# Patient Record
Sex: Male | Born: 1960 | Race: Black or African American | Hispanic: No | Marital: Married | Smoking: Current every day smoker
Health system: Southern US, Community
[De-identification: ages and names within clinical notes are randomized; demographics above are authoritative.]

## PROBLEM LIST (undated history)

## (undated) DIAGNOSIS — C801 Malignant (primary) neoplasm, unspecified: Secondary | ICD-10-CM

## (undated) DIAGNOSIS — I10 Essential (primary) hypertension: Secondary | ICD-10-CM

## (undated) DIAGNOSIS — E785 Hyperlipidemia, unspecified: Secondary | ICD-10-CM

## (undated) DIAGNOSIS — K759 Inflammatory liver disease, unspecified: Secondary | ICD-10-CM

## (undated) DIAGNOSIS — K219 Gastro-esophageal reflux disease without esophagitis: Secondary | ICD-10-CM

## (undated) DIAGNOSIS — F32A Depression, unspecified: Secondary | ICD-10-CM

## (undated) DIAGNOSIS — R0600 Dyspnea, unspecified: Secondary | ICD-10-CM

## (undated) DIAGNOSIS — J449 Chronic obstructive pulmonary disease, unspecified: Secondary | ICD-10-CM

## (undated) HISTORY — PX: CARDIAC CATHETERIZATION: SHX172

## (undated) HISTORY — DX: Inflammatory liver disease, unspecified: K75.9

## (undated) HISTORY — DX: Gastro-esophageal reflux disease without esophagitis: K21.9

## (undated) HISTORY — DX: Chronic obstructive pulmonary disease, unspecified: J44.9

## (undated) MED FILL — Fosaprepitant Dimeglumine For IV Infusion 150 MG (Base Eq): INTRAVENOUS | Qty: 5 | Status: AC

---

## 1983-08-09 HISTORY — PX: WRIST GANGLION EXCISION: SUR520

## 2004-03-12 ENCOUNTER — Ambulatory Visit (HOSPITAL_COMMUNITY): Admission: RE | Admit: 2004-03-12 | Discharge: 2004-03-12 | Payer: Self-pay | Admitting: Pediatrics

## 2004-03-22 ENCOUNTER — Ambulatory Visit (HOSPITAL_COMMUNITY): Admission: RE | Admit: 2004-03-22 | Discharge: 2004-03-22 | Payer: Self-pay | Admitting: *Deleted

## 2004-05-10 ENCOUNTER — Ambulatory Visit (HOSPITAL_COMMUNITY): Admission: RE | Admit: 2004-05-10 | Discharge: 2004-05-10 | Payer: Self-pay | Admitting: Family Medicine

## 2004-05-10 IMAGING — US US ABDOMEN COMPLETE
1 series · 14 of 25 positions shown · non-contrast
Comparison: none

CLINICAL DATA: Elevated liver enzymes.  Alcohol abuse.  
ABDOMINAL ULTRASOUND, [DATE]

[Series 1: unknown · 0.34mm/px · 14 of 67 slices shown]
[im 1/67]
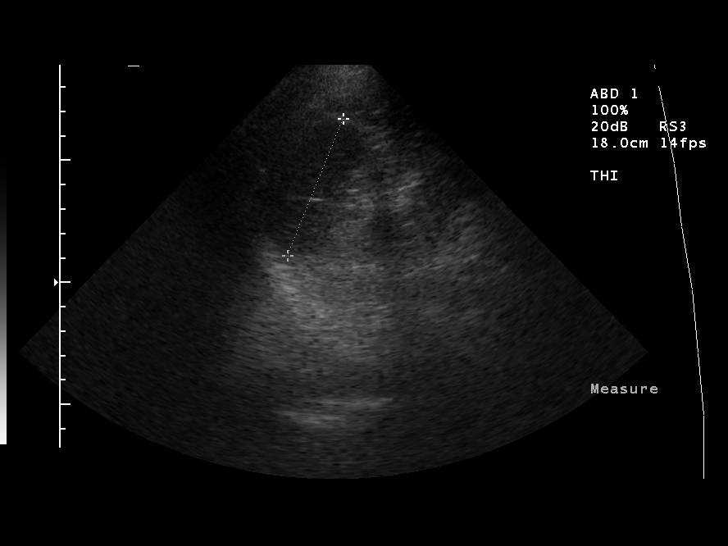
[im 6/67]
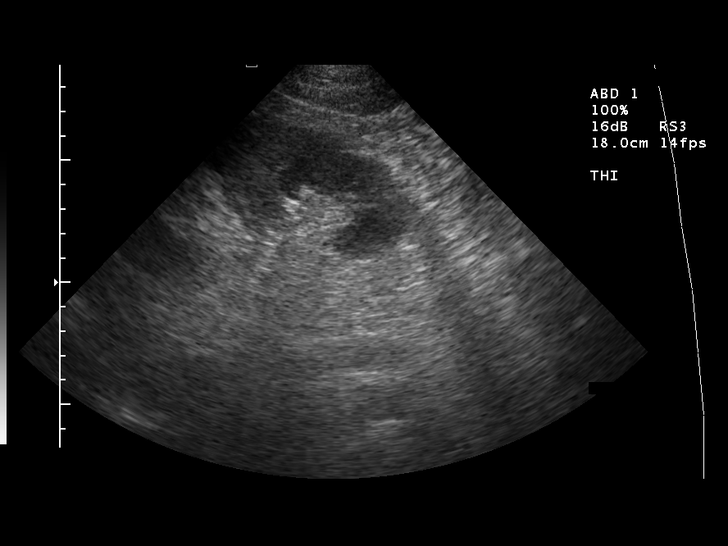
[im 12/67]
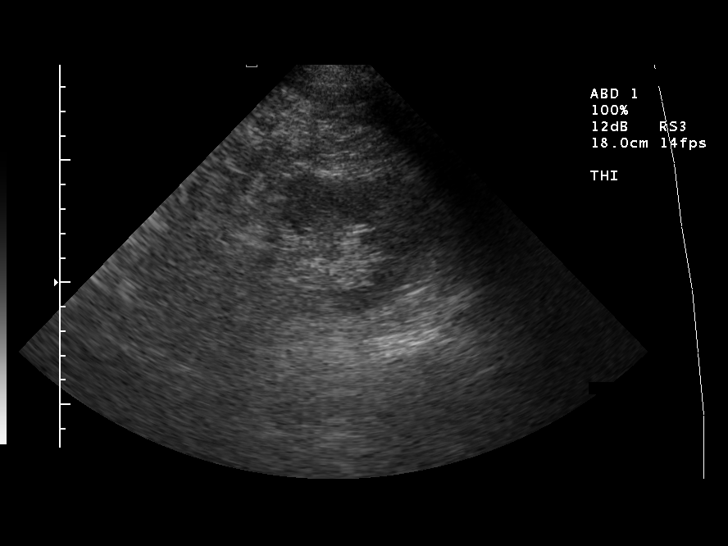
[im 17/67]
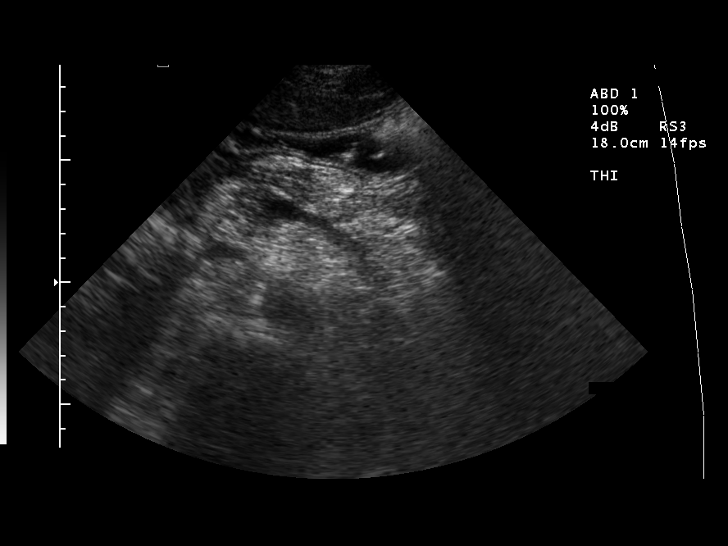
[im 23/67]
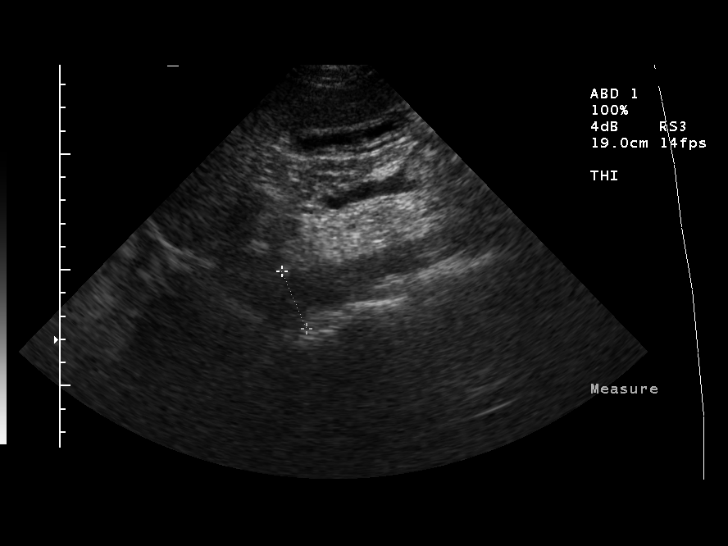
[im 25/67]
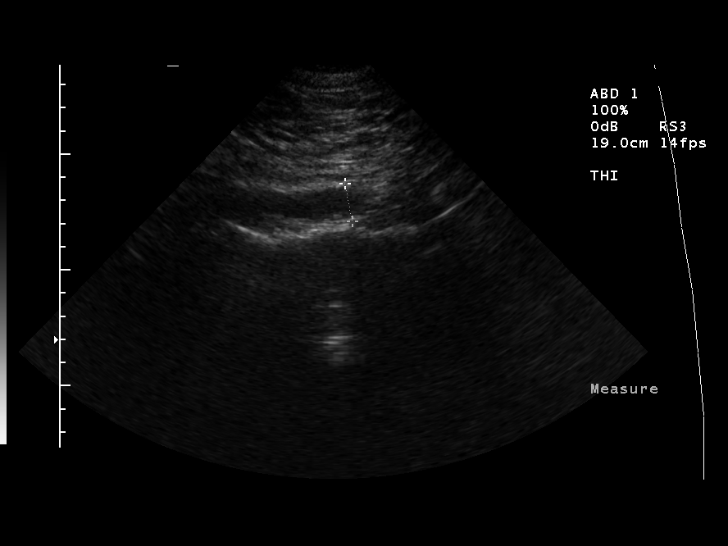
[im 31/67]
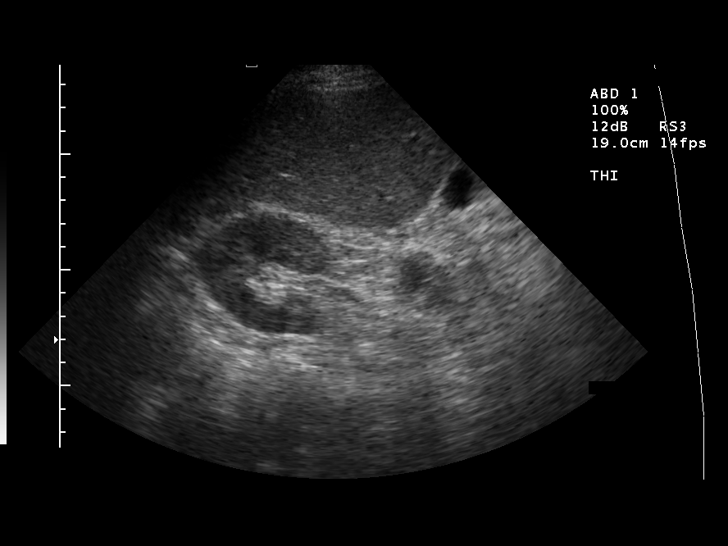
[im 36/67]
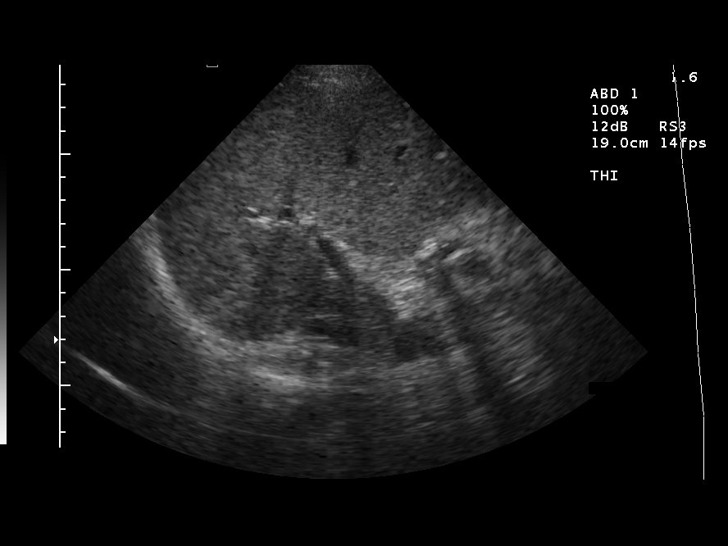
[im 42/67]
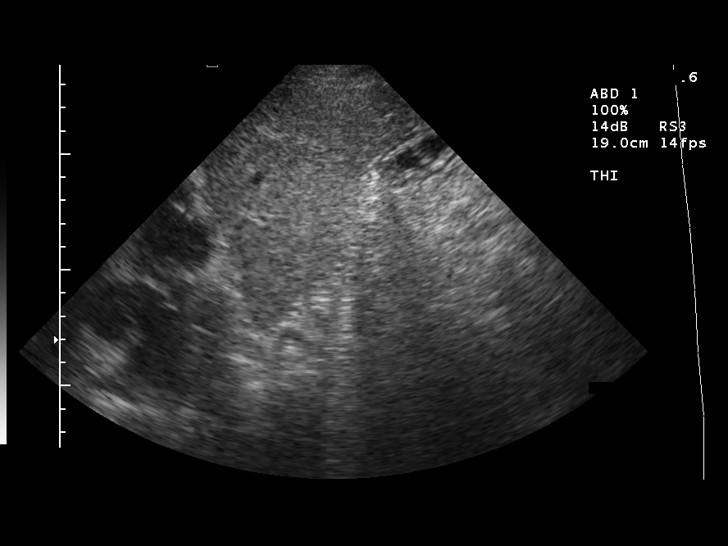
[im 45/67]
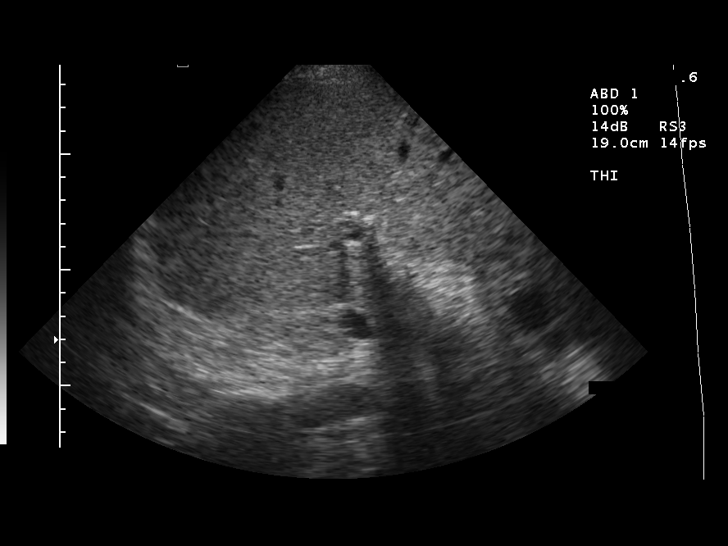
[im 50/67]
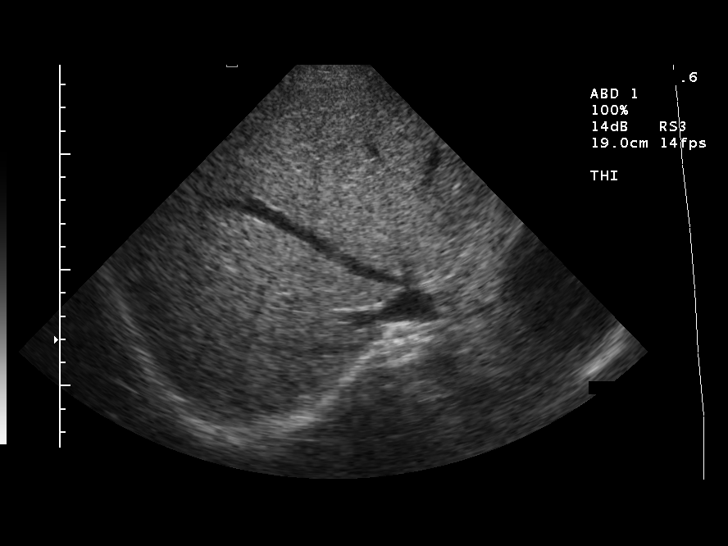
[im 56/67]
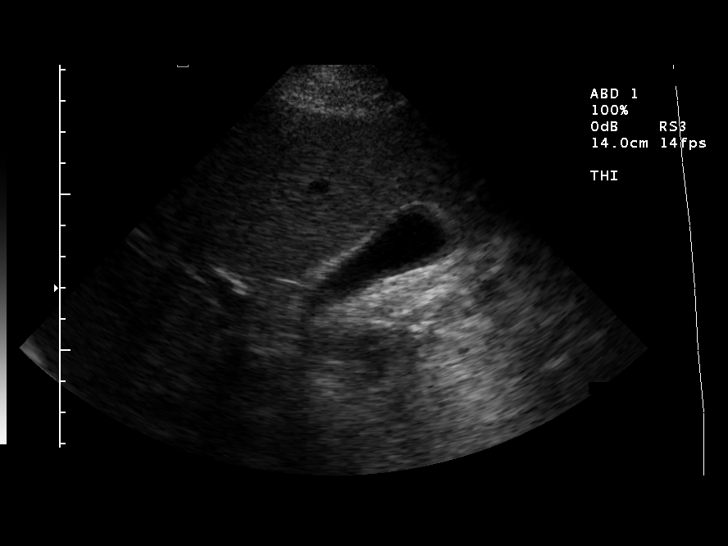
[im 61/67]
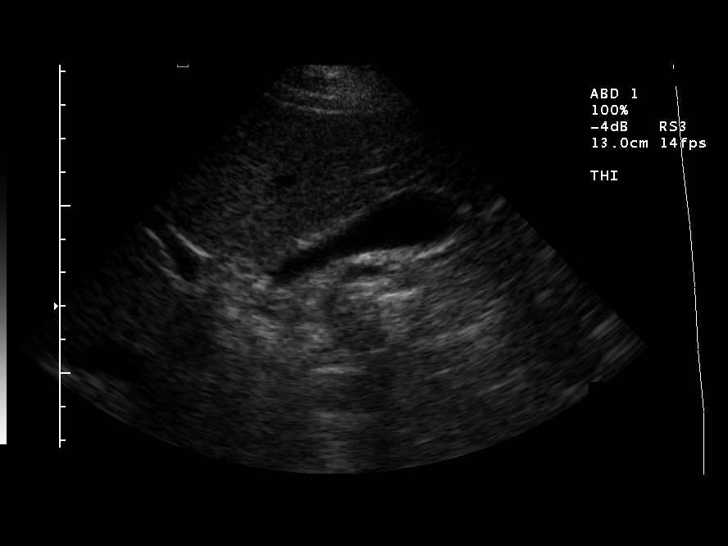
[im 67/67]
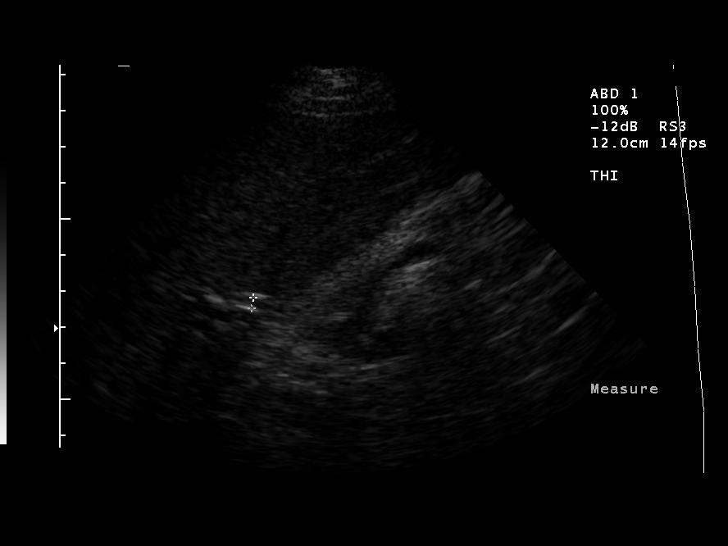

[14 of 25 positions shown; findings below may reference images not displayed]

FINDINGS: The liver is echogenic in appearance consistent with fatty infiltration.  No focal lesions are identified.  No gallstones are identified.  There is no gallbladder wall thickening, pericholecystic fluid or biliary ductal dilatation.  The common bile duct measures 3 mm.  Inferior vena cava and aorta were visualized.  There is no abdominal aortic aneurysm.  Pancreas appears slightly heterogeneous in appearance without definite focal mass.  Pancreatitis could not be excluded.  Further evaluation with CT scan would be recommended.  Spleen appears normal.  Right kidney has a normal appearance measuring 12 cm in length.  Left kidney measured 12.4 cm in length.  Dromedary hump is noted.  
IMPRESSION
Heterogeneous appearance of the pancreas.  Pancreatitis is not excluded.  Further evaluation with CT scan is recommended. 
Fatty infiltration of the liver.

## 2004-05-12 ENCOUNTER — Ambulatory Visit (HOSPITAL_COMMUNITY): Admission: RE | Admit: 2004-05-12 | Discharge: 2004-05-12 | Payer: Self-pay | Admitting: Family Medicine

## 2004-05-12 IMAGING — CT CT ABDOMEN WO/W CM
1 of 5 series · 10 of 32 positions shown, 16 images · IV contrast (CONTRAST)
Comparison: none

HISTORY: Alcohol abuse, elevated LFTs, abdominal pain, diarrhea

[Series 7527: — · axial · 0.61mm/px · z∈[+1399,+1759]mm · 10 of 90 slices shown, 16 images]
[im 9/90  soft-tissue]
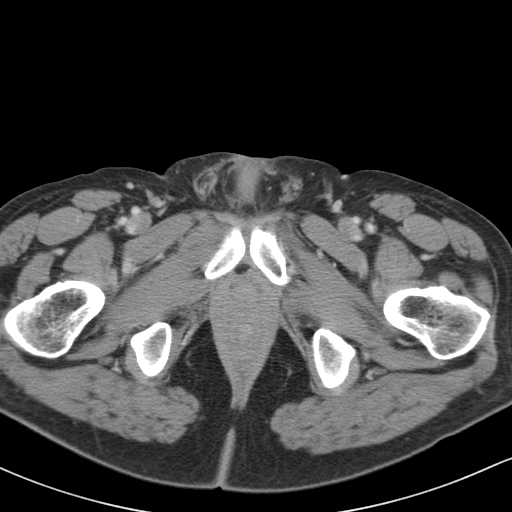
[im 9/90  bone]
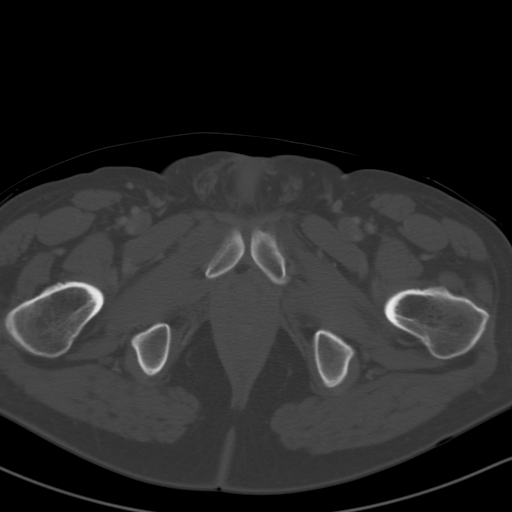
[im 17/90  soft-tissue]
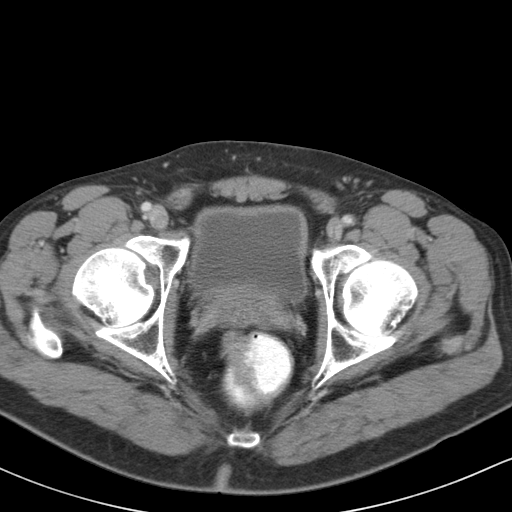
[im 25/90  soft-tissue]
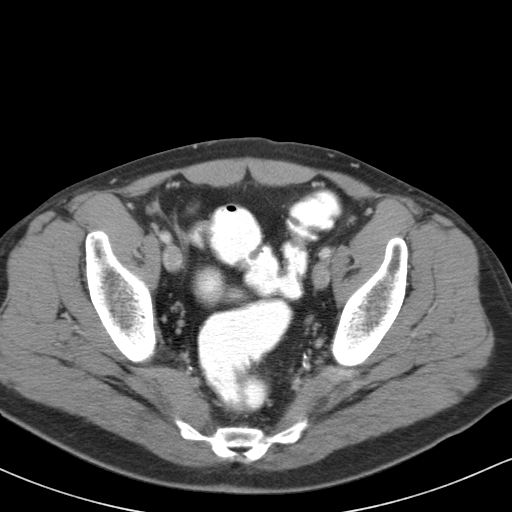
[im 33/90  soft-tissue]
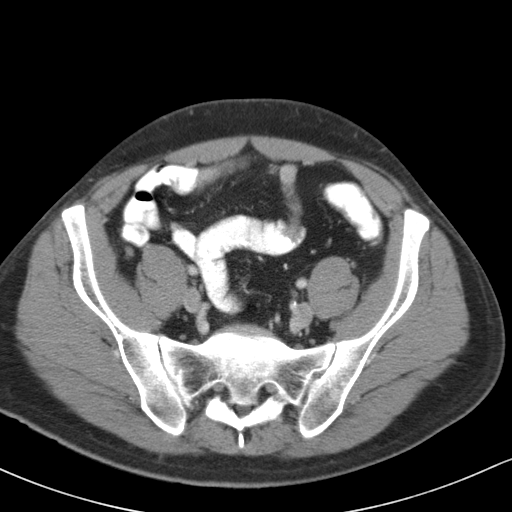
[im 41/90  soft-tissue]
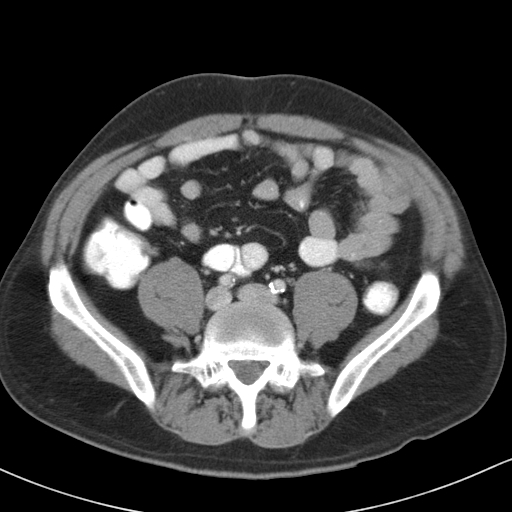
[im 49/90  soft-tissue]
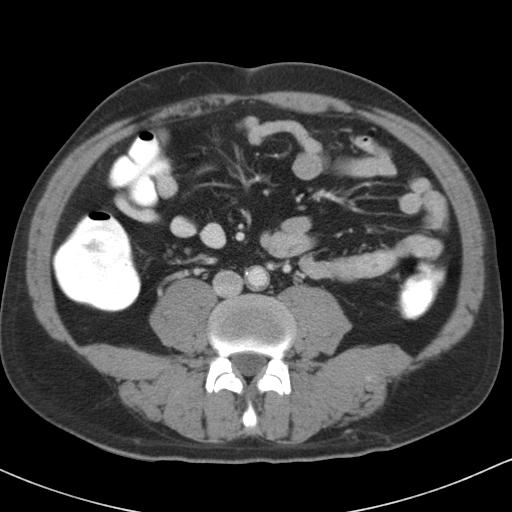
[im 57/90  soft-tissue]
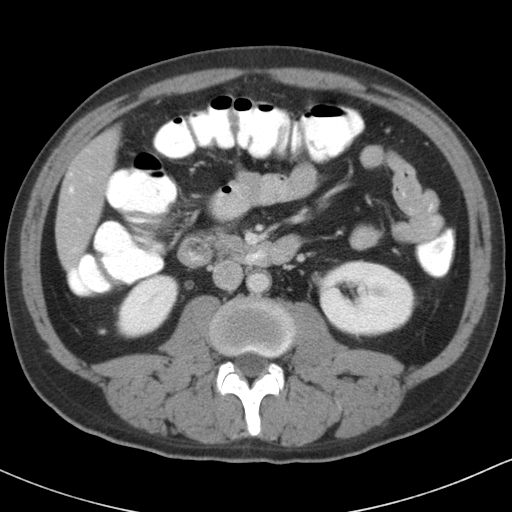
[im 57/90  lung]
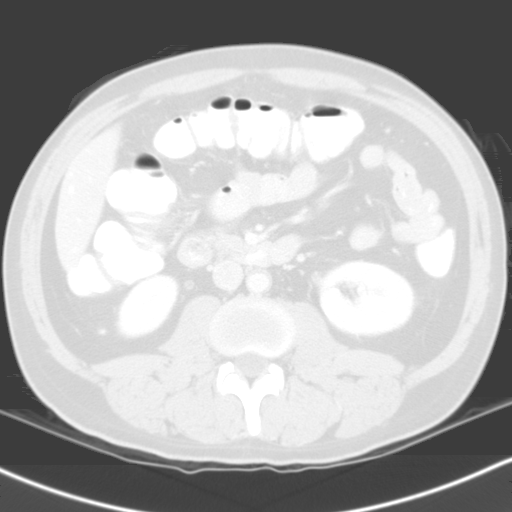
[im 65/90  soft-tissue]
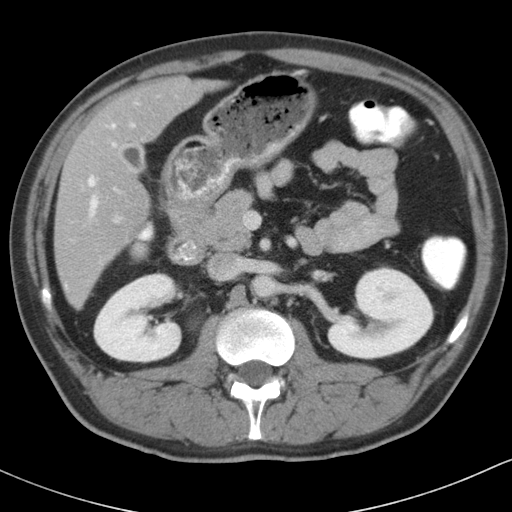
[im 65/90  lung]
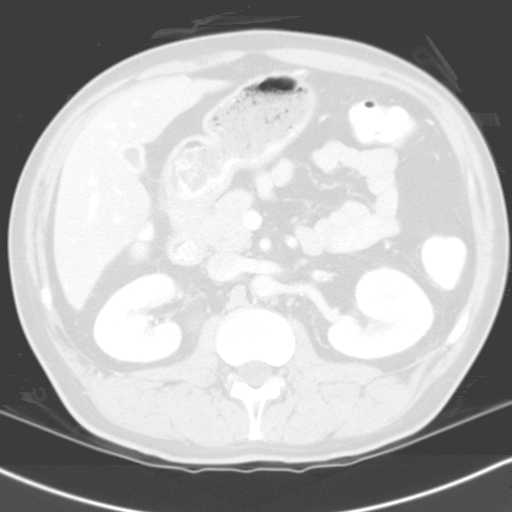
[im 73/90  soft-tissue]
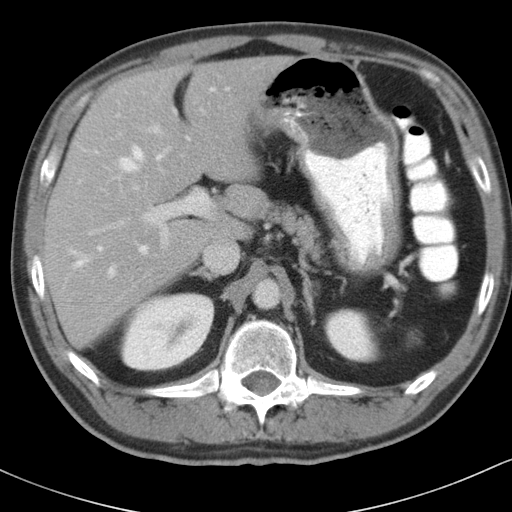
[im 73/90  lung]
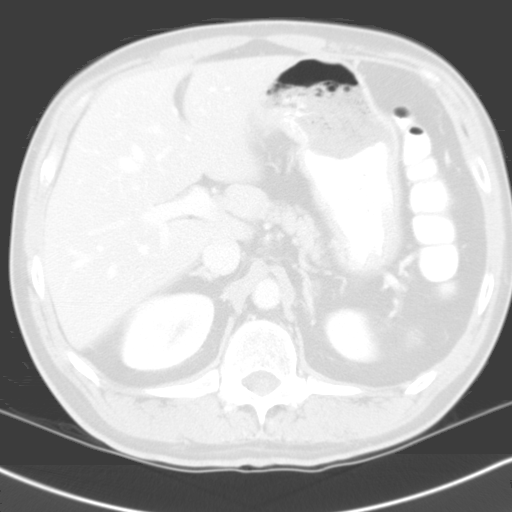
[im 73/90  bone]
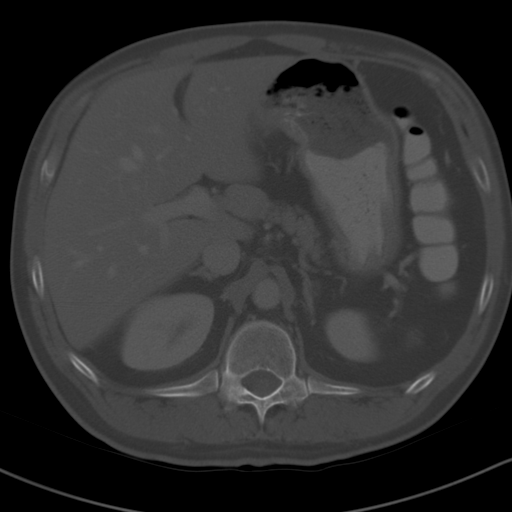
[im 81/90  soft-tissue]
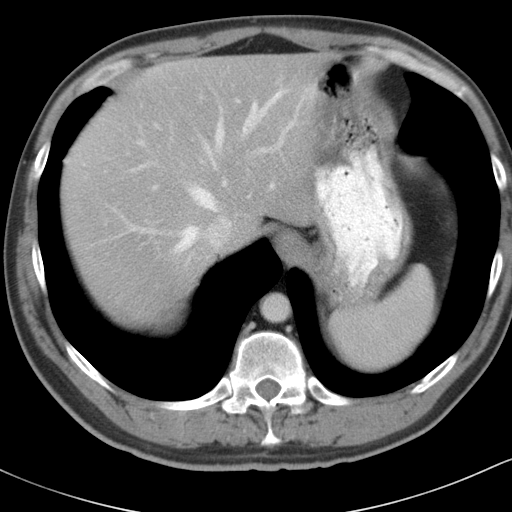
[im 81/90  lung]
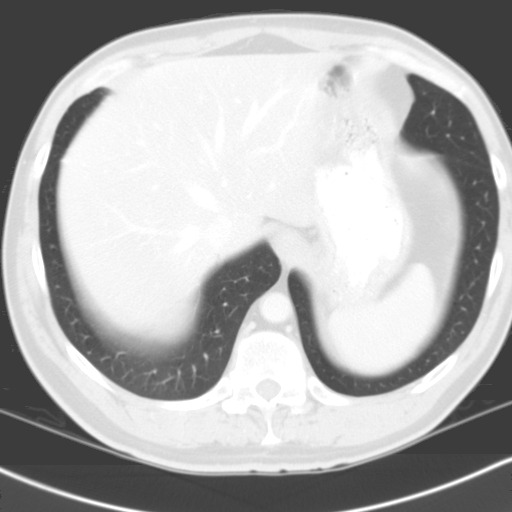

[10 of 32 positions shown; findings below may reference images not displayed]

CT ABDOMEN WITH AND WITHOUT CONTRAST, CT PELVIS WITH CONTRAST

Patient drank dilute oral contrast and preliminary nonenhanced images of the abdomen were
performed. Following 150 cc [IM], multiphasic multidetector helical CT imaging the abdomen
and routine multidetector helical CT imaging of the pelvis performed. No prior studies for
comparison.

CT ABDOMEN:

Lung bases clear.
Mild diffuse fatty infiltration of liver.
Contracted gallbladder.
Liver, spleen, pancreas, kidneys, and adrenal glands otherwise normal appearance.
No upper abdominal mass, adenopathy, free fluid, or inflammatory process.
Bowel loops unremarkable.
IMPRESSION: Mild diffuse fatty infiltration of liver. Otherwise negative exam.

CT PELVIS:

Normal appendix.
No pelvic mass, adenopathy, or free fluid.
Pelvic bowel loops normal.
Mildly enlarged prostate, 3. x 5.2 cm in greatest axial dimensions.
No hernia.
IMPRESSION: Mild prostatic enlargement. No acute abnormalities.

## 2004-05-25 ENCOUNTER — Encounter (INDEPENDENT_AMBULATORY_CARE_PROVIDER_SITE_OTHER): Payer: Self-pay | Admitting: Specialist

## 2004-05-25 ENCOUNTER — Ambulatory Visit (HOSPITAL_COMMUNITY): Admission: RE | Admit: 2004-05-25 | Discharge: 2004-05-25 | Payer: Self-pay | Admitting: Gastroenterology

## 2005-08-08 DIAGNOSIS — J449 Chronic obstructive pulmonary disease, unspecified: Secondary | ICD-10-CM

## 2005-08-08 HISTORY — DX: Chronic obstructive pulmonary disease, unspecified: J44.9

## 2007-03-11 ENCOUNTER — Inpatient Hospital Stay (HOSPITAL_COMMUNITY): Admission: EM | Admit: 2007-03-11 | Discharge: 2007-03-12 | Payer: Self-pay | Admitting: Emergency Medicine

## 2007-03-11 IMAGING — CR DG CHEST 2V
2 series · 2 of 2 positions shown · non-contrast
Comparison: none

CLINICAL DATA: Chest pain

Chest 2 view:
Comparison [DATE]. [DATE] cm nodule in the left midlung on the frontal radiograph,
without definite correlate on the lateral. Lungs otherwise clear, mildly
hyperinflated. Heart size normal. No effusion. Visualized bones unremarkable.

[view not recorded (1 of 2)]
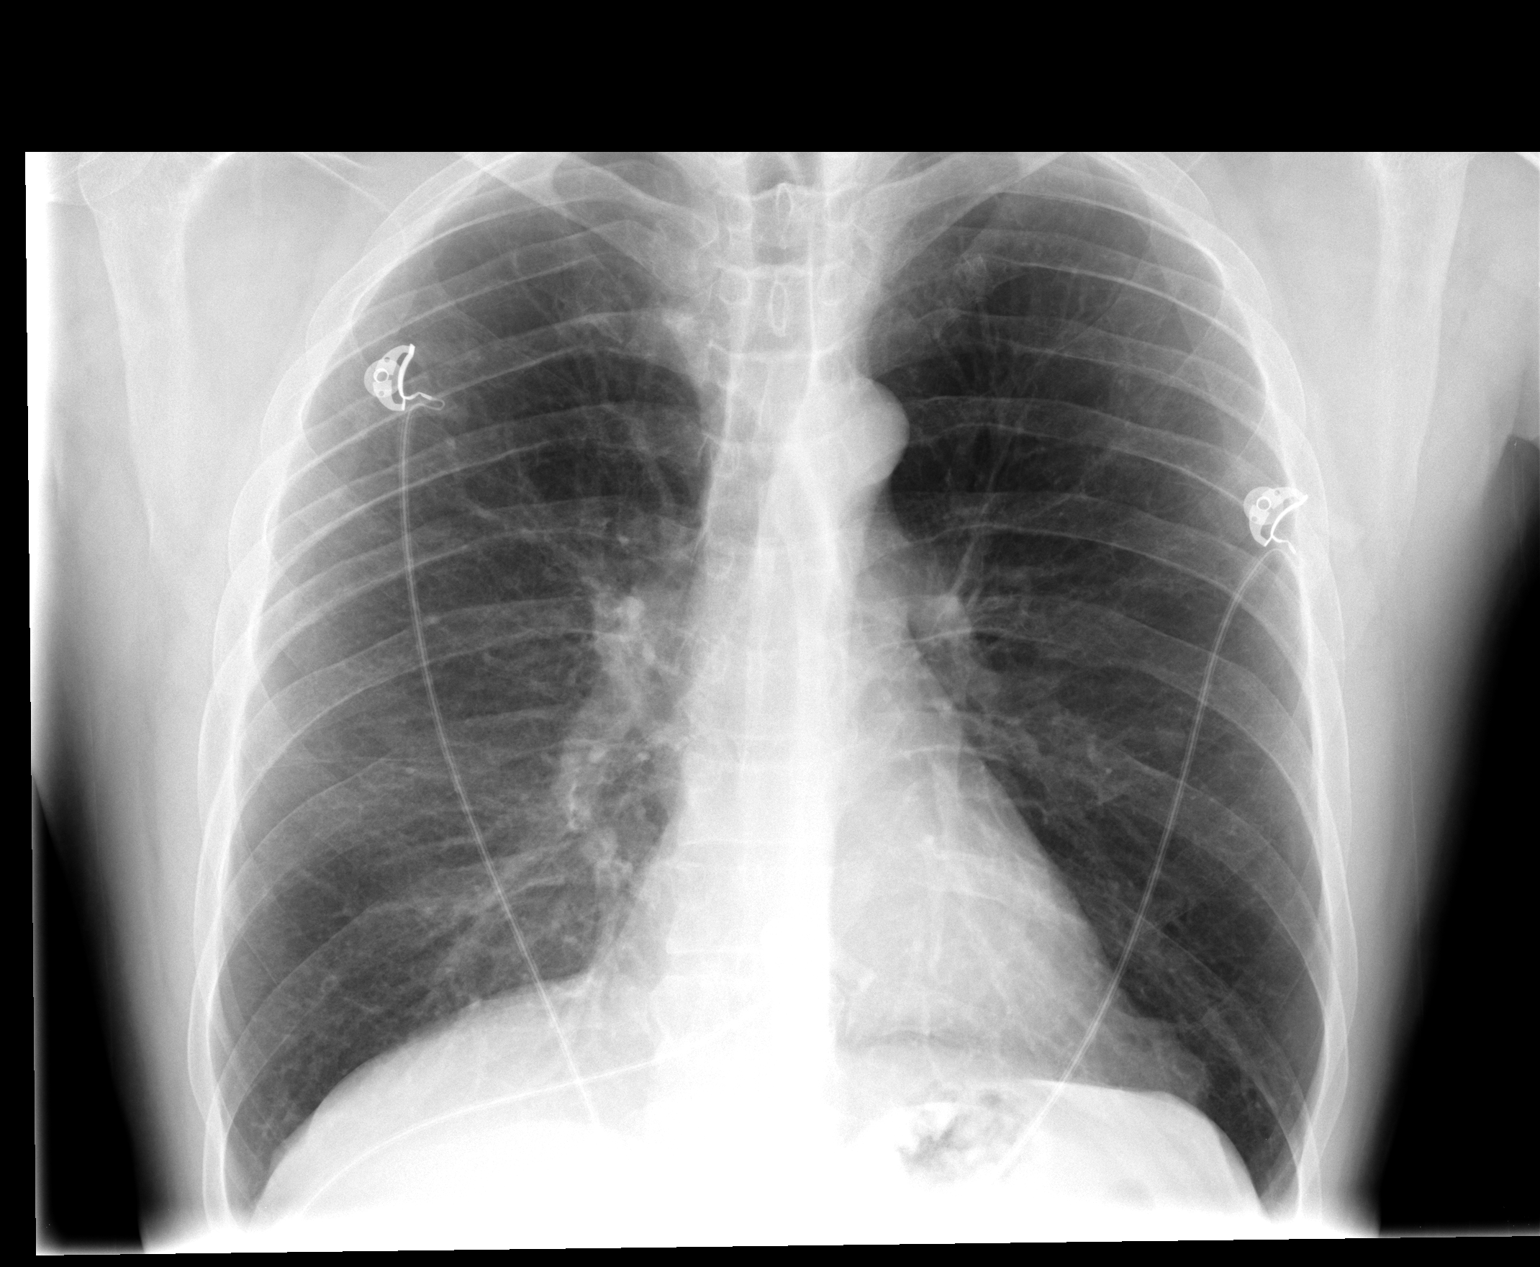

[view not recorded (2 of 2)]
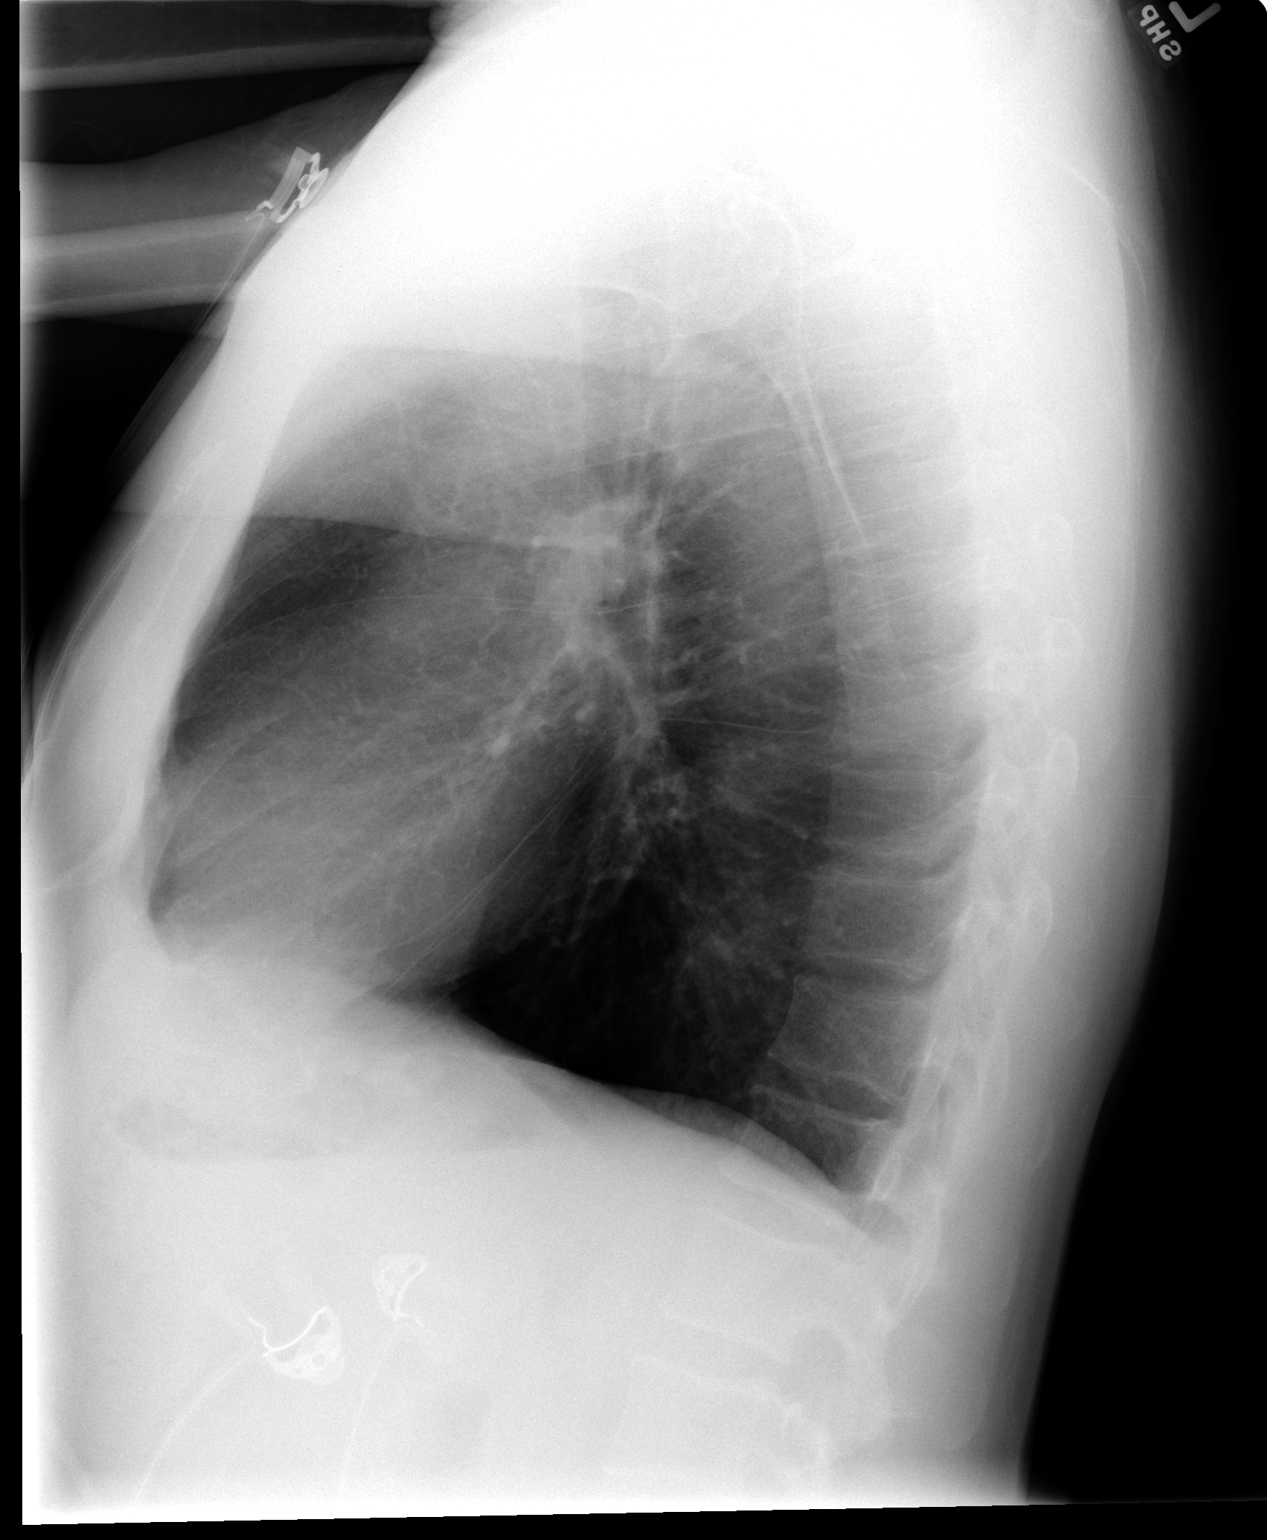

[2 of 2 positions shown; findings below may reference images not displayed]

IMPRESSION: 1. 1 cm left midlung nodule, not evident on previous film. Consider CT to
exclude developing neoplasm.

## 2007-03-11 IMAGING — CT CT CHEST W/ CM
1 series · 15 of 33 positions shown, 19 images · IV contrast (Omnipaque 300)
Comparison: None

CLINICAL DATA: Chest pain and vomiting. Left pulmonary nodule.
TECHNIQUE: Multidetector CT imaging of the chest was performed following the
standard protocol during bolus administration of intravenous contrast.

[Series 2: chestroutine 5.0 b40f · axial · 0.70mm/px · z∈[-294,-4]mm · 15 of 70 slices shown, 19 images]
[im 6/70  mediastinal]
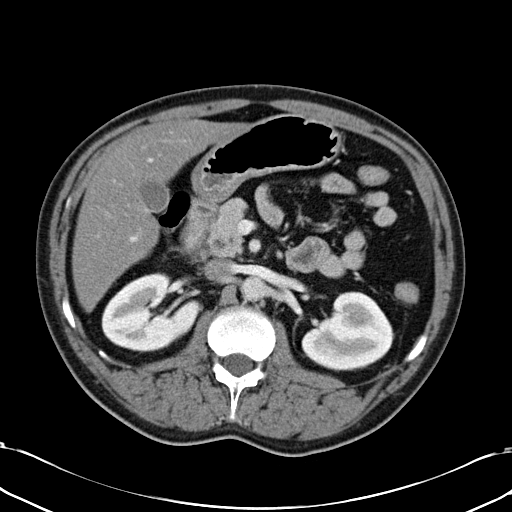
[im 6/70  lung]
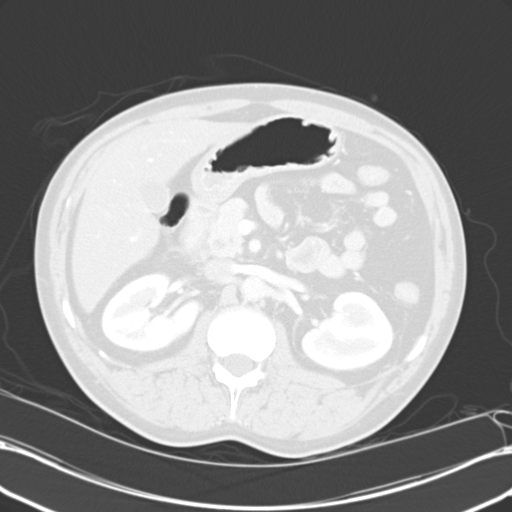
[im 11/70  lung]
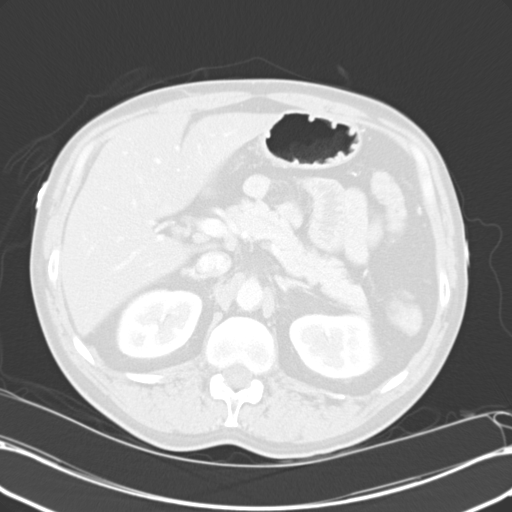
[im 14/70  lung]
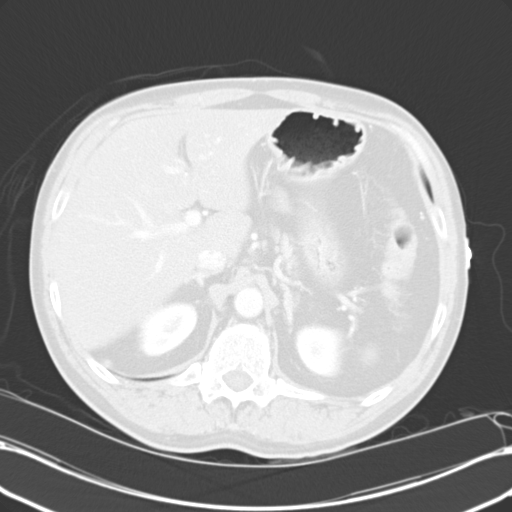
[im 18/70  lung]
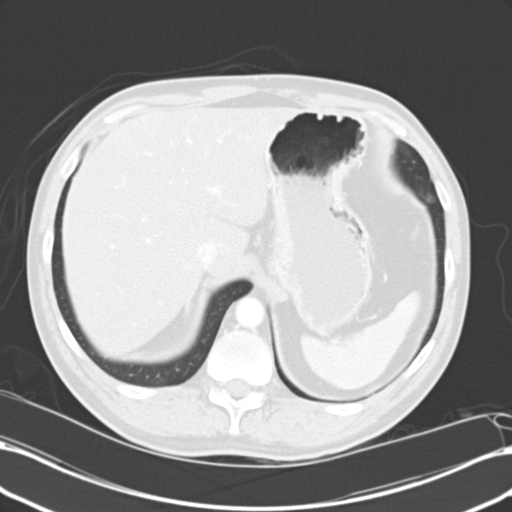
[im 24/70  mediastinal]
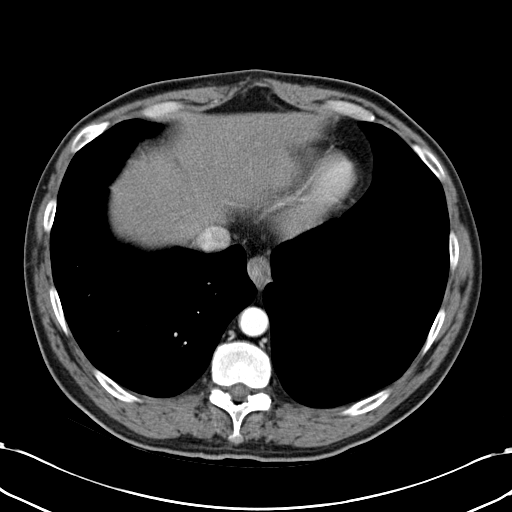
[im 24/70  lung]
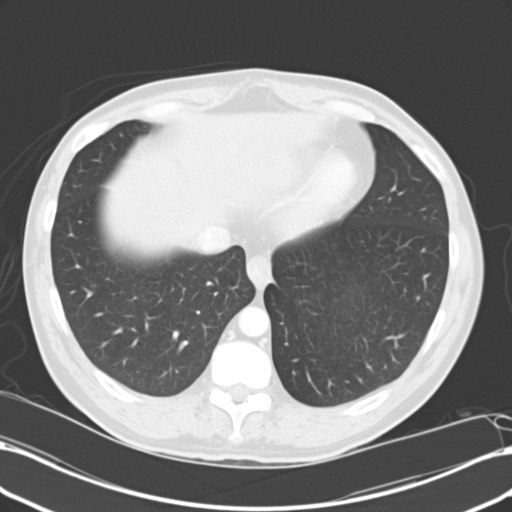
[im 28/70  lung]
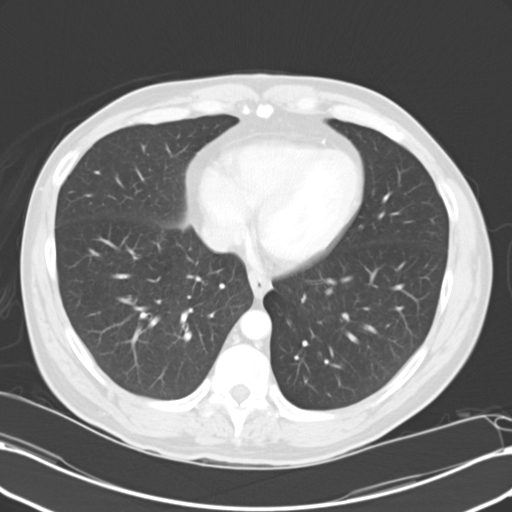
[im 31/70  lung]
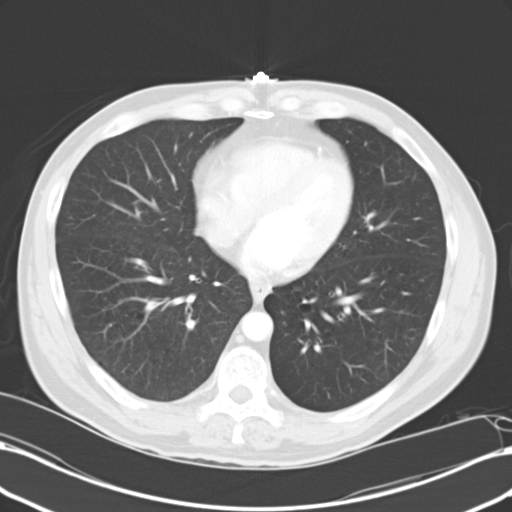
[im 36/70  lung]
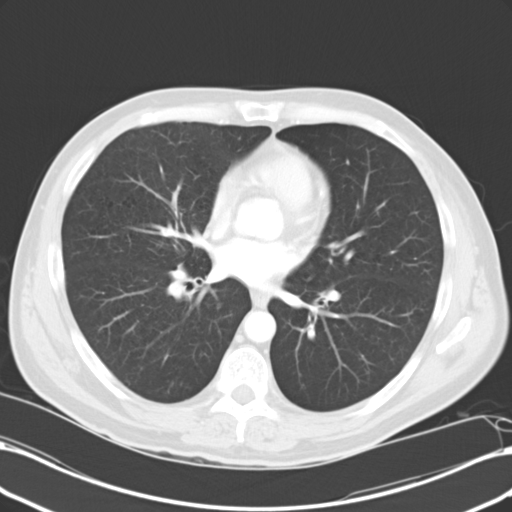
[im 39/70  mediastinal]
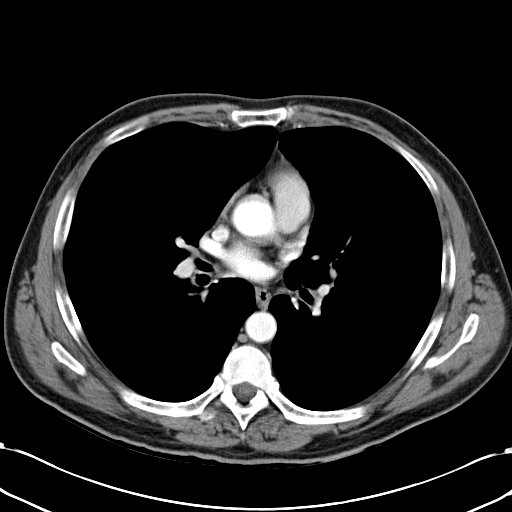
[im 39/70  lung]
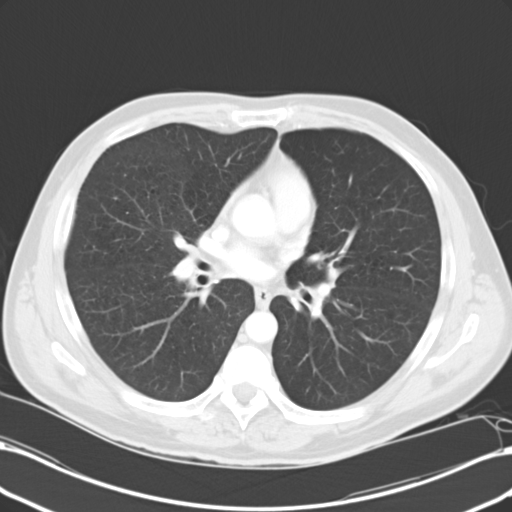
[im 42/70  lung]
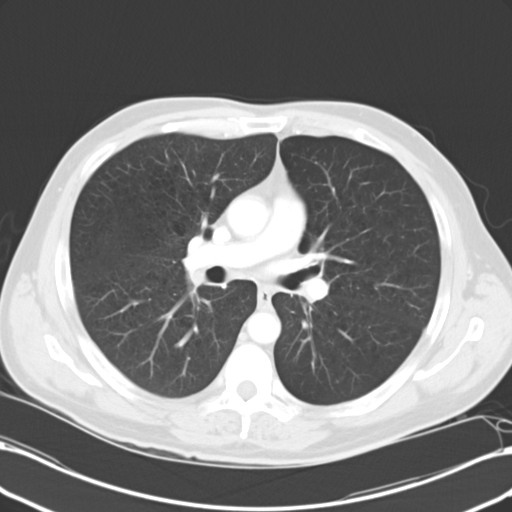
[im 47/70  lung]
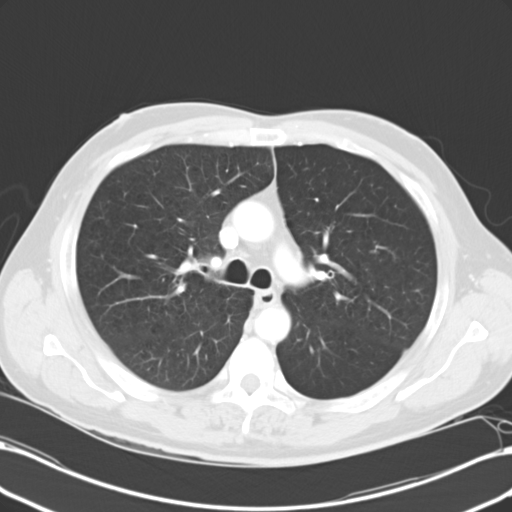
[im 52/70  lung]
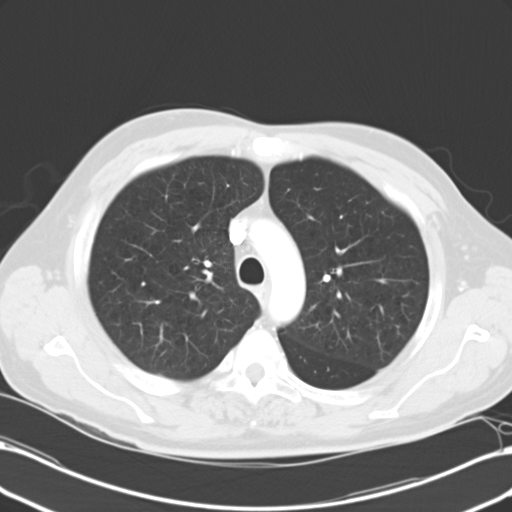
[im 56/70  mediastinal]
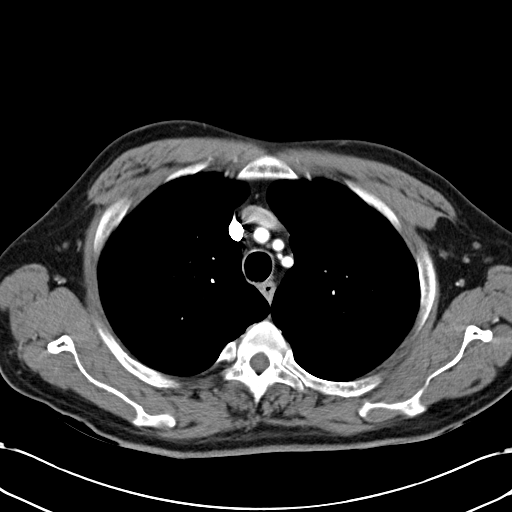
[im 56/70  lung]
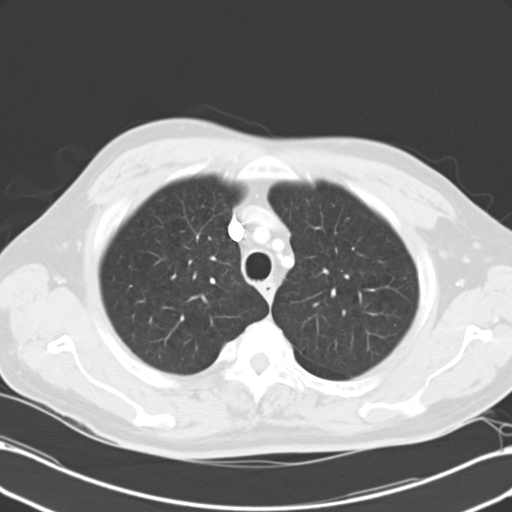
[im 59/70  lung]
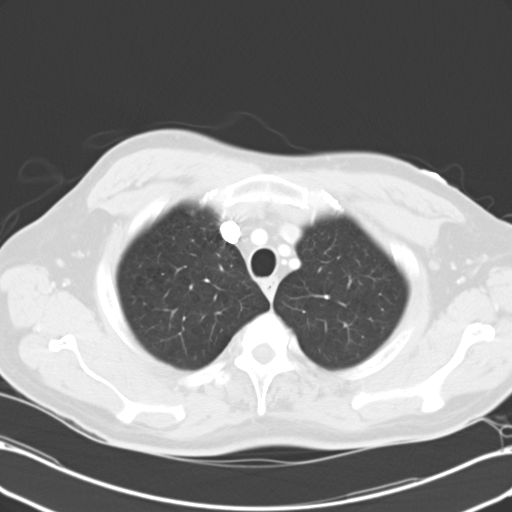
[im 64/70  lung]
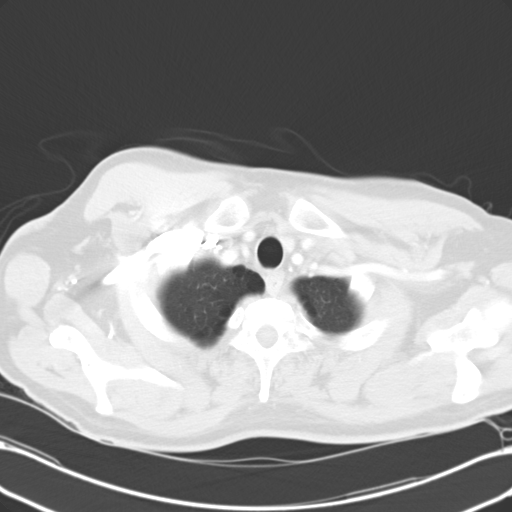

[15 of 33 positions shown; findings below may reference images not displayed]

Contrast:  80 cc Omnipaque 300

CHEST CT WITH CONTRAST:

There is mild right hilar lymphadenopathy. No mediastinal or left hilar
lymphadenopathy. Coronary artery calcification is evident. Heart size is normal.
No pericardial or pleural effusion.

Lung windows demonstrate emphysema. There is no evidence for left lung nodule.
No nodule is seen in the right lung. There is no focal airspace consolidation.

Images including the upper abdomen reveal diffuse fatty infiltration of the
liver. Although incompletely visualized, there is some edema around the proximal
duodenum and pancreatic head, but this area has been incompletely visualized.
IMPRESSION: No evidence for left lung nodule. There is emphysema with mild right hilar
lymphadenopathy. Followup imaging may be indicated to insure that this does not
progress.

Subtle edema/inflammation in the region of the proximal duodenum, although this
area has been incompletely visualized. This may be related to pancreatitis or
duodenitis, but clinical correlation will be required. Consider dedicated
abdominal CT to further evaluate as clinically warranted.

## 2007-07-03 ENCOUNTER — Emergency Department (HOSPITAL_COMMUNITY): Admission: EM | Admit: 2007-07-03 | Discharge: 2007-07-03 | Payer: Self-pay | Admitting: Emergency Medicine

## 2007-07-03 IMAGING — CR DG CHEST 1V PORT
1 series · 1 of 1 positions shown · non-contrast
Comparison: [DATE].

CLINICAL DATA: Chest pain.
 PORTABLE CHEST ? 1 VIEW ? [DATE] ? [B8] HOURS:

[view not recorded]
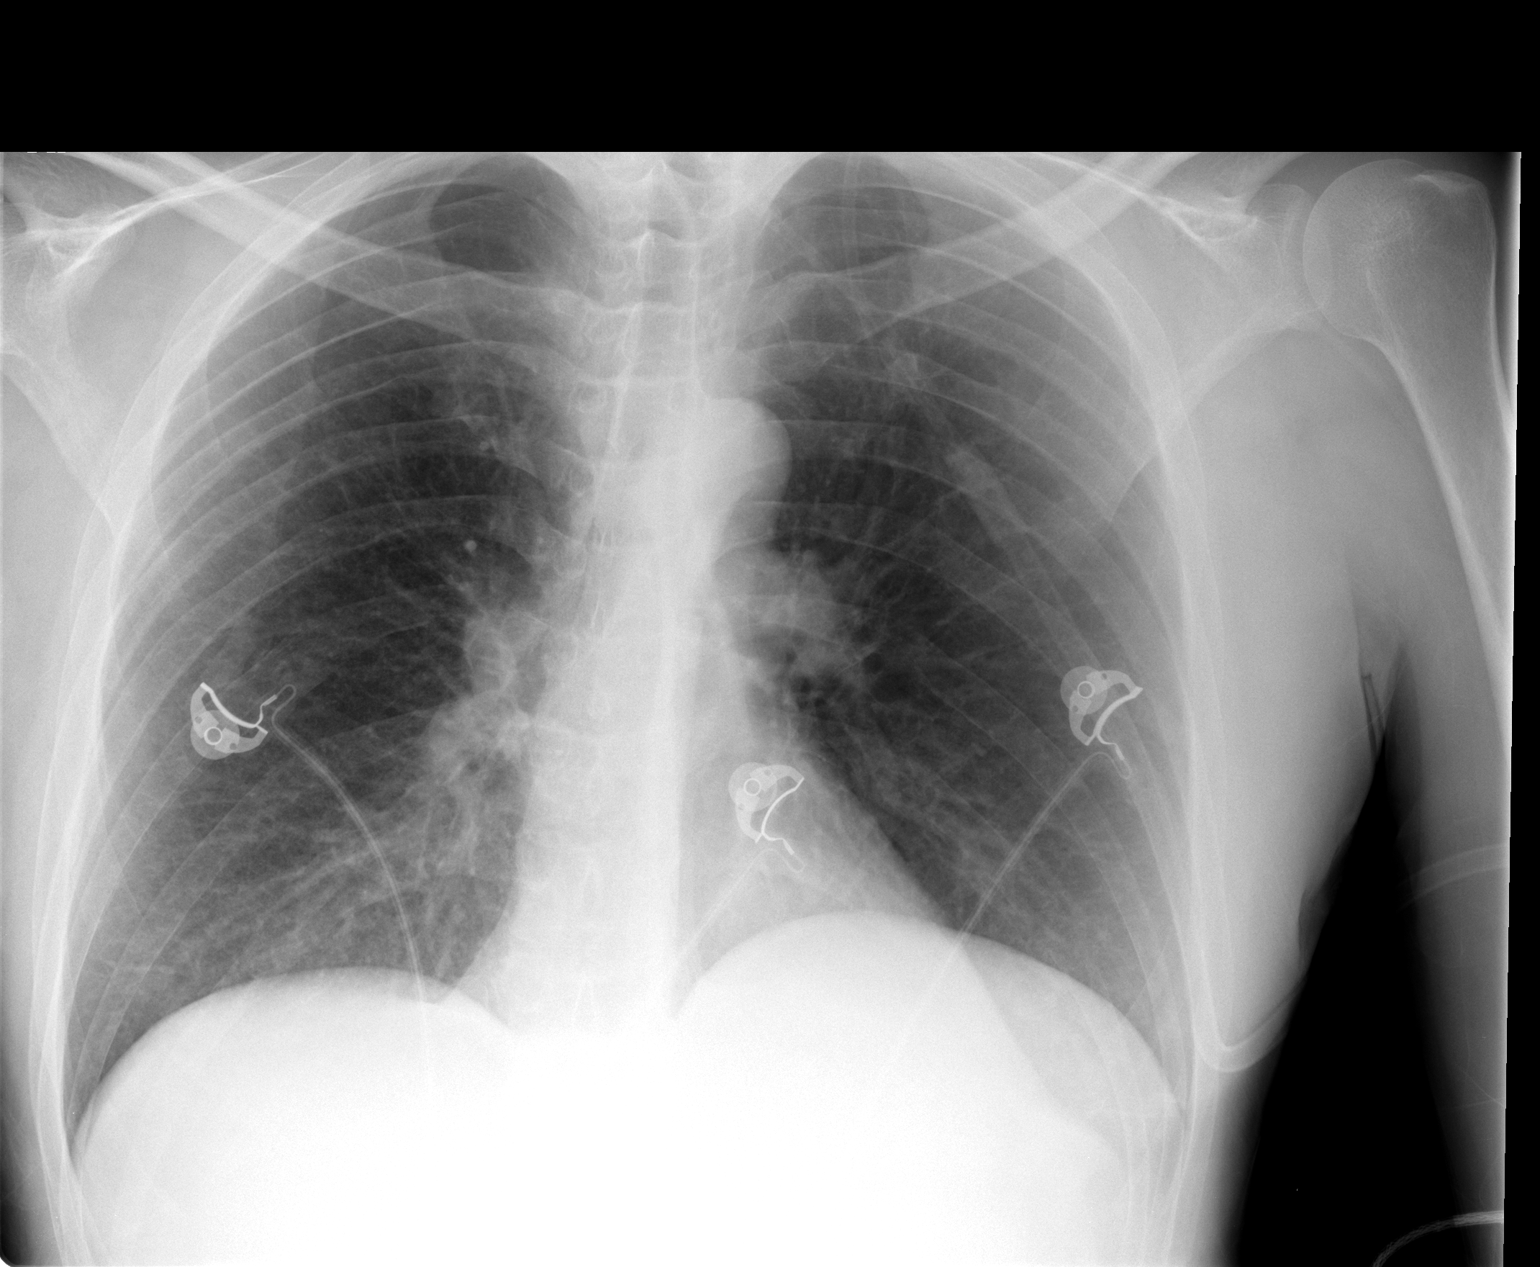

[1 of 1 positions shown; findings below may reference images not displayed]

FINDINGS: The cardiac silhouette, mediastinum and pulmonary vasculature are within normal limits.  Both lungs are clear.
IMPRESSION: Stable chest x-ray.  No evidence of acute cardiac or pulmonary process.

## 2007-07-04 ENCOUNTER — Ambulatory Visit: Payer: Self-pay | Admitting: Gastroenterology

## 2007-08-15 ENCOUNTER — Ambulatory Visit (HOSPITAL_COMMUNITY): Admission: RE | Admit: 2007-08-15 | Discharge: 2007-08-15 | Payer: Self-pay | Admitting: Gastroenterology

## 2007-08-15 IMAGING — CT CT ABDOMEN W/ CM
1 of 3 series · 14 of 32 positions shown, 19 images · IV contrast (Omnipaque 300)
Comparison: none

HISTORY: Nausea, vomiting, epigastric pain

[Series 2: abd_pel 5.0 b40f · axial · 0.63mm/px · z∈[-437,-37]mm · 14 of 92 slices shown, 19 images]
[im 6/92  soft-tissue]
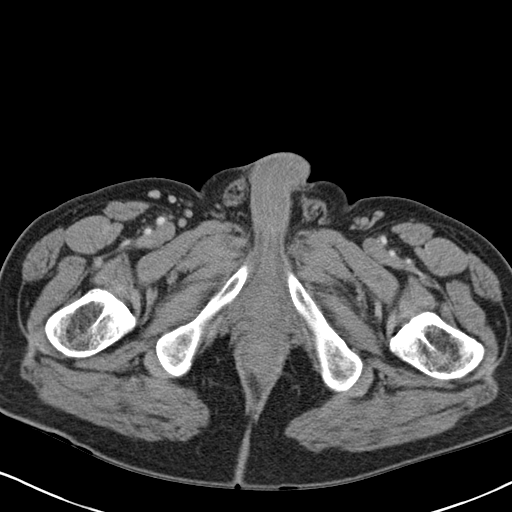
[im 6/92  bone]
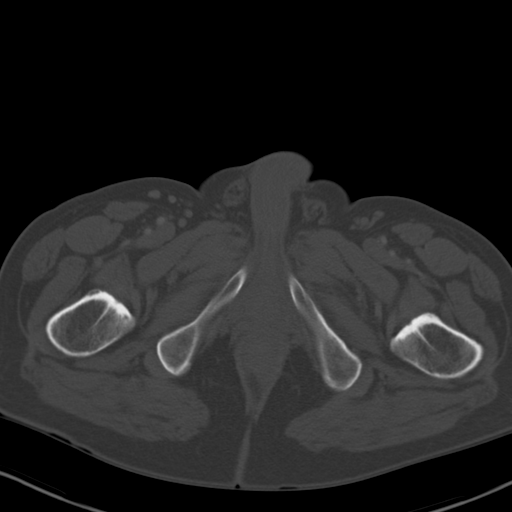
[im 11/92  soft-tissue]
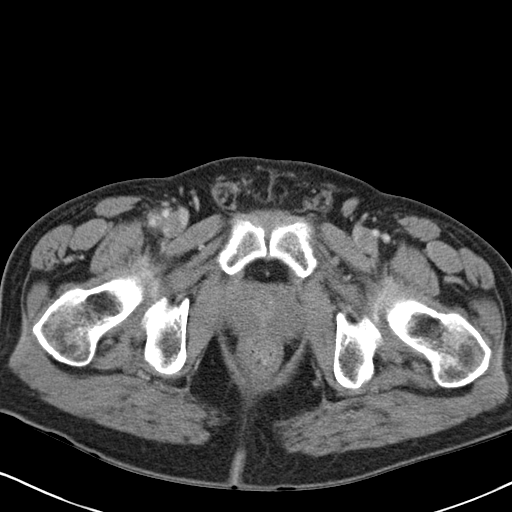
[im 22/92  soft-tissue]
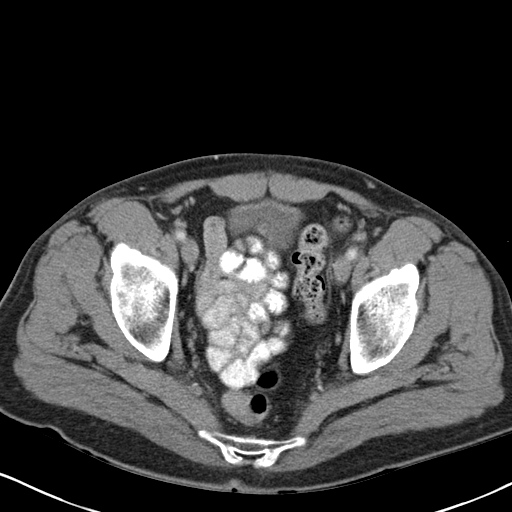
[im 27/92  soft-tissue]
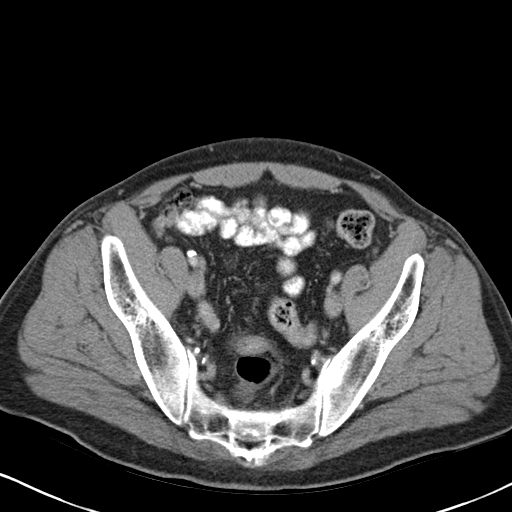
[im 33/92  soft-tissue]
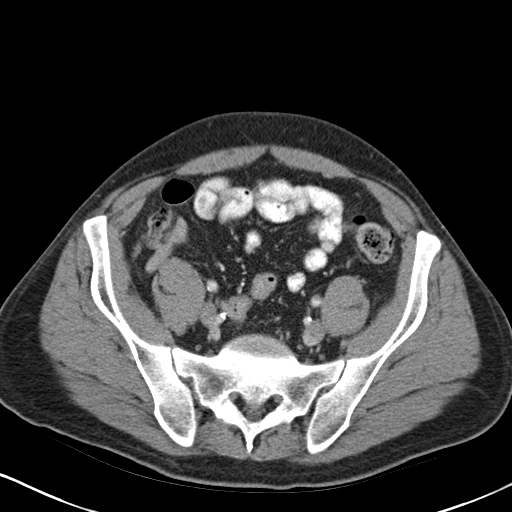
[im 38/92  soft-tissue]
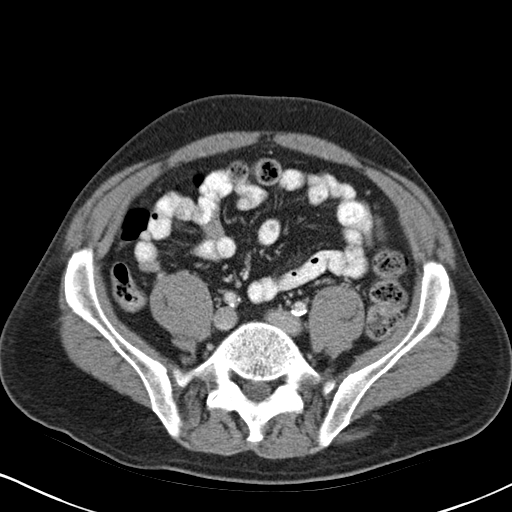
[im 49/92  soft-tissue]
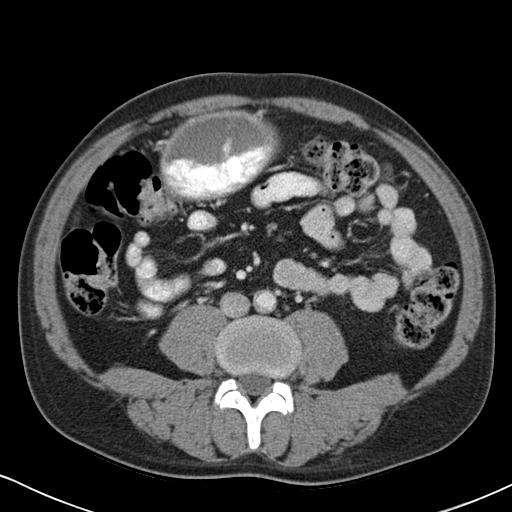
[im 54/92  soft-tissue]
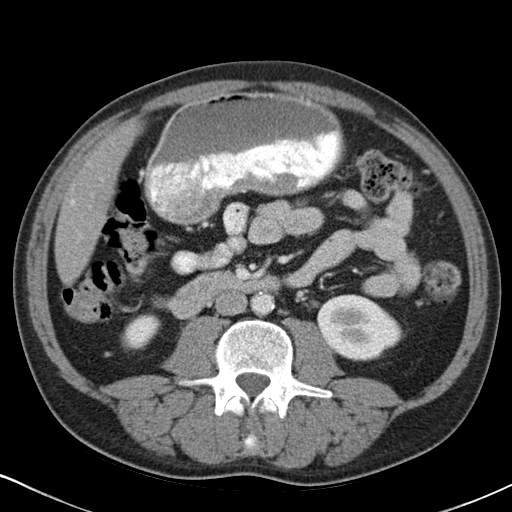
[im 59/92  soft-tissue]
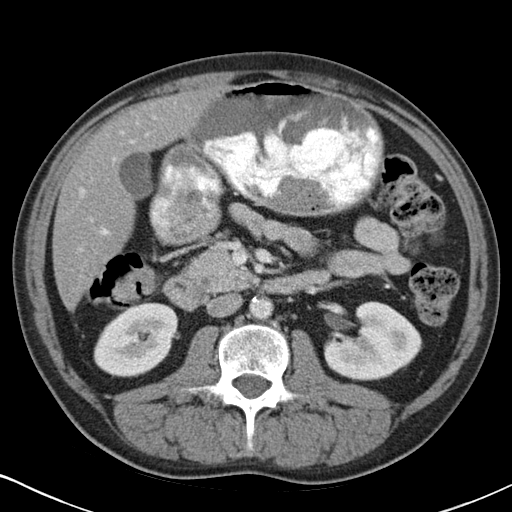
[im 59/92  bone]
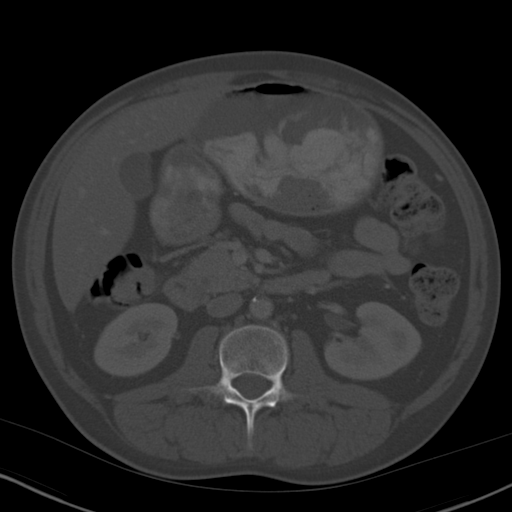
[im 65/92  soft-tissue]
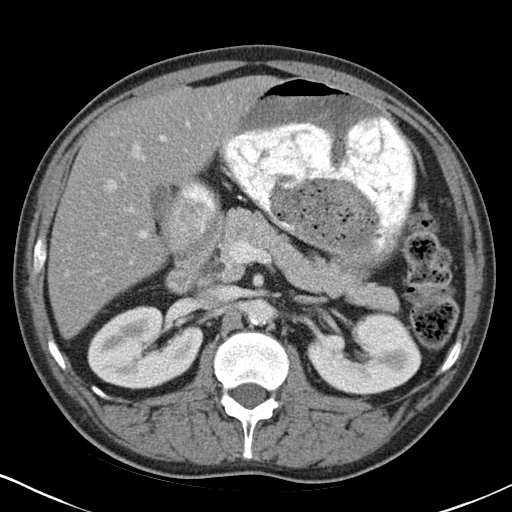
[im 70/92  soft-tissue]
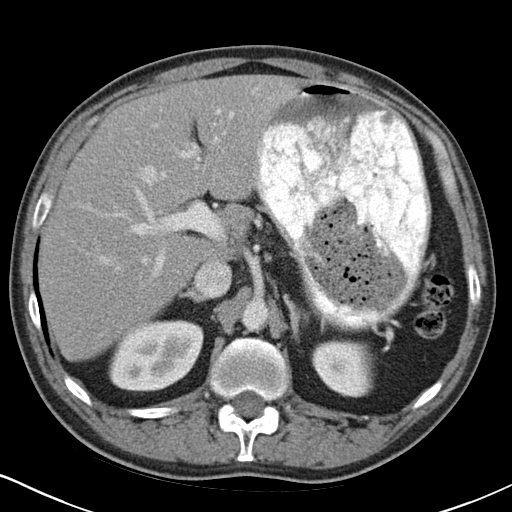
[im 70/92  lung]
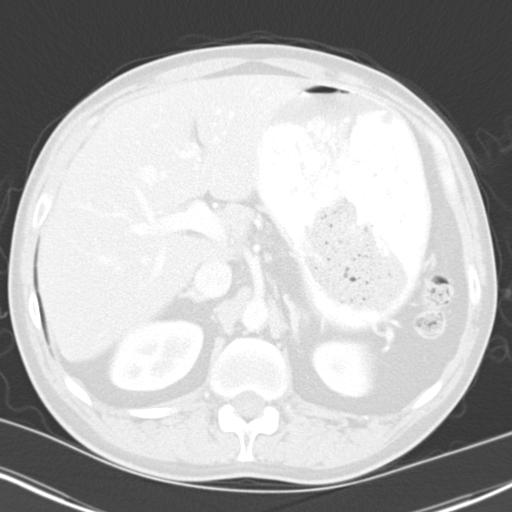
[im 75/92  lung]
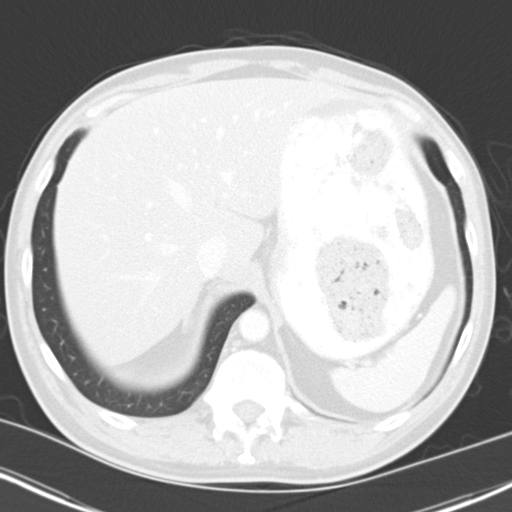
[im 81/92  soft-tissue]
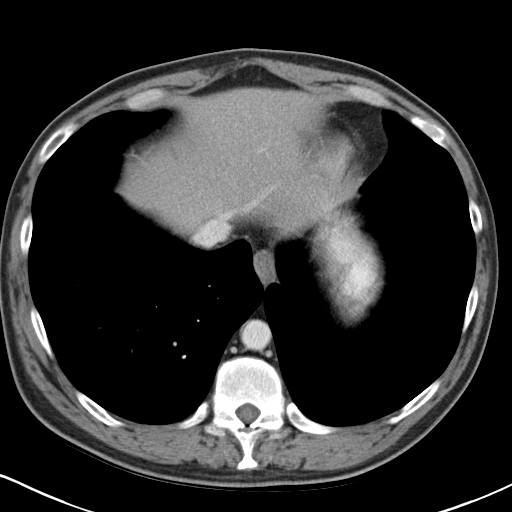
[im 81/92  lung]
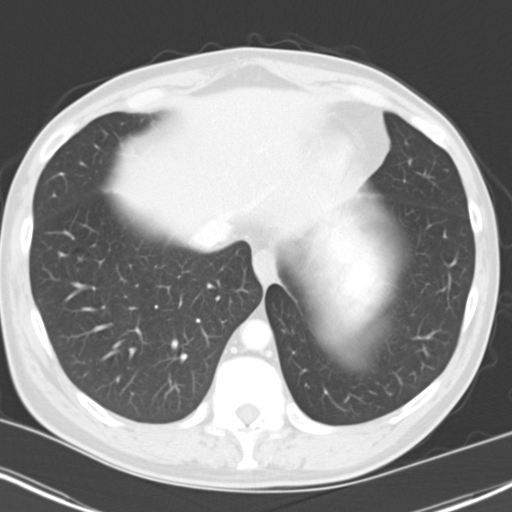
[im 86/92  soft-tissue]
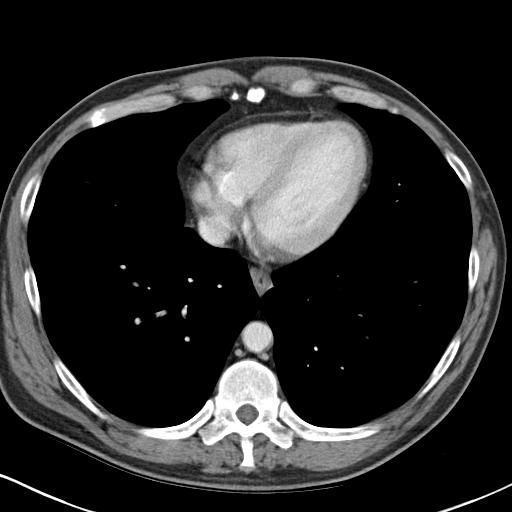
[im 86/92  lung]
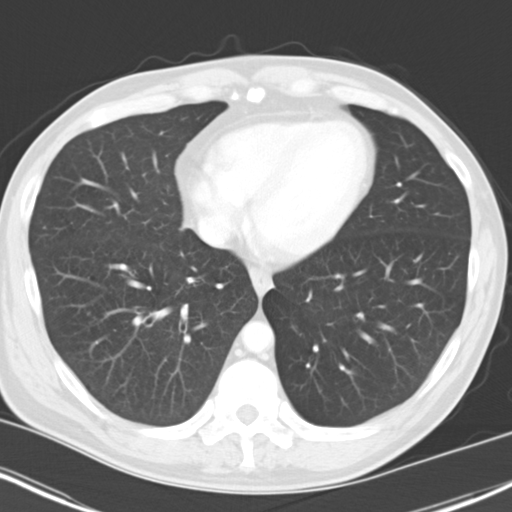

[14 of 32 positions shown; findings below may reference images not displayed]

CT ABDOMEN AND PELVIS WITH CONTRAST:

Multidetector helical CT imaging abdomen and pelvis performed.
Sagittal and coronal images are reconstructed from the axial data set.
Exam utilized dilute oral contrast and 100 cc [RR].
Comparison [DATE]

CT ABDOMEN:

Lung bases clear.
Mild diffuse fatty infiltration of liver.
Liver, spleen, pancreas, kidneys, and adrenal glands otherwise normal
appearance.
Stomach distended by combination of food debris and contrast.
No upper abdominal mass, adenopathy, free fluid, or inflammatory process. 
Stool throughout colon.
Scattered atherosclerotic calcifications without aneurysm.
IMPRESSION: Minimal fatty infiltration of liver.
Mild nonspecific distention of stomach by combination of food debris and
contrast.

CT PELVIS:

Normal appendix.
Normal appearing bladder and distal ureters.
Mild prostatic enlargement, gland measuring 5.3 x 3.8 x 3.0 cm.
Bones unremarkable.
No pelvic mass, adenopathy, or free fluid.
Pelvic bowel loops normal appearance.
IMPRESSION: Mild prostatic enlargement.

## 2007-08-23 ENCOUNTER — Ambulatory Visit: Payer: Self-pay | Admitting: Gastroenterology

## 2007-10-28 ENCOUNTER — Ambulatory Visit: Payer: Self-pay | Admitting: Gastroenterology

## 2007-10-28 ENCOUNTER — Observation Stay (HOSPITAL_COMMUNITY): Admission: EM | Admit: 2007-10-28 | Discharge: 2007-10-29 | Payer: Self-pay | Admitting: Emergency Medicine

## 2007-10-28 IMAGING — CR DG CHEST 1V PORT
1 series · 1 of 1 positions shown · non-contrast
Comparison: [DATE].

CLINICAL DATA: Nausea and vomiting, diarrhea, lower back pain, fever. 
 PORTABLE CHEST - 1 VIEW ? [DATE] ([1O] HOURS):

[view not recorded]
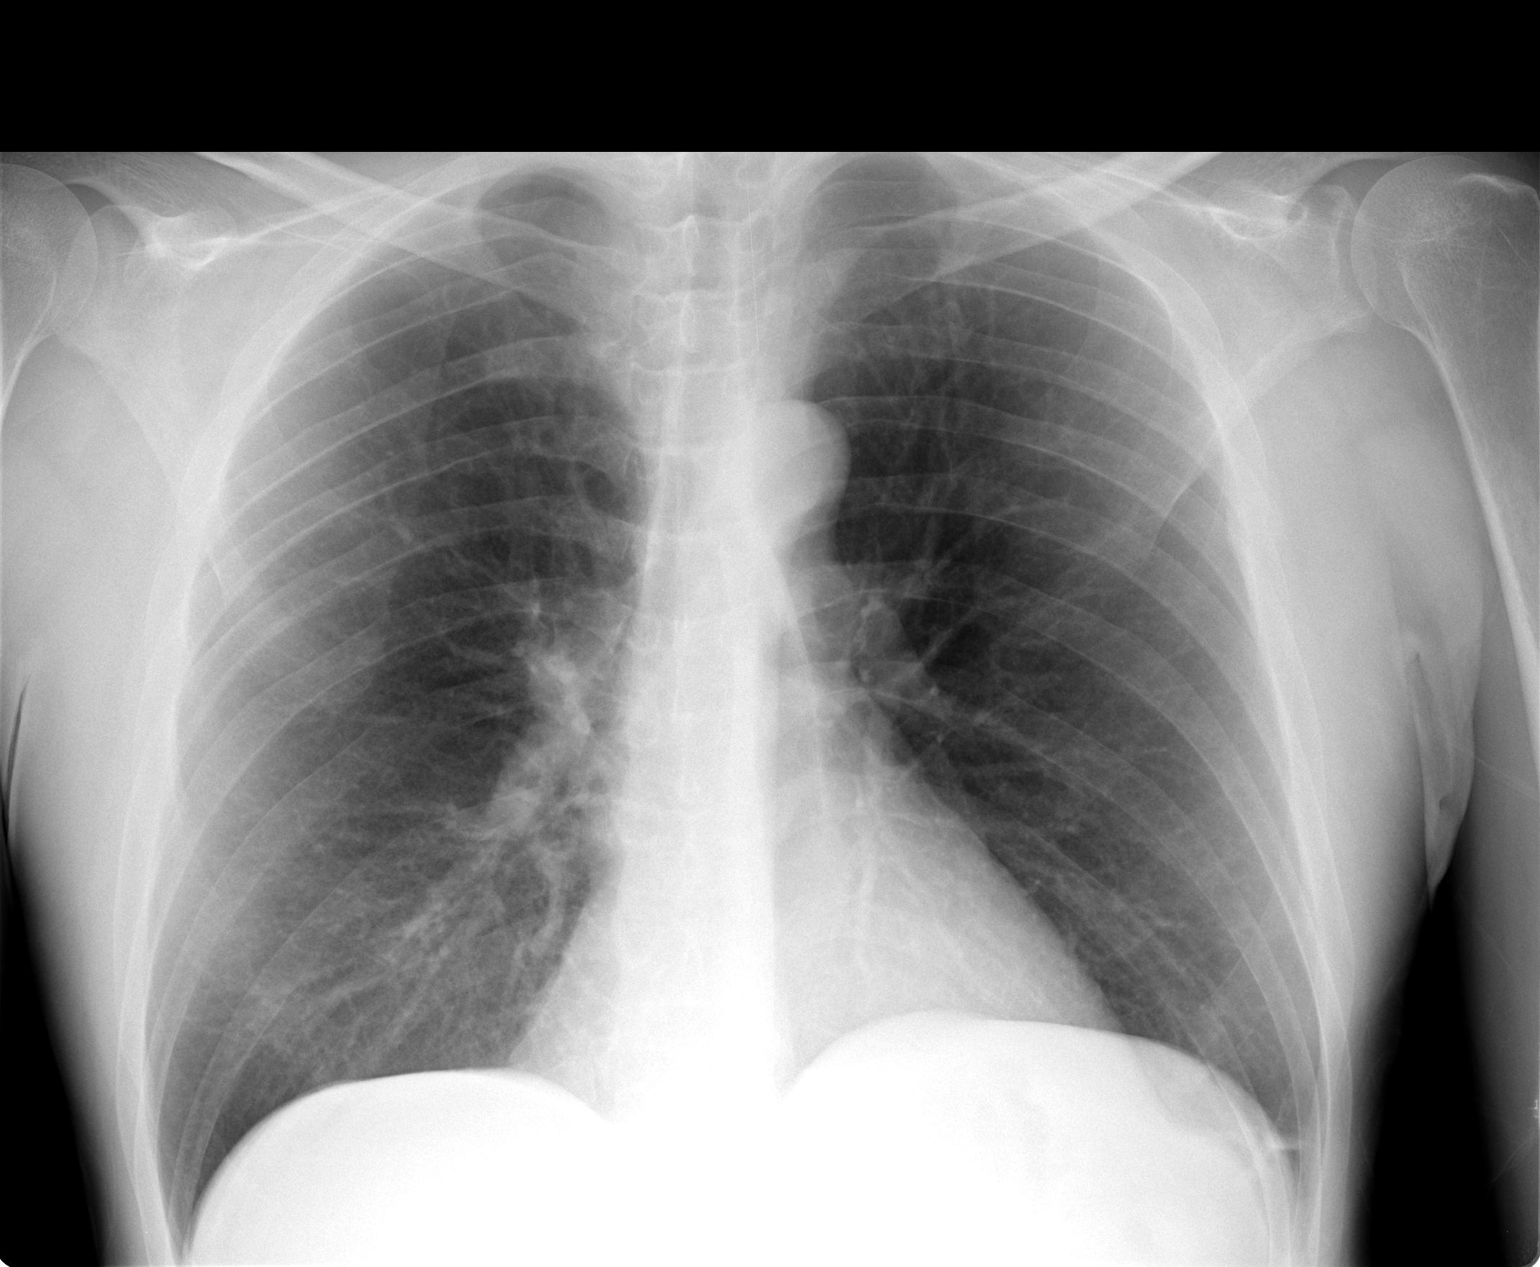

[1 of 1 positions shown; findings below may reference images not displayed]

FINDINGS: The lungs are clear and hyperaerated.  The heart is within upper limits of normal.  No bony abnormality is seen.
IMPRESSION: No active lung disease.

## 2007-10-28 IMAGING — CT CT ABDOMEN W/ CM
1 of 3 series · 14 of 32 positions shown, 19 images · IV contrast (omnipaque)
Comparison: Prior CT of [DATE]

CLINICAL DATA: Upper abdominal pain for several weeks.  
 ABDOMEN CT WITH CONTRAST:
TECHNIQUE: Multidetector CT imaging of the abdomen was performed following the standard protocol during bolus administration of intravenous contrast.
 Contrast:  100 cc Omnipaque 300
TECHNIQUE: Multidetector CT imaging of the pelvis was performed following the standard protocol during bolus administration of intravenous contrast.

[Series 2: abd_pel 5.0 b40f · axial · 0.69mm/px · z∈[+737,+1107]mm · 14 of 84 slices shown, 19 images]
[im 5/84  soft-tissue]
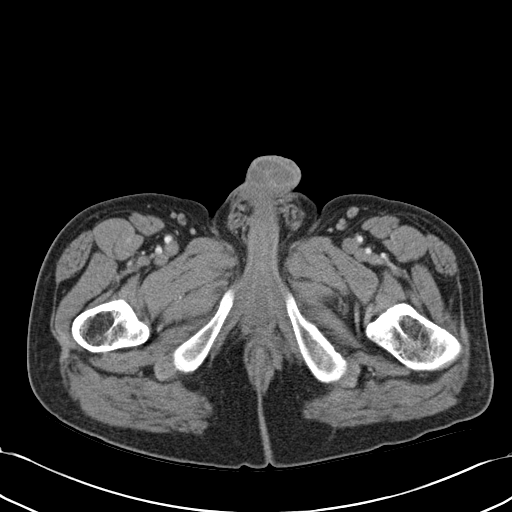
[im 5/84  bone]
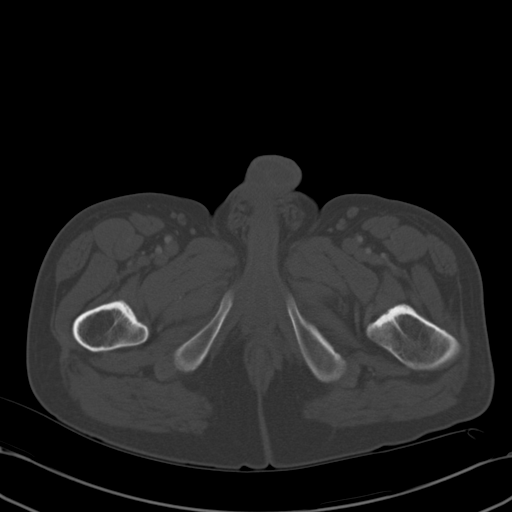
[im 14/84  soft-tissue]
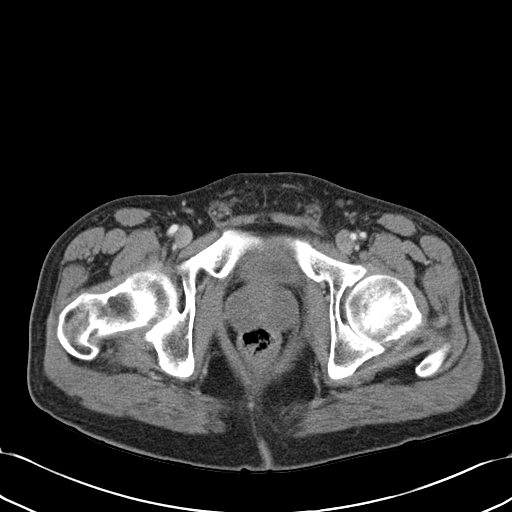
[im 18/84  soft-tissue]
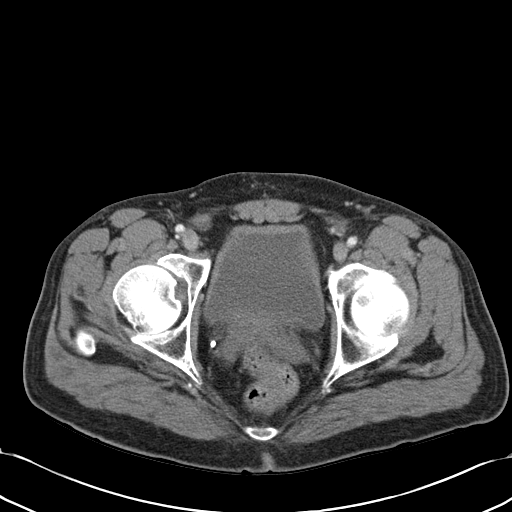
[im 22/84  soft-tissue]
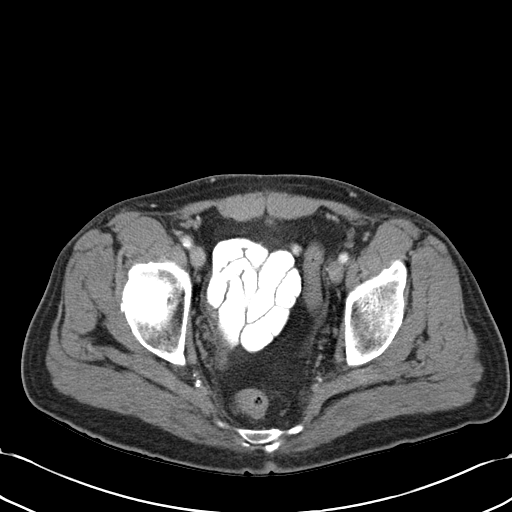
[im 31/84  soft-tissue]
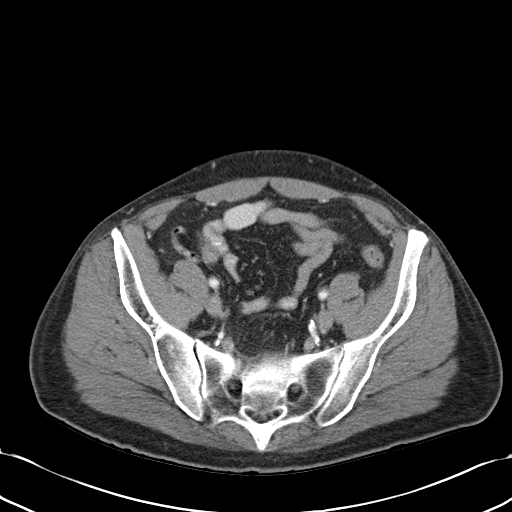
[im 35/84  soft-tissue]
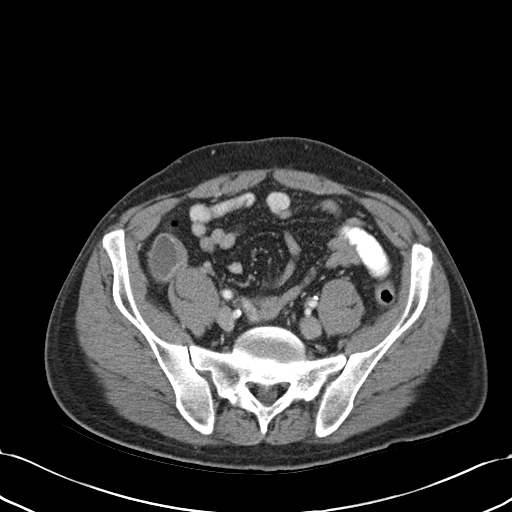
[im 44/84  soft-tissue]
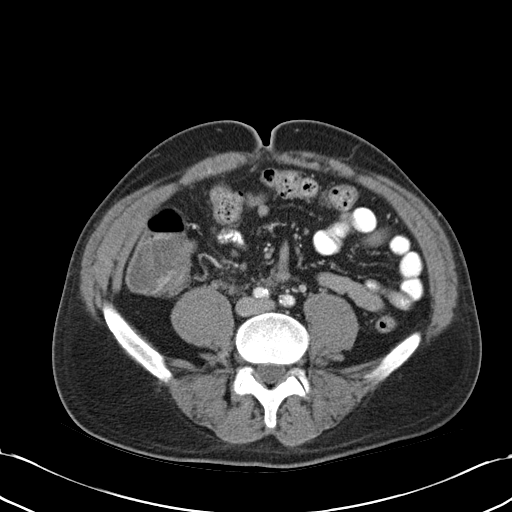
[im 49/84  soft-tissue]
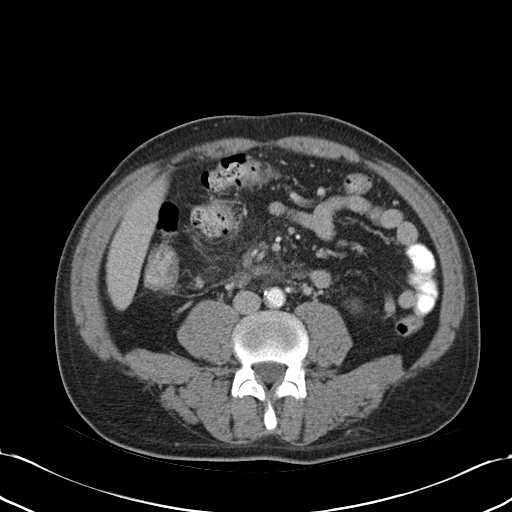
[im 53/84  soft-tissue]
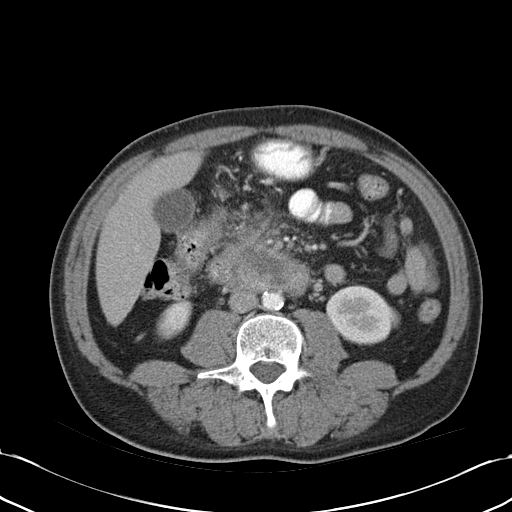
[im 53/84  bone]
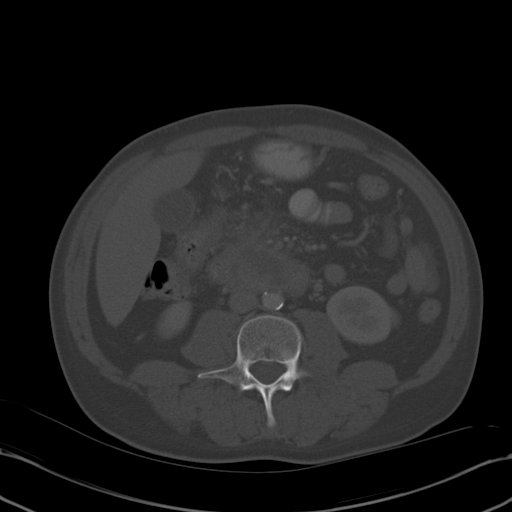
[im 62/84  soft-tissue]
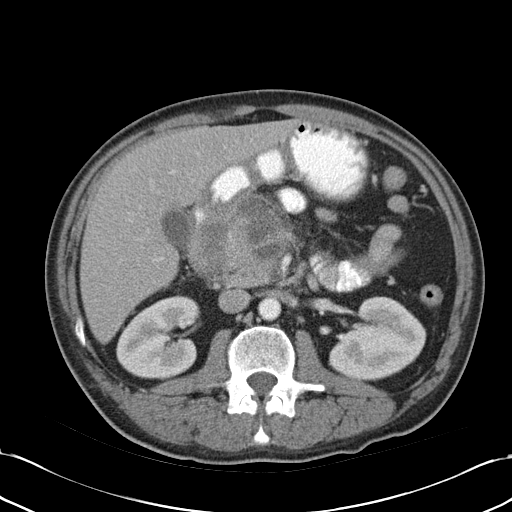
[im 66/84  soft-tissue]
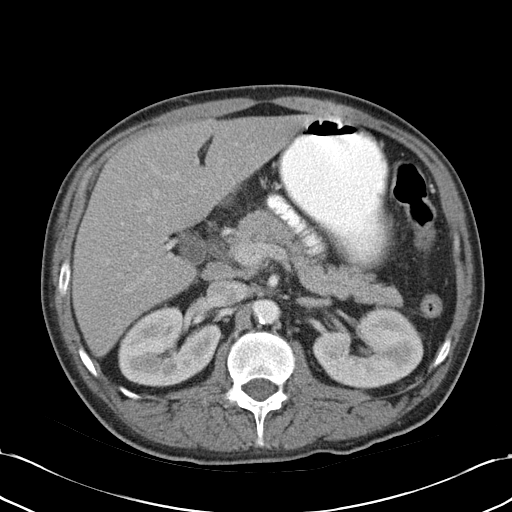
[im 66/84  lung]
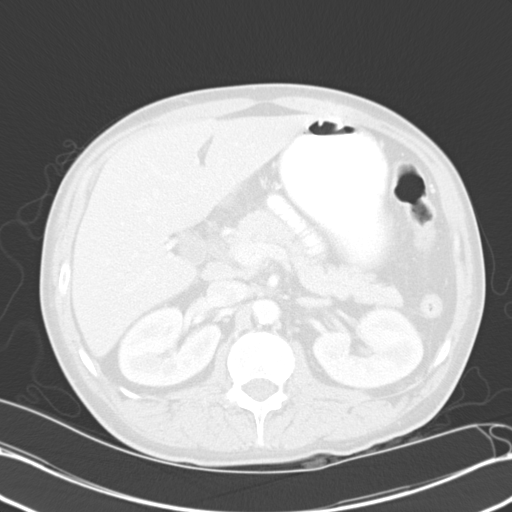
[im 70/84  soft-tissue]
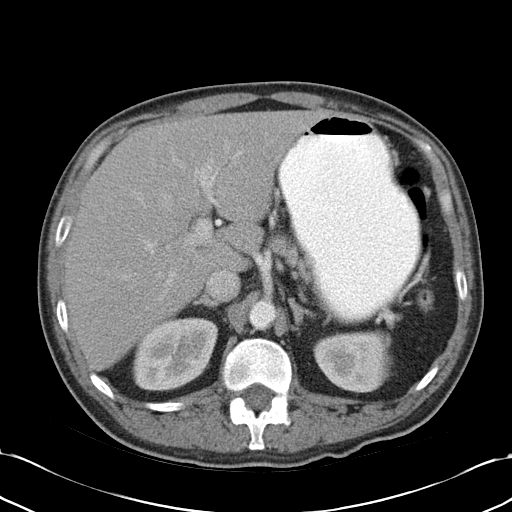
[im 70/84  lung]
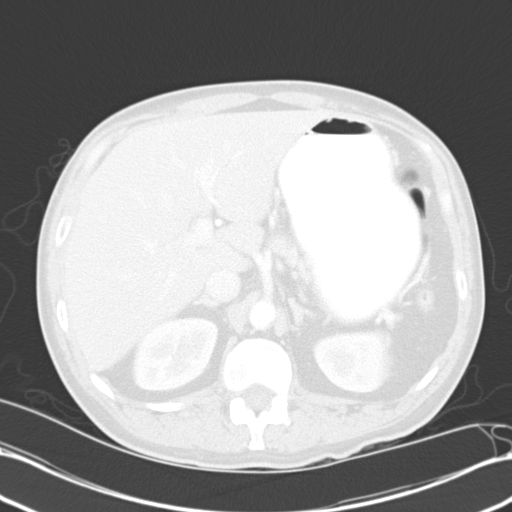
[im 75/84  lung]
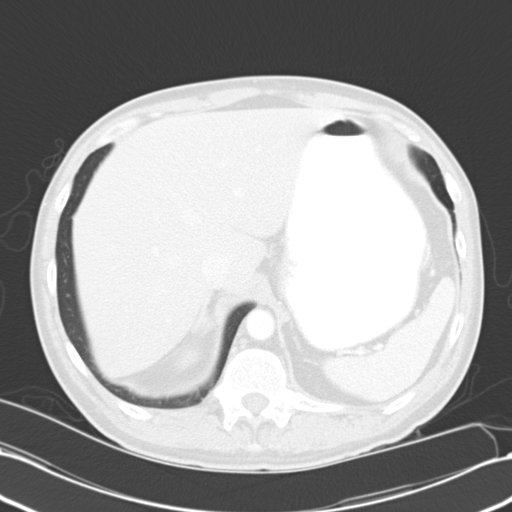
[im 79/84  soft-tissue]
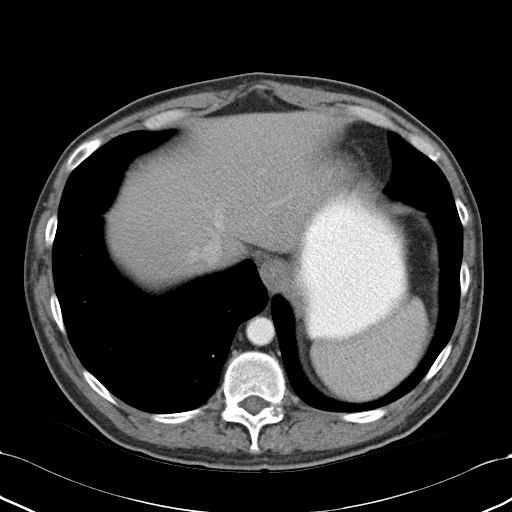
[im 79/84  lung]
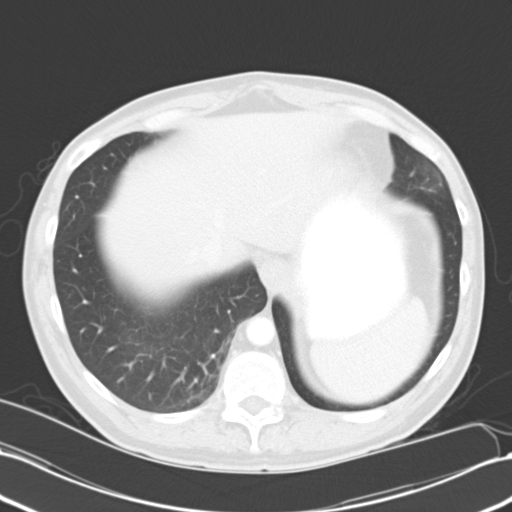

[14 of 32 positions shown; findings below may reference images not displayed]

FINDINGS: The lung bases are clear. The liver enhances with no focal abnormality and no ductal dilatation is seen.  No calcified gallstones are seen. There is a large mass within the head of the pancreas extending into the uncinate process containing several areas of low attenuation.  This mass measures approximately 5.1 x 6.8 cm.  This is most consistent with acute pancreatis and pseudocyst with some strandiness adjacent to the enlarged pancreatic head and proximal body.  For neoplasm to expand so rapidly would be unusual, but close followup is recommended.  The pancreatic duct is slightly more prominent than on the prior study as is the common bile duct but neither are significantly dilated.  The adrenal glands and spleen appear normal.  The kidneys enhance and on delayed images the pelvocaliceal systems appear normal.  The abdominal aorta is normal in caliber.
IMPRESSION: Large mass occupying the head of the pancreas extending into the uncinate process measuring approximately 5.1 x 6.8 cm with areas of low attenuation and some peripancreatic strandiness.  I would favor this representing focal pancreatitis and early pseudocyst formation with neoplasm much less likely.  However, suggest followup to ensure resolution. 
 PELVIS CT WITH CONTRAST:
FINDINGS: The appendix is well seen and appears normal.  The urinary bladder is unremarkable.  Prostate is within the upper limits of normal in size.  No fluid or mass is seen and no adenopathy is noted.
IMPRESSION: Negative CT of the pelvis.

## 2007-10-29 ENCOUNTER — Ambulatory Visit: Payer: Self-pay | Admitting: Gastroenterology

## 2007-10-29 ENCOUNTER — Encounter: Payer: Self-pay | Admitting: Gastroenterology

## 2008-08-08 DIAGNOSIS — K219 Gastro-esophageal reflux disease without esophagitis: Secondary | ICD-10-CM

## 2008-08-08 HISTORY — DX: Gastro-esophageal reflux disease without esophagitis: K21.9

## 2010-02-09 ENCOUNTER — Emergency Department (HOSPITAL_COMMUNITY): Admission: EM | Admit: 2010-02-09 | Discharge: 2010-02-09 | Payer: Self-pay | Admitting: Emergency Medicine

## 2010-02-09 IMAGING — CR DG CHEST 2V
2 series · 2 of 2 positions shown · non-contrast
Comparison: [DATE]

CLINICAL DATA: Cough, wheezing, shortness of breath, history
hypertension, smoking, asthma

CHEST - 2 VIEW

[view not recorded (1 of 2)]
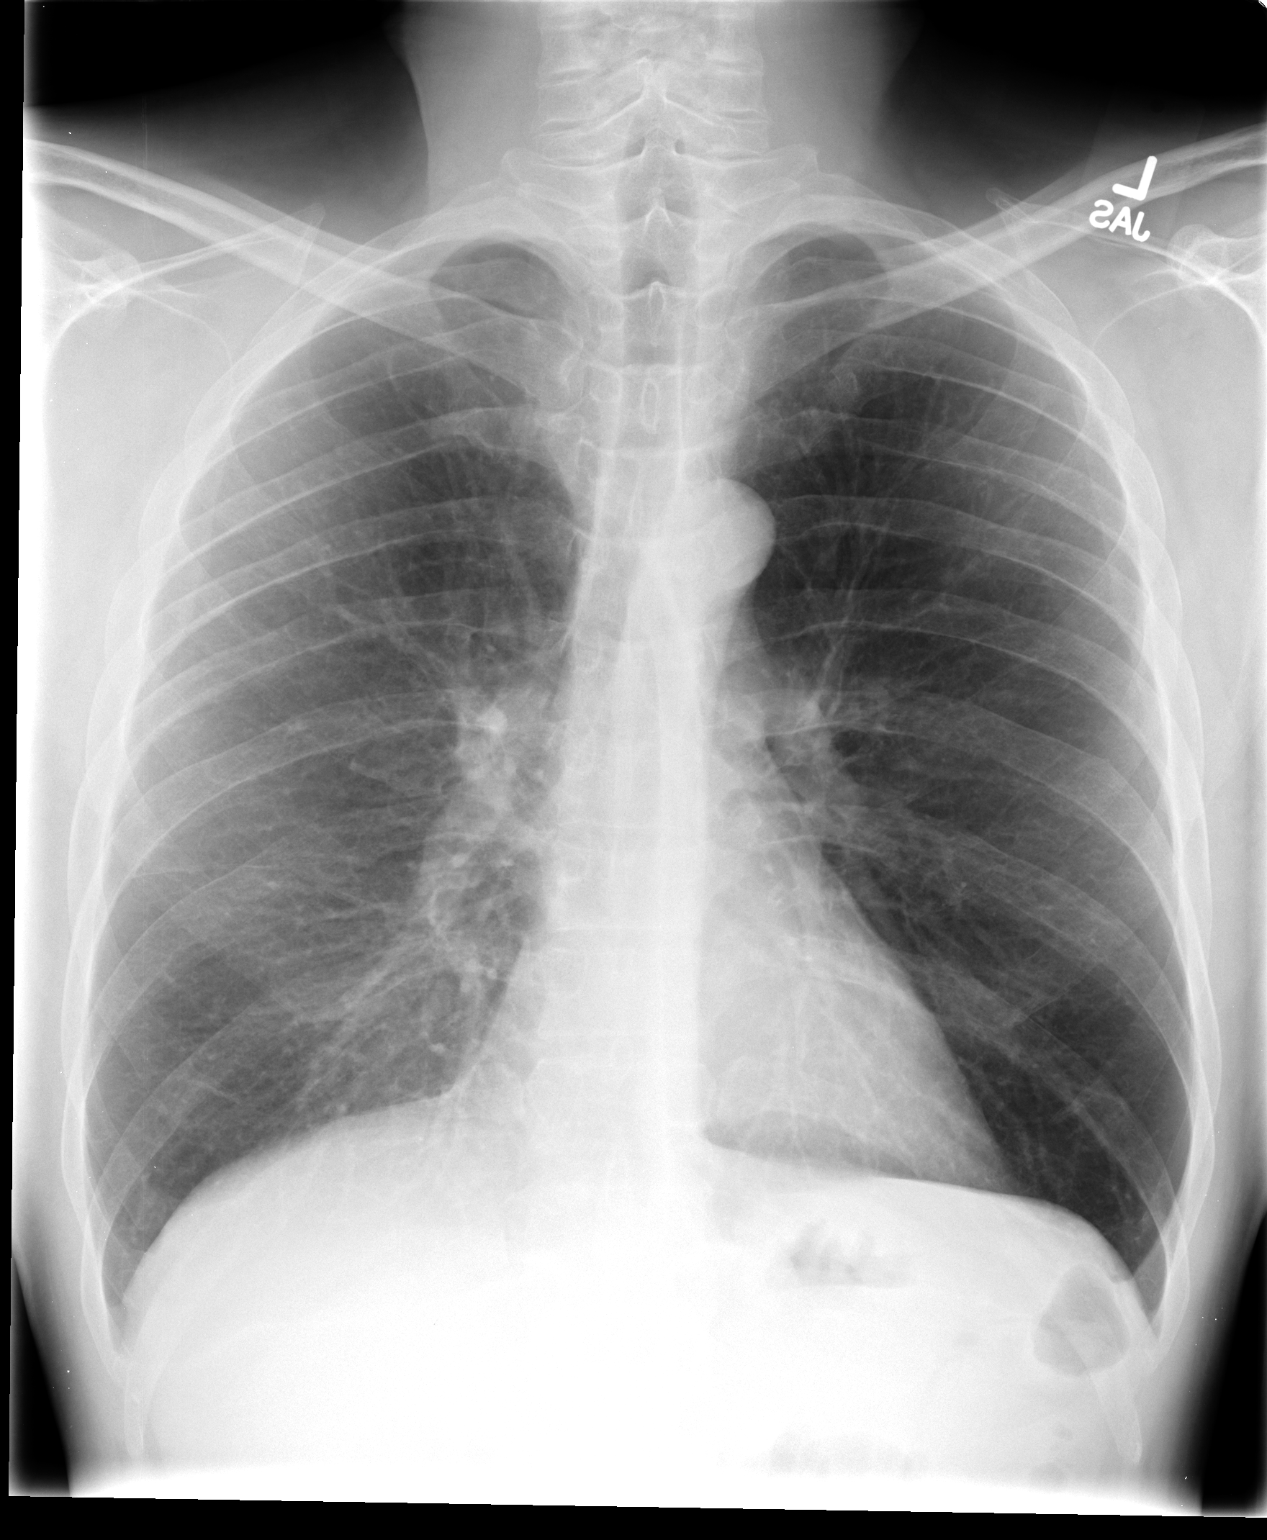

[view not recorded (2 of 2)]
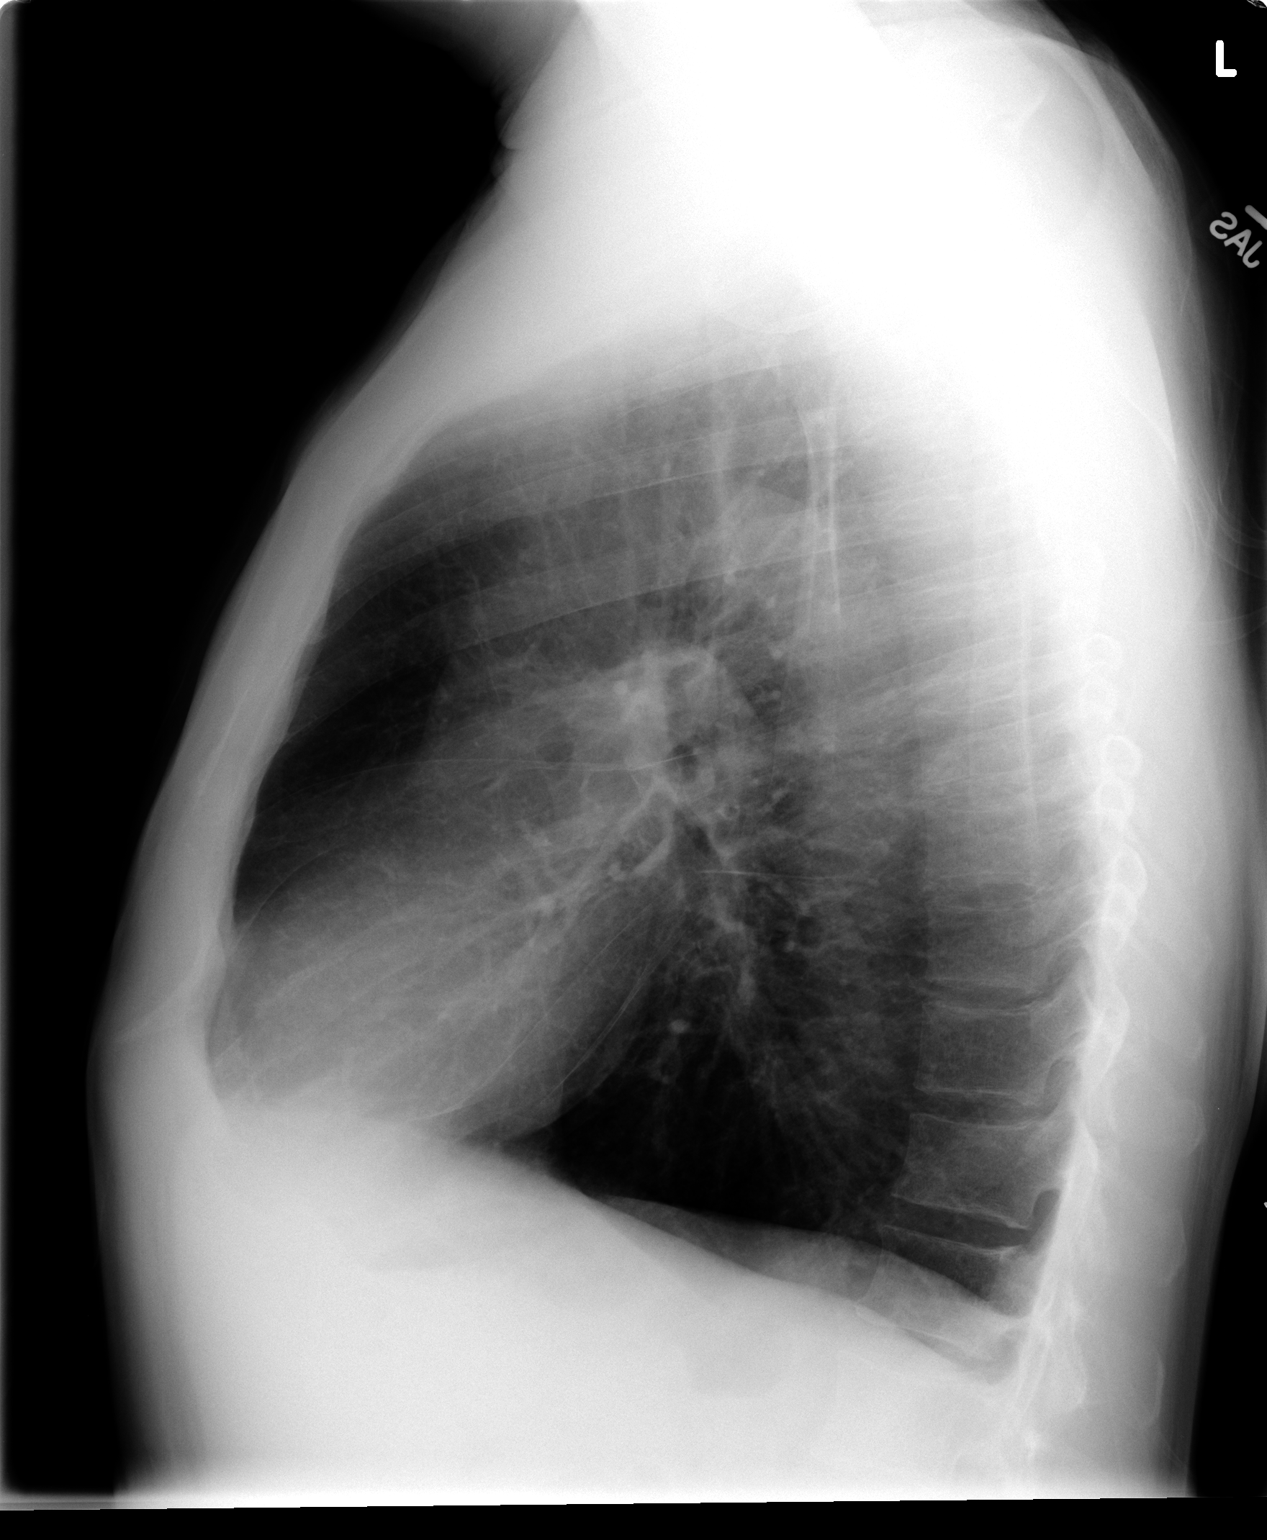

[2 of 2 positions shown; findings below may reference images not displayed]

FINDINGS: Normal heart size, mediastinal contours, and pulmonary vascularity.
Emphysematous changes with minimal asymmetric lucency left
hemithorax versus right.
Mild chronic peribronchial thickening, increased.
No acute infiltrate, pleural effusion or pneumothorax.
No acute bony findings.
IMPRESSION: Changes of COPD and bronchitis.

## 2010-03-24 ENCOUNTER — Emergency Department (HOSPITAL_COMMUNITY): Admission: EM | Admit: 2010-03-24 | Discharge: 2010-03-24 | Payer: Self-pay | Admitting: Emergency Medicine

## 2010-08-28 ENCOUNTER — Encounter: Payer: Self-pay | Admitting: Gastroenterology

## 2010-10-22 LAB — COMPREHENSIVE METABOLIC PANEL
ALT: 46 U/L (ref 0–53)
AST: 104 U/L — ABNORMAL HIGH (ref 0–37)
Albumin: 4.5 g/dL (ref 3.5–5.2)
Alkaline Phosphatase: 95 U/L (ref 39–117)
BUN: 4 mg/dL — ABNORMAL LOW (ref 6–23)
CO2: 27 mEq/L (ref 19–32)
Calcium: 9.8 mg/dL (ref 8.4–10.5)
Chloride: 93 mEq/L — ABNORMAL LOW (ref 96–112)
Creatinine, Ser: 0.79 mg/dL (ref 0.4–1.5)
GFR calc Af Amer: >60 mL/min (ref >60)
GFR calc non Af Amer: >60 mL/min (ref >60)
Glucose, Bld: 119 mg/dL — ABNORMAL HIGH (ref 70–99)
Potassium: 3.5 mEq/L (ref 3.5–5.1)
Sodium: 132 mEq/L — ABNORMAL LOW (ref 135–145)
Total Bilirubin: 1.3 mg/dL — ABNORMAL HIGH (ref 0.3–1.2)
Total Protein: 9 g/dL — ABNORMAL HIGH (ref 6.0–8.3)

## 2010-10-22 LAB — DIFFERENTIAL
Basophils Absolute: 0.1 10*3/uL (ref 0.0–0.1)
Basophils Relative: 1 % (ref 0–1)
Eosinophils Absolute: 0 10*3/uL (ref 0.0–0.7)
Eosinophils Relative: 0 % (ref 0–5)
Lymphocytes Relative: 21 % (ref 12–46)
Lymphs Abs: 1.1 10*3/uL (ref 0.7–4.0)
Monocytes Absolute: 0.6 10*3/uL (ref 0.1–1.0)
Monocytes Relative: 12 % (ref 3–12)
Neutro Abs: 3.5 10*3/uL (ref 1.7–7.7)
Neutrophils Relative %: 65 % (ref 43–77)

## 2010-10-22 LAB — CBC
HCT: 49.3 % (ref 39.0–52.0)
Hemoglobin: 16.7 g/dL (ref 13.0–17.0)
MCH: 31.6 pg (ref 26.0–34.0)
MCHC: 33.8 g/dL (ref 30.0–36.0)
MCV: 93.4 fL (ref 78.0–100.0)
Platelets: 200 10*3/uL (ref 150–400)
RBC: 5.28 MIL/uL (ref 4.22–5.81)
RDW: 14.3 % (ref 11.5–15.5)
WBC: 5.3 10*3/uL (ref 4.0–10.5)

## 2010-10-22 LAB — LIPASE, BLOOD: Lipase: 33 U/L (ref 11–59)

## 2010-10-24 LAB — RAPID STREP SCREEN (MED CTR MEBANE ONLY): Streptococcus, Group A Screen (Direct): NEGATIVE

## 2010-12-21 NOTE — H&P (Signed)
NAMEARLYNN, MCDERMID          ACCOUNT NO.:  [id number]   MEDICAL RECORD NO.:  [id number]          PATIENT TYPE:  OBV   LOCATION:  A211                          FACILITY:  [facility]   PHYSICIAN:  Kassie Mends, M.D.      DATE OF BIRTH:  May 15, 1961   DATE OF ADMISSION:  10/28/2007  DATE OF DISCHARGE:  LH                              HISTORY & PHYSICAL   PRIMARY CARE PHYSICIAN:  Jeoffrey Massed, M.D.   REASON FOR ADMISSION:  Vomiting.   HISTORY OF PRESENT ILLNESS:  Mr. Bielinski is a 50 year old alcoholic  who states his last alcohol consumption was 3-4 days ago.  His entire  family has been ill with abdominal pain and diarrhea.  His wife states  he has been vomiting for the 5-6 days every day.  He has not had any  bowel movements within the last 24 hours.  He has seen no blood in his  stool.  He did have a nose bleed.  He complains of diffuse abdominal  pain which is sharp, off and on.  He also complains of mid to lower back  pain for the last 7 days.  His appetite has been decreased for the last  5-6 days.  He complains of a little indigestion and a little chest pain.  He denies any heartburn.  He has not had any problems swallowing.   PAST MEDICAL HISTORY:  1. Gastroesophageal reflux disease.  2. Hypertension.   PAST SURGICAL HISTORY:  None.   ALLERGIES:  NO KNOWN DRUG ALLERGIES.   MEDICATIONS:  1. Nexium 40 mg daily.  2. Phenergan as needed.  3. Aspirin 81 mg daily.  4. Lisinopril 10 mg daily.  5. HCTZ 25 mg daily.   FAMILY HISTORY:  The patient denies any family history of colon cancer  or colon polyps.   SOCIAL HISTORY:  He continues to drink.  He is married.  He consumes  tobacco products and is unemployed.   PHYSICAL EXAMINATION:  VITAL SIGNS:  Blood pressure 112/74, temperature  97.3, pulse 102, respiratory rate 18, O2 saturation 97% on room air.  GENERAL:  He is in no apparent distress and resting comfortably.HEENT:  Atraumatic, normocephalic.  Pupils are  equal, round, and reactive to  light.  Mouth with no oral lesions.  Posterior pharynx without erythema  or exudate. NECK:  No lymphadenopathy.  Full range of motion.  LUNGS:  Clear to auscultation bilaterally.  CARDIAC:  Regular rhythm.  No murmur.ABDOMEN:  Bowel sounds are present,  soft.  Mild tenderness to palpation of the bilateral upper quadrants  without rebound or guarding.  EXTREMITIES:  No cyanosis or edema.  NEUROLOGIC:  He has no focal neurologic deficits.   LABORATORY DATA:  White count 18.6, hemoglobin 14.1, platelets 557.  Creatinine 0.79, glucose 146, sodium 130.  Total bilirubin 0.6, alkaline  phosphatase 95, AST 14, ALT 12, albumin 3.2, calcium 9.8.  Lipase 21.  UA shows specific gravity of less than 1.005, small bilirubin, ketones  40, trace blood, protein 30, nitrite-negative, leukocyte negative, white  cells 7-10, RBCs 7-10, bacteria few.   RADIOLOGIC STUDIES:  CT scan  of the abdomen and pelvis with contrast  shows a large mass in the head of the pancreas approximately 5 x 6 cm  with several areas of low attenuation favoring focal pancreatitis with  pseudocyst formation.   ASSESSMENT:  Mr. Chapa is a 50 year old male with chronic alcohol  abuse and presents with vomiting, a large mass in the head of his  pancreas.  The differential diagnosis includes resolving acute  pancreatitis or pancreatic head malignancy causing gastric outlet  obstruction, alcoholic gastritis, or Helicobacter pylori gastritis.  He  has a sterile pyuria which suggests that he may have prostatitis as an  etiology for his elevated white count and bacteria and white blood cells  in his urine.   PLAN:  1. He will be n.p.o. except for ice chips and medications.  Will      aggressively hydrate him.  2. Will add an alcohol level and magnesium to the blood in his lab and      also send a urine culture.  3. Will start Cipro at 400 mg IV every 12 hours.  He is requesting a      nicotine patch,  and so he will be started on nicotine at 21 mg      every 24 hours.  4. Will use morphine as needed for pain.  We will continue his      lisinopril and hold his HCTZ.  5. Will put him on around the clock Phenergan and add Protonix IV.  6. Will begin Librium at 50 mg p.o. every 8 hours to prevent alcohol      withdrawal.  7. He will be scheduled for an upper endoscopy on October 29, 2007.      Kassie Mends, M.D.  Electronically Signed     SM/MEDQ  D:  10/28/2007  T:  10/28/2007  Job:  [id number]   cc:   Jeoffrey Massed, MD  Fax: [phone]

## 2010-12-21 NOTE — Procedures (Signed)
NAMECAMRY, Bryan Fleming NO.:  [id number]   MEDICAL RECORD NO.:  [id number]          PATIENT TYPE:  INP   LOCATION:  A202                          FACILITY:  [facility]   PHYSICIAN:  Dani Gobble, MD       DATE OF BIRTH:  1960/11/10   DATE OF PROCEDURE:  03/12/2007  DATE OF DISCHARGE:                                ECHOCARDIOGRAM   REFERRING PHYSICIAN:  Marcello Moores, MD, [facility] and Darlin Priestly, MD   INDICATIONS:  A 50 year old gentleman with known coronary disease and  hypertension who referred for chest discomfort.   The technical quality of the study is adequate.   Aorta measures normally at 3.1 cm.   The left atrium measures normally at 2. Centimeters   The ventricular septum and posterior wall are mildly thickened measured  at 1.3 cm and 1.2 cm, respectively.   The aortic valve leaflets are not well visualized, but this is probably  a trileaflet aortic valve.  There is no limitation to leaflet excursion.  Doppler interrogation of the aortic valve is within normal limits.   The mitral valve also is structurally normal.  No mitral valve prolapse  is noted.  No significant mitral regurgitation is noted.  Doppler  interrogation of mitral valve is within normal limits.   The pulmonic valve appears grossly normal.   Tricuspid valve also is structurally normal with trivial tricuspid  regurgitation noted.   Left ventricle is small in size with LV IDD measured at 3.5 cm.  The LV  ICD measured 2.4 cm.  Overall left ventricular systolic function is  vigorous to hyperdynamic, and no regional wall motion abnormalities were  appreciated.   The right atrium is normal in size.  The right ventricle is moderately  dilated with normal left systolic function.   IMPRESSION:  1. Mild eccentric left ventricular hypertrophy.  2. Small left lower cavity size with vigorous to hyperdynamic left      systolic function.  3. No regional wall motion abnormalities  noted.  4. Moderate right ventricular enlargement with normal left systolic      function.           ______________________________  Dani Gobble, MD     AB/MEDQ  D:  03/12/2007  T:  03/12/2007  Job:  [id number]   cc:   Darlin Priestly, MD  Fax: [phone]   [facility] Team

## 2010-12-21 NOTE — Discharge Summary (Signed)
Bryan Fleming, Bryan Fleming          ACCOUNT NO.:  [id number]   MEDICAL RECORD NO.:  [id number]          PATIENT TYPE:  INP   LOCATION:  A202                          FACILITY:  [facility]   PHYSICIAN:  Thomasenia Bottoms, MDDATE OF BIRTH:  [birth year]   DATE OF ADMISSION:  03/11/2007  DATE OF DISCHARGE:  08/04/2008LH                               DISCHARGE SUMMARY   1. Please see H&P for admission details, but briefly this is a 50-year-      old gentleman who presented with chest pain.  He has several      cardiac risk factors, so he was admitted to the hospital overnight      to rule out MI or acute cardiac syndrome.  The patient did rule out      for MI by cardiac enzymes.  He was pain free in the morning.  His      EKG was a normal EKG, without any ischemic changes.  Because of his      history of cardiac catheterization just 2 years ago and without      knowing results, we did consult his cardiologist.  They recommended      outpatient followup and possible cardiac catheterization as an      outpatient.  The patient did remain pain free here in the hospital.  2. Lung nodule.  He had some history of a lung nodule, so a CT scan of      his chest was done.  There was no evidence of abnormal lung nodule      or malignancy.  3. Positive D-dimer.  The patient was admitted with chest pain.  It      did not seem to be cardiac in nature.  The D-dimer was positive.      He did have a CT scan of his chest, but it was large cuts to      evaluate the nodule, not small cuts to evaluate PE, but his history      was really not consistent with PE.  My clinical suspicion is quite      low, and the CT scan certainly showed no evidence of a large PE, so      we are not pursuing further workup of PE.  4. Mild pancreatitis on CT.  By the following day, the patient's      lipase was normal.  It was not checked on arrival.  The patient is      a heavy drinker, and I suspect that his pain was GI in nature,   either from his pancreas or stomach.  I counseled the patient      extensively about the importance of quitting alcohol.  He      understood and says he will try to quit.  5. Alcohol abuse.  The patient was counseled as mentioned above.  He      was put on p.r.n. Ativan.  He did not require but maybe one dose,      so I do not think he needs to remain in the hospital for inpatient      detox.  I will give him a few tablets of Ativan to use p.r.n.  I      did explain how to use this with his significant other and with the      patient, though he feels that he certainly will not need them, and      he has not in the hospital so far.  6. Hypertension.  This was uncontrolled.  We did restart the patient's      home medications.  I suspect his alcohol use is making his blood      pressure slightly refractory.   DISCHARGE MEDICATIONS:  1. HCTZ 25 mg p.o. daily as before.  2. Lisinopril 10 mg p.o. daily as before.  3. Prilosec OTC 40 mg every day.  4. Ativan 1 mg q.6 h. p.r.n., #10 given.      Thomasenia Bottoms, MD  Electronically Signed     CVC/MEDQ  D:  03/12/2007  T:  03/13/2007  Job:  [id number]   cc:   Jeoffrey Massed, MD  Fax: [phone]

## 2010-12-21 NOTE — H&P (Signed)
NAMESOHAN, POTVIN          ACCOUNT NO.:  [id number]   MEDICAL RECORD NO.:  [id number]          PATIENT TYPE:  INP   LOCATION:  A202                          FACILITY:  [facility]   PHYSICIAN:  Marcello Moores, MD   DATE OF BIRTH:  25-Mar-1961   DATE OF ADMISSION:  03/11/2007  DATE OF DISCHARGE:  LH                              HISTORY & PHYSICAL   PAST MEDICAL DOCTOR:  Unassigned.   CHIEF COMPLAINT:  Chest pain this morning.   HISTORY OF PRESENT ILLNESS:  The patient is a 50 year old male patient  with history of hypertension and questionable coronary artery disease  status post cardiac cath 2-3 years back, and came with chest pain today  to the emergency room.  As per the patient, today at about 7:00 a.m.,  when he woke up he felt pressure type chest pain on the anterior chest  area without radiation to any place, and he vomited once during that  time as well.  He denied any sweating or palpitation at that time and  the pain was intermittent and stayed for one hour until he arrived to  the emergency room, and when he received nitroglycerin in the emergency  room, the pain was relieved.  Otherwise, he denied any fever or cough  and no abdominal complaints.  No urinary complaints.  He did not have  such chest pain before.  He stated that he had cardiac cath around 2  years back in [place], and they told him he has some blockage, but it  was not severe.  He is a chronic smoker, but he denied any alcohol or  drug abuse.   REVIEW OF SYSTEMS:  A 10-point review of systems is as mentioned in the  HPI, otherwise noncontributory.   SOCIAL HISTORY:  He is married and lives in [place], and he is a  chronic smoker and occasional drinker but denied any drug use.   PAST MEDICAL HISTORY:  1. Hypertension.  2. Tobacco abuse.   MEDICATIONS:  Hydrochlorothiazide 25 mg once a day.   ALLERGIES:  She has no any known drug allergies.   PHYSICAL EXAMINATION:  GENERAL:  The patient is  lying in the bed  comfortably, has no pain.  No respiratory distress.  VITAL SIGNS: Blood pressure 138/70, temperature is 97.9, pulse is 105  and respiratory rate is 14 and saturation is 100%.  HEENT:  Pink conjunctivae.  Nonicteric sclera.  NECK:  Supple.  Chest:  Good air entry bilaterally.  No rhonchi or wheezes and no  tenderness on palpitation.  CVS:  S1-S2 well-heard.  No murmur.  ABDOMEN:  Soft, area of tenderness.  EXTREMITIES: No pedal edema.  CNS:  Alert and well oriented.   LABORATORY DATA:  White blood cells 9, hemoglobin is 15, hematocrit 44,  platelet count to 238.  On the chemistry, sodium is 132 and potassium is  3.4, chloride is 95, glucose is 131, and bicarb 26, BUN 3, creatinine  0.7.  Chest x-ray showed 1.1 cm left mid lung nodule and CAT scan of the  chest is recommended.  First cardiac enzymes, CK-MB is 1.2 and troponin  is less than 0.01, and the first EKG is also normal sinus rhythm at 104  per minute.  There is not any ST changes.   ASSESSMENT:  Typical chest pain, rule out acute coronary syndrome.  The  patient is a chronic smoker with history of hypertension, he has past  history cardiac cath, will admit him to the telemetry and will monitor  cardiac enzymes and EKGs.  Will do tomorrow equal and will consult  cardiology as well if they plan to do further investigation.  The  patient will be put on nitroglycerin and aspirin.  1. Lung nodule.  Will do CAT scan with contrast to rule out any      possibility of neoplasm.  He is chronic smoker as well.  2. Hypertension, fairly controlled.  Will continue with his home      medication and will put the patient on DVT and GI prophylaxis as      well.  3. Tobacco abuse.  Will put him on nicotine patch and will educate him      about tobacco cessation as well.      Marcello Moores, MD  Electronically Signed     MT/MEDQ  D:  03/11/2007  T:  03/12/2007  Job:  [id number]

## 2010-12-21 NOTE — Assessment & Plan Note (Signed)
NAMEMarland Kitchen  Fleming, Bryan           CHART#:  [id number]   DATE:                                   DOB:  05-16-1961   DATE OF VISIT:  July 04, 2007.   PRIMARY CARE PHYSICIAN:  Jeoffrey Massed, MD.   PHYSICIAN COSIGNING NOTE:  Kassie Mends, MD.   HISTORY OF PRESENT ILLNESS:  The patient is a 50 year old gentleman, who  presents today for further evaluation of abdominal pain, nausea,  vomiting, and indigestion.  His wife is an [employer] employee,  works in the Radiology Department.  The patient states that he has had  intermittent abdominal pain, nausea, vomiting as well as some diarrhea  off and on for several months.  It has just kind of been progressively  worsening.  Over the last several days, he has had more epigastric pain  and vomiting.  He really denies any typical heartburn but does have some  increased belching and pressure in his chest relieved with belching.  Denies any dysphagia or odynophagia.  He has intermittent diarrhea.  Denies any blood in the stool or melena.  He says he has lost about five  pounds in the last couple of weeks.  He was evaluated in the emergency  department yesterday and had a set of negative cardiac enzymes, his  lipase was elevated at 107, white count elevated at 11,900, hemoglobin  normal, potassium was 2.4, sodium 126, BUN 3, creatinine 0.74, total  bilirubin 0.6, alkaline-phosphatase 82, AST 26, ALT 19, albumin 3.6,  alcohol level was 49.  He had a portable chest x-ray, which revealed no  acute cardiac or pulmonary process.  The patient was hospitalized back  in August for chest pain.  At that time, he underwent a chest CT, which  did show some inflammation in the upper abdomen felt to be due to  pancreatitis.  He has not had a formal abdominopelvic CT.  He ruled out  for MI at that time.  He has never had an EGD.  He had a flexible  sigmoidoscopy in 2005 for diarrhea and was found to have external  hemorrhoids, benign biopsies,  and a small, hyperplastic, rectal polyp.   CURRENT MEDICATIONS:  1. Lisinopril 10 mg daily.  2. Hydrochlorothiazide 25 mg daily.  3. Promethazine 25 mg p.r.n.  4. Tetracycline 500 mg q.i.d.  5. Metronidazole 500 mg q.i.d.  6. Famotidine 20 mg two at bedtime.  7. Bystolic 5 mg daily.   ALLERGIES:  No known drug allergies.   PAST MEDICAL HISTORY:  1. Hypertension.  2. Questionable history of coronary artery disease with a      catheterization in the past, although he is not clear on details.  3. Possible pancreatitis seen on chest CT, August of 2008.  4. Tobacco abuse.  5. Daily alcohol use.   FAMILY HISTORY:  Negative for colorectal cancer, pancreatic disease,  liver disease.   SOCIAL HISTORY:  He is married, he has five children, he is unemployed,  he smokes one-and-a-half pack of cigarettes daily and he has smoked for  over 25 years, consumes two to three 24 ounce beers daily and consumes  brandy two to three times a week.  He denies any illicit drug use.   REVIEW OF SYSTEMS:  See HPI for GI and CONSTITUTIONAL.  CARDIOPULMONARY:  He denies any current chest pain, shortness of breath, cough.   PHYSICAL EXAMINATION:  VITAL SIGNS:  Weight 154, height 5 feet 9, temp  98.7, blood pressure 110/82, pulse 68.  GENERAL:  A very pleasant, well-nourished, well-developed gentleman in  no acute distress.  SKIN:  Warm and dry, no jaundice.  HEENT:  Sclerae are nonicteric, oropharyngeal mucosa moist and pink, no  lesions, erythema, or exudate.  NECK:  No lymphadenopathy or thyromegaly.  CHEST:  Lungs are clear to auscultation.  CARDIAC:  Regular rate and rhythm, normal S1 S2, no murmurs, rubs, or  gallops.  ABDOMEN:  Positive bowel sounds, the abdomen is soft and nondistended,  he has mild to moderate epigastric tenderness to deep palpation, no  rebound tenderness or guarding, no organomegaly or masses, no abdominal  bruits or hernias.  EXTREMITIES:  No edema.   IMPRESSION:  The  patient is a 50 year old gentleman, who presents with a  several month history of intermittent epigastric pain associated with  nausea and vomiting.  He also has some typical gastroesophageal reflux  disease-like symptoms.  He had an elevated lipase yesterday.  Suspect he  has been having some intermittent pancreatitis related to alcohol use.  He really has not had any formal abdominal imaging for these symptoms.  He consumes alcohol on a daily basis.  Differential diagnoses includes  pancreatitis, peptic ulcer disease.   PLAN:  1. We will proceed with CT of the abdomen and pelvis for further      evaluation of abdominal pain.  2. We will add Nexium 40 mg b.i.d.  He will stop the Famotidine.  It      is not clear to me if he has been taking his H.pylori treatment      appropriately as he was discovered to have positive H.pylori back      in August and is taking Tetracycline and Metronidazole at this      time.  He admits to some noncompliance.  3. After CT findings, we will consider an EGD for further evaluation      as needed.  On a chest CT in August of 2008, he had mild right      hilar lymphadenopathy.  We will have him follow up with Dr. Milinda Cave      regarding these findings.   ADDENDUM:  Patient should strictly avoid alcohol. Take his Nexium  regularly. Have a CT scan. Needs H. pylori comliance documented. RPV in  2 months and if his symptoms persists, would consider EGD.       Tana Coast, P.A.  Electronically Signed     Kassie Mends, M.D.  Electronically Signed    LL/MEDQ  D:  07/04/2007  T:  07/04/2007  Job:  [id number]   cc:   Jeoffrey Massed, MD

## 2010-12-21 NOTE — Assessment & Plan Note (Signed)
NAMEMarland Kitchen  JOCK, MAHON           CHART#:  [id number]   DATE:  08/23/2007                       DOB:  [birth year]   REFERRING PHYSICIAN:  Dr. Jeoffrey Massed.   PROBLEM LIST:  1. Alcohol abuse.  2. Hypertension.  3. Tobacco abuse.  4. Mild pancreatitis seen on chest CT in August of 2008 likely      secondary to alcohol.  5. History of coronary artery disease.   SUBJECTIVE:  Mr. Captain is a 50 year old male who presents as a  return patient visit.  He was initially seen by Tana Coast.  He was  complaining of abdominal pain.  At that time he was drinking a  significant amount of alcohol every day.  He is now down to 2 to 3 beers  a day.  He was also changed from Pepcid to Nexium twice a day.  Since  taking Nexium his abdominal pain has resolved.  He is not having any  vomiting, indigestion or nausea.  He does not want to go to alcohol  rehab.  He has no problem swallowing.   MEDICATIONS:  1. Lisinopril.  2. Hydrochlorothiazide.  3. Phenergan p.r.n.  4. Nexium 40 mg daily.  5. Aspirin 325 mg daily.   PHYSICAL EXAMINATION:  VITAL SIGNS:  Weight 163 pounds, height 5 feet, 9  inches.  Temperature 99.4, blood pressure 102/78, pulse 72.  He has  gained 9 pounds since November of 2008.  GENERAL:  He is in no apparent distress, alert and oriented x4.  The  room smells somewhat like alcohol.  LUNGS:  Clear to auscultation bilaterally.  CARDIOVASCULAR EXAM:  Regular rhythm, no murmur.  ABDOMEN:  Bowel sounds present, soft, nontender, nondistended.  No  rebound, no guarding.   ASSESSMENT:  Mr. Wilson is a 50 year old male who has a history of  alcohol abuse and could benefit from outpatient rehabilitation or A. A.  meetings.  In spite of all of his problems he continues to drink 2 or 3  beers a day.  Thank you for allowing me to see Mr. Art in  consultation.  My recommendations follow.   RECOMMENDATIONS:  1. I had a 5-10 minute discussion with Mr. Onstott on  the ills of      continued alcohol consumption.  I explained to him that 2 to 3      beers a day is still too much.  He is asked to reduce that at least      to one beer a day.  2. He is given a prescription for Nexium with a co-pay card and asked      to take one daily.  3. He has a follow up appointment to see me in 3 months.       Kassie Mends, M.D.  Electronically Signed     SM/MEDQ  D:  08/23/2007  T:  08/23/2007  Job:  [id number]   cc:   Jeoffrey Massed, MD

## 2010-12-21 NOTE — Op Note (Signed)
NAMEMIKHI, ATHEY          ACCOUNT NO.:  [id number]   MEDICAL RECORD NO.:  [id number]          PATIENT TYPE:  OBV   LOCATION:  A211                          FACILITY:  [facility]   PHYSICIAN:  Kassie Mends, M.D.      DATE OF BIRTH:  06/02/1961   DATE OF PROCEDURE:  10/29/2007  DATE OF DISCHARGE:                               OPERATIVE REPORT   PROCEDURE:  Esophagogastroduodenoscopy with cold forceps biopsy.   INDICATION FOR EXAM:  Bryan Fleming is a 50 year old male who presented  to the emergency department with back pain and vomiting.  A CT scan  showed an enlarged head of the pancreas.  His lipase was normal.  He  continues to drink alcohol.   FINDINGS:  1. Diffuse erythema of the entire body of the stomach without erosion      or ulceration.  Biopsies to be obtained via cold forceps to      evaluate for H. pylori gastritis.  2. Deformed duodenal bulb.  The posterior wall of the duodenal bulb      had an ecchymotic appearance with ulcerations.  Biopsies obtained      via cold forceps to evaluate for duodenitis or pancreatic      adenocarcinoma.  The second portion of the duodenum was normal.      The scope passed through the duodenal bulb without resistance.  3. White plaque seen in the esophagus that did not resolve.      Occasional linear erosions. Brush biopsies obtained and sent for      KOH prep.  Otherwise no Barrett's, mass, or ulcerations seen in the      esophagus.   DIAGNOSIS:  1. Probable Candida esophagitis.  2. Gastritis, alcoholic versus Helicobacter pylori gastritis.  3. Duodenitis versus local invasion of adenocarcinoma of the pancreas.   RECOMMENDATIONS:  1. He should follow a clear liquid diet for 3 days, and then continue      with a soft low-fat diet.  2. He should use Phenergan around-the-clock for the next 3 days, and      then he may use it as needed.  3. Will await the biopsies.  4. I spoke with the patient and his Fleming.  He should stop  drinking      alcohol.  5. He needs a follow up appointment in 3-4 weeks with Dr. Cira Servant.   MEDICATIONS:  1. Demerol 75 mg IV.  2. Versed 4 mg IV.   PROCEDURE TECHNIQUE:  Physical exam was formed and an informed consent  was obtained from the patient after explaining the benefits, risks, and  alternatives to the procedure.  The patient was placed on the monitor,  and placed in left lateral position.  Continuous oxygen was provided by  nasal cannula.  IV medicine administered with an indwelling cannula.  After administration of sedation, the patient's esophagus was intubated,  and the scope was  advanced under direct visualization to the second portion of the  duodenum.  The scope was removed slowly by carefully examine the color, texture,  anatomy, and integrity of the mucosa on the way out.  The  patient was  recovered in endoscopy, and discharged to the floor in satisfactory  condition.      Kassie Mends, M.D.  Electronically Signed     SM/MEDQ  D:  10/29/2007  T:  10/29/2007  Job:  [id number]   cc:   Jeoffrey Massed, MD  Fax: [phone]

## 2010-12-24 NOTE — Op Note (Signed)
Bryan Fleming, Bryan Fleming          ACCOUNT NO.:  [id number]   MEDICAL RECORD NO.:  [id number]          PATIENT TYPE:  AMB   LOCATION:  ENDO                         FACILITY:  [facility]   PHYSICIAN:  Jordan Hawks. Elnoria Howard, MD    DATE OF BIRTH:  August 18, 1960   DATE OF PROCEDURE:  05/25/2004  DATE OF DISCHARGE:                                 OPERATIVE REPORT   PROCEDURE PERFORMED:  Flexible sigmoidoscopy.   INDICATIONS FOR PROCEDURE:  Diarrhea.   ENDOSCOPIST:  Jordan Hawks. Elnoria Howard, MD   INSTRUMENT USED:  Olympus adult EGD scope.   PHYSICAL EXAMINATION:  CARDIAC:  Regular rate and rhythm.  LUNGS:  Clear to auscultation bilaterally.  ABDOMEN:  Flat, soft, nontender, nondistended.   CONSENT:  Informed consent was obtained from the patient describing the  risks of bleeding, infection, perforation, medication reaction, and the risk  of death, all of which are not exclusive of any other complications than can  occur.   DESCRIPTION OF PROCEDURE:  While the patient was in the left lateral  decubitus position a rectal examination was performed which was negative for  any type of masses of abnormality.  The endoscope was then inserted from the  anus and advanced to approximately 60 cm under direct visualization.  The  patient tolerated the procedure well.  The patient had an unprepped flexible  sigmoidoscopy for the indication of diarrhea.  There was very little stool  and was noted within the lumen of the colon and random biopsies were  obtained in the descending, sigmoid regions.  With retroflexion of the  endoscope, there was evidence of mild external hemorrhoids.  Several  erythematous areas were noted and biopsies were taken of these and it is  unknown of their etiology or significance.  There is no evidence of any  masses, ulcerations, erosions, inflammation.  A small polyp was detected in  the rectum and removed with a cold biopsy.   PLAN:  1.  To return to the clinic as previously scheduled.  2.  Follow-up on biopsy.       PDH/MEDQ  D:  05/25/2004  T:  05/25/2004  Job:  [id number]   cc:   Jeoffrey Massed, M.D.  [address]  [place]  [place] [address]  Fax: [phone]

## 2010-12-24 NOTE — Procedures (Signed)
NAME:  Bryan Fleming, Bryan Fleming                    ACCOUNT NO.:  [id number]   MEDICAL RECORD NO.:  [id number]                   PATIENT TYPE:  OUT   LOCATION:  RAD                                  FACILITY:  [facility]   PHYSICIAN:  Francoise Schaumann. Halm, D.O.                DATE OF BIRTH:  27-Aug-1960   DATE OF PROCEDURE:  03/12/2004  DATE OF DISCHARGE:                                    STRESS TEST   INDICATION:  Screening for chest pain.   The patient is a 50 year old gentleman with hypertension and a smoker.   The patient underwent Bruce protocol exercise treadmill testing.  After  consent was obtained, the patient underwent routine stress testing.  His  resting ECG was notable only for inverted T-waves in V1.  The patient had an  excellent exercise tolerance with no complaints during the study whatsoever.   The patient reached and surpassed his target heart rate reaching a maximum  heart rate of 179.  His resting blood pressure was somewhat elevated and he  had an increase in his blood pressure during the study from 172/108 to a  maximum blood pressure of 240/112.  The study was stopped due to his  reaching his target heart rate as well as his blood pressure response to  exercise.  He exercised a total of 4 minutes and 57 seconds.   ECG tracings revealed progression of horizontal ST depression, predominantly  in lead V5 of just over 2 mm.  He had concurrent ST depression in V4, V6 and  also in inferior leads II, III and aVF.  These changes improved and  normalized at approximately 1 minute during the resting phase of the study.  Again, he had no symptoms whatsoever during the testing.   IMPRESSION:  Evidence of positive stress test testing with concerns of  inferior and lateral ischemia which was quickly reversible.   RECOMMENDATIONS:  We will have to refer him to a cardiologist for eventual  catheterization and evaluation of his silent ischemic heart disease.      ___________________________________________                                            Francoise Schaumann. Leron Croak  D:  03/12/2004  T:  03/12/2004  Job:  [id number]   cc:   Jeoffrey Massed, M.D.  [address]  [place]  [place] [address]  Fax: [phone]

## 2011-01-13 ENCOUNTER — Ambulatory Visit (INDEPENDENT_AMBULATORY_CARE_PROVIDER_SITE_OTHER): Payer: Self-pay | Admitting: Internal Medicine

## 2011-01-13 ENCOUNTER — Other Ambulatory Visit (INDEPENDENT_AMBULATORY_CARE_PROVIDER_SITE_OTHER): Payer: Self-pay | Admitting: Internal Medicine

## 2011-01-13 DIAGNOSIS — R197 Diarrhea, unspecified: Secondary | ICD-10-CM

## 2011-01-13 DIAGNOSIS — K219 Gastro-esophageal reflux disease without esophagitis: Secondary | ICD-10-CM

## 2011-01-14 LAB — HEPATIC FUNCTION PANEL
ALT: 41 U/L — AB (ref 10–40)
AST: 84 U/L — AB (ref 14–40)
Alkaline Phosphatase: 84 U/L (ref 25–125)
Bilirubin, Total: 1 mg/dL

## 2011-01-14 LAB — CBC AND DIFFERENTIAL
HCT: 41 % (ref 41–53)
Hemoglobin: 14.7 g/dL (ref 13.5–17.5)
Platelets: 140 10*3/uL — AB (ref 150–399)
WBC: 6.9 10^3/mL

## 2011-01-17 ENCOUNTER — Ambulatory Visit (HOSPITAL_COMMUNITY)
Admission: RE | Admit: 2011-01-17 | Discharge: 2011-01-17 | Disposition: A | Discharge status: Home / Self Care | Payer: Self-pay | Source: Ambulatory Visit | Attending: Internal Medicine | Admitting: Internal Medicine

## 2011-01-17 DIAGNOSIS — R748 Abnormal levels of other serum enzymes: Secondary | ICD-10-CM | POA: Insufficient documentation

## 2011-01-17 DIAGNOSIS — K869 Disease of pancreas, unspecified: Secondary | ICD-10-CM | POA: Insufficient documentation

## 2011-01-17 DIAGNOSIS — R197 Diarrhea, unspecified: Secondary | ICD-10-CM | POA: Insufficient documentation

## 2011-01-17 DIAGNOSIS — R932 Abnormal findings on diagnostic imaging of liver and biliary tract: Secondary | ICD-10-CM | POA: Insufficient documentation

## 2011-01-17 IMAGING — US US ABDOMEN COMPLETE
1 series · 13 of 25 positions shown · non-contrast
Comparison: [DATE]

CLINICAL DATA: Diarrhea.  History of pancreatitis.  Elevated liver
function tests

COMPLETE ABDOMINAL ULTRASOUND

[Series 1: us abdomen complete · 0.23mm/px · 13 of 111 slices shown]
[im 1/111]
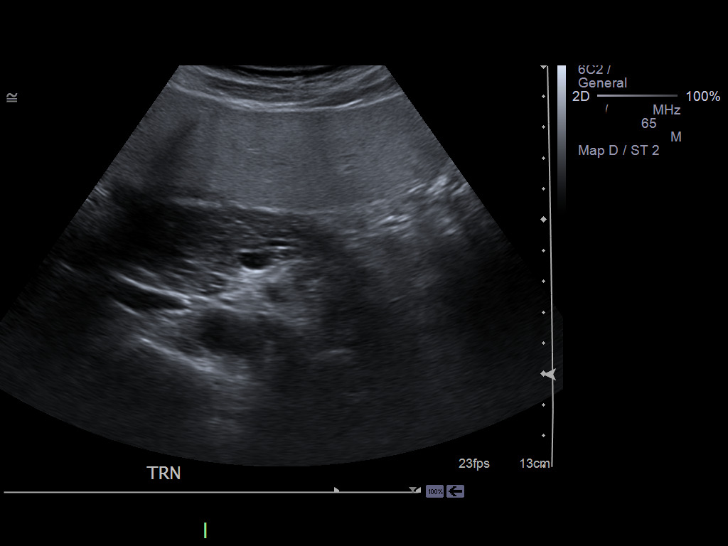
[im 10/111]
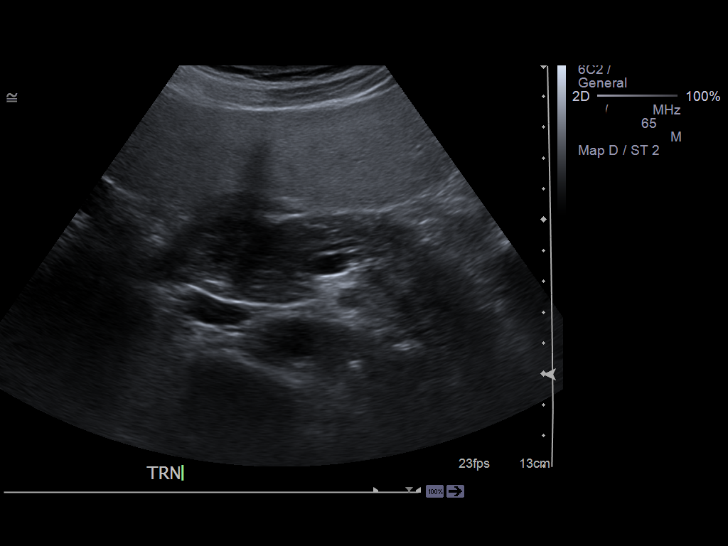
[im 19/111]
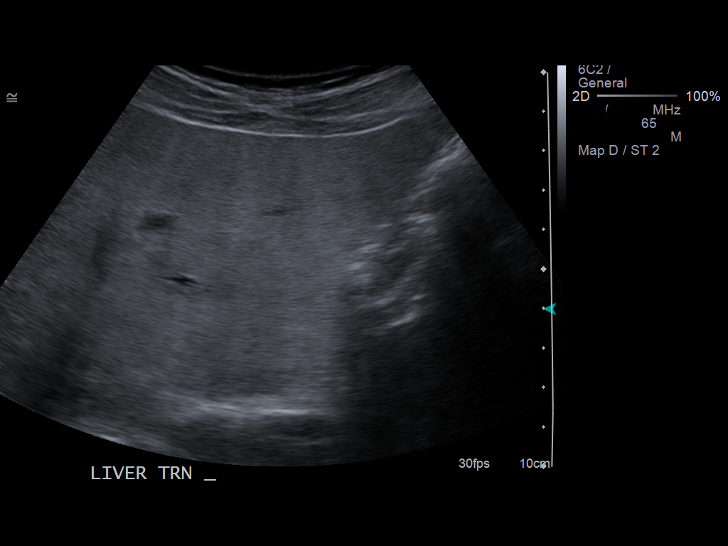
[im 28/111]
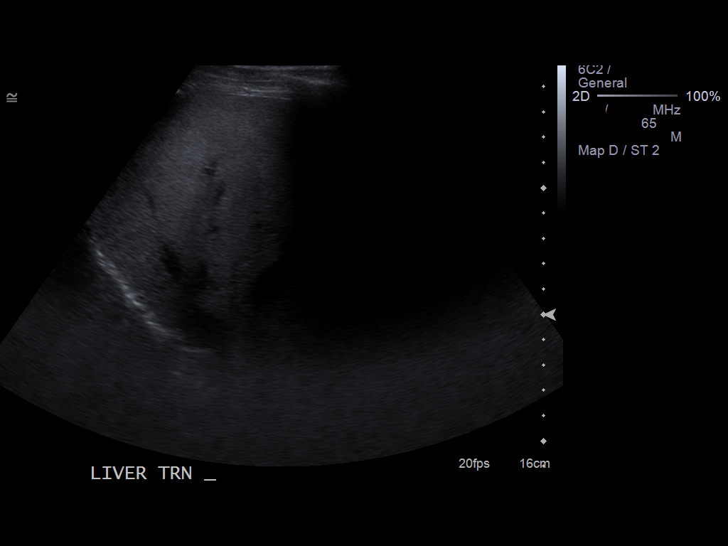
[im 37/111]
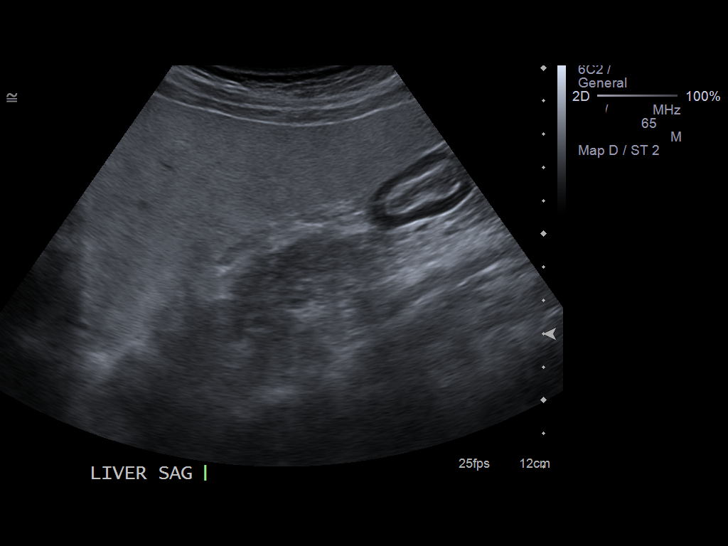
[im 46/111]
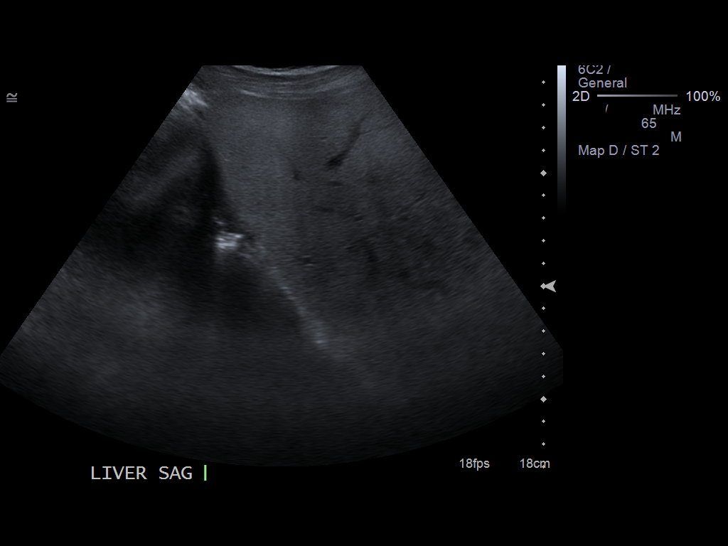
[im 56/111]
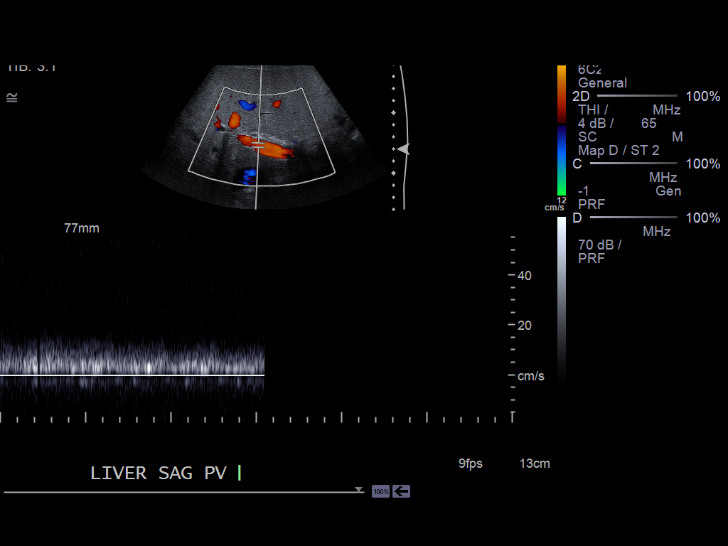
[im 65/111]
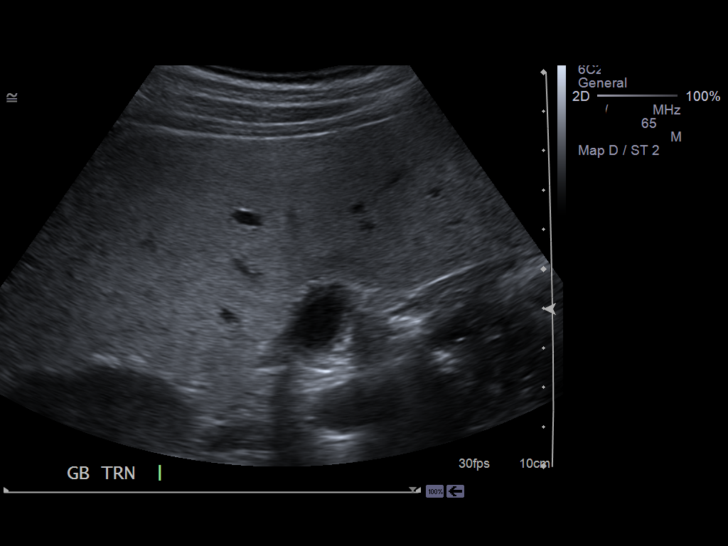
[im 74/111]
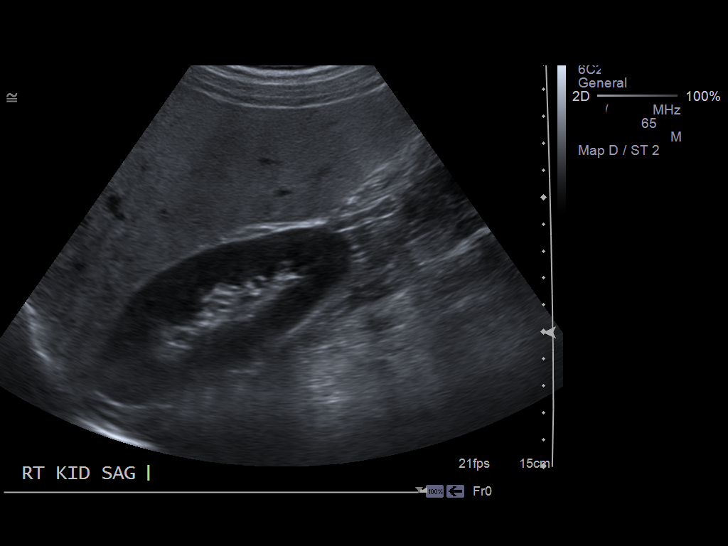
[im 83/111]
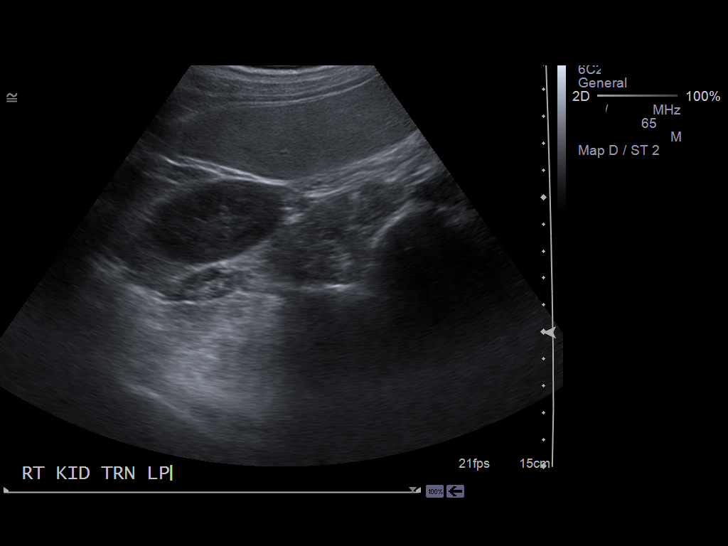
[im 92/111]
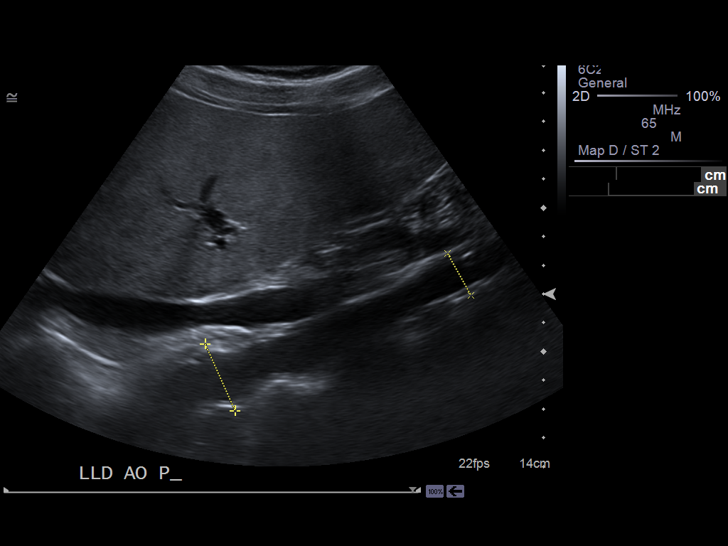
[im 101/111]
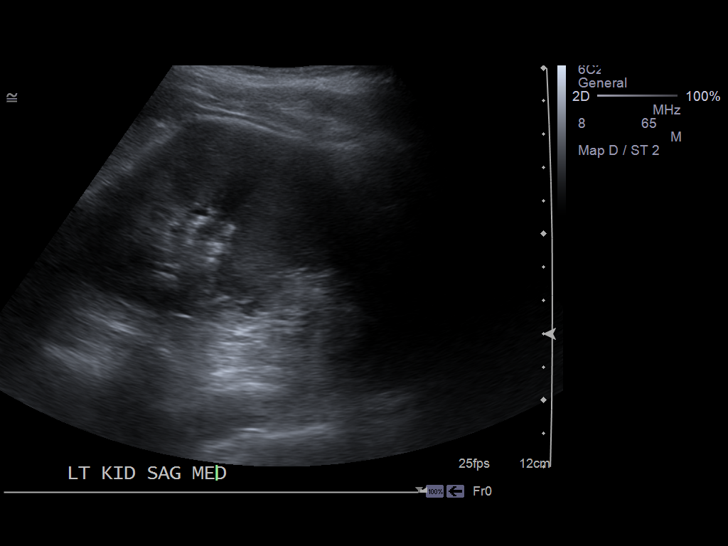
[im 111/111]
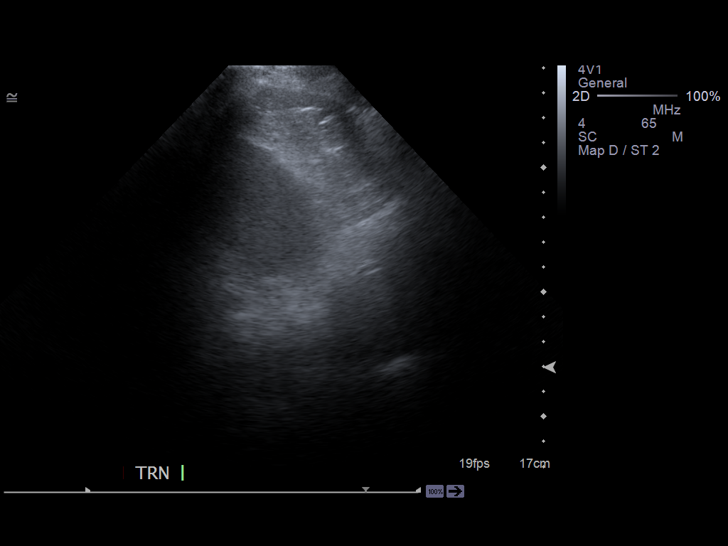

[13 of 25 positions shown; findings below may reference images not displayed]

FINDINGS: Gallbladder:  Gallbladder wall thickening to 4 mm noted.  No
pericholecystic fluid or gallstones.  Sonographic Murphy's sign
absent.

Common bile duct:  Measures 5 mm in diameter, within normal limits.

Liver:  Increased echogenicity in general, which could conceivably
be a manifestation of hepatic steatosis.  No focal lesion
identified.  No definite nodularity of the liver margin to suggest
cirrhosis.

IVC:  Appears normal.

Pancreas:  Abnormal hypodensity in the head of the pancreas is
noted with expansion of the head of the pancreas.  This hypoechoic
region measures approximately 1.6 x 3.5 cm, and does appear to
correspond to the cystic mass shown on the prior CT scan which
previously measured 6.8 x 5.1 cm.

Spleen:  Appears unremarkable.

Right Kidney:  Measures 10.5 cm length in length and appears
normal.

Left Kidney:  Measures 12.1 cm in length and appears normal.

Abdominal aorta:  Intimal thickening noted.  No aneurysm
identified.
IMPRESSION: 1.  Gallbladder wall thickening, without sonographic Murphy's sign
or pericholecystic fluid. Correlate clinically in assessing for
acalculous cholecystitis.
2.  Hypoechoic lesion in the head of the pancreas is smaller than
the previously shown inflammatory mass-like structure in this
region from [DS].  This most likely represents residua from
pancreatitis, including possible chronic pseudocyst.  Malignancy is
not excluded, but given the reduction in size over the last several
years, seems less likely.
3.  There is some hyperechogenicity of the hepatic parenchyma,
which could be a manifestation of hepatic steatosis.

## 2011-01-18 ENCOUNTER — Other Ambulatory Visit (INDEPENDENT_AMBULATORY_CARE_PROVIDER_SITE_OTHER): Payer: Self-pay | Admitting: Internal Medicine

## 2011-01-18 DIAGNOSIS — K859 Acute pancreatitis without necrosis or infection, unspecified: Secondary | ICD-10-CM

## 2011-01-18 DIAGNOSIS — R634 Abnormal weight loss: Secondary | ICD-10-CM

## 2011-01-21 ENCOUNTER — Ambulatory Visit (HOSPITAL_COMMUNITY)
Admission: RE | Admit: 2011-01-21 | Discharge: 2011-01-21 | Disposition: A | Discharge status: Home / Self Care | Payer: Self-pay | Source: Ambulatory Visit | Attending: Internal Medicine | Admitting: Internal Medicine

## 2011-01-21 ENCOUNTER — Encounter (HOSPITAL_COMMUNITY): Payer: Self-pay

## 2011-01-21 DIAGNOSIS — R109 Unspecified abdominal pain: Secondary | ICD-10-CM | POA: Insufficient documentation

## 2011-01-21 DIAGNOSIS — R1031 Right lower quadrant pain: Secondary | ICD-10-CM | POA: Insufficient documentation

## 2011-01-21 DIAGNOSIS — K859 Acute pancreatitis without necrosis or infection, unspecified: Secondary | ICD-10-CM

## 2011-01-21 DIAGNOSIS — K7689 Other specified diseases of liver: Secondary | ICD-10-CM | POA: Insufficient documentation

## 2011-01-21 DIAGNOSIS — Z87898 Personal history of other specified conditions: Secondary | ICD-10-CM | POA: Insufficient documentation

## 2011-01-21 DIAGNOSIS — R634 Abnormal weight loss: Secondary | ICD-10-CM | POA: Insufficient documentation

## 2011-01-21 DIAGNOSIS — N400 Enlarged prostate without lower urinary tract symptoms: Secondary | ICD-10-CM | POA: Insufficient documentation

## 2011-01-21 HISTORY — DX: Essential (primary) hypertension: I10

## 2011-01-21 IMAGING — CT CT ABD-PELV W/ CM
2 of 4 series · 16 of 46 positions shown, 18 images · IV contrast (Omnipaque 300)
Comparison: [DATE]

CLINICAL DATA: Abdominal pain, weight loss, right lower quadrant
pain, abdominal pain, abnormal ultrasound

CT ABDOMEN AND PELVIS WITH CONTRAST
TECHNIQUE: Multidetector CT imaging of the abdomen and pelvis was
performed following the standard protocol during bolus
administration of intravenous contrast. Sagittal and coronal MPR
images reconstructed from axial data set.
Contrast: Dilute oral contrast, 100 ml Omnipaque 300 IV

[Series 2: abd_pel_with 5.0 b40f · axial · 0.68mm/px · z∈[-412,-22]mm · 13 of 88 slices shown, 15 images]
[im 5/88  soft-tissue]
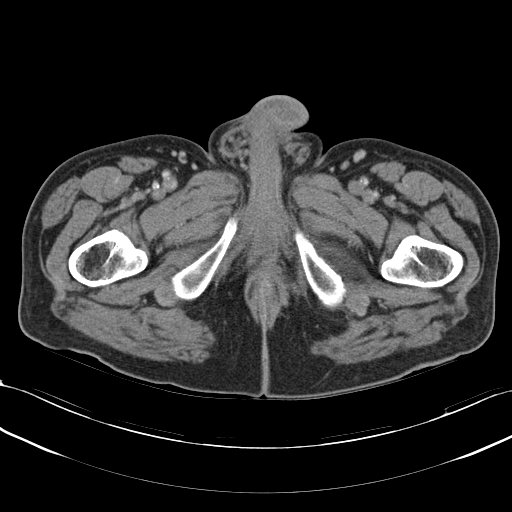
[im 5/88  bone]
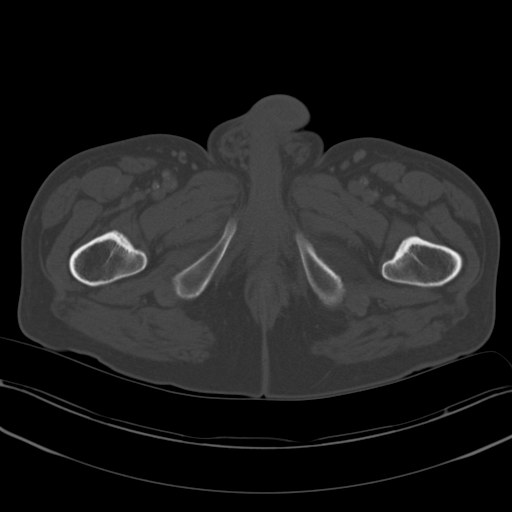
[im 14/88  soft-tissue]
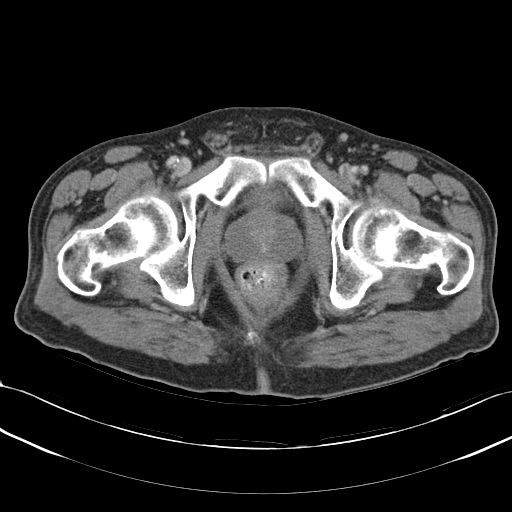
[im 18/88  soft-tissue]
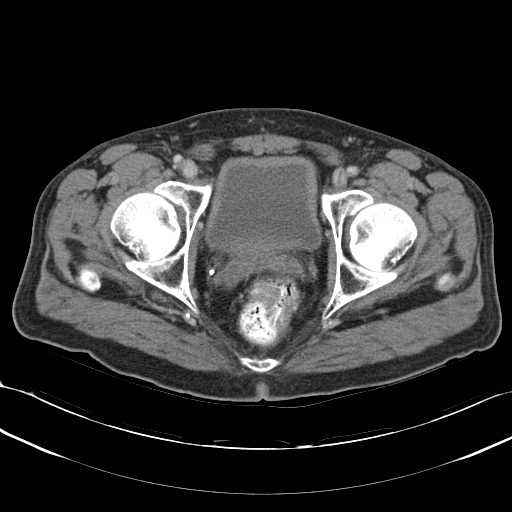
[im 27/88  soft-tissue]
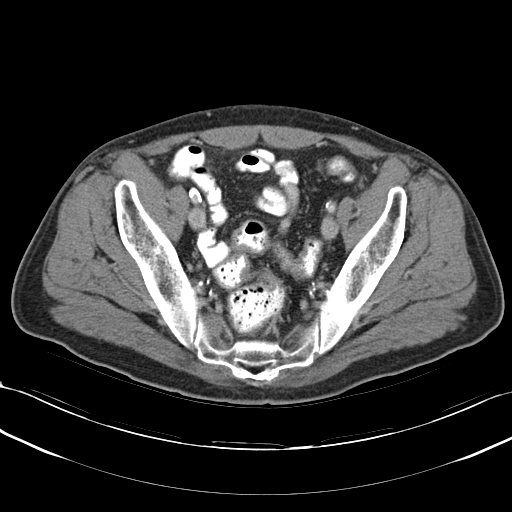
[im 31/88  soft-tissue]
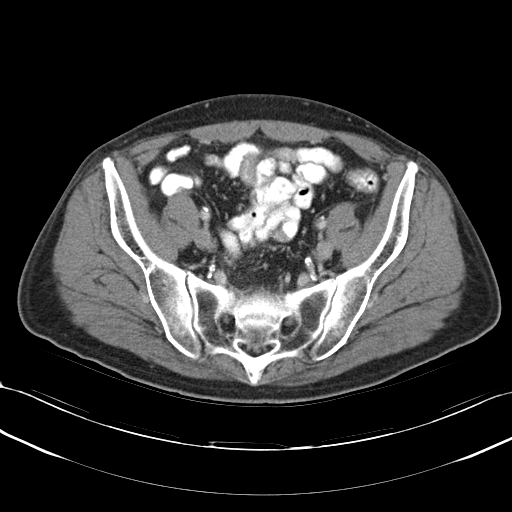
[im 40/88  soft-tissue]
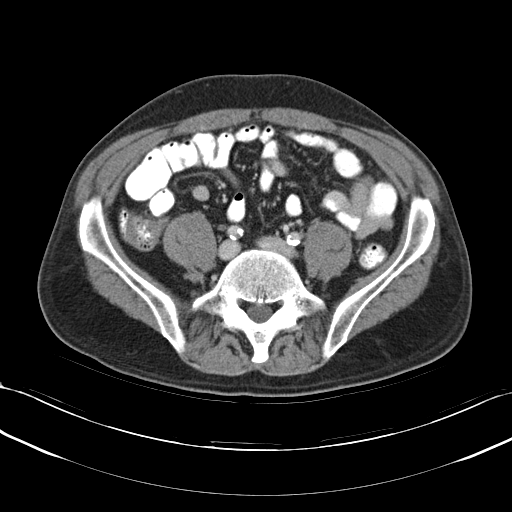
[im 44/88  soft-tissue]
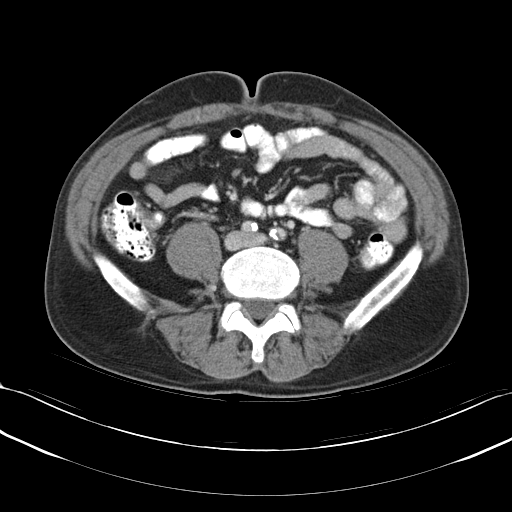
[im 48/88  soft-tissue]
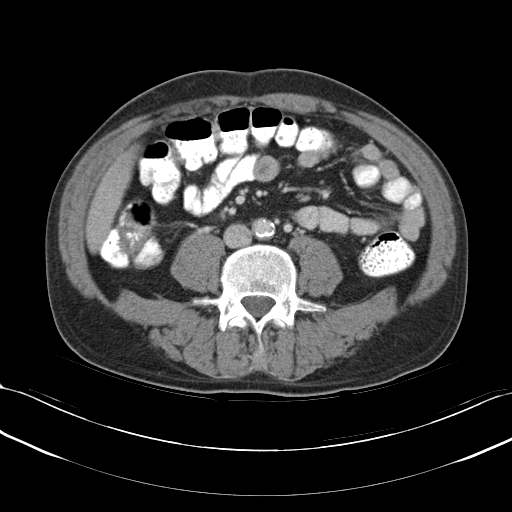
[im 57/88  soft-tissue]
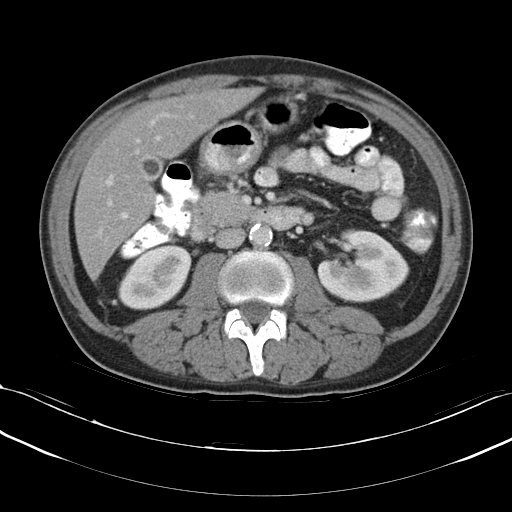
[im 57/88  bone]
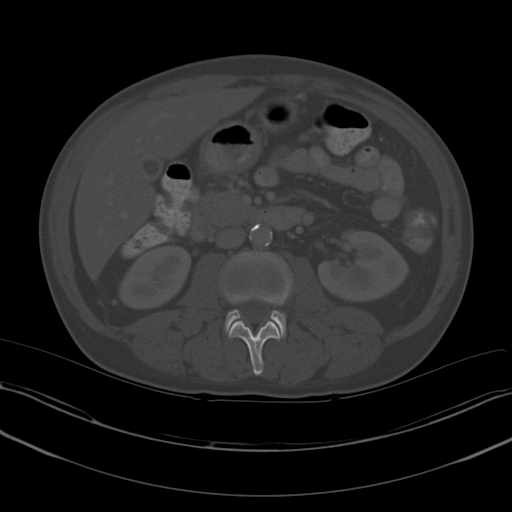
[im 61/88  soft-tissue]
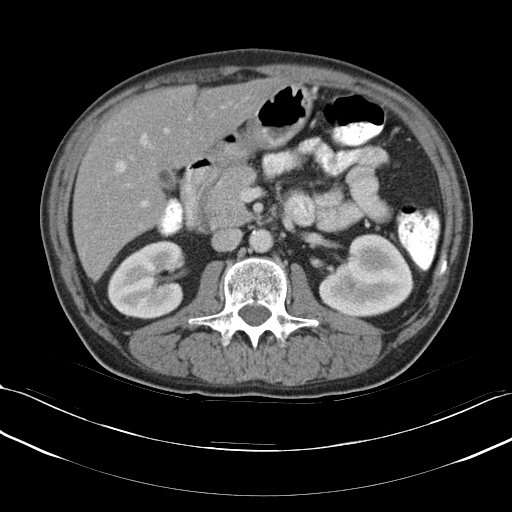
[im 70/88  soft-tissue]
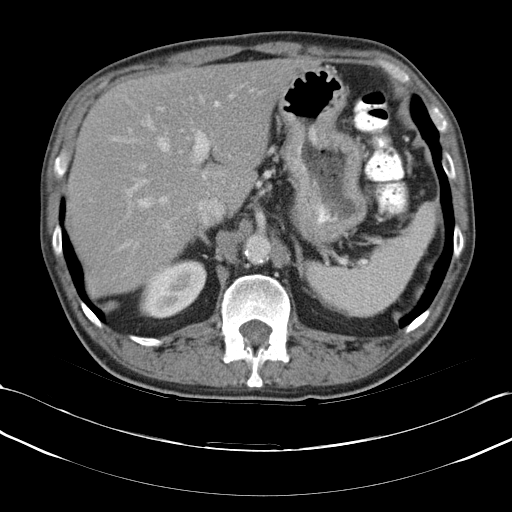
[im 74/88  soft-tissue]
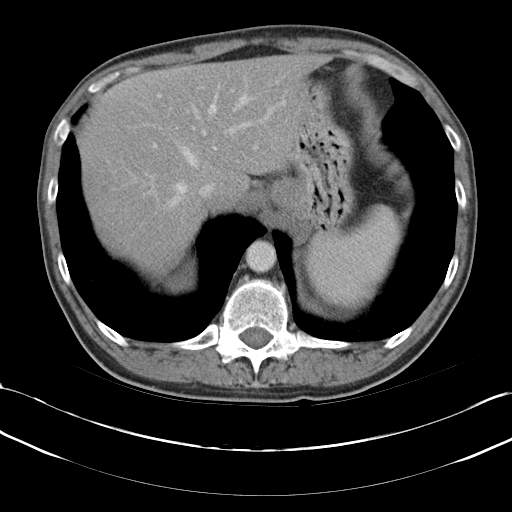
[im 83/88  soft-tissue]
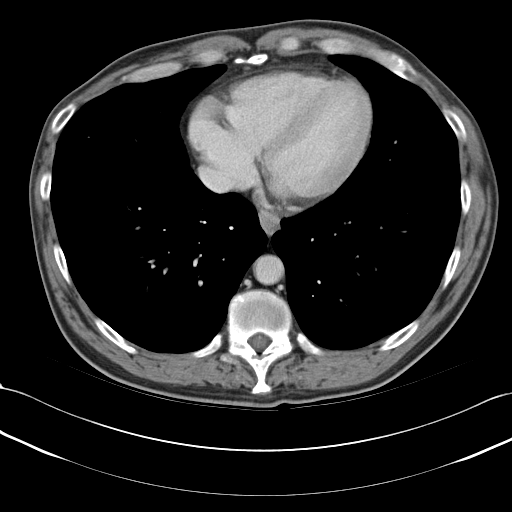

[Series 4: abd_pel_with 3.0 spo cor · coronal · 0.71mm/px · 3 of 66 slices shown]
[im 22/66  soft-tissue]
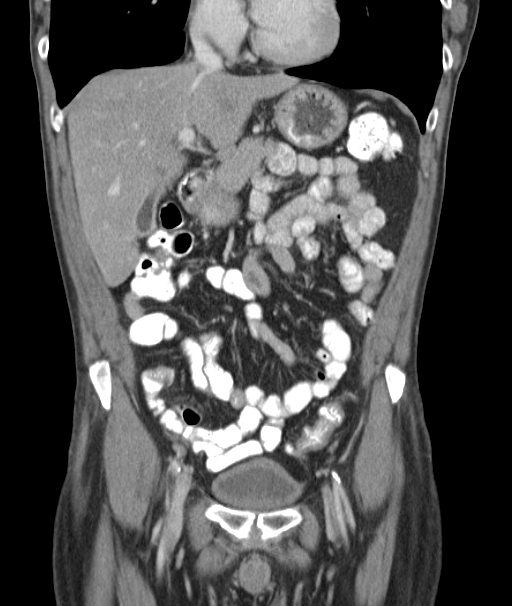
[im 29/66  soft-tissue]
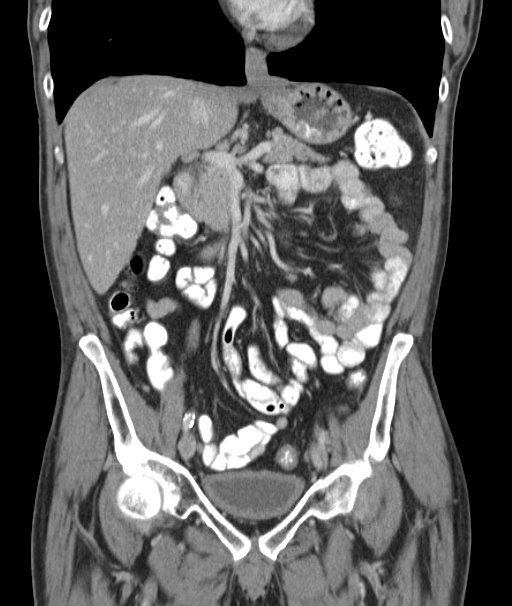
[im 37/66  soft-tissue]
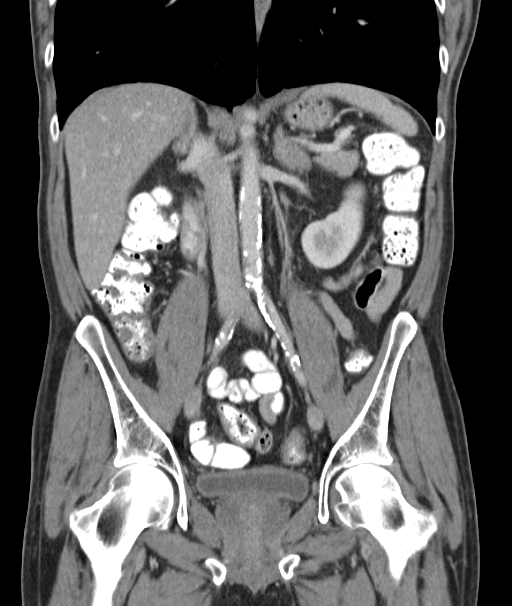

[16 of 46 positions shown; findings below may reference images not displayed]

FINDINGS: Lung bases clear.
Bilateral posterior diaphragmatic defects containing fat.
Minimal fatty infiltration of liver.
No focal abnormalities of the liver, spleen, pancreas, kidneys, or
adrenal glands.
Specifically, the large phlegmon at the pancreatic head/body on the
prior CT associated with pancreatitis is no longer identified.
Scattered atherosclerotic calcifications aorta and iliac arteries.

Stomach incompletely distended, no gross abnormalities seen.
Prostatic enlargement, gland 5.3 x 3.4 cm image 75.
Large and small bowel loops grossly unremarkable for degree of
distention.
Bladder wall minimally prominent in thickness though bladder is
underdistended, question artifact.
No mass, adenopathy, free fluid or inflammatory process.
Normal appendix.
No hernia or acute bony lesion.
IMPRESSION: Minimal fatty infiltration of liver.
Small bilateral posterior diaphragmatic defects containing fat.
Prostatic enlargement.
No definite acute intra abdominal or intrapelvic process.

## 2011-01-21 MED ORDER — IOHEXOL 300 MG/ML  SOLN
100.0000 mL | Freq: Once | INTRAMUSCULAR | Status: AC | PRN
  Administered 2011-01-21: 100 mL via INTRAVENOUS

## 2011-02-16 ENCOUNTER — Encounter (INDEPENDENT_AMBULATORY_CARE_PROVIDER_SITE_OTHER): Payer: Self-pay

## 2011-02-16 DIAGNOSIS — K219 Gastro-esophageal reflux disease without esophagitis: Secondary | ICD-10-CM | POA: Insufficient documentation

## 2011-02-16 DIAGNOSIS — I10 Essential (primary) hypertension: Secondary | ICD-10-CM | POA: Insufficient documentation

## 2011-02-16 DIAGNOSIS — J449 Chronic obstructive pulmonary disease, unspecified: Secondary | ICD-10-CM | POA: Insufficient documentation

## 2011-02-17 ENCOUNTER — Emergency Department (HOSPITAL_COMMUNITY): Payer: Self-pay

## 2011-02-17 ENCOUNTER — Emergency Department (HOSPITAL_COMMUNITY)
Admission: EM | Admit: 2011-02-17 | Discharge: 2011-02-17 | Disposition: A | Discharge status: Home / Self Care | Payer: Self-pay | Attending: Emergency Medicine | Admitting: Emergency Medicine

## 2011-02-17 ENCOUNTER — Other Ambulatory Visit: Payer: Self-pay

## 2011-02-17 ENCOUNTER — Encounter (HOSPITAL_COMMUNITY): Payer: Self-pay | Admitting: Emergency Medicine

## 2011-02-17 DIAGNOSIS — K279 Peptic ulcer, site unspecified, unspecified as acute or chronic, without hemorrhage or perforation: Secondary | ICD-10-CM | POA: Insufficient documentation

## 2011-02-17 DIAGNOSIS — J449 Chronic obstructive pulmonary disease, unspecified: Secondary | ICD-10-CM | POA: Insufficient documentation

## 2011-02-17 DIAGNOSIS — F17200 Nicotine dependence, unspecified, uncomplicated: Secondary | ICD-10-CM | POA: Insufficient documentation

## 2011-02-17 DIAGNOSIS — K859 Acute pancreatitis without necrosis or infection, unspecified: Secondary | ICD-10-CM | POA: Insufficient documentation

## 2011-02-17 DIAGNOSIS — I10 Essential (primary) hypertension: Secondary | ICD-10-CM | POA: Insufficient documentation

## 2011-02-17 DIAGNOSIS — I498 Other specified cardiac arrhythmias: Secondary | ICD-10-CM | POA: Insufficient documentation

## 2011-02-17 DIAGNOSIS — K219 Gastro-esophageal reflux disease without esophagitis: Secondary | ICD-10-CM | POA: Insufficient documentation

## 2011-02-17 DIAGNOSIS — J4489 Other specified chronic obstructive pulmonary disease: Secondary | ICD-10-CM | POA: Insufficient documentation

## 2011-02-17 LAB — CBC
Hemoglobin: 15.7 g/dL (ref 13.0–17.0)
MCHC: 35.2 g/dL (ref 30.0–36.0)
Platelets: 212 10*3/uL (ref 150–400)
WBC: 8.4 10*3/uL (ref 4.0–10.5)

## 2011-02-17 LAB — COMPREHENSIVE METABOLIC PANEL
ALT: 14 U/L (ref 0–53)
AST: 28 U/L (ref 0–37)
Albumin: 4.1 g/dL (ref 3.5–5.2)
CO2: 30 mEq/L (ref 19–32)
Chloride: 89 mEq/L — ABNORMAL LOW (ref 96–112)
Creatinine, Ser: 0.57 mg/dL (ref 0.50–1.35)
GFR calc non Af Amer: >60 mL/min (ref >60)
Sodium: 131 mEq/L — ABNORMAL LOW (ref 135–145)
Total Bilirubin: 1.5 mg/dL — ABNORMAL HIGH (ref 0.3–1.2)

## 2011-02-17 LAB — DIFFERENTIAL
Basophils Absolute: 0 10*3/uL (ref 0.0–0.1)
Basophils Relative: 0 % (ref 0–1)
Monocytes Relative: 10 % (ref 3–12)
Neutro Abs: 6.4 10*3/uL (ref 1.7–7.7)
Neutrophils Relative %: 77 % (ref 43–77)

## 2011-02-17 IMAGING — CT CT ABD-PELV W/ CM
2 of 5 series · 15 of 46 positions shown, 17 images · IV contrast (Omnipaque 300)
Comparison: [DATE].

CLINICAL DATA: Abdominal pain.  Chest pain.

CT ABDOMEN AND PELVIS WITH CONTRAST
TECHNIQUE: Multidetector CT imaging of the abdomen and pelvis was
performed following the standard protocol during bolus
administration of intravenous contrast.
Contrast: 100 ml [CH].

[Series 2: abd_pel_with 5.0 b40f · axial · 0.65mm/px · z∈[-483,-118]mm · 12 of 83 slices shown, 14 images]
[im 5/83  soft-tissue]
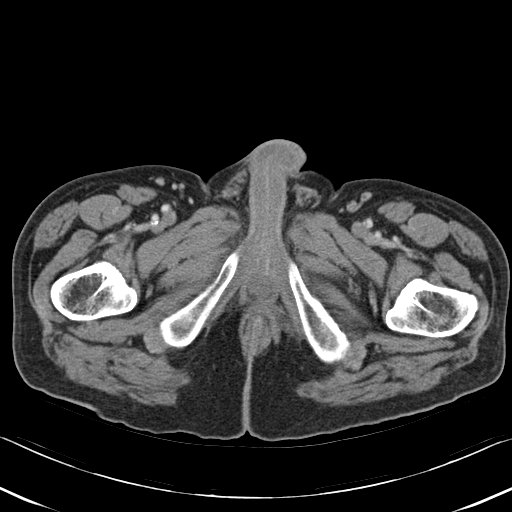
[im 5/83  bone]
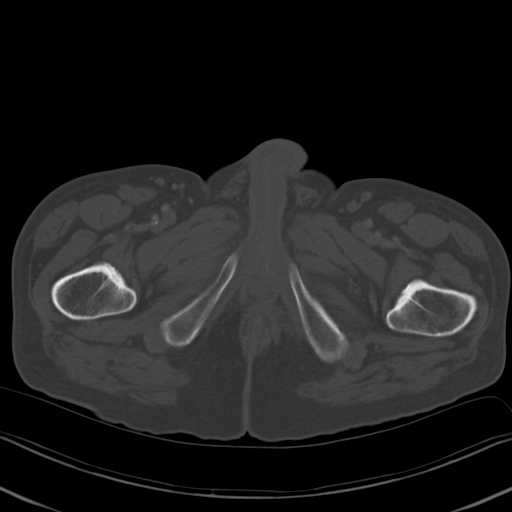
[im 13/83  soft-tissue]
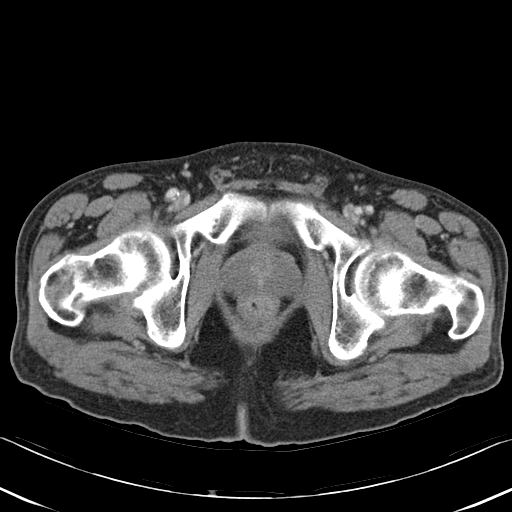
[im 17/83  soft-tissue]
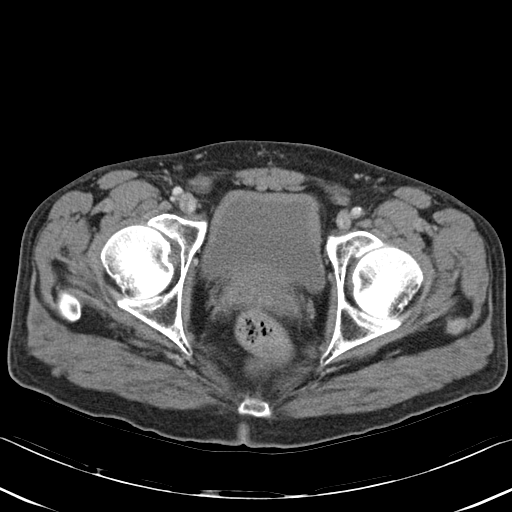
[im 25/83  soft-tissue]
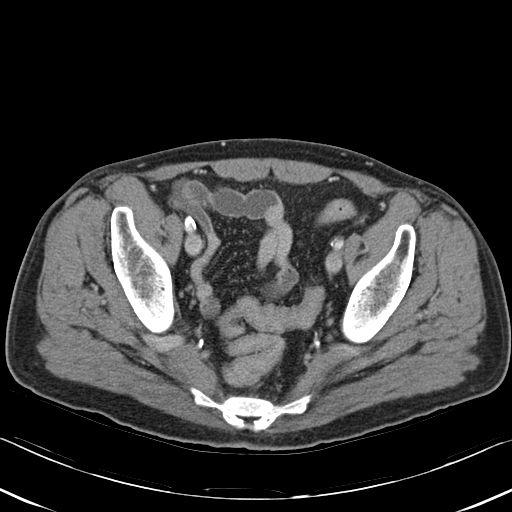
[im 33/83  soft-tissue]
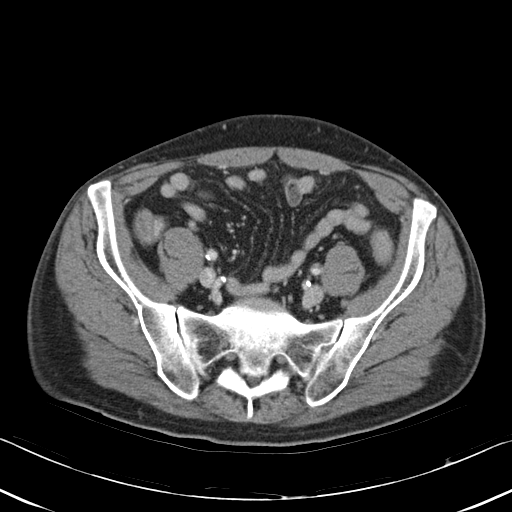
[im 37/83  soft-tissue]
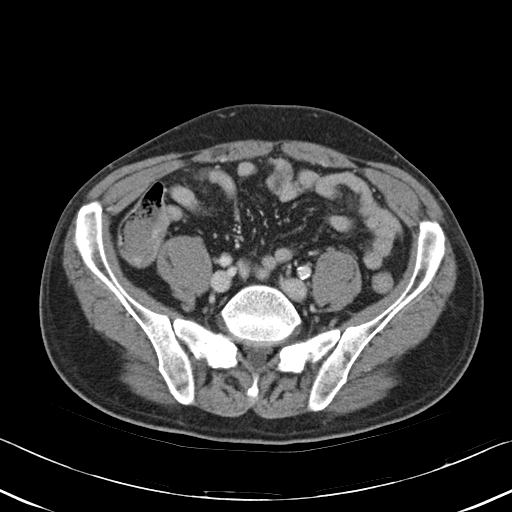
[im 46/83  soft-tissue]
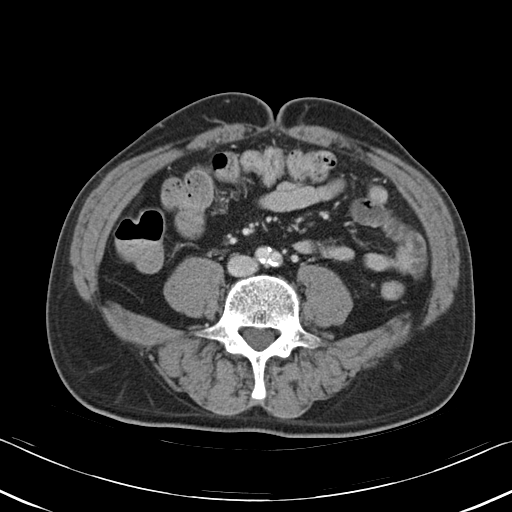
[im 50/83  soft-tissue]
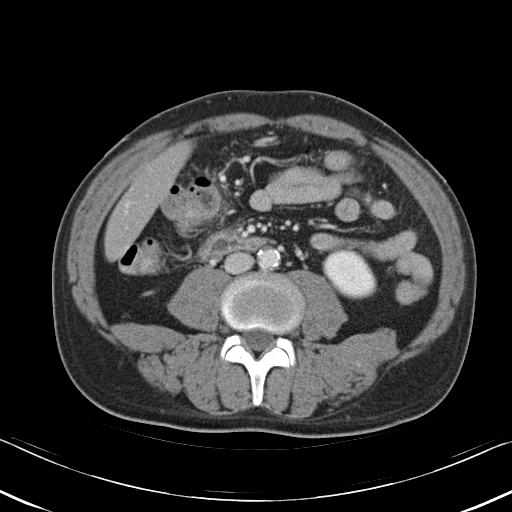
[im 58/83  soft-tissue]
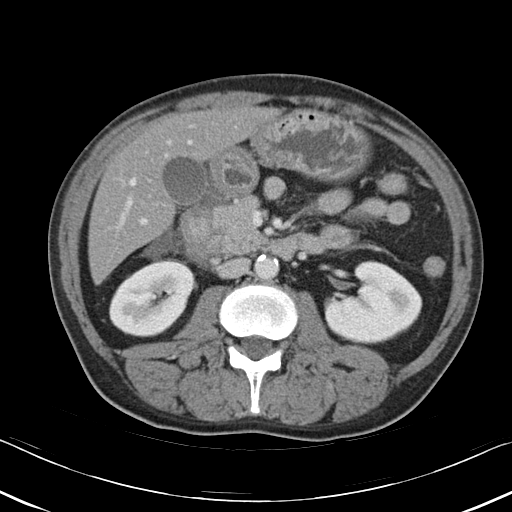
[im 58/83  bone]
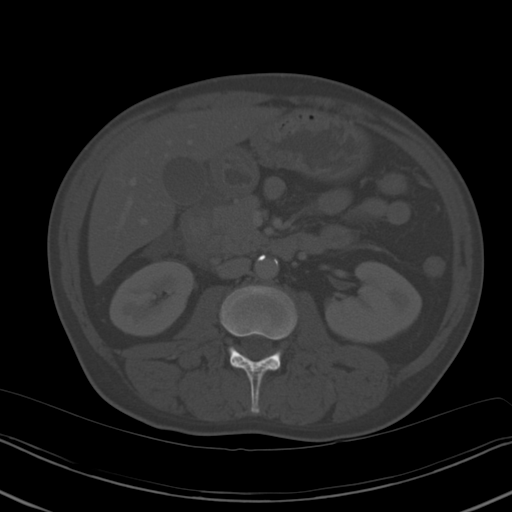
[im 66/83  soft-tissue]
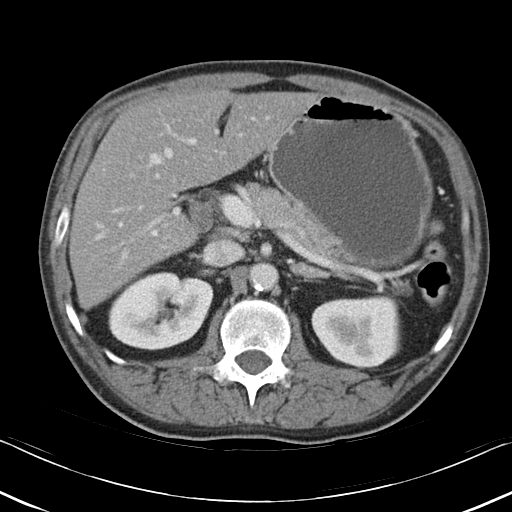
[im 70/83  soft-tissue]
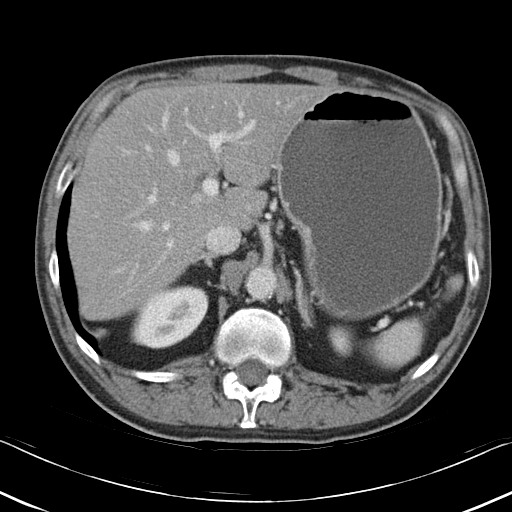
[im 78/83  soft-tissue]
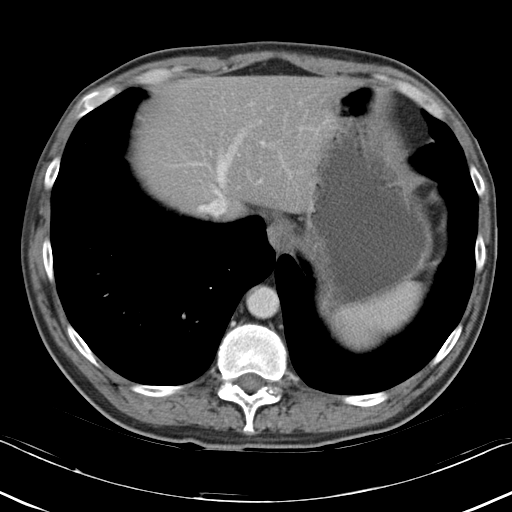

[Series 5: abd_pel_with 3.0 spo · coronal · 0.59mm/px · 3 of 79 slices shown]
[im 27/79  soft-tissue]
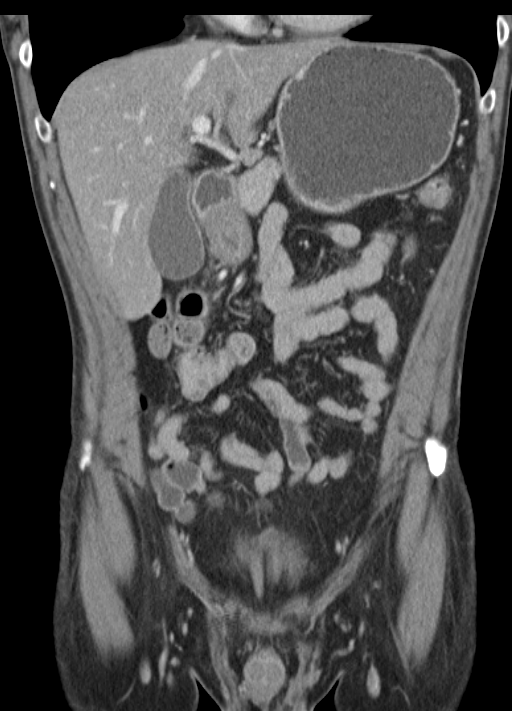
[im 35/79  soft-tissue]
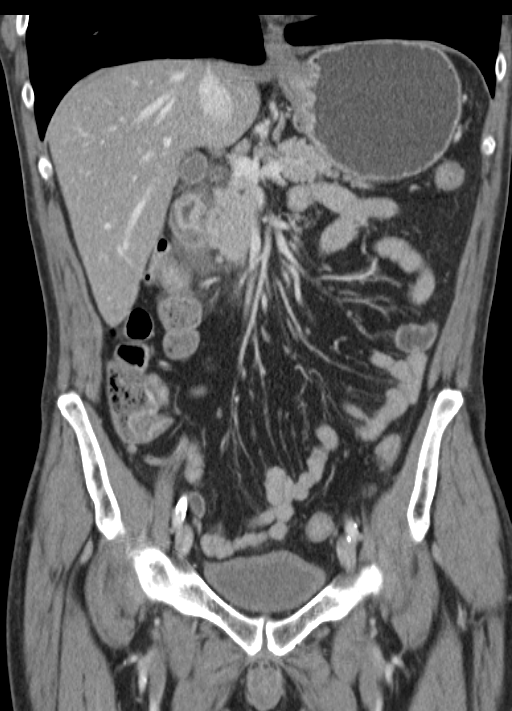
[im 44/79  soft-tissue]
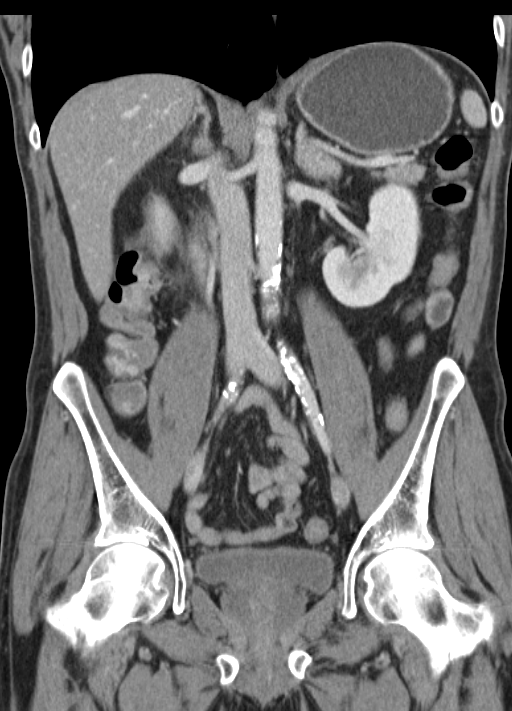

[15 of 46 positions shown; findings below may reference images not displayed]

FINDINGS: There is thickening of the wall of the stomach involving
the antrum and distal fundus. On the recent exam, this appeared
within normal limits.  This extends through the duodenal bulb and
most severely effects the second part of the duodenum.  There is no
perforation.

There is inflammatory fat stranding adjacent to the second part of
the duodenum with heterogeneous enhancement.  Findings are most
compatible with peptic ulcer disease.  The distal esophagus shows
thickening, likely representing recent flex esophagitis.  The
adrenal glands and kidneys are within normal limits.  Vascular
calcifications are present in the right renal hilum.  Delayed renal
excretion of contrast is normal.  Gallbladder distended.  The
pancreas shows normal enhancement.  Abdominal aortic
atherosclerosis without aneurysm.  Prostatomegaly.  Urinary bladder
appears normal.  Pelvic small bowel is unremarkable.  Normal
appendix.  Bystander inflammation is present in the hepatic flexure
of the colon, associated with duodenal inflammation.  The remainder
of the colon is within normal limits.  No aggressive osseous
lesions are identified.  Probable fatty infiltration of the liver.
IMPRESSION: 1.  Inflammatory thickening of the distal stomach and duodenum,
most severely involving the second part of the duodenum is most
compatible with peptic ulcer disease.   Infiltrating neoplasm is
considered unlikely. Follow-up endoscopy should be considered.
2.  Atherosclerosis.
3.  Probable fatty liver.
4.  Thickening of the distal esophagus suggesting gastroesophageal
reflux.

## 2011-02-17 IMAGING — CR DG ABDOMEN ACUTE W/ 1V CHEST
3 series · 3 of 3 positions shown · non-contrast
Comparison: CT [DATE].  Ultrasound [DATE].  Chest
examinations [DATE].

CLINICAL DATA: Abdominal pain.  Nausea.  Vomiting.

ACUTE ABDOMEN SERIES (ABDOMEN 2 VIEW & CHEST 1 VIEW)

[view not recorded (1 of 3)]
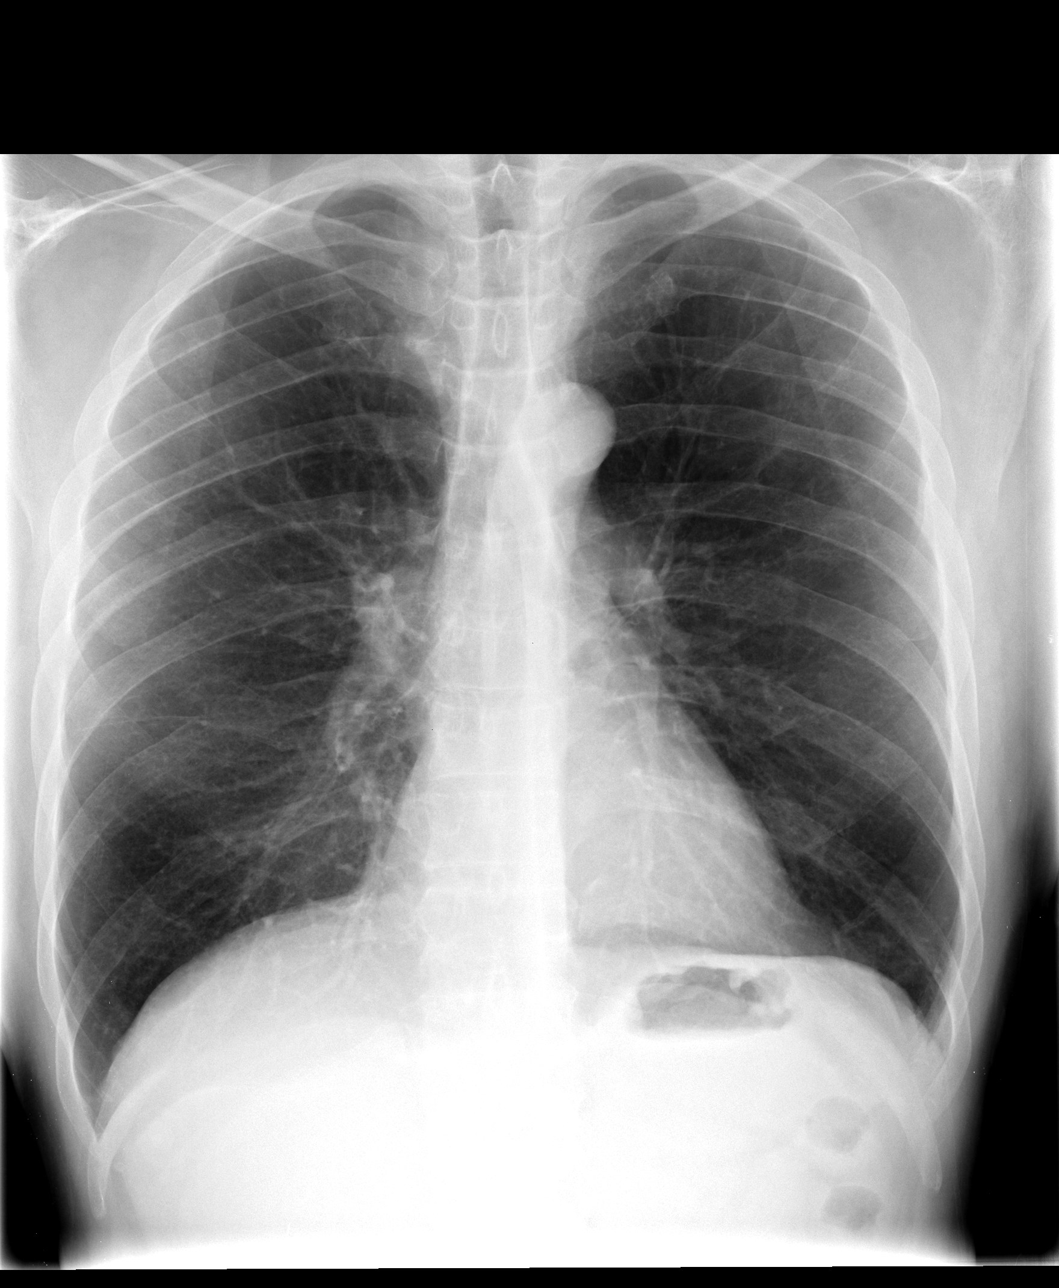

[view not recorded (2 of 3)]
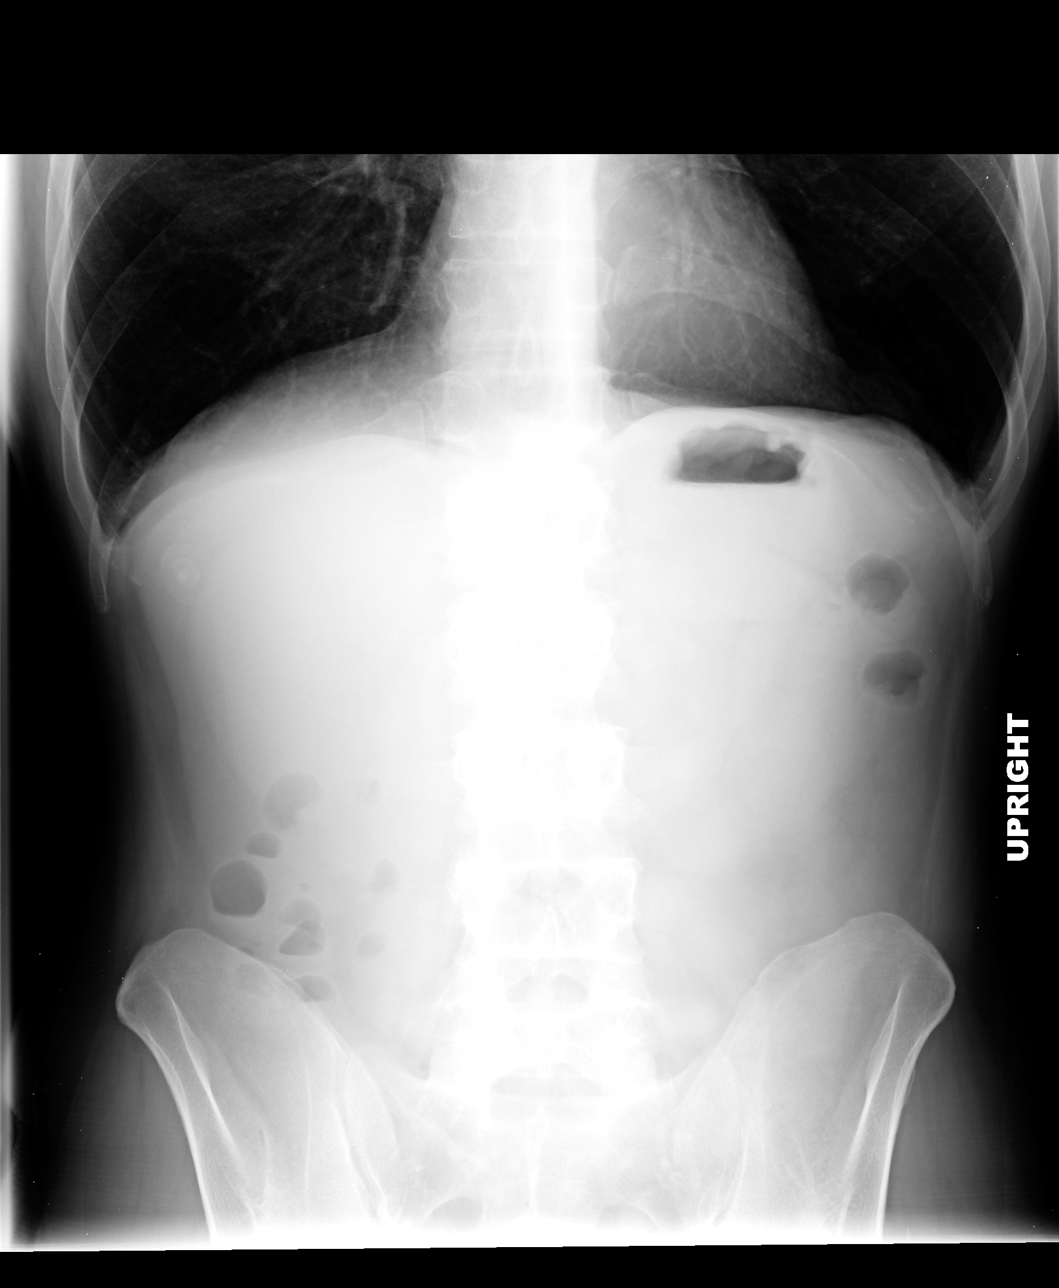

[view not recorded (3 of 3)]
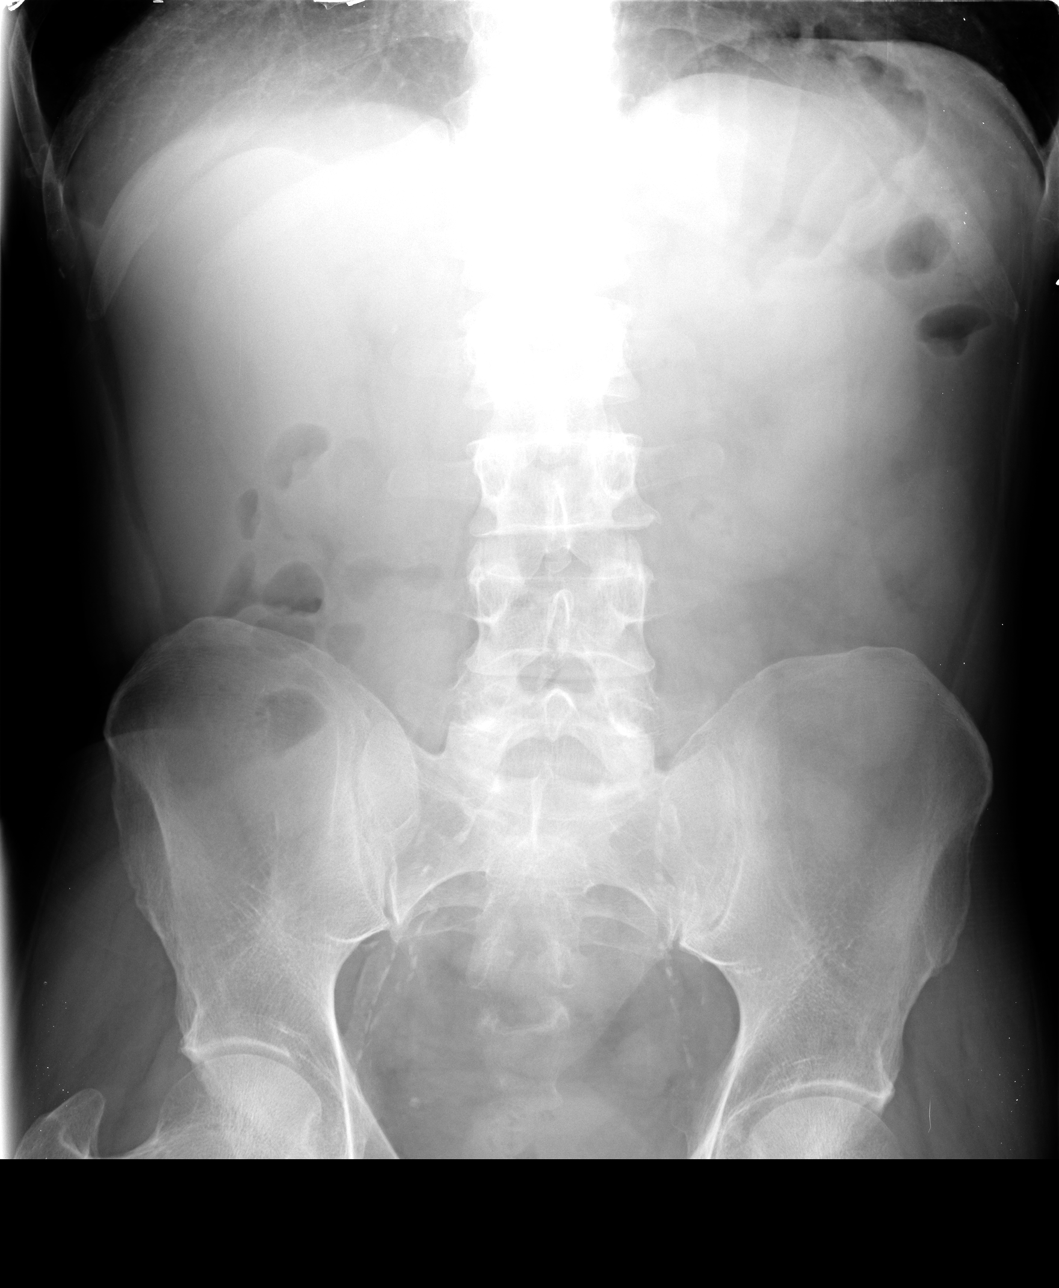

[3 of 3 positions shown; findings below may reference images not displayed]

FINDINGS: The cardiac silhouette is normal size and shape.  There
is generalized hyperinflation configuration.  No pulmonary edema,
pneumonia, pleural effusion, or pneumothorax is seen.

No pneumoperitoneum.  Bowel gas pattern is nonobstructive.
Nonaneurysmal arterial calcifications are seen.  Pelvic phlebolith
is seen.  No opaque calculi. Bones appear average for age.
IMPRESSION: Generalized hyperinflation configuration consistent obstructive
pulmonary disease.  No acute superimposed process.

No pneumoperitoneum.  Bowel gas pattern is nonobstructive.  No
acute abdominal process identified.

## 2011-02-17 MED ORDER — OXYCODONE-ACETAMINOPHEN 5-325 MG PO TABS: 2.0000 | ORAL_TABLET | ORAL | Status: AC | PRN

## 2011-02-17 MED ORDER — HYDROMORPHONE HCL 1 MG/ML IJ SOLN
1.0000 mg | Freq: Once | INTRAMUSCULAR | Status: AC
  Administered 2011-02-17: 1 mg via INTRAVENOUS
  Filled 2011-02-17: qty 1

## 2011-02-17 MED ORDER — ONDANSETRON HCL 4 MG PO TABS: 4.0000 mg | ORAL_TABLET | Freq: Four times a day (QID) | ORAL | Status: AC

## 2011-02-17 MED ORDER — SUCRALFATE 1 G PO TABS: 1.0000 g | ORAL_TABLET | Freq: Four times a day (QID) | ORAL | Status: DC

## 2011-02-17 MED ORDER — ONDANSETRON HCL 4 MG/2ML IJ SOLN
4.0000 mg | Freq: Once | INTRAMUSCULAR | Status: AC
  Administered 2011-02-17: 4 mg via INTRAVENOUS
  Filled 2011-02-17: qty 2

## 2011-02-17 MED ORDER — SODIUM CHLORIDE 0.9 % IV BOLUS (SEPSIS)
1000.0000 mL | Freq: Once | INTRAVENOUS | Status: AC
  Administered 2011-02-17: 1000 mL via INTRAVENOUS

## 2011-02-17 MED ORDER — IOHEXOL 300 MG/ML  SOLN
100.0000 mL | Freq: Once | INTRAMUSCULAR | Status: AC | PRN
  Administered 2011-02-17: 100 mL via INTRAVENOUS

## 2011-02-17 MED ORDER — SODIUM CHLORIDE 0.9 % IV SOLN
Freq: Once | INTRAVENOUS | Status: AC
Start: 2011-02-17 — End: 2011-02-17
  Administered 2011-02-17: 18:00:00 via INTRAVENOUS

## 2011-02-17 NOTE — ED Provider Notes (Addendum)
History     Chief Complaint  Patient presents with  . Chest Pain   HPI Comments: Pt reports intermittent sharp epigastric abd pain with associated heartburn/burning pain radiating to lower chest and n/v worsening x 2 days, almost constant today and more severe. Pt h/o pancreatitis; seen by Dr. Karilyn Cota 3 weeks ago and been cutting back on etoh use since then, used to drink 3 x 40oz daily. Also current smoker.   Patient is a 50 y.o. male presenting with abdominal pain. The history is provided by the patient.  Abdominal Pain The primary symptoms of the illness include abdominal pain, nausea and vomiting. The primary symptoms of the illness do not include fever, shortness of breath, diarrhea or dysuria. The current episode started 2 days ago. The onset of the illness was sudden. The problem has been gradually worsening.  The abdominal pain is located in the epigastric region. Pain radiation: to chest as burning sensation. The abdominal pain is exacerbated by eating (certain foods worse).  Additional symptoms associated with the illness include anorexia and heartburn. Symptoms associated with the illness do not include chills, diaphoresis, constipation, urgency, hematuria, frequency or back pain. Associated medical issues comments: h/o pancreatitis.    Past Medical History  Diagnosis Date  . Hypertension   . COPD (chronic obstructive pulmonary disease) 2007    EMPHYSEMA  . GERD (gastroesophageal reflux disease) 2010    Past Surgical History  Procedure Date  . Cardiac catheterization     4-5 YEARS AGO    Family History  Problem Relation Age of Onset  . Hypertension Mother   . Asthma Father   . Diabetes Sister   . Hypertension Sister   . Hypertension Brother   . Hypertension Sister   . Lupus Daughter     History  Substance Use Topics  . Smoking status: Current Everyday Smoker -- 1.0 packs/day for 20 years    Types: Cigarettes  . Smokeless tobacco: Not on file  . Alcohol Use: 2.3  oz/week    1 Drinks containing 0.5 oz of alcohol, 3 Cans of beer per week      Review of Systems  Constitutional: Negative for fever, chills and diaphoresis.  HENT: Negative for neck pain.   Respiratory: Negative for shortness of breath.   Cardiovascular: Positive for chest pain.  Gastrointestinal: Positive for heartburn, nausea, vomiting, abdominal pain and anorexia. Negative for diarrhea, constipation and blood in stool.  Genitourinary: Negative for dysuria, urgency, frequency and hematuria.  Musculoskeletal: Negative for back pain.  Neurological: Negative for dizziness.  All other systems reviewed and are negative.    Physical Exam  BP 170/110  Temp(Src) 98.2 F (36.8 C) (Oral)  Resp 20  Ht 5\' 9"  (1.753 m)  Wt 160 lb (72.576 kg)  BMI 23.63 kg/m2  SpO2 100%  Physical Exam  Nursing note and vitals reviewed. Constitutional: He is oriented to person, place, and time. He appears well-developed and well-nourished. No distress.  HENT:  Head: Normocephalic.  Mouth/Throat: Mucous membranes are normal.  Eyes:       Sclera injected  Neck: Normal range of motion. Neck supple.  Cardiovascular: Regular rhythm.  Tachycardia present.  Exam reveals no gallop and no friction rub.   No murmur heard. Pulmonary/Chest: Effort normal. He has wheezes. He has no rhonchi. He has no rales.       Minimal wheezing throughout  Abdominal: Soft. Bowel sounds are normal. He exhibits no distension. There is no tenderness.  Musculoskeletal: Normal range of motion.  Normal appearance  Lymphadenopathy:    He has no cervical adenopathy.  Neurological: He is alert and oriented to person, place, and time.       Motor intact in all extremities  Skin: Skin is warm and dry. No rash noted.       Color normal  Psychiatric: He has a normal mood and affect.    ED Course  Procedures Written by Enos Fling acting as scribe for Dr. Freida Busman.  MDM  Pt given pain meds and feels better--no emesis and  repeat abd exam nl--ct results reviewed and pt with PUD, will treat     Date: 02/17/2011  Rate: 110  Rhythm: sinus tachycardia  QRS Axis: normal  Intervals: normal  ST/T Wave abnormalities: normal  Conduction Disutrbances:none  Narrative Interpretation:   Old EKG Reviewed: none available  I personally performed the services described in this documentation, which was scribed in my presence. The recorded information has been reviewed and considered. No att. providers found  Toy Baker, MD 02/17/11 1811  Toy Baker, MD 03/19/11 [phone]

## 2011-02-28 ENCOUNTER — Ambulatory Visit (INDEPENDENT_AMBULATORY_CARE_PROVIDER_SITE_OTHER): Payer: Self-pay | Admitting: Internal Medicine

## 2011-02-28 ENCOUNTER — Encounter (INDEPENDENT_AMBULATORY_CARE_PROVIDER_SITE_OTHER): Payer: Self-pay | Admitting: Internal Medicine

## 2011-02-28 VITALS — BP 104/66 | HR 78 | Temp 98.8°F | Ht 69.0 in | Wt 142.0 lb

## 2011-02-28 DIAGNOSIS — K7010 Alcoholic hepatitis without ascites: Secondary | ICD-10-CM

## 2011-02-28 DIAGNOSIS — K219 Gastro-esophageal reflux disease without esophagitis: Secondary | ICD-10-CM

## 2011-02-28 NOTE — Progress Notes (Deleted)
Subjective: GERD and diarrhea.     Patient ID: Bryan Fleming, male   DOB: 10-Feb-1961, 50 y.o.   MRN: [id number]  Gastrophageal Reflux  Diarrhea      Review of Systems  Gastrointestinal: Positive for diarrhea.       Objective:   Physical Exam     Assessment:     ***    Plan:     ***

## 2011-02-28 NOTE — Patient Instructions (Signed)
1. Continue anti-reflux measures and Zegrid OTC as before. 2. Continue dicyclomine 10 mg up to three times a day as needed.

## 2011-03-01 NOTE — Progress Notes (Signed)
Subjective: for follow-up of GERD and Diarrhea.     Patient ID: Bryan Fleming, male   DOB: Dec 13, 1960, 50 y.o.   MRN: [id number]  HPI Patient is here for scheduled visit accompanied by his wife. He states he feels lot better; he has not had any alcohol in  3 weeks; He states Zegrid worked better [employer]  And he would like to get samples. He ended up in ER on July12,2012 because of N/V  and bad heartburn. He was treated and released. He feels he got sick after eating spicy food. He is having 1 to 2 formed stools per day. He denies abdominal pain. Review of lab from ER visit reveal his SGOT/SGPT were normal.  Review of Systems     Objective: Filed Vitals:   02/28/11 1640  BP: 104/66  Pulse: 78  Temp: 98.8 F (37.1 C)     Physical Exam  Constitutional: He appears well-developed.  HENT:  Mouth/Throat: Oropharynx is clear and moist.  Eyes: Conjunctivae are normal. No scleral icterus.  Neck: No thyromegaly present.  Cardiovascular: Normal rate, regular rhythm and normal heart sounds.   No murmur heard. Pulmonary/Chest: Breath sounds normal.  Abdominal: Soft. There is no tenderness.       No hepatosplenomegaly.  Musculoskeletal: He exhibits no edema.  Lymphadenopathy:    He has no cervical adenopathy.  Neurological: He is alert.  Skin: Skin is warm and dry.        Assessment:     1. Chronic GERD;he is doing well with therapy. 2. Diarrhea; due to IBS and excessive alcohol use; resolved. 3. Alcoholic Hepatitis; His transaminases on February 17, 2011 were normal; recent CT did not show any changes of cirrhosis. Prognosis excellent as long as he does drink alcohol. He is not using librium anymore.    Plan:     Trial with Aciphex 20 mg po daily before breakfast. Samples given. Continue dicyclomine 10 mg three times aday as needed. OV in 3 months.

## 2011-05-02 LAB — ETHANOL: Alcohol, Ethyl (B): <5

## 2011-05-02 LAB — LIPASE, BLOOD: Lipase: 21

## 2011-05-02 LAB — CBC
Hemoglobin: 14.1
MCHC: 34.2
RBC: 4.33
WBC: 18.6 — ABNORMAL HIGH

## 2011-05-02 LAB — DIFFERENTIAL
Basophils Relative: 0
Eosinophils Absolute: 0
Eosinophils Relative: 0
Neutrophils Relative %: 88 — ABNORMAL HIGH

## 2011-05-02 LAB — URINALYSIS, ROUTINE W REFLEX MICROSCOPIC
Glucose, UA: NEGATIVE
Ketones, ur: 40 — AB
Leukocytes, UA: NEGATIVE
Nitrite: NEGATIVE
Specific Gravity, Urine: <1.005 — ABNORMAL LOW
pH: 6

## 2011-05-02 LAB — COMPREHENSIVE METABOLIC PANEL
ALT: 12
AST: 14
Alkaline Phosphatase: 95
CO2: 27
Chloride: 92 — ABNORMAL LOW
GFR calc non Af Amer: >60
Glucose, Bld: 146 — ABNORMAL HIGH
Potassium: 3.9
Sodium: 130 — ABNORMAL LOW

## 2011-05-02 LAB — BASIC METABOLIC PANEL
BUN: 6
Calcium: 8.6
GFR calc non Af Amer: >60
Glucose, Bld: 102 — ABNORMAL HIGH
Potassium: 3.7

## 2011-05-02 LAB — POCT CARDIAC MARKERS: Operator id: 211291

## 2011-05-02 LAB — URINE MICROSCOPIC-ADD ON

## 2011-05-02 LAB — KOH PREP

## 2011-05-02 LAB — MAGNESIUM: Magnesium: 2.3

## 2011-05-02 LAB — URINE CULTURE: Colony Count: 1000

## 2011-05-17 LAB — COMPREHENSIVE METABOLIC PANEL
ALT: 19
AST: 26
Albumin: 3.6
CO2: 24
Calcium: 9
GFR calc Af Amer: >60
GFR calc non Af Amer: >60
Sodium: 126 — ABNORMAL LOW

## 2011-05-17 LAB — CBC
HCT: 42.5
MCHC: 34.3
MCV: 90.8
Platelets: 402 — ABNORMAL HIGH
RDW: 13.6
WBC: 11.9 — ABNORMAL HIGH

## 2011-05-17 LAB — POCT CARDIAC MARKERS
CKMB, poc: <1 — ABNORMAL LOW
Myoglobin, poc: 41
Myoglobin, poc: 43.4
Operator id: 211291
Troponin i, poc: <0.05

## 2011-05-17 LAB — DIFFERENTIAL
Eosinophils Absolute: 0 — ABNORMAL LOW
Eosinophils Relative: 0
Lymphs Abs: 4.1 — ABNORMAL HIGH
Monocytes Absolute: 1.2 — ABNORMAL HIGH
Monocytes Relative: 10

## 2011-05-17 LAB — ETHANOL: Alcohol, Ethyl (B): 49 — ABNORMAL HIGH

## 2011-05-18 ENCOUNTER — Encounter (INDEPENDENT_AMBULATORY_CARE_PROVIDER_SITE_OTHER): Payer: Self-pay | Admitting: *Deleted

## 2011-05-23 LAB — POCT CARDIAC MARKERS
CKMB, poc: 1.2
Troponin i, poc: <0.05

## 2011-05-23 LAB — BASIC METABOLIC PANEL
Calcium: 9
GFR calc Af Amer: >60
GFR calc non Af Amer: >60
Potassium: 3.4 — ABNORMAL LOW
Sodium: 132 — ABNORMAL LOW

## 2011-05-23 LAB — LIPASE, BLOOD: Lipase: 56

## 2011-05-23 LAB — CBC
HCT: 44.7
Hemoglobin: 15.5
RBC: 4.88
WBC: 9.1

## 2011-05-23 LAB — D-DIMER, QUANTITATIVE: D-Dimer, Quant: 2.15 — ABNORMAL HIGH

## 2011-05-23 LAB — CARDIAC PANEL(CRET KIN+CKTOT+MB+TROPI)
CK, MB: 1.2
CK, MB: 1.2
CK, MB: 1.3
Relative Index: 0.8
Relative Index: 0.8
Relative Index: 1
Total CK: 158
Troponin I: 0.03

## 2011-05-23 LAB — DIFFERENTIAL
Basophils Absolute: 0
Eosinophils Relative: 0
Lymphocytes Relative: 13
Lymphs Abs: 1.2
Monocytes Absolute: 0.4
Neutro Abs: 7.4

## 2011-05-23 LAB — HEPATIC FUNCTION PANEL
ALT: 37
AST: 51 — ABNORMAL HIGH
Albumin: 3.5
Alkaline Phosphatase: 63
Bilirubin, Direct: 0.3
Indirect Bilirubin: 1 — ABNORMAL HIGH
Total Bilirubin: 1.3 — ABNORMAL HIGH
Total Protein: 6.6

## 2011-05-23 LAB — TSH: TSH: 1.364

## 2011-05-23 LAB — AMYLASE: Amylase: 95

## 2011-05-23 LAB — LIPID PANEL: Cholesterol: 219 — ABNORMAL HIGH

## 2011-05-23 LAB — H. PYLORI ANTIBODY, IGG: H Pylori IgG: 2.7 — ABNORMAL HIGH

## 2011-05-31 ENCOUNTER — Ambulatory Visit (INDEPENDENT_AMBULATORY_CARE_PROVIDER_SITE_OTHER): Payer: Self-pay | Admitting: Internal Medicine

## 2011-05-31 ENCOUNTER — Encounter (INDEPENDENT_AMBULATORY_CARE_PROVIDER_SITE_OTHER): Payer: Self-pay | Admitting: Internal Medicine

## 2011-05-31 VITALS — BP 100/64 | HR 72 | Temp 98.6°F | Ht 69.0 in | Wt 155.5 lb

## 2011-05-31 DIAGNOSIS — K759 Inflammatory liver disease, unspecified: Secondary | ICD-10-CM

## 2011-05-31 DIAGNOSIS — K219 Gastro-esophageal reflux disease without esophagitis: Secondary | ICD-10-CM

## 2011-05-31 NOTE — Progress Notes (Signed)
Subjective:     Patient ID: Bryan Fleming, male   DOB: Aug 22, 1960, 50 y.o.   MRN: [id number]  HPI Bryan Fleming is a 50 yr old male here today for f/u. He has a hx of chronic GERD and alcoholic hepatitis.He denies rt upper quadrant. Occasionally has epigastric pain if he eats spicy foods.  Liver enzymes on 02/17/2011 were normal. AST 28, ALT 14, and ALP 114. Lipase was elevated 126. Marland Kitchen He is only drink 2 beers twice a week. He says he is almost out of his samples of Aciphex. Hepatitis B Surface Antigen and Hepatitis C antibody was negative.  No melena or bright red rectal bleeding.  Appetite is good. No weight loss. He has 1-2 BMs a day.  No diarrhea.    CT abdomen and pelvis 01/28/2011 :1. Inflammatory thickening of the distal stomach and duodenum,  most severely involving the second part of the duodenum is most  compatible with peptic ulcer disease. Infiltrating neoplasm is  considered unlikely. Follow-up endoscopy should be considered.  2. Atherosclerosis.  3. Probable fatty liver.  4. Thickening of the distal esophagus suggesting gastroesophageal  reflux.  Review of Systems  See hpi Past Medical History  Diagnosis Date  . Hypertension   . COPD (chronic obstructive pulmonary disease) 2007    EMPHYSEMA  . GERD (gastroesophageal reflux disease) 2010  . Hepatitis     Alcoholic    Past Surgical History  Procedure Date  . Cardiac catheterization     4-5 YEARS AGO   History   Social History Narrative  . No narrative on file   Family Status  Relation Status Death Age  . Mother Alive   . Father Deceased   . Sister Alive   . Brother Alive   . Sister Alive   . Sister Alive   . Sister Alive   . Brother Alive   . Brother Alive   . Son Alive   . Daughter Alive       Objective:   Physical Exam.  Ceasar Mons Vitals:   05/31/11 1207  Height: 5\' 9"  (1.753 m)  Weight: 155 lb 8 oz (70.534 kg)    Alert and oriented. Skin warm and dry. Oral mucosa is moist. Natural teeth in good  condition. Sclera anicteric, conjunctivae is pink. Thyroid not enlarged. No cervical lymphadenopathy. Lungs clear. Heart regular rate and rhythm.  Abdomen is soft. Bowel sounds are positive. No hepatomegaly. No abdominal masses felt. No tenderness.  No edema to lower extremities. Patient is alert and oriented.      Assessment:    GERD, Alcohol abuse with hx of pancreatitis  Liver enzymes are normal.  He is presently taking samples of Aciphex.    Plan:    I advised him to stop drinking.  He has reduced etoh considerably.  GERD diet. OV in 3 months. 5 boxes of Aciphex given to patient to try.

## 2011-05-31 NOTE — Patient Instructions (Signed)
Stop etoh. GERD diet given to patient. F/u in 3 months. Aciphex samples given to patient.

## 2011-06-17 ENCOUNTER — Other Ambulatory Visit: Payer: Self-pay | Admitting: Family Medicine

## 2011-07-14 ENCOUNTER — Telehealth (INDEPENDENT_AMBULATORY_CARE_PROVIDER_SITE_OTHER): Payer: Self-pay | Admitting: *Deleted

## 2011-07-14 NOTE — Telephone Encounter (Signed)
Aciphex samples x 10 given to patient. This has been addressed donna '

## 2011-07-14 NOTE — Telephone Encounter (Signed)
Would like to know if he could get some samples of Aciphex. Angelique Blonder can be contacted at [phone]

## 2011-08-04 ENCOUNTER — Other Ambulatory Visit (INDEPENDENT_AMBULATORY_CARE_PROVIDER_SITE_OTHER): Payer: Self-pay | Admitting: Internal Medicine

## 2011-08-10 ENCOUNTER — Encounter (INDEPENDENT_AMBULATORY_CARE_PROVIDER_SITE_OTHER): Payer: Self-pay | Admitting: *Deleted

## 2011-08-24 ENCOUNTER — Ambulatory Visit (INDEPENDENT_AMBULATORY_CARE_PROVIDER_SITE_OTHER): Payer: Self-pay | Admitting: Internal Medicine

## 2011-08-31 ENCOUNTER — Ambulatory Visit (INDEPENDENT_AMBULATORY_CARE_PROVIDER_SITE_OTHER): Payer: Self-pay | Admitting: Internal Medicine

## 2011-08-31 ENCOUNTER — Encounter (INDEPENDENT_AMBULATORY_CARE_PROVIDER_SITE_OTHER): Payer: Self-pay | Admitting: Internal Medicine

## 2011-08-31 VITALS — BP 184/90 | HR 120 | Temp 97.9°F | Ht 68.0 in | Wt 154.4 lb

## 2011-08-31 DIAGNOSIS — K7010 Alcoholic hepatitis without ascites: Secondary | ICD-10-CM

## 2011-08-31 DIAGNOSIS — K219 Gastro-esophageal reflux disease without esophagitis: Secondary | ICD-10-CM

## 2011-08-31 DIAGNOSIS — K759 Inflammatory liver disease, unspecified: Secondary | ICD-10-CM | POA: Insufficient documentation

## 2011-08-31 NOTE — Patient Instructions (Signed)
Advised to stop drinking. Continue present medications. Counseled on smoking cessation.

## 2011-08-31 NOTE — Progress Notes (Signed)
Subjective:     Patient ID: Bryan Fleming, male   DOB: November 11, 1960, 51 y.o.   MRN: [id number]  HPI Bryan Fleming is a 51 yr old male here today for f/u.  He says his acid reflux is controlled with the Aciphex.  He has a hx of alcoholic hepatitis.  Liver enzymes on 02/17/2011 AST 28, ALT 14, ALP 114. Lipase elevated 126.  He has not had any recent hospital admission. His appetite is good.  He has gained 12 pounds since his last visit. He usually about twice a day.  He does tell me he has started drinking again. He is drinking 2  Forty oz beers a day x a couple of weeks. No abdominal pain.  He has a hx of pancreatitis in the past.  Hepatitis B Surface Antigen and Hepatitis C antibody was negative.  He has 1-2 stools a day. No melena or bright red rectal bleeding. His CT of the Abd/pelvis in July revealed probably fatty liver but no cirrhosis.   Abdominal US 01/2011  IMPRESSION:  1. Gallbladder wall thickening, without sonographic Murphy's sign  or pericholecystic fluid. Correlate clinically in assessing for  acalculous cholecystitis.  2. Hypoechoic lesion in the head of the pancreas is smaller than  the previously shown inflammatory mass-like structure in this  region from 2009. This most likely represents residua from  pancreatitis, including possible chronic pseudocyst. Malignancy is  not excluded, but given the reduction in size over the last several  years, seems less likely.  3. There is some hyperechogenicity of the hepatic parenchyma,  which could be a manifestation of hepatic steatosis.    Review of Systems  See hpi    Objective:   Physical Exam  Filed Vitals:   08/31/11 1557  Height: 5\' 8"  (1.727 m)  Weight: 154 lb 6.4 oz (70.035 kg)   Current Outpatient Prescriptions  Medication Sig Dispense Refill  . enalapril-hydrochlorothiazide (VASERETIC) 10-25 MG per tablet Take 1 tablet by mouth daily.        . RABEprazole (ACIPHEX) 20 MG tablet Take 20 mg by mouth daily.        Marland Kitchen  LORazepam (ATIVAN) 0.5 MG tablet TAKE ONE TABLET BY MOUTH THREE TIMES DAILY  90 tablet  0   Past Surgical History  Procedure Date  . Cardiac catheterization     4-5 YEARS AGO   History   Social History  . Marital Status: Married    Spouse Name: N/A    Number of Children: N/A  . Years of Education: N/A   Occupational History  . Not on file.   Social History Main Topics  . Smoking status: Current Everyday Smoker -- 0.1 packs/day for 20 years    Types: Cigarettes  . Smokeless tobacco: Not on file   Comment: 1/2-1 pack a day.   . Alcohol Use: 1.7 oz/week    2 Cans of beer, 1 Drinks containing 0.5 oz of alcohol per week     Couple of beers about twice a week  . Drug Use: No  . Sexually Active: Yes   Other Topics Concern  . Not on file   Social History Narrative  . No narrative on file   Family History  Problem Relation Age of Onset  . Hypertension Mother   . Asthma Father   . Diabetes Sister   . Hypertension Sister   . Hypertension Brother   . Hypertension Sister   . Lupus Daughter    No Known Allergies  Assessment:   GERD controlled at this time.  Etoh abuse with a hx of alcoholic hepatitis.  I have advised him to stop drinking. He was also counseled on his tobacco abouse.    Plan:    f/u 6 months.

## 2012-02-23 ENCOUNTER — Encounter (INDEPENDENT_AMBULATORY_CARE_PROVIDER_SITE_OTHER): Payer: Self-pay | Admitting: *Deleted

## 2012-03-05 ENCOUNTER — Other Ambulatory Visit (INDEPENDENT_AMBULATORY_CARE_PROVIDER_SITE_OTHER): Payer: Self-pay | Admitting: Internal Medicine

## 2012-03-06 ENCOUNTER — Other Ambulatory Visit (INDEPENDENT_AMBULATORY_CARE_PROVIDER_SITE_OTHER): Payer: Self-pay | Admitting: *Deleted

## 2012-03-06 ENCOUNTER — Encounter (HOSPITAL_COMMUNITY): Payer: Self-pay | Admitting: Pharmacy Technician

## 2012-03-06 ENCOUNTER — Encounter (INDEPENDENT_AMBULATORY_CARE_PROVIDER_SITE_OTHER): Payer: Self-pay | Admitting: *Deleted

## 2012-03-06 DIAGNOSIS — K625 Hemorrhage of anus and rectum: Secondary | ICD-10-CM

## 2012-03-07 ENCOUNTER — Ambulatory Visit (INDEPENDENT_AMBULATORY_CARE_PROVIDER_SITE_OTHER): Payer: Self-pay | Admitting: Internal Medicine

## 2012-03-13 MED ORDER — SODIUM CHLORIDE 0.45 % IV SOLN
Freq: Once | INTRAVENOUS | Status: AC
  Administered 2012-03-14: 12:00:00 via INTRAVENOUS

## 2012-03-14 ENCOUNTER — Ambulatory Visit (HOSPITAL_COMMUNITY)
Admission: RE | Admit: 2012-03-14 | Discharge: 2012-03-14 | Disposition: A | Discharge status: Home / Self Care | Payer: Self-pay | Source: Ambulatory Visit | Attending: Internal Medicine | Admitting: Internal Medicine

## 2012-03-14 ENCOUNTER — Encounter (HOSPITAL_COMMUNITY): Payer: Self-pay

## 2012-03-14 ENCOUNTER — Encounter (HOSPITAL_COMMUNITY)
Admission: RE | Disposition: A | Discharge status: Home / Self Care | Payer: Self-pay | Source: Ambulatory Visit | Attending: Internal Medicine

## 2012-03-14 DIAGNOSIS — K5730 Diverticulosis of large intestine without perforation or abscess without bleeding: Secondary | ICD-10-CM

## 2012-03-14 DIAGNOSIS — K921 Melena: Secondary | ICD-10-CM

## 2012-03-14 DIAGNOSIS — K644 Residual hemorrhoidal skin tags: Secondary | ICD-10-CM

## 2012-03-14 DIAGNOSIS — Z79899 Other long term (current) drug therapy: Secondary | ICD-10-CM | POA: Insufficient documentation

## 2012-03-14 DIAGNOSIS — K625 Hemorrhage of anus and rectum: Secondary | ICD-10-CM

## 2012-03-14 DIAGNOSIS — J4489 Other specified chronic obstructive pulmonary disease: Secondary | ICD-10-CM | POA: Insufficient documentation

## 2012-03-14 DIAGNOSIS — J449 Chronic obstructive pulmonary disease, unspecified: Secondary | ICD-10-CM | POA: Insufficient documentation

## 2012-03-14 DIAGNOSIS — I10 Essential (primary) hypertension: Secondary | ICD-10-CM | POA: Insufficient documentation

## 2012-03-14 HISTORY — PX: COLONOSCOPY: SHX5424

## 2012-03-14 SURGERY — COLONOSCOPY
Anesthesia: Moderate Sedation

## 2012-03-14 MED ORDER — STERILE WATER FOR IRRIGATION IR SOLN
Status: DC | PRN
  Administered 2012-03-14: 12:00:00

## 2012-03-14 MED ORDER — MIDAZOLAM HCL 5 MG/5ML IJ SOLN
INTRAMUSCULAR | Status: AC
  Filled 2012-03-14: qty 10

## 2012-03-14 MED ORDER — MEPERIDINE HCL 50 MG/ML IJ SOLN
INTRAMUSCULAR | Status: DC | PRN
  Administered 2012-03-14 (×2): 25 mg via INTRAVENOUS

## 2012-03-14 MED ORDER — HYDROCORTISONE ACETATE 25 MG RE SUPP: 25.0000 mg | Freq: Every day | RECTAL | Status: AC

## 2012-03-14 MED ORDER — MEPERIDINE HCL 50 MG/ML IJ SOLN
INTRAMUSCULAR | Status: AC
  Filled 2012-03-14: qty 1

## 2012-03-14 MED ORDER — MIDAZOLAM HCL 5 MG/5ML IJ SOLN
INTRAMUSCULAR | Status: DC | PRN
  Administered 2012-03-14: 2 mg via INTRAVENOUS
  Administered 2012-03-14: 3 mg via INTRAVENOUS
  Administered 2012-03-14: 2 mg via INTRAVENOUS
  Administered 2012-03-14: 1 mg via INTRAVENOUS
  Administered 2012-03-14: 2 mg via INTRAVENOUS

## 2012-03-14 NOTE — Op Note (Signed)
COLONOSCOPY PROCEDURE REPORT  PATIENT:  Bryan Fleming  MR#:  [id number] Birthdate:  1961-05-10, 51 y.o., male Endoscopist:  Dr. Malissa Hippo, MD Procedure Date: 03/14/2012  Procedure:   Colonoscopy  Indications:  Patient is 51 year old African male was experienced few episodes of hematochezia recently. He denies diarrhea or constipation. He is undergoing diagnostic colonoscopy.  Informed Consent:  The procedure and risks were reviewed with the patient and informed consent was obtained.  Medications:  Demerol 50 mg IV Versed 10 mg IV  Description of procedure:  After a digital rectal exam was performed, that colonoscope was advanced from the anus through the rectum and colon to the area of the cecum, ileocecal valve and appendiceal orifice. The cecum was deeply intubated. These structures were well-seen and photographed for the record. From the level of the cecum and ileocecal valve, the scope was slowly and cautiously withdrawn. The mucosal surfaces were carefully surveyed utilizing scope tip to flexion to facilitate fold flattening as needed. The scope was pulled down into the rectum where a thorough exam including retroflexion was performed.  Findings:   Prep satisfactory. Few small diverticula at sigmoid colon. Normal rectal mucosa. Amaroid below the dentate line and one with erosions.  Therapeutic/Diagnostic Maneuvers Performed:  None  Complications:  None  Cecal Withdrawal Time:  8 minutes  Impression:  Examination performed to cecum. Few small diverticula at sigmoid colon. External hemorrhoids with inflammation/erosions felt to be source of patient's hematochezia.  Recommendations:  Standard instructions given. High fiber diet. Fiber supplement 3-4 g daily. Anusol-HC suppository 1 per rectum daily at bedtime for two weeks  REHMAN,NAJEEB U  03/14/2012 1:02 PM  CC: Dr. Vivia Ewing, MD & Dr. Bonnetta Barry ref. provider found

## 2012-03-14 NOTE — H&P (Signed)
Bryan Fleming is an 51 y.o. male.   Chief Complaint: Patient is here for colonoscopy. HPI: Patient is 51 year old African male presents with few episodes of hematochezia noted with regular bowel movement. He denies abdominal pain diarrhea and/or constipation. He has good appetite and denies recent weight loss. Family history is negative for colorectal carcinoma.  Past Medical History  Diagnosis Date  . Hypertension   . COPD (chronic obstructive pulmonary disease) 2007    EMPHYSEMA  . GERD (gastroesophageal reflux disease) 2010  . Hepatitis     Alcoholic     Past Surgical History  Procedure Date  . Cardiac catheterization     4-5 YEARS AGO  . Wrist ganglion excision 1985    Family History  Problem Relation Age of Onset  . Hypertension Mother   . Asthma Father   . Diabetes Sister   . Hypertension Sister   . Hypertension Brother   . Hypertension Sister   . Lupus Daughter    Social History:  reports that he has been smoking Cigarettes.  He has a 2 pack-year smoking history. He does not have any smokeless tobacco history on file. He reports that he drinks about 1.7 ounces of alcohol per week. He reports that he does not use illicit drugs.  Allergies: No Known Allergies  Medications Prior to Admission  Medication Sig Dispense Refill  . ALPRAZolam (XANAX) 0.25 MG tablet Take by mouth at bedtime as needed.      . enalapril-hydrochlorothiazide (VASERETIC) 10-25 MG per tablet Take 1 tablet by mouth daily.        Marland Kitchen LORazepam (ATIVAN) 0.5 MG tablet Take 1 tablet (0.5 mg total) by mouth every 8 (eight) hours as needed for anxiety.  90 tablet  2    No results found for this or any previous visit (from the past 48 hour(s)). No results found.  ROS  Blood pressure 137/90, pulse 111, temperature 98.3 F (36.8 C), temperature source Oral, resp. rate 23, height 5\' 9"  (1.753 m), weight 160 lb (72.576 kg), SpO2 97.00%. Physical Exam  Constitutional: He is oriented to person,  place, and time. He appears well-developed and well-nourished.  HENT:  Mouth/Throat: Oropharynx is clear and moist.  Eyes: Conjunctivae are normal.  Neck: No thyromegaly present.  Cardiovascular: Normal rate and regular rhythm.   No murmur heard. Respiratory: Effort normal and breath sounds normal.  GI: Soft. He exhibits no distension and no mass. There is no tenderness.  Musculoskeletal: He exhibits no edema.  Lymphadenopathy:    He has no cervical adenopathy.  Neurological: He is alert and oriented to person, place, and time.  Skin: Skin is warm and dry.     Assessment/Plan Hematochezia. Diagnostic colonoscopy.  Oluwatoyin Banales U 03/14/2012, 12:32 PM

## 2012-03-16 ENCOUNTER — Encounter (HOSPITAL_COMMUNITY): Payer: Self-pay | Admitting: Internal Medicine

## 2015-08-13 DIAGNOSIS — I10 Essential (primary) hypertension: Secondary | ICD-10-CM

## 2015-08-13 DIAGNOSIS — J449 Chronic obstructive pulmonary disease, unspecified: Secondary | ICD-10-CM

## 2015-08-20 ENCOUNTER — Encounter: Payer: Self-pay | Admitting: Physician Assistant

## 2015-08-20 ENCOUNTER — Ambulatory Visit: Payer: Self-pay | Admitting: Physician Assistant

## 2015-08-20 VITALS — BP 148/80 | HR 122 | Temp 97.9°F | Ht 68.0 in | Wt 134.9 lb

## 2015-08-20 DIAGNOSIS — Z13220 Encounter for screening for lipoid disorders: Secondary | ICD-10-CM

## 2015-08-20 DIAGNOSIS — Z131 Encounter for screening for diabetes mellitus: Secondary | ICD-10-CM

## 2015-08-20 DIAGNOSIS — F1010 Alcohol abuse, uncomplicated: Secondary | ICD-10-CM

## 2015-08-20 DIAGNOSIS — I10 Essential (primary) hypertension: Secondary | ICD-10-CM

## 2015-08-20 DIAGNOSIS — Z125 Encounter for screening for malignant neoplasm of prostate: Secondary | ICD-10-CM

## 2015-08-20 DIAGNOSIS — Z1329 Encounter for screening for other suspected endocrine disorder: Secondary | ICD-10-CM

## 2015-08-20 DIAGNOSIS — R000 Tachycardia, unspecified: Secondary | ICD-10-CM

## 2015-08-20 DIAGNOSIS — F17218 Nicotine dependence, cigarettes, with other nicotine-induced disorders: Secondary | ICD-10-CM

## 2015-08-20 DIAGNOSIS — Z09 Encounter for follow-up examination after completed treatment for conditions other than malignant neoplasm: Secondary | ICD-10-CM

## 2015-08-20 DIAGNOSIS — F411 Generalized anxiety disorder: Secondary | ICD-10-CM

## 2015-08-20 MED ORDER — ALPRAZOLAM 0.25 MG PO TABS: 0.2500 mg | ORAL_TABLET | Freq: Two times a day (BID) | ORAL | Status: DC | PRN

## 2015-08-20 MED ORDER — METOPROLOL TARTRATE 100 MG PO TABS: 100.0000 mg | ORAL_TABLET | Freq: Two times a day (BID) | ORAL | Status: DC

## 2015-08-20 NOTE — Progress Notes (Signed)
BP 162/88 mmHg  Pulse 138  Temp(Src) 97.9 F (36.6 C)  Ht '5\' 8"'$  (1.727 m)  Wt 134 lb 14.4 oz (61.19 kg)  BMI 20.52 kg/m2  SpO2 96%   Subjective:    Patient ID: Bryan Fleming, male    DOB: 07-Apr-1961, 55 y.o.   MRN: [id number]  HPI: Bryan Fleming is a 55 y.o. male presenting on 08/20/2015 for New Patient (Initial Visit)   HPI Pt used to get medical care from dr across from [facility].  He doesn't remember the provider's name.  Pt has been off his enalapril and lorazepam for about 4 months.  He has been feeling jittery and nervous for several years.  He has been having some SOB.  He used to have an inhaler that he used.  He is a smoker.  He says he has never had chest pains except when he was having reflux.  He says that was years ago.   He has not had that lately.   Pt record shows that he had cath in the past.  He says he doesn't remember who did it or when it was but he thinks it was longer than 5 years ago and it was all fine.  Pt has been drinking 2-3 40 ounce beers every day recently.  States having a lot ov problems with death of wife in 31-Jul-2015.   Relevant past medical, surgical, family and  social history reviewed and updated as indicated. Interim medical history since our last visit reviewed. Allergies and medications reviewed and updated.  No current outpatient prescriptions on file.   Review of Systems  Constitutional: Negative for fever, chills, diaphoresis, appetite change, fatigue and unexpected weight change.  HENT: Positive for dental problem. Negative for congestion, drooling, ear pain, facial swelling, hearing loss, mouth sores, sneezing, sore throat, trouble swallowing and voice change.   Eyes: Negative for pain, discharge, redness, itching and visual disturbance.  Respiratory: Negative for cough, choking, shortness of breath and wheezing.   Cardiovascular: Negative for chest pain, palpitations and leg swelling.  Gastrointestinal: Negative for  vomiting, abdominal pain, diarrhea, constipation and blood in stool.  Endocrine: Negative for cold intolerance, heat intolerance and polydipsia.  Genitourinary: Negative for dysuria, hematuria and decreased urine volume.  Musculoskeletal: Negative for back pain, arthralgias and gait problem.  Skin: Negative for rash.  Allergic/Immunologic: Negative for environmental allergies.  Neurological: Negative for seizures, syncope, light-headedness and headaches.  Hematological: Negative for adenopathy.  Psychiatric/Behavioral: Positive for agitation. Negative for suicidal ideas and dysphoric mood. The patient is nervous/anxious.     Per HPI unless specifically indicated above     Objective:    BP 162/88 mmHg  Pulse 138  Temp(Src) 97.9 F (36.6 C)  Ht '5\' 8"'$  (1.727 m)  Wt 134 lb 14.4 oz (61.19 kg)  BMI 20.52 kg/m2  SpO2 96%  Wt Readings from Last 3 Encounters:  08/20/15 134 lb 14.4 oz (61.19 kg)  03/14/12 160 lb (72.576 kg)  08/31/11 154 lb 6.4 oz (70.035 kg)    Physical Exam  Constitutional: He is oriented to person, place, and time. He appears well-developed and well-nourished.  HENT:  Head: Normocephalic and atraumatic.  Mouth/Throat: Oropharynx is clear and moist. No oropharyngeal exudate.  Eyes: Conjunctivae and EOM are normal. Pupils are equal, round, and reactive to light.  Neck: Neck supple. No thyromegaly present.  Cardiovascular: Regular rhythm, normal heart sounds and normal pulses.   No extrasystoles are present. Tachycardia present.   No murmur  heard. Pulmonary/Chest: Effort normal and breath sounds normal. He has no wheezes. He has no rales.  Abdominal: Soft. Bowel sounds are normal. He exhibits no mass. There is no tenderness.  Musculoskeletal: He exhibits no edema.  Lymphadenopathy:    He has no cervical adenopathy.  Neurological: He is alert and oriented to person, place, and time.  Skin: Skin is warm and dry. No rash noted.  Psychiatric: His speech is normal and  behavior is normal. Thought content normal. His mood appears anxious (mildly).  Vitals reviewed.      EKG- ST at 126. No st-t changes  Assessment & Plan:   Encounter Diagnoses  Name Primary?  . Essential hypertension Yes  . Sinus tachycardia ([place])   . Alcohol abuse   . Anxiety state   . Screening cholesterol level   . Screening for prostate cancer   . Screening for thyroid disorder   . Screening for diabetes mellitus   . Nicotine dependence, cigarettes, with other nicotine-induced disorders    -rx metoprolol and xanax -pt to [facility] TODAY to schedule appointment or go to walk-in. -Get baseline labs drawn -Counseled on drinking and need to stop -f/u 2 wk.  RTO sooner prn.  Counseled pt to go to ER if needed and discussed reasons why he would need to do that.  He states understanding

## 2015-08-20 NOTE — Patient Instructions (Signed)
  rx metoprolol and xanax Call [facility] Get labs Counseled on drinking and need to stop

## 2015-08-21 LAB — CBC WITH DIFFERENTIAL/PLATELET
Basophils Absolute: 0 10*3/uL (ref 0.0–0.1)
Basophils Relative: 0 % (ref 0–1)
Eosinophils Absolute: 0.1 10*3/uL (ref 0.0–0.7)
Eosinophils Relative: 1 % (ref 0–5)
HEMATOCRIT: 36 % — AB (ref 39.0–52.0)
HEMOGLOBIN: 12.3 g/dL — AB (ref 13.0–17.0)
LYMPHS ABS: 2.2 10*3/uL (ref 0.7–4.0)
LYMPHS PCT: 24 % (ref 12–46)
MCH: 33.5 pg (ref 26.0–34.0)
MCHC: 34.2 g/dL (ref 30.0–36.0)
MCV: 98.1 fL (ref 78.0–100.0)
MONO ABS: 0.9 10*3/uL (ref 0.1–1.0)
MONOS PCT: 10 % (ref 3–12)
MPV: 9.4 fL (ref 8.6–12.4)
NEUTROS ABS: 5.9 10*3/uL (ref 1.7–7.7)
NEUTROS PCT: 65 % (ref 43–77)
Platelets: 205 10*3/uL (ref 150–400)
RBC: 3.67 MIL/uL — ABNORMAL LOW (ref 4.22–5.81)
RDW: 14.4 % (ref 11.5–15.5)
WBC: 9 10*3/uL (ref 4.0–10.5)

## 2015-08-21 LAB — LIPID PANEL
CHOL/HDL RATIO: 2.9 ratio (ref <=5.0)
Cholesterol: 174 mg/dL (ref 125–200)
HDL: 59 mg/dL (ref >=40)
LDL CALC: 90 mg/dL (ref <130)
Triglycerides: 126 mg/dL (ref <150)
VLDL: 25 mg/dL (ref <30)

## 2015-08-21 LAB — COMPLETE METABOLIC PANEL WITH GFR
ALT: 24 U/L (ref 9–46)
AST: 76 U/L — ABNORMAL HIGH (ref 10–35)
Albumin: 3.9 g/dL (ref 3.6–5.1)
Alkaline Phosphatase: 74 U/L (ref 40–115)
BILIRUBIN TOTAL: 0.5 mg/dL (ref 0.2–1.2)
BUN: 5 mg/dL — ABNORMAL LOW (ref 7–25)
CALCIUM: 9.2 mg/dL (ref 8.6–10.3)
CO2: 28 mmol/L (ref 20–31)
CREATININE: 0.58 mg/dL — AB (ref 0.70–1.33)
Chloride: 99 mmol/L (ref 98–110)
Glucose, Bld: 99 mg/dL (ref 65–99)
Potassium: 3.8 mmol/L (ref 3.5–5.3)
Sodium: 137 mmol/L (ref 135–146)
TOTAL PROTEIN: 7.1 g/dL (ref 6.1–8.1)

## 2015-08-21 LAB — HEMOGLOBIN A1C
Hgb A1c MFr Bld: 5.2 % (ref <5.7)
MEAN PLASMA GLUCOSE: 103 mg/dL (ref <117)

## 2015-08-21 LAB — TSH: TSH: 1.29 u[IU]/mL (ref 0.350–4.500)

## 2015-08-22 LAB — PSA: PSA: 0.82 ng/mL (ref <=4.00)

## 2015-08-30 DIAGNOSIS — F1010 Alcohol abuse, uncomplicated: Secondary | ICD-10-CM | POA: Insufficient documentation

## 2015-08-30 DIAGNOSIS — F17218 Nicotine dependence, cigarettes, with other nicotine-induced disorders: Secondary | ICD-10-CM | POA: Insufficient documentation

## 2015-08-30 DIAGNOSIS — F418 Other specified anxiety disorders: Secondary | ICD-10-CM | POA: Insufficient documentation

## 2015-08-30 DIAGNOSIS — F411 Generalized anxiety disorder: Secondary | ICD-10-CM | POA: Insufficient documentation

## 2015-09-03 ENCOUNTER — Encounter: Payer: Self-pay | Admitting: Physician Assistant

## 2015-09-03 ENCOUNTER — Ambulatory Visit: Payer: Self-pay | Admitting: Physician Assistant

## 2015-09-03 VITALS — BP 132/76 | HR 91 | Temp 98.1°F | Ht 68.0 in | Wt 134.1 lb

## 2015-09-03 DIAGNOSIS — I10 Essential (primary) hypertension: Secondary | ICD-10-CM

## 2015-09-03 DIAGNOSIS — F17218 Nicotine dependence, cigarettes, with other nicotine-induced disorders: Secondary | ICD-10-CM

## 2015-09-03 DIAGNOSIS — F411 Generalized anxiety disorder: Secondary | ICD-10-CM

## 2015-09-03 DIAGNOSIS — F1010 Alcohol abuse, uncomplicated: Secondary | ICD-10-CM

## 2015-09-03 DIAGNOSIS — K219 Gastro-esophageal reflux disease without esophagitis: Secondary | ICD-10-CM

## 2015-09-03 MED ORDER — RANITIDINE HCL 300 MG PO CAPS: 300.0000 mg | ORAL_CAPSULE | Freq: Every evening | ORAL | Status: DC

## 2015-09-03 MED ORDER — RANITIDINE HCL 300 MG PO TABS: 300.0000 mg | ORAL_TABLET | Freq: Every day | ORAL | Status: DC

## 2015-09-03 NOTE — Progress Notes (Signed)
BP 132/76 mmHg  Pulse 91  Temp(Src) 98.1 F (36.7 C)  Ht '5\' 8"'  (1.727 m)  Wt 134 lb 1.6 oz (60.827 kg)  BMI 20.39 kg/m2  SpO2 94%   Subjective:    Patient ID: Bryan Fleming, male    DOB: 1960-08-18, 55 y.o.   MRN: [id number]  HPI: Bryan Fleming is a 55 y.o. male presenting on 09/03/2015 for Hypertension; Tachycardia; Drug / Alcohol Assessment; Insomnia; and Gastroesophageal Reflux   HPI Pt did not call [facility] as instructed at last OV.  Says he's been busy although he has no job.  Still having nerves but not as bad as at previous OV.  Relevant past medical, surgical, family and social history reviewed and updated as indicated. Interim medical history since our last visit reviewed. Allergies and medications reviewed and updated.   Current outpatient prescriptions:  .  ALPRAZolam (XANAX) 0.25 MG tablet, Take 1 tablet (0.25 mg total) by mouth 2 (two) times daily as needed for anxiety., Disp: 10 tablet, Rfl: 0 .  metoprolol (LOPRESSOR) 100 MG tablet, Take 1 tablet (100 mg total) by mouth 2 (two) times daily. (Patient taking differently: Take 100 mg by mouth daily. ), Disp: 60 tablet, Rfl: 1   Review of Systems  Constitutional: Positive for appetite change and fatigue. Negative for fever, chills, diaphoresis and unexpected weight change.  HENT: Positive for dental problem. Negative for congestion, drooling, ear pain, facial swelling, hearing loss, mouth sores, sneezing, sore throat, trouble swallowing and voice change.   Eyes: Positive for redness and itching. Negative for pain, discharge and visual disturbance.  Respiratory: Positive for cough, shortness of breath and wheezing. Negative for choking.   Cardiovascular: Negative for chest pain, palpitations and leg swelling.  Gastrointestinal: Positive for abdominal pain. Negative for vomiting, diarrhea, constipation and blood in stool.  Endocrine: Negative for cold intolerance, heat intolerance and polydipsia.   Genitourinary: Negative for dysuria, hematuria and decreased urine volume.  Musculoskeletal: Negative for back pain, arthralgias and gait problem.  Skin: Negative for rash.  Allergic/Immunologic: Positive for environmental allergies.  Neurological: Negative for seizures, syncope, light-headedness and headaches.  Hematological: Negative for adenopathy.  Psychiatric/Behavioral: Positive for dysphoric mood. Negative for suicidal ideas and agitation. The patient is nervous/anxious.     Per HPI unless specifically indicated above     Objective:    BP 132/76 mmHg  Pulse 91  Temp(Src) 98.1 F (36.7 C)  Ht '5\' 8"'  (1.727 m)  Wt 134 lb 1.6 oz (60.827 kg)  BMI 20.39 kg/m2  SpO2 94%  Wt Readings from Last 3 Encounters:  09/03/15 134 lb 1.6 oz (60.827 kg)  08/20/15 134 lb 14.4 oz (61.19 kg)  03/14/12 160 lb (72.576 kg)    Physical Exam  Constitutional: He is oriented to person, place, and time. He appears well-developed and well-nourished.  HENT:  Head: Normocephalic and atraumatic.  Neck: Neck supple.  Cardiovascular: Normal rate and regular rhythm.   Pulmonary/Chest: Effort normal and breath sounds normal. He has no wheezes.  Abdominal: Soft. Bowel sounds are normal. There is no hepatosplenomegaly. There is no tenderness.  Musculoskeletal: He exhibits no edema.  Lymphadenopathy:    He has no cervical adenopathy.  Neurological: He is alert and oriented to person, place, and time.  Skin: Skin is warm and dry.  Psychiatric: He has a normal mood and affect. His behavior is normal.  Vitals reviewed.   Results for orders placed or performed in visit on 08/20/15  COMPLETE METABOLIC PANEL WITH  GFR  Result Value Ref Range   Sodium 137 135 - 146 mmol/L   Potassium 3.8 3.5 - 5.3 mmol/L   Chloride 99 98 - 110 mmol/L   CO2 28 20 - 31 mmol/L   Glucose, Bld 99 65 - 99 mg/dL   BUN 5 (L) 7 - 25 mg/dL   Creat 0.58 (L) 0.70 - 1.33 mg/dL   Total Bilirubin 0.5 0.2 - 1.2 mg/dL   Alkaline  Phosphatase 74 40 - 115 U/L   AST 76 (H) 10 - 35 U/L   ALT 24 9 - 46 U/L   Total Protein 7.1 6.1 - 8.1 g/dL   Albumin 3.9 3.6 - 5.1 g/dL   Calcium 9.2 8.6 - 10.3 mg/dL   GFR, Est African American >89 >=60 mL/min   GFR, Est Non African American >89 >=60 mL/min  CBC w/Diff/Platelet  Result Value Ref Range   WBC 9.0 4.0 - 10.5 K/uL   RBC 3.67 (L) 4.22 - 5.81 MIL/uL   Hemoglobin 12.3 (L) 13.0 - 17.0 g/dL   HCT 36.0 (L) 39.0 - 52.0 %   MCV 98.1 78.0 - 100.0 fL   MCH 33.5 26.0 - 34.0 pg   MCHC 34.2 30.0 - 36.0 g/dL   RDW 14.4 11.5 - 15.5 %   Platelets 205 150 - 400 K/uL   MPV 9.4 8.6 - 12.4 fL   Neutrophils Relative % 65 43 - 77 %   Neutro Abs 5.9 1.7 - 7.7 K/uL   Lymphocytes Relative 24 12 - 46 %   Lymphs Abs 2.2 0.7 - 4.0 K/uL   Monocytes Relative 10 3 - 12 %   Monocytes Absolute 0.9 0.1 - 1.0 K/uL   Eosinophils Relative 1 0 - 5 %   Eosinophils Absolute 0.1 0.0 - 0.7 K/uL   Basophils Relative 0 0 - 1 %   Basophils Absolute 0.0 0.0 - 0.1 K/uL   Smear Review Criteria for review not met   Lipid Profile  Result Value Ref Range   Cholesterol 174 125 - 200 mg/dL   Triglycerides 126 <150 mg/dL   HDL 59 >=40 mg/dL   Total CHOL/HDL Ratio 2.9 <=5.0 Ratio   VLDL 25 <30 mg/dL   LDL Cholesterol 90 <130 mg/dL  HgB A1c  Result Value Ref Range   Hgb A1c MFr Bld 5.2 <5.7 %   Mean Plasma Glucose 103 <117 mg/dL  PSA  Result Value Ref Range   PSA 0.82 <=4.00 ng/mL  TSH  Result Value Ref Range   TSH 1.290 0.350 - 4.500 uIU/mL      Assessment & Plan:   Encounter Diagnoses  Name Primary?  . Essential hypertension Yes  . Anxiety state   . Alcohol abuse   . Nicotine dependence, cigarettes, with other nicotine-induced disorders   . Gastroesophageal reflux disease, esophagitis presence not specified     -reviewed labs with pt -counseled pt on proper way to Take metoprolol bid  -urged pt to Stop etoh. Gave AA information and [facility] contact information -reminded him to Call  [facility] -Ranitidine for gerd -f/u 6 wk.  RTO sooner prn

## 2015-09-17 NOTE — Congregational Nurse Program (Signed)
Congregational Nurse Program Note  Date of Encounter: 08/20/2015  Past Medical History: Past Medical History  Diagnosis Date  . Hypertension   . COPD (chronic obstructive pulmonary disease) ([place]) 2007    EMPHYSEMA  . GERD (gastroesophageal reflux disease) 2010  . Hepatitis     Alcoholic     Encounter Details:     CNP Questionnaire - 08/20/15 1038    Patient Demographics   Is this a new or existing patient? Existing   Patient is considered a/an Not Applicable   Race African-American/Black   Patient Assistance   Location of Patient Assistance [facility], [place]   Patient's financial/insurance status Low Income   Uninsured Patient Yes   Interventions Counseled to make appt. with provider;Assisted patient in making appt.;Appt. has been completed   Patient referred to apply for the following financial assistance [place] insecurities addressed Referred to food bank or resource   Transportation assistance No   Assistance securing medications No   Educational health offerings Chronic disease;Hypertension;Navigating the healthcare system;Medications;Nutrition;Safety   Encounter Details   Primary purpose of visit Chronic Illness/Condition Visit;Navigating the Healthcare System;End of Life/Grief Visit;Spiritual Care/Support Visit;Post PCP Visit;Safety   Was an Emergency Department visit averted? Not Applicable   Does patient have a medical provider? Yes   Patient referred to Clinic;Establish PCP;Doctor referral for a non-emergent behavioral health crisis;Follow up with established PCP   Was a mental health screening completed? (GAINS tool) No   Does patient have dental issues? No   Does patient have vision issues? Yes   Was a vision referral made? No   Since previous encounter, have you referred patient for abnormal blood pressure that resulted in a new diagnosis or medication change? Yes   Since previous encounter, have you referred patient for abnormal  blood glucose that resulted in a new diagnosis or medication change? No     followed up with client after his initial PCP appointment . Client states that appointment went well and they sent him for lab work and will get him started on medications and see if he qualifies for any medication assistance. Again , inquired if client would like to make an appointment to meet with [employer] MSW intern , client states that he does not want to do that at this time. Will follow up with client PRN.

## 2015-09-17 NOTE — Congregational Nurse Program (Signed)
Congregational Nurse Program Note  Date of Encounter: 08/13/2015  Past Medical History: Past Medical History  Diagnosis Date  . Hypertension   . COPD (chronic obstructive pulmonary disease) ([place]) 2007    EMPHYSEMA  . GERD (gastroesophageal reflux disease) 2010  . Hepatitis     Alcoholic     Encounter Details:    New client to [facility]. Client is uninsured, no income at present and has not had a medical provider in greater than 1 year. He lives alone, but does have transportation. No Dental in greater than 1 year. Denies dental pain at present. Wears glasses. Currently denies any chest pain, but reports history of COPD , however does not have inhalers presently and he cannot remember which ones he has taken. He also reports history of Hypertension , but again does not remember which medication he took. He reports current use of maalox for "heart burn" and acid reflux. He is a current everyday smoker ,reports 1ppd of cigarettes. He also reports that drinks a 24 ounce beer daily. Client reports that has increased since the unexpected loss of his wife on December 5,2016. Client very tearful and keeps saying "it was so sudden, I haven't had time to deal with that."  Client reports that recently he has a problem with his feet getting "numb" He does report a family history of DM. Denies any other pain at present. Discussed with client the possibility of having some counseling for his recent grief and his alcohol use. Client wants to call in private at home, contact information given for [employer] for mental health assistance. RN also offered to refer client to [place] MSW intern for counseling and case management, however he declined this service at present. Denies suicidal thoughts currently, denies hallucinations or thoughts of harming others. States he is just in "shock and sad about losing his wife." Reinforced with client the benefits of having someone to talk to about his loss. Client  states he will call [employer] when he gets home. Client does not currently receive food stamps, referred client to the [employer] today for food assistance, he did receive food today. Discussed with client the need and benefits of having a medical provider to manage his hypertension and COPD as well as preventative care. Client wishes to go to the [facility] [place]. Called to secure appointment for client's first appointment on January 12th, 2017. Informed client that after that he will be able to schedule his own follow-up appointments, but that we are here to help with any other needs. Contact information for RN given to client. Discussed importance of smoking cessation as well has alcohol use, client states understanding, but not ready at this point. Discussed with hypertension the need to limit his salt intake and tips on doing so , also discussed the need for a healthy diet and support. Client states that he has a supportive family that helps him out and could help with medications if needed. Will follow up with client for further needs after his intial medical appointment.

## 2015-10-15 ENCOUNTER — Ambulatory Visit: Payer: Self-pay | Admitting: Physician Assistant

## 2015-11-02 ENCOUNTER — Encounter: Payer: Self-pay | Admitting: Physician Assistant

## 2015-11-02 ENCOUNTER — Ambulatory Visit: Payer: Self-pay | Admitting: Physician Assistant

## 2015-11-02 VITALS — BP 132/74 | HR 82 | Temp 97.5°F | Ht 68.0 in | Wt 137.1 lb

## 2015-11-02 DIAGNOSIS — F411 Generalized anxiety disorder: Secondary | ICD-10-CM

## 2015-11-02 DIAGNOSIS — F17218 Nicotine dependence, cigarettes, with other nicotine-induced disorders: Secondary | ICD-10-CM

## 2015-11-02 DIAGNOSIS — I10 Essential (primary) hypertension: Secondary | ICD-10-CM

## 2015-11-02 DIAGNOSIS — J449 Chronic obstructive pulmonary disease, unspecified: Secondary | ICD-10-CM

## 2015-11-02 DIAGNOSIS — K219 Gastro-esophageal reflux disease without esophagitis: Secondary | ICD-10-CM

## 2015-11-02 MED ORDER — ALBUTEROL SULFATE HFA 108 (90 BASE) MCG/ACT IN AERS: 2.0000 | INHALATION_SPRAY | Freq: Four times a day (QID) | RESPIRATORY_TRACT | Status: DC | PRN

## 2015-11-02 NOTE — Progress Notes (Signed)
BP 132/74 mmHg  Pulse 82  Temp(Src) 97.5 F (36.4 C)  Ht '5\' 8"'$  (1.727 m)  Wt 137 lb 1.6 oz (62.188 kg)  BMI 20.85 kg/m2  SpO2 99%   Subjective:    Patient ID: Bryan Fleming, male    DOB: Mar 10, 1961, 55 y.o.   MRN: [id number]  HPI: Bryan RACZKOWSKI is a 55 y.o. male presenting on 11/02/2015 for Hypertension and Gastroesophageal Reflux   HPI  .  Pt says he went through detox at [facility] and then thru [facility].  He has an appointment at [facility] on this Friday.   He is also taking a medicine for "the cravings".- he doesn't know name of it and he didn't bring his meds with him today.   Relevant past medical, surgical, family and social history reviewed and updated as indicated. Interim medical history since our last visit reviewed. Allergies and medications reviewed and updated.  Current outpatient prescriptions:  .  ALPRAZolam (XANAX) 0.25 MG tablet, Take 1 tablet (0.25 mg total) by mouth 2 (two) times daily as needed for anxiety., Disp: 10 tablet, Rfl: 0 .  metoprolol (LOPRESSOR) 100 MG tablet, Take 1 tablet (100 mg total) by mouth 2 (two) times daily., Disp: 60 tablet, Rfl: 1 .  ranitidine (ZANTAC) 300 MG tablet, Take 1 tablet (300 mg total) by mouth at bedtime., Disp: 30 tablet, Rfl: 1  Review of Systems  Respiratory: Positive for shortness of breath and wheezing.   Cardiovascular: Negative for chest pain.  Gastrointestinal: Negative for abdominal pain.  Psychiatric/Behavioral: Positive for sleep disturbance and dysphoric mood. Negative for suicidal ideas. The patient is nervous/anxious.       Per HPI unless specifically indicated above     Objective:    BP 132/74 mmHg  Pulse 82  Temp(Src) 97.5 F (36.4 C)  Ht '5\' 8"'$  (1.727 m)  Wt 137 lb 1.6 oz (62.188 kg)  BMI 20.85 kg/m2  SpO2 99%  Wt Readings from Last 3 Encounters:  11/02/15 137 lb 1.6 oz (62.188 kg)  09/03/15 134 lb 1.6 oz (60.827 kg)  08/20/15 134 lb 14.4 oz (61.19 kg)    Physical Exam   Constitutional: He is oriented to person, place, and time. He appears well-developed and well-nourished.  HENT:  Head: Normocephalic and atraumatic.  Neck: Neck supple.  Cardiovascular: Normal rate and regular rhythm.   Pulmonary/Chest: Effort normal. No respiratory distress. He has wheezes. He has no rales.  Abdominal: Soft. Bowel sounds are normal. There is no hepatosplenomegaly. There is no tenderness.  Musculoskeletal: He exhibits no edema.  Lymphadenopathy:    He has no cervical adenopathy.  Neurological: He is alert and oriented to person, place, and time.  Skin: Skin is warm and dry.  Psychiatric: He has a normal mood and affect. His behavior is normal.  Vitals reviewed.       Assessment & Plan:   Encounter Diagnoses  Name Primary?  . Essential hypertension Yes  . Gastroesophageal reflux disease, esophagitis presence not specified   . Chronic obstructive pulmonary disease, unspecified COPD type ([place])   . Nicotine dependence, cigarettes, with other nicotine-induced disorders   . Anxiety state     -Counseled pt on smoking cessation and relationship b/n smoking and sob. -pt to go to [facility] this Friday as scheduled.   -recommend otc vit b-12 and folate -Order inhaler for pt/get pt signed up for [facility] -F/u 3 months. RTO sooner prn. Pt will need labs - including hepatitis- at that time (will  wait to order until he comes in for appt)

## 2015-11-02 NOTE — Patient Instructions (Addendum)
  Take otc Vitamin B-12 and otc Folate.

## 2016-01-24 ENCOUNTER — Other Ambulatory Visit: Payer: Self-pay | Admitting: Physician Assistant

## 2016-01-24 MED ORDER — METOPROLOL TARTRATE 100 MG PO TABS: 100.0000 mg | ORAL_TABLET | Freq: Two times a day (BID) | ORAL | Status: DC

## 2016-01-25 ENCOUNTER — Other Ambulatory Visit: Payer: Self-pay | Admitting: Physician Assistant

## 2016-01-25 DIAGNOSIS — D649 Anemia, unspecified: Secondary | ICD-10-CM

## 2016-01-25 DIAGNOSIS — I10 Essential (primary) hypertension: Secondary | ICD-10-CM

## 2016-01-25 DIAGNOSIS — R7989 Other specified abnormal findings of blood chemistry: Secondary | ICD-10-CM

## 2016-01-25 DIAGNOSIS — R945 Abnormal results of liver function studies: Secondary | ICD-10-CM

## 2016-01-25 MED ORDER — RANITIDINE HCL 300 MG PO TABS: 300.0000 mg | ORAL_TABLET | Freq: Every day | ORAL | Status: DC

## 2016-01-25 MED ORDER — METOPROLOL TARTRATE 100 MG PO TABS
100.0000 mg | ORAL_TABLET | Freq: Two times a day (BID) | ORAL | Status: DC
Start: 2016-01-25 — End: 2016-06-28

## 2016-02-02 ENCOUNTER — Ambulatory Visit: Payer: Self-pay | Admitting: Physician Assistant

## 2016-02-02 ENCOUNTER — Encounter: Payer: Self-pay | Admitting: Physician Assistant

## 2016-02-02 VITALS — BP 120/72 | HR 99 | Temp 98.1°F | Ht 68.0 in | Wt 151.8 lb

## 2016-02-02 DIAGNOSIS — F411 Generalized anxiety disorder: Secondary | ICD-10-CM

## 2016-02-02 DIAGNOSIS — R945 Abnormal results of liver function studies: Secondary | ICD-10-CM

## 2016-02-02 DIAGNOSIS — D649 Anemia, unspecified: Secondary | ICD-10-CM

## 2016-02-02 DIAGNOSIS — I10 Essential (primary) hypertension: Secondary | ICD-10-CM

## 2016-02-02 DIAGNOSIS — J449 Chronic obstructive pulmonary disease, unspecified: Secondary | ICD-10-CM

## 2016-02-02 DIAGNOSIS — F17218 Nicotine dependence, cigarettes, with other nicotine-induced disorders: Secondary | ICD-10-CM

## 2016-02-02 DIAGNOSIS — R7989 Other specified abnormal findings of blood chemistry: Secondary | ICD-10-CM

## 2016-02-02 LAB — HEMATOCRIT: HEMATOCRIT: 43.3 % (ref 38.5–50.0)

## 2016-02-02 LAB — COMPLETE METABOLIC PANEL WITH GFR
ALT: 32 U/L (ref 9–46)
AST: 92 U/L — AB (ref 10–35)
Albumin: 4.4 g/dL (ref 3.6–5.1)
Alkaline Phosphatase: 71 U/L (ref 40–115)
BUN: 12 mg/dL (ref 7–25)
CALCIUM: 10.1 mg/dL (ref 8.6–10.3)
CHLORIDE: 98 mmol/L (ref 98–110)
CO2: 28 mmol/L (ref 20–31)
CREATININE: 0.8 mg/dL (ref 0.70–1.33)
GFR, Est African American: >89 mL/min (ref >=60)
GFR, Est Non African American: >89 mL/min (ref >=60)
GLUCOSE: 114 mg/dL — AB (ref 65–99)
Potassium: 4.4 mmol/L (ref 3.5–5.3)
SODIUM: 137 mmol/L (ref 135–146)
Total Bilirubin: 0.5 mg/dL (ref 0.2–1.2)
Total Protein: 7.6 g/dL (ref 6.1–8.1)

## 2016-02-02 LAB — HEMOGLOBIN: HEMOGLOBIN: 15.2 g/dL (ref 13.2–17.1)

## 2016-02-02 NOTE — Progress Notes (Signed)
BP 120/72 mmHg  Pulse 99  Temp(Src) 98.1 F (36.7 C)  Ht '5\' 8"'$  (1.727 m)  Wt 151 lb 12.8 oz (68.856 kg)  BMI 23.09 kg/m2  SpO2 97%   Subjective:    Patient ID: Bryan Fleming, male    DOB: [birth year], 55 y.o.   MRN: [id number]  HPI: Bryan Fleming is a 55 y.o. male presenting on 02/02/2016 for Hypertension; Gastroesophageal Reflux; and Medication Refill   HPI  Chief Complaint  Patient presents with  . Hypertension  . Gastroesophageal Reflux  . Medication Refill    pt states he would like some more xanax to take as needed    Pt is still going to [facility]  He still gets some DOE but not usually sob.  Still smoking.  He uses his inhaler usu twice/week  Relevant past medical, surgical, family and social history reviewed and updated as indicated. Interim medical history since our last visit reviewed. Allergies and medications reviewed and updated.   Current outpatient prescriptions:  .  albuterol (PROVENTIL HFA;VENTOLIN HFA) 108 (90 Base) MCG/ACT inhaler, Inhale 2 puffs into the lungs every 6 (six) hours as needed for wheezing or shortness of breath., Disp: 3 Inhaler, Rfl: 1 .  aspirin 81 MG tablet, Take 81 mg by mouth daily., Disp: , Rfl:  .  citalopram (CELEXA) 20 MG tablet, Take 20 mg by mouth daily., Disp: , Rfl:  .  Cyanocobalamin 2500 MCG TABS, Take 1 tablet by mouth daily., Disp: , Rfl:  .  metoprolol (LOPRESSOR) 100 MG tablet, Take 1 tablet (100 mg total) by mouth 2 (two) times daily., Disp: 60 tablet, Rfl: 0 .  ranitidine (ZANTAC) 300 MG tablet, Take 1 tablet (300 mg total) by mouth at bedtime., Disp: 30 tablet, Rfl: 0 .  traZODone (DESYREL) 100 MG tablet, Take 100 mg by mouth at bedtime., Disp: , Rfl:    Review of Systems  Constitutional: Positive for appetite change and fatigue. Negative for fever, chills, diaphoresis and unexpected weight change.  HENT: Positive for congestion and dental problem. Negative for drooling, ear pain, facial swelling,  hearing loss, mouth sores, sneezing, sore throat, trouble swallowing and voice change.   Eyes: Positive for discharge, redness and itching. Negative for pain and visual disturbance.  Respiratory: Positive for chest tightness, shortness of breath and wheezing. Negative for cough and choking.   Cardiovascular: Negative for chest pain, palpitations and leg swelling.  Gastrointestinal: Negative for vomiting, abdominal pain, diarrhea, constipation and blood in stool.  Endocrine: Negative for cold intolerance, heat intolerance and polydipsia.  Genitourinary: Negative for dysuria, hematuria and decreased urine volume.  Musculoskeletal: Negative for back pain, arthralgias and gait problem.  Skin: Negative for rash.  Allergic/Immunologic: Positive for environmental allergies.  Neurological: Negative for seizures, syncope, light-headedness and headaches.  Hematological: Negative for adenopathy.  Psychiatric/Behavioral: Negative for suicidal ideas and agitation. The patient is nervous/anxious.     Per HPI unless specifically indicated above     Objective:    BP 120/72 mmHg  Pulse 99  Temp(Src) 98.1 F (36.7 C)  Ht '5\' 8"'$  (1.727 m)  Wt 151 lb 12.8 oz (68.856 kg)  BMI 23.09 kg/m2  SpO2 97%  Wt Readings from Last 3 Encounters:  02/02/16 151 lb 12.8 oz (68.856 kg)  11/02/15 137 lb 1.6 oz (62.188 kg)  09/03/15 134 lb 1.6 oz (60.827 kg)    Physical Exam  Constitutional: He is oriented to person, place, and time. He appears well-developed and well-nourished.  HENT:  Head: Normocephalic and atraumatic.  Neck: Neck supple.  Cardiovascular: Normal rate and regular rhythm.   Pulmonary/Chest: Effort normal and breath sounds normal. He has no wheezes.  Abdominal: Soft. Bowel sounds are normal. There is no hepatosplenomegaly. There is no tenderness.  Musculoskeletal: He exhibits no edema.  Lymphadenopathy:    He has no cervical adenopathy.  Neurological: He is alert and oriented to person, place,  and time.  Skin: Skin is warm and dry.  Psychiatric: He has a normal mood and affect. His behavior is normal.  Vitals reviewed.       Assessment & Plan:   Encounter Diagnoses  Name Primary?  . Essential hypertension, benign   . Elevated liver function tests   . Anemia, unspecified anemia type   . Chronic obstructive pulmonary disease, unspecified COPD type ([place]) Yes  . Nicotine dependence, cigarettes, with other nicotine-induced disorders   . Anxiety state      -pt to Get labs drawn today.  Will call with results -counseled on smoking cessation -discussed with pt that he needs to talk with his [facility] provider about any benzos that he is wanting. He states understanding -F/u 4 months.  RTO sooner prn

## 2016-02-03 ENCOUNTER — Other Ambulatory Visit: Payer: Self-pay | Admitting: Physician Assistant

## 2016-02-03 DIAGNOSIS — R945 Abnormal results of liver function studies: Principal | ICD-10-CM

## 2016-02-03 DIAGNOSIS — R7989 Other specified abnormal findings of blood chemistry: Secondary | ICD-10-CM

## 2016-02-03 LAB — HEPATITIS PANEL, ACUTE
HCV Ab: NEGATIVE
HEP B S AG: NEGATIVE
Hep A IgM: NONREACTIVE
Hep B C IgM: NONREACTIVE

## 2016-02-16 ENCOUNTER — Other Ambulatory Visit: Payer: Self-pay | Admitting: Physician Assistant

## 2016-03-21 ENCOUNTER — Emergency Department (HOSPITAL_COMMUNITY)
Admission: EM | Admit: 2016-03-21 | Discharge: 2016-03-22 | Disposition: A | Discharge status: Home / Self Care | Payer: Self-pay | Attending: Emergency Medicine | Admitting: Emergency Medicine

## 2016-03-21 ENCOUNTER — Encounter (HOSPITAL_COMMUNITY): Payer: Self-pay | Admitting: Emergency Medicine

## 2016-03-21 DIAGNOSIS — Z7982 Long term (current) use of aspirin: Secondary | ICD-10-CM | POA: Insufficient documentation

## 2016-03-21 DIAGNOSIS — R112 Nausea with vomiting, unspecified: Secondary | ICD-10-CM | POA: Insufficient documentation

## 2016-03-21 DIAGNOSIS — Z79899 Other long term (current) drug therapy: Secondary | ICD-10-CM | POA: Insufficient documentation

## 2016-03-21 DIAGNOSIS — J449 Chronic obstructive pulmonary disease, unspecified: Secondary | ICD-10-CM | POA: Insufficient documentation

## 2016-03-21 DIAGNOSIS — F17210 Nicotine dependence, cigarettes, uncomplicated: Secondary | ICD-10-CM | POA: Insufficient documentation

## 2016-03-21 DIAGNOSIS — I10 Essential (primary) hypertension: Secondary | ICD-10-CM | POA: Insufficient documentation

## 2016-03-21 LAB — COMPREHENSIVE METABOLIC PANEL
ALT: 53 U/L (ref 17–63)
AST: 96 U/L — AB (ref 15–41)
Albumin: 4.9 g/dL (ref 3.5–5.0)
Alkaline Phosphatase: 68 U/L (ref 38–126)
Anion gap: 22 — ABNORMAL HIGH (ref 5–15)
BUN: 16 mg/dL (ref 6–20)
CHLORIDE: 92 mmol/L — AB (ref 101–111)
CO2: 17 mmol/L — AB (ref 22–32)
Calcium: 8.9 mg/dL (ref 8.9–10.3)
Creatinine, Ser: 0.97 mg/dL (ref 0.61–1.24)
GFR calc Af Amer: >60 mL/min (ref >60)
Glucose, Bld: 63 mg/dL — ABNORMAL LOW (ref 65–99)
POTASSIUM: 5.6 mmol/L — AB (ref 3.5–5.1)
SODIUM: 131 mmol/L — AB (ref 135–145)
Total Bilirubin: 2.5 mg/dL — ABNORMAL HIGH (ref 0.3–1.2)
Total Protein: 8.9 g/dL — ABNORMAL HIGH (ref 6.5–8.1)

## 2016-03-21 LAB — CBC
HEMATOCRIT: 43.1 % (ref 39.0–52.0)
Hemoglobin: 14.7 g/dL (ref 13.0–17.0)
MCH: 33.2 pg (ref 26.0–34.0)
MCHC: 34.1 g/dL (ref 30.0–36.0)
MCV: 97.3 fL (ref 78.0–100.0)
Platelets: 160 10*3/uL (ref 150–400)
RBC: 4.43 MIL/uL (ref 4.22–5.81)
RDW: 14.6 % (ref 11.5–15.5)
WBC: 8.7 10*3/uL (ref 4.0–10.5)

## 2016-03-21 LAB — ETHANOL: Alcohol, Ethyl (B): <5 mg/dL (ref <5)

## 2016-03-21 LAB — POC OCCULT BLOOD, ED: FECAL OCCULT BLD: POSITIVE — AB

## 2016-03-21 MED ORDER — FAMOTIDINE IN NACL 20-0.9 MG/50ML-% IV SOLN
20.0000 mg | Freq: Once | INTRAVENOUS | Status: AC
  Administered 2016-03-21: 20 mg via INTRAVENOUS
  Filled 2016-03-21: qty 50

## 2016-03-21 MED ORDER — ONDANSETRON HCL 4 MG/2ML IJ SOLN
4.0000 mg | Freq: Once | INTRAMUSCULAR | Status: AC
  Administered 2016-03-21: 4 mg via INTRAVENOUS
  Filled 2016-03-21: qty 2

## 2016-03-21 MED ORDER — SODIUM CHLORIDE 0.9 % IV BOLUS (SEPSIS)
1000.0000 mL | Freq: Once | INTRAVENOUS | Status: AC
Start: 2016-03-21 — End: 2016-03-22
  Administered 2016-03-21: 1000 mL via INTRAVENOUS

## 2016-03-21 NOTE — ED Provider Notes (Signed)
[facility] DEPT Provider Note   CSN: [id number] Arrival date & time: 03/21/16  1825     History   Chief Complaint Chief Complaint  Patient presents with  . Emesis    HPI Bryan Fleming is a 55 y.o. male.  HPI  Bryan Fleming is a 55 y.o. male with hx of alcoholic hepatitis, HTN, COPD, who presents to the Emergency Department complaining of nausea since yesterday and vomiting that began today around noon.  He reports 4-5 episodes of vomiting and two bowel movements that appeared "dark." and blood on the tissue after wiping.  Symptoms have been intermittent and he has tolerated some food earlier today.  He reports a hx of GERD, and states that his medication was changed one month or so ago from nexium to zantac.  He denies fever, coffee ground emesis, abdominal pain, chest pain or shortness of breath.  He states that he still drinks occasionally, but "not as much as I used to."   Past Medical History:  Diagnosis Date  . COPD (chronic obstructive pulmonary disease) ([place]) 2007   EMPHYSEMA  . GERD (gastroesophageal reflux disease) 2010  . Hepatitis    Alcoholic   . Hypertension     Patient Active Problem List   Diagnosis Date Noted  . Elevated liver function tests 02/02/2016  . 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 Alcohol abuse 08/30/2015  . Anxiety state 08/30/2015  . Nicotine dependence, cigarettes, with other nicotine-induced disorders 08/30/2015  . Hepatitis 08/31/2011  . Hypertension   . GERD (gastroesophageal reflux disease)   . COPD (chronic obstructive pulmonary disease) ([place])     Past Surgical History:  Procedure Laterality Date  . CARDIAC CATHETERIZATION     4-5 YEARS AGO  . COLONOSCOPY  03/14/2012   Procedure: COLONOSCOPY;  Surgeon: Rogene Houston, MD;  Location: [facility];  Service: Endoscopy;  Laterality: N/A;  100  . WRIST GANGLION EXCISION  1985       Home Medications    Prior to Admission medications   Medication Sig Start Date End  Date Taking? Authorizing Provider  albuterol (PROVENTIL HFA;VENTOLIN HFA) 108 (90 Base) MCG/ACT inhaler Inhale 2 puffs into the lungs every 6 (six) hours as needed for wheezing or shortness of breath. 11/02/15   Soyla Dryer, PA-C  aspirin 81 MG tablet Take 81 mg by mouth daily.    Historical Provider, MD  citalopram (CELEXA) 20 MG tablet Take 20 mg by mouth daily.    Historical Provider, MD  Cyanocobalamin 2500 MCG TABS Take 1 tablet by mouth daily.    Historical Provider, MD  metoprolol (LOPRESSOR) 100 MG tablet Take 1 tablet (100 mg total) by mouth 2 (two) times daily. 01/25/16   Soyla Dryer, PA-C  ranitidine (ZANTAC) 300 MG tablet TAKE ONE TABLET BY MOUTH AT BEDTIME 02/16/16   Soyla Dryer, PA-C  traZODone (DESYREL) 100 MG tablet Take 100 mg by mouth at bedtime.    Historical Provider, MD    Family History Family History  Problem Relation Age of Onset  . Hypertension Mother   . Asthma Father   . Diabetes Sister   . Hypertension Sister   . Hypertension Brother   . Hypertension Sister   . Lupus Daughter     Social History Social History  Substance Use Topics  . Smoking status: Current Every Day Smoker    Packs/day: 1.00    Years: 37.00    Types: Cigarettes  . Smokeless tobacco: Never Used  . Alcohol use 13.2  oz/week    21 Cans of beer, 1 Standard drinks or equivalent per week     Comment: dry since beginning march 2017.  alcoholic     Allergies   Review of patient's allergies indicates no known allergies.   Review of Systems Review of Systems  Constitutional: Positive for appetite change. Negative for chills and fever.  HENT: Negative for trouble swallowing.   Respiratory: Negative for chest tightness and shortness of breath.   Cardiovascular: Negative for chest pain.  Gastrointestinal: Positive for blood in stool, nausea and vomiting. Negative for abdominal pain.  Genitourinary: Negative for decreased urine volume, difficulty urinating, dysuria and flank pain.   Musculoskeletal: Negative for back pain.  Skin: Negative for color change and rash.  Neurological: Negative for dizziness, weakness and numbness.  Hematological: Negative for adenopathy.  All other systems reviewed and are negative.    Physical Exam Updated Vital Signs BP (!) 158/102 (BP Location: Left Arm)   Pulse 86   Temp 98.4 F (36.9 C) (Oral)   Resp 20   Ht '5\' 9"'$  (1.753 m)   Wt 67.6 kg   SpO2 98%   BMI 22.00 kg/m   Physical Exam  Constitutional: He is oriented to person, place, and time. He appears well-developed and well-nourished. No distress.  Anxious appearing  HENT:  Mouth/Throat: Oropharynx is clear and moist.  Eyes: EOM are normal. Pupils are equal, round, and reactive to light.  Neck: Normal range of motion. No JVD present.  Cardiovascular: Normal rate and normal heart sounds.   No murmur heard. Pulmonary/Chest: Effort normal. No respiratory distress. He exhibits no tenderness.  Abdominal: Soft. He exhibits no distension and no mass. There is no tenderness. There is no guarding.  Genitourinary: Rectal exam shows guaiac positive stool. Rectal exam shows no external hemorrhoid, no mass and no tenderness.  Musculoskeletal: Normal range of motion. He exhibits no edema.  Neurological: He is alert and oriented to person, place, and time.  Skin: Skin is warm.  Psychiatric: He has a normal mood and affect.  Nursing note and vitals reviewed.    ED Treatments / Results  Labs (all labs ordered are listed, but only abnormal results are displayed) Labs Reviewed  COMPREHENSIVE METABOLIC PANEL - Abnormal; Notable for the following:       Result Value   Sodium 131 (*)    Potassium 5.6 (*)    Chloride 92 (*)    CO2 17 (*)    Glucose, Bld 63 (*)    Total Protein 8.9 (*)    AST 96 (*)    Total Bilirubin 2.5 (*)    Anion gap 22 (*)    All other components within normal limits  CBC  ETHANOL  POC OCCULT BLOOD, ED  TYPE AND SCREEN    EKG  EKG  Interpretation  Date/Time:  Tuesday March 22 2016 01:09:32 EDT Ventricular Rate:  76 PR Interval:    QRS Duration: 82 QT Interval:  415 QTC Calculation: 467 R Axis:   72 Text Interpretation:  Sinus rhythm Atrial premature complex Baseline wander in lead(s) I III aVR aVL Confirmed by HORTON  MD, COURTNEY ([id number]) on 03/22/2016 1:19:37 AM       Radiology No results found.  Procedures Procedures (including critical care time)  Medications Ordered in ED Medications  sodium chloride 0.9 % bolus 1,000 mL (0 mLs Intravenous Stopped 03/22/16 0058)  ondansetron (ZOFRAN) injection 4 mg (4 mg Intravenous Given 03/21/16 2326)  famotidine (PEPCID) IVPB 20 mg premix (  0 mg Intravenous Stopped 03/22/16 0058)  sodium chloride 0.9 % bolus 1,000 mL (1,000 mLs Intravenous New Bag/Given 03/22/16 0105)     Initial Impression / Assessment and Plan / ED Course  I have reviewed the triage vital signs and the nursing notes.  Pertinent labs & imaging results that were available during my care of the patient were reviewed by me and considered in my medical decision making (see chart for details).  Clinical Course    Pt is well appearing.  Non-toxic.  abd is soft, NT.     0040  Pt is feeling better and requesting to go home.  Has tolerated po fluids and ambulated to the restroom w/o difficulty.  Abd remains soft and NT.    Labs reveal hyperkalemia and anion gap of 22.  Possibly erroneous result, but will administer second liter fluids and recheck BMP.    On recheck, labs improved potassium now 4.2 and anion gap of 14.  Pt stable for d/c.  He [facility] appt with [facility] on [facility].  Return precautions discussed and advised to follow-up with his PCP or GI doctor.    Final Clinical Impressions(s) / ED Diagnoses   Final diagnoses:  Non-intractable vomiting with nausea, vomiting of unspecified type    New Prescriptions New Prescriptions   No medications on file     Kem Parkinson,  PA-C 03/22/16 [place], MD 03/22/16 [phone]

## 2016-03-21 NOTE — ED Triage Notes (Signed)
Pt c/o emesis all day, intermittent dark stools and nose bleeds.

## 2016-03-22 LAB — BASIC METABOLIC PANEL
ANION GAP: 14 (ref 5–15)
BUN: 13 mg/dL (ref 6–20)
CHLORIDE: 100 mmol/L — AB (ref 101–111)
CO2: 17 mmol/L — AB (ref 22–32)
Calcium: 7.9 mg/dL — ABNORMAL LOW (ref 8.9–10.3)
Creatinine, Ser: 0.92 mg/dL (ref 0.61–1.24)
GFR calc Af Amer: >60 mL/min (ref >60)
GFR calc non Af Amer: >60 mL/min (ref >60)
GLUCOSE: 86 mg/dL (ref 65–99)
POTASSIUM: 4.2 mmol/L (ref 3.5–5.1)
SODIUM: 131 mmol/L — AB (ref 135–145)

## 2016-03-22 LAB — TYPE AND SCREEN
ABO/RH(D): O POS
Antibody Screen: NEGATIVE

## 2016-03-22 MED ORDER — ONDANSETRON HCL 4 MG PO TABS: 4.0000 mg | ORAL_TABLET | Freq: Four times a day (QID) | ORAL | 0 refills | Status: DC

## 2016-03-22 MED ORDER — SODIUM CHLORIDE 0.9 % IV BOLUS (SEPSIS)
1000.0000 mL | Freq: Once | INTRAVENOUS | Status: AC
  Administered 2016-03-22: 1000 mL via INTRAVENOUS

## 2016-03-22 NOTE — Discharge Instructions (Signed)
Keep your appt with your primary provider today.  Clear fluids.  Return to ER for any worsening symptoms

## 2016-05-09 ENCOUNTER — Ambulatory Visit: Payer: Self-pay

## 2016-05-23 ENCOUNTER — Other Ambulatory Visit: Payer: Self-pay | Admitting: Physician Assistant

## 2016-05-25 ENCOUNTER — Other Ambulatory Visit: Payer: Self-pay | Admitting: Student

## 2016-05-25 DIAGNOSIS — R945 Abnormal results of liver function studies: Principal | ICD-10-CM

## 2016-05-25 DIAGNOSIS — R7989 Other specified abnormal findings of blood chemistry: Secondary | ICD-10-CM

## 2016-06-02 ENCOUNTER — Other Ambulatory Visit: Payer: Self-pay | Admitting: Physician Assistant

## 2016-06-02 ENCOUNTER — Ambulatory Visit: Payer: Self-pay | Admitting: Physician Assistant

## 2016-06-02 ENCOUNTER — Encounter: Payer: Self-pay | Admitting: Physician Assistant

## 2016-06-02 VITALS — BP 166/80 | HR 105 | Ht 68.0 in | Wt 152.6 lb

## 2016-06-02 DIAGNOSIS — R000 Tachycardia, unspecified: Secondary | ICD-10-CM

## 2016-06-02 DIAGNOSIS — F1010 Alcohol abuse, uncomplicated: Secondary | ICD-10-CM

## 2016-06-02 DIAGNOSIS — F411 Generalized anxiety disorder: Secondary | ICD-10-CM

## 2016-06-02 DIAGNOSIS — I10 Essential (primary) hypertension: Secondary | ICD-10-CM

## 2016-06-02 DIAGNOSIS — J449 Chronic obstructive pulmonary disease, unspecified: Secondary | ICD-10-CM

## 2016-06-02 DIAGNOSIS — F17218 Nicotine dependence, cigarettes, with other nicotine-induced disorders: Secondary | ICD-10-CM

## 2016-06-02 NOTE — Progress Notes (Signed)
BP (!) 168/82   Pulse (!) 115   Ht '5\' 8"'$  (1.727 m)   Wt 152 lb 9.6 oz (69.2 kg)   SpO2 95%   BMI 23.20 kg/m    Subjective:    Patient ID: Bryan Fleming, male    DOB: 1961/06/27, 55 y.o.   MRN: [id number]  HPI: Bryan Fleming is a 55 y.o. male presenting on 06/02/2016 for COPD   HPI  Pt has not yet taken his medication today.    Pt is still going to [facility] for [place] issues.  He is nervous feeling today.  He says that 08-03-23 is coming up and he has been thinking a lot more lately about the death of his wife (who died last 08/03/2023).   Pt is still smoking  Pt says that he is drinking a lot again.    Relevant past medical, surgical, family and social history reviewed and updated as indicated. Interim medical history since our last visit reviewed. Allergies and medications reviewed and updated.   Current Outpatient Prescriptions:  .  albuterol (PROVENTIL HFA;VENTOLIN HFA) 108 (90 Base) MCG/ACT inhaler, Inhale 2 puffs into the lungs every 6 (six) hours as needed for wheezing or shortness of breath., Disp: 3 Inhaler, Rfl: 1 .  aspirin 81 MG tablet, Take 81 mg by mouth daily., Disp: , Rfl:  .  citalopram (CELEXA) 20 MG tablet, Take 20 mg by mouth daily., Disp: , Rfl:  .  Cyanocobalamin 2500 MCG TABS, Take 500 mcg by mouth daily. , Disp: , Rfl:  .  diphenhydrAMINE (BENADRYL) 25 MG tablet, Take 25 mg by mouth daily as needed for allergies., Disp: , Rfl:  .  metoprolol (LOPRESSOR) 100 MG tablet, Take 1 tablet (100 mg total) by mouth 2 (two) times daily., Disp: 60 tablet, Rfl: 0 .  metoprolol (LOPRESSOR) 100 MG tablet, TAKE ONE TABLET BY MOUTH TWICE DAILY, Disp: 60 tablet, Rfl: 1 .  ranitidine (ZANTAC) 300 MG tablet, TAKE ONE TABLET BY MOUTH AT BEDTIME, Disp: 30 tablet, Rfl: 6 .  traZODone (DESYREL) 100 MG tablet, Take 100 mg by mouth at bedtime., Disp: , Rfl:    Review of Systems  Constitutional: Negative for appetite change, chills, diaphoresis, fatigue, fever and  unexpected weight change.  HENT: Negative for congestion, dental problem, drooling, ear pain, facial swelling, hearing loss, mouth sores, sneezing, sore throat, trouble swallowing and voice change.   Eyes: Negative for pain, discharge, redness, itching and visual disturbance.  Respiratory: Positive for shortness of breath (with exertion) and wheezing. Negative for cough and choking.   Cardiovascular: Negative for chest pain, palpitations and leg swelling.  Gastrointestinal: Negative for abdominal pain, blood in stool, constipation, diarrhea and vomiting.  Endocrine: Negative for cold intolerance, heat intolerance and polydipsia.  Genitourinary: Negative for decreased urine volume, dysuria and hematuria.  Musculoskeletal: Negative for arthralgias, back pain and gait problem.  Skin: Negative for rash.  Allergic/Immunologic: Negative for environmental allergies.  Neurological: Negative for seizures, syncope, light-headedness and headaches.  Hematological: Negative for adenopathy.  Psychiatric/Behavioral: Negative for agitation, dysphoric mood and suicidal ideas. The patient is not nervous/anxious.     Per HPI unless specifically indicated above     Objective:    BP (!) 168/82   Pulse (!) 115   Ht '5\' 8"'$  (1.727 m)   Wt 152 lb 9.6 oz (69.2 kg)   SpO2 95%   BMI 23.20 kg/m   Wt Readings from Last 3 Encounters:  06/02/16 152 lb 9.6 oz (69.2 kg)  03/21/16 149 lb (67.6 kg)  02/02/16 151 lb 12.8 oz (68.9 kg)    Physical Exam  Constitutional: He is oriented to person, place, and time. He appears well-developed and well-nourished.  HENT:  Head: Normocephalic and atraumatic.  Neck: Neck supple.  Cardiovascular: Normal rate and regular rhythm.   Pulmonary/Chest: Effort normal and breath sounds normal. He has no wheezes.  Abdominal: Soft. Bowel sounds are normal. There is no hepatosplenomegaly. There is no tenderness.  Musculoskeletal: He exhibits no edema.  Lymphadenopathy:    He has no  cervical adenopathy.  Neurological: He is alert and oriented to person, place, and time.  Skin: Skin is warm and dry.  Psychiatric: His behavior is normal. His mood appears anxious.  Vitals reviewed.   EKG st at 105. No changes comp with ekg from jan 2017    Assessment & Plan:   Encounter Diagnoses  Name Primary?  . Hypertension, unspecified type Yes  . Tachycardia   . Alcohol abuse   . Anxiety state   . Chronic obstructive pulmonary disease, unspecified COPD type ([place])   . Nicotine dependence, cigarettes, with other nicotine-induced disorders     -pt is counseled to take his meds every day as directed -iFOBT given since pt had + guiac in ER in august -cmp today -pt will continue with counseling at [facility]  He says he would be willing to go to [place] meeting locally.  He is given list of meetings.  He says that is all he is wanting to do for now.  He is given contact information for [facility] who can help direct him to additional resources as he is willing to utilize them.  Pt instructed to go to ER if needed -follow up here in 2 wk.  RTO sooner prn.  (The duration of this appointment visit was 30 minutes of face-to-face time with the patient.  Greater than 50% of this time was spent in counseling, explanation of diagnosis, planning of further management, and coordination of care.)

## 2016-06-13 LAB — IFOBT (OCCULT BLOOD): IFOBT: NEGATIVE

## 2016-06-15 ENCOUNTER — Ambulatory Visit: Payer: Self-pay | Admitting: Physician Assistant

## 2016-06-16 ENCOUNTER — Encounter (HOSPITAL_COMMUNITY): Payer: Self-pay

## 2016-06-16 ENCOUNTER — Emergency Department (HOSPITAL_COMMUNITY): Payer: Self-pay

## 2016-06-16 ENCOUNTER — Emergency Department (HOSPITAL_COMMUNITY)
Admission: EM | Admit: 2016-06-16 | Discharge: 2016-06-16 | Disposition: A | Discharge status: Home / Self Care | Payer: Self-pay | Attending: Emergency Medicine | Admitting: Emergency Medicine

## 2016-06-16 DIAGNOSIS — Z7982 Long term (current) use of aspirin: Secondary | ICD-10-CM | POA: Insufficient documentation

## 2016-06-16 DIAGNOSIS — Z79899 Other long term (current) drug therapy: Secondary | ICD-10-CM | POA: Insufficient documentation

## 2016-06-16 DIAGNOSIS — K852 Alcohol induced acute pancreatitis without necrosis or infection: Secondary | ICD-10-CM | POA: Insufficient documentation

## 2016-06-16 DIAGNOSIS — J449 Chronic obstructive pulmonary disease, unspecified: Secondary | ICD-10-CM | POA: Insufficient documentation

## 2016-06-16 DIAGNOSIS — F1010 Alcohol abuse, uncomplicated: Secondary | ICD-10-CM | POA: Insufficient documentation

## 2016-06-16 DIAGNOSIS — F17210 Nicotine dependence, cigarettes, uncomplicated: Secondary | ICD-10-CM | POA: Insufficient documentation

## 2016-06-16 LAB — COMPREHENSIVE METABOLIC PANEL
ALT: 43 U/L (ref 17–63)
AST: 99 U/L — AB (ref 15–41)
Albumin: 4.9 g/dL (ref 3.5–5.0)
Alkaline Phosphatase: 74 U/L (ref 38–126)
Anion gap: 17 — ABNORMAL HIGH (ref 5–15)
BILIRUBIN TOTAL: 1.5 mg/dL — AB (ref 0.3–1.2)
BUN: 8 mg/dL (ref 6–20)
CO2: 26 mmol/L (ref 22–32)
Calcium: 9.8 mg/dL (ref 8.9–10.3)
Chloride: 90 mmol/L — ABNORMAL LOW (ref 101–111)
Creatinine, Ser: 0.79 mg/dL (ref 0.61–1.24)
GFR calc Af Amer: >60 mL/min (ref >60)
Glucose, Bld: 172 mg/dL — ABNORMAL HIGH (ref 65–99)
POTASSIUM: 3.7 mmol/L (ref 3.5–5.1)
Sodium: 133 mmol/L — ABNORMAL LOW (ref 135–145)
TOTAL PROTEIN: 9.5 g/dL — AB (ref 6.5–8.1)

## 2016-06-16 LAB — CBC WITH DIFFERENTIAL/PLATELET
Basophils Absolute: 0 10*3/uL (ref 0.0–0.1)
Basophils Relative: 0 %
Eosinophils Absolute: 0 10*3/uL (ref 0.0–0.7)
Eosinophils Relative: 0 %
HCT: 47 % (ref 39.0–52.0)
Hemoglobin: 16.6 g/dL (ref 13.0–17.0)
LYMPHS ABS: 1.2 10*3/uL (ref 0.7–4.0)
LYMPHS PCT: 15 %
MCH: 34.6 pg — AB (ref 26.0–34.0)
MCHC: 35.3 g/dL (ref 30.0–36.0)
MCV: 97.9 fL (ref 78.0–100.0)
MONO ABS: 1 10*3/uL (ref 0.1–1.0)
MONOS PCT: 12 %
NEUTROS ABS: 6.1 10*3/uL (ref 1.7–7.7)
Neutrophils Relative %: 73 %
Platelets: 215 10*3/uL (ref 150–400)
RBC: 4.8 MIL/uL (ref 4.22–5.81)
RDW: 13.4 % (ref 11.5–15.5)
WBC: 8.3 10*3/uL (ref 4.0–10.5)

## 2016-06-16 LAB — LIPASE, BLOOD: LIPASE: 1498 U/L — AB (ref 11–51)

## 2016-06-16 LAB — TROPONIN I: Troponin I: <0.03 ng/mL (ref <0.03)

## 2016-06-16 LAB — ETHANOL: Alcohol, Ethyl (B): <5 mg/dL (ref <5)

## 2016-06-16 IMAGING — CT CT ABD-PELV W/ CM
2 of 5 series · 16 of 46 positions shown, 18 images · IV contrast (APPLIED)
Comparison: [DATE]

CLINICAL DATA: Abdominal pain, vomiting and diarrhea over the last
day.

EXAM:
CT ABDOMEN AND PELVIS WITH CONTRAST
TECHNIQUE: Multidetector CT imaging of the abdomen and pelvis was performed
using the standard protocol following bolus administration of
intravenous contrast.
CONTRAST:  30mL [56] IOPAMIDOL ([56]) INJECTION 61%,
100mL [56] IOPAMIDOL ([56]) INJECTION 61%

[Series 2: axial st · axial · 0.72mm/px · z∈[-732,-347]mm · 13 of 89 slices shown, 15 images]
[im 6/89  soft-tissue]
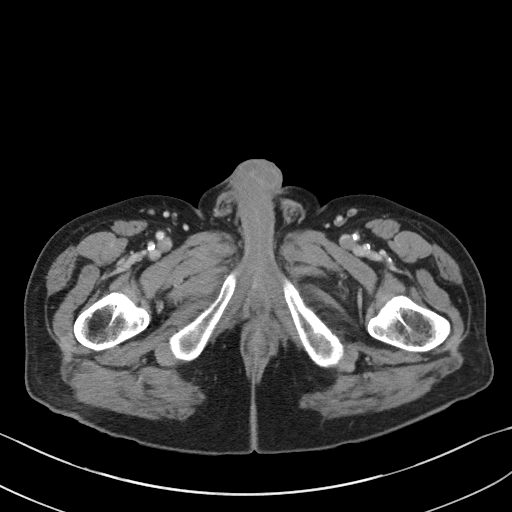
[im 6/89  bone]
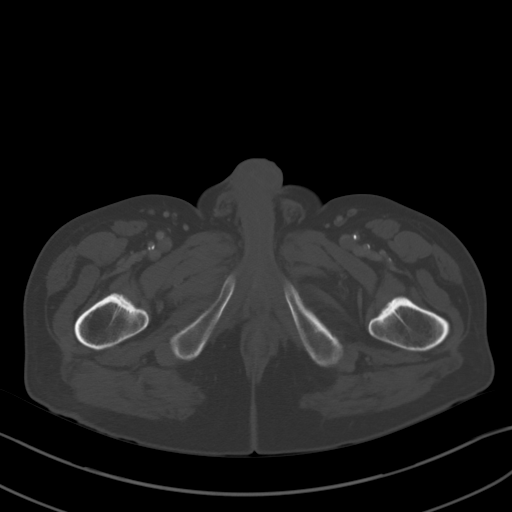
[im 12/89  soft-tissue]
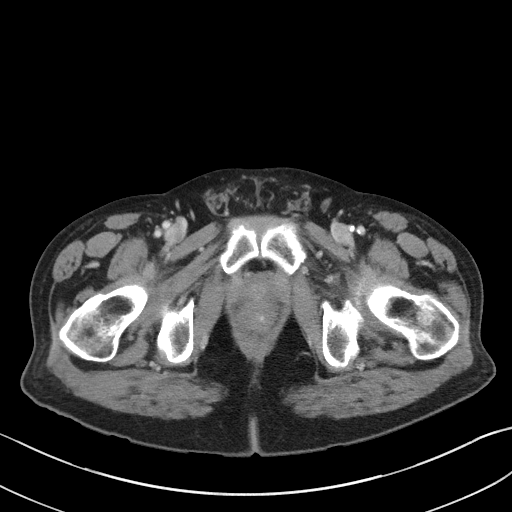
[im 17/89  soft-tissue]
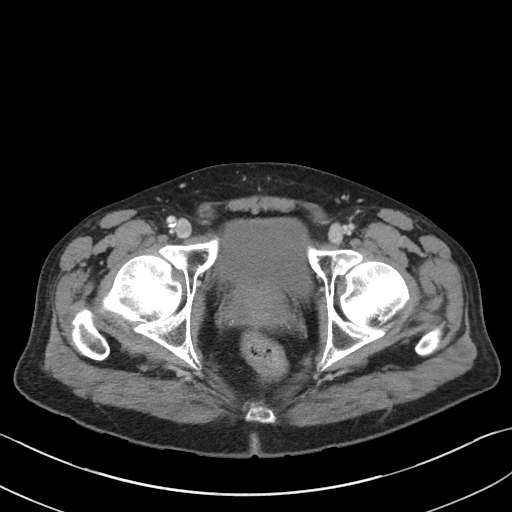
[im 28/89  soft-tissue]
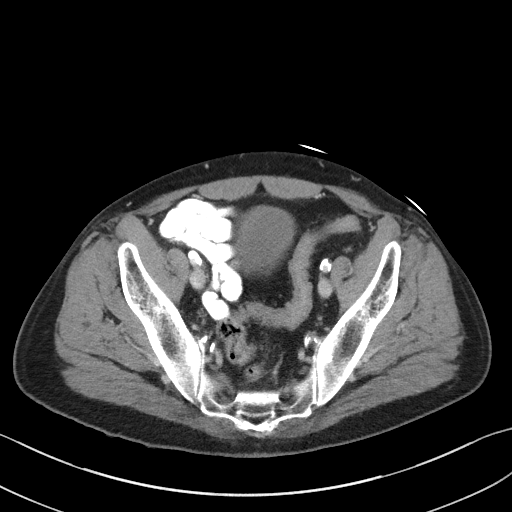
[im 34/89  soft-tissue]
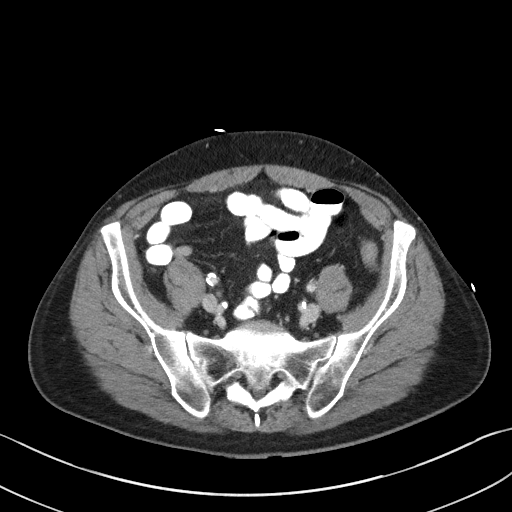
[im 39/89  soft-tissue]
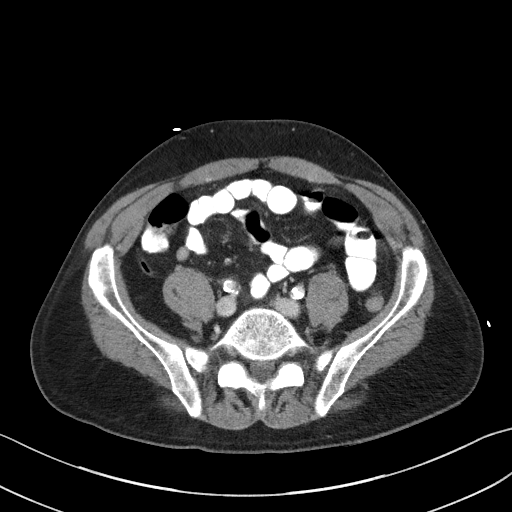
[im 45/89  soft-tissue]
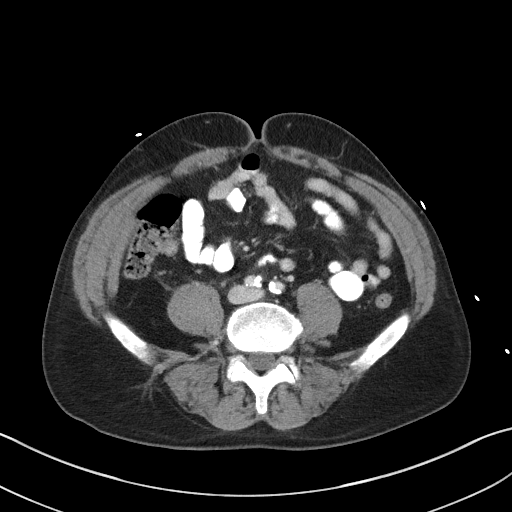
[im 50/89  soft-tissue]
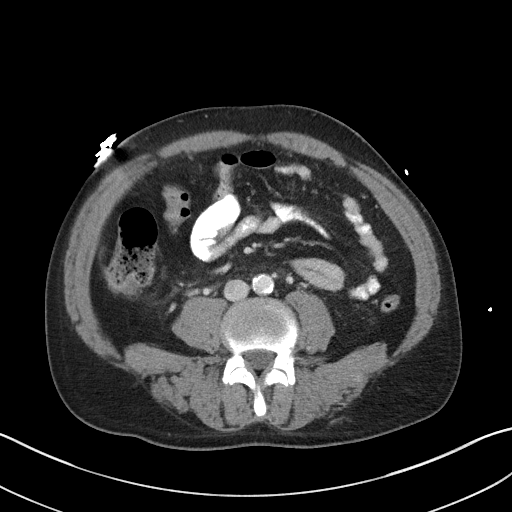
[im 56/89  soft-tissue]
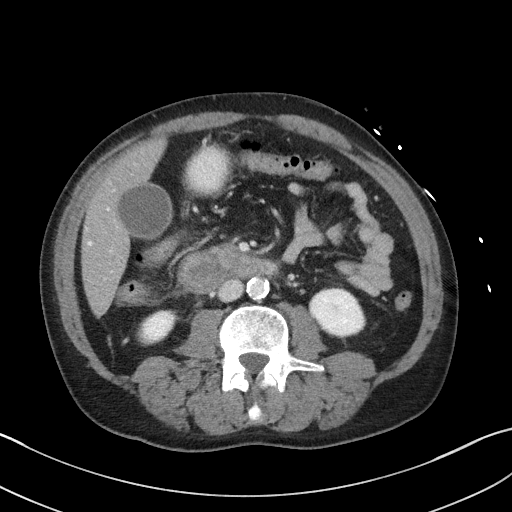
[im 56/89  bone]
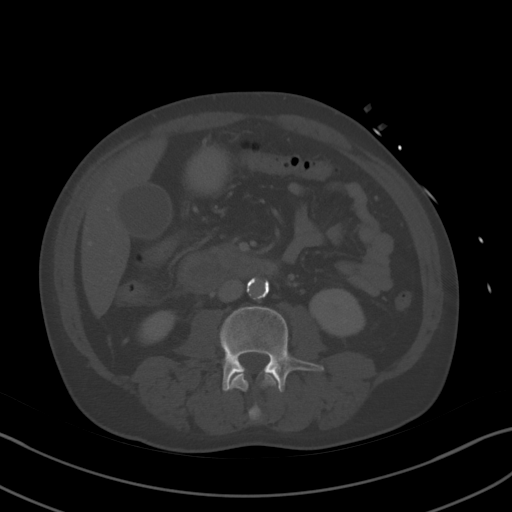
[im 61/89  soft-tissue]
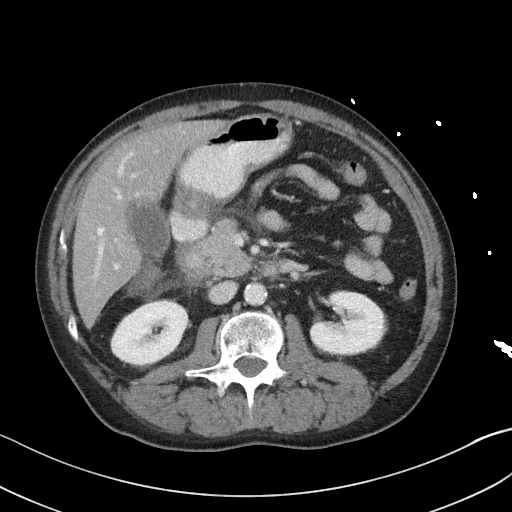
[im 72/89  soft-tissue]
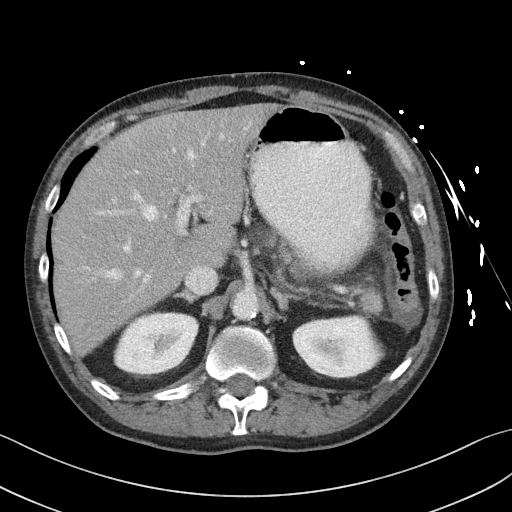
[im 78/89  soft-tissue]
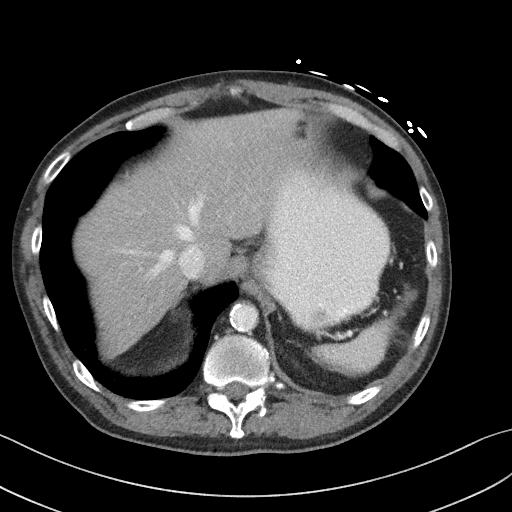
[im 83/89  soft-tissue]
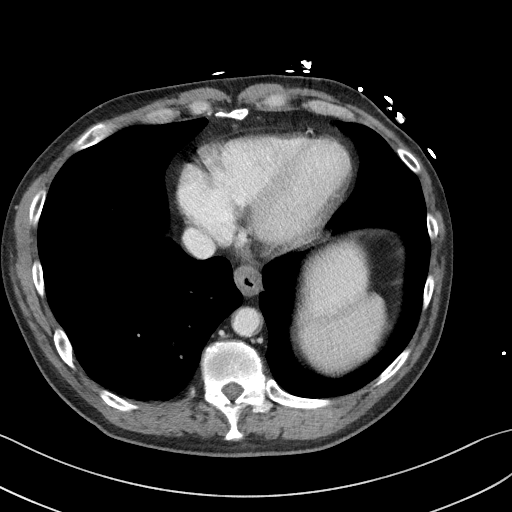

[Series 5: coronal st · coronal · 0.74mm/px · 3 of 100 slices shown]
[im 34/100  soft-tissue]
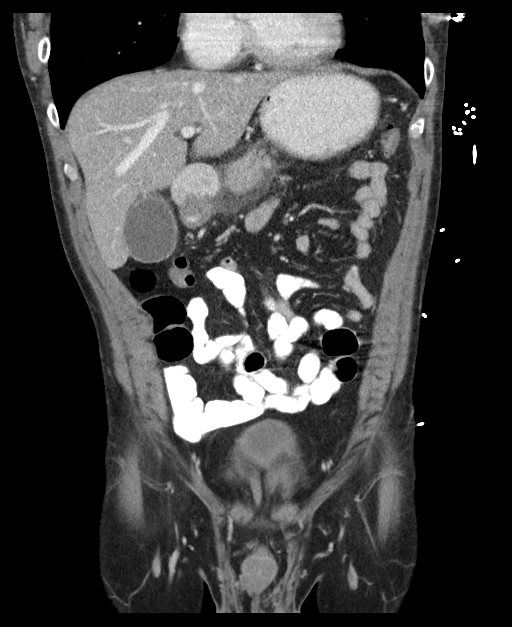
[im 45/100  soft-tissue]
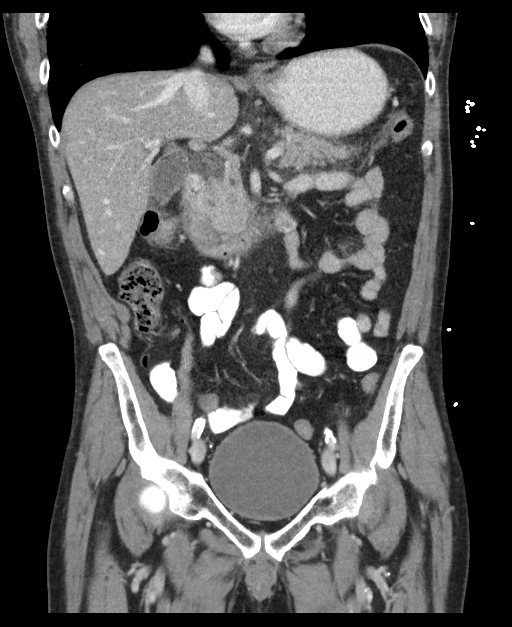
[im 56/100  soft-tissue]
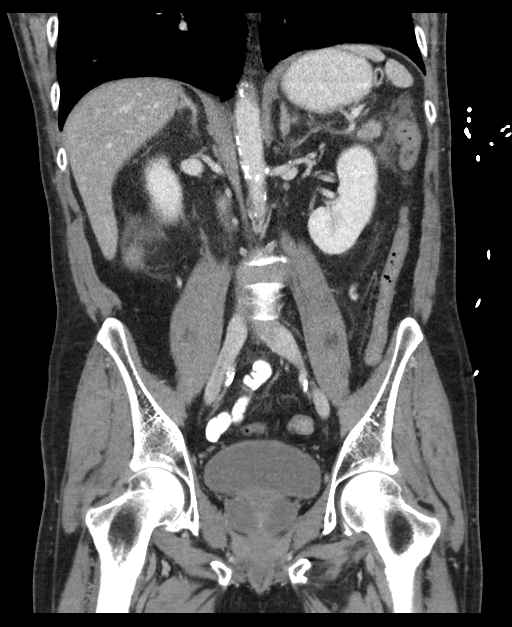

[16 of 46 positions shown; findings below may reference images not displayed]

FINDINGS: Lower chest: Lungs clear.  No pleural or pericardial fluid.

Hepatobiliary: Fatty change of the liver. No focal liver lesion. No
calcified gallstones.

Pancreas: Peripancreatic and retroperitoneal edema most likely
secondary to acute pancreatitis. No evidence of abscess or
infarction.

Spleen: Normal

Adrenals/Urinary Tract: Adrenal glands are normal. Kidneys are
normal.

Stomach/Bowel: Possible peptic inflammation of the antrum and
duodenum. This could be secondary to the pancreatitis. Normal
appearing appendix. No diverticulitis.

Vascular/Lymphatic: Aortic atherosclerosis. No aneurysm. IVC is
normal. No retroperitoneal mass or lymphadenopathy.

Reproductive: Normal

Other: No free air.  No hernia.

Musculoskeletal: Normal
IMPRESSION: Acute pancreatitis. Regional retroperitoneal edema. No evidence of
abscess or necrosis.

Mild edema of the antrum and duodenum. This could be secondary to
the pancreatitis or there could be some acid peptic inflammation.

Fatty change of the liver.

Aortic atherosclerosis.

## 2016-06-16 IMAGING — DX DG ABDOMEN ACUTE W/ 1V CHEST
3 series · 3 of 3 positions shown · non-contrast
Comparison: [DATE]

CLINICAL DATA: Epigastric pain, nausea, vomiting, and diarrhea
since yesterday. History of hypertension, GE reflux disease,
hepatitis, COPD, smoker.

EXAM:
DG ABDOMEN ACUTE W/ 1V CHEST

[chest pa]
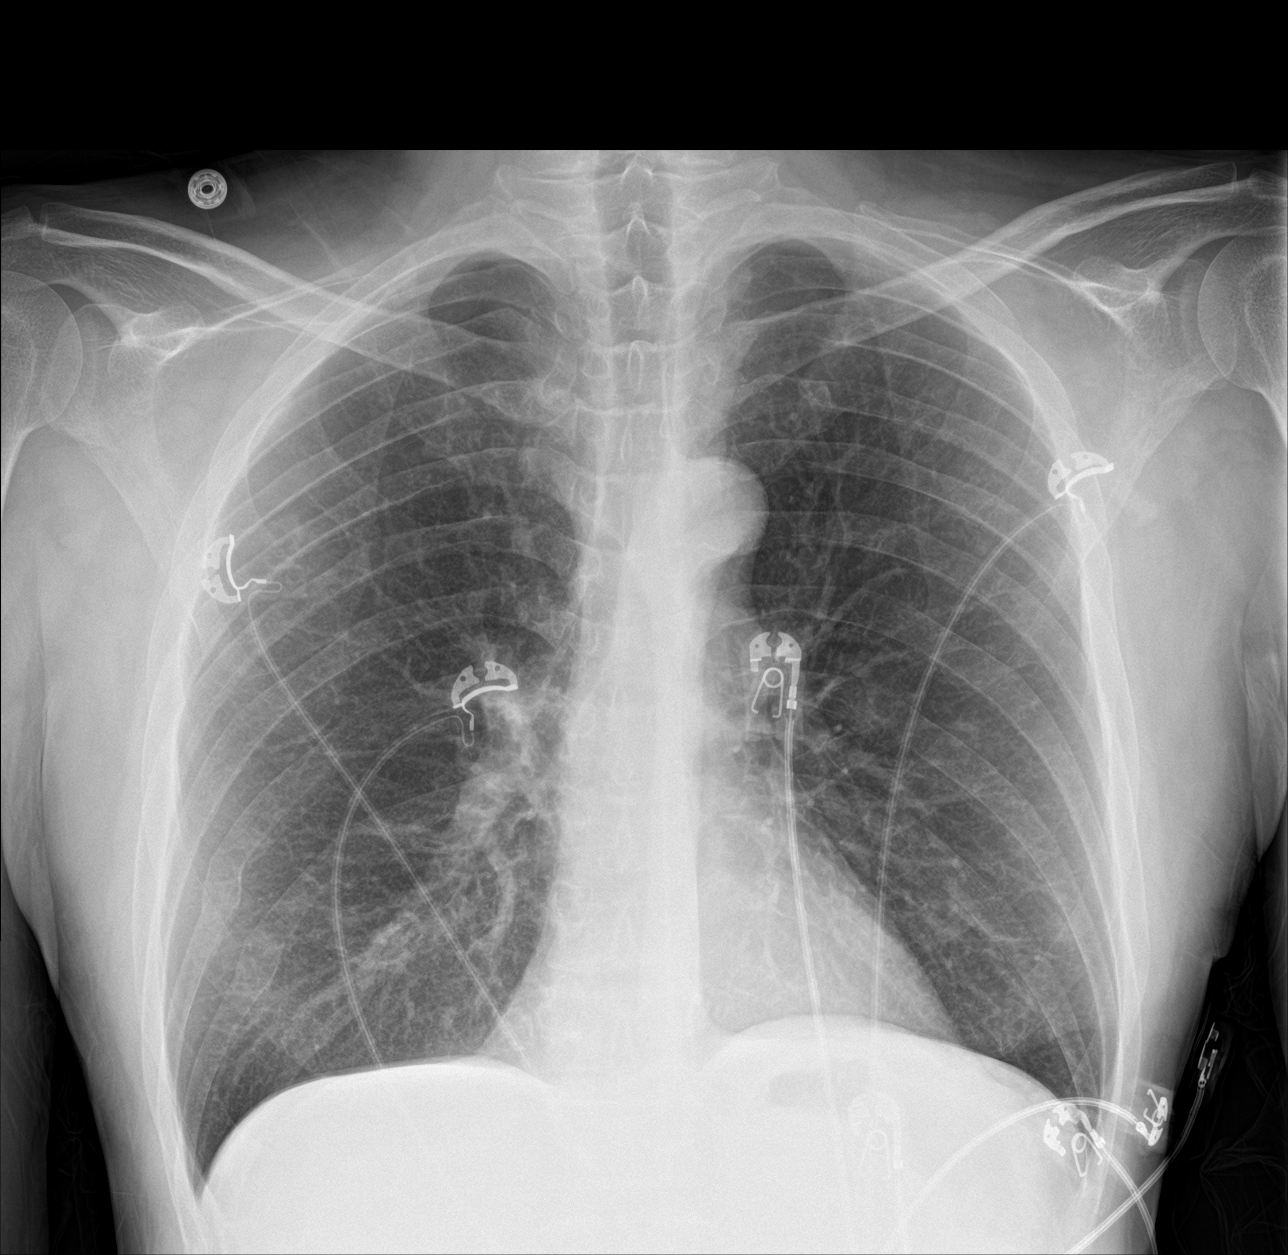

[abdomen erect]
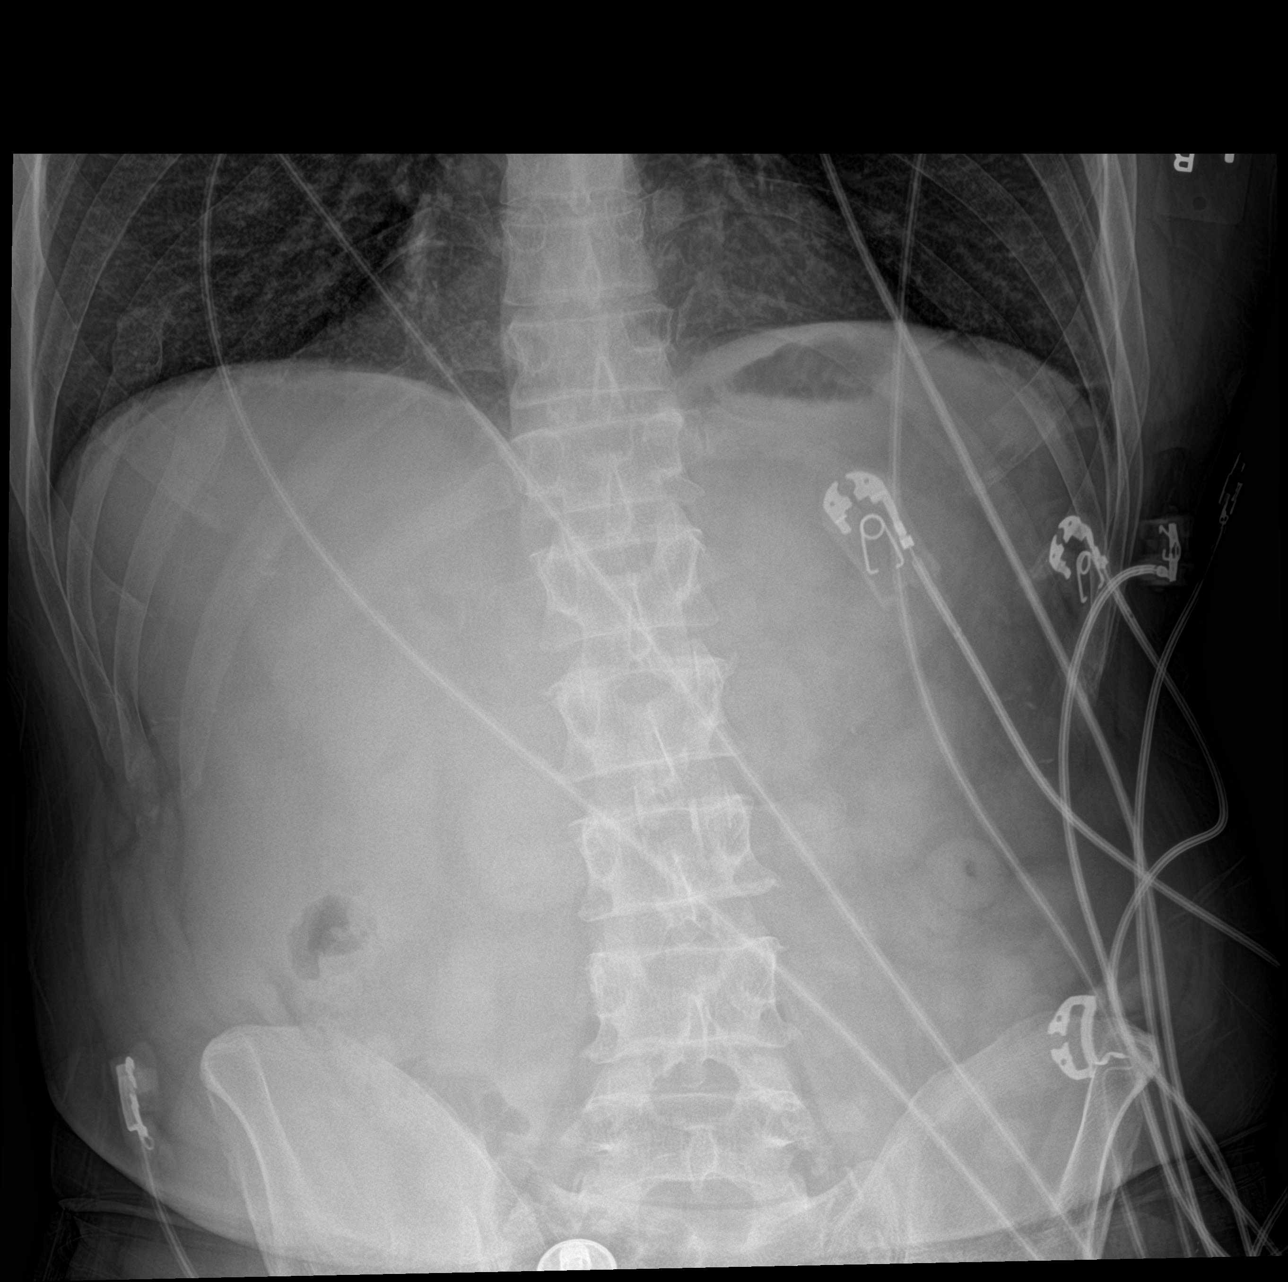

[abdomen supine]
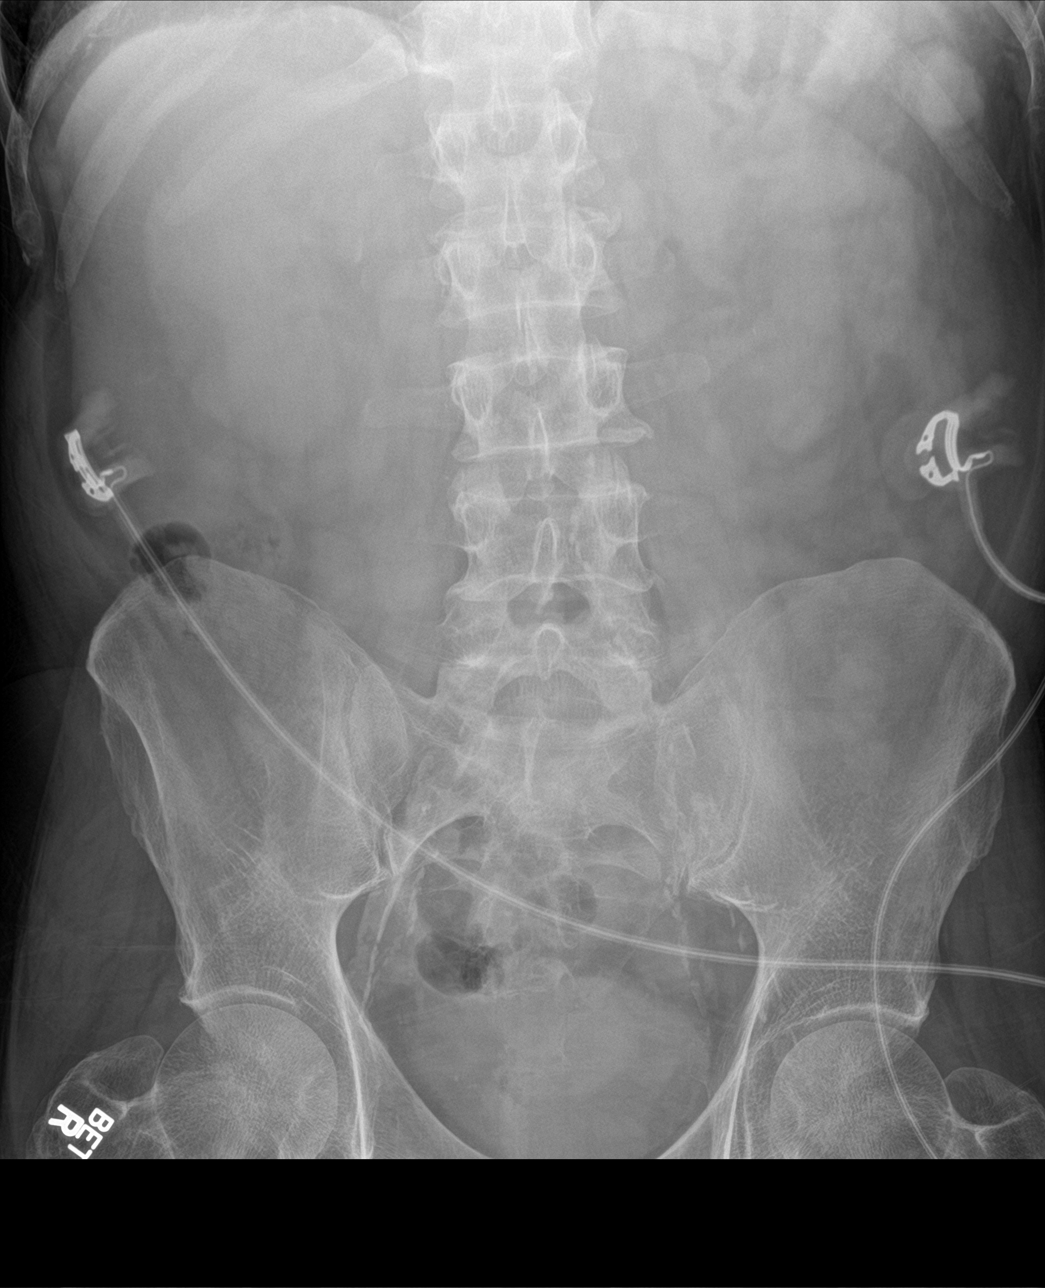

[3 of 3 positions shown; findings below may reference images not displayed]

FINDINGS: Mild hyperinflation. Normal heart size and pulmonary vascularity. No
focal airspace disease or consolidation in the lungs. No blunting of
costophrenic angles. No pneumothorax. Mediastinal contours appear
intact. Old right rib fractures.

Minimal scattered gas and stool in the colon. No small or large
bowel distention. No free intra-abdominal air. No abnormal air-fluid
levels. No radiopaque stones. Vascular calcifications. Degenerative
changes in the spine.
IMPRESSION: No evidence of active pulmonary disease. Normal nonobstructive bowel
gas pattern.

## 2016-06-16 MED ORDER — ONDANSETRON 4 MG PO TBDP: 4.0000 mg | ORAL_TABLET | Freq: Three times a day (TID) | ORAL | 0 refills | Status: DC | PRN

## 2016-06-16 MED ORDER — IOPAMIDOL (ISOVUE-300) INJECTION 61%
INTRAVENOUS | Status: AC
  Administered 2016-06-16: 30 mL
  Filled 2016-06-16: qty 30

## 2016-06-16 MED ORDER — IOPAMIDOL (ISOVUE-300) INJECTION 61%
100.0000 mL | Freq: Once | INTRAVENOUS | Status: AC | PRN
  Administered 2016-06-16: 100 mL via INTRAVENOUS

## 2016-06-16 MED ORDER — SODIUM CHLORIDE 0.9 % IV SOLN
INTRAVENOUS | Status: DC
  Administered 2016-06-16: 05:00:00 via INTRAVENOUS

## 2016-06-16 MED ORDER — ONDANSETRON HCL 4 MG/2ML IJ SOLN
4.0000 mg | Freq: Once | INTRAMUSCULAR | Status: AC
  Administered 2016-06-16: 4 mg via INTRAVENOUS
  Filled 2016-06-16: qty 2

## 2016-06-16 MED ORDER — PANTOPRAZOLE SODIUM 40 MG PO TBEC
40.0000 mg | DELAYED_RELEASE_TABLET | Freq: Once | ORAL | Status: AC
  Administered 2016-06-16: 40 mg via ORAL
  Filled 2016-06-16: qty 1

## 2016-06-16 MED ORDER — SODIUM CHLORIDE 0.9 % IV BOLUS (SEPSIS)
1000.0000 mL | Freq: Once | INTRAVENOUS | Status: AC
  Administered 2016-06-16: 1000 mL via INTRAVENOUS

## 2016-06-16 MED ORDER — GI COCKTAIL ~~LOC~~
30.0000 mL | Freq: Once | ORAL | Status: AC
  Administered 2016-06-16: 30 mL via ORAL
  Filled 2016-06-16: qty 30

## 2016-06-16 MED ORDER — MORPHINE SULFATE (PF) 4 MG/ML IV SOLN
4.0000 mg | Freq: Once | INTRAVENOUS | Status: AC
  Administered 2016-06-16: 4 mg via INTRAVENOUS
  Filled 2016-06-16: qty 1

## 2016-06-16 MED ORDER — HYDROCODONE-ACETAMINOPHEN 5-325 MG PO TABS: 1.0000 | ORAL_TABLET | Freq: Four times a day (QID) | ORAL | 0 refills | Status: DC | PRN

## 2016-06-16 NOTE — ED Provider Notes (Signed)
TIME SEEN: 2:40 AM  CHIEF COMPLAINT: Abdominal pain, vomiting  HPI: Pt is a 55 y.o. male with history of hypertension, alcoholic hepatitis, COPD, GERD who presents to the emergency department with upper abdominal pain that he describes as feeling like his prior acid reflux. Has had nausea and vomiting. This started tonight. No fevers, chills, chest pain or shortness of breath, diarrhea, bloody stools or melena. Has never had abdominal surgery. Denies heavy NSAID use. Is still drinking alcohol.  ROS: See HPI Constitutional: no fever  Eyes: no drainage  ENT: no runny nose   Cardiovascular:  no chest pain  Resp: no SOB  GI:  vomiting GU: no dysuria Integumentary: no rash  Allergy: no hives  Musculoskeletal: no leg swelling  Neurological: no slurred speech ROS otherwise negative  PAST MEDICAL HISTORY/PAST SURGICAL HISTORY:  Past Medical History:  Diagnosis Date  . COPD (chronic obstructive pulmonary disease) ([place]) 2007   EMPHYSEMA  . GERD (gastroesophageal reflux disease) 2010  . Hepatitis    Alcoholic   . Hypertension     MEDICATIONS:  Prior to Admission medications   Medication Sig Start Date End Date Taking? Authorizing Provider  albuterol (PROVENTIL HFA;VENTOLIN HFA) 108 (90 Base) MCG/ACT inhaler Inhale 2 puffs into the lungs every 6 (six) hours as needed for wheezing or shortness of breath. 11/02/15  Yes Soyla Dryer, PA-C  aspirin 81 MG tablet Take 81 mg by mouth daily.   Yes Historical Provider, MD  citalopram (CELEXA) 20 MG tablet Take 20 mg by mouth daily.   Yes Historical Provider, MD  Cyanocobalamin 2500 MCG TABS Take 500 mcg by mouth daily.    Yes Historical Provider, MD  diphenhydrAMINE (BENADRYL) 25 MG tablet Take 25 mg by mouth daily as needed for allergies.   Yes Historical Provider, MD  metoprolol (LOPRESSOR) 100 MG tablet Take 1 tablet (100 mg total) by mouth 2 (two) times daily. 01/25/16  Yes Soyla Dryer, PA-C  ranitidine (ZANTAC) 300 MG tablet TAKE ONE  TABLET BY MOUTH AT BEDTIME 02/16/16  Yes Soyla Dryer, PA-C  traZODone (DESYREL) 100 MG tablet Take 100 mg by mouth at bedtime.   Yes Historical Provider, MD  metoprolol (LOPRESSOR) 100 MG tablet TAKE ONE TABLET BY MOUTH TWICE DAILY 05/23/16   Soyla Dryer, PA-C    ALLERGIES:  No Known Allergies  SOCIAL HISTORY:  Social History  Substance Use Topics  . Smoking status: Current Every Day Smoker    Packs/day: 1.00    Years: 37.00    Types: Cigarettes  . Smokeless tobacco: Never Used  . Alcohol use 13.2 oz/week    21 Cans of beer, 1 Standard drinks or equivalent per week     Comment: dry since beginning march 2017.  alcoholic    FAMILY HISTORY: Family History  Problem Relation Age of Onset  . Hypertension Mother   . Asthma Father   . Diabetes Sister   . Hypertension Sister   . Hypertension Brother   . Hypertension Sister   . Lupus Daughter     EXAM: BP 190/97 (BP Location: Left Arm)   Pulse 94   Temp 97.7 F (36.5 C) (Oral)   Resp 22   Ht '5\' 9"'$  (1.753 m)   Wt 152 lb (68.9 kg)   SpO2 100%   BMI 22.45 kg/m  CONSTITUTIONAL: Alert and oriented and responds appropriately to questions. Appears uncomfortable, afebrile and nontoxic, actively vomiting HEAD: Normocephalic EYES: Conjunctivae clear, PERRL, EOMI ENT: normal nose; no rhinorrhea; moist mucous membranes NECK: Supple,  no meningismus, no nuchal rigidity, no LAD  CARD: RRR; S1 and S2 appreciated; no murmurs, no clicks, no rubs, no gallops RESP: Normal chest excursion without splinting or tachypnea; breath sounds clear and equal bilaterally; no wheezes, no rhonchi, no rales, no hypoxia or respiratory distress, speaking full sentences ABD/GI: Normal bowel sounds; non-distended; soft, minimally tender in the epigastric region, no rebound, no guarding, no peritoneal signs, no hepatosplenomegaly BACK:  The back appears normal and is non-tender to palpation, there is no CVA tenderness EXT: Normal ROM in all joints;  non-tender to palpation; no edema; normal capillary refill; no cyanosis, no calf tenderness or swelling    SKIN: Normal color for age and race; warm; no rash NEURO: Moves all extremities equally, sensation to light touch intact diffusely, cranial nerves II through XII intact, normal speech PSYCH: The patient's mood and manner are appropriate. Grooming and personal hygiene are appropriate.  MEDICAL DECISION MAKING: Patient here with epigastric pain, nausea and vomiting. Differential diagnosis includes gastritis, pancreatitis, ACS, cholelithiasis or cholecystitis. We'll obtain labs including troponin, EKG. We'll give IV fluids, morphine, Zofran and reassess.  ED PROGRESS: Patient reports feeling better after morphine and Zofran. He repeats that this is similar to his prior GERD. We'll give GI cocktail and Protonix.   Labs show elevated lipase of 1498. AST mildly elevated and total bilirubin mildly elevated. He has never had pancreatitis before. Will proceed with CT imaging. Troponin is negative. Chest x-ray clear. EKG shows no ischemic abnormality.   Patient CT scan shows acute pancreatitis without necrosis or pseudocyst. Patient reports feeling much better and has not required pain medication or antiemetics in several hours. He is requesting discharge home. I have offered to admit him to the hospitalist for IV fluids, bowel rest and pain control. He declines this. Have discussed with him that his alcohol abuse is what is causing his pancreatitis and recommended he quit drinking. We'll give him outpatient follow-up information for rehabilitation. We'll discharge with short course of pain and nausea medicine. He has a PCP for follow-up. He states he has an appointment next week. Have recommended that he only drinks fluids for the next couple of days and then slowly transition to a bland diet. Have recommended if his symptoms worsen or he begins vomiting again he should return to the hospital as he may need  admission. He verbalizes understanding and is comfortable with this plan. Again have offered admission several times which he declines.  At this time, I do not feel there is any life-threatening condition present. I have reviewed and discussed all results (EKG, imaging, lab, urine as appropriate) and exam findings with patient/family. I have reviewed nursing notes and appropriate previous records.  I feel the patient is safe to be discharged home without further emergent workup and can continue workup as an outpatient as needed. Discussed usual and customary return precautions. Patient/family verbalize understanding and are comfortable with this plan.  Outpatient follow-up has been provided. All questions have been answered.      EKG Interpretation  Date/Time:  Thursday June 16 2016 02:49:33 EST Ventricular Rate:  79 PR Interval:    QRS Duration: 82 QT Interval:  420 QTC Calculation: 482 R Axis:   81 Text Interpretation:  Sinus rhythm Consider left atrial enlargement Anteroseptal infarct, age indeterminate No significant change since last tracing Confirmed by Juvencio Verdi,  DO, Derita Michelsen [phone]) on 06/16/2016 3:58:19 AM          [place], DO 06/16/16 [id number]

## 2016-06-16 NOTE — ED Triage Notes (Signed)
Abd pain with vomiting and small amt of diarrhea onset yesterday.

## 2016-06-16 NOTE — Discharge Instructions (Signed)
Please stop drinking alcohol. This is the cause of your pancreatitis. We have offered you admission but you have declined and decided he wanted to go home. Please follow-up with your primary care physician next week as scheduled. I recommend that you drink water, broth, gatorade to stay hydrated but avoid any solid food for the next 2 days and then slowly advance your diet to a bland diet. If you develop worsening pain, fever, vomiting and cannot stop, please return to the hospital. Please note that it is unsafe to take narcotic pain medication such as hydrocodone when drinking. If you need help with your alcohol use, we have provided you with outpatient and inpatient services for rehabilitation.

## 2016-06-21 ENCOUNTER — Ambulatory Visit: Payer: Self-pay | Admitting: Physician Assistant

## 2016-06-21 ENCOUNTER — Encounter: Payer: Self-pay | Admitting: Physician Assistant

## 2016-06-21 VITALS — BP 120/76 | HR 82 | Temp 97.7°F | Ht 68.0 in | Wt 149.5 lb

## 2016-06-21 DIAGNOSIS — K219 Gastro-esophageal reflux disease without esophagitis: Secondary | ICD-10-CM

## 2016-06-21 DIAGNOSIS — I10 Essential (primary) hypertension: Secondary | ICD-10-CM

## 2016-06-21 DIAGNOSIS — F32A Depression, unspecified: Secondary | ICD-10-CM

## 2016-06-21 DIAGNOSIS — F329 Major depressive disorder, single episode, unspecified: Secondary | ICD-10-CM

## 2016-06-21 DIAGNOSIS — R062 Wheezing: Secondary | ICD-10-CM

## 2016-06-21 DIAGNOSIS — J449 Chronic obstructive pulmonary disease, unspecified: Secondary | ICD-10-CM

## 2016-06-21 DIAGNOSIS — F17218 Nicotine dependence, cigarettes, with other nicotine-induced disorders: Secondary | ICD-10-CM

## 2016-06-21 DIAGNOSIS — F411 Generalized anxiety disorder: Secondary | ICD-10-CM

## 2016-06-21 MED ORDER — ALBUTEROL SULFATE (2.5 MG/3ML) 0.083% IN NEBU
2.5000 mg | INHALATION_SOLUTION | Freq: Once | RESPIRATORY_TRACT | Status: AC
  Administered 2016-06-21: 2.5 mg via RESPIRATORY_TRACT

## 2016-06-21 NOTE — Progress Notes (Signed)
BP 120/76 (BP Location: Left Arm, Patient Position: Sitting, Cuff Size: Normal)   Pulse 82   Temp 97.7 F (36.5 C)   Ht '5\' 8"'$  (1.727 m)   Wt 149 lb 8 oz (67.8 kg)   SpO2 99%   BMI 22.73 kg/m    Subjective:    Patient ID: Bryan Fleming, male    DOB: 06/11/1961, 55 y.o.   MRN: [id number]  HPI: Bryan Fleming is a 55 y.o. male presenting on 06/21/2016 for Hypertension and Follow-up   HPI   Pt is going to [facility] now.   Pt says he is just now getting in with that.  He is still drinking some.    He has appointment coming up soon at [facility]  He reports Some wheezing. denies sob or cp  Relevant past medical, surgical, family and social history reviewed and updated as indicated. Interim medical history since our last visit reviewed. Allergies and medications reviewed and updated.   Current Outpatient Prescriptions:  .  albuterol (PROVENTIL HFA;VENTOLIN HFA) 108 (90 Base) MCG/ACT inhaler, Inhale 2 puffs into the lungs every 6 (six) hours as needed for wheezing or shortness of breath., Disp: 3 Inhaler, Rfl: 1 .  aspirin 81 MG tablet, Take 81 mg by mouth daily., Disp: , Rfl:  .  citalopram (CELEXA) 20 MG tablet, Take 20 mg by mouth daily., Disp: , Rfl:  .  Cyanocobalamin 2500 MCG TABS, Take 500-1,000 mcg by mouth daily. , Disp: , Rfl:  .  diphenhydrAMINE (BENADRYL) 25 MG tablet, Take 25 mg by mouth daily as needed for allergies., Disp: , Rfl:  .  HYDROcodone-acetaminophen (NORCO/VICODIN) 5-325 MG tablet, Take 1-2 tablets by mouth every 6 (six) hours as needed., Disp: 20 tablet, Rfl: 0 .  metoprolol (LOPRESSOR) 100 MG tablet, Take 1 tablet (100 mg total) by mouth 2 (two) times daily., Disp: 60 tablet, Rfl: 0 .  ondansetron (ZOFRAN ODT) 4 MG disintegrating tablet, Take 1 tablet (4 mg total) by mouth every 8 (eight) hours as needed for nausea or vomiting., Disp: 20 tablet, Rfl: 0 .  ranitidine (ZANTAC) 300 MG tablet, TAKE ONE TABLET BY MOUTH AT BEDTIME, Disp: 30 tablet, Rfl:  6 .  traZODone (DESYREL) 100 MG tablet, Take 100 mg by mouth at bedtime., Disp: , Rfl:   Review of Systems  Constitutional: Negative for appetite change, chills, diaphoresis, fatigue, fever and unexpected weight change.  HENT: Negative for congestion, dental problem, drooling, ear pain, facial swelling, hearing loss, mouth sores, sneezing, sore throat, trouble swallowing and voice change.   Eyes: Negative for pain, discharge, redness, itching and visual disturbance.  Respiratory: Positive for wheezing. Negative for cough, choking and shortness of breath.   Cardiovascular: Negative for chest pain, palpitations and leg swelling.  Gastrointestinal: Negative for abdominal pain, blood in stool, constipation, diarrhea and vomiting.  Endocrine: Negative for cold intolerance, heat intolerance and polydipsia.  Genitourinary: Negative for decreased urine volume, dysuria and hematuria.  Musculoskeletal: Negative for arthralgias, back pain and gait problem.  Skin: Negative for rash.  Allergic/Immunologic: Negative for environmental allergies.  Neurological: Negative for seizures, syncope, light-headedness and headaches.  Hematological: Negative for adenopathy.  Psychiatric/Behavioral: Positive for dysphoric mood. Negative for agitation and suicidal ideas. The patient is nervous/anxious.     Per HPI unless specifically indicated above     Objective:    BP 120/76 (BP Location: Left Arm, Patient Position: Sitting, Cuff Size: Normal)   Pulse 82   Temp 97.7 F (36.5 C)  Ht '5\' 8"'$  (1.727 m)   Wt 149 lb 8 oz (67.8 kg)   SpO2 99%   BMI 22.73 kg/m   Wt Readings from Last 3 Encounters:  06/21/16 149 lb 8 oz (67.8 kg)  06/16/16 152 lb (68.9 kg)  06/02/16 152 lb 9.6 oz (69.2 kg)    Physical Exam  Constitutional: He is oriented to person, place, and time. He appears well-developed and well-nourished.  HENT:  Head: Normocephalic and atraumatic.  Neck: Neck supple.  Cardiovascular: Normal rate and  regular rhythm.   Pulmonary/Chest: Effort normal. No respiratory distress. He has wheezes. He has no rhonchi. He has no rales.  After nebulizer treatment, wheezes resolved  Abdominal: Soft. Bowel sounds are normal. There is no hepatosplenomegaly. There is no tenderness.  Musculoskeletal: He exhibits no edema.  Lymphadenopathy:    He has no cervical adenopathy.  Neurological: He is alert and oriented to person, place, and time.  Skin: Skin is warm and dry.  Psychiatric: He has a normal mood and affect. His behavior is normal.  Vitals reviewed.       Assessment & Plan:    Encounter Diagnoses  Name Primary?  . Hypertension, unspecified type Yes  . Wheezing   . Chronic obstructive pulmonary disease, unspecified COPD type ([place])   . Nicotine dependence, cigarettes, with other nicotine-induced disorders   . Gastroesophageal reflux disease, esophagitis presence not specified   . Anxiety state   . Depression, unspecified depression type     -Gave crisis number to call if he needs it.  He is to follow up with [facility] as scheduled.  He is given another list of local AA meetings and encouraged to avoid drinking -he has MDI to use prn wheezing.  Discussed would like to avoid prednisone if possible due to his anxiety -bp good today. Encouraged pt to take his medications regularly as prescribed -counseled on GERD and discussed how alcohol and smoking make this worse.  He has ranitidine to take and if needed can try some OTC  Omeprazole. Gave reading information on GERD -follow up in 3 months.   RTO sooner prn sob, wheezing, MH or other problem  (The duration of this appointment visit was 25 minutes of face-to-face time with the patient.  Greater than 50% of this time was spent in counseling, explanation of diagnosis, planning of further management, and coordination of care.)

## 2016-06-21 NOTE — Patient Instructions (Signed)
Gastroesophageal Reflux Disease, Adult Normally, food travels down the esophagus and stays in the stomach to be digested. However, when a person has gastroesophageal reflux disease (GERD), food and stomach acid move back up into the esophagus. When this happens, the esophagus becomes sore and inflamed. Over time, GERD can create small holes (ulcers) in the lining of the esophagus. What are the causes? This condition is caused by a problem with the muscle between the esophagus and the stomach (lower esophageal sphincter, or LES). Normally, the LES muscle closes after food passes through the esophagus to the stomach. When the LES is weakened or abnormal, it does not close properly, and that allows food and stomach acid to go back up into the esophagus. The LES can be weakened by certain dietary substances, medicines, and medical conditions, including:  Tobacco use.  Pregnancy.  Having a hiatal hernia.  Heavy alcohol use.  Certain foods and beverages, such as coffee, chocolate, onions, and peppermint.  What increases the risk? This condition is more likely to develop in:  People who have an increased body weight.  People who have connective tissue disorders.  People who use NSAID medicines.  What are the signs or symptoms? Symptoms of this condition include:  Heartburn.  Difficult or painful swallowing.  The feeling of having a lump in the throat.  Abitter taste in the mouth.  Bad breath.  Having a large amount of saliva.  Having an upset or bloated stomach.  Belching.  Chest pain.  Shortness of breath or wheezing.  Ongoing (chronic) cough or a night-time cough.  Wearing away of tooth enamel.  Weight loss.  Different conditions can cause chest pain. Make sure to see your health care provider if you experience chest pain. How is this diagnosed? Your health care provider will take a medical history and perform a physical exam. To determine if you have mild or severe  GERD, your health care provider may also monitor how you respond to treatment. You may also have other tests, including:  An endoscopy toexamine your stomach and esophagus with a small camera.  A test thatmeasures the acidity level in your esophagus.  A test thatmeasures how much pressure is on your esophagus.  A barium swallow or modified barium swallow to show the shape, size, and functioning of your esophagus.  How is this treated? The goal of treatment is to help relieve your symptoms and to prevent complications. Treatment for this condition may vary depending on how severe your symptoms are. Your health care provider may recommend:  Changes to your diet.  Medicine.  Surgery.  Follow these instructions at home: Diet  Follow a diet as recommended by your health care provider. This may involve avoiding foods and drinks such as: ? Coffee and tea (with or without caffeine). ? Drinks that containalcohol. ? Energy drinks and sports drinks. ? Carbonated drinks or sodas. ? Chocolate and cocoa. ? Peppermint and mint flavorings. ? Garlic and onions. ? Horseradish. ? Spicy and acidic foods, including peppers, chili powder, curry powder, vinegar, hot sauces, and barbecue sauce. ? Citrus fruit juices and citrus fruits, such as oranges, lemons, and limes. ? Tomato-based foods, such as red sauce, chili, salsa, and pizza with red sauce. ? Fried and fatty foods, such as donuts, french fries, potato chips, and high-fat dressings. ? High-fat meats, such as hot dogs and fatty cuts of red and white meats, such as rib eye steak, sausage, ham, and bacon. ? High-fat dairy items, such as whole milk,   butter, and cream cheese.  Eat small, frequent meals instead of large meals.  Avoid drinking large amounts of liquid with your meals.  Avoid eating meals during the 2-3 hours before bedtime.  Avoid lying down right after you eat.  Do not exercise right after you eat. General  instructions  Pay attention to any changes in your symptoms.  Take over-the-counter and prescription medicines only as told by your health care provider. Do not take aspirin, ibuprofen, or other NSAIDs unless your health care provider told you to do so.  Do not use any tobacco products, including cigarettes, chewing tobacco, and e-cigarettes. If you need help quitting, ask your health care provider.  Wear loose-fitting clothing. Do not wear anything tight around your waist that causes pressure on your abdomen.  Raise (elevate) the head of your bed 6 inches (15cm).  Try to reduce your stress, such as with yoga or meditation. If you need help reducing stress, ask your health care provider.  If you are overweight, reduce your weight to an amount that is healthy for you. Ask your health care provider for guidance about a safe weight loss goal.  Keep all follow-up visits as told by your health care provider. This is important. Contact a health care provider if:  You have new symptoms.  You have unexplained weight loss.  You have difficulty swallowing, or it hurts to swallow.  You have wheezing or a persistent cough.  Your symptoms do not improve with treatment.  You have a hoarse voice. Get help right away if:  You have pain in your arms, neck, jaw, teeth, or back.  You feel sweaty, dizzy, or light-headed.  You have chest pain or shortness of breath.  You vomit and your vomit looks like blood or coffee grounds.  You faint.  Your stool is bloody or black.  You cannot swallow, drink, or eat. This information is not intended to replace advice given to you by your health care provider. Make sure you discuss any questions you have with your health care provider. Document Released: 05/04/2005 Document Revised: 12/23/2015 Document Reviewed: 11/19/2014 [employer] Interactive Patient Education  2017 [employer]  

## 2016-06-28 ENCOUNTER — Other Ambulatory Visit: Payer: Self-pay | Admitting: Physician Assistant

## 2016-06-28 MED ORDER — METOPROLOL TARTRATE 100 MG PO TABS: 100.0000 mg | ORAL_TABLET | Freq: Two times a day (BID) | ORAL | 1 refills | Status: DC

## 2016-06-28 MED ORDER — RANITIDINE HCL 300 MG PO TABS: 300.0000 mg | ORAL_TABLET | Freq: Every day | ORAL | 2 refills | Status: DC

## 2016-09-21 ENCOUNTER — Ambulatory Visit: Payer: Self-pay | Admitting: Physician Assistant

## 2016-09-22 ENCOUNTER — Encounter: Payer: Self-pay | Admitting: Physician Assistant

## 2016-11-11 ENCOUNTER — Other Ambulatory Visit: Payer: Self-pay | Admitting: Physician Assistant

## 2016-11-21 ENCOUNTER — Ambulatory Visit: Payer: Self-pay | Admitting: Physician Assistant

## 2016-11-21 ENCOUNTER — Encounter: Payer: Self-pay | Admitting: Physician Assistant

## 2016-11-21 VITALS — BP 118/66 | HR 96 | Temp 97.9°F | Ht 68.0 in | Wt 156.8 lb

## 2016-11-21 DIAGNOSIS — K219 Gastro-esophageal reflux disease without esophagitis: Secondary | ICD-10-CM

## 2016-11-21 DIAGNOSIS — I10 Essential (primary) hypertension: Secondary | ICD-10-CM

## 2016-11-21 DIAGNOSIS — R945 Abnormal results of liver function studies: Secondary | ICD-10-CM

## 2016-11-21 DIAGNOSIS — F32A Depression, unspecified: Secondary | ICD-10-CM

## 2016-11-21 DIAGNOSIS — F419 Anxiety disorder, unspecified: Secondary | ICD-10-CM

## 2016-11-21 DIAGNOSIS — R7989 Other specified abnormal findings of blood chemistry: Secondary | ICD-10-CM

## 2016-11-21 DIAGNOSIS — F1010 Alcohol abuse, uncomplicated: Secondary | ICD-10-CM

## 2016-11-21 DIAGNOSIS — J449 Chronic obstructive pulmonary disease, unspecified: Secondary | ICD-10-CM

## 2016-11-21 DIAGNOSIS — F329 Major depressive disorder, single episode, unspecified: Secondary | ICD-10-CM

## 2016-11-21 DIAGNOSIS — F17218 Nicotine dependence, cigarettes, with other nicotine-induced disorders: Secondary | ICD-10-CM

## 2016-11-21 MED ORDER — ALBUTEROL SULFATE HFA 108 (90 BASE) MCG/ACT IN AERS: 2.0000 | INHALATION_SPRAY | Freq: Four times a day (QID) | RESPIRATORY_TRACT | 0 refills | Status: DC | PRN

## 2016-11-21 MED ORDER — OMEPRAZOLE 40 MG PO CPDR: 40.0000 mg | DELAYED_RELEASE_CAPSULE | Freq: Every day | ORAL | 1 refills | Status: DC

## 2016-11-21 NOTE — Progress Notes (Signed)
BP 118/66 (BP Location: Left Arm, Patient Position: Sitting, Cuff Size: Normal)   Pulse 96   Temp 97.9 F (36.6 C)   Ht '5\' 8"'$  (1.727 m)   Wt 156 lb 12 oz (71.1 kg)   SpO2 95%   BMI 23.83 kg/m    Subjective:    Patient ID: Bryan Fleming, male    DOB: [birth year], 56 y.o.   MRN: [id number]  HPI: Bryan Fleming is a 56 y.o. male presenting on 11/21/2016 for Follow-up and Gastroesophageal Reflux (pt states he needs another med because ranitidine is not working very well now)   HPI   Pt no-showed for his february appt.  Pt only came in now because he needed to recertify for [facility] and couldn't do that without appointment  [facility] is [facility] to [facility] for [place] issues.    Pt feels like his rantidine doesn't help.    Pt is still drinking and smoking.   Drinks every day still but says not as much.  He says he is trying to work on that himself.    Relevant past medical, surgical, family and social history reviewed and updated as indicated. Interim medical history since our last visit reviewed. Allergies and medications reviewed and updated.   Current Outpatient Prescriptions:  .  albuterol (PROVENTIL HFA;VENTOLIN HFA) 108 (90 Base) MCG/ACT inhaler, Inhale 2 puffs into the lungs every 6 (six) hours as needed for wheezing or shortness of breath., Disp: 3 Inhaler, Rfl: 1 .  aspirin 81 MG tablet, Take 81 mg by mouth daily., Disp: , Rfl:  .  citalopram (CELEXA) 20 MG tablet, Take 20 mg by mouth daily., Disp: , Rfl:  .  Cyanocobalamin 2500 MCG TABS, Take 500-1,000 mcg by mouth daily. , Disp: , Rfl:  .  diphenhydrAMINE (BENADRYL) 25 MG tablet, Take 25 mg by mouth daily as needed for allergies., Disp: , Rfl:  .  metoprolol (LOPRESSOR) 100 MG tablet, Take 1 tablet (100 mg total) by mouth 2 (two) times daily., Disp: 180 tablet, Rfl: 1 .  ranitidine (ZANTAC) 300 MG tablet, Take 1 tablet (300 mg total) by mouth at bedtime., Disp: 90 tablet, Rfl: 2 .  simethicone (MYLICON) 921 MG  chewable tablet, Chew 125 mg by mouth every 6 (six) hours as needed for flatulence., Disp: , Rfl:  .  traZODone (DESYREL) 100 MG tablet, Take 100 mg by mouth at bedtime., Disp: , Rfl:    Review of Systems  Constitutional: Positive for fatigue. Negative for appetite change, chills, diaphoresis, fever and unexpected weight change.  HENT: Negative for congestion, dental problem, drooling, ear pain, facial swelling, hearing loss, mouth sores, sneezing, sore throat, trouble swallowing and voice change.   Eyes: Negative for pain, discharge, redness, itching and visual disturbance.  Respiratory: Positive for shortness of breath and wheezing. Negative for cough and choking.   Cardiovascular: Negative for chest pain, palpitations and leg swelling.  Gastrointestinal: Positive for abdominal pain. Negative for blood in stool, constipation, diarrhea and vomiting.  Endocrine: Negative for cold intolerance, heat intolerance and polydipsia.  Genitourinary: Negative for decreased urine volume, dysuria and hematuria.  Musculoskeletal: Positive for arthralgias and back pain. Negative for gait problem.  Skin: Negative for rash.  Allergic/Immunologic: Negative for environmental allergies.  Neurological: Negative for seizures, syncope, light-headedness and headaches.  Hematological: Negative for adenopathy.  Psychiatric/Behavioral: Negative for agitation, dysphoric mood and suicidal ideas. The patient is not nervous/anxious.     Per HPI unless specifically indicated above  Objective:    BP 118/66 (BP Location: Left Arm, Patient Position: Sitting, Cuff Size: Normal)   Pulse 96   Temp 97.9 F (36.6 C)   Ht '5\' 8"'$  (1.727 m)   Wt 156 lb 12 oz (71.1 kg)   SpO2 95%   BMI 23.83 kg/m   Wt Readings from Last 3 Encounters:  11/21/16 156 lb 12 oz (71.1 kg)  06/21/16 149 lb 8 oz (67.8 kg)  06/16/16 152 lb (68.9 kg)    Physical Exam  Constitutional: He is oriented to person, place, and time. He appears  well-developed and well-nourished.  HENT:  Head: Normocephalic and atraumatic.  Neck: Neck supple.  Cardiovascular: Normal rate and regular rhythm.   Pulmonary/Chest: Effort normal and breath sounds normal. He has no wheezes.  Abdominal: Soft. Bowel sounds are normal. There is hepatomegaly. There is no splenomegaly. There is no tenderness.  Musculoskeletal: He exhibits no edema.  Lymphadenopathy:    He has no cervical adenopathy.  Neurological: He is alert and oriented to person, place, and time.  Skin: Skin is warm and dry.  Psychiatric: He has a normal mood and affect. His behavior is normal.  Vitals reviewed.       Assessment & Plan:    Encounter Diagnoses  Name Primary?  . Hypertension, unspecified type Yes  . Elevated liver function tests   . Alcohol abuse   . Chronic obstructive pulmonary disease, unspecified COPD type ([place])   . Nicotine dependence, cigarettes, with other nicotine-induced disorders   . Gastroesophageal reflux disease, esophagitis presence not specified   . Anxiety   . Depression, unspecified depression type     -Needs recheck cmet/labs today -Gave AA information and counseled him that help is available -recertify with  medassist -will Change to omeprazole for gerd since pt still symptomatic on ranitidine -Pt to continue with [facility] -follow up 3 months.  RTO sooner prn

## 2016-11-22 LAB — COMPREHENSIVE METABOLIC PANEL
ALT: 8 U/L — AB (ref 9–46)
AST: 18 U/L (ref 10–35)
Albumin: 4.3 g/dL (ref 3.6–5.1)
Alkaline Phosphatase: 71 U/L (ref 40–115)
BUN: 10 mg/dL (ref 7–25)
CO2: 27 mmol/L (ref 20–31)
Calcium: 9.8 mg/dL (ref 8.6–10.3)
Chloride: 98 mmol/L (ref 98–110)
Creat: 0.64 mg/dL — ABNORMAL LOW (ref 0.70–1.33)
Glucose, Bld: 84 mg/dL (ref 65–99)
Potassium: 4.3 mmol/L (ref 3.5–5.3)
SODIUM: 136 mmol/L (ref 135–146)
Total Bilirubin: 0.6 mg/dL (ref 0.2–1.2)
Total Protein: 7.7 g/dL (ref 6.1–8.1)

## 2017-01-12 ENCOUNTER — Other Ambulatory Visit: Payer: Self-pay | Admitting: Physician Assistant

## 2017-01-12 MED ORDER — ALBUTEROL SULFATE HFA 108 (90 BASE) MCG/ACT IN AERS: 2.0000 | INHALATION_SPRAY | Freq: Four times a day (QID) | RESPIRATORY_TRACT | 1 refills | Status: DC | PRN

## 2017-01-25 ENCOUNTER — Other Ambulatory Visit: Payer: Self-pay | Admitting: Physician Assistant

## 2017-01-25 MED ORDER — OMEPRAZOLE 40 MG PO CPDR: 40.0000 mg | DELAYED_RELEASE_CAPSULE | Freq: Every day | ORAL | 1 refills | Status: DC

## 2017-01-25 MED ORDER — METOPROLOL TARTRATE 100 MG PO TABS: 100.0000 mg | ORAL_TABLET | Freq: Two times a day (BID) | ORAL | 1 refills | Status: DC

## 2017-02-20 ENCOUNTER — Ambulatory Visit: Payer: Self-pay | Admitting: Physician Assistant

## 2017-03-07 ENCOUNTER — Encounter: Payer: Self-pay | Admitting: Physician Assistant

## 2017-03-21 ENCOUNTER — Ambulatory Visit: Payer: Self-pay | Admitting: Physician Assistant

## 2017-03-21 ENCOUNTER — Encounter: Payer: Self-pay | Admitting: Physician Assistant

## 2017-03-21 VITALS — BP 176/90 | HR 79 | Ht 68.0 in | Wt 163.8 lb

## 2017-03-21 DIAGNOSIS — J209 Acute bronchitis, unspecified: Secondary | ICD-10-CM

## 2017-03-21 DIAGNOSIS — J449 Chronic obstructive pulmonary disease, unspecified: Secondary | ICD-10-CM

## 2017-03-21 DIAGNOSIS — J440 Chronic obstructive pulmonary disease with acute lower respiratory infection: Principal | ICD-10-CM

## 2017-03-21 DIAGNOSIS — R062 Wheezing: Secondary | ICD-10-CM

## 2017-03-21 DIAGNOSIS — I10 Essential (primary) hypertension: Secondary | ICD-10-CM

## 2017-03-21 DIAGNOSIS — F17218 Nicotine dependence, cigarettes, with other nicotine-induced disorders: Secondary | ICD-10-CM

## 2017-03-21 MED ORDER — IPRATROPIUM-ALBUTEROL 0.5-2.5 (3) MG/3ML IN SOLN
3.0000 mL | Freq: Once | RESPIRATORY_TRACT | Status: AC
  Administered 2017-03-21: 3 mL via RESPIRATORY_TRACT

## 2017-03-21 MED ORDER — AMOXICILLIN 500 MG PO CAPS: 500.0000 mg | ORAL_CAPSULE | Freq: Three times a day (TID) | ORAL | 0 refills | Status: DC

## 2017-03-21 MED ORDER — PREDNISONE 20 MG PO TABS: 20.0000 mg | ORAL_TABLET | Freq: Two times a day (BID) | ORAL | 0 refills | Status: AC

## 2017-03-21 NOTE — Progress Notes (Signed)
BP (!) 176/90 (BP Location: Left Arm, Patient Position: Sitting, Cuff Size: Normal)   Pulse 79   Ht 5\' 8"  (1.727 m)   Wt 163 lb 12 oz (74.3 kg)   SpO2 94%   BMI 24.90 kg/m    Subjective:    Patient ID: Bryan Fleming, male    DOB: Nov 01, 1960, 56 y.o.   MRN: [id number]  HPI: Bryan Fleming is a 56 y.o. male presenting on 03/21/2017 for Epistaxis (pt states it has been going on off and off. pt states it occurs mostly when he is coughing or sneezing really hard. pt states mostly in the mornings, but also occurs during the day)   HPI   Chief Complaint  Patient presents with  . Epistaxis    pt states it has been going on off and off. pt states it occurs mostly when he is coughing or sneezing really hard. pt states mostly in the mornings, but also occurs during the day     Pt no-showed for his regular OV last month.  Pt with long history non-compliance with therapy and self-destructive behavior including alcohol abuse and cigarette smoking and missing appointments.   Pt says the nose bleeding starts while he is coughing.    He c/o allergies and sore throat.  Subjective fever.  Says nose bleeds only from the R and it is usually about a tablespoon or less. He says it only bleed for a minute or so and then stops with pinching his nose.   He says that he feels like he has a cold.  He says he has been wheezing some.  No chest pain or HA.  No changes in vision.   Pt says he hasn't been to [facility] "in a while"  Pt states he hasn't eaten anything today and has only drank water.  He says he took his metoprolol this morning.   Relevant past medical, surgical, family and social history reviewed and updated as indicated. Interim medical history since our last visit reviewed. Allergies and medications reviewed and updated.   Current Outpatient Prescriptions:  .  albuterol (PROVENTIL HFA;VENTOLIN HFA) 108 (90 Base) MCG/ACT inhaler, Inhale 2 puffs into the lungs every 6 (six) hours as  needed for wheezing or shortness of breath., Disp: 1 Inhaler, Rfl: 1 .  aspirin 81 MG tablet, Take 81 mg by mouth daily., Disp: , Rfl:  .  citalopram (CELEXA) 20 MG tablet, Take 20 mg by mouth daily., Disp: , Rfl:  .  Cyanocobalamin 2500 MCG TABS, Take 500-1,000 mcg by mouth daily. , Disp: , Rfl:  .  diphenhydrAMINE (BENADRYL) 25 MG tablet, Take 25 mg by mouth daily as needed for allergies., Disp: , Rfl:  .  metoprolol tartrate (LOPRESSOR) 100 MG tablet, Take 1 tablet (100 mg total) by mouth 2 (two) times daily., Disp: 60 tablet, Rfl: 1 .  omeprazole (PRILOSEC) 40 MG capsule, Take 1 capsule (40 mg total) by mouth daily., Disp: 30 capsule, Rfl: 1 .  ondansetron (ZOFRAN) 4 MG tablet, Take 4 mg by mouth every 8 (eight) hours as needed for nausea or vomiting., Disp: , Rfl:  .  traZODone (DESYREL) 100 MG tablet, Take 100 mg by mouth at bedtime., Disp: , Rfl:  .  ranitidine (ZANTAC) 300 MG tablet, Take 1 tablet (300 mg total) by mouth at bedtime. (Patient not taking: Reported on 03/21/2017), Disp: 90 tablet, Rfl: 2 .  simethicone (MYLICON) 124 MG chewable tablet, Chew 125 mg by mouth every 6 (six) hours  as needed for flatulence., Disp: , Rfl:    Review of Systems  Constitutional: Positive for appetite change, diaphoresis and fever (subjective). Negative for chills, fatigue and unexpected weight change.  HENT: Positive for sneezing and sore throat. Negative for congestion, drooling, ear pain, facial swelling, hearing loss, mouth sores, trouble swallowing and voice change.   Eyes: Positive for discharge and itching. Negative for pain, redness and visual disturbance.  Respiratory: Positive for shortness of breath and wheezing. Negative for cough and choking.   Cardiovascular: Negative for chest pain, palpitations and leg swelling.  Gastrointestinal: Negative for abdominal pain, blood in stool, constipation, diarrhea and vomiting.  Endocrine: Negative for cold intolerance, heat intolerance and polydipsia.   Genitourinary: Negative for decreased urine volume, dysuria and hematuria.  Musculoskeletal: Negative for arthralgias, back pain and gait problem.  Skin: Negative for rash.  Allergic/Immunologic: Negative for environmental allergies.  Neurological: Negative for seizures, syncope, light-headedness and headaches.  Hematological: Negative for adenopathy.  Psychiatric/Behavioral: Negative for agitation, dysphoric mood and suicidal ideas. The patient is not nervous/anxious.     Per HPI unless specifically indicated above     Objective:    BP (!) 176/90 (BP Location: Left Arm, Patient Position: Sitting, Cuff Size: Normal)   Pulse 79   Ht 5\' 8"  (1.727 m)   Wt 163 lb 12 oz (74.3 kg)   SpO2 94%   BMI 24.90 kg/m   Wt Readings from Last 3 Encounters:  03/21/17 163 lb 12 oz (74.3 kg)  11/21/16 156 lb 12 oz (71.1 kg)  06/21/16 149 lb 8 oz (67.8 kg)    Physical Exam  Constitutional: He is oriented to person, place, and time. He appears well-developed and well-nourished.  HENT:  Head: Normocephalic and atraumatic.  Right Ear: Hearing, tympanic membrane, external ear and ear canal normal.  Left Ear: Hearing, tympanic membrane, external ear and ear canal normal.  Nose: No epistaxis.  Mouth/Throat: Uvula is midline and oropharynx is clear and moist. No uvula swelling. No oropharyngeal exudate, posterior oropharyngeal edema, posterior oropharyngeal erythema or tonsillar abscesses.  R nare with infamed mucosa, no active bleeding. No large clots  Neck: Neck supple.  Cardiovascular: Normal rate and regular rhythm.   Pulmonary/Chest: Effort normal. No respiratory distress. He has wheezes. He has no rhonchi. He has no rales.  Breath sounds improved after nebulizer treatment  Abdominal: Soft. Bowel sounds are normal. There is no hepatosplenomegaly. There is no tenderness.  Musculoskeletal: He exhibits no edema.  Lymphadenopathy:    He has no cervical adenopathy.  Neurological: He is alert and  oriented to person, place, and time. He has normal strength. No cranial nerve deficit or sensory deficit. Gait normal.  Skin: Skin is warm and dry.  Psychiatric: He has a normal mood and affect. His behavior is normal.  Vitals reviewed.       Assessment & Plan:   Encounter Diagnoses  Name Primary?  . Acute bronchitis with COPD ([place]) Yes  . Wheezing   . Chronic obstructive pulmonary disease, unspecified COPD type ([place])   . Nicotine dependence, cigarettes, with other nicotine-induced disorders   . Hypertension, unspecified type      -rx amoxil and prednisone.  Pt counseled to use his inhaler regularly.  Avoid smoking.  Encouraged otc saline nasal spray -discussed his anti-hypertensive meds and counseled that he needs to make sure to take them regularly as prescribed.   -pt to follow up for routine and recheck bp in 2 weeks.  He is to RTO sooner  for worsening or new symptoms.  Discussed with pt to go to ER for nose-bleed that persists, CP, acute HA.  Pt states understanding

## 2017-04-04 ENCOUNTER — Ambulatory Visit: Payer: Self-pay | Admitting: Physician Assistant

## 2017-04-05 ENCOUNTER — Ambulatory Visit: Payer: Self-pay | Admitting: Physician Assistant

## 2017-04-05 ENCOUNTER — Other Ambulatory Visit (HOSPITAL_COMMUNITY)
Admission: RE | Admit: 2017-04-05 | Discharge: 2017-04-05 | Disposition: A | Discharge status: Home / Self Care | Payer: Self-pay | Source: Ambulatory Visit | Attending: Physician Assistant | Admitting: Physician Assistant

## 2017-04-05 ENCOUNTER — Encounter: Payer: Self-pay | Admitting: Physician Assistant

## 2017-04-05 VITALS — BP 106/62 | HR 93 | Temp 97.9°F | Ht 68.0 in | Wt 161.5 lb

## 2017-04-05 DIAGNOSIS — Z125 Encounter for screening for malignant neoplasm of prostate: Secondary | ICD-10-CM | POA: Insufficient documentation

## 2017-04-05 DIAGNOSIS — F17218 Nicotine dependence, cigarettes, with other nicotine-induced disorders: Secondary | ICD-10-CM

## 2017-04-05 DIAGNOSIS — I10 Essential (primary) hypertension: Secondary | ICD-10-CM

## 2017-04-05 DIAGNOSIS — R062 Wheezing: Secondary | ICD-10-CM

## 2017-04-05 DIAGNOSIS — J449 Chronic obstructive pulmonary disease, unspecified: Secondary | ICD-10-CM

## 2017-04-05 LAB — BASIC METABOLIC PANEL
Anion gap: 7 (ref 5–15)
BUN: 6 mg/dL (ref 6–20)
CALCIUM: 9 mg/dL (ref 8.9–10.3)
CHLORIDE: 107 mmol/L (ref 101–111)
CO2: 29 mmol/L (ref 22–32)
CREATININE: 0.85 mg/dL (ref 0.61–1.24)
GFR calc non Af Amer: >60 mL/min (ref >60)
Glucose, Bld: 123 mg/dL — ABNORMAL HIGH (ref 65–99)
Potassium: 4.5 mmol/L (ref 3.5–5.1)
Sodium: 143 mmol/L (ref 135–145)

## 2017-04-05 LAB — PSA: Prostatic Specific Antigen: 0.8 ng/mL (ref 0.00–4.00)

## 2017-04-05 MED ORDER — IPRATROPIUM-ALBUTEROL 0.5-2.5 (3) MG/3ML IN SOLN
3.0000 mL | Freq: Once | RESPIRATORY_TRACT | Status: AC
  Administered 2017-04-05: 3 mL via RESPIRATORY_TRACT

## 2017-04-05 MED ORDER — MOMETASONE FURO-FORMOTEROL FUM 100-5 MCG/ACT IN AERO: 2.0000 | INHALATION_SPRAY | Freq: Two times a day (BID) | RESPIRATORY_TRACT | 0 refills | Status: DC

## 2017-04-05 NOTE — Progress Notes (Signed)
BP 106/62 (BP Location: Left Arm, Patient Position: Sitting, Cuff Size: Normal)   Pulse 93   Temp 97.9 F (36.6 C) (Other (Comment))   Ht 5\' 8"  (1.727 m)   Wt 161 lb 8 oz (73.3 kg)   SpO2 93%   BMI 24.56 kg/m    Subjective:    Patient ID: Bryan Fleming, male    DOB: [birth year], 56 y.o.   MRN: [id number]  HPI: Bryan Fleming is a 56 y.o. male presenting on 04/05/2017 for Hypertension (nosebleeds, states stopped once completed antibiotics)   HPI   Chief Complaint  Patient presents with  . Hypertension    nosebleeds, states stopped once completed antibiotics     Pt using mdi 2 or more times daily. He is still smoking  Pt wants to be checked for cancer- all cancer- because his brother was just diagnosed with throat cancer.   Relevant past medical, surgical, family and social history reviewed and updated as indicated. Interim medical history since our last visit reviewed. Allergies and medications reviewed and updated.   Current Outpatient Prescriptions:  .  albuterol (PROVENTIL HFA;VENTOLIN HFA) 108 (90 Base) MCG/ACT inhaler, Inhale 2 puffs into the lungs every 6 (six) hours as needed for wheezing or shortness of breath., Disp: 1 Inhaler, Rfl: 1 .  aspirin 81 MG tablet, Take 81 mg by mouth daily., Disp: , Rfl:  .  citalopram (CELEXA) 20 MG tablet, Take 20 mg by mouth daily., Disp: , Rfl:  .  diphenhydrAMINE (BENADRYL) 25 MG tablet, Take 25 mg by mouth daily as needed for allergies., Disp: , Rfl:  .  metoprolol tartrate (LOPRESSOR) 100 MG tablet, Take 1 tablet (100 mg total) by mouth 2 (two) times daily., Disp: 60 tablet, Rfl: 1 .  omeprazole (PRILOSEC) 40 MG capsule, Take 1 capsule (40 mg total) by mouth daily., Disp: 30 capsule, Rfl: 1 .  ondansetron (ZOFRAN) 4 MG tablet, Take 4 mg by mouth every 8 (eight) hours as needed for nausea or vomiting., Disp: , Rfl:  .  simethicone (MYLICON) 003 MG chewable tablet, Chew 125 mg by mouth every 6 (six) hours as needed for  flatulence., Disp: , Rfl:  .  traZODone (DESYREL) 100 MG tablet, Take 100 mg by mouth at bedtime., Disp: , Rfl:  .  Cyanocobalamin 2500 MCG TABS, Take 500-1,000 mcg by mouth daily. , Disp: , Rfl:  .  ranitidine (ZANTAC) 300 MG tablet, Take 1 tablet (300 mg total) by mouth at bedtime. (Patient not taking: Reported on 03/21/2017), Disp: 90 tablet, Rfl: 2   Review of Systems  Constitutional: Negative for appetite change, chills, diaphoresis, fatigue, fever and unexpected weight change.  HENT: Negative for congestion, dental problem, drooling, ear pain, facial swelling, hearing loss, mouth sores, sneezing, sore throat, trouble swallowing and voice change.   Eyes: Negative for pain, discharge, redness, itching and visual disturbance.  Respiratory: Negative for cough, choking, shortness of breath and wheezing.   Cardiovascular: Negative for chest pain, palpitations and leg swelling.  Gastrointestinal: Negative for abdominal pain, blood in stool, constipation, diarrhea and vomiting.  Endocrine: Negative for cold intolerance, heat intolerance and polydipsia.  Genitourinary: Negative for decreased urine volume, dysuria and hematuria.  Musculoskeletal: Negative for arthralgias, back pain and gait problem.  Skin: Negative for rash.  Allergic/Immunologic: Negative for environmental allergies.  Neurological: Negative for seizures, syncope, light-headedness and headaches.  Hematological: Negative for adenopathy.  Psychiatric/Behavioral: Negative for agitation, dysphoric mood and suicidal ideas. The patient is not nervous/anxious.  Per HPI unless specifically indicated above     Objective:    BP 106/62 (BP Location: Left Arm, Patient Position: Sitting, Cuff Size: Normal)   Pulse 93   Temp 97.9 F (36.6 C) (Other (Comment))   Ht 5\' 8"  (1.727 m)   Wt 161 lb 8 oz (73.3 kg)   SpO2 93%   BMI 24.56 kg/m   Wt Readings from Last 3 Encounters:  04/05/17 161 lb 8 oz (73.3 kg)  03/21/17 163 lb 12 oz  (74.3 kg)  11/21/16 156 lb 12 oz (71.1 kg)    Physical Exam  Constitutional: He is oriented to person, place, and time. He appears well-developed and well-nourished.  HENT:  Head: Normocephalic and atraumatic.  Neck: Neck supple.  Cardiovascular: Normal rate and regular rhythm.   Pulmonary/Chest: Effort normal. No respiratory distress. He has wheezes. He has no rales.  Abdominal: Soft. Bowel sounds are normal. There is no hepatosplenomegaly. There is no tenderness.  Musculoskeletal: He exhibits no edema.  Lymphadenopathy:    He has no cervical adenopathy.  Neurological: He is alert and oriented to person, place, and time.  Skin: Skin is warm and dry.  Psychiatric: He has a normal mood and affect. His behavior is normal.  Vitals reviewed.       Assessment & Plan:    Encounter Diagnoses  Name Primary?  . Chronic obstructive pulmonary disease, unspecified COPD type ([place]) Yes  . Nicotine dependence, cigarettes, with other nicotine-induced disorders   . Wheezing   . Hypertension, unspecified type   . Screening for prostate cancer     -Add dulera- gave sample and rx sent to [facility] -nebulizer treatment given in office -discussed with pt that for colon cancer screening he is UTD with colonoscopy in 2013 with no polyps.  Will have pt update his PSA test to screen for prostate cancer.  Discussed with pt that there is no screening test for throat cancer, but that if that is something that he is concerned about, he should stop smoking to lessen his risk. -counseled pt on Smoking cessation -pt to get labs drawn- PSA, BMP.  Will call with results -follow up 6 weeks to recheck breathing and bp.  RTO sooner prn

## 2017-04-12 ENCOUNTER — Other Ambulatory Visit: Payer: Self-pay | Admitting: Physician Assistant

## 2017-05-08 ENCOUNTER — Ambulatory Visit: Payer: Self-pay | Admitting: Physician Assistant

## 2017-05-16 ENCOUNTER — Encounter: Payer: Self-pay | Admitting: Physician Assistant

## 2017-05-16 ENCOUNTER — Ambulatory Visit: Payer: Self-pay | Admitting: Physician Assistant

## 2017-05-16 VITALS — BP 122/74 | HR 87 | Temp 97.8°F

## 2017-05-16 DIAGNOSIS — J449 Chronic obstructive pulmonary disease, unspecified: Secondary | ICD-10-CM

## 2017-05-16 DIAGNOSIS — I10 Essential (primary) hypertension: Secondary | ICD-10-CM

## 2017-05-16 DIAGNOSIS — N529 Male erectile dysfunction, unspecified: Secondary | ICD-10-CM

## 2017-05-16 DIAGNOSIS — F17218 Nicotine dependence, cigarettes, with other nicotine-induced disorders: Secondary | ICD-10-CM

## 2017-05-16 DIAGNOSIS — J441 Chronic obstructive pulmonary disease with (acute) exacerbation: Secondary | ICD-10-CM

## 2017-05-16 DIAGNOSIS — F32A Depression, unspecified: Secondary | ICD-10-CM

## 2017-05-16 DIAGNOSIS — K219 Gastro-esophageal reflux disease without esophagitis: Secondary | ICD-10-CM

## 2017-05-16 DIAGNOSIS — F1010 Alcohol abuse, uncomplicated: Secondary | ICD-10-CM

## 2017-05-16 DIAGNOSIS — F329 Major depressive disorder, single episode, unspecified: Secondary | ICD-10-CM

## 2017-05-16 MED ORDER — PREDNISONE 50 MG PO TABS: 50.0000 mg | ORAL_TABLET | Freq: Every day | ORAL | 0 refills | Status: AC

## 2017-05-16 MED ORDER — SILDENAFIL CITRATE 100 MG PO TABS: 50.0000 mg | ORAL_TABLET | Freq: Every day | ORAL | 0 refills | Status: DC | PRN

## 2017-05-16 MED ORDER — IPRATROPIUM-ALBUTEROL 0.5-2.5 (3) MG/3ML IN SOLN
3.0000 mL | Freq: Once | RESPIRATORY_TRACT | Status: AC
  Administered 2017-05-16: 3 mL via RESPIRATORY_TRACT

## 2017-05-16 NOTE — Progress Notes (Signed)
BP 122/74   Pulse 87   Temp 97.8 F (36.6 C)   SpO2 96%    Subjective:    Patient ID: Bryan Fleming, male    DOB: Jun 19, 1961, 56 y.o.   MRN: [id number]  HPI: Bryan Fleming is a 56 y.o. male presenting on 05/16/2017 for COPD   HPI   Pt still smoking  Pt is drinking every day  Pt is still going to [facility].   Pt says improvement with addition of dulera but he has noticed some increase in wheezing past several days. No fevers.   Pt says he has a new girlfriend and he has trouble with erections.   Relevant past medical, surgical, family and social history reviewed and updated as indicated. Interim medical history since our last visit reviewed. Allergies and medications reviewed and updated.    Current Outpatient Prescriptions:  .  citalopram (CELEXA) 20 MG tablet, Take 20 mg by mouth daily., Disp: , Rfl:  .  metoprolol tartrate (LOPRESSOR) 100 MG tablet, TAKE 1 Tablet  BY MOUTH TWICE DAILY, Disp: 180 tablet, Rfl: 1 .  mometasone-formoterol (DULERA) 100-5 MCG/ACT AERO, Inhale 2 puffs into the lungs 2 (two) times daily., Disp: 3 Inhaler, Rfl: 0 .  omeprazole (PRILOSEC) 40 MG capsule, Take 1 capsule (40 mg total) by mouth daily., Disp: 30 capsule, Rfl: 1 .  PROVENTIL HFA 108 (90 Base) MCG/ACT inhaler, INHALE 2 PUFFS BY MOUTH EVERY 6 HOURS AS NEEDED FOR COUGHING, WHEEZING, OR SHORTNESS OF BREATH, Disp: 20.1 g, Rfl: 1 .  ranitidine (ZANTAC) 300 MG tablet, Take 1 tablet (300 mg total) by mouth at bedtime., Disp: 90 tablet, Rfl: 2 .  traZODone (DESYREL) 100 MG tablet, Take 100 mg by mouth at bedtime., Disp: , Rfl:  .  aspirin 81 MG tablet, Take 81 mg by mouth daily., Disp: , Rfl:   Review of Systems  Constitutional: Positive for appetite change. Negative for chills, diaphoresis, fatigue, fever and unexpected weight change.  HENT: Negative for congestion, dental problem, drooling, ear pain, facial swelling, hearing loss, mouth sores, sneezing, sore throat, trouble  swallowing and voice change.   Eyes: Positive for discharge and itching. Negative for pain, redness and visual disturbance.  Respiratory: Positive for wheezing. Negative for cough, choking and shortness of breath.   Cardiovascular: Negative for chest pain, palpitations and leg swelling.  Gastrointestinal: Negative for abdominal pain, blood in stool, constipation, diarrhea and vomiting.  Endocrine: Negative for cold intolerance, heat intolerance and polydipsia.  Genitourinary: Negative for decreased urine volume, dysuria and hematuria.  Musculoskeletal: Negative for arthralgias, back pain and gait problem.  Skin: Negative for rash.  Allergic/Immunologic: Negative for environmental allergies.  Neurological: Negative for seizures, syncope, light-headedness and headaches.  Hematological: Negative for adenopathy.  Psychiatric/Behavioral: Negative for agitation, dysphoric mood and suicidal ideas. The patient is not nervous/anxious.     Per HPI unless specifically indicated above     Objective:    BP 122/74   Pulse 87   Temp 97.8 F (36.6 C)   SpO2 96%   Wt Readings from Last 3 Encounters:  04/05/17 161 lb 8 oz (73.3 kg)  03/21/17 163 lb 12 oz (74.3 kg)  11/21/16 156 lb 12 oz (71.1 kg)    Physical Exam  Constitutional: He is oriented to person, place, and time. He appears well-developed and well-nourished.  HENT:  Head: Normocephalic and atraumatic.  Neck: Neck supple.  Cardiovascular: Normal rate and regular rhythm.   Pulmonary/Chest: Effort normal. No respiratory distress. He  has wheezes. He has no rales.  Wheezing much improved after nebulizer treatment in office  Abdominal: Soft. Bowel sounds are normal. There is no hepatosplenomegaly. There is no tenderness.  Musculoskeletal: He exhibits no edema.  Lymphadenopathy:    He has no cervical adenopathy.  Neurological: He is alert and oriented to person, place, and time.  Skin: Skin is warm and dry.  Psychiatric: He has a normal  mood and affect. His behavior is normal.  Vitals reviewed.       Assessment & Plan:   Encounter Diagnoses  Name Primary?  [place] COPD with acute exacerbation ([place]) Yes  . Hypertension, unspecified type   . Chronic obstructive pulmonary disease, unspecified COPD type ([place])   . Nicotine dependence, cigarettes, with other nicotine-induced disorders   . Alcohol abuse   . Erectile dysfunction, unspecified erectile dysfunction type   . Gastroesophageal reflux disease, esophagitis presence not specified   . Depression, unspecified depression type     -counseled pt on need to stop smoking due to his COPD and also discussed how smoking can contribute to ED -continue current medications -add prednisone for COPD exacerbation -rx viagra and counseled pt on how to use it properly -counseled pt on etoh cessation -pt to continue with [facility] for depression -pt to follow up in 3 months. RTO sooner prn

## 2017-05-17 ENCOUNTER — Ambulatory Visit: Payer: Self-pay | Admitting: Physician Assistant

## 2017-08-16 ENCOUNTER — Ambulatory Visit: Payer: Self-pay | Admitting: Physician Assistant

## 2017-08-16 ENCOUNTER — Other Ambulatory Visit: Payer: Self-pay | Admitting: Physician Assistant

## 2017-08-16 ENCOUNTER — Encounter: Payer: Self-pay | Admitting: Physician Assistant

## 2017-08-16 ENCOUNTER — Other Ambulatory Visit (HOSPITAL_COMMUNITY)
Admission: RE | Admit: 2017-08-16 | Discharge: 2017-08-16 | Disposition: A | Discharge status: Home / Self Care | Payer: Self-pay | Source: Ambulatory Visit | Attending: Physician Assistant | Admitting: Physician Assistant

## 2017-08-16 VITALS — BP 126/66 | HR 113 | Temp 97.5°F | Ht 68.0 in | Wt 154.0 lb

## 2017-08-16 DIAGNOSIS — R000 Tachycardia, unspecified: Secondary | ICD-10-CM

## 2017-08-16 DIAGNOSIS — F419 Anxiety disorder, unspecified: Secondary | ICD-10-CM | POA: Insufficient documentation

## 2017-08-16 DIAGNOSIS — F1010 Alcohol abuse, uncomplicated: Secondary | ICD-10-CM

## 2017-08-16 DIAGNOSIS — R7989 Other specified abnormal findings of blood chemistry: Secondary | ICD-10-CM

## 2017-08-16 DIAGNOSIS — Z1211 Encounter for screening for malignant neoplasm of colon: Secondary | ICD-10-CM

## 2017-08-16 DIAGNOSIS — I10 Essential (primary) hypertension: Secondary | ICD-10-CM | POA: Insufficient documentation

## 2017-08-16 DIAGNOSIS — R062 Wheezing: Secondary | ICD-10-CM

## 2017-08-16 DIAGNOSIS — J449 Chronic obstructive pulmonary disease, unspecified: Secondary | ICD-10-CM

## 2017-08-16 DIAGNOSIS — F32A Depression, unspecified: Secondary | ICD-10-CM

## 2017-08-16 DIAGNOSIS — R945 Abnormal results of liver function studies: Secondary | ICD-10-CM | POA: Insufficient documentation

## 2017-08-16 DIAGNOSIS — F329 Major depressive disorder, single episode, unspecified: Secondary | ICD-10-CM

## 2017-08-16 LAB — COMPREHENSIVE METABOLIC PANEL
ALBUMIN: 3.6 g/dL (ref 3.5–5.0)
ALT: 48 U/L (ref 17–63)
ANION GAP: 11 (ref 5–15)
AST: 105 U/L — ABNORMAL HIGH (ref 15–41)
Alkaline Phosphatase: 74 U/L (ref 38–126)
BILIRUBIN TOTAL: 0.3 mg/dL (ref 0.3–1.2)
BUN: 7 mg/dL (ref 6–20)
CO2: 23 mmol/L (ref 22–32)
Calcium: 8.9 mg/dL (ref 8.9–10.3)
Chloride: 108 mmol/L (ref 101–111)
Creatinine, Ser: 0.58 mg/dL — ABNORMAL LOW (ref 0.61–1.24)
GFR calc Af Amer: >60 mL/min (ref >60)
GLUCOSE: 124 mg/dL — AB (ref 65–99)
Potassium: 3.2 mmol/L — ABNORMAL LOW (ref 3.5–5.1)
Sodium: 142 mmol/L (ref 135–145)
TOTAL PROTEIN: 7.1 g/dL (ref 6.5–8.1)

## 2017-08-16 LAB — CBC
HEMATOCRIT: 37.1 % — AB (ref 39.0–52.0)
HEMOGLOBIN: 12.2 g/dL — AB (ref 13.0–17.0)
MCH: 29.3 pg (ref 26.0–34.0)
MCHC: 32.9 g/dL (ref 30.0–36.0)
MCV: 89 fL (ref 78.0–100.0)
Platelets: 204 10*3/uL (ref 150–400)
RBC: 4.17 MIL/uL — ABNORMAL LOW (ref 4.22–5.81)
RDW: 15.3 % (ref 11.5–15.5)
WBC: 6.6 10*3/uL (ref 4.0–10.5)

## 2017-08-16 MED ORDER — IPRATROPIUM-ALBUTEROL 0.5-2.5 (3) MG/3ML IN SOLN
3.0000 mL | Freq: Once | RESPIRATORY_TRACT | Status: AC
  Administered 2017-08-16: 3 mL via RESPIRATORY_TRACT

## 2017-08-16 MED ORDER — MOMETASONE FURO-FORMOTEROL FUM 200-5 MCG/ACT IN AERO: 2.0000 | INHALATION_SPRAY | Freq: Two times a day (BID) | RESPIRATORY_TRACT | 2 refills | Status: DC

## 2017-08-16 NOTE — Progress Notes (Signed)
BP 126/66 (BP Location: Left Arm, Patient Position: Sitting, Cuff Size: Normal)   Pulse (!) 113   Temp (!) 97.5 F (36.4 C) (Other (Comment))   Ht 5\' 8"  (1.727 m)   Wt 154 lb (69.9 kg)   SpO2 98%   BMI 23.42 kg/m    Subjective:    Patient ID: Bryan Fleming, male    DOB: 17-Nov-1960, 57 y.o.   MRN: [id number]  HPI: Bryan Fleming is a 57 y.o. male presenting on 08/16/2017 for No chief complaint on file.   HPI   Pt still smokes.  He gets DOE but not but sob at rest.  He does admit to wheezing a lot lately.   Pt is still going to [facility].  He is drinking some - he says he cut back.  He says December was a hard month.  December is Anniversary of his wife passing.   Pt had tachycardia at 06/02/16 appt as well.   Pt denies sensation of palpitations, denies CP.  Says he feels fine, just a bit anxious (which is typical for him)  Relevant past medical, surgical, family and social history reviewed and updated as indicated. Interim medical history since our last visit reviewed. Allergies and medications reviewed and updated.   Current Outpatient Medications:  .  aspirin 81 MG tablet, Take 81 mg by mouth daily., Disp: , Rfl:  .  citalopram (CELEXA) 20 MG tablet, Take 20 mg by mouth daily., Disp: , Rfl:  .  loperamide (IMODIUM A-D) 2 MG tablet, Take 2 mg by mouth daily as needed for diarrhea or loose stools., Disp: , Rfl:  .  metoprolol tartrate (LOPRESSOR) 100 MG tablet, TAKE 1 Tablet  BY MOUTH TWICE DAILY, Disp: 180 tablet, Rfl: 1 .  mometasone-formoterol (DULERA) 100-5 MCG/ACT AERO, Inhale 2 puffs into the lungs 2 (two) times daily., Disp: 3 Inhaler, Rfl: 0 .  omeprazole (PRILOSEC) 40 MG capsule, Take 1 capsule (40 mg total) by mouth daily., Disp: 30 capsule, Rfl: 1 .  oxymetazoline (AFRIN) 0.05 % nasal spray, Place 2 sprays into both nostrils daily as needed for congestion., Disp: , Rfl:  .  PROVENTIL HFA 108 (90 Base) MCG/ACT inhaler, INHALE 2 PUFFS BY MOUTH EVERY 6 HOURS  AS NEEDED FOR COUGHING, WHEEZING, OR SHORTNESS OF BREATH, Disp: 20.1 g, Rfl: 1 .  sildenafil (VIAGRA) 100 MG tablet, Take 0.5-1 tablets (50-100 mg total) by mouth daily as needed for erectile dysfunction., Disp: 10 tablet, Rfl: 0 .  traZODone (DESYREL) 100 MG tablet, Take 100 mg by mouth at bedtime., Disp: , Rfl:  .  vitamin B-12 (CYANOCOBALAMIN) 500 MCG tablet, Take 500 mcg by mouth daily., Disp: , Rfl:  .  ranitidine (ZANTAC) 300 MG tablet, Take 1 tablet (300 mg total) by mouth at bedtime. (Patient not taking: Reported on 08/16/2017), Disp: 90 tablet, Rfl: 2   Review of Systems  Constitutional: Negative for appetite change, chills, diaphoresis, fatigue, fever and unexpected weight change.  HENT: Negative for congestion, dental problem, drooling, ear pain, facial swelling, hearing loss, mouth sores, sneezing, sore throat, trouble swallowing and voice change.   Eyes: Negative for pain, discharge, redness, itching and visual disturbance.  Respiratory: Positive for cough and wheezing. Negative for choking and shortness of breath.   Cardiovascular: Negative for chest pain, palpitations and leg swelling.  Gastrointestinal: Negative for abdominal pain, blood in stool, constipation, diarrhea and vomiting.  Endocrine: Negative for cold intolerance, heat intolerance and polydipsia.  Genitourinary: Negative for decreased urine volume,  dysuria and hematuria.  Musculoskeletal: Positive for arthralgias and back pain. Negative for gait problem.  Skin: Negative for rash.  Allergic/Immunologic: Negative for environmental allergies.  Neurological: Negative for seizures, syncope, light-headedness and headaches.  Hematological: Negative for adenopathy.  Psychiatric/Behavioral: Negative for agitation, dysphoric mood and suicidal ideas. The patient is not nervous/anxious.     Per HPI unless specifically indicated above     Objective:    BP 126/66 (BP Location: Left Arm, Patient Position: Sitting, Cuff Size:  Normal)   Pulse (!) 113   Temp (!) 97.5 F (36.4 C) (Other (Comment))   Ht 5\' 8"  (1.727 m)   Wt 154 lb (69.9 kg)   SpO2 98%   BMI 23.42 kg/m   Wt Readings from Last 3 Encounters:  08/16/17 154 lb (69.9 kg)  04/05/17 161 lb 8 oz (73.3 kg)  03/21/17 163 lb 12 oz (74.3 kg)    Physical Exam  Constitutional: He is oriented to person, place, and time. He appears well-developed and well-nourished.  HENT:  Head: Normocephalic and atraumatic.  Neck: Neck supple.  Cardiovascular: Regular rhythm and intact distal pulses. Tachycardia present.  Pulmonary/Chest: No tachypnea and no bradypnea. No respiratory distress. He has wheezes. He has no rhonchi. He has no rales.  After nebulizer treatment, increased air flow and decreased wheezes  Abdominal: Soft. Bowel sounds are normal. There is no hepatosplenomegaly. There is no tenderness.  Musculoskeletal: He exhibits no edema.  Lymphadenopathy:    He has no cervical adenopathy.  Neurological: He is alert and oriented to person, place, and time.  Skin: Skin is warm and dry.  Psychiatric: He has a normal mood and affect. His behavior is normal.  Vitals reviewed.  EKD- sinus tachycardia.  No changes compared with EKG from 05/2016     Assessment & Plan:    Encounter Diagnoses  Name Primary?  . Hypertension, unspecified type Yes  . Tachycardia   . Chronic obstructive pulmonary disease, unspecified COPD type ([place])   . Wheezing   . Alcohol abuse   . Anxiety   . Depression, unspecified depression type   . Elevated liver function tests   . Screening for colon cancer     -ifobt was given to pt for GI bleed/ colon cancer screening in light of his tachycardia (he had colonoscopy in 2013) -pt to get Labs today -will Increase dulera to 200 -counseled smoking cessation -encouraged pt to discuss his anxiety and depression as well as his alcohol use with his counselor soon -pt to follow up in one month for recheck.  RTO sooner prn worsening or new  symptoms

## 2017-08-18 ENCOUNTER — Other Ambulatory Visit: Payer: Self-pay | Admitting: Physician Assistant

## 2017-08-18 MED ORDER — OMEPRAZOLE 40 MG PO CPDR: 40.0000 mg | DELAYED_RELEASE_CAPSULE | Freq: Every day | ORAL | 1 refills | Status: DC

## 2017-08-22 LAB — IFOBT (OCCULT BLOOD): IFOBT: NEGATIVE

## 2017-08-23 ENCOUNTER — Telehealth: Payer: Self-pay | Admitting: Student

## 2017-08-23 NOTE — Telephone Encounter (Signed)
Called and notified pt that only omeprazole was refilled and not ranitidine since they both treat the same condition. Pt verbalized understanding.

## 2017-08-23 NOTE — Telephone Encounter (Signed)
-----   Message from Soyla Dryer, [place] sent at 08/18/2017  2:09 PM EST ----- Please let pt know that his omeprazole was refilled and his ranitidine was not. He does not need both as they treat the same condition.

## 2017-09-07 ENCOUNTER — Other Ambulatory Visit (HOSPITAL_COMMUNITY)
Admission: RE | Admit: 2017-09-07 | Discharge: 2017-09-07 | Disposition: A | Discharge status: Home / Self Care | Payer: Self-pay | Source: Ambulatory Visit | Attending: Physician Assistant | Admitting: Physician Assistant

## 2017-09-07 ENCOUNTER — Other Ambulatory Visit: Payer: Self-pay | Admitting: Physician Assistant

## 2017-09-07 DIAGNOSIS — I10 Essential (primary) hypertension: Secondary | ICD-10-CM

## 2017-09-07 DIAGNOSIS — E876 Hypokalemia: Secondary | ICD-10-CM | POA: Insufficient documentation

## 2017-09-07 DIAGNOSIS — D649 Anemia, unspecified: Secondary | ICD-10-CM

## 2017-09-07 LAB — HEMOGLOBIN AND HEMATOCRIT, BLOOD
HEMATOCRIT: 42.2 % (ref 39.0–52.0)
Hemoglobin: 13.6 g/dL (ref 13.0–17.0)

## 2017-09-07 LAB — POTASSIUM: Potassium: 3.8 mmol/L (ref 3.5–5.1)

## 2017-09-27 ENCOUNTER — Ambulatory Visit: Payer: Self-pay | Admitting: Physician Assistant

## 2017-10-02 ENCOUNTER — Ambulatory Visit: Payer: Self-pay | Admitting: Physician Assistant

## 2017-10-02 ENCOUNTER — Encounter: Payer: Self-pay | Admitting: Physician Assistant

## 2017-10-02 VITALS — BP 101/63 | HR 86 | Temp 98.1°F | Ht 68.0 in | Wt 149.0 lb

## 2017-10-02 DIAGNOSIS — R000 Tachycardia, unspecified: Secondary | ICD-10-CM

## 2017-10-02 DIAGNOSIS — F17218 Nicotine dependence, cigarettes, with other nicotine-induced disorders: Secondary | ICD-10-CM

## 2017-10-02 DIAGNOSIS — I10 Essential (primary) hypertension: Secondary | ICD-10-CM

## 2017-10-02 DIAGNOSIS — F1010 Alcohol abuse, uncomplicated: Secondary | ICD-10-CM

## 2017-10-02 DIAGNOSIS — J449 Chronic obstructive pulmonary disease, unspecified: Secondary | ICD-10-CM

## 2017-10-02 DIAGNOSIS — R062 Wheezing: Secondary | ICD-10-CM

## 2017-10-02 DIAGNOSIS — F32A Depression, unspecified: Secondary | ICD-10-CM

## 2017-10-02 DIAGNOSIS — F329 Major depressive disorder, single episode, unspecified: Secondary | ICD-10-CM

## 2017-10-02 DIAGNOSIS — F419 Anxiety disorder, unspecified: Secondary | ICD-10-CM

## 2017-10-02 MED ORDER — IPRATROPIUM-ALBUTEROL 0.5-2.5 (3) MG/3ML IN SOLN
3.0000 mL | Freq: Once | RESPIRATORY_TRACT | Status: AC
  Administered 2017-10-02: 3 mL via RESPIRATORY_TRACT

## 2017-10-02 NOTE — Progress Notes (Signed)
BP 101/63 (BP Location: Right Arm, Patient Position: Sitting, Cuff Size: Normal)   Pulse 86   Temp 98.1 F (36.7 C)   Ht 5\' 8"  (1.727 m)   Wt 149 lb (67.6 kg)   SpO2 95%   BMI 22.66 kg/m    Subjective:    Patient ID: Bryan Fleming, male    DOB: 09/06/1960, 57 y.o.   MRN: [id number]  HPI: RORAN Bryan Fleming is a 57 y.o. male presenting on 10/02/2017 for Follow-up   HPI   Pt is still going to [facility] for depression and substance abuse  He is still drinking  He is still smoking  He says his breathing is about the same.  Relevant past medical, surgical, family and social history reviewed and updated as indicated. Interim medical history since our last visit reviewed. Allergies and medications reviewed and updated.   Current Outpatient Medications:  .  aspirin 81 MG tablet, Take 81 mg by mouth daily., Disp: , Rfl:  .  citalopram (CELEXA) 20 MG tablet, Take 20 mg by mouth daily., Disp: , Rfl:  .  diphenhydrAMINE (BENADRYL) 25 MG tablet, Take 25 mg by mouth every 6 (six) hours as needed., Disp: , Rfl:  .  loperamide (IMODIUM A-D) 2 MG tablet, Take 2 mg by mouth daily as needed for diarrhea or loose stools., Disp: , Rfl:  .  metoprolol tartrate (LOPRESSOR) 100 MG tablet, TAKE 1 Tablet  BY MOUTH TWICE DAILY, Disp: 180 tablet, Rfl: 1 .  mometasone-formoterol (DULERA) 200-5 MCG/ACT AERO, Inhale 2 puffs into the lungs 2 (two) times daily., Disp: 3 Inhaler, Rfl: 2 .  omeprazole (PRILOSEC) 40 MG capsule, Take 1 capsule (40 mg total) by mouth daily., Disp: 90 capsule, Rfl: 1 .  oxymetazoline (AFRIN) 0.05 % nasal spray, Place 2 sprays into both nostrils daily as needed for congestion., Disp: , Rfl:  .  PROVENTIL HFA 108 (90 Base) MCG/ACT inhaler, INHALE 2 PUFFS BY MOUTH EVERY 6 HOURS AS NEEDED FOR COUGHING, WHEEZING, OR SHORTNESS OF BREATH, Disp: 20.1 g, Rfl: 1 .  sildenafil (VIAGRA) 100 MG tablet, Take 0.5-1 tablets (50-100 mg total) by mouth daily as needed for erectile  dysfunction., Disp: 10 tablet, Rfl: 0 .  tetrahydrozoline 0.05 % ophthalmic solution, Place 1 drop into both eyes as needed., Disp: , Rfl:  .  traZODone (DESYREL) 100 MG tablet, Take 200 mg by mouth at bedtime. , Disp: , Rfl:  .  vitamin B-12 (CYANOCOBALAMIN) 500 MCG tablet, Take 500 mcg by mouth daily., Disp: , Rfl:    Review of Systems  Constitutional: Negative for appetite change, chills, diaphoresis, fatigue, fever and unexpected weight change.  HENT: Negative for congestion, dental problem, drooling, ear pain, facial swelling, hearing loss, mouth sores, sneezing, sore throat, trouble swallowing and voice change.   Eyes: Negative for pain, discharge, redness, itching and visual disturbance.  Respiratory: Positive for cough, shortness of breath and wheezing. Negative for choking.   Cardiovascular: Negative for chest pain, palpitations and leg swelling.  Gastrointestinal: Negative for abdominal pain, blood in stool, constipation, diarrhea and vomiting.  Endocrine: Negative for cold intolerance, heat intolerance and polydipsia.  Genitourinary: Negative for decreased urine volume, dysuria and hematuria.  Musculoskeletal: Positive for back pain. Negative for arthralgias and gait problem.  Skin: Negative for rash.  Allergic/Immunologic: Negative for environmental allergies.  Neurological: Negative for seizures, syncope, light-headedness and headaches.  Hematological: Negative for adenopathy.  Psychiatric/Behavioral: Negative for agitation, dysphoric mood and suicidal ideas. The patient is not nervous/anxious.  Per HPI unless specifically indicated above     Objective:    BP 101/63 (BP Location: Right Arm, Patient Position: Sitting, Cuff Size: Normal)   Pulse 86   Temp 98.1 F (36.7 C)   Ht 5\' 8"  (1.727 m)   Wt 149 lb (67.6 kg)   SpO2 95%   BMI 22.66 kg/m   Wt Readings from Last 3 Encounters:  10/02/17 149 lb (67.6 kg)  08/16/17 154 lb (69.9 kg)  04/05/17 161 lb 8 oz (73.3 kg)     Physical Exam  Constitutional: He is oriented to person, place, and time. He appears well-developed and well-nourished.  HENT:  Head: Normocephalic and atraumatic.  Neck: Neck supple.  Cardiovascular: Normal rate and regular rhythm.  Pulmonary/Chest: Effort normal. No respiratory distress. He has wheezes. He has no rales.  After nebulizer treatment, wheezes decreased and air flow improved  Abdominal: Soft. Bowel sounds are normal. There is no hepatosplenomegaly. There is no tenderness.  Musculoskeletal: He exhibits no edema.  Lymphadenopathy:    He has no cervical adenopathy.  Neurological: He is alert and oriented to person, place, and time.  Skin: Skin is warm and dry.  Psychiatric: He has a normal mood and affect. His behavior is normal.  Vitals reviewed.   Results for orders placed or performed during the hospital encounter of 09/07/17  Hemoglobin and hematocrit, blood  Result Value Ref Range   Hemoglobin 13.6 13.0 - 17.0 g/dL   HCT 42.2 39.0 - 52.0 %  Potassium  Result Value Ref Range   Potassium 3.8 3.5 - 5.1 mmol/L      Assessment & Plan:    Encounter Diagnoses  Name Primary?  . Chronic obstructive pulmonary disease, unspecified COPD type ([place]) Yes  . Nicotine dependence, cigarettes, with other nicotine-induced disorders   . Wheezing   . Hypertension, unspecified type   . Tachycardia   . Alcohol abuse   . Anxiety   . Depression, unspecified depression type     -Pt counseled on smoking cessation.  Discussed that his breathing will continue to worsen, despite treatment, if he continues to smoke -pt to continue to go to [facility] for [place] and substance abuse -pulse good today. Continue beta blocker although may need to revisit if breathing worsens -pt to follow up in 3 months.  He is to RTO sooner if he worsens or has problems.

## 2017-10-02 NOTE — Patient Instructions (Signed)

## 2017-11-13 ENCOUNTER — Other Ambulatory Visit: Payer: Self-pay | Admitting: Physician Assistant

## 2017-12-16 ENCOUNTER — Other Ambulatory Visit: Payer: Self-pay

## 2017-12-16 ENCOUNTER — Encounter (HOSPITAL_COMMUNITY): Payer: Self-pay

## 2017-12-16 ENCOUNTER — Emergency Department (HOSPITAL_COMMUNITY)
Admission: EM | Admit: 2017-12-16 | Discharge: 2017-12-16 | Disposition: A | Discharge status: Home / Self Care | Payer: Self-pay | Attending: Emergency Medicine | Admitting: Emergency Medicine

## 2017-12-16 ENCOUNTER — Emergency Department (HOSPITAL_COMMUNITY): Payer: Self-pay

## 2017-12-16 DIAGNOSIS — R112 Nausea with vomiting, unspecified: Secondary | ICD-10-CM

## 2017-12-16 DIAGNOSIS — Z79899 Other long term (current) drug therapy: Secondary | ICD-10-CM | POA: Insufficient documentation

## 2017-12-16 DIAGNOSIS — Z7982 Long term (current) use of aspirin: Secondary | ICD-10-CM | POA: Insufficient documentation

## 2017-12-16 DIAGNOSIS — F17210 Nicotine dependence, cigarettes, uncomplicated: Secondary | ICD-10-CM | POA: Insufficient documentation

## 2017-12-16 DIAGNOSIS — I10 Essential (primary) hypertension: Secondary | ICD-10-CM | POA: Insufficient documentation

## 2017-12-16 DIAGNOSIS — K219 Gastro-esophageal reflux disease without esophagitis: Secondary | ICD-10-CM | POA: Insufficient documentation

## 2017-12-16 DIAGNOSIS — J449 Chronic obstructive pulmonary disease, unspecified: Secondary | ICD-10-CM | POA: Insufficient documentation

## 2017-12-16 LAB — URINALYSIS, ROUTINE W REFLEX MICROSCOPIC
Glucose, UA: 50 mg/dL — AB
HGB URINE DIPSTICK: NEGATIVE
KETONES UR: 20 mg/dL — AB
LEUKOCYTES UA: NEGATIVE
Nitrite: NEGATIVE
PH: 7 (ref 5.0–8.0)
SPECIFIC GRAVITY, URINE: 1.031 — AB (ref 1.005–1.030)

## 2017-12-16 LAB — COMPREHENSIVE METABOLIC PANEL
ALT: 39 U/L (ref 17–63)
AST: 57 U/L — ABNORMAL HIGH (ref 15–41)
Albumin: 4.7 g/dL (ref 3.5–5.0)
Alkaline Phosphatase: 72 U/L (ref 38–126)
Anion gap: 14 (ref 5–15)
BILIRUBIN TOTAL: 1.4 mg/dL — AB (ref 0.3–1.2)
BUN: 8 mg/dL (ref 6–20)
CO2: 33 mmol/L — ABNORMAL HIGH (ref 22–32)
Calcium: 10.1 mg/dL (ref 8.9–10.3)
Chloride: 91 mmol/L — ABNORMAL LOW (ref 101–111)
Creatinine, Ser: 0.72 mg/dL (ref 0.61–1.24)
Glucose, Bld: 145 mg/dL — ABNORMAL HIGH (ref 65–99)
POTASSIUM: 3.8 mmol/L (ref 3.5–5.1)
Sodium: 138 mmol/L (ref 135–145)
TOTAL PROTEIN: 9.1 g/dL — AB (ref 6.5–8.1)

## 2017-12-16 LAB — CBC WITH DIFFERENTIAL/PLATELET
BASOS ABS: 0 10*3/uL (ref 0.0–0.1)
Basophils Relative: 0 %
EOS PCT: 0 %
Eosinophils Absolute: 0 10*3/uL (ref 0.0–0.7)
HCT: 45.4 % (ref 39.0–52.0)
Hemoglobin: 14.8 g/dL (ref 13.0–17.0)
LYMPHS PCT: 19 %
Lymphs Abs: 1.8 10*3/uL (ref 0.7–4.0)
MCH: 28.7 pg (ref 26.0–34.0)
MCHC: 32.6 g/dL (ref 30.0–36.0)
MCV: 88.2 fL (ref 78.0–100.0)
Monocytes Absolute: 1.3 10*3/uL — ABNORMAL HIGH (ref 0.1–1.0)
Monocytes Relative: 14 %
NEUTROS PCT: 67 %
Neutro Abs: 6.6 10*3/uL (ref 1.7–7.7)
PLATELETS: 206 10*3/uL (ref 150–400)
RBC: 5.15 MIL/uL (ref 4.22–5.81)
RDW: 18 % — ABNORMAL HIGH (ref 11.5–15.5)
WBC: 9.8 10*3/uL (ref 4.0–10.5)

## 2017-12-16 LAB — TROPONIN I

## 2017-12-16 LAB — LIPASE, BLOOD: Lipase: 95 U/L — ABNORMAL HIGH (ref 11–51)

## 2017-12-16 IMAGING — DX DG CHEST 2V
2 series · 2 of 2 positions shown · non-contrast
Comparison: [DATE]

CLINICAL DATA: Chest pain, nausea, acid reflux for several days

EXAM:
CHEST - 2 VIEW

[chest pa]
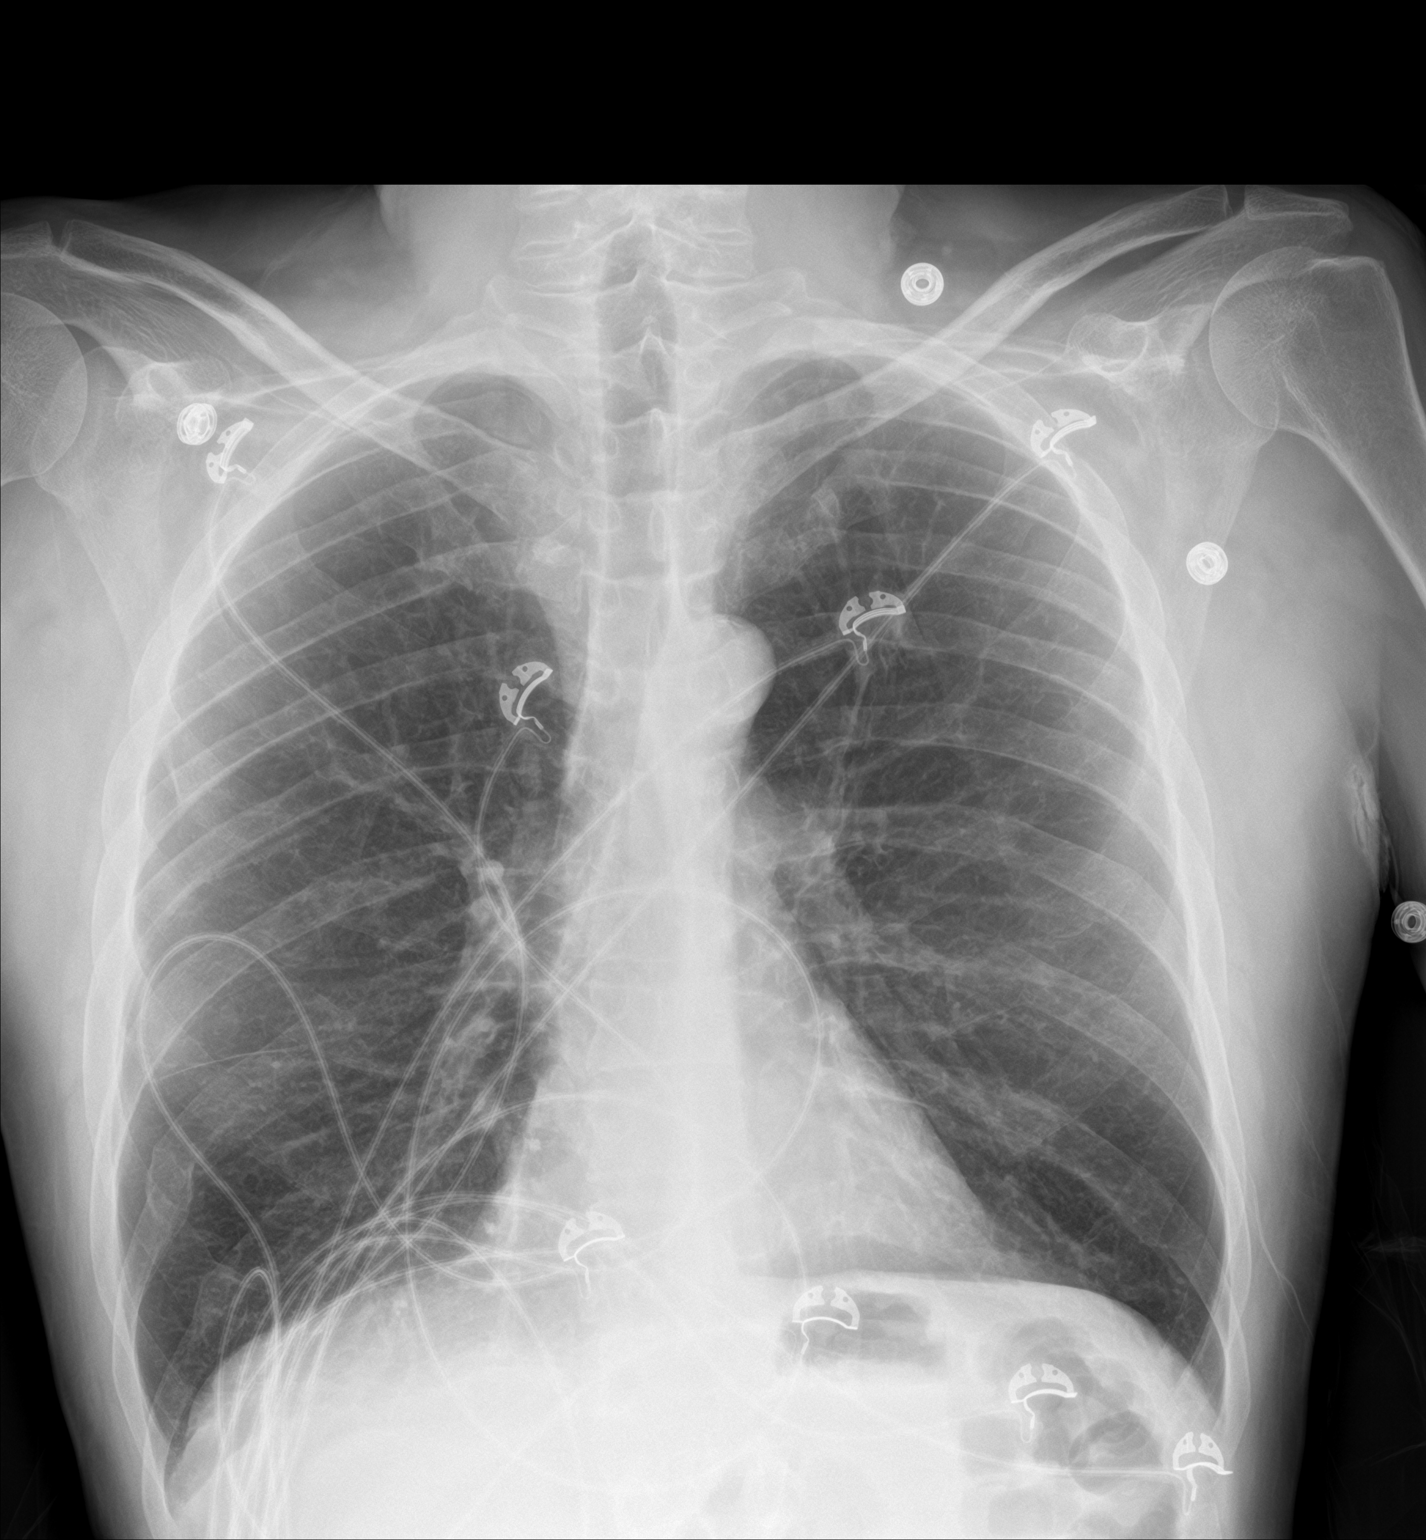

[chest lat]
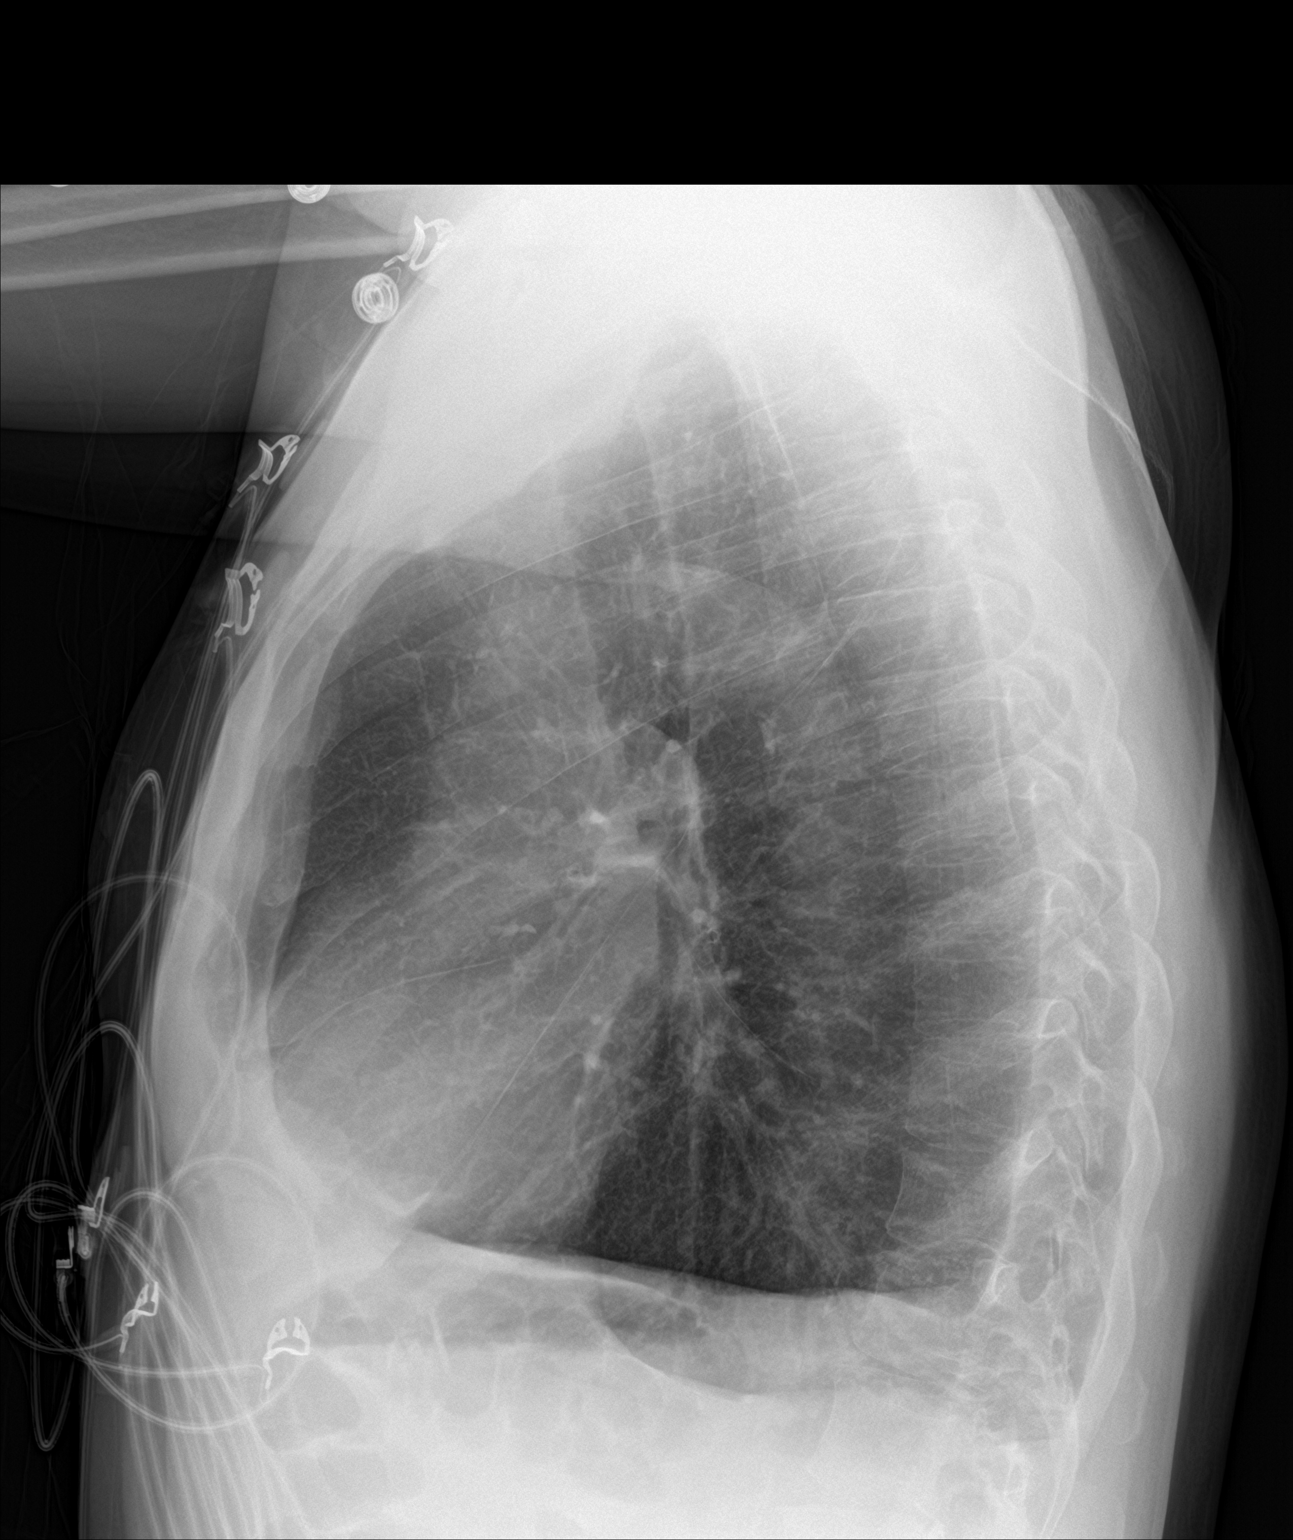

[2 of 2 positions shown; findings below may reference images not displayed]

FINDINGS: There is no focal parenchymal opacity. There is no pleural effusion
or pneumothorax. The heart and mediastinal contours are
unremarkable.

There is no acute osseous abnormality. There are old healed right
lower posterior rib fractures.
IMPRESSION: No active cardiopulmonary disease.

## 2017-12-16 MED ORDER — FAMOTIDINE IN NACL 20-0.9 MG/50ML-% IV SOLN
20.0000 mg | INTRAVENOUS | Status: AC
  Administered 2017-12-16: 20 mg via INTRAVENOUS
  Filled 2017-12-16: qty 50

## 2017-12-16 MED ORDER — SODIUM CHLORIDE 0.9 % IV BOLUS
1000.0000 mL | Freq: Once | INTRAVENOUS | Status: AC
Start: 2017-12-16 — End: 2017-12-16
  Administered 2017-12-16: 1000 mL via INTRAVENOUS

## 2017-12-16 MED ORDER — ONDANSETRON HCL 4 MG/2ML IJ SOLN
4.0000 mg | Freq: Once | INTRAMUSCULAR | Status: AC
  Administered 2017-12-16: 4 mg via INTRAVENOUS
  Filled 2017-12-16: qty 2

## 2017-12-16 MED ORDER — GI COCKTAIL ~~LOC~~
30.0000 mL | Freq: Once | ORAL | Status: AC
  Administered 2017-12-16: 30 mL via ORAL
  Filled 2017-12-16: qty 30

## 2017-12-16 MED ORDER — ONDANSETRON 4 MG PO TBDP: 4.0000 mg | ORAL_TABLET | Freq: Three times a day (TID) | ORAL | 0 refills | Status: DC | PRN

## 2017-12-16 MED ORDER — PANTOPRAZOLE SODIUM 20 MG PO TBEC: 20.0000 mg | DELAYED_RELEASE_TABLET | Freq: Every day | ORAL | 1 refills | Status: DC

## 2017-12-16 NOTE — ED Triage Notes (Signed)
Pt reports vomiting and chest pressure after eating a hot dog 2 days ago.  Pt says has acid reflux.  Pt says chest pressure and abd pain started after he started vomiting.

## 2017-12-16 NOTE — ED Notes (Signed)
Pt states he is feeling much better.  No further vomiting noted.

## 2017-12-16 NOTE — ED Provider Notes (Signed)
[facility] EMERGENCY DEPARTMENT Provider Note   CSN: [id number] Arrival date & time: 12/16/17  0841     History   Chief Complaint Chief Complaint  Patient presents with  . Chest Pain  . Emesis    HPI Bryan Fleming is a 57 y.o. male.  HPI  The patient is a 57 year old male, he has a history of alcoholism, he has had history of high blood pressure and COPD.  He states that he is good about taking his medications but unfortunately still drinks approximately half a pint of liquor a day.  He reports that over the last 2 days he has had ongoing and severe nausea chest discomfort and persistent vomiting.  He reports the chest discomfort started after he had eaten a hot dog with "all the fixins".  He reports this is similar to his acid reflux.  The chest discomfort is worse with vomiting.  He denies any fevers chills coughing or shortness of breath.  He states that nothing seems to make this better, he has tried to take his ranitidine which he is prescribed which has not helped.  He denies any swelling of the legs, denies any history of pancreatitis, has never had a stomach ulcer as far as he knows.  At this time his symptoms are severe.  Past Medical History:  Diagnosis Date  . COPD (chronic obstructive pulmonary disease) ([place]) 2007   EMPHYSEMA  . GERD (gastroesophageal reflux disease) 2010  . Hepatitis    Alcoholic   . Hypertension     Patient Active Problem List   Diagnosis Date Noted  . Elevated liver function tests 02/02/2016  . 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 Alcohol abuse 08/30/2015  . Anxiety state 08/30/2015  . Nicotine dependence, cigarettes, with other nicotine-induced disorders 08/30/2015  . Hepatitis 08/31/2011  . Hypertension   . GERD (gastroesophageal reflux disease)   . COPD (chronic obstructive pulmonary disease) ([place])     Past Surgical History:  Procedure Laterality Date  . CARDIAC CATHETERIZATION     4-5 YEARS AGO  . COLONOSCOPY  03/14/2012   Procedure: COLONOSCOPY;  Surgeon: Rogene Houston, MD;  Location: [facility]ITE;  Service: Endoscopy;  Laterality: N/A;  100  . WRIST GANGLION EXCISION  1985        Home Medications    Prior to Admission medications   Medication Sig Start Date End Date Taking? Authorizing Provider  aspirin 81 MG tablet Take 81 mg by mouth daily.   Yes [provider]  citalopram (CELEXA) 20 MG tablet Take 20 mg by mouth daily.   Yes [provider]  diphenhydrAMINE (BENADRYL) 25 MG tablet Take 25 mg by mouth every 6 (six) hours as needed.   Yes [provider]  loperamide (IMODIUM A-D) 2 MG tablet Take 2 mg by mouth daily as needed for diarrhea or loose stools.   Yes [provider]  metoprolol tartrate (LOPRESSOR) 100 MG tablet TAKE 1 Tablet  BY MOUTH TWICE DAILY 11/13/17  Yes Soyla Dryer, PA-C  mometasone-formoterol (DULERA) 200-5 MCG/ACT AERO Inhale 2 puffs into the lungs 2 (two) times daily. 08/16/17  Yes Soyla Dryer, PA-C  oxymetazoline (AFRIN) 0.05 % nasal spray Place 2 sprays into both nostrils daily as needed for congestion.   Yes [provider]  PROVENTIL HFA 108 (90 Base) MCG/ACT inhaler INHALE 2 PUFFS BY MOUTH EVERY 6 HOURS AS NEEDED FOR COUGHING, WHEEZING, OR SHORTNESS OF BREATH 11/13/17  Yes Soyla Dryer, PA-C  ranitidine (ZANTAC) 150 MG  tablet Take 300 mg by mouth at bedtime.    Yes [provider]  sildenafil (VIAGRA) 100 MG tablet Take 0.5-1 tablets (50-100 mg total) by mouth daily as needed for erectile dysfunction. 05/16/17  Yes Soyla Dryer, PA-C  tetrahydrozoline 0.05 % ophthalmic solution Place 1 drop into both eyes as needed.   Yes [provider]  traZODone (DESYREL) 100 MG tablet Take 200 mg by mouth at bedtime.    Yes [provider]  vitamin B-12 (CYANOCOBALAMIN) 500 MCG tablet Take 500 mcg by mouth daily.   Yes [provider]  omeprazole (PRILOSEC) 40 MG capsule Take 1 capsule (40 mg total)  by mouth daily. 08/18/17   Soyla Dryer, PA-C  ondansetron (ZOFRAN ODT) 4 MG disintegrating tablet Take 1 tablet (4 mg total) by mouth every 8 (eight) hours as needed for nausea. 12/16/17   Noemi Chapel, MD  pantoprazole (PROTONIX) 20 MG tablet Take 1 tablet (20 mg total) by mouth daily. 12/16/17 01/15/18  Noemi Chapel, MD    Family History Family History  Problem Relation Age of Onset  . Hypertension Mother   . Asthma Father   . Diabetes Sister   . Hypertension Sister   . Hypertension Brother   . Hypertension Sister   . Lupus Daughter     Social History Social History   Tobacco Use  . Smoking status: Current Every Day Smoker    Packs/day: 1.00    Years: 37.00    Pack years: 37.00    Types: Cigarettes  . Smokeless tobacco: Never Used  Substance Use Topics  . Alcohol use: Yes    Alcohol/week: 13.2 oz    Types: 21 Cans of beer, 1 Standard drinks or equivalent per week    Comment: daily, last etoh yesterday around noon  . Drug use: No     Allergies   Patient has no known allergies.   Review of Systems Review of Systems  All other systems reviewed and are negative.    Physical Exam Updated Vital Signs BP (!) 187/91   Pulse 81   Temp 99.2 F (37.3 C) (Oral)   Resp 14   Ht 5\' 9"  (1.753 m)   Wt 65.8 kg (145 lb)   SpO2 96%   BMI 21.41 kg/m   Physical Exam  Constitutional: He appears well-developed and well-nourished.  Frequent episodes of vomiting  HENT:  Head: Normocephalic and atraumatic.  Mouth/Throat: Oropharynx is clear and moist. No oropharyngeal exudate.  Eyes: Pupils are equal, round, and reactive to light. Conjunctivae and EOM are normal. Right eye exhibits no discharge. Left eye exhibits no discharge. No scleral icterus.  No jaundice  Neck: Normal range of motion. Neck supple. No JVD present. No thyromegaly present.  Cardiovascular: Regular rhythm, normal heart sounds and intact distal pulses. Exam reveals no gallop and no friction rub.  No  murmur heard. Pulse of 100 on exam, normal pulses at the radial arteries, no edema, no JVD  Pulmonary/Chest: Effort normal and breath sounds normal. No respiratory distress. He has no wheezes. He has no rales.  Lungs are clear, no tenderness over the chest wall  Abdominal: Soft. Bowel sounds are normal. He exhibits no distension and no mass. There is tenderness.  Mild epigastric tenderness, no guarding or peritoneal signs  Musculoskeletal: Normal range of motion. He exhibits no edema or tenderness.  Lymphadenopathy:    He has no cervical adenopathy.  Neurological: He is alert. Coordination normal.  Skin: Skin is warm and dry. No rash  noted. No erythema.  Psychiatric: He has a normal mood and affect. His behavior is normal.  Nursing note and vitals reviewed.    ED Treatments / Results  Labs (all labs ordered are listed, but only abnormal results are displayed) Labs Reviewed  LIPASE, BLOOD - Abnormal; Notable for the following components:      Result Value   Lipase 95 (*)    All other components within normal limits  COMPREHENSIVE METABOLIC PANEL - Abnormal; Notable for the following components:   Chloride 91 (*)    CO2 33 (*)    Glucose, Bld 145 (*)    Total Protein 9.1 (*)    AST 57 (*)    Total Bilirubin 1.4 (*)    All other components within normal limits  CBC WITH DIFFERENTIAL/PLATELET - Abnormal; Notable for the following components:   RDW 18.0 (*)    Monocytes Absolute 1.3 (*)    All other components within normal limits  URINALYSIS, ROUTINE W REFLEX MICROSCOPIC - Abnormal; Notable for the following components:   Color, Urine AMBER (*)    APPearance HAZY (*)    Specific Gravity, Urine 1.031 (*)    Glucose, UA 50 (*)    Bilirubin Urine MODERATE (*)    Ketones, ur 20 (*)    Protein, ur >=300 (*)    Bacteria, UA RARE (*)    All other components within normal limits  TROPONIN I    EKG EKG Interpretation  Date/Time:  Saturday Dec 16 2017 08:53:33 EDT Ventricular  Rate:  102 PR Interval:    QRS Duration: 79 QT Interval:  370 QTC Calculation: 482 R Axis:   78 Text Interpretation:  Sinus tachycardia Anteroseptal infarct, age indeterminate Poor R wave progression Since last tracing Poor R wave progression now seen. Confirmed by Noemi Chapel [phone]) on 12/16/2017 8:57:59 AM   Radiology Dg Chest 2 View  Result Date: 12/16/2017 CLINICAL DATA:  Chest pain, nausea, acid reflux for several days EXAM: CHEST - 2 VIEW COMPARISON:  06/16/2016 FINDINGS: There is no focal parenchymal opacity. There is no pleural effusion or pneumothorax. The heart and mediastinal contours are unremarkable. There is no acute osseous abnormality. There are old healed right lower posterior rib fractures. IMPRESSION: No active cardiopulmonary disease. Electronically Signed   By: Kathreen Devoid   On: 12/16/2017 10:16    Procedures Procedures (including critical care time)  Medications Ordered in ED Medications  gi cocktail (Maalox,Lidocaine,Donnatal) (30 mLs Oral Given 12/16/17 0911)  famotidine (PEPCID) IVPB 20 mg premix (0 mg Intravenous Stopped 12/16/17 0928)  ondansetron (ZOFRAN) injection 4 mg (4 mg Intravenous Given 12/16/17 0911)  sodium chloride 0.9 % bolus 1,000 mL (0 mLs Intravenous Stopped 12/16/17 1139)     Initial Impression / Assessment and Plan / ED Course  I have reviewed the triage vital signs and the nursing notes.  Pertinent labs & imaging results that were available during my care of the patient were reviewed by me and considered in my medical decision making (see chart for details).  Clinical Course as of Dec 17 1139  Sat Dec 16, 2017  1020 Chest x-ray is negative for acute findings, blood counts are normal, metabolic panel shows that the patient has normal electrolytes shy of a slightly low chloride, LFTs are slightly abnormal as is the bilirubin which is what I would expect with the patient's underlying liver disease.  His lipase was elevated at 95 but this is  not in the diagnostic range, his urinalysis showed ketones,  protein and glucose and his troponin was normal.  This makes cardiac disease much less likely, it makes a gastritis more likely.  IV fluids have been given for his dehydration.  The patient is feeling better, rechecked at 1020, less symptomatic, feeling better   [BM]    Clinical Course User Index [BM] Noemi Chapel, MD   Overall the EKG is unremarkable, labs will be ordered to include a troponin, metabolic panel with liver function and lipase.  Differential diagnosis would include peptic ulcer disease, alcoholic gastritis, acid reflux, acute coronary syndrome although this latter diagnosis being less likely.  Final Clinical Impressions(s) / ED Diagnoses   Final diagnoses:  Gastroesophageal reflux disease without esophagitis  Non-intractable vomiting with nausea, unspecified vomiting type    ED Discharge Orders        Ordered    pantoprazole (PROTONIX) 20 MG tablet  Daily     12/16/17 1140    ondansetron (ZOFRAN ODT) 4 MG disintegrating tablet  Every 8 hours PRN     12/16/17 1140       Noemi Chapel, MD 12/16/17 1141

## 2017-12-16 NOTE — Discharge Instructions (Signed)
Your testing today has not shown a definite cause of your symptoms.  Your symptoms are likely related to significant acid reflux which may have been triggered by a combination of foods that you had eaten as well as the alcohol use.  Please reduce the amount of alcohol that you drink  Please continue ranitidine, take this twice a day for 2 weeks then back to once a day  Please take Protonix daily for the next 30 days  You should see your doctor within the next week for recheck but return to the emergency department for increasing chest pain or worsening symptoms.

## 2017-12-28 ENCOUNTER — Encounter: Payer: Self-pay | Admitting: Physician Assistant

## 2017-12-28 ENCOUNTER — Ambulatory Visit: Payer: Self-pay | Admitting: Physician Assistant

## 2017-12-28 VITALS — BP 126/76 | HR 81 | Temp 97.3°F | Ht 68.0 in | Wt 143.5 lb

## 2017-12-28 DIAGNOSIS — I10 Essential (primary) hypertension: Secondary | ICD-10-CM

## 2017-12-28 DIAGNOSIS — F32A Depression, unspecified: Secondary | ICD-10-CM

## 2017-12-28 DIAGNOSIS — J449 Chronic obstructive pulmonary disease, unspecified: Secondary | ICD-10-CM

## 2017-12-28 DIAGNOSIS — F17218 Nicotine dependence, cigarettes, with other nicotine-induced disorders: Secondary | ICD-10-CM

## 2017-12-28 DIAGNOSIS — F419 Anxiety disorder, unspecified: Secondary | ICD-10-CM

## 2017-12-28 DIAGNOSIS — F329 Major depressive disorder, single episode, unspecified: Secondary | ICD-10-CM

## 2017-12-28 DIAGNOSIS — F1010 Alcohol abuse, uncomplicated: Secondary | ICD-10-CM

## 2017-12-28 MED ORDER — SILDENAFIL CITRATE 100 MG PO TABS: 50.0000 mg | ORAL_TABLET | Freq: Every day | ORAL | 0 refills | Status: DC | PRN

## 2017-12-28 NOTE — Progress Notes (Signed)
BP 126/76 (BP Location: Right Arm, Patient Position: Sitting, Cuff Size: Normal)   Pulse 81   Temp (!) 97.3 F (36.3 C) (Oral)   Ht 5\' 8"  (1.727 m)   Wt 143 lb 8 oz (65.1 kg)   SpO2 94%   BMI 21.82 kg/m    Subjective:    Patient ID: Bryan Fleming, male    DOB: 1961-06-13, 57 y.o.   MRN: [id number]  HPI: MANOJ ENRIQUEZ is a 57 y.o. male presenting on 12/28/2017 for Follow-up   HPI   Pt is still going to [facility].  He is still drinking.  He is still smoking.  He isn't interested in stopping any of these behaviors.  He says the only thing he wants is a refill of his viagra.   Relevant past medical, surgical, family and social history reviewed and updated as indicated. Interim medical history since our last visit reviewed. Allergies and medications reviewed and updated.   Current Outpatient Medications:  .  aspirin 81 MG tablet, Take 81 mg by mouth daily., Disp: , Rfl:  .  citalopram (CELEXA) 20 MG tablet, Take 20 mg by mouth daily., Disp: , Rfl:  .  diphenhydrAMINE (BENADRYL) 25 MG tablet, Take 25 mg by mouth every 6 (six) hours as needed., Disp: , Rfl:  .  loperamide (IMODIUM A-D) 2 MG tablet, Take 2 mg by mouth daily as needed for diarrhea or loose stools., Disp: , Rfl:  .  metoprolol tartrate (LOPRESSOR) 100 MG tablet, TAKE 1 Tablet  BY MOUTH TWICE DAILY, Disp: 180 tablet, Rfl: 0 .  mometasone-formoterol (DULERA) 200-5 MCG/ACT AERO, Inhale 2 puffs into the lungs 2 (two) times daily., Disp: 3 Inhaler, Rfl: 2 .  ondansetron (ZOFRAN ODT) 4 MG disintegrating tablet, Take 1 tablet (4 mg total) by mouth every 8 (eight) hours as needed for nausea., Disp: 10 tablet, Rfl: 0 .  oxymetazoline (AFRIN) 0.05 % nasal spray, Place 2 sprays into both nostrils daily as needed for congestion., Disp: , Rfl:  .  PROVENTIL HFA 108 (90 Base) MCG/ACT inhaler, INHALE 2 PUFFS BY MOUTH EVERY 6 HOURS AS NEEDED FOR COUGHING, WHEEZING, OR SHORTNESS OF BREATH, Disp: 20.1 g, Rfl: 1 .  ranitidine  (ZANTAC) 150 MG tablet, Take 300 mg by mouth at bedtime. , Disp: , Rfl:  .  tetrahydrozoline 0.05 % ophthalmic solution, Place 1 drop into both eyes as needed., Disp: , Rfl:  .  traZODone (DESYREL) 100 MG tablet, Take 200 mg by mouth at bedtime. , Disp: , Rfl:  .  vitamin B-12 (CYANOCOBALAMIN) 500 MCG tablet, Take 500 mcg by mouth daily., Disp: , Rfl:  .  omeprazole (PRILOSEC) 40 MG capsule, Take 1 capsule (40 mg total) by mouth daily. (Patient not taking: Reported on 12/28/2017), Disp: 90 capsule, Rfl: 1 .  pantoprazole (PROTONIX) 20 MG tablet, Take 1 tablet (20 mg total) by mouth daily. (Patient not taking: Reported on 12/28/2017), Disp: 30 tablet, Rfl: 1 .  sildenafil (VIAGRA) 100 MG tablet, Take 0.5-1 tablets (50-100 mg total) by mouth daily as needed for erectile dysfunction. (Patient not taking: Reported on 12/28/2017), Disp: 10 tablet, Rfl: 0   Review of Systems  Constitutional: Negative for appetite change, chills, diaphoresis, fatigue, fever and unexpected weight change.  HENT: Negative for congestion, dental problem, drooling, ear pain, facial swelling, hearing loss, mouth sores, sneezing, sore throat, trouble swallowing and voice change.   Eyes: Negative for pain, discharge, redness, itching and visual disturbance.  Respiratory: Positive for shortness of  breath and wheezing. Negative for cough and choking.   Cardiovascular: Negative for chest pain, palpitations and leg swelling.  Gastrointestinal: Negative for abdominal pain, blood in stool, constipation, diarrhea and vomiting.  Endocrine: Negative for cold intolerance, heat intolerance and polydipsia.  Genitourinary: Negative for decreased urine volume, dysuria and hematuria.  Musculoskeletal: Negative for arthralgias, back pain and gait problem.  Skin: Negative for rash.  Allergic/Immunologic: Negative for environmental allergies.  Neurological: Negative for seizures, syncope, light-headedness and headaches.  Hematological: Negative  for adenopathy.  Psychiatric/Behavioral: Negative for agitation, dysphoric mood and suicidal ideas. The patient is not nervous/anxious.     Per HPI unless specifically indicated above     Objective:    BP 126/76 (BP Location: Right Arm, Patient Position: Sitting, Cuff Size: Normal)   Pulse 81   Temp (!) 97.3 F (36.3 C) (Oral)   Ht 5\' 8"  (1.727 m)   Wt 143 lb 8 oz (65.1 kg)   SpO2 94%   BMI 21.82 kg/m   Wt Readings from Last 3 Encounters:  12/28/17 143 lb 8 oz (65.1 kg)  12/16/17 145 lb (65.8 kg)  10/02/17 149 lb (67.6 kg)    Physical Exam  Constitutional: He is oriented to person, place, and time. He appears well-developed and well-nourished.  HENT:  Head: Normocephalic and atraumatic.  Neck: Neck supple.  Cardiovascular: Normal rate and regular rhythm.  Pulmonary/Chest: Effort normal and breath sounds normal. He has no wheezes.  Abdominal: Soft. Bowel sounds are normal. There is no hepatosplenomegaly. There is no tenderness.  Musculoskeletal: He exhibits no edema.  Lymphadenopathy:    He has no cervical adenopathy.  Neurological: He is alert and oriented to person, place, and time.  Skin: Skin is warm and dry.  Psychiatric: He has a normal mood and affect. His behavior is normal.  Vitals reviewed.   Results for orders placed or performed during the hospital encounter of 12/16/17  Lipase, blood  Result Value Ref Range   Lipase 95 (H) 11 - 51 U/L  Comprehensive metabolic panel  Result Value Ref Range   Sodium 138 135 - 145 mmol/L   Potassium 3.8 3.5 - 5.1 mmol/L   Chloride 91 (L) 101 - 111 mmol/L   CO2 33 (H) 22 - 32 mmol/L   Glucose, Bld 145 (H) 65 - 99 mg/dL   BUN 8 6 - 20 mg/dL   Creatinine, Ser 0.72 0.61 - 1.24 mg/dL   Calcium 10.1 8.9 - 10.3 mg/dL   Total Protein 9.1 (H) 6.5 - 8.1 g/dL   Albumin 4.7 3.5 - 5.0 g/dL   AST 57 (H) 15 - 41 U/L   ALT 39 17 - 63 U/L   Alkaline Phosphatase 72 38 - 126 U/L   Total Bilirubin 1.4 (H) 0.3 - 1.2 mg/dL   GFR calc  non Af Amer >60 >60 mL/min   GFR calc Af Amer >60 >60 mL/min   Anion gap 14 5 - 15  CBC with Differential/Platelet  Result Value Ref Range   WBC 9.8 4.0 - 10.5 K/uL   RBC 5.15 4.22 - 5.81 MIL/uL   Hemoglobin 14.8 13.0 - 17.0 g/dL   HCT 45.4 39.0 - 52.0 %   MCV 88.2 78.0 - 100.0 fL   MCH 28.7 26.0 - 34.0 pg   MCHC 32.6 30.0 - 36.0 g/dL   RDW 18.0 (H) 11.5 - 15.5 %   Platelets 206 150 - 400 K/uL   Neutrophils Relative % 67 %   Neutro Abs 6.6  1.7 - 7.7 K/uL   Lymphocytes Relative 19 %   Lymphs Abs 1.8 0.7 - 4.0 K/uL   Monocytes Relative 14 %   Monocytes Absolute 1.3 (H) 0.1 - 1.0 K/uL   Eosinophils Relative 0 %   Eosinophils Absolute 0.0 0.0 - 0.7 K/uL   Basophils Relative 0 %   Basophils Absolute 0.0 0.0 - 0.1 K/uL  Urinalysis, Routine w reflex microscopic  Result Value Ref Range   Color, Urine AMBER (A) YELLOW   APPearance HAZY (A) CLEAR   Specific Gravity, Urine 1.031 (H) 1.005 - 1.030   pH 7.0 5.0 - 8.0   Glucose, UA 50 (A) NEGATIVE mg/dL   Hgb urine dipstick NEGATIVE NEGATIVE   Bilirubin Urine MODERATE (A) NEGATIVE   Ketones, ur 20 (A) NEGATIVE mg/dL   Protein, ur >=300 (A) NEGATIVE mg/dL   Nitrite NEGATIVE NEGATIVE   Leukocytes, UA NEGATIVE NEGATIVE   RBC / HPF 11-20 0 - 5 RBC/hpf   WBC, UA 0-5 0 - 5 WBC/hpf   Bacteria, UA RARE (A) NONE SEEN   Squamous Epithelial / LPF 0-5 0 - 5   Mucus PRESENT    Hyaline Casts, UA PRESENT   Troponin I  Result Value Ref Range   Troponin I <0.03 <0.03 ng/mL      Assessment & Plan:    Encounter Diagnoses  Name Primary?  . Chronic obstructive pulmonary disease, unspecified COPD type ([place]) Yes  . Nicotine dependence, cigarettes, with other nicotine-induced disorders   . Hypertension, unspecified type   . Alcohol abuse   . Anxiety   . Depression, unspecified depression type     -counseled pt on alcohol and smoking cessation including that continuing those will worsen his health and lead to premature death -pt to continue  current medications and is given a refill on his viagra -pt to continue with [facility] -pt to follow up in 3 months.  RTO sooner prn

## 2018-03-29 ENCOUNTER — Ambulatory Visit: Payer: Self-pay | Admitting: Physician Assistant

## 2018-04-03 ENCOUNTER — Encounter: Payer: Self-pay | Admitting: Physician Assistant

## 2018-04-21 ENCOUNTER — Emergency Department (HOSPITAL_COMMUNITY)
Admission: EM | Admit: 2018-04-21 | Discharge: 2018-04-21 | Disposition: A | Discharge status: Home / Self Care | Payer: Self-pay | Attending: Emergency Medicine | Admitting: Emergency Medicine

## 2018-04-21 ENCOUNTER — Other Ambulatory Visit: Payer: Self-pay

## 2018-04-21 ENCOUNTER — Encounter (HOSPITAL_COMMUNITY): Payer: Self-pay | Admitting: Emergency Medicine

## 2018-04-21 DIAGNOSIS — J449 Chronic obstructive pulmonary disease, unspecified: Secondary | ICD-10-CM | POA: Insufficient documentation

## 2018-04-21 DIAGNOSIS — Z7982 Long term (current) use of aspirin: Secondary | ICD-10-CM | POA: Insufficient documentation

## 2018-04-21 DIAGNOSIS — F17210 Nicotine dependence, cigarettes, uncomplicated: Secondary | ICD-10-CM | POA: Insufficient documentation

## 2018-04-21 DIAGNOSIS — R112 Nausea with vomiting, unspecified: Secondary | ICD-10-CM | POA: Insufficient documentation

## 2018-04-21 DIAGNOSIS — I10 Essential (primary) hypertension: Secondary | ICD-10-CM | POA: Insufficient documentation

## 2018-04-21 DIAGNOSIS — Z79899 Other long term (current) drug therapy: Secondary | ICD-10-CM | POA: Insufficient documentation

## 2018-04-21 DIAGNOSIS — R1013 Epigastric pain: Secondary | ICD-10-CM | POA: Insufficient documentation

## 2018-04-21 LAB — COMPREHENSIVE METABOLIC PANEL
ALT: 21 U/L (ref 0–44)
AST: 35 U/L (ref 15–41)
Albumin: 4.2 g/dL (ref 3.5–5.0)
Alkaline Phosphatase: 79 U/L (ref 38–126)
Anion gap: 10 (ref 5–15)
BUN: 7 mg/dL (ref 6–20)
CO2: 27 mmol/L (ref 22–32)
Calcium: 9.9 mg/dL (ref 8.9–10.3)
Chloride: 99 mmol/L (ref 98–111)
Creatinine, Ser: 0.69 mg/dL (ref 0.61–1.24)
GFR calc Af Amer: >60 mL/min (ref >60)
GFR calc non Af Amer: >60 mL/min (ref >60)
Glucose, Bld: 123 mg/dL — ABNORMAL HIGH (ref 70–99)
Potassium: 4.1 mmol/L (ref 3.5–5.1)
Sodium: 136 mmol/L (ref 135–145)
Total Bilirubin: 1.5 mg/dL — ABNORMAL HIGH (ref 0.3–1.2)
Total Protein: 8.8 g/dL — ABNORMAL HIGH (ref 6.5–8.1)

## 2018-04-21 LAB — CBC
HCT: 45.8 % (ref 39.0–52.0)
Hemoglobin: 15.7 g/dL (ref 13.0–17.0)
MCH: 31.4 pg (ref 26.0–34.0)
MCHC: 34.3 g/dL (ref 30.0–36.0)
MCV: 91.6 fL (ref 78.0–100.0)
Platelets: 300 10*3/uL (ref 150–400)
RBC: 5 MIL/uL (ref 4.22–5.81)
RDW: 15.7 % — ABNORMAL HIGH (ref 11.5–15.5)
WBC: 12.2 10*3/uL — ABNORMAL HIGH (ref 4.0–10.5)

## 2018-04-21 LAB — LIPASE, BLOOD: Lipase: 41 U/L (ref 11–51)

## 2018-04-21 MED ORDER — SODIUM CHLORIDE 0.9 % IV BOLUS
1000.0000 mL | Freq: Once | INTRAVENOUS | Status: AC
  Administered 2018-04-21: 1000 mL via INTRAVENOUS

## 2018-04-21 MED ORDER — FAMOTIDINE IN NACL 20-0.9 MG/50ML-% IV SOLN
20.0000 mg | Freq: Once | INTRAVENOUS | Status: AC
  Administered 2018-04-21: 20 mg via INTRAVENOUS
  Filled 2018-04-21: qty 50

## 2018-04-21 MED ORDER — HYDROMORPHONE HCL 1 MG/ML IJ SOLN
1.0000 mg | Freq: Once | INTRAMUSCULAR | Status: AC
  Administered 2018-04-21: 1 mg via INTRAVENOUS
  Filled 2018-04-21: qty 1

## 2018-04-21 MED ORDER — ONDANSETRON HCL 4 MG PO TABS: 4.0000 mg | ORAL_TABLET | Freq: Three times a day (TID) | ORAL | 0 refills | Status: DC | PRN

## 2018-04-21 MED ORDER — PROMETHAZINE HCL 25 MG/ML IJ SOLN
12.5000 mg | Freq: Once | INTRAMUSCULAR | Status: AC
  Administered 2018-04-21: 12.5 mg via INTRAVENOUS
  Filled 2018-04-21: qty 1

## 2018-04-21 NOTE — ED Triage Notes (Signed)
Patient c/o mid abd pain with nausea and vomiting that started last night. Per patient feels if though he has a fever. Denies any diarrhea or urinary symptoms. Patient reports taking antacids to help with acid reflux with no relief.

## 2018-04-22 ENCOUNTER — Emergency Department (HOSPITAL_COMMUNITY): Payer: Self-pay

## 2018-04-22 ENCOUNTER — Encounter (HOSPITAL_COMMUNITY): Payer: Self-pay | Admitting: Emergency Medicine

## 2018-04-22 ENCOUNTER — Emergency Department (HOSPITAL_COMMUNITY)
Admission: EM | Admit: 2018-04-22 | Discharge: 2018-04-22 | Disposition: A | Discharge status: Home / Self Care | Payer: Self-pay | Attending: Emergency Medicine | Admitting: Emergency Medicine

## 2018-04-22 DIAGNOSIS — Z79899 Other long term (current) drug therapy: Secondary | ICD-10-CM | POA: Insufficient documentation

## 2018-04-22 DIAGNOSIS — I10 Essential (primary) hypertension: Secondary | ICD-10-CM | POA: Insufficient documentation

## 2018-04-22 DIAGNOSIS — J449 Chronic obstructive pulmonary disease, unspecified: Secondary | ICD-10-CM | POA: Insufficient documentation

## 2018-04-22 DIAGNOSIS — Z7982 Long term (current) use of aspirin: Secondary | ICD-10-CM | POA: Insufficient documentation

## 2018-04-22 DIAGNOSIS — R1010 Upper abdominal pain, unspecified: Secondary | ICD-10-CM | POA: Insufficient documentation

## 2018-04-22 DIAGNOSIS — F17210 Nicotine dependence, cigarettes, uncomplicated: Secondary | ICD-10-CM | POA: Insufficient documentation

## 2018-04-22 LAB — CBC WITH DIFFERENTIAL/PLATELET
BASOS ABS: 0 10*3/uL (ref 0.0–0.1)
Basophils Relative: 0 %
Eosinophils Absolute: 0 10*3/uL (ref 0.0–0.7)
Eosinophils Relative: 0 %
HEMATOCRIT: 44.7 % (ref 39.0–52.0)
Hemoglobin: 15 g/dL (ref 13.0–17.0)
LYMPHS PCT: 9 %
Lymphs Abs: 1.2 10*3/uL (ref 0.7–4.0)
MCH: 31 pg (ref 26.0–34.0)
MCHC: 33.6 g/dL (ref 30.0–36.0)
MCV: 92.4 fL (ref 78.0–100.0)
MONO ABS: 0.8 10*3/uL (ref 0.1–1.0)
Monocytes Relative: 6 %
NEUTROS ABS: 11.3 10*3/uL — AB (ref 1.7–7.7)
Neutrophils Relative %: 85 %
Platelets: 283 10*3/uL (ref 150–400)
RBC: 4.84 MIL/uL (ref 4.22–5.81)
RDW: 15.6 % — ABNORMAL HIGH (ref 11.5–15.5)
WBC: 13.3 10*3/uL — AB (ref 4.0–10.5)

## 2018-04-22 LAB — COMPREHENSIVE METABOLIC PANEL
ALBUMIN: 4.1 g/dL (ref 3.5–5.0)
ALT: 18 U/L (ref 0–44)
AST: 23 U/L (ref 15–41)
Alkaline Phosphatase: 72 U/L (ref 38–126)
Anion gap: 10 (ref 5–15)
BILIRUBIN TOTAL: 1.3 mg/dL — AB (ref 0.3–1.2)
BUN: 8 mg/dL (ref 6–20)
CO2: 28 mmol/L (ref 22–32)
Calcium: 9.4 mg/dL (ref 8.9–10.3)
Chloride: 97 mmol/L — ABNORMAL LOW (ref 98–111)
Creatinine, Ser: 0.66 mg/dL (ref 0.61–1.24)
GFR calc Af Amer: >60 mL/min (ref >60)
GFR calc non Af Amer: >60 mL/min (ref >60)
GLUCOSE: 113 mg/dL — AB (ref 70–99)
Potassium: 3.9 mmol/L (ref 3.5–5.1)
Sodium: 135 mmol/L (ref 135–145)
TOTAL PROTEIN: 8.4 g/dL — AB (ref 6.5–8.1)

## 2018-04-22 LAB — LIPASE, BLOOD: LIPASE: 60 U/L — AB (ref 11–51)

## 2018-04-22 IMAGING — CT CT ABD-PELV W/ CM
2 of 5 series · 16 of 46 positions shown, 18 images · IV contrast (Isovue)
Comparison: [DATE]

CLINICAL DATA: Vomiting

EXAM:
CT ABDOMEN AND PELVIS WITH CONTRAST
TECHNIQUE: Multidetector CT imaging of the abdomen and pelvis was performed
using the standard protocol following bolus administration of
intravenous contrast.
CONTRAST:  100mL [QG] IOPAMIDOL ([QG]) INJECTION 61%

[Series 2: axial st · axial · 0.81mm/px · z∈[+948,+1308]mm · 13 of 80 slices shown, 15 images]
[im 4/80  soft-tissue]
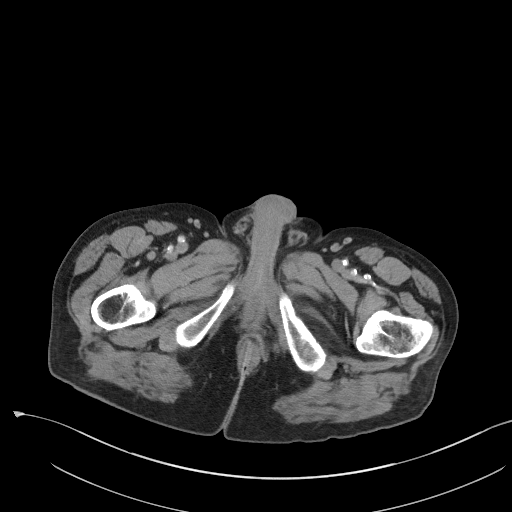
[im 4/80  bone]
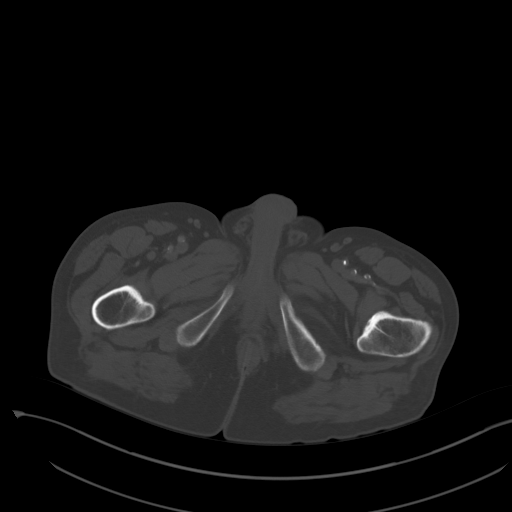
[im 12/80  soft-tissue]
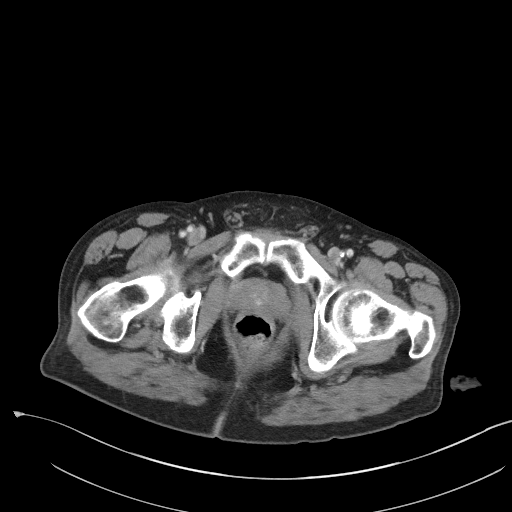
[im 16/80  soft-tissue]
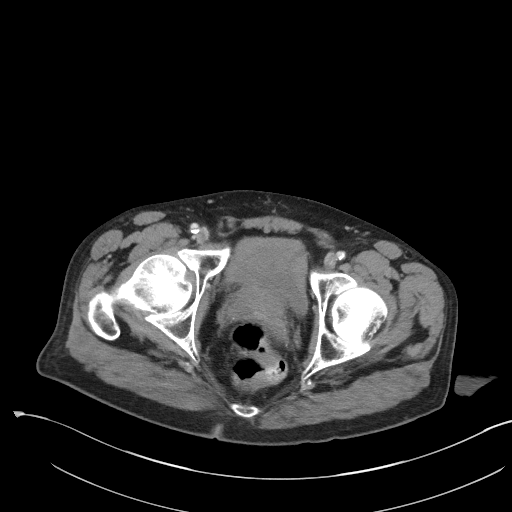
[im 24/80  soft-tissue]
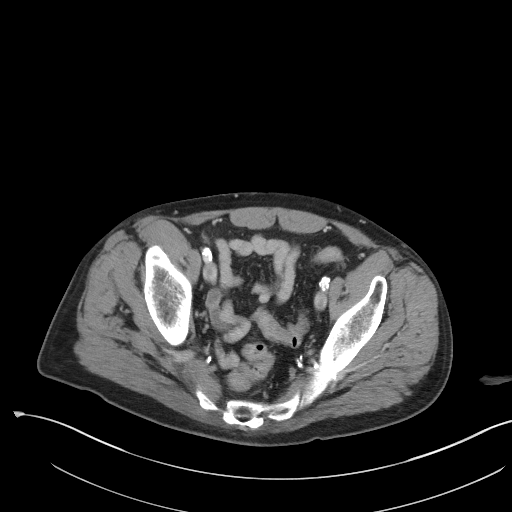
[im 28/80  soft-tissue]
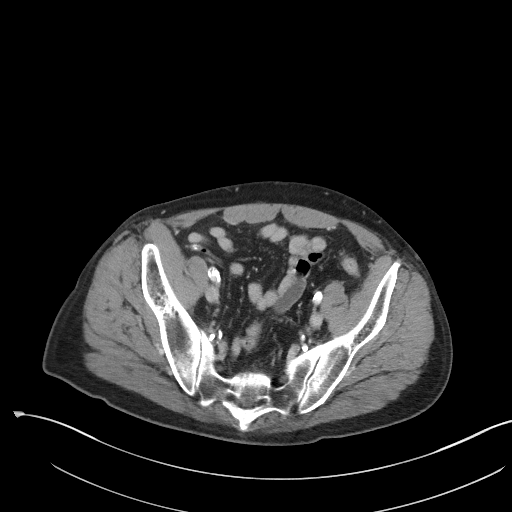
[im 36/80  soft-tissue]
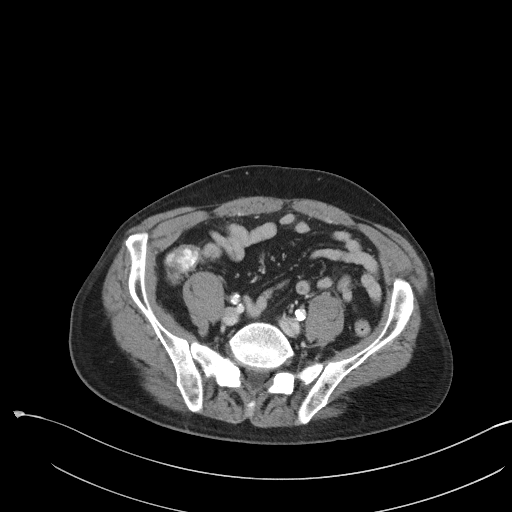
[im 40/80  soft-tissue]
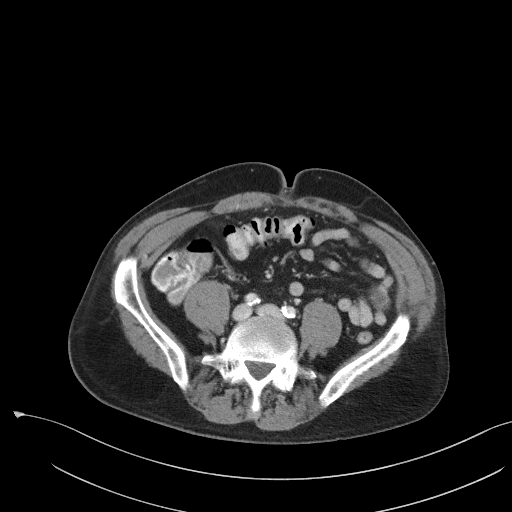
[im 44/80  soft-tissue]
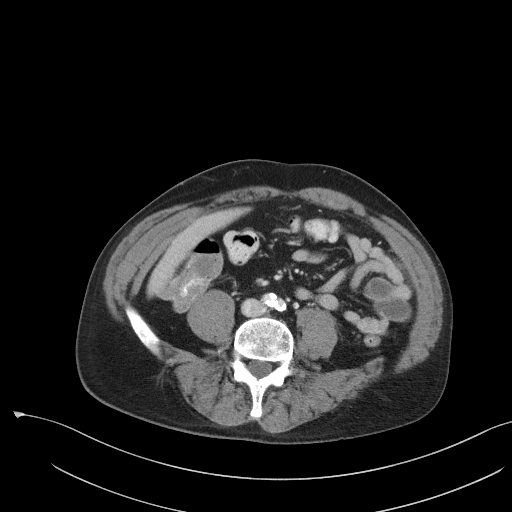
[im 52/80  soft-tissue]
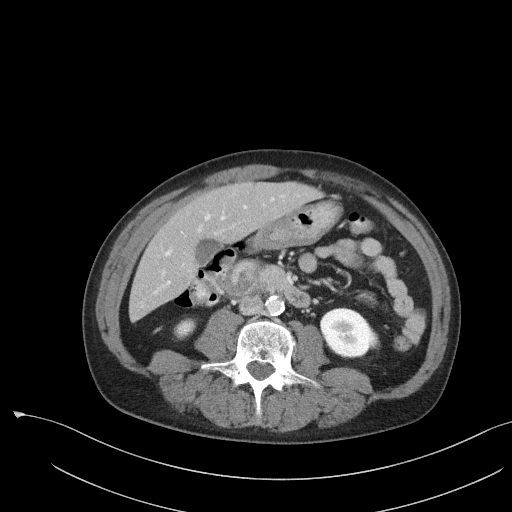
[im 52/80  bone]
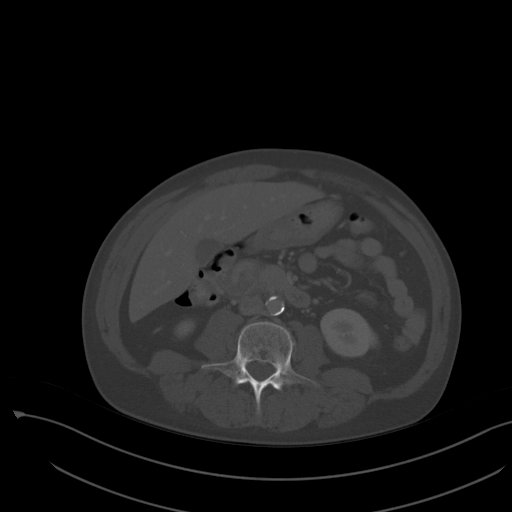
[im 56/80  soft-tissue]
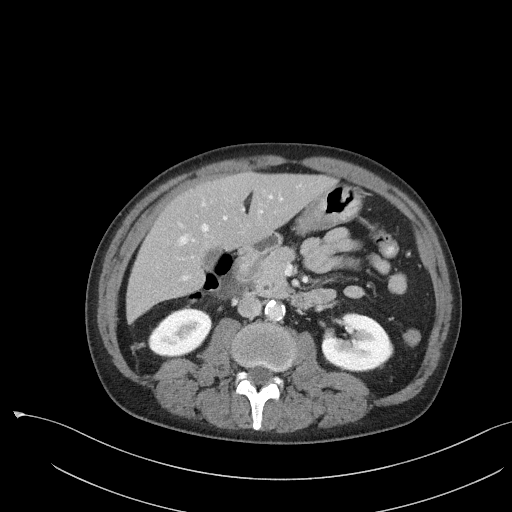
[im 64/80  soft-tissue]
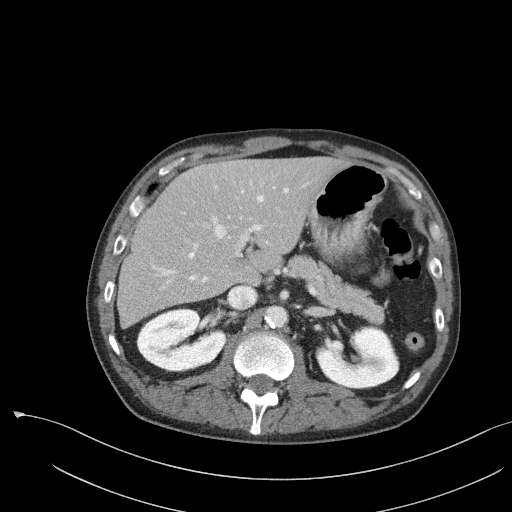
[im 68/80  soft-tissue]
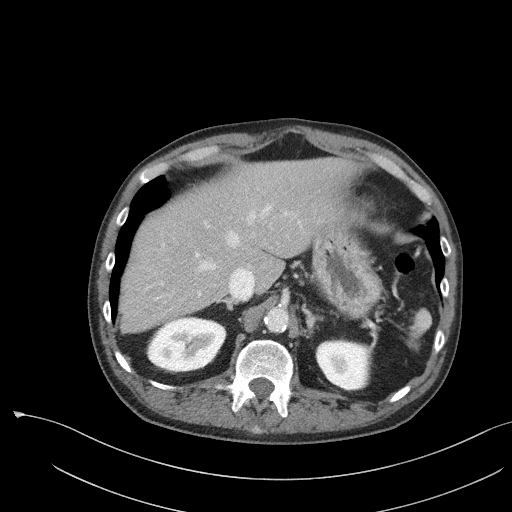
[im 76/80  soft-tissue]
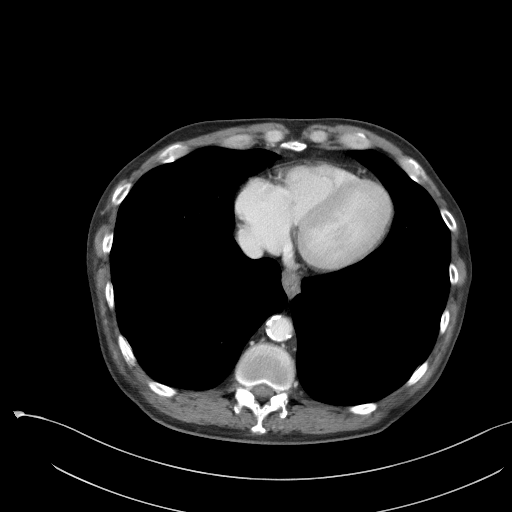

[Series 4: coronal st · coronal · 0.69mm/px · 3 of 79 slices shown]
[im 27/79  soft-tissue]
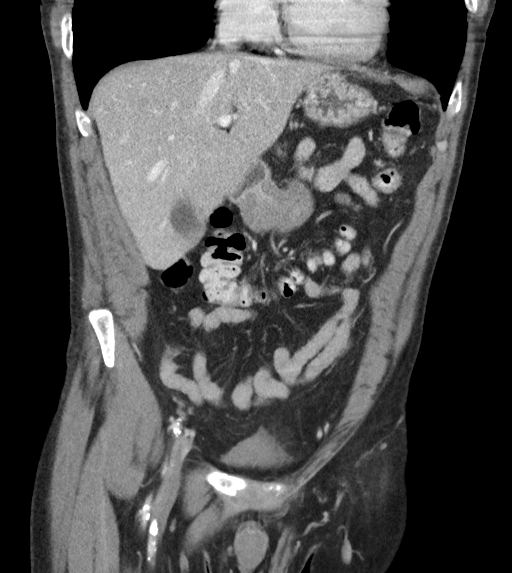
[im 35/79  soft-tissue]
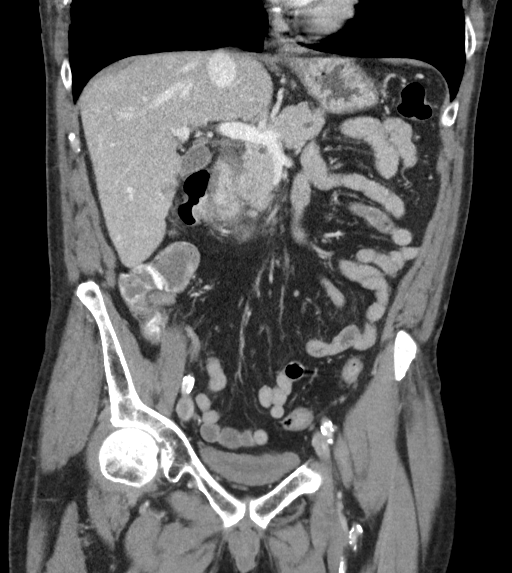
[im 44/79  soft-tissue]
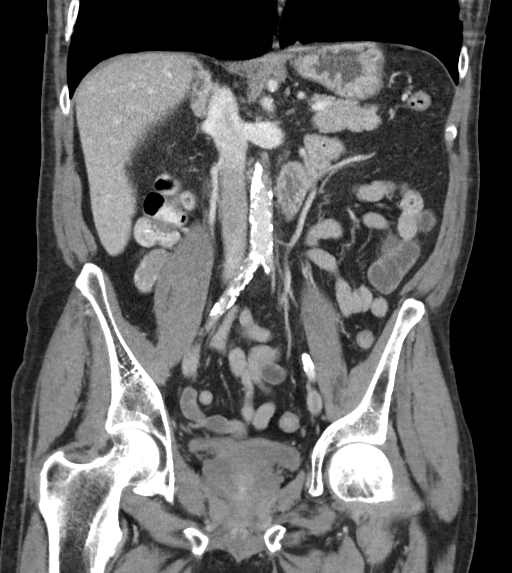

[16 of 46 positions shown; findings below may reference images not displayed]

FINDINGS: Lower chest: Lung bases are clear.

Hepatobiliary: Liver is within normal limits.

Gallbladder is unremarkable. No intrahepatic or extrahepatic ductal
dilatation.

Pancreas: Mild prominence of the main pancreatic duct, measuring up
to 4-5 mm, unchanged. No parenchymal atrophy or pancreatic mass.
Inflammatory changes along the pancreaticoduodenal groove, described
below. No pancreatic pseudocyst.

Spleen: Within normal limits.

Adrenals/Urinary Tract: Adrenal glands within normal limits.

Kidneys are within normal limits.  No hydronephrosis.

Bladder is underdistended but unremarkable.

Stomach/Bowel: Stomach is within normal limits.

Inflammatory changes/fluid along the proximal duodenum and
pancreaticoduodenal groove (series 2/images 24 and 31).
Mucosal/submucosal wall thickening along the left lateral wall of
the duodenum adjacent to the pancreas (series 4/image 39).

No evidence of bowel obstruction.

Normal appendix (series 2/image 53).

Vascular/Lymphatic: No evidence of abdominal aortic aneurysm.

Atherosclerotic calcifications of the abdominal aorta and branch
vessels.

No suspicious abdominopelvic lymphadenopathy.

Reproductive: Prostate is unremarkable.

Other: No abdominopelvic ascites.

Musculoskeletal: Visualized osseous structures are within normal
limits.
IMPRESSION: Inflammatory changes/fluid along the proximal duodenum and
pancreaticoduodenal groove. This appearance may reflect duodenitis
or groove pancreatitis.

Focal mucosal/submucosal wall thickening along the left lateral wall
of the duodenum adjacent to the pancreas. Following resolution of
the patient's clinical symptoms, EUS is suggested for further
evaluation to exclude an underlying mass in this region.

## 2018-04-22 MED ORDER — HYDROMORPHONE HCL 1 MG/ML IJ SOLN
0.5000 mg | Freq: Once | INTRAMUSCULAR | Status: AC
  Administered 2018-04-22: 0.5 mg via INTRAVENOUS
  Filled 2018-04-22: qty 1

## 2018-04-22 MED ORDER — SODIUM CHLORIDE 0.9 % IV BOLUS
1000.0000 mL | Freq: Once | INTRAVENOUS | Status: AC
  Administered 2018-04-22: 1000 mL via INTRAVENOUS

## 2018-04-22 MED ORDER — LORAZEPAM 2 MG/ML IJ SOLN
0.5000 mg | Freq: Once | INTRAMUSCULAR | Status: AC
  Administered 2018-04-22: 0.5 mg via INTRAVENOUS
  Filled 2018-04-22: qty 1

## 2018-04-22 MED ORDER — ONDANSETRON HCL 4 MG/2ML IJ SOLN
4.0000 mg | Freq: Once | INTRAMUSCULAR | Status: AC
  Administered 2018-04-22: 4 mg via INTRAVENOUS
  Filled 2018-04-22: qty 2

## 2018-04-22 MED ORDER — FAMOTIDINE IN NACL 20-0.9 MG/50ML-% IV SOLN
20.0000 mg | Freq: Once | INTRAVENOUS | Status: AC
  Administered 2018-04-22: 20 mg via INTRAVENOUS
  Filled 2018-04-22: qty 50

## 2018-04-22 MED ORDER — PANTOPRAZOLE SODIUM 20 MG PO TBEC: 20.0000 mg | DELAYED_RELEASE_TABLET | Freq: Every day | ORAL | 0 refills | Status: DC

## 2018-04-22 MED ORDER — TRAMADOL HCL 50 MG PO TABS: 50.0000 mg | ORAL_TABLET | Freq: Four times a day (QID) | ORAL | 0 refills | Status: DC | PRN

## 2018-04-22 MED ORDER — IOPAMIDOL (ISOVUE-300) INJECTION 61%
100.0000 mL | Freq: Once | INTRAVENOUS | Status: AC | PRN
  Administered 2018-04-22: 100 mL via INTRAVENOUS

## 2018-04-22 MED ORDER — ONDANSETRON 4 MG PO TBDP: ORAL_TABLET | ORAL | 0 refills | Status: DC

## 2018-04-22 NOTE — Discharge Instructions (Signed)
Follow up with the stomach specialist Dr. Laural Golden or Dr. Sydell Axon.   Also follow-up with your family provider later this week return if any problems

## 2018-04-22 NOTE — ED Provider Notes (Signed)
[facility] EMERGENCY DEPARTMENT Provider Note   CSN: [id number] Arrival date & time: 04/22/18  1006     History   Chief Complaint Chief Complaint  Patient presents with  . Emesis    HPI Bryan Fleming is a 57 y.o. male.  Patient complains of abdominal pain and vomiting.  Patient has a history of pancreatitis  The history is provided by the patient. No language interpreter was used.  Emesis   This is a new problem. The current episode started 6 to 12 hours ago. The problem occurs 2 to 4 times per day. The problem has not changed since onset.The emesis has an appearance of stomach contents. There has been no fever. Pertinent negatives include no abdominal pain, no chills, no cough, no diarrhea and no headaches. Risk factors: History of pancreatitis.    Past Medical History:  Diagnosis Date  . COPD (chronic obstructive pulmonary disease) ([place]) 2007   EMPHYSEMA  . GERD (gastroesophageal reflux disease) 2010  . Hepatitis    Alcoholic   . Hypertension     Patient Active Problem List   Diagnosis Date Noted  . Elevated liver function tests 02/02/2016  . 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 Alcohol abuse 08/30/2015  . Anxiety state 08/30/2015  . Nicotine dependence, cigarettes, with other nicotine-induced disorders 08/30/2015  . Hepatitis 08/31/2011  . Hypertension   . GERD (gastroesophageal reflux disease)   . COPD (chronic obstructive pulmonary disease) ([place])     Past Surgical History:  Procedure Laterality Date  . CARDIAC CATHETERIZATION     4-5 YEARS AGO  . COLONOSCOPY  03/14/2012   Procedure: COLONOSCOPY;  Surgeon: Rogene Houston, MD;  Location: [facility] ENDO SUITE;  Service: Endoscopy;  Laterality: N/A;  100  . WRIST GANGLION EXCISION  1985        Home Medications    Prior to Admission medications   Medication Sig Start Date End Date Taking? Authorizing Provider  aspirin 81 MG tablet Take 81 mg by mouth daily.    [provider]  citalopram (CELEXA) 20  MG tablet Take 20 mg by mouth daily.    [provider]  diphenhydrAMINE (BENADRYL) 25 MG tablet Take 25 mg by mouth daily as needed for itching or allergies.     [provider]  loperamide (IMODIUM A-D) 2 MG tablet Take 2 mg by mouth daily as needed for diarrhea or loose stools.    [provider]  metoprolol tartrate (LOPRESSOR) 100 MG tablet TAKE 1 Tablet  BY MOUTH TWICE DAILY Patient taking differently: Take 100 mg by mouth 2 (two) times daily.  11/13/17   Soyla Dryer, PA-C  mometasone-formoterol (DULERA) 200-5 MCG/ACT AERO Inhale 2 puffs into the lungs 2 (two) times daily. 08/16/17   Soyla Dryer, PA-C  ondansetron (ZOFRAN ODT) 4 MG disintegrating tablet 4mg  ODT q4 hours prn nausea/vomit 04/22/18   Milton Ferguson, MD  ondansetron (ZOFRAN) 4 MG tablet Take 1 tablet (4 mg total) by mouth every 8 (eight) hours as needed for nausea or vomiting. 04/21/18   Virgel Manifold, MD  oxymetazoline (AFRIN) 0.05 % nasal spray Place 2 sprays into both nostrils daily as needed for congestion.    [provider]  pantoprazole (PROTONIX) 20 MG tablet Take 1 tablet (20 mg total) by mouth daily. 04/22/18   Milton Ferguson, MD  PROVENTIL HFA 108 [phone] Base) MCG/ACT inhaler INHALE 2 PUFFS BY MOUTH EVERY 6 HOURS AS NEEDED FOR COUGHING, WHEEZING, OR SHORTNESS OF BREATH Patient taking differently: Inhale 2  puffs into the lungs every 6 (six) hours as needed for wheezing or shortness of breath.  11/13/17   Soyla Dryer, PA-C  ranitidine (ZANTAC) 150 MG tablet Take 300 mg by mouth at bedtime.     [provider]  sildenafil (VIAGRA) 100 MG tablet Take 0.5-1 tablets (50-100 mg total) by mouth daily as needed for erectile dysfunction. 12/28/17   Soyla Dryer, PA-C  tetrahydrozoline 0.05 % ophthalmic solution Place 1 drop into both eyes daily as needed (for dry eye).     [provider]  traMADol (ULTRAM) 50 MG tablet Take 1 tablet (50 mg total) by mouth every 6 (six)  hours as needed. 04/22/18   Milton Ferguson, MD  traZODone (DESYREL) 100 MG tablet Take 200 mg by mouth at bedtime as needed for sleep.     [provider]  vitamin B-12 (CYANOCOBALAMIN) 500 MCG tablet Take 500 mcg by mouth daily.    [provider]    Family History Family History  Problem Relation Age of Onset  . Hypertension Mother   . Asthma Father   . Diabetes Sister   . Hypertension Sister   . Hypertension Brother   . Hypertension Sister   . Lupus Daughter     Social History Social History   Tobacco Use  . Smoking status: Current Every Day Smoker    Packs/day: 1.00    Years: 37.00    Pack years: 37.00    Types: Cigarettes  . Smokeless tobacco: Never Used  Substance Use Topics  . Alcohol use: Yes    Alcohol/week: 22.0 standard drinks    Types: 21 Cans of beer, 1 Standard drinks or equivalent per week    Comment: daily- last drink yesterday  . Drug use: No     Allergies   Patient has no known allergies.   Review of Systems Review of Systems  Constitutional: Negative for appetite change, chills and fatigue.  HENT: Negative for congestion, ear discharge and sinus pressure.   Eyes: Negative for discharge.  Respiratory: Negative for cough.   Cardiovascular: Negative for chest pain.  Gastrointestinal: Positive for vomiting. Negative for abdominal pain and diarrhea.  Genitourinary: Negative for frequency and hematuria.  Musculoskeletal: Negative for back pain.  Skin: Negative for rash.  Neurological: Negative for seizures and headaches.  Psychiatric/Behavioral: Negative for hallucinations.     Physical Exam Updated Vital Signs BP (!) 197/94 (BP Location: Left Arm)   Pulse 77   Temp 98.7 F (37.1 C) (Oral)   Resp 18   Ht 5\' 9"  (1.753 m)   Wt 66 kg   SpO2 99%   BMI 21.49 kg/m   Physical Exam  Constitutional: He is oriented to person, place, and time. He appears well-developed.  HENT:  Head: Normocephalic.  Eyes: Conjunctivae and EOM  are normal. No scleral icterus.  Neck: Neck supple. No thyromegaly present.  Cardiovascular: Normal rate and regular rhythm. Exam reveals no gallop and no friction rub.  No murmur heard. Pulmonary/Chest: No stridor. He has no wheezes. He has no rales. He exhibits no tenderness.  Abdominal: He exhibits no distension. There is tenderness. There is no rebound.  Musculoskeletal: Normal range of motion. He exhibits no edema.  Lymphadenopathy:    He has no cervical adenopathy.  Neurological: He is oriented to person, place, and time. He exhibits normal muscle tone. Coordination normal.  Skin: No rash noted. No erythema.  Psychiatric: He has a normal mood and affect. His behavior is normal.  ED Treatments / Results  Labs (all labs ordered are listed, but only abnormal results are displayed) Labs Reviewed  CBC WITH DIFFERENTIAL/PLATELET - Abnormal; Notable for the following components:      Result Value   WBC 13.3 (*)    RDW 15.6 (*)    Neutro Abs 11.3 (*)    All other components within normal limits  COMPREHENSIVE METABOLIC PANEL - Abnormal; Notable for the following components:   Chloride 97 (*)    Glucose, Bld 113 (*)    Total Protein 8.4 (*)    Total Bilirubin 1.3 (*)    All other components within normal limits  LIPASE, BLOOD - Abnormal; Notable for the following components:   Lipase 60 (*)    All other components within normal limits    EKG None  Radiology Ct Abdomen Pelvis W Contrast  Result Date: 04/22/2018 CLINICAL DATA:  Vomiting EXAM: CT ABDOMEN AND PELVIS WITH CONTRAST TECHNIQUE: Multidetector CT imaging of the abdomen and pelvis was performed using the standard protocol following bolus administration of intravenous contrast. CONTRAST:  149mL ISOVUE-300 IOPAMIDOL (ISOVUE-300) INJECTION 61% COMPARISON:  06/16/2016 FINDINGS: Lower chest: Lung bases are clear. Hepatobiliary: Liver is within normal limits. Gallbladder is unremarkable. No intrahepatic or extrahepatic  ductal dilatation. Pancreas: Mild prominence of the main pancreatic duct, measuring up to 4-5 mm, unchanged. No parenchymal atrophy or pancreatic mass. Inflammatory changes along the pancreaticoduodenal groove, described below. No pancreatic pseudocyst. Spleen: Within normal limits. Adrenals/Urinary Tract: Adrenal glands within normal limits. Kidneys are within normal limits.  No hydronephrosis. Bladder is underdistended but unremarkable. Stomach/Bowel: Stomach is within normal limits. Inflammatory changes/fluid along the proximal duodenum and pancreaticoduodenal groove (series 2/images 24 and 31). Mucosal/submucosal wall thickening along the left lateral wall of the duodenum adjacent to the pancreas (series 4/image 39). No evidence of bowel obstruction. Normal appendix (series 2/image 53). Vascular/Lymphatic: No evidence of abdominal aortic aneurysm. Atherosclerotic calcifications of the abdominal aorta and branch vessels. No suspicious abdominopelvic lymphadenopathy. Reproductive: Prostate is unremarkable. Other: No abdominopelvic ascites. Musculoskeletal: Visualized osseous structures are within normal limits. IMPRESSION: Inflammatory changes/fluid along the proximal duodenum and pancreaticoduodenal groove. This appearance may reflect duodenitis or groove pancreatitis. Focal mucosal/submucosal wall thickening along the left lateral wall of the duodenum adjacent to the pancreas. Following resolution of the patient's clinical symptoms, EUS is suggested for further evaluation to exclude an underlying mass in this region. Electronically Signed   By: Julian Hy M.D.   On: 04/22/2018 11:21    Procedures Procedures (including critical care time)  Medications Ordered in ED Medications  sodium chloride 0.9 % bolus 1,000 mL (1,000 mLs Intravenous New Bag/Given 04/22/18 1101)  famotidine (PEPCID) IVPB 20 mg premix (0 mg Intravenous Stopped 04/22/18 1132)  ondansetron (ZOFRAN) injection 4 mg (4 mg Intravenous  Given 04/22/18 1040)  HYDROmorphone (DILAUDID) injection 0.5 mg (0.5 mg Intravenous Given 04/22/18 1042)  iopamidol (ISOVUE-300) 61 % injection 100 mL (100 mLs Intravenous Contrast Given 04/22/18 1049)  HYDROmorphone (DILAUDID) injection 0.5 mg (0.5 mg Intravenous Given 04/22/18 1148)  LORazepam (ATIVAN) injection 0.5 mg (0.5 mg Intravenous Given 04/22/18 1147)     Initial Impression / Assessment and Plan / ED Course  I have reviewed the triage vital signs and the nursing notes.  Pertinent labs & imaging results that were available during my care of the patient were reviewed by me and considered in my medical decision making (see chart for details).     Patient with abdominal pain possible pancreatitis or duodenitis.  He  will be started on some Protonix and given Ultram and Zofran.  Patient will follow up with GI and his family doctor if he has any more problems he is to return to the emergency department  Final Clinical Impressions(s) / ED Diagnoses   Final diagnoses:  Pain of upper abdomen    ED Discharge Orders         Ordered    pantoprazole (PROTONIX) 20 MG tablet  Daily     04/22/18 1301    ondansetron (ZOFRAN ODT) 4 MG disintegrating tablet     04/22/18 1301    traMADol (ULTRAM) 50 MG tablet  Every 6 hours PRN     04/22/18 1301           Milton Ferguson, MD 04/22/18 1307

## 2018-04-22 NOTE — ED Triage Notes (Signed)
Pt reports ongoing vomiting since last night.  States he was seen here and d/c with "nausea med" which he took 1 hour ago with no relief.

## 2018-04-23 MED FILL — Ondansetron HCl Tab 4 MG: ORAL | Qty: 4 | Status: AC

## 2018-05-03 NOTE — ED Provider Notes (Signed)
[facility] EMERGENCY DEPARTMENT Provider Note   CSN: [id number] Arrival date & time: 04/21/18  1812     History   Chief Complaint Chief Complaint  Patient presents with  . Abdominal Pain    HPI Bryan Fleming is a 57 y.o. male.  HPI   57 year old male with abdominal pain and nausea/vomiting.  Onset last night.  Persistent since then.  No diarrhea.  Subjectivefevers.  He has been taking antacids without improvement.  He has not tried anything else.  No sick contacts.  He does drink alcohol on a regular basis.  Denies frequent NSAID usage.  Past Medical History:  Diagnosis Date  . COPD (chronic obstructive pulmonary disease) ([place]) 2007   EMPHYSEMA  . GERD (gastroesophageal reflux disease) 2010  . Hepatitis    Alcoholic   . Hypertension     Patient Active Problem List   Diagnosis Date Noted  . Elevated liver function tests 02/02/2016  . 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 Alcohol abuse 08/30/2015  . Anxiety state 08/30/2015  . Nicotine dependence, cigarettes, with other nicotine-induced disorders 08/30/2015  . Hepatitis 08/31/2011  . Hypertension   . GERD (gastroesophageal reflux disease)   . COPD (chronic obstructive pulmonary disease) ([place])     Past Surgical History:  Procedure Laterality Date  . CARDIAC CATHETERIZATION     4-5 YEARS AGO  . COLONOSCOPY  03/14/2012   Procedure: COLONOSCOPY;  Surgeon: Rogene Houston, MD;  Location: [facility];  Service: Endoscopy;  Laterality: N/A;  100  . WRIST GANGLION EXCISION  1985        Home Medications    Prior to Admission medications   Medication Sig Start Date End Date Taking? Authorizing Provider  aspirin 81 MG tablet Take 81 mg by mouth daily.   Yes [provider]  citalopram (CELEXA) 20 MG tablet Take 20 mg by mouth daily.   Yes [provider]  diphenhydrAMINE (BENADRYL) 25 MG tablet Take 25 mg by mouth daily as needed for itching or allergies.    Yes [provider]    loperamide (IMODIUM A-D) 2 MG tablet Take 2 mg by mouth daily as needed for diarrhea or loose stools.   Yes [provider]  metoprolol tartrate (LOPRESSOR) 100 MG tablet TAKE 1 Tablet  BY MOUTH TWICE DAILY Patient taking differently: Take 100 mg by mouth 2 (two) times daily.  11/13/17  Yes Soyla Dryer, PA-C  mometasone-formoterol (DULERA) 200-5 MCG/ACT AERO Inhale 2 puffs into the lungs 2 (two) times daily. 08/16/17  Yes Soyla Dryer, PA-C  oxymetazoline (AFRIN) 0.05 % nasal spray Place 2 sprays into both nostrils daily as needed for congestion.   Yes [provider]  PROVENTIL HFA 108 (90 Base) MCG/ACT inhaler INHALE 2 PUFFS BY MOUTH EVERY 6 HOURS AS NEEDED FOR COUGHING, WHEEZING, OR SHORTNESS OF BREATH Patient taking differently: Inhale 2 puffs into the lungs every 6 (six) hours as needed for wheezing or shortness of breath.  11/13/17  Yes Soyla Dryer, PA-C  ranitidine (ZANTAC) 150 MG tablet Take 300 mg by mouth at bedtime.    Yes [provider]  sildenafil (VIAGRA) 100 MG tablet Take 0.5-1 tablets (50-100 mg total) by mouth daily as needed for erectile dysfunction. 12/28/17  Yes Soyla Dryer, PA-C  tetrahydrozoline 0.05 % ophthalmic solution Place 1 drop into both eyes daily as needed (for dry eye).    Yes [provider]  traZODone (DESYREL) 100 MG tablet Take 200 mg by mouth at bedtime as  needed for sleep.    Yes [provider]  vitamin B-12 (CYANOCOBALAMIN) 500 MCG tablet Take 500 mcg by mouth daily.   Yes [provider]  ondansetron (ZOFRAN ODT) 4 MG disintegrating tablet 4mg  ODT q4 hours prn nausea/vomit 04/22/18   Milton Ferguson, MD  ondansetron (ZOFRAN) 4 MG tablet Take 1 tablet (4 mg total) by mouth every 8 (eight) hours as needed for nausea or vomiting. 04/21/18   Virgel Manifold, MD  pantoprazole (PROTONIX) 20 MG tablet Take 1 tablet (20 mg total) by mouth daily. 04/22/18   Milton Ferguson, MD  traMADol (ULTRAM) 50 MG  tablet Take 1 tablet (50 mg total) by mouth every 6 (six) hours as needed. 04/22/18   Milton Ferguson, MD    Family History Family History  Problem Relation Age of Onset  . Hypertension Mother   . Asthma Father   . Diabetes Sister   . Hypertension Sister   . Hypertension Brother   . Hypertension Sister   . Lupus Daughter     Social History Social History   Tobacco Use  . Smoking status: Current Every Day Smoker    Packs/day: 1.00    Years: 37.00    Pack years: 37.00    Types: Cigarettes  . Smokeless tobacco: Never Used  Substance Use Topics  . Alcohol use: Yes    Alcohol/week: 22.0 standard drinks    Types: 21 Cans of beer, 1 Standard drinks or equivalent per week    Comment: daily- last drink yesterday  . Drug use: No     Allergies   Patient has no known allergies.   Review of Systems Review of Systems  All systems reviewed and negative, other than as noted in HPI.  Physical Exam Updated Vital Signs BP (!) 187/94   Pulse 74   Temp 98.5 F (36.9 C) (Oral)   Resp 15   Ht 5\' 9"  (1.753 m)   Wt 65.8 kg   SpO2 98%   BMI 21.41 kg/m   Physical Exam  Constitutional: He appears well-developed and well-nourished. No distress.  HENT:  Head: Normocephalic and atraumatic.  Eyes: Conjunctivae are normal. Right eye exhibits no discharge. Left eye exhibits no discharge.  Neck: Neck supple.  Cardiovascular: Normal rate, regular rhythm and normal heart sounds. Exam reveals no gallop and no friction rub.  No murmur heard. Pulmonary/Chest: Effort normal and breath sounds normal. No respiratory distress.  Abdominal: Soft. He exhibits no distension. There is tenderness.  Epigastric tenderness without rebound or guarding.  No distention.  Musculoskeletal: He exhibits no edema or tenderness.  Neurological: He is alert.  Skin: Skin is warm and dry.  Psychiatric: He has a normal mood and affect. His behavior is normal. Thought content normal.  Nursing note and vitals  reviewed.    ED Treatments / Results  Labs (all labs ordered are listed, but only abnormal results are displayed) Labs Reviewed  COMPREHENSIVE METABOLIC PANEL - Abnormal; Notable for the following components:      Result Value   Glucose, Bld 123 (*)    Total Protein 8.8 (*)    Total Bilirubin 1.5 (*)    All other components within normal limits  CBC - Abnormal; Notable for the following components:   WBC 12.2 (*)    RDW 15.7 (*)    All other components within normal limits  LIPASE, BLOOD    EKG None  Radiology No results found.  Procedures Procedures (including critical care time)  Medications Ordered in ED  Medications  sodium chloride 0.9 % bolus 1,000 mL ( Intravenous Stopped 04/21/18 2008)  HYDROmorphone (DILAUDID) injection 1 mg (1 mg Intravenous Given 04/21/18 1859)  promethazine (PHENERGAN) injection 12.5 mg (12.5 mg Intravenous Given 04/21/18 1859)  famotidine (PEPCID) IVPB 20 mg premix ( Intravenous Stopped 04/21/18 1935)     Initial Impression / Assessment and Plan / ED Course  I have reviewed the triage vital signs and the nursing notes.  Pertinent labs & imaging results that were available during my care of the patient were reviewed by me and considered in my medical decision making (see chart for details).     57 year old male with epigastric pain.  Suspect gastritis.  He is treated symptomatically with much improvement.  He is now tolerating p.o.  Discharged with Zofran.  Advised to limit alcohol use.  Diet and return precautions discussed.  Final Clinical Impressions(s) / ED Diagnoses   Final diagnoses:  Non-intractable vomiting with nausea, unspecified vomiting type  Epigastric pain    ED Discharge Orders         Ordered    ondansetron (ZOFRAN) 4 MG tablet  Every 8 hours PRN,   Status:  Discontinued     04/21/18 2020    ondansetron (ZOFRAN) 4 MG tablet  Every 8 hours PRN     04/21/18 2022           Virgel Manifold, MD 05/03/18 [phone]

## 2019-03-29 ENCOUNTER — Emergency Department (HOSPITAL_COMMUNITY): Payer: Medicare Other

## 2019-03-29 ENCOUNTER — Emergency Department (HOSPITAL_COMMUNITY)
Admission: EM | Admit: 2019-03-29 | Discharge: 2019-03-29 | Disposition: A | Discharge status: Home / Self Care | Payer: Medicare Other | Attending: Emergency Medicine | Admitting: Emergency Medicine

## 2019-03-29 ENCOUNTER — Other Ambulatory Visit: Payer: Self-pay

## 2019-03-29 ENCOUNTER — Encounter (HOSPITAL_COMMUNITY): Payer: Self-pay | Admitting: Emergency Medicine

## 2019-03-29 DIAGNOSIS — J449 Chronic obstructive pulmonary disease, unspecified: Secondary | ICD-10-CM | POA: Insufficient documentation

## 2019-03-29 DIAGNOSIS — R109 Unspecified abdominal pain: Secondary | ICD-10-CM | POA: Insufficient documentation

## 2019-03-29 DIAGNOSIS — R112 Nausea with vomiting, unspecified: Secondary | ICD-10-CM | POA: Diagnosis not present

## 2019-03-29 DIAGNOSIS — I10 Essential (primary) hypertension: Secondary | ICD-10-CM | POA: Insufficient documentation

## 2019-03-29 DIAGNOSIS — R197 Diarrhea, unspecified: Secondary | ICD-10-CM | POA: Insufficient documentation

## 2019-03-29 DIAGNOSIS — F17210 Nicotine dependence, cigarettes, uncomplicated: Secondary | ICD-10-CM | POA: Diagnosis not present

## 2019-03-29 DIAGNOSIS — Z79899 Other long term (current) drug therapy: Secondary | ICD-10-CM | POA: Insufficient documentation

## 2019-03-29 DIAGNOSIS — Z20828 Contact with and (suspected) exposure to other viral communicable diseases: Secondary | ICD-10-CM | POA: Diagnosis not present

## 2019-03-29 DIAGNOSIS — F10129 Alcohol abuse with intoxication, unspecified: Secondary | ICD-10-CM | POA: Diagnosis not present

## 2019-03-29 DIAGNOSIS — K209 Esophagitis, unspecified without bleeding: Secondary | ICD-10-CM

## 2019-03-29 DIAGNOSIS — F1010 Alcohol abuse, uncomplicated: Secondary | ICD-10-CM

## 2019-03-29 DIAGNOSIS — J029 Acute pharyngitis, unspecified: Secondary | ICD-10-CM | POA: Diagnosis present

## 2019-03-29 DIAGNOSIS — Y901 Blood alcohol level of 20-39 mg/100 ml: Secondary | ICD-10-CM | POA: Diagnosis not present

## 2019-03-29 DIAGNOSIS — J189 Pneumonia, unspecified organism: Secondary | ICD-10-CM | POA: Diagnosis not present

## 2019-03-29 DIAGNOSIS — Z7982 Long term (current) use of aspirin: Secondary | ICD-10-CM | POA: Diagnosis not present

## 2019-03-29 LAB — URINALYSIS, ROUTINE W REFLEX MICROSCOPIC
Bilirubin Urine: NEGATIVE
Glucose, UA: 50 mg/dL — AB
Ketones, ur: 5 mg/dL — AB
Leukocytes,Ua: NEGATIVE
Nitrite: NEGATIVE
Protein, ur: 100 mg/dL — AB
Specific Gravity, Urine: 1.02 (ref 1.005–1.030)
pH: 5 (ref 5.0–8.0)

## 2019-03-29 LAB — CBC WITH DIFFERENTIAL/PLATELET
Abs Immature Granulocytes: 0.02 10*3/uL (ref 0.00–0.07)
Basophils Absolute: 0 10*3/uL (ref 0.0–0.1)
Basophils Relative: 1 %
Eosinophils Absolute: 0 10*3/uL (ref 0.0–0.5)
Eosinophils Relative: 0 %
HCT: 49.9 % (ref 39.0–52.0)
Hemoglobin: 16.5 g/dL (ref 13.0–17.0)
Immature Granulocytes: 0 %
Lymphocytes Relative: 19 %
Lymphs Abs: 1.4 10*3/uL (ref 0.7–4.0)
MCH: 31.8 pg (ref 26.0–34.0)
MCHC: 33.1 g/dL (ref 30.0–36.0)
MCV: 96.1 fL (ref 80.0–100.0)
Monocytes Absolute: 0.8 10*3/uL (ref 0.1–1.0)
Monocytes Relative: 10 %
Neutro Abs: 5.3 10*3/uL (ref 1.7–7.7)
Neutrophils Relative %: 70 %
Platelets: 124 10*3/uL — ABNORMAL LOW (ref 150–400)
RBC: 5.19 MIL/uL (ref 4.22–5.81)
RDW: 14.1 % (ref 11.5–15.5)
WBC: 7.6 10*3/uL (ref 4.0–10.5)
nRBC: 0 % (ref 0.0–0.2)

## 2019-03-29 LAB — COMPREHENSIVE METABOLIC PANEL
ALT: 48 U/L — ABNORMAL HIGH (ref 0–44)
AST: 140 U/L — ABNORMAL HIGH (ref 15–41)
Albumin: 4 g/dL (ref 3.5–5.0)
Alkaline Phosphatase: 98 U/L (ref 38–126)
Anion gap: 14 (ref 5–15)
BUN: <5 mg/dL — ABNORMAL LOW (ref 6–20)
CO2: 21 mmol/L — ABNORMAL LOW (ref 22–32)
Calcium: 9 mg/dL (ref 8.9–10.3)
Chloride: 102 mmol/L (ref 98–111)
Creatinine, Ser: 0.65 mg/dL (ref 0.61–1.24)
GFR calc Af Amer: >60 mL/min (ref >60)
GFR calc non Af Amer: >60 mL/min (ref >60)
Glucose, Bld: 108 mg/dL — ABNORMAL HIGH (ref 70–99)
Potassium: 3.5 mmol/L (ref 3.5–5.1)
Sodium: 137 mmol/L (ref 135–145)
Total Bilirubin: 1.4 mg/dL — ABNORMAL HIGH (ref 0.3–1.2)
Total Protein: 8.3 g/dL — ABNORMAL HIGH (ref 6.5–8.1)

## 2019-03-29 LAB — SARS CORONAVIRUS 2 BY RT PCR (HOSPITAL ORDER, PERFORMED IN ~~LOC~~ HOSPITAL LAB): SARS Coronavirus 2: NEGATIVE

## 2019-03-29 LAB — ETHANOL: Alcohol, Ethyl (B): 31 mg/dL — ABNORMAL HIGH (ref <10)

## 2019-03-29 LAB — GROUP A STREP BY PCR: Group A Strep by PCR: NOT DETECTED

## 2019-03-29 LAB — TROPONIN I (HIGH SENSITIVITY)
Troponin I (High Sensitivity): 5 ng/L (ref <18)
Troponin I (High Sensitivity): 4 ng/L (ref <18)

## 2019-03-29 LAB — LACTIC ACID, PLASMA
Lactic Acid, Venous: 2.5 mmol/L (ref 0.5–1.9)
Lactic Acid, Venous: 1.6 mmol/L (ref 0.5–1.9)

## 2019-03-29 LAB — LIPASE, BLOOD: Lipase: 26 U/L (ref 11–51)

## 2019-03-29 IMAGING — CT CT ABDOMEN AND PELVIS WITH CONTRAST
2 of 5 series · 15 of 46 positions shown, 17 images · IV contrast (Isovue)
Comparison: [DATE]

CLINICAL DATA: Abdominal pain

EXAM:
CT ABDOMEN AND PELVIS WITH CONTRAST
TECHNIQUE: Multidetector CT imaging of the abdomen and pelvis was performed
using the standard protocol following bolus administration of
intravenous contrast.
CONTRAST:  100mL OMNIPAQUE IOHEXOL 300 MG/ML  SOLN

[Series 2: axial st · axial · 0.69mm/px · z∈[+599,+1019]mm · 12 of 100 slices shown, 14 images]
[im 8/100  soft-tissue]
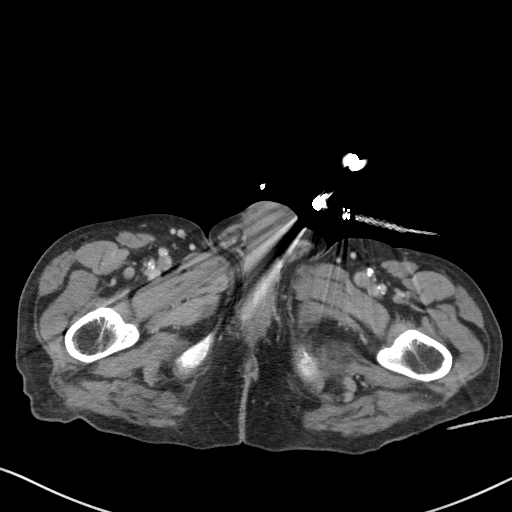
[im 8/100  bone]
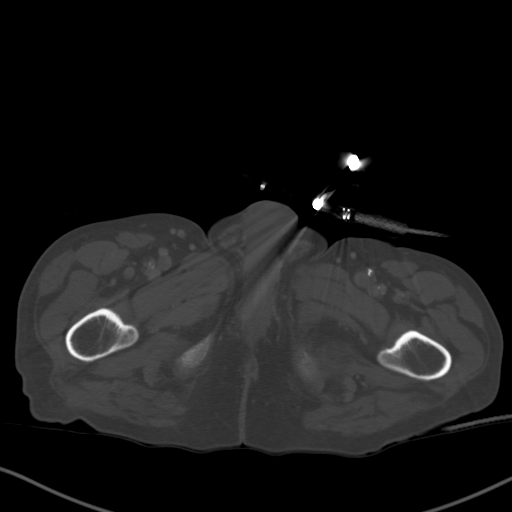
[im 16/100  soft-tissue]
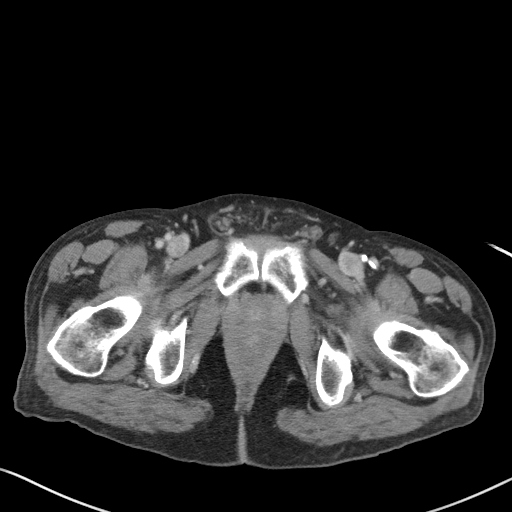
[im 23/100  soft-tissue]
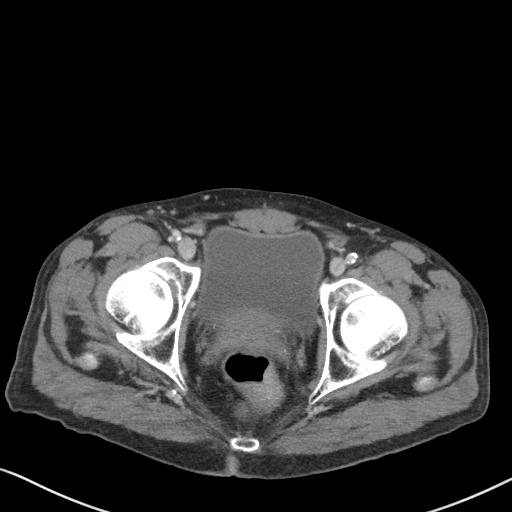
[im 31/100  soft-tissue]
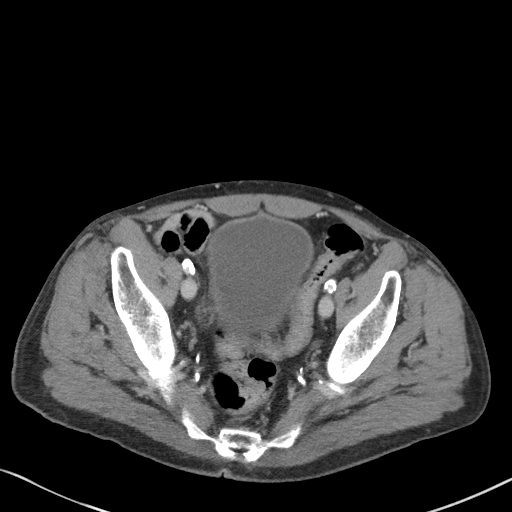
[im 39/100  soft-tissue]
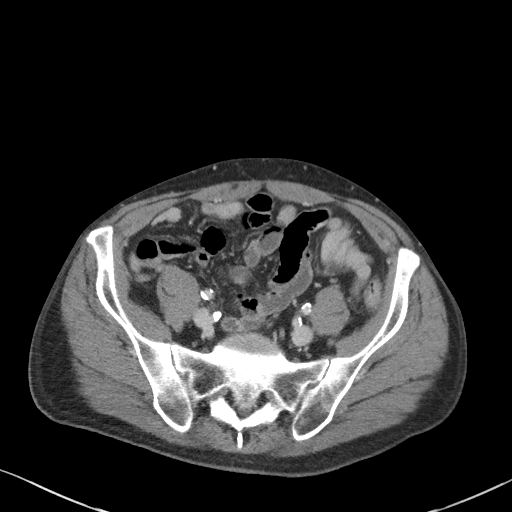
[im 46/100  soft-tissue]
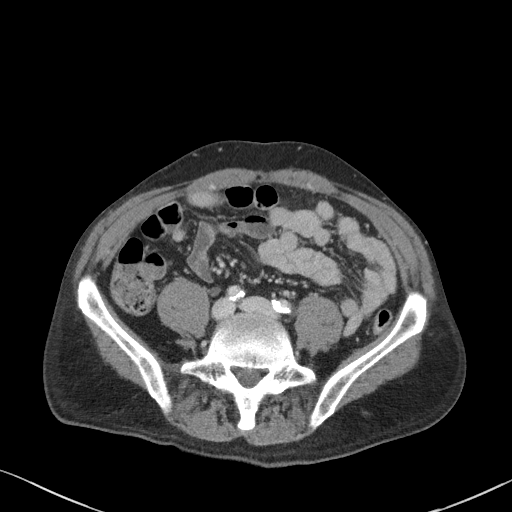
[im 54/100  soft-tissue]
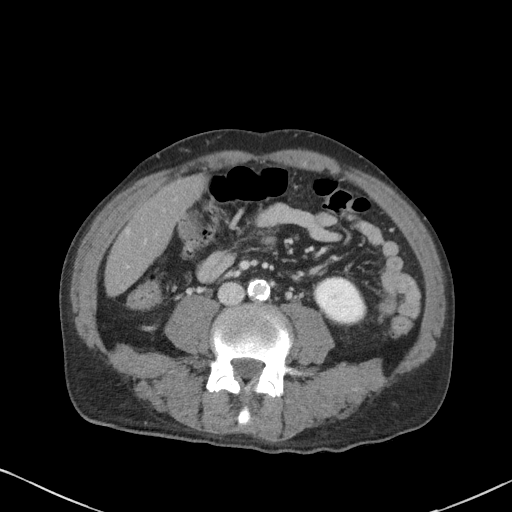
[im 61/100  soft-tissue]
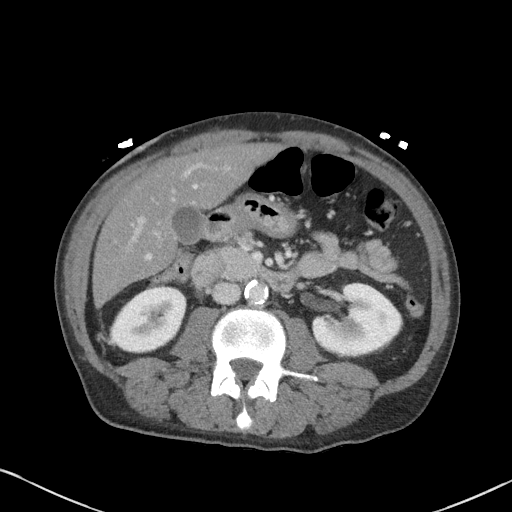
[im 69/100  soft-tissue]
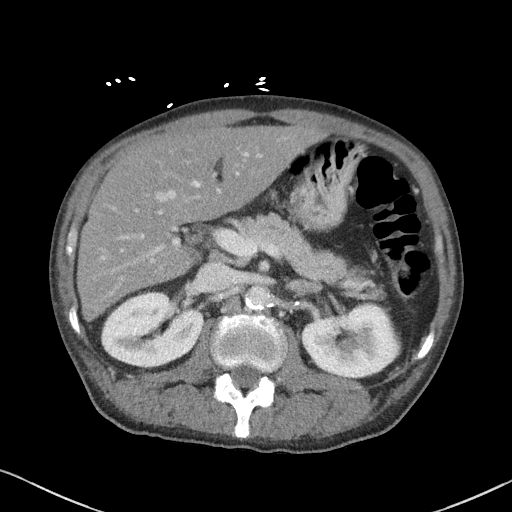
[im 69/100  bone]
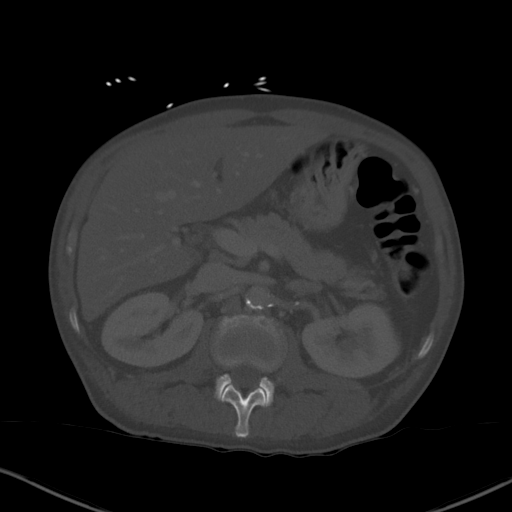
[im 77/100  soft-tissue]
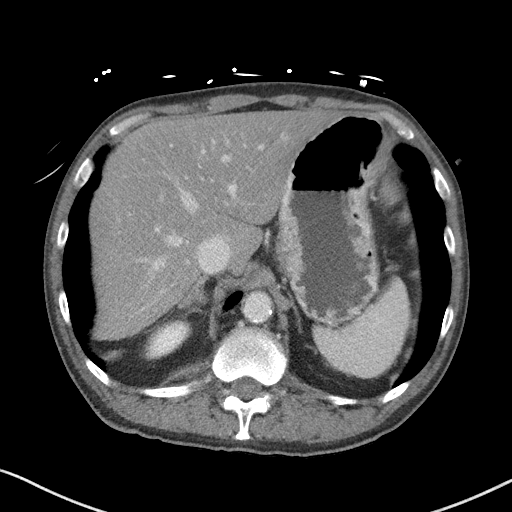
[im 84/100  soft-tissue]
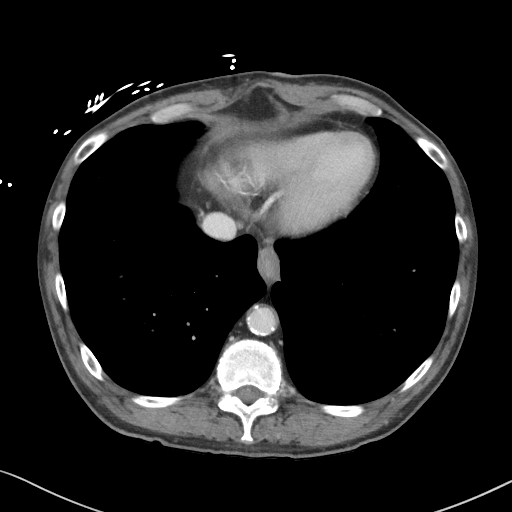
[im 92/100  soft-tissue]
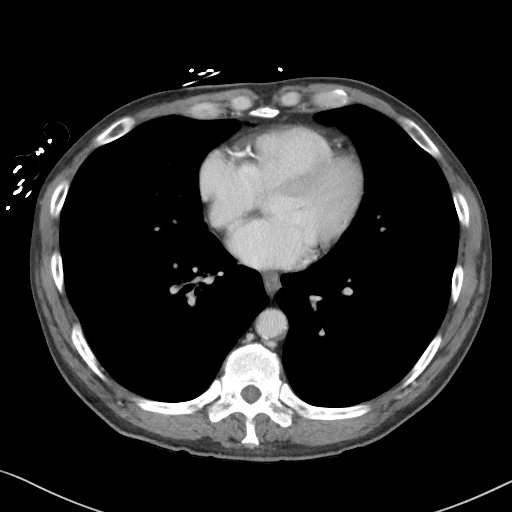

[Series 6: coronal st · coronal · 0.74mm/px · 3 of 99 slices shown]
[im 33/99  soft-tissue]
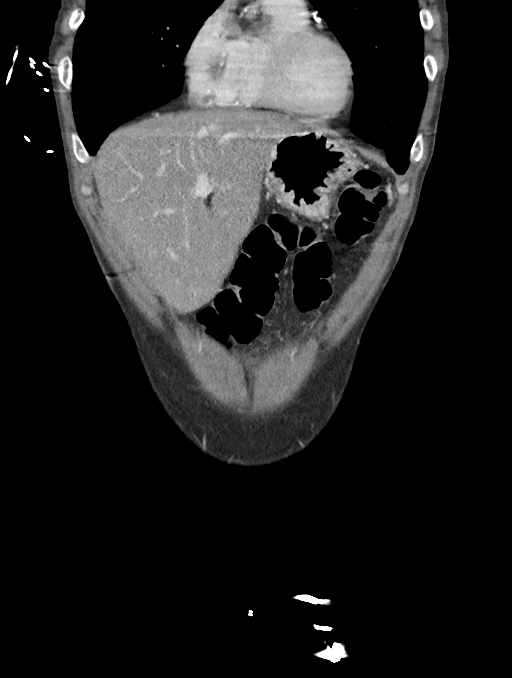
[im 44/99  soft-tissue]
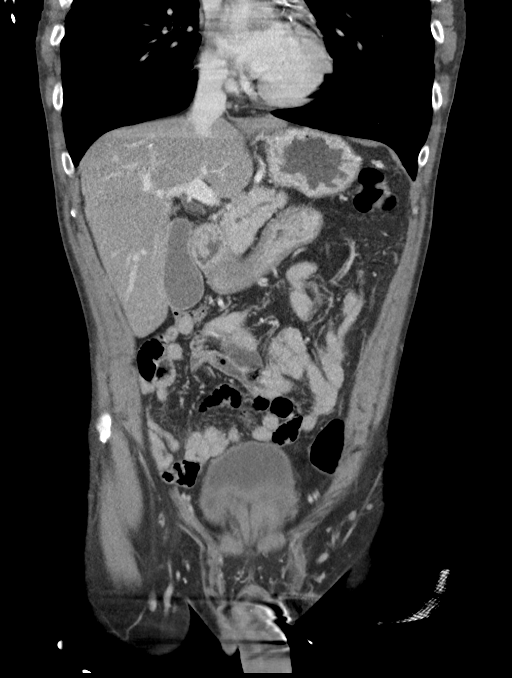
[im 55/99  soft-tissue]
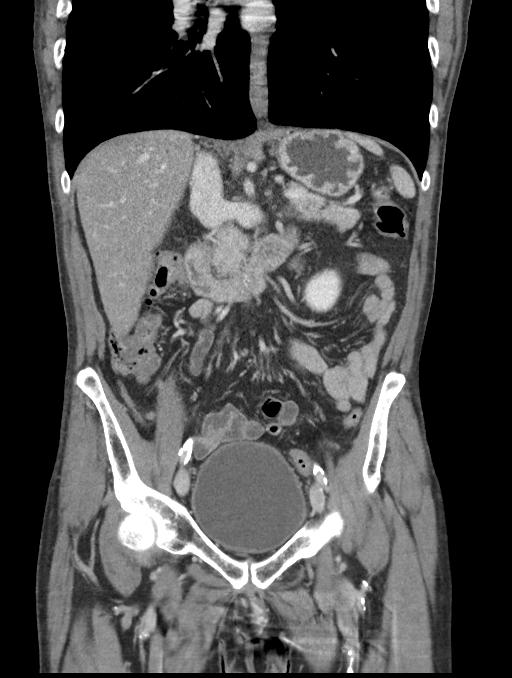

[15 of 46 positions shown; findings below may reference images not displayed]

FINDINGS: Lower chest: There is an airspace opacity at the right lung base
favored to represent atelectasis.The heart size is normal.

Hepatobiliary: The liver is normal. Normal gallbladder.There is no
biliary ductal dilation.

Pancreas: Normal contours without ductal dilatation. No
peripancreatic fluid collection.

Spleen: No splenic laceration or hematoma.

Adrenals/Urinary Tract:

--Adrenal glands: No adrenal hemorrhage.

--Right kidney/ureter: There are few calcifications involving the
right kidney that are favored to represent vascular calcifications.
There is no right-sided hydronephrosis.

--Left kidney/ureter: There is a punctate calcification involving
the left kidney which may represent a vascular calcification versus
a nonobstructing stone. There is no hydronephrosis.

--Urinary bladder: Unremarkable.

Stomach/Bowel:

--Stomach/Duodenum: There is wall thickening of the esophagus.

--Small bowel: No dilatation or inflammation.

--Colon: No focal abnormality.

--Appendix: Normal.

Vascular/Lymphatic: Atherosclerotic calcification is present within
the non-aneurysmal abdominal aorta, without hemodynamically
significant stenosis

--No retroperitoneal lymphadenopathy.

--No mesenteric lymphadenopathy.

--No pelvic or inguinal lymphadenopathy.

Reproductive: The prostate gland is enlarged.

Other: No ascites or free air. The abdominal wall is normal.

Musculoskeletal. No acute displaced fractures.
IMPRESSION: 1. Mild wall thickening of the distal esophagus which can be seen in
patients with esophagitis.
2. Airspace opacity at the right lung base favored to represent
atelectasis with infiltrate not entirely excluded.
3.  Aortic Atherosclerosis ([NT]-[NT]).

## 2019-03-29 IMAGING — CR PORTABLE CHEST - 1 VIEW
2 series · 2 of 2 positions shown · non-contrast
Comparison: [DATE]

CLINICAL DATA: Pt c/o abdominal pain, n/v/d, fever, cough, and sore
throat x2 days. Denies COVID exposure. Hx COPD, HTN, Hepatitis.

EXAM:
PORTABLE CHEST 1 VIEW

[portable (1 of 2)]
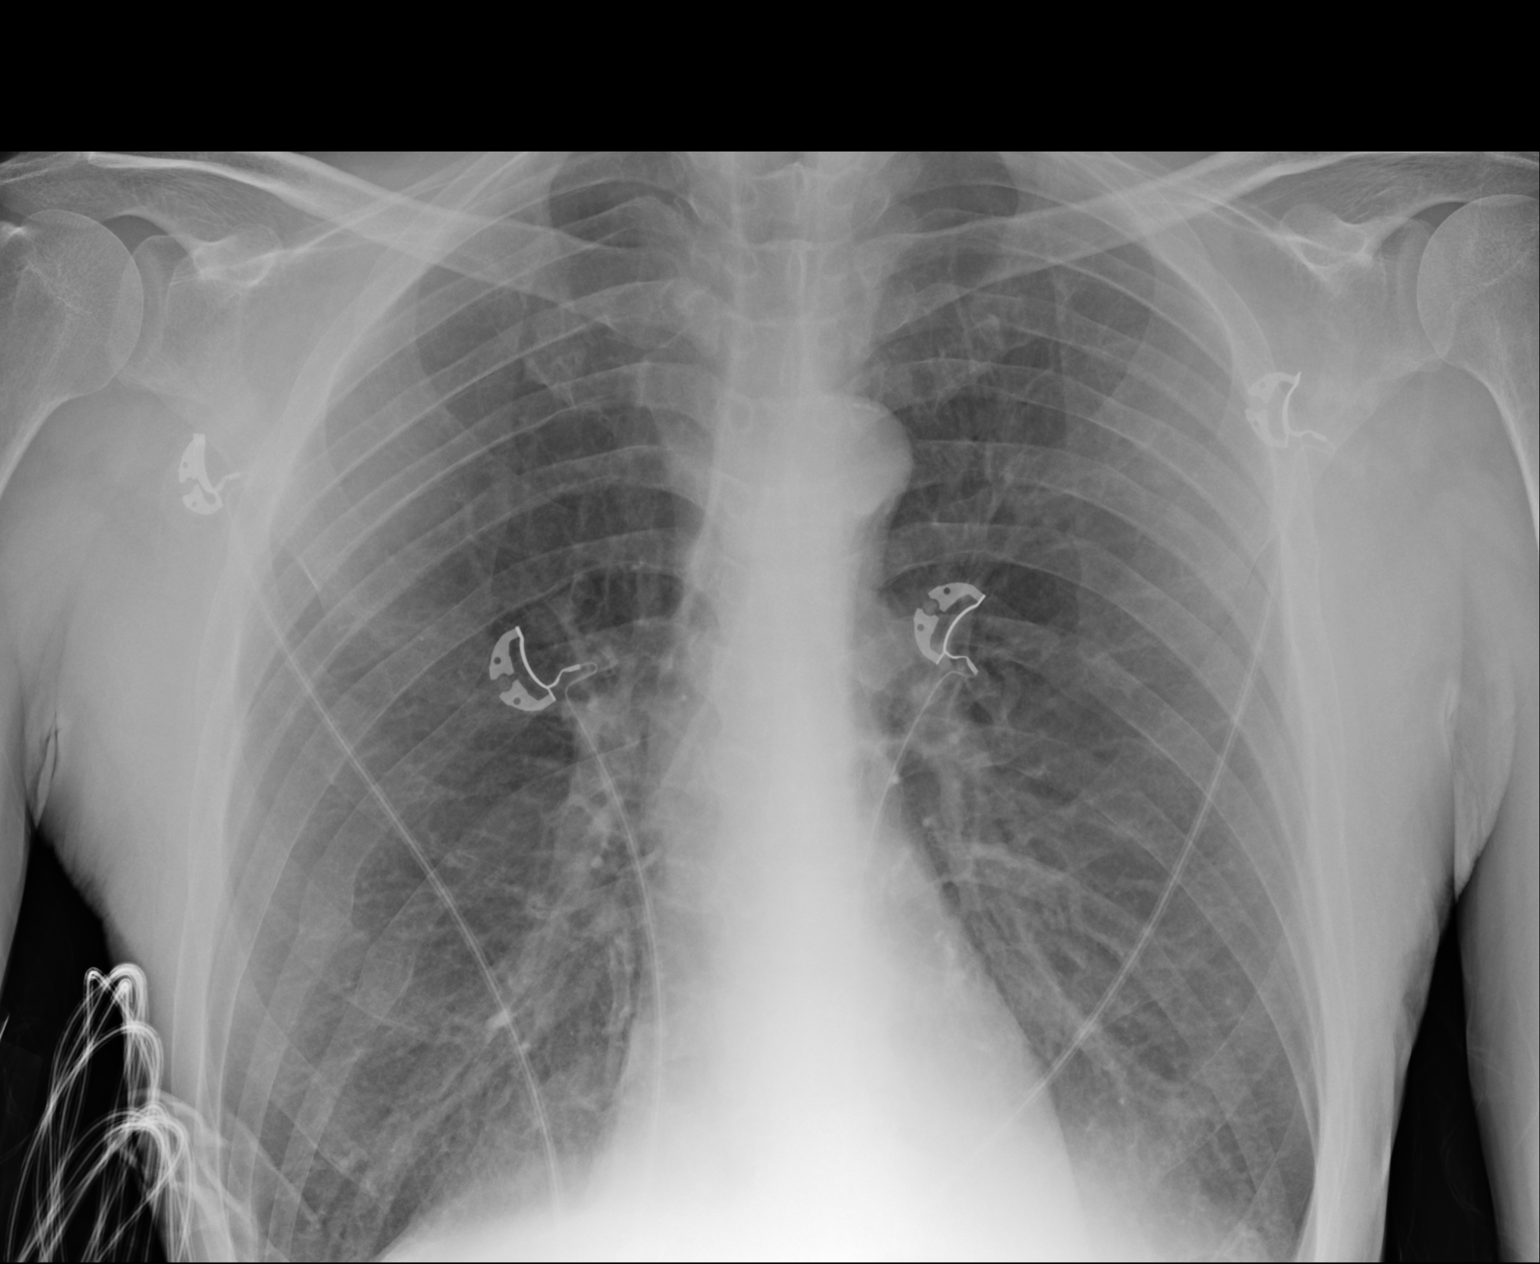

[portable (2 of 2)]
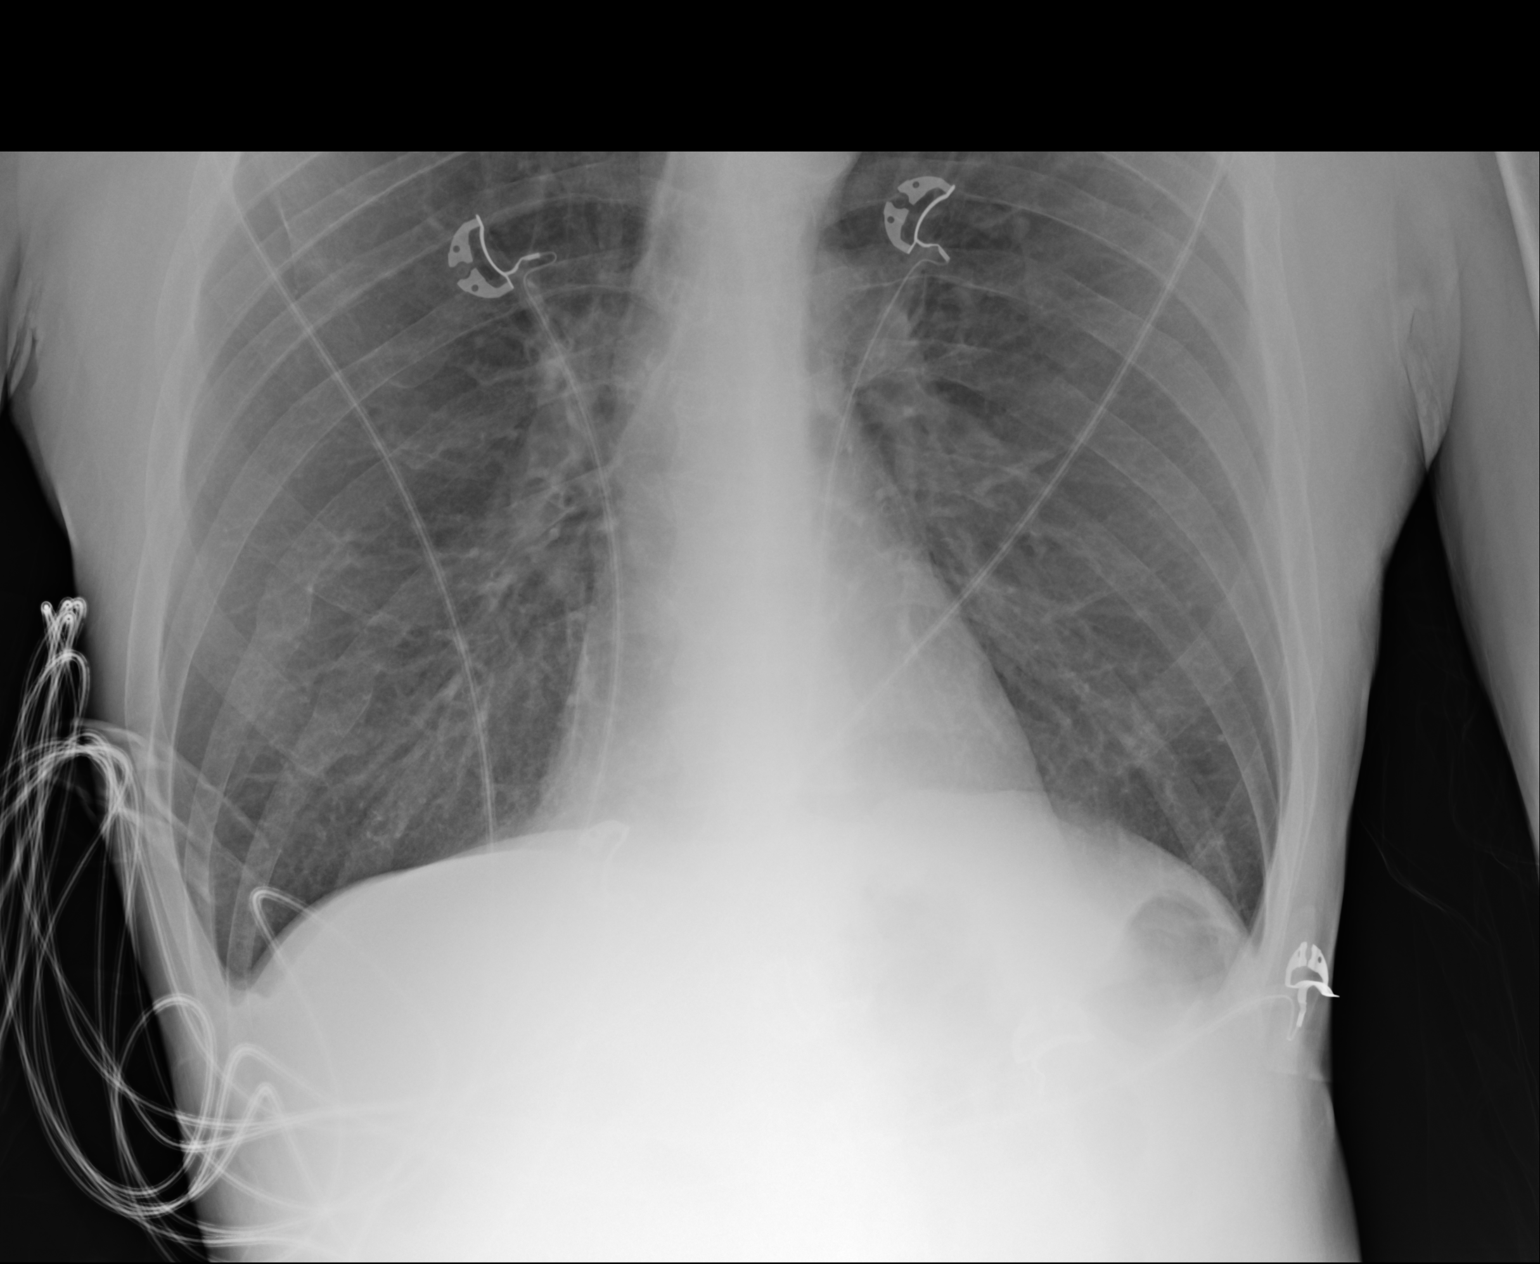

[2 of 2 positions shown; findings below may reference images not displayed]

FINDINGS: Lungs are hyperinflated. Heart size is normal. There are no focal
consolidations or pleural effusions. No pulmonary edema. Remote rib
fractures.
IMPRESSION: 1. Hyperinflation.
2. No evidence for acute cardiopulmonary abnormality.

## 2019-03-29 MED ORDER — SODIUM CHLORIDE 0.9 % IV BOLUS
1000.0000 mL | Freq: Once | INTRAVENOUS | Status: AC
  Administered 2019-03-29: 1000 mL via INTRAVENOUS

## 2019-03-29 MED ORDER — THIAMINE HCL 100 MG/ML IJ SOLN: 100.0000 mg | Freq: Every day | INTRAMUSCULAR | Status: DC

## 2019-03-29 MED ORDER — PANTOPRAZOLE SODIUM 40 MG IV SOLR
40.0000 mg | Freq: Once | INTRAVENOUS | Status: AC
  Administered 2019-03-29: 40 mg via INTRAVENOUS
  Filled 2019-03-29: qty 40

## 2019-03-29 MED ORDER — SODIUM CHLORIDE 0.9 % IV SOLN: 500.0000 mg | INTRAVENOUS | Status: DC

## 2019-03-29 MED ORDER — VITAMIN B-1 100 MG PO TABS
100.0000 mg | ORAL_TABLET | Freq: Every day | ORAL | Status: DC
  Administered 2019-03-29: 100 mg via ORAL
  Filled 2019-03-29: qty 1

## 2019-03-29 MED ORDER — LORAZEPAM 1 MG PO TABS
0.0000 mg | ORAL_TABLET | Freq: Four times a day (QID) | ORAL | Status: DC
  Administered 2019-03-29: 2 mg via ORAL
  Filled 2019-03-29: qty 2

## 2019-03-29 MED ORDER — DOXYCYCLINE HYCLATE 100 MG PO TABS: 100.0000 mg | ORAL_TABLET | Freq: Two times a day (BID) | ORAL | 0 refills | Status: DC

## 2019-03-29 MED ORDER — IOHEXOL 300 MG/ML  SOLN
100.0000 mL | Freq: Once | INTRAMUSCULAR | Status: AC | PRN
  Administered 2019-03-29: 20:00:00 100 mL via INTRAVENOUS

## 2019-03-29 MED ORDER — LORAZEPAM 2 MG/ML IJ SOLN: 0.0000 mg | Freq: Four times a day (QID) | INTRAMUSCULAR | Status: DC

## 2019-03-29 MED ORDER — LORAZEPAM 1 MG PO TABS: 0.0000 mg | ORAL_TABLET | Freq: Two times a day (BID) | ORAL | Status: DC

## 2019-03-29 MED ORDER — SODIUM CHLORIDE 0.9 % IV SOLN: 2.0000 g | INTRAVENOUS | Status: DC

## 2019-03-29 MED ORDER — FAMOTIDINE 20 MG PO TABS: 20.0000 mg | ORAL_TABLET | Freq: Two times a day (BID) | ORAL | 0 refills | Status: DC

## 2019-03-29 MED ORDER — LORAZEPAM 2 MG/ML IJ SOLN: 0.0000 mg | Freq: Two times a day (BID) | INTRAMUSCULAR | Status: DC

## 2019-03-29 NOTE — Discharge Instructions (Addendum)
Eat a bland diet, avoiding greasy, fatty, fried foods, as well as spicy and acidic foods or beverages.  Avoid eating within 2 to 3 hours before going to bed or laying down.  Also avoid teas, colas, coffee, chocolate, pepermint and spearment.  Take the prescriptions as directed.  May also take over the counter maalox/mylanta, as directed on packaging, as needed for discomfort.  Call your regular medical doctor on Monday to schedule a follow up appointment in the next 3 days. Call your GI doctor on Monday to schedule a follow up appointment this week.  Return to the Emergency Department immediately if worsening.

## 2019-03-29 NOTE — ED Triage Notes (Signed)
Pt c/o abdominal pain, n/v/d, fever, cough, and sore throat x2 days. Denies COVID exposure.

## 2019-03-29 NOTE — ED Provider Notes (Signed)
[facility] EMERGENCY DEPARTMENT Provider Note   CSN: [id number] Arrival date & time: 03/29/19  1609     History   Chief Complaint Chief Complaint  Patient presents with   Sore Throat    HPI Bryan Fleming is a 58 y.o. male.     HPI. Pt was seen at 1640. Per pt, c/o gradual onset and worsening of persistent multiple symptoms for the past 3 days. Symptoms include: abd pain, N/V/D, sore throat, cough. Pt states he drinks etoh daily and "had some" this morning, but feels "shaky" at this time. Denies CP/palpitations, no SOB/cough, no back pain, no black or blood in stools or emesis, no fevers, no rash.     Past Medical History:  Diagnosis Date   COPD (chronic obstructive pulmonary disease) ([place]) 2007   EMPHYSEMA   GERD (gastroesophageal reflux disease) 2010   Hepatitis    Alcoholic    Hypertension     Patient Active Problem List   Diagnosis Date Noted   Elevated liver function tests 02/02/2016   Absolute anemia 02/02/2016   Alcohol abuse 08/30/2015   Anxiety state 08/30/2015   Nicotine dependence, cigarettes, with other nicotine-induced disorders 08/30/2015   Hepatitis 08/31/2011   Hypertension    GERD (gastroesophageal reflux disease)    COPD (chronic obstructive pulmonary disease) ([place])     Past Surgical History:  Procedure Laterality Date   CARDIAC CATHETERIZATION     4-5 YEARS AGO   COLONOSCOPY  03/14/2012   Procedure: COLONOSCOPY;  Surgeon: Rogene Houston, MD;  Location: [facility];  Service: Endoscopy;  Laterality: N/A;  [place] Medications    Prior to Admission medications   Medication Sig Start Date End Date Taking? Authorizing Provider  aspirin 81 MG tablet Take 81 mg by mouth daily.    [provider]  citalopram (CELEXA) 20 MG tablet Take 20 mg by mouth daily.    [provider]  diphenhydrAMINE (BENADRYL) 25 MG tablet Take 25 mg by mouth daily as needed for  itching or allergies.     [provider]  loperamide (IMODIUM A-D) 2 MG tablet Take 2 mg by mouth daily as needed for diarrhea or loose stools.    [provider]  metoprolol tartrate (LOPRESSOR) 100 MG tablet TAKE 1 Tablet  BY MOUTH TWICE DAILY Patient taking differently: Take 100 mg by mouth 2 (two) times daily.  11/13/17   Soyla Dryer, PA-C  mometasone-formoterol (DULERA) 200-5 MCG/ACT AERO Inhale 2 puffs into the lungs 2 (two) times daily. 08/16/17   Soyla Dryer, PA-C  ondansetron (ZOFRAN ODT) 4 MG disintegrating tablet 4mg  ODT q4 hours prn nausea/vomit 04/22/18   Milton Ferguson, MD  ondansetron (ZOFRAN) 4 MG tablet Take 1 tablet (4 mg total) by mouth every 8 (eight) hours as needed for nausea or vomiting. 04/21/18   Virgel Manifold, MD  oxymetazoline (AFRIN) 0.05 % nasal spray Place 2 sprays into both nostrils daily as needed for congestion.    [provider]  pantoprazole (PROTONIX) 20 MG tablet Take 1 tablet (20 mg total) by mouth daily. 04/22/18   Milton Ferguson, MD  PROVENTIL HFA 108 [phone] Base) MCG/ACT inhaler INHALE 2 PUFFS BY MOUTH EVERY 6 HOURS AS NEEDED FOR COUGHING, WHEEZING, OR SHORTNESS OF BREATH Patient taking differently: Inhale 2 puffs into the lungs every 6 (six) hours as needed for wheezing or shortness of breath.  11/13/17   Soyla Dryer,  PA-C  ranitidine (ZANTAC) 150 MG tablet Take 300 mg by mouth at bedtime.     [provider]  sildenafil (VIAGRA) 100 MG tablet Take 0.5-1 tablets (50-100 mg total) by mouth daily as needed for erectile dysfunction. 12/28/17   Soyla Dryer, PA-C  tetrahydrozoline 0.05 % ophthalmic solution Place 1 drop into both eyes daily as needed (for dry eye).     [provider]  traMADol (ULTRAM) 50 MG tablet Take 1 tablet (50 mg total) by mouth every 6 (six) hours as needed. 04/22/18   Milton Ferguson, MD  traZODone (DESYREL) 100 MG tablet Take 200 mg by mouth at bedtime as needed for sleep.     [provider]  vitamin B-12 (CYANOCOBALAMIN) 500 MCG tablet Take 500 mcg by mouth daily.    [provider]    Family History Family History  Problem Relation Age of Onset   Hypertension Mother    Asthma Father    Diabetes Sister    Hypertension Sister    Hypertension Brother    Hypertension Sister    Lupus Daughter     Social History Social History   Tobacco Use   Smoking status: Current Every Day Smoker    Packs/day: 1.00    Years: 37.00    Pack years: 37.00    Types: Cigarettes   Smokeless tobacco: Never Used  Substance Use Topics   Alcohol use: Yes    Alcohol/week: 22.0 standard drinks    Types: 21 Cans of beer, 1 Standard drinks or equivalent per week    Comment: daily- last drink yesterday   Drug use: No     Allergies   Patient has no known allergies.   Review of Systems Review of Systems ROS: Statement: All systems negative except as marked or noted in the HPI; Constitutional: Negative for fever and chills. ; ; Eyes: Negative for eye pain, redness and discharge. ; ; ENMT: Negative for ear pain, hoarseness, nasal congestion, sinus pressure and +sore throat. ; ; Cardiovascular: Negative for chest pain, palpitations, diaphoresis, dyspnea and peripheral edema. ; ; Respiratory: +cough. Negative for wheezing and stridor. ; ; Gastrointestinal: +abd pain, N/V/D. Negative for blood in stool, hematemesis, jaundice and rectal bleeding. . ; ; Genitourinary: Negative for dysuria, flank pain and hematuria. ; ; Musculoskeletal: Negative for back pain and neck pain. Negative for swelling and trauma.; ; Skin: Negative for pruritus, rash, abrasions, blisters, bruising and skin lesion.; ; Neuro: Negative for headache, lightheadedness and neck stiffness. Negative for weakness, altered level of consciousness, altered mental status, extremity weakness, paresthesias, involuntary movement, seizure and syncope.       Physical Exam Updated Vital Signs BP (!) 160/93     Pulse (!) 115    Temp 98.8 F (37.1 C) (Oral)    Resp (!) 21    Ht 5\' 9"  (1.753 m)    Wt 65.8 kg    SpO2 99%    BMI 21.41 kg/m    Patient Vitals for the past 24 hrs:  BP Temp Temp src Pulse Resp SpO2 Height Weight  03/29/19 2100 (!) 174/91 -- -- (!) 107 -- -- -- --  03/29/19 2030 (!) 168/84 -- -- -- (!) 21 -- -- --  03/29/19 2000 (!) 155/81 -- -- -- (!) 26 -- -- --  03/29/19 1930 (!) 158/73 -- -- -- (!) 31 -- -- --  03/29/19 1900 (!) 153/83 -- -- -- (!) 28 -- -- --  03/29/19 1830 (!) 151/86 -- -- Marland Kitchen  104 (!) 24 96 % -- --  03/29/19 1800 (!) 167/87 -- -- 98 (!) 23 98 % -- --  03/29/19 1730 (!) 163/88 -- -- (!) 110 18 100 % -- --  03/29/19 1700 (!) 160/93 -- -- (!) 115 -- -- -- --  03/29/19 1700 (!) 163/90 -- -- (!) 119 (!) 21 99 % -- --  03/29/19 1630 (!) 160/93 -- -- (!) 137 (!) 23 97 % -- --  03/29/19 1624 (!) 136/93 -- -- (!) 137 (!) 30 95 % -- --  03/29/19 1616 (!) 185/93 98.8 F (37.1 C) Oral (!) 134 20 96 % -- --  03/29/19 1615 -- -- -- -- -- -- 5\' 9"  (1.753 m) 65.8 kg     Physical Exam 1645: Physical examination:  Nursing notes reviewed; Vital signs and O2 SAT reviewed;  Constitutional: Well developed, Well nourished, Well hydrated, In no acute distress; Head:  Normocephalic, atraumatic; Eyes: EOMI, PERRL, No scleral icterus; ENMT: Mouth and pharynx normal, Mucous membranes moist; Neck: Supple, Full range of motion, No lymphadenopathy; Cardiovascular: Tachycardic rate and rhythm, No gallop; Respiratory: Breath sounds clear & equal bilaterally, No wheezes.  Speaking full sentences with ease, Normal respiratory effort/excursion; Chest: Nontender, Movement normal; Abdomen: Soft, +diffuse tenderness to palp. Nondistended, Normal bowel sounds; Genitourinary: No CVA tenderness; Extremities: Peripheral pulses normal, No tenderness, No edema, No calf edema or asymmetry.; Neuro: AA&Ox3, Major CN grossly intact.  Speech clear. No gross focal motor deficits in extremities. +tremulous..; Skin:  Color normal, Warm, Dry.   ED Treatments / Results  Labs (all labs ordered are listed, but only abnormal results are displayed)   EKG EKG Interpretation  Date/Time:  Friday March 29 2019 16:23:46 EDT Ventricular Rate:  140 PR Interval:    QRS Duration: 139 QT Interval:  373 QTC Calculation: 572 R Axis:   89 Text Interpretation:  Sinus tachycardia Ventricular premature complex Prolonged PR interval Consider left atrial enlargement Nonspecific intraventricular conduction delay Anterior infarct, old Baseline wander Poor data quality When compared with ECG of 12/16/2017 Rate faster Confirmed by Francine Graven [phone]) on 03/29/2019 5:13:44 PM   Radiology   Procedures Procedures (including critical care time)  Medications Ordered in ED Medications  LORazepam (ATIVAN) injection 0-4 mg ( Intravenous See Alternative 03/29/19 1719)    Or  LORazepam (ATIVAN) tablet 0-4 mg (2 mg Oral Given 03/29/19 1719)  LORazepam (ATIVAN) injection 0-4 mg (has no administration in time range)    Or  LORazepam (ATIVAN) tablet 0-4 mg (has no administration in time range)  thiamine (VITAMIN B-1) tablet 100 mg (100 mg Oral Given 03/29/19 1719)    Or  thiamine (B-1) injection 100 mg ( Intravenous See Alternative 03/29/19 1719)  sodium chloride 0.9 % bolus 1,000 mL (has no administration in time range)  sodium chloride 0.9 % bolus 1,000 mL (1,000 mLs Intravenous New Bag/Given 03/29/19 1718)  pantoprazole (PROTONIX) injection 40 mg (40 mg Intravenous Given 03/29/19 1720)     Initial Impression / Assessment and Plan / ED Course  I have reviewed the triage vital signs and the nursing notes.  Pertinent labs & imaging results that were available during my care of the patient were reviewed by me and considered in my medical decision making (see chart for details).    MDM Reviewed: previous chart, nursing note and vitals Reviewed previous: labs and ECG Interpretation: labs, ECG, x-ray and CT  scan   Results for orders placed or performed during the hospital encounter of 03/29/19  Group  A Strep by PCR   Specimen: Throat; Sterile Swab  Result Value Ref Range   Group A Strep by PCR NOT DETECTED NOT DETECTED  SARS Coronavirus 2 [facility] order, Performed in [place] hospital lab) Nasopharyngeal   Specimen: Nasopharyngeal  Result Value Ref Range   SARS Coronavirus 2 NEGATIVE NEGATIVE  Lactic acid, plasma  Result Value Ref Range   Lactic Acid, Venous 2.5 (HH) 0.5 - 1.9 mmol/L  Lactic acid, plasma  Result Value Ref Range   Lactic Acid, Venous 1.6 0.5 - 1.9 mmol/L  Comprehensive metabolic panel  Result Value Ref Range   Sodium 137 135 - 145 mmol/L   Potassium 3.5 3.5 - 5.1 mmol/L   Chloride 102 98 - 111 mmol/L   CO2 21 (L) 22 - 32 mmol/L   Glucose, Bld 108 (H) 70 - 99 mg/dL   BUN <5 (L) 6 - 20 mg/dL   Creatinine, Ser 0.65 0.61 - 1.24 mg/dL   Calcium 9.0 8.9 - 10.3 mg/dL   Total Protein 8.3 (H) 6.5 - 8.1 g/dL   Albumin 4.0 3.5 - 5.0 g/dL   AST 140 (H) 15 - 41 U/L   ALT 48 (H) 0 - 44 U/L   Alkaline Phosphatase 98 38 - 126 U/L   Total Bilirubin 1.4 (H) 0.3 - 1.2 mg/dL   GFR calc non Af Amer >60 >60 mL/min   GFR calc Af Amer >60 >60 mL/min   Anion gap 14 5 - 15  CBC with Differential  Result Value Ref Range   WBC 7.6 4.0 - 10.5 K/uL   RBC 5.19 4.22 - 5.81 MIL/uL   Hemoglobin 16.5 13.0 - 17.0 g/dL   HCT 49.9 39.0 - 52.0 %   MCV 96.1 80.0 - 100.0 fL   MCH 31.8 26.0 - 34.0 pg   MCHC 33.1 30.0 - 36.0 g/dL   RDW 14.1 11.5 - 15.5 %   Platelets 124 (L) 150 - 400 K/uL   nRBC 0.0 0.0 - 0.2 %   Neutrophils Relative % 70 %   Neutro Abs 5.3 1.7 - 7.7 K/uL   Lymphocytes Relative 19 %   Lymphs Abs 1.4 0.7 - 4.0 K/uL   Monocytes Relative 10 %   Monocytes Absolute 0.8 0.1 - 1.0 K/uL   Eosinophils Relative 0 %   Eosinophils Absolute 0.0 0.0 - 0.5 K/uL   Basophils Relative 1 %   Basophils Absolute 0.0 0.0 - 0.1 K/uL   Immature Granulocytes 0 %   Abs Immature Granulocytes  0.02 0.00 - 0.07 K/uL  Urinalysis, Routine w reflex microscopic  Result Value Ref Range   Color, Urine AMBER (A) YELLOW   APPearance HAZY (A) CLEAR   Specific Gravity, Urine 1.020 1.005 - 1.030   pH 5.0 5.0 - 8.0   Glucose, UA 50 (A) NEGATIVE mg/dL   Hgb urine dipstick SMALL (A) NEGATIVE   Bilirubin Urine NEGATIVE NEGATIVE   Ketones, ur 5 (A) NEGATIVE mg/dL   Protein, ur 100 (A) NEGATIVE mg/dL   Nitrite NEGATIVE NEGATIVE   Leukocytes,Ua NEGATIVE NEGATIVE   RBC / HPF 6-10 0 - 5 RBC/hpf   WBC, UA 0-5 0 - 5 WBC/hpf   Bacteria, UA RARE (A) NONE SEEN   Squamous Epithelial / LPF 0-5 0 - 5   Mucus PRESENT    Hyaline Casts, UA PRESENT    Granular Casts, UA PRESENT   Lipase, blood  Result Value Ref Range   Lipase 26 11 - 51 U/L  Ethanol  Result Value Ref Range   Alcohol, Ethyl (B) 31 (H) <10 mg/dL  Troponin I (High Sensitivity)  Result Value Ref Range   Troponin I (High Sensitivity) 5 <18 ng/L  Troponin I (High Sensitivity)  Result Value Ref Range   Troponin I (High Sensitivity) 4 <18 ng/L   Dg Chest Portable 1 View Result Date: 03/29/2019 CLINICAL DATA:  Pt c/o abdominal pain, n/v/d, fever, cough, and sore throat x2 days. Denies COVID exposure. Hx COPD, HTN, Hepatitis. EXAM: PORTABLE CHEST 1 VIEW COMPARISON:  12/16/2016 FINDINGS: Lungs are hyperinflated. Heart size is normal. There are no focal consolidations or pleural effusions. No pulmonary edema. Remote rib fractures. IMPRESSION: 1. Hyperinflation. 2. No evidence for acute cardiopulmonary abnormality. Electronically Signed   By: Nolon Nations M.D.   On: 03/29/2019 17:03   Ct Abdomen Pelvis W Contrast Result Date: 03/29/2019 CLINICAL DATA:  Abdominal pain EXAM: CT ABDOMEN AND PELVIS WITH CONTRAST TECHNIQUE: Multidetector CT imaging of the abdomen and pelvis was performed using the standard protocol following bolus administration of intravenous contrast. CONTRAST:  196mL OMNIPAQUE IOHEXOL 300 MG/ML  SOLN COMPARISON:  04/22/2018  FINDINGS: Lower chest: There is an airspace opacity at the right lung base favored to represent atelectasis.The heart size is normal. Hepatobiliary: The liver is normal. Normal gallbladder.There is no biliary ductal dilation. Pancreas: Normal contours without ductal dilatation. No peripancreatic fluid collection. Spleen: No splenic laceration or hematoma. Adrenals/Urinary Tract: --Adrenal glands: No adrenal hemorrhage. --Right kidney/ureter: There are few calcifications involving the right kidney that are favored to represent vascular calcifications. There is no right-sided hydronephrosis. --Left kidney/ureter: There is a punctate calcification involving the left kidney which may represent a vascular calcification versus a nonobstructing stone. There is no hydronephrosis. --Urinary bladder: Unremarkable. Stomach/Bowel: --Stomach/Duodenum: There is wall thickening of the esophagus. --Small bowel: No dilatation or inflammation. --Colon: No focal abnormality. --Appendix: Normal. Vascular/Lymphatic: Atherosclerotic calcification is present within the non-aneurysmal abdominal aorta, without hemodynamically significant stenosis --No retroperitoneal lymphadenopathy. --No mesenteric lymphadenopathy. --No pelvic or inguinal lymphadenopathy. Reproductive: The prostate gland is enlarged. Other: No ascites or free air. The abdominal wall is normal. Musculoskeletal. No acute displaced fractures. IMPRESSION: 1. Mild wall thickening of the distal esophagus which can be seen in patients with esophagitis. 2. Airspace opacity at the right lung base favored to represent atelectasis with infiltrate not entirely excluded. 3.  Aortic Atherosclerosis (ICD10-I70.0). Electronically Signed   By: Constance Holster M.D.   On: 03/29/2019 20:41     Results for NENO, HOHENSEE (MRN [id number]) as of 03/29/2019 18:30  Ref. Range 08/16/2017 15:35 12/16/2017 08:54 04/21/2018 18:41 04/22/2018 11:17 03/29/2019 16:40  AST Latest Ref Range: 15 - 41  U/L 105 (H) 57 (H) 35 23 140 (H)  ALT Latest Ref Range: 0 - 44 U/L 48 39 21 18 48 (H)  Total Bilirubin Latest Ref Range: 0.3 - 1.2 mg/dL 0.3 1.4 (H) 1.5 (H) 1.3 (H) 1.4 (H)    Shea N Lampert was evaluated in Emergency Department on 03/29/2019 for the symptoms described in the history of present illness. He was evaluated in the context of the global COVID-19 pandemic, which necessitated consideration that the patient might be at risk for infection with the SARS-CoV-2 virus that causes COVID-19. Institutional protocols and algorithms that pertain to the evaluation of patients at risk for COVID-19 are in a state of rapid change based on information released by regulatory bodies including the CDC and federal and state organizations. These policies and algorithms were followed during the patient's care  in the ED.   2055:  Pt tremulous on arrival, ativan and IVF ordered; CIWA now 4. Pt has tol PO well without N/V. No stooling while in the ED.  Lactic acid trended down after IVF. Possible infiltrate on CXR; will dose IV abx.   2100: Pt pulling off monitor leads, tugging at IV demanding removal. Pt stating he "is leaving right now."  Pt refuses admission. Pt nformed re: dx testing results, and that I recommend admission for further evaluation.  Pt absolutely refuses admission.  I encouraged pt to stay, continues to refuse.  Pt makes his own medical decisions.  Risks of AMA explained to pt, including, but not limited to:  Seizure, sepsis, stroke, heart attack, cardiac arrythmia ("irregular heart rate/beat"), "passing out," temporary and/or permanent disability, death.  Pt verb understanding and continues to refuse admission, understanding the consequences of his decision.  I encouraged pt to follow up with his PMD on Monday and return to the ED immediately if symptoms return, or for any other concerns.  Pt verb understanding, agreeable.         Final Clinical Impressions(s) / ED Diagnoses   Final  diagnoses:  None    ED Discharge Orders    None       Francine Graven, DO 03/31/19 1547

## 2019-03-29 NOTE — ED Notes (Signed)
Pt did not want to stay in the hospital despite dr mcmanus speaking to him about concerns.  Pt walked out of the department, did not sign ama

## 2019-03-29 NOTE — ED Notes (Signed)
CRITICAL VALUE ALERT  Critical Value:  Lactic acid 2.5  Date & Time Notied:   8/21/201801  Provider Notified: mcmanus  Orders Received/Actions taken:

## 2019-04-28 ENCOUNTER — Other Ambulatory Visit: Payer: Self-pay

## 2019-04-28 ENCOUNTER — Emergency Department (HOSPITAL_COMMUNITY): Payer: Medicare Other

## 2019-04-28 ENCOUNTER — Encounter (HOSPITAL_COMMUNITY): Payer: Self-pay | Admitting: Emergency Medicine

## 2019-04-28 ENCOUNTER — Emergency Department (HOSPITAL_COMMUNITY)
Admission: EM | Admit: 2019-04-28 | Discharge: 2019-04-28 | Disposition: A | Discharge status: Home / Self Care | Payer: Medicare Other | Attending: Emergency Medicine | Admitting: Emergency Medicine

## 2019-04-28 DIAGNOSIS — R1013 Epigastric pain: Secondary | ICD-10-CM | POA: Diagnosis present

## 2019-04-28 DIAGNOSIS — Z7289 Other problems related to lifestyle: Secondary | ICD-10-CM

## 2019-04-28 DIAGNOSIS — J449 Chronic obstructive pulmonary disease, unspecified: Secondary | ICD-10-CM | POA: Insufficient documentation

## 2019-04-28 DIAGNOSIS — R112 Nausea with vomiting, unspecified: Secondary | ICD-10-CM | POA: Insufficient documentation

## 2019-04-28 DIAGNOSIS — E876 Hypokalemia: Secondary | ICD-10-CM | POA: Diagnosis not present

## 2019-04-28 DIAGNOSIS — Z7982 Long term (current) use of aspirin: Secondary | ICD-10-CM | POA: Insufficient documentation

## 2019-04-28 DIAGNOSIS — Z789 Other specified health status: Secondary | ICD-10-CM

## 2019-04-28 DIAGNOSIS — F1099 Alcohol use, unspecified with unspecified alcohol-induced disorder: Secondary | ICD-10-CM | POA: Diagnosis not present

## 2019-04-28 DIAGNOSIS — R109 Unspecified abdominal pain: Secondary | ICD-10-CM

## 2019-04-28 DIAGNOSIS — I10 Essential (primary) hypertension: Secondary | ICD-10-CM | POA: Diagnosis not present

## 2019-04-28 LAB — COMPREHENSIVE METABOLIC PANEL
ALT: 22 U/L (ref 0–44)
AST: 44 U/L — ABNORMAL HIGH (ref 15–41)
Albumin: 4.1 g/dL (ref 3.5–5.0)
Alkaline Phosphatase: 80 U/L (ref 38–126)
Anion gap: 16 — ABNORMAL HIGH (ref 5–15)
BUN: 9 mg/dL (ref 6–20)
CO2: 27 mmol/L (ref 22–32)
Calcium: 9.4 mg/dL (ref 8.9–10.3)
Chloride: 93 mmol/L — ABNORMAL LOW (ref 98–111)
Creatinine, Ser: 0.63 mg/dL (ref 0.61–1.24)
GFR calc Af Amer: >60 mL/min (ref >60)
GFR calc non Af Amer: >60 mL/min (ref >60)
Glucose, Bld: 117 mg/dL — ABNORMAL HIGH (ref 70–99)
Potassium: 2.8 mmol/L — ABNORMAL LOW (ref 3.5–5.1)
Sodium: 136 mmol/L (ref 135–145)
Total Bilirubin: 1.5 mg/dL — ABNORMAL HIGH (ref 0.3–1.2)
Total Protein: 8.4 g/dL — ABNORMAL HIGH (ref 6.5–8.1)

## 2019-04-28 LAB — ETHANOL: Alcohol, Ethyl (B): <10 mg/dL (ref <10)

## 2019-04-28 LAB — CBC
HCT: 51.5 % (ref 39.0–52.0)
Hemoglobin: 16.9 g/dL (ref 13.0–17.0)
MCH: 32.1 pg (ref 26.0–34.0)
MCHC: 32.8 g/dL (ref 30.0–36.0)
MCV: 97.7 fL (ref 80.0–100.0)
Platelets: 177 10*3/uL (ref 150–400)
RBC: 5.27 MIL/uL (ref 4.22–5.81)
RDW: 13.3 % (ref 11.5–15.5)
WBC: 8.5 10*3/uL (ref 4.0–10.5)
nRBC: 0 % (ref 0.0–0.2)

## 2019-04-28 LAB — URINALYSIS, ROUTINE W REFLEX MICROSCOPIC
Glucose, UA: NEGATIVE mg/dL
Hgb urine dipstick: NEGATIVE
Ketones, ur: 20 mg/dL — AB
Leukocytes,Ua: NEGATIVE
Nitrite: NEGATIVE
Protein, ur: >=300 mg/dL — AB
Specific Gravity, Urine: 1.025 (ref 1.005–1.030)
pH: 7 (ref 5.0–8.0)

## 2019-04-28 LAB — TROPONIN I (HIGH SENSITIVITY): Troponin I (High Sensitivity): 3 ng/L (ref <18)

## 2019-04-28 LAB — MAGNESIUM: Magnesium: 2.3 mg/dL (ref 1.7–2.4)

## 2019-04-28 LAB — LIPASE, BLOOD: Lipase: 72 U/L — ABNORMAL HIGH (ref 11–51)

## 2019-04-28 IMAGING — DX DG ABDOMEN ACUTE W/ 1V CHEST
3 series · 3 of 3 positions shown · non-contrast
Comparison: [DATE], [DATE]

CLINICAL DATA: Abdominal pain, acid reflux

EXAM:
DG ABDOMEN ACUTE W/ 1V CHEST

[chest pa]
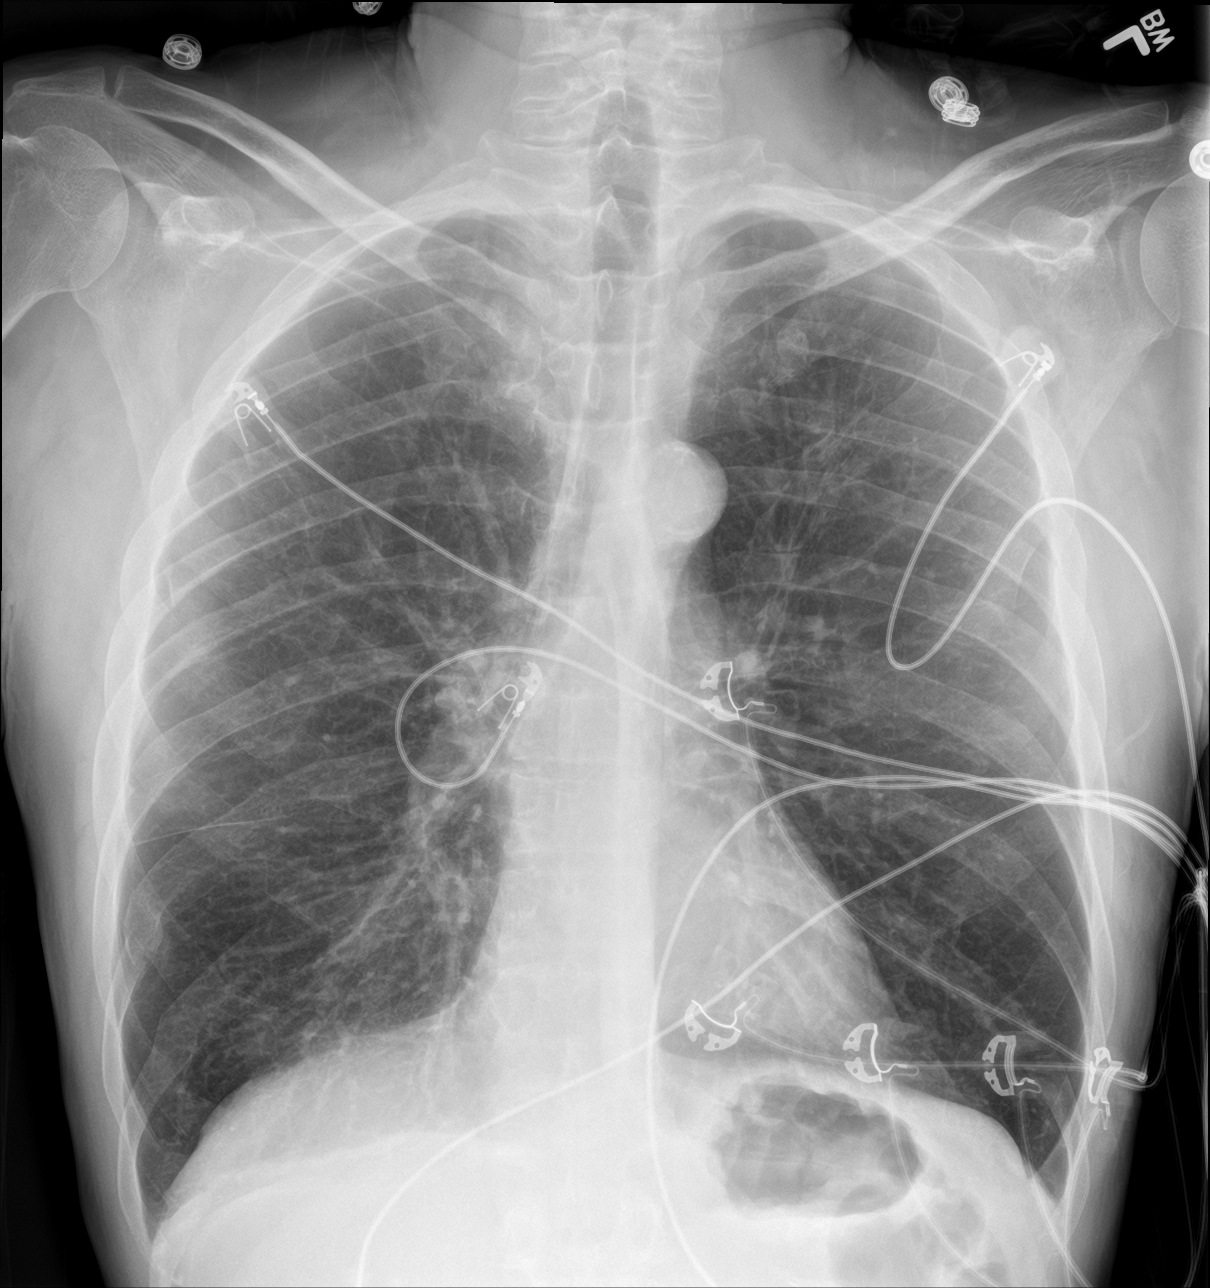

[abdomen erect]
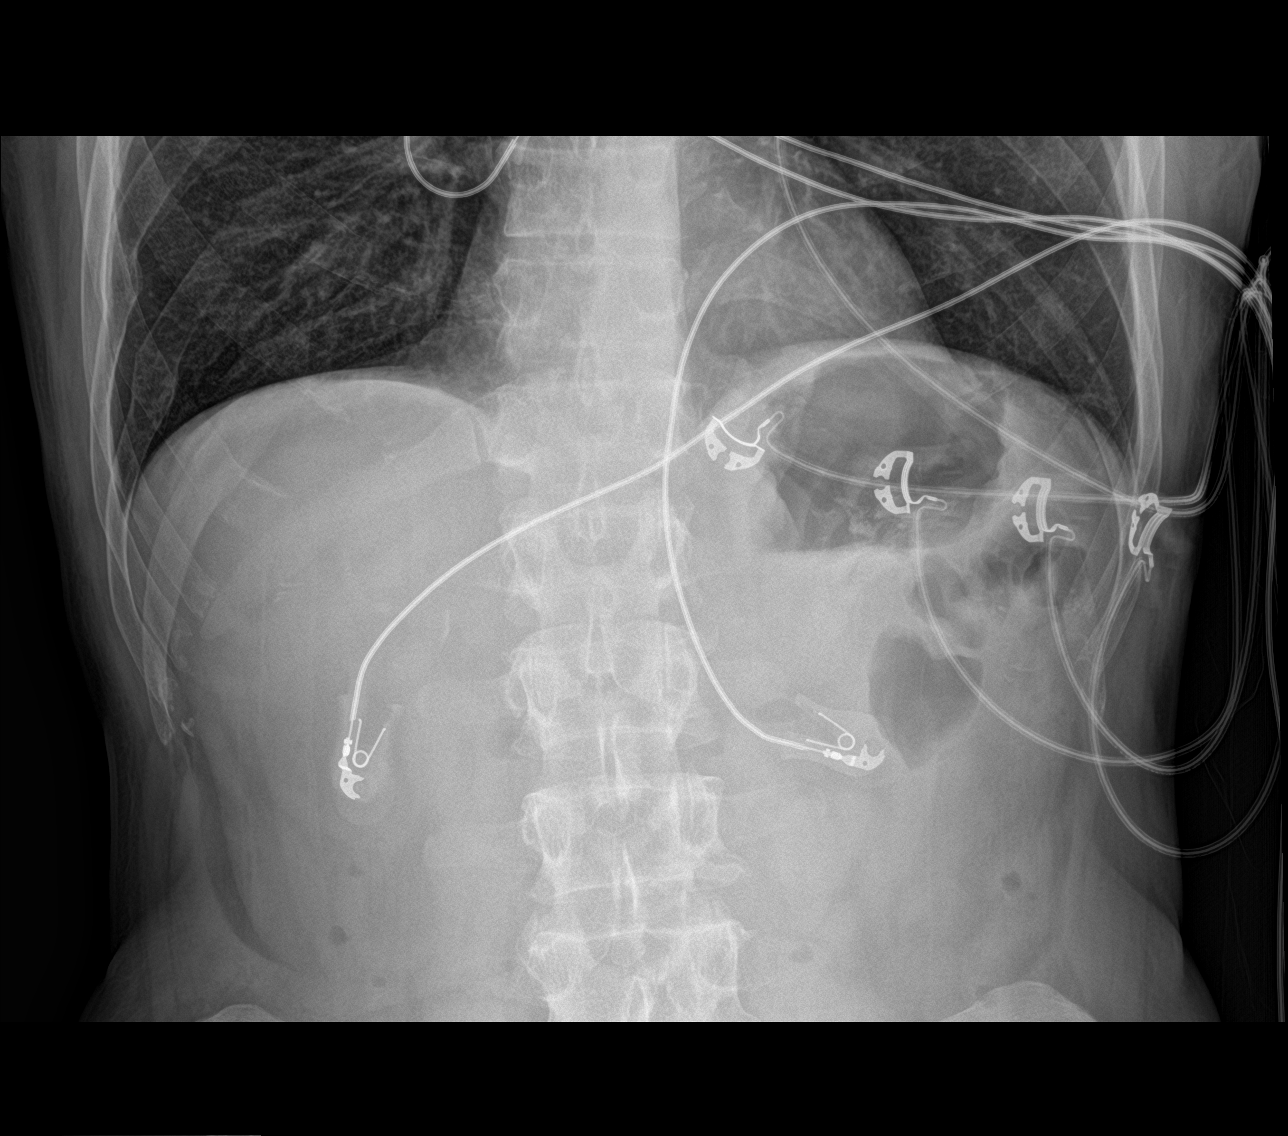

[abdomen supine]
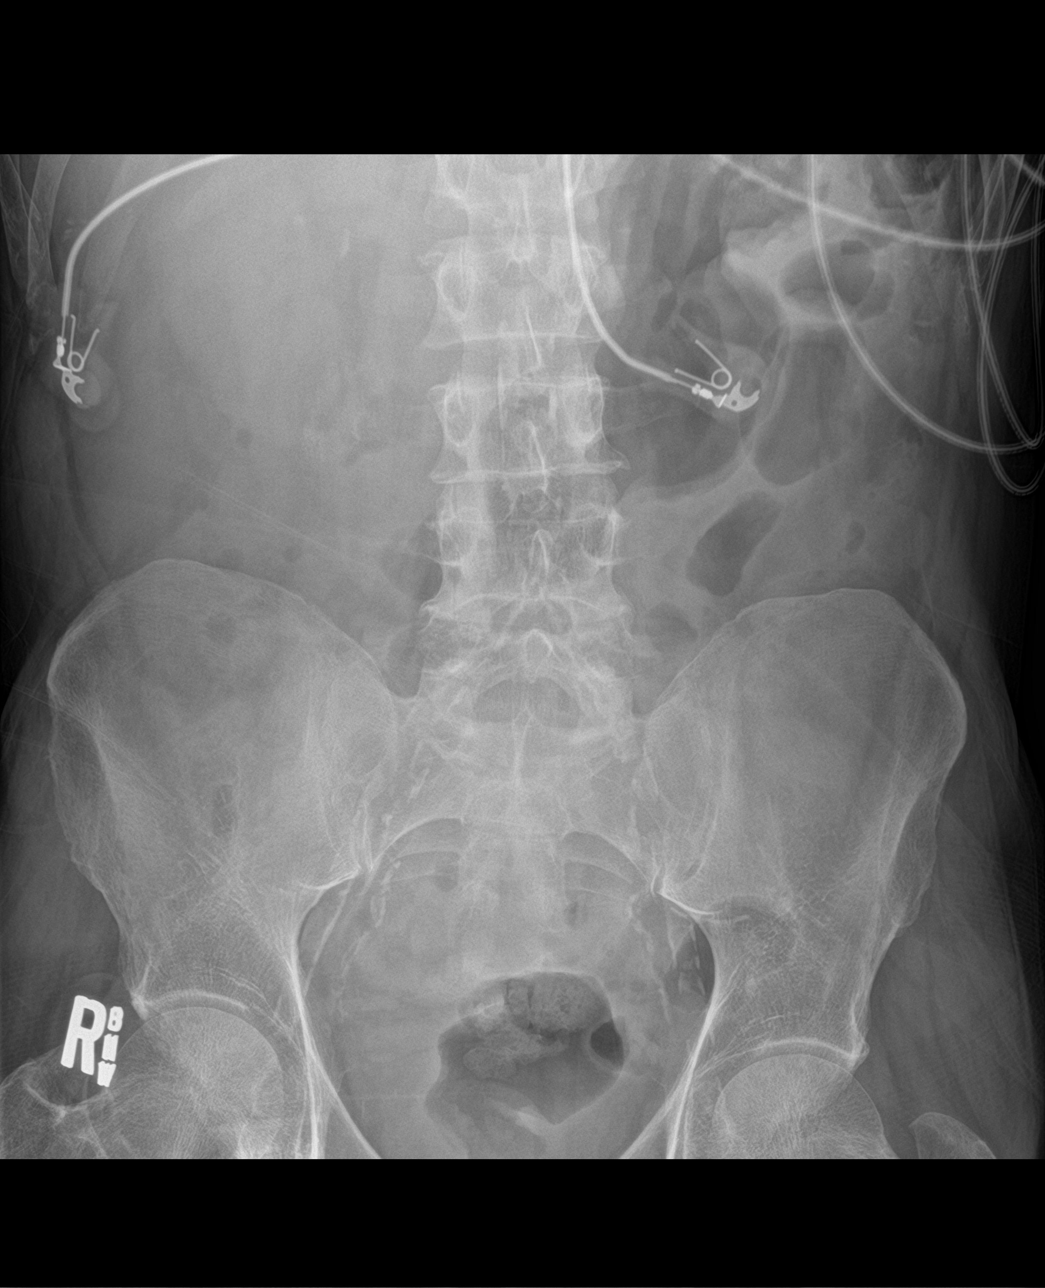

[3 of 3 positions shown; findings below may reference images not displayed]

FINDINGS: There is no evidence of dilated bowel loops or free intraperitoneal
air. No radiopaque calculi or other significant radiographic
abnormality is seen. Heart size and mediastinal contours are within
normal limits. Calcific aortic knob. Both lungs are clear. Remote
healed rib fractures of right ribs 9 and 10.
IMPRESSION: Negative abdominal radiographs.  No acute cardiopulmonary disease.

## 2019-04-28 MED ORDER — POTASSIUM CHLORIDE CRYS ER 20 MEQ PO TBCR
40.0000 meq | EXTENDED_RELEASE_TABLET | Freq: Once | ORAL | Status: AC
  Administered 2019-04-28: 40 meq via ORAL
  Filled 2019-04-28: qty 2

## 2019-04-28 MED ORDER — PANTOPRAZOLE SODIUM 40 MG IV SOLR
40.0000 mg | Freq: Once | INTRAVENOUS | Status: AC
  Administered 2019-04-28: 40 mg via INTRAVENOUS
  Filled 2019-04-28: qty 40

## 2019-04-28 MED ORDER — CHLORDIAZEPOXIDE HCL 25 MG PO CAPS: ORAL_CAPSULE | ORAL | 0 refills | Status: DC

## 2019-04-28 MED ORDER — SUCRALFATE 1 GM/10ML PO SUSP: 1.0000 g | Freq: Three times a day (TID) | ORAL | Status: DC

## 2019-04-28 MED ORDER — SUCRALFATE 1 GM/10ML PO SUSP
1.0000 g | Freq: Once | ORAL | Status: AC
  Administered 2019-04-28: 1 g via ORAL
  Filled 2019-04-28: qty 10

## 2019-04-28 MED ORDER — SODIUM CHLORIDE 0.9 % IV BOLUS
1000.0000 mL | Freq: Once | INTRAVENOUS | Status: AC
  Administered 2019-04-28: 16:00:00 1000 mL via INTRAVENOUS

## 2019-04-28 MED ORDER — PANTOPRAZOLE SODIUM 20 MG PO TBEC: 20.0000 mg | DELAYED_RELEASE_TABLET | Freq: Every day | ORAL | 0 refills | Status: DC

## 2019-04-28 MED ORDER — SUCRALFATE 1 G PO TABS: 1.0000 g | ORAL_TABLET | Freq: Three times a day (TID) | ORAL | 0 refills | Status: DC

## 2019-04-28 MED ORDER — ALUM & MAG HYDROXIDE-SIMETH 200-200-20 MG/5ML PO SUSP
30.0000 mL | Freq: Once | ORAL | Status: AC
  Administered 2019-04-28: 30 mL via ORAL
  Filled 2019-04-28: qty 30

## 2019-04-28 MED ORDER — ONDANSETRON HCL 4 MG/2ML IJ SOLN
4.0000 mg | Freq: Once | INTRAMUSCULAR | Status: AC
  Administered 2019-04-28: 17:00:00 4 mg via INTRAVENOUS
  Filled 2019-04-28: qty 2

## 2019-04-28 MED ORDER — FAMOTIDINE IN NACL 20-0.9 MG/50ML-% IV SOLN
20.0000 mg | Freq: Once | INTRAVENOUS | Status: AC
  Administered 2019-04-28: 20 mg via INTRAVENOUS
  Filled 2019-04-28: qty 50

## 2019-04-28 NOTE — ED Notes (Signed)
Pt states pain medication he was given is not working.

## 2019-04-28 NOTE — ED Notes (Signed)
PA in to reassess 

## 2019-04-28 NOTE — Discharge Instructions (Addendum)
Prescription given for Librium to help with the symptoms of alcohol withdrawal. Take medication as directed and do not operate machinery, drive a car, or work while taking this medication as it can make you drowsy.  You should not drink alcohol while taking this medication.  You were also given medications to help with your abdominal pain nausea and vomiting.  Please take as directed.  Please follow up with your primary care provider within 5-7 days for re-evaluation of your symptoms. Please return to the emergency department for any new or worsening symptoms.

## 2019-04-28 NOTE — ED Notes (Signed)
Pt with hx of alcohol abuse   Last etoh yesterday presents with 3 week hx of heartburn

## 2019-04-28 NOTE — ED Provider Notes (Signed)
[facility] EMERGENCY DEPARTMENT Provider Note   CSN: [id number] Arrival date & time: 04/28/19  1342     History   Chief Complaint Chief Complaint  Patient presents with   Abdominal Pain    HPI Bryan Fleming is a 58 y.o. male.     HPI   Pt is a 58 y/o male with a h/o COPD, GERD, hepatitis, HTN, who presents to the ED today epigastric abd pain that started about 3 weeks ago. States pain is in intermittent. Seems to be worse with eating. He is taking pepto bismal, mylanta, and another GERD medication for which he cannot remember the name. States these medications are not resolving his sxs. He denies hematemesis. Reports intermittent diarrhea. Denies hematochezia, melena, or constipation.   He reports associated nausea, vomiting, and burning in his chest consistent with his h/o acid reflux. Denies any new sob.  He states he drinks about 3-5 beers daily or sometimes a half a pint of liquor. States he has been drinking more since the [place] pandemic started. States that at this time he does not have an intention of stopping drinking ETOH.   Past Medical History:  Diagnosis Date   COPD (chronic obstructive pulmonary disease) ([place]) 2007   EMPHYSEMA   GERD (gastroesophageal reflux disease) 2010   Hepatitis    Alcoholic    Hypertension     Patient Active Problem List   Diagnosis Date Noted   Elevated liver function tests 02/02/2016   Absolute anemia 02/02/2016   Alcohol abuse 08/30/2015   Anxiety state 08/30/2015   Nicotine dependence, cigarettes, with other nicotine-induced disorders 08/30/2015   Hepatitis 08/31/2011   Hypertension    GERD (gastroesophageal reflux disease)    COPD (chronic obstructive pulmonary disease) ([place])     Past Surgical History:  Procedure Laterality Date   CARDIAC CATHETERIZATION     4-5 YEARS AGO   COLONOSCOPY  03/14/2012   Procedure: COLONOSCOPY;  Surgeon: Rogene Houston, MD;  Location: [facility];  Service: Endoscopy;   Laterality: N/A;  [place] Medications    Prior to Admission medications   Medication Sig Start Date End Date Taking? Authorizing Provider  aspirin 81 MG tablet Take 81 mg by mouth daily.   Yes [provider]  citalopram (CELEXA) 20 MG tablet Take 20 mg by mouth daily.   Yes [provider]  famotidine (PEPCID) 20 MG tablet Take 1 tablet (20 mg total) by mouth 2 (two) times daily. 03/29/19  Yes Francine Graven, DO  metoprolol tartrate (LOPRESSOR) 100 MG tablet TAKE 1 Tablet  BY MOUTH TWICE DAILY Patient taking differently: Take 100 mg by mouth 2 (two) times daily.  11/13/17  Yes Soyla Dryer, PA-C  mometasone-formoterol (DULERA) 200-5 MCG/ACT AERO Inhale 2 puffs into the lungs 2 (two) times daily. 08/16/17  Yes Soyla Dryer, PA-C  oxymetazoline (AFRIN) 0.05 % nasal spray Place 2 sprays into both nostrils daily as needed for congestion.   Yes [provider]  PROVENTIL HFA 108 (90 Base) MCG/ACT inhaler INHALE 2 PUFFS BY MOUTH EVERY 6 HOURS AS NEEDED FOR COUGHING, WHEEZING, OR SHORTNESS OF BREATH Patient taking differently: Inhale 2 puffs into the lungs every 6 (six) hours as needed for wheezing or shortness of breath.  11/13/17  Yes Soyla Dryer, PA-C  ranitidine (ZANTAC) 150 MG tablet Take 300 mg by mouth at bedtime.    Yes [provider]  tetrahydrozoline  0.05 % ophthalmic solution Place 1 drop into both eyes daily as needed (for dry eye).    Yes [provider]  traZODone (DESYREL) 100 MG tablet Take 200 mg by mouth at bedtime as needed for sleep.    Yes [provider]  vitamin B-12 (CYANOCOBALAMIN) 500 MCG tablet Take 500 mcg by mouth daily.   Yes [provider]  chlordiazePOXIDE (LIBRIUM) 25 MG capsule 50mg  PO TID x 1D, then 25-50mg  PO BID X 1D, then 25-50mg  PO QD X 1D 04/28/19   Nera Haworth S, PA-C  diphenhydrAMINE (BENADRYL) 25 MG tablet Take 25 mg by mouth daily as  needed for itching or allergies.     [provider]  doxycycline (VIBRA-TABS) 100 MG tablet Take 1 tablet (100 mg total) by mouth 2 (two) times daily. 03/29/19   Francine Graven, DO  loperamide (IMODIUM A-D) 2 MG tablet Take 2 mg by mouth daily as needed for diarrhea or loose stools.    [provider]  ondansetron (ZOFRAN ODT) 4 MG disintegrating tablet 4mg  ODT q4 hours prn nausea/vomit 04/22/18   Milton Ferguson, MD  ondansetron (ZOFRAN) 4 MG tablet Take 1 tablet (4 mg total) by mouth every 8 (eight) hours as needed for nausea or vomiting. 04/21/18   Virgel Manifold, MD  pantoprazole (PROTONIX) 20 MG tablet Take 1 tablet (20 mg total) by mouth daily for 14 days. 04/28/19 05/12/19  Giani Winther S, PA-C  sildenafil (VIAGRA) 100 MG tablet Take 0.5-1 tablets (50-100 mg total) by mouth daily as needed for erectile dysfunction. 12/28/17   Soyla Dryer, PA-C  sucralfate (CARAFATE) 1 g tablet Take 1 tablet (1 g total) by mouth 3 (three) times daily with meals for 14 days. 04/28/19 05/12/19  Deandrea Vanpelt S, PA-C  traMADol (ULTRAM) 50 MG tablet Take 1 tablet (50 mg total) by mouth every 6 (six) hours as needed. 04/22/18   Milton Ferguson, MD    Family History Family History  Problem Relation Age of Onset   Hypertension Mother    Asthma Father    Diabetes Sister    Hypertension Sister    Hypertension Brother    Hypertension Sister    Lupus Daughter     Social History Social History   Tobacco Use   Smoking status: Current Every Day Smoker    Packs/day: 1.00    Years: 37.00    Pack years: 37.00    Types: Cigarettes   Smokeless tobacco: Never Used  Substance Use Topics   Alcohol use: Yes    Alcohol/week: 22.0 standard drinks    Types: 21 Cans of beer, 1 Standard drinks or equivalent per week    Comment: daily   Drug use: No     Allergies   Patient has no known allergies.   Review of Systems Review of Systems  Constitutional: Negative for fever.    HENT: Negative for ear pain and sore throat.   Eyes: Negative for visual disturbance.  Respiratory: Negative for cough and shortness of breath.   Cardiovascular: Positive for chest pain (consistent with gerd).  Gastrointestinal: Positive for abdominal pain, diarrhea, nausea and vomiting. Negative for blood in stool and constipation.  Genitourinary: Negative for dysuria and hematuria.  Musculoskeletal: Negative for back pain.  Skin: Negative for color change and rash.  Neurological: Negative for seizures and headaches.  All other systems reviewed and are negative.    Physical Exam Updated Vital Signs BP (!) 157/75    Pulse 82    Temp 98.8  F (37.1 C) (Oral)    Resp 14    Ht 5\' 9"  (1.753 m)    Wt 65.8 kg    SpO2 97%    BMI 21.41 kg/m   Physical Exam Vitals signs and nursing note reviewed.  Constitutional:      General: He is not in acute distress.    Appearance: He is well-developed. He is not ill-appearing.  HENT:     Head: Normocephalic and atraumatic.     Mouth/Throat:     Mouth: Mucous membranes are dry.  Eyes:     Conjunctiva/sclera: Conjunctivae normal.  Neck:     Musculoskeletal: Neck supple.  Cardiovascular:     Rate and Rhythm: Normal rate and regular rhythm.     Heart sounds: Normal heart sounds. No murmur.  Pulmonary:     Effort: Pulmonary effort is normal. No respiratory distress.     Breath sounds: Normal breath sounds. No wheezing, rhonchi or rales.  Abdominal:     General: Bowel sounds are normal.     Palpations: Abdomen is soft.     Tenderness: There is no abdominal tenderness.  Skin:    General: Skin is warm and dry.  Neurological:     Mental Status: He is alert.    ED Treatments / Results  Labs (all labs ordered are listed, but only abnormal results are displayed) Labs Reviewed  LIPASE, BLOOD - Abnormal; Notable for the following components:      Result Value   Lipase 72 (*)    All other components within normal limits  COMPREHENSIVE METABOLIC  PANEL - Abnormal; Notable for the following components:   Potassium 2.8 (*)    Chloride 93 (*)    Glucose, Bld 117 (*)    Total Protein 8.4 (*)    AST 44 (*)    Total Bilirubin 1.5 (*)    Anion gap 16 (*)    All other components within normal limits  URINALYSIS, ROUTINE W REFLEX MICROSCOPIC - Abnormal; Notable for the following components:   Color, Urine AMBER (*)    APPearance HAZY (*)    Bilirubin Urine SMALL (*)    Ketones, ur 20 (*)    Protein, ur >=300 (*)    Bacteria, UA RARE (*)    All other components within normal limits  CBC  MAGNESIUM  ETHANOL  TROPONIN I (HIGH SENSITIVITY)    EKG EKG Interpretation  Date/Time:  Sunday April 28 2019 16:46:13 EDT Ventricular Rate:  71 PR Interval:    QRS Duration: 82 QT Interval:  468 QTC Calculation: 509 R Axis:   82 Text Interpretation:  Sinus rhythm Abnormal T, consider ischemia, anterior leads Prolonged QT interval When compared with ECG of 03/29/2019 QT has lengthened Nonspecific T wave abnormality is now Present Confirmed by Francine Graven [id number]) on 04/28/2019 4:51:33 PM   Radiology Dg Abd Acute W/chest  Result Date: 04/28/2019 CLINICAL DATA:  Abdominal pain, acid reflux EXAM: DG ABDOMEN ACUTE W/ 1V CHEST COMPARISON:  06/16/2016, 03/29/2019 FINDINGS: There is no evidence of dilated bowel loops or free intraperitoneal air. No radiopaque calculi or other significant radiographic abnormality is seen. Heart size and mediastinal contours are within normal limits. Calcific aortic knob. Both lungs are clear. Remote healed rib fractures of right ribs 9 and 10. IMPRESSION: Negative abdominal radiographs.  No acute cardiopulmonary disease. Electronically Signed   By: Davina Poke M.D.   On: 04/28/2019 18:10    Procedures Procedures (including critical care time)  Medications Ordered in ED  Medications  sodium chloride 0.9 % bolus 1,000 mL (0 mLs Intravenous Stopped 04/28/19 1803)  famotidine (PEPCID) IVPB 20 mg premix (0  mg Intravenous Stopped 04/28/19 1719)  pantoprazole (PROTONIX) injection 40 mg (40 mg Intravenous Given 04/28/19 1633)  alum & mag hydroxide-simeth (MAALOX/MYLANTA) 200-200-20 MG/5ML suspension 30 mL (30 mLs Oral Given 04/28/19 1631)  ondansetron (ZOFRAN) injection 4 mg (4 mg Intravenous Given 04/28/19 1633)  sucralfate (CARAFATE) 1 GM/10ML suspension 1 g (1 g Oral Given 04/28/19 1630)  potassium chloride SA (K-DUR) CR tablet 40 mEq (40 mEq Oral Given 04/28/19 1632)     Initial Impression / Assessment and Plan / ED Course  I have reviewed the triage vital signs and the nursing notes.  Pertinent labs & imaging results that were available during my care of the patient were reviewed by me and considered in my medical decision making (see chart for details).   Final Clinical Impressions(s) / ED Diagnoses   Final diagnoses:  Abdominal pain, unspecified abdominal location  Non-intractable vomiting with nausea, unspecified vomiting type  Hypokalemia  Alcohol use   Patient is a 58 year old male with a history of GERD, esophagitis, chronic alcohol use presenting for evaluation of epigastric abdominal pain nausea and vomiting intermittently for the last 3 weeks.  States his GERD has been exacerbated and unrelieved by home medications.  Patient initially marginally tachycardic and marginally hypertensive.  Afebrile with normal respiratory status.  Exam is reassuring.  Abdomen is nonsurgical.  Reviewed labs ordered in triage CBC demonstrates no evidence of leukocytosis, no anemia CMP with hypokalemia with potassium of 2.8, mild hypochloremia.  Normal renal function.  AST slightly elevated, improved from prior.  Total bilirubin elevated at 1.5 which appears to be the patient's baseline.  Anion gap is slightly elevated at 16.  Normal bicarb. Lipase slightly elevated at 72, may be indicative of pancreatitis.  Especially in the setting of chronic EtOH use. UA with with bilirubinuria, ketonuria,  proteinuria. No leukocytes or nitrites.   EKG with NSR, Abnormal T, consider ischemia, anterior leads Prolonged QT interval When compared with ECG of 03/29/2019 QT has lengthened Nonspecific T wave abnormality is now   Reviewed prior records.  Patient seen for similar symptoms last month and had CT scan which demonstrated esophagitis.  Patient given IV fluids, antacids, GI cocktail and antiemetics.  He was also given potassium supplementation.  On reassessment he feels somewhat improved.  He is able to tolerate p.o.  I had a long discussion with the patient regarding alcohol cessation.  He now states that he has hopes to stop drinking.  I will give him a Librium taper.  Discussed risks of taking medication and that he should not take this medication with alcohol and he voices understanding.  I will also consult peer support and give him resources for outpatient follow-up.  With regards to his initial complaints of abdominal pain nausea and vomiting, I suspect that his symptoms are related to esophagitis versus alcoholic gastritis.  Will give Rx for Protonix and Carafate for home.  We will also give potassium supplementation.  I advised him to return to the ED for new or worsening symptoms.  ED Discharge Orders         Ordered    Consult to Peer Support    Provider:  (Not yet assigned)   04/28/19 1833    chlordiazePOXIDE (LIBRIUM) 25 MG capsule     04/28/19 1835    pantoprazole (PROTONIX) 20 MG tablet  Daily  04/28/19 1837    sucralfate (CARAFATE) 1 g tablet  3 times daily with meals     04/28/19 1837           Numair Masden S, PA-C 04/28/19 [place], [place], DO 05/02/19 1603

## 2019-04-28 NOTE — ED Notes (Signed)
Call to lab  Re: added labs

## 2019-04-28 NOTE — ED Triage Notes (Signed)
Pt reports upper abdominal pain with burning in esophagus for the past few days. States he has been taking his acid reflux medications without relief. Nausea with no vomiting. Daily etoh.

## 2019-09-03 ENCOUNTER — Other Ambulatory Visit: Payer: Self-pay

## 2019-09-03 ENCOUNTER — Other Ambulatory Visit (HOSPITAL_COMMUNITY): Payer: Self-pay | Admitting: Internal Medicine

## 2019-09-03 ENCOUNTER — Ambulatory Visit (HOSPITAL_COMMUNITY)
Admission: RE | Admit: 2019-09-03 | Discharge: 2019-09-03 | Disposition: A | Discharge status: Home / Self Care | Payer: Medicare Other | Source: Ambulatory Visit | Attending: Internal Medicine | Admitting: Internal Medicine

## 2019-09-03 ENCOUNTER — Other Ambulatory Visit (HOSPITAL_COMMUNITY)
Admission: RE | Admit: 2019-09-03 | Discharge: 2019-09-03 | Disposition: A | Discharge status: Home / Self Care | Payer: Medicare Other | Source: Ambulatory Visit | Attending: Internal Medicine | Admitting: Internal Medicine

## 2019-09-03 DIAGNOSIS — I10 Essential (primary) hypertension: Secondary | ICD-10-CM | POA: Insufficient documentation

## 2019-09-03 DIAGNOSIS — J449 Chronic obstructive pulmonary disease, unspecified: Secondary | ICD-10-CM

## 2019-09-03 LAB — BASIC METABOLIC PANEL
Anion gap: 11 (ref 5–15)
BUN: 8 mg/dL (ref 6–20)
CO2: 27 mmol/L (ref 22–32)
Calcium: 9.5 mg/dL (ref 8.9–10.3)
Chloride: 96 mmol/L — ABNORMAL LOW (ref 98–111)
Creatinine, Ser: 0.58 mg/dL — ABNORMAL LOW (ref 0.61–1.24)
GFR calc Af Amer: >60 mL/min (ref >60)
GFR calc non Af Amer: >60 mL/min (ref >60)
Glucose, Bld: 83 mg/dL (ref 70–99)
Potassium: 4 mmol/L (ref 3.5–5.1)
Sodium: 134 mmol/L — ABNORMAL LOW (ref 135–145)

## 2019-09-03 LAB — CBC WITH DIFFERENTIAL/PLATELET
Abs Immature Granulocytes: 0.02 10*3/uL (ref 0.00–0.07)
Basophils Absolute: 0 10*3/uL (ref 0.0–0.1)
Basophils Relative: 1 %
Eosinophils Absolute: 0.1 10*3/uL (ref 0.0–0.5)
Eosinophils Relative: 1 %
HCT: 50.5 % (ref 39.0–52.0)
Hemoglobin: 16.9 g/dL (ref 13.0–17.0)
Immature Granulocytes: 0 %
Lymphocytes Relative: 27 %
Lymphs Abs: 2.2 10*3/uL (ref 0.7–4.0)
MCH: 31.7 pg (ref 26.0–34.0)
MCHC: 33.5 g/dL (ref 30.0–36.0)
MCV: 94.7 fL (ref 80.0–100.0)
Monocytes Absolute: 0.8 10*3/uL (ref 0.1–1.0)
Monocytes Relative: 10 %
Neutro Abs: 5 10*3/uL (ref 1.7–7.7)
Neutrophils Relative %: 61 %
Platelets: 181 10*3/uL (ref 150–400)
RBC: 5.33 MIL/uL (ref 4.22–5.81)
RDW: 15 % (ref 11.5–15.5)
WBC: 8.1 10*3/uL (ref 4.0–10.5)
nRBC: 0 % (ref 0.0–0.2)

## 2019-09-03 LAB — HEPATIC FUNCTION PANEL
ALT: 40 U/L (ref 0–44)
AST: 93 U/L — ABNORMAL HIGH (ref 15–41)
Albumin: 4.2 g/dL (ref 3.5–5.0)
Alkaline Phosphatase: 100 U/L (ref 38–126)
Bilirubin, Direct: 0.3 mg/dL — ABNORMAL HIGH (ref 0.0–0.2)
Indirect Bilirubin: 1.4 mg/dL — ABNORMAL HIGH (ref 0.3–0.9)
Total Bilirubin: 1.7 mg/dL — ABNORMAL HIGH (ref 0.3–1.2)
Total Protein: 8.1 g/dL (ref 6.5–8.1)

## 2019-09-03 LAB — LIPID PANEL
Cholesterol: 196 mg/dL (ref 0–200)
HDL: 63 mg/dL (ref >40)
LDL Cholesterol: 110 mg/dL — ABNORMAL HIGH (ref 0–99)
Total CHOL/HDL Ratio: 3.1 RATIO
Triglycerides: 114 mg/dL (ref <150)
VLDL: 23 mg/dL (ref 0–40)

## 2019-09-03 IMAGING — DX DG CHEST 2V
2 series · 2 of 2 positions shown · non-contrast
Comparison: [DATE]

CLINICAL DATA: COPD, increased shortness of breath

EXAM:
CHEST - 2 VIEW

[chest pa]
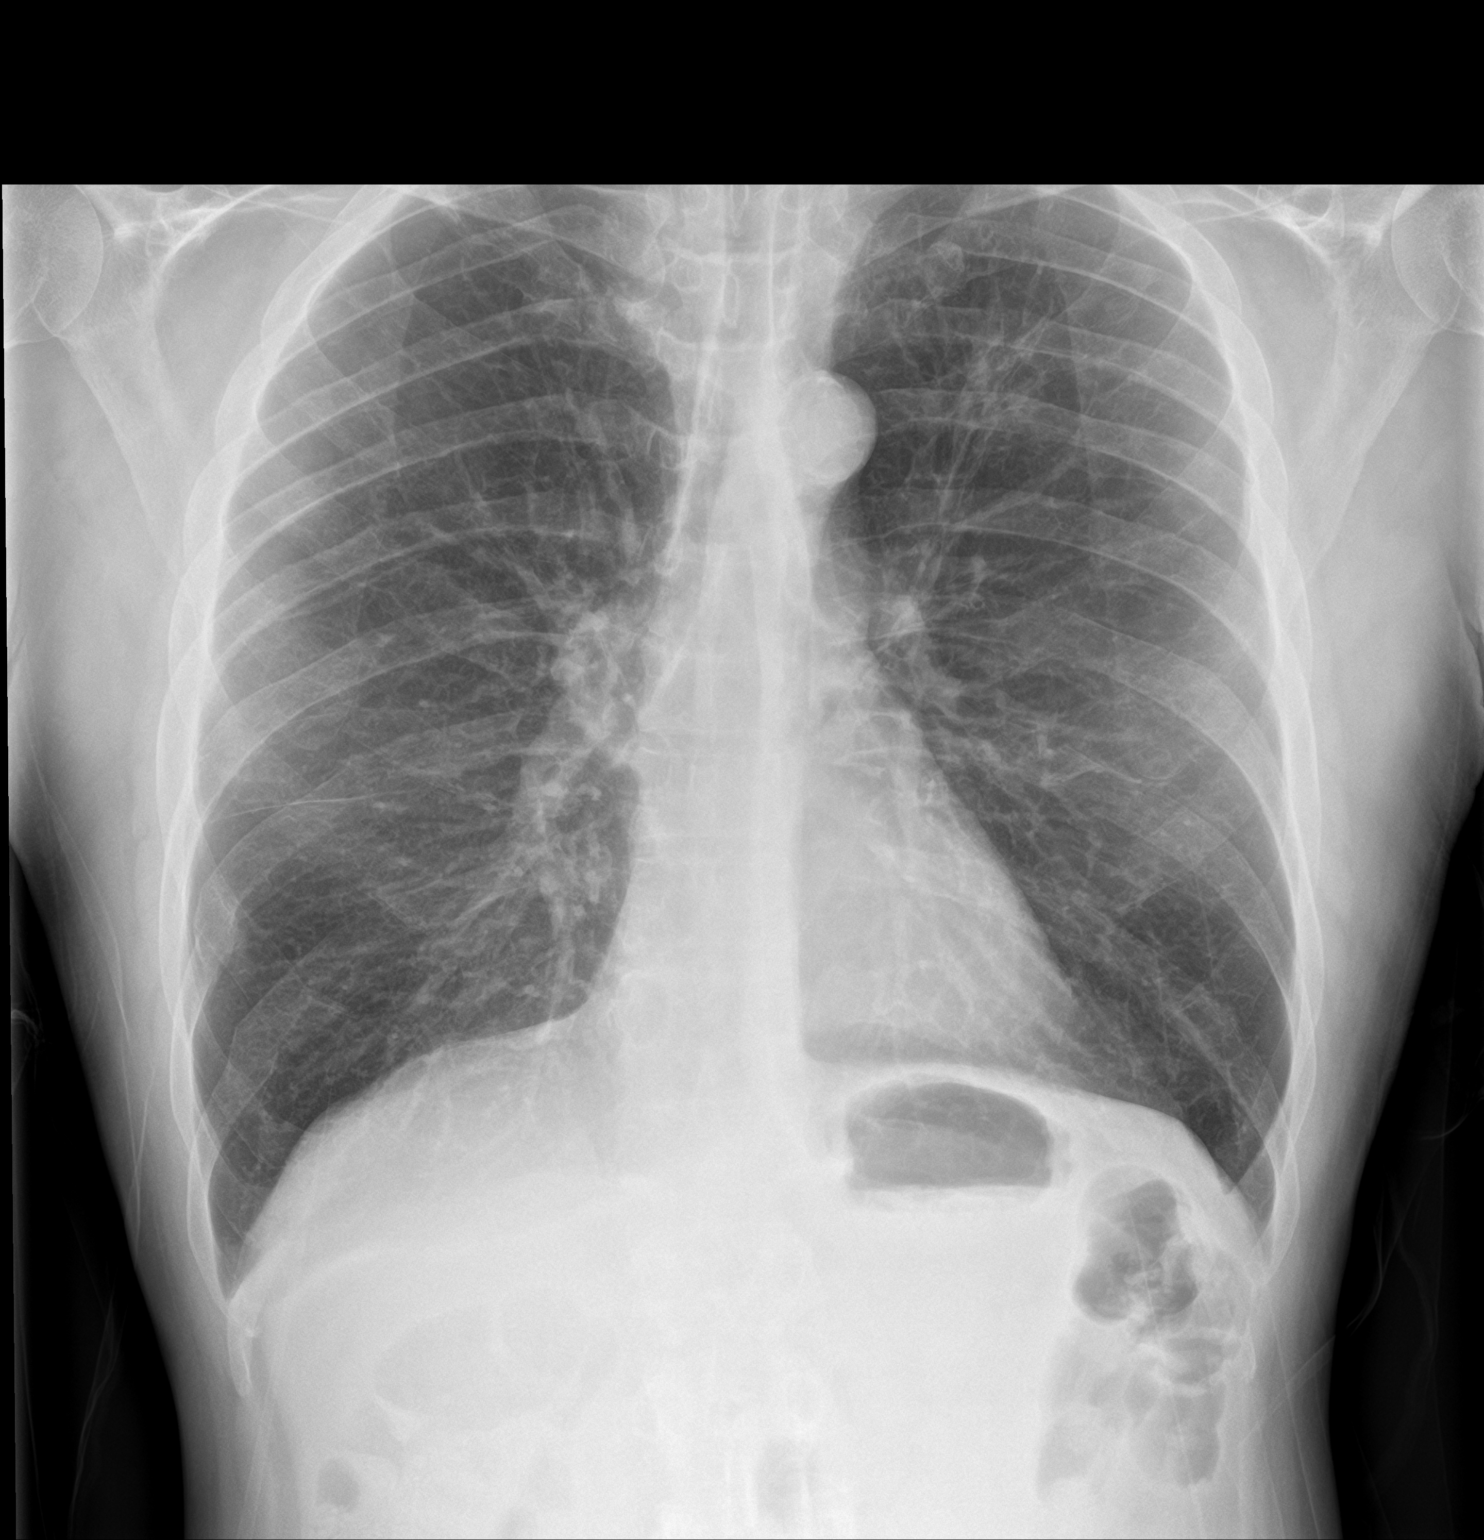

[chest lat]
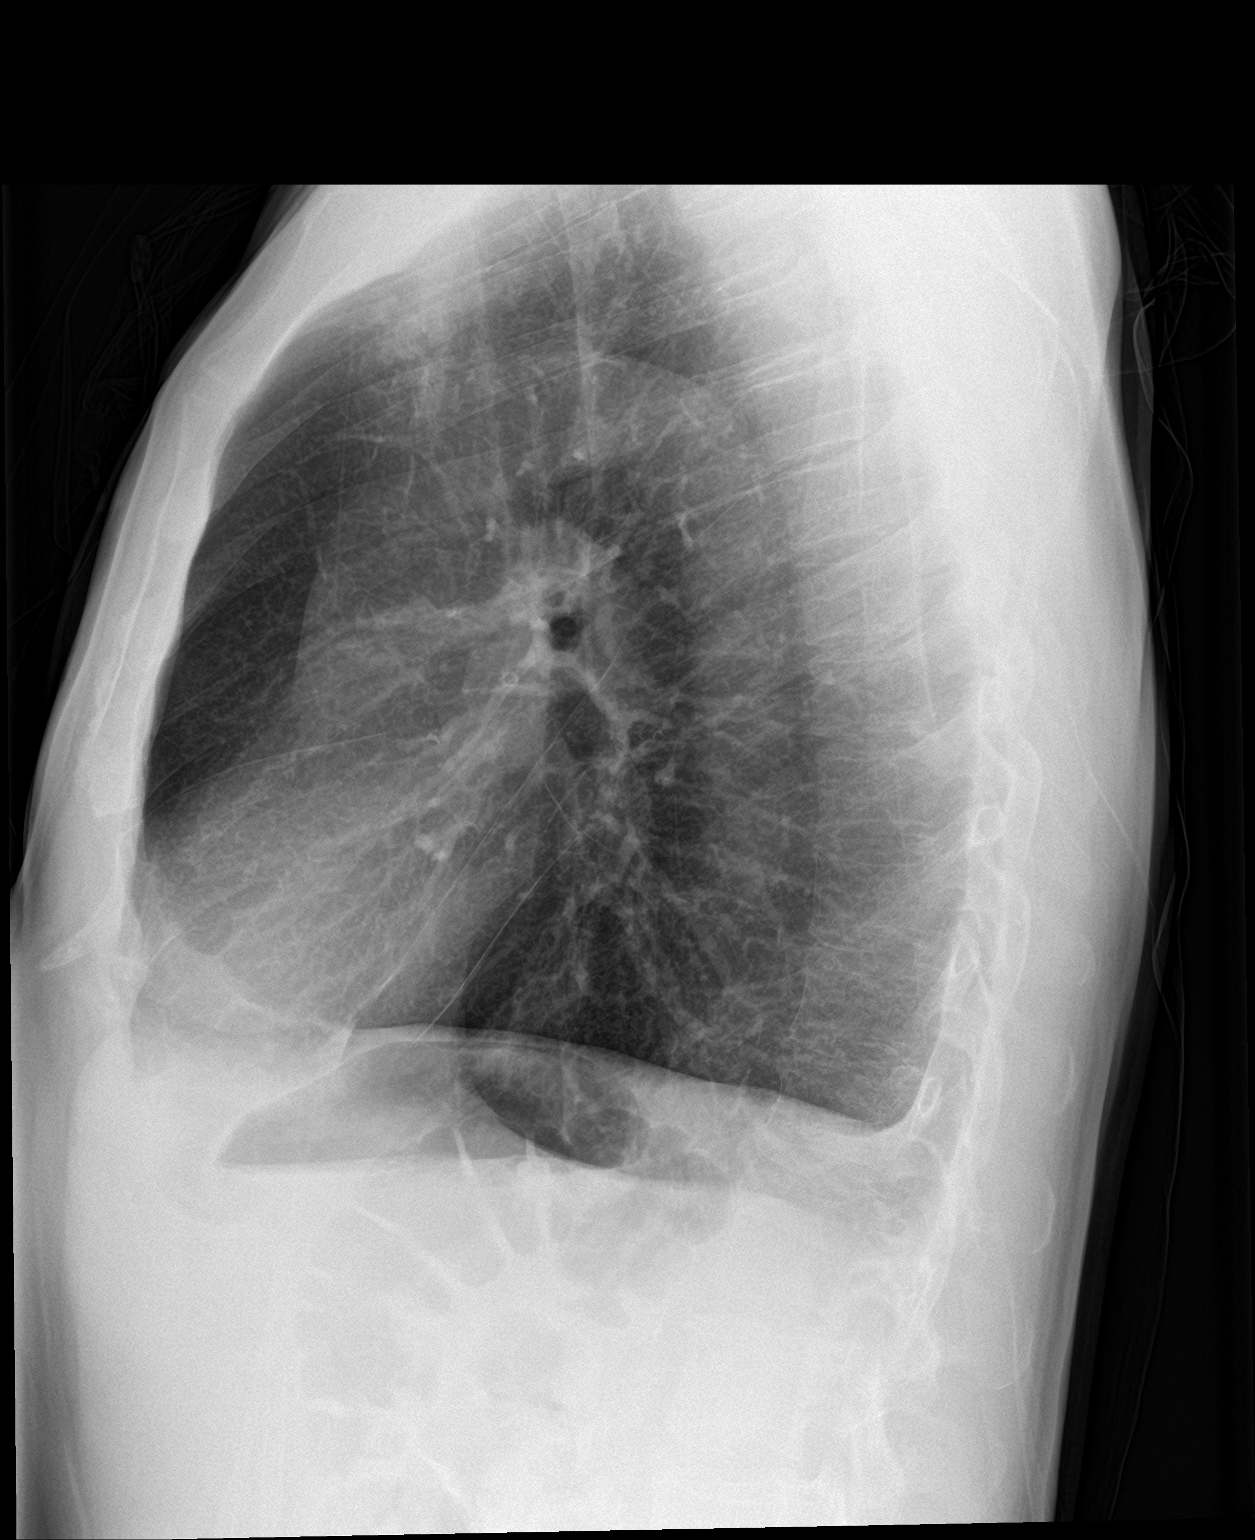

[2 of 2 positions shown; findings below may reference images not displayed]

FINDINGS: Hyperinflated lungs with flattening of the diaphragm reflecting
COPD. No new consolidation or edema. Heart size is normal. No acute
osseous abnormality.
IMPRESSION: COPD.  No acute process in the chest.

## 2019-09-11 ENCOUNTER — Encounter (INDEPENDENT_AMBULATORY_CARE_PROVIDER_SITE_OTHER): Payer: Self-pay | Admitting: *Deleted

## 2019-12-06 ENCOUNTER — Emergency Department (HOSPITAL_COMMUNITY)
Admission: EM | Admit: 2019-12-06 | Discharge: 2019-12-06 | Disposition: A | Discharge status: Home / Self Care | Payer: Medicare Other | Source: Home / Self Care | Attending: Emergency Medicine | Admitting: Emergency Medicine

## 2019-12-06 ENCOUNTER — Other Ambulatory Visit: Payer: Self-pay

## 2019-12-06 ENCOUNTER — Encounter (HOSPITAL_COMMUNITY): Payer: Self-pay | Admitting: Emergency Medicine

## 2019-12-06 ENCOUNTER — Encounter (HOSPITAL_COMMUNITY): Payer: Self-pay | Admitting: *Deleted

## 2019-12-06 ENCOUNTER — Emergency Department (HOSPITAL_COMMUNITY)
Admission: EM | Admit: 2019-12-06 | Discharge: 2019-12-06 | Disposition: A | Discharge status: Home / Self Care | Payer: Medicare Other | Attending: Emergency Medicine | Admitting: Emergency Medicine

## 2019-12-06 DIAGNOSIS — R197 Diarrhea, unspecified: Secondary | ICD-10-CM | POA: Insufficient documentation

## 2019-12-06 DIAGNOSIS — R112 Nausea with vomiting, unspecified: Secondary | ICD-10-CM | POA: Insufficient documentation

## 2019-12-06 DIAGNOSIS — Z9114 Patient's other noncompliance with medication regimen: Secondary | ICD-10-CM | POA: Diagnosis not present

## 2019-12-06 DIAGNOSIS — F17210 Nicotine dependence, cigarettes, uncomplicated: Secondary | ICD-10-CM | POA: Insufficient documentation

## 2019-12-06 DIAGNOSIS — Z79899 Other long term (current) drug therapy: Secondary | ICD-10-CM | POA: Insufficient documentation

## 2019-12-06 DIAGNOSIS — J449 Chronic obstructive pulmonary disease, unspecified: Secondary | ICD-10-CM | POA: Insufficient documentation

## 2019-12-06 DIAGNOSIS — R1013 Epigastric pain: Secondary | ICD-10-CM | POA: Diagnosis present

## 2019-12-06 DIAGNOSIS — K219 Gastro-esophageal reflux disease without esophagitis: Secondary | ICD-10-CM | POA: Insufficient documentation

## 2019-12-06 DIAGNOSIS — Z7982 Long term (current) use of aspirin: Secondary | ICD-10-CM | POA: Insufficient documentation

## 2019-12-06 DIAGNOSIS — I10 Essential (primary) hypertension: Secondary | ICD-10-CM | POA: Insufficient documentation

## 2019-12-06 DIAGNOSIS — F1020 Alcohol dependence, uncomplicated: Secondary | ICD-10-CM | POA: Insufficient documentation

## 2019-12-06 LAB — CBC
HCT: 53.2 % — ABNORMAL HIGH (ref 39.0–52.0)
HCT: 52.9 % — ABNORMAL HIGH (ref 39.0–52.0)
Hemoglobin: 17.3 g/dL — ABNORMAL HIGH (ref 13.0–17.0)
Hemoglobin: 17.2 g/dL — ABNORMAL HIGH (ref 13.0–17.0)
MCH: 30.9 pg (ref 26.0–34.0)
MCH: 30.6 pg (ref 26.0–34.0)
MCHC: 32.5 g/dL (ref 30.0–36.0)
MCHC: 32.5 g/dL (ref 30.0–36.0)
MCV: 95 fL (ref 80.0–100.0)
MCV: 94.1 fL (ref 80.0–100.0)
Platelets: 315 10*3/uL (ref 150–400)
Platelets: 344 10*3/uL (ref 150–400)
RBC: 5.6 MIL/uL (ref 4.22–5.81)
RBC: 5.62 MIL/uL (ref 4.22–5.81)
RDW: 13.6 % (ref 11.5–15.5)
RDW: 13.8 % (ref 11.5–15.5)
WBC: 10.2 10*3/uL (ref 4.0–10.5)
WBC: 11.4 10*3/uL — ABNORMAL HIGH (ref 4.0–10.5)
nRBC: 0 % (ref 0.0–0.2)
nRBC: 0 % (ref 0.0–0.2)

## 2019-12-06 LAB — URINALYSIS, ROUTINE W REFLEX MICROSCOPIC
Bacteria, UA: NONE SEEN
Bilirubin Urine: NEGATIVE
Glucose, UA: NEGATIVE mg/dL
Hgb urine dipstick: NEGATIVE
Ketones, ur: 20 mg/dL — AB
Nitrite: NEGATIVE
Protein, ur: 100 mg/dL — AB
Specific Gravity, Urine: 1.028 (ref 1.005–1.030)
pH: 5 (ref 5.0–8.0)

## 2019-12-06 LAB — COMPREHENSIVE METABOLIC PANEL
ALT: 14 U/L (ref 0–44)
ALT: 15 U/L (ref 0–44)
AST: 18 U/L (ref 15–41)
AST: 21 U/L (ref 15–41)
Albumin: 4.6 g/dL (ref 3.5–5.0)
Albumin: 4.4 g/dL (ref 3.5–5.0)
Alkaline Phosphatase: 82 U/L (ref 38–126)
Alkaline Phosphatase: 78 U/L (ref 38–126)
Anion gap: 13 (ref 5–15)
Anion gap: 14 (ref 5–15)
BUN: 16 mg/dL (ref 6–20)
BUN: 15 mg/dL (ref 6–20)
CO2: 26 mmol/L (ref 22–32)
CO2: 25 mmol/L (ref 22–32)
Calcium: 9.8 mg/dL (ref 8.9–10.3)
Calcium: 10 mg/dL (ref 8.9–10.3)
Chloride: 96 mmol/L — ABNORMAL LOW (ref 98–111)
Chloride: 97 mmol/L — ABNORMAL LOW (ref 98–111)
Creatinine, Ser: 0.71 mg/dL (ref 0.61–1.24)
Creatinine, Ser: 0.88 mg/dL (ref 0.61–1.24)
GFR calc Af Amer: >60 mL/min (ref >60)
GFR calc Af Amer: >60 mL/min (ref >60)
GFR calc non Af Amer: >60 mL/min (ref >60)
GFR calc non Af Amer: >60 mL/min (ref >60)
Glucose, Bld: 99 mg/dL (ref 70–99)
Glucose, Bld: 102 mg/dL — ABNORMAL HIGH (ref 70–99)
Potassium: 3.8 mmol/L (ref 3.5–5.1)
Potassium: 3.9 mmol/L (ref 3.5–5.1)
Sodium: 135 mmol/L (ref 135–145)
Sodium: 136 mmol/L (ref 135–145)
Total Bilirubin: 1.7 mg/dL — ABNORMAL HIGH (ref 0.3–1.2)
Total Bilirubin: 2.1 mg/dL — ABNORMAL HIGH (ref 0.3–1.2)
Total Protein: 8.8 g/dL — ABNORMAL HIGH (ref 6.5–8.1)
Total Protein: 8.3 g/dL — ABNORMAL HIGH (ref 6.5–8.1)

## 2019-12-06 LAB — LIPASE, BLOOD
Lipase: 70 U/L — ABNORMAL HIGH (ref 11–51)
Lipase: 65 U/L — ABNORMAL HIGH (ref 11–51)

## 2019-12-06 MED ORDER — SODIUM CHLORIDE 0.9% FLUSH: 3.0000 mL | Freq: Once | INTRAVENOUS | Status: DC

## 2019-12-06 MED ORDER — ONDANSETRON HCL 4 MG PO TABS
4.0000 mg | ORAL_TABLET | Freq: Four times a day (QID) | ORAL | 0 refills | Status: DC
Start: 2019-12-06 — End: 2020-06-04

## 2019-12-06 MED ORDER — SODIUM CHLORIDE 0.9 % IV BOLUS
1000.0000 mL | Freq: Once | INTRAVENOUS | Status: AC
  Administered 2019-12-06: 1000 mL via INTRAVENOUS

## 2019-12-06 MED ORDER — FAMOTIDINE IN NACL 20-0.9 MG/50ML-% IV SOLN
20.0000 mg | Freq: Once | INTRAVENOUS | Status: AC
  Administered 2019-12-06: 20 mg via INTRAVENOUS
  Filled 2019-12-06: qty 50

## 2019-12-06 MED ORDER — LIDOCAINE VISCOUS HCL 2 % MT SOLN
15.0000 mL | Freq: Once | OROMUCOSAL | Status: AC
  Administered 2019-12-06: 22:00:00 15 mL via ORAL
  Filled 2019-12-06: qty 15

## 2019-12-06 MED ORDER — ONDANSETRON HCL 4 MG/2ML IJ SOLN
4.0000 mg | Freq: Once | INTRAMUSCULAR | Status: AC
  Administered 2019-12-06: 4 mg via INTRAVENOUS
  Filled 2019-12-06: qty 2

## 2019-12-06 MED ORDER — ALUM & MAG HYDROXIDE-SIMETH 200-200-20 MG/5ML PO SUSP
30.0000 mL | Freq: Once | ORAL | Status: AC
  Administered 2019-12-06: 22:00:00 30 mL via ORAL
  Filled 2019-12-06: qty 30

## 2019-12-06 NOTE — ED Provider Notes (Signed)
[facility] Provider Note   CSN: [id number] Arrival date & time: 12/06/19  1954     History Chief Complaint  Patient presents with  . Abdominal Pain  . Gastroesophageal Reflux    Bryan Fleming is a 59 y.o. male.  HPI 59 year old male with a history of GERD, COPD, hepatitis, alcohol abuse, cigarette use presents to the ED with a 2-day history of epigastric pain, nausea, vomiting and diarrhea.  Patient states that he ate some sausage with fried onions yesterday and began to have burning epigastric pain with a subsequent onset of nausea and vomiting and diarrhea this morning.  Patient states that this seems to be consistent with his GERD symptoms.  Emesis is nonbloody nonbilious, diarrhea without evidence of blood or melena.  He denies any chest pain, states he has shortness of breath at baseline secondary to COPD but has not noted any increase of shortness of breath.  He endorses drinking approximately 5-6 beers a day depending on the day.  He has not taking anything for symptoms, states that he ran out of his PPIs and has not  been able to afford them.  He has no headaches, dizziness, syncope, back pain, lower abdominal pain, swelling of the legs, dysuria, hematuria, constipation.  No marijuana use.    Past Medical History:  Diagnosis Date  . COPD (chronic obstructive pulmonary disease) ([place]) 2007   EMPHYSEMA  . GERD (gastroesophageal reflux disease) 2010  . Hepatitis    Alcoholic   . Hypertension     Patient Active Problem List   Diagnosis Date Noted  . Elevated liver function tests 02/02/2016  . 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 Alcohol abuse 08/30/2015  . Anxiety state 08/30/2015  . Nicotine dependence, cigarettes, with other nicotine-induced disorders 08/30/2015  . Hepatitis 08/31/2011  . Hypertension   . GERD (gastroesophageal reflux disease)   . COPD (chronic obstructive pulmonary disease) ([place])     Past Surgical History:    Procedure Laterality Date  . CARDIAC CATHETERIZATION     4-5 YEARS AGO  . COLONOSCOPY  03/14/2012   Procedure: COLONOSCOPY;  Surgeon: Rogene Houston, MD;  Location: [facility];  Service: Endoscopy;  Laterality: N/A;  100  . WRIST GANGLION EXCISION  1985       Family History  Problem Relation Age of Onset  . Hypertension Mother   . Asthma Father   . Diabetes Sister   . Hypertension Sister   . Hypertension Brother   . Hypertension Sister   . Lupus Daughter     Social History   Tobacco Use  . Smoking status: Current Every Day Smoker    Packs/day: 1.00    Years: 37.00    Pack years: 37.00    Types: Cigarettes  . Smokeless tobacco: Never Used  Substance Use Topics  . Alcohol use: Yes    Alcohol/week: 22.0 standard drinks    Types: 21 Cans of beer, 1 Standard drinks or equivalent per week    Comment: daily  . Drug use: No    Home Medications Prior to Admission medications   Medication Sig Start Date End Date Taking? Authorizing Provider  acetaminophen (TYLENOL) 500 MG tablet Take 500-1,000 mg by mouth every 8 (eight) hours as needed for mild pain or headache.   Yes [provider]  alum & mag hydroxide-simeth (MYLANTA) 200-200-20 MG/5ML suspension Take 30 mLs by mouth every 6 (six) hours as needed for indigestion or heartburn.   Yes [provider]  aspirin 81 MG tablet Take 81 mg by mouth daily.   Yes [provider]  bismuth subsalicylate (PEPTO BISMOL) 262 MG chewable tablet Chew 524 mg by mouth 3 (three) times daily as needed for indigestion.    Yes [provider]  citalopram (CELEXA) 20 MG tablet Take 20 mg by mouth daily.   Yes [provider]  diphenhydrAMINE (BENADRYL) 25 MG tablet Take 25 mg by mouth daily as needed for itching or allergies.    Yes [provider]  loperamide (IMODIUM A-D) 2 MG tablet Take 2 mg by mouth daily as needed for diarrhea or loose stools.   Yes [provider]   mometasone-formoterol (DULERA) 200-5 MCG/ACT AERO Inhale 2 puffs into the lungs 2 (two) times daily. 08/16/17  Yes Soyla Dryer, PA-C  oxymetazoline (AFRIN) 0.05 % nasal spray Place 2 sprays into both nostrils daily as needed for congestion.   Yes [provider]  PROVENTIL HFA 108 (90 Base) MCG/ACT inhaler INHALE 2 PUFFS BY MOUTH EVERY 6 HOURS AS NEEDED FOR COUGHING, WHEEZING, OR SHORTNESS OF BREATH Patient taking differently: Inhale 2 puffs into the lungs every 6 (six) hours as needed for wheezing or shortness of breath (or coughing).  11/13/17  Yes Soyla Dryer, PA-C  tetrahydrozoline 0.05 % ophthalmic solution Place 1 drop into both eyes as needed (for dry eyes).    Yes [provider]  traZODone (DESYREL) 100 MG tablet Take 200 mg by mouth at bedtime as needed for sleep.    Yes [provider]  vitamin B-12 (CYANOCOBALAMIN) 500 MCG tablet Take 500 mcg by mouth daily.   Yes [provider]  chlordiazePOXIDE (LIBRIUM) 25 MG capsule 50mg  PO TID x 1D, then 25-50mg  PO BID X 1D, then 25-50mg  PO QD X 1D Patient not taking: Reported on 12/06/2019 04/28/19   Couture, Cortni S, PA-C  famotidine (PEPCID) 20 MG tablet Take 1 tablet (20 mg total) by mouth 2 (two) times daily. Patient not taking: Reported on 12/06/2019 03/29/19   Francine Graven, DO  metoprolol tartrate (LOPRESSOR) 100 MG tablet TAKE 1 Tablet  BY MOUTH TWICE DAILY Patient not taking: Reported on 12/06/2019 11/13/17   Soyla Dryer, PA-C  ondansetron (ZOFRAN ODT) 4 MG disintegrating tablet 4mg  ODT q4 hours prn nausea/vomit Patient not taking: Reported on 12/06/2019 04/22/18   Milton Ferguson, MD  ondansetron (ZOFRAN) 4 MG tablet Take 1 tablet (4 mg total) by mouth every 6 (six) hours. 12/06/19   Garald Balding, PA-C  pantoprazole (PROTONIX) 20 MG tablet Take 1 tablet (20 mg total) by mouth daily for 14 days. Patient not taking: Reported on 12/06/2019 04/28/19 12/06/19  Couture, Cortni S, PA-C  sildenafil  (VIAGRA) 100 MG tablet Take 0.5-1 tablets (50-100 mg total) by mouth daily as needed for erectile dysfunction. Patient not taking: Reported on 12/06/2019 12/28/17   Soyla Dryer, PA-C  sucralfate (CARAFATE) 1 g tablet Take 1 tablet (1 g total) by mouth 3 (three) times daily with meals for 14 days. Patient not taking: Reported on 12/06/2019 04/28/19 12/06/19  Couture, Cortni S, PA-C  traMADol (ULTRAM) 50 MG tablet Take 1 tablet (50 mg total) by mouth every 6 (six) hours as needed. Patient not taking: Reported on 12/06/2019 04/22/18   Milton Ferguson, MD    Allergies    Other  Review of Systems   Review of Systems  Constitutional: Positive for appetite change. Negative for chills, fatigue and fever.  HENT: Negative for ear pain and sore throat.  Eyes: Negative for pain and visual disturbance.  Respiratory: Negative for cough and shortness of breath.   Cardiovascular: Negative for chest pain and palpitations.  Gastrointestinal: Positive for abdominal pain, diarrhea, nausea and vomiting.  Genitourinary: Negative for dysuria and hematuria.  Musculoskeletal: Negative for arthralgias and back pain.  Skin: Negative for color change and rash.  Neurological: Negative for dizziness, seizures, syncope, weakness, light-headedness and headaches.  Psychiatric/Behavioral: Negative for confusion.  All other systems reviewed and are negative.   Physical Exam Updated Vital Signs BP (!) 153/83   Pulse 98   Temp 97.9 F (36.6 C) (Oral)   Resp 15   Ht 5\' 9"  (1.753 m)   Wt 70 kg   SpO2 97%   BMI 22.79 kg/m   Physical Exam Vitals and nursing note reviewed.  Constitutional:      Appearance: He is well-developed.  HENT:     Head: Normocephalic and atraumatic.     Mouth/Throat:     Mouth: Mucous membranes are moist.     Pharynx: Oropharynx is clear.  Eyes:     Extraocular Movements: Extraocular movements intact.     Conjunctiva/sclera: Conjunctivae normal.     Pupils: Pupils are equal, round,  and reactive to light.  Cardiovascular:     Rate and Rhythm: Normal rate and regular rhythm.     Pulses: Normal pulses.          Radial pulses are 2+ on the right side and 2+ on the left side.       Dorsalis pedis pulses are 2+ on the right side and 2+ on the left side.     Heart sounds: Normal heart sounds. No murmur.  Pulmonary:     Effort: Pulmonary effort is normal. No respiratory distress.     Breath sounds: Normal breath sounds.  Chest:     Chest wall: No tenderness.  Abdominal:     General: Abdomen is flat. Bowel sounds are normal.     Palpations: Abdomen is soft.     Tenderness: There is no abdominal tenderness. There is no right CVA tenderness, left CVA tenderness, guarding or rebound. Negative signs include Murphy's sign, Rovsing's sign and McBurney's sign.     Hernia: No hernia is present.  Musculoskeletal:     Cervical back: Neck supple.     Right lower leg: No edema.     Left lower leg: No edema.  Skin:    General: Skin is warm and dry.     Capillary Refill: Capillary refill takes less than 2 seconds.  Neurological:     General: No focal deficit present.     Mental Status: He is alert.  Psychiatric:        Mood and Affect: Mood normal.        Behavior: Behavior normal.     ED Results / Procedures / Treatments   Labs (all labs ordered are listed, but only abnormal results are displayed) Labs Reviewed  LIPASE, BLOOD - Abnormal; Notable for the following components:      Result Value   Lipase 65 (*)    All other components within normal limits  COMPREHENSIVE METABOLIC PANEL - Abnormal; Notable for the following components:   Chloride 97 (*)    Glucose, Bld 102 (*)    Total Protein 8.3 (*)    Total Bilirubin 2.1 (*)    All other components within normal limits  CBC - Abnormal; Notable for the following components:   WBC 11.4 (*)  Hemoglobin 17.2 (*)    HCT 52.9 (*)    All other components within normal limits  URINALYSIS, ROUTINE W REFLEX MICROSCOPIC -  Abnormal; Notable for the following components:   Color, Urine AMBER (*)    APPearance HAZY (*)    Ketones, ur 20 (*)    Protein, ur 100 (*)    Leukocytes,Ua TRACE (*)    All other components within normal limits    EKG EKG Interpretation  Date/Time:  Friday December 06 2019 22:29:01 EDT Ventricular Rate:  90 PR Interval:    QRS Duration: 80 QT Interval:  378 QTC Calculation: 463 R Axis:   78 Text Interpretation: Sinus rhythm Probable left atrial enlargement Borderline T abnormalities, anterior leads similar to Sept 2020 Confirmed by Sherwood Gambler [phone]) on 12/06/2019 11:17:37 PM   Radiology No results found.  Procedures Procedures (including critical care time)  Medications Ordered in ED Medications  sodium chloride flush (NS) 0.9 % injection 3 mL (0 mLs Intravenous Hold 12/06/19 2129)  famotidine (PEPCID) IVPB 20 mg premix (20 mg Intravenous New Bag/Given 12/06/19 2259)  sodium chloride 0.9 % bolus 1,000 mL (1,000 mLs Intravenous New Bag/Given 12/06/19 2205)  alum & mag hydroxide-simeth (MAALOX/MYLANTA) 200-200-20 MG/5ML suspension 30 mL (30 mLs Oral Given 12/06/19 2217)    And  lidocaine (XYLOCAINE) 2 % viscous mouth solution 15 mL (15 mLs Oral Given 12/06/19 2217)  ondansetron (ZOFRAN) injection 4 mg (4 mg Intravenous Given 12/06/19 2259)    ED Course  I have reviewed the triage vital signs and the nursing notes.  Pertinent labs & imaging results that were available during my care of the patient were reviewed by me and considered in my medical decision making (see chart for details).    MDM Rules/Calculators/A&P                     59 year old with midepigastric nausea, vomiting, diarrhea x2 days. On presentation to the ER, patient is alert and oriented, in no acute distress, not actively vomiting, speaking in full sentences without increased work of breathing.  Abdomen soft and nontender, special tests negative.  Patient slightly hypertensive consistent with his  hypertension diagnosis but other vitals reassuring.  Differential diagnosis of epigastric pain includes: Functional or nonulcer dyspepsia ([facility]), PUD, GERD, Gastritis, (NSAIDs, alcohol, stress, H. pylori, pernicious anemia), pancreatitis or pancreatic cancer, overeating indigestion (high-fat foods, coffee), drugs (aspirin, antibiotics (eg, macrolides, metronidazole), corticosteroids, digoxin, narcotics, theophylline), gastroparesis, lactose intolerance, malabsorption gastric cancer, parasitic infection, (Giardia, Strongyloides, Ascaris) cholelithiasis, choledocholithiasis, or cholangitis, ACS, pericarditis, pneumonia, abdominal hernia, pregnancy, intestinal ischemia, esophageal rupture, gastric volvulus, hepatitis.  CMP without significant electrolyte abnormalities, mildly elevated glucose, normal AST ALT.  Elevated bilirubin likely not contributory.  Lipase only slightly elevated at 65 which appears to be at baseline.  Mild leukocytosis, normal hemoglobin.  UA without evidence of UTI, possible dehydration with evidence of ketones.  EKG normal sinus rhythm with t wave abnormalities or ST elevations.  Clinical picture consistent with GERD .  Patient admits that he has not been taking his PPI medication.  He states that he has been working on getting a family doctor and has just finally established with one.  Pending first appointment.  We will treat symptomatically, doubt acute abdomen, ACS.  Patient's abdomen is nontender, do not think he needs additional imaging of the at this time.  Will treat with GI cocktail, fluids, Zofran, Pepcid.  Upon positive response to treatment, anticipate the patient will be stable for discharge.  Patient tolerated treatment well.  On serial abdominal reexamination, patient has no focal tenderness or generalized tenderness.  He is feeling much better at this time.  Tolerating p.o. well.  At this stage in the ED course, patient has been adequately screened and does not require  any additional imaging or lab work.  I discussed taking over-the-counter omeprazole or other PPI with the patient, patient voices understanding and is agreeable.  Stressed follow-up with PCP, patient states he will call on Monday to make an appointment.  Strict return precautions given.  At this stage in ED course, patient has been medically screened and is stable for discharge.     Final Clinical Impression(s) / ED Diagnoses Final diagnoses:  Gastroesophageal reflux disease, unspecified whether esophagitis present    Rx / DC Orders ED Discharge Orders         Ordered    ondansetron (ZOFRAN) 4 MG tablet  Every 6 hours     12/06/19 2310           Lyndel Safe 12/06/19 2328    Sherwood Gambler, MD 12/09/19 1627

## 2019-12-06 NOTE — Discharge Instructions (Addendum)
Please make sure to take  the over-the-counter omeprazole or any other proton pump inhibitor as discussed.  Make sure to call and schedule an appointment with your primary care doctor on Monday. Make sure to drink lots of fluids, avoid fatty and spicy foods that could trigger your GERD.  Please see handout for dietary recommendations.  Please try to decrease your consumption of alcohol as this can also exacerbate your symptoms.  I have prescribed some nausea medication, take as needed. Please return to the ER if your symptoms worsen.

## 2019-12-06 NOTE — ED Notes (Signed)
Patient verbalizes understanding of discharge instructions. Opportunity for questioning and answers were provided. Armband removed by staff, pt discharged from ED and ambulated to car.

## 2019-12-06 NOTE — ED Triage Notes (Signed)
Abdominal pain onset last night, history of acid reflux

## 2019-12-06 NOTE — ED Triage Notes (Signed)
Patient reports mid abdominal pain with acid reflux and emesis onset yesterday , denies diarrhea or fever .

## 2020-03-13 DIAGNOSIS — K219 Gastro-esophageal reflux disease without esophagitis: Secondary | ICD-10-CM | POA: Diagnosis not present

## 2020-03-13 DIAGNOSIS — I10 Essential (primary) hypertension: Secondary | ICD-10-CM | POA: Diagnosis not present

## 2020-04-13 DIAGNOSIS — J449 Chronic obstructive pulmonary disease, unspecified: Secondary | ICD-10-CM | POA: Diagnosis not present

## 2020-04-13 DIAGNOSIS — I10 Essential (primary) hypertension: Secondary | ICD-10-CM | POA: Diagnosis not present

## 2020-06-04 ENCOUNTER — Encounter (HOSPITAL_COMMUNITY): Payer: Self-pay | Admitting: Emergency Medicine

## 2020-06-04 ENCOUNTER — Other Ambulatory Visit: Payer: Self-pay

## 2020-06-04 ENCOUNTER — Inpatient Hospital Stay (HOSPITAL_COMMUNITY)
Admission: EM | Admit: 2020-06-04 | Discharge: 2020-06-06 | DRG: 439 | Disposition: A | Discharge status: Home / Self Care | Payer: Medicare HMO | Attending: Family Medicine | Admitting: Family Medicine

## 2020-06-04 ENCOUNTER — Emergency Department (HOSPITAL_COMMUNITY): Payer: Medicare HMO

## 2020-06-04 DIAGNOSIS — Z8249 Family history of ischemic heart disease and other diseases of the circulatory system: Secondary | ICD-10-CM | POA: Diagnosis not present

## 2020-06-04 DIAGNOSIS — Z7951 Long term (current) use of inhaled steroids: Secondary | ICD-10-CM | POA: Diagnosis not present

## 2020-06-04 DIAGNOSIS — K852 Alcohol induced acute pancreatitis without necrosis or infection: Secondary | ICD-10-CM | POA: Diagnosis not present

## 2020-06-04 DIAGNOSIS — D72829 Elevated white blood cell count, unspecified: Secondary | ICD-10-CM | POA: Diagnosis not present

## 2020-06-04 DIAGNOSIS — R9431 Abnormal electrocardiogram [ECG] [EKG]: Secondary | ICD-10-CM | POA: Diagnosis not present

## 2020-06-04 DIAGNOSIS — Z825 Family history of asthma and other chronic lower respiratory diseases: Secondary | ICD-10-CM | POA: Diagnosis not present

## 2020-06-04 DIAGNOSIS — Z833 Family history of diabetes mellitus: Secondary | ICD-10-CM | POA: Diagnosis not present

## 2020-06-04 DIAGNOSIS — K859 Acute pancreatitis without necrosis or infection, unspecified: Secondary | ICD-10-CM | POA: Diagnosis not present

## 2020-06-04 DIAGNOSIS — E876 Hypokalemia: Secondary | ICD-10-CM | POA: Diagnosis not present

## 2020-06-04 DIAGNOSIS — R000 Tachycardia, unspecified: Secondary | ICD-10-CM | POA: Diagnosis not present

## 2020-06-04 DIAGNOSIS — F10239 Alcohol dependence with withdrawal, unspecified: Secondary | ICD-10-CM | POA: Diagnosis not present

## 2020-06-04 DIAGNOSIS — Z79899 Other long term (current) drug therapy: Secondary | ICD-10-CM | POA: Diagnosis not present

## 2020-06-04 DIAGNOSIS — Z20822 Contact with and (suspected) exposure to covid-19: Secondary | ICD-10-CM | POA: Diagnosis not present

## 2020-06-04 DIAGNOSIS — F17218 Nicotine dependence, cigarettes, with other nicotine-induced disorders: Secondary | ICD-10-CM | POA: Diagnosis present

## 2020-06-04 DIAGNOSIS — J439 Emphysema, unspecified: Secondary | ICD-10-CM | POA: Diagnosis not present

## 2020-06-04 DIAGNOSIS — Z7982 Long term (current) use of aspirin: Secondary | ICD-10-CM

## 2020-06-04 DIAGNOSIS — K219 Gastro-esophageal reflux disease without esophagitis: Secondary | ICD-10-CM | POA: Diagnosis present

## 2020-06-04 DIAGNOSIS — I700 Atherosclerosis of aorta: Secondary | ICD-10-CM | POA: Diagnosis not present

## 2020-06-04 DIAGNOSIS — I10 Essential (primary) hypertension: Secondary | ICD-10-CM | POA: Diagnosis present

## 2020-06-04 DIAGNOSIS — K860 Alcohol-induced chronic pancreatitis: Secondary | ICD-10-CM

## 2020-06-04 DIAGNOSIS — K862 Cyst of pancreas: Secondary | ICD-10-CM

## 2020-06-04 DIAGNOSIS — J449 Chronic obstructive pulmonary disease, unspecified: Secondary | ICD-10-CM | POA: Diagnosis present

## 2020-06-04 DIAGNOSIS — F1010 Alcohol abuse, uncomplicated: Secondary | ICD-10-CM | POA: Diagnosis not present

## 2020-06-04 DIAGNOSIS — K8689 Other specified diseases of pancreas: Secondary | ICD-10-CM | POA: Diagnosis not present

## 2020-06-04 DIAGNOSIS — R935 Abnormal findings on diagnostic imaging of other abdominal regions, including retroperitoneum: Secondary | ICD-10-CM | POA: Diagnosis not present

## 2020-06-04 LAB — RESPIRATORY PANEL BY RT PCR (FLU A&B, COVID)
Influenza A by PCR: NEGATIVE
Influenza B by PCR: NEGATIVE
SARS Coronavirus 2 by RT PCR: NEGATIVE

## 2020-06-04 LAB — COMPREHENSIVE METABOLIC PANEL
ALT: 32 U/L (ref 0–44)
AST: 69 U/L — ABNORMAL HIGH (ref 15–41)
Albumin: 4.4 g/dL (ref 3.5–5.0)
Alkaline Phosphatase: 88 U/L (ref 38–126)
Anion gap: 17 — ABNORMAL HIGH (ref 5–15)
BUN: 5 mg/dL — ABNORMAL LOW (ref 6–20)
CO2: 30 mmol/L (ref 22–32)
Calcium: 9.4 mg/dL (ref 8.9–10.3)
Chloride: 88 mmol/L — ABNORMAL LOW (ref 98–111)
Creatinine, Ser: 0.73 mg/dL (ref 0.61–1.24)
GFR, Estimated: >60 mL/min (ref >60)
Glucose, Bld: 203 mg/dL — ABNORMAL HIGH (ref 70–99)
Potassium: 3.1 mmol/L — ABNORMAL LOW (ref 3.5–5.1)
Sodium: 135 mmol/L (ref 135–145)
Total Bilirubin: 1.1 mg/dL (ref 0.3–1.2)
Total Protein: 8.6 g/dL — ABNORMAL HIGH (ref 6.5–8.1)

## 2020-06-04 LAB — CBC
HCT: 48.2 % (ref 39.0–52.0)
Hemoglobin: 16.3 g/dL (ref 13.0–17.0)
MCH: 31.3 pg (ref 26.0–34.0)
MCHC: 33.8 g/dL (ref 30.0–36.0)
MCV: 92.7 fL (ref 80.0–100.0)
Platelets: 232 10*3/uL (ref 150–400)
RBC: 5.2 MIL/uL (ref 4.22–5.81)
RDW: 15.2 % (ref 11.5–15.5)
WBC: 10.9 10*3/uL — ABNORMAL HIGH (ref 4.0–10.5)
nRBC: 0 % (ref 0.0–0.2)

## 2020-06-04 LAB — RAPID URINE DRUG SCREEN, HOSP PERFORMED
Amphetamines: NOT DETECTED
Barbiturates: NOT DETECTED
Benzodiazepines: NOT DETECTED
Cocaine: NOT DETECTED
Opiates: NOT DETECTED
Tetrahydrocannabinol: POSITIVE — AB

## 2020-06-04 LAB — URINALYSIS, ROUTINE W REFLEX MICROSCOPIC
Bacteria, UA: NONE SEEN
Bilirubin Urine: NEGATIVE
Glucose, UA: 50 mg/dL — AB
Hgb urine dipstick: NEGATIVE
Ketones, ur: 20 mg/dL — AB
Leukocytes,Ua: NEGATIVE
Nitrite: NEGATIVE
Protein, ur: 100 mg/dL — AB
Specific Gravity, Urine: 1.015 (ref 1.005–1.030)
pH: 9 — ABNORMAL HIGH (ref 5.0–8.0)

## 2020-06-04 LAB — HIV ANTIBODY (ROUTINE TESTING W REFLEX): HIV Screen 4th Generation wRfx: NONREACTIVE

## 2020-06-04 LAB — LIPASE, BLOOD: Lipase: 1888 U/L — ABNORMAL HIGH (ref 11–51)

## 2020-06-04 LAB — MAGNESIUM: Magnesium: 2.6 mg/dL — ABNORMAL HIGH (ref 1.7–2.4)

## 2020-06-04 IMAGING — CT CT ABD-PELV W/ CM
2 of 6 series · 16 of 46 positions shown, 18 images · IV contrast (Omnipaque or Isovue)
Comparison: CT abdomen pelvis dated [DATE]

CLINICAL DATA: Abdominal pain with nausea, vomiting and diarrhea.
Concern for pancreatitis.

EXAM:
CT ABDOMEN AND PELVIS WITH CONTRAST
TECHNIQUE: Multidetector CT imaging of the abdomen and pelvis was performed
using the standard protocol following bolus administration of
intravenous contrast.
CONTRAST:  100mL OMNIPAQUE IOHEXOL 300 MG/ML  SOLN

[Series 2: axial st · axial · 0.65mm/px · z∈[+841,+1216]mm · 13 of 87 slices shown, 15 images]
[im 6/87  soft-tissue]
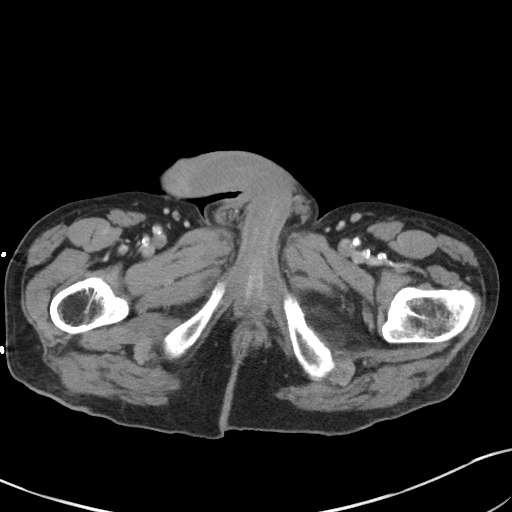
[im 6/87  bone]
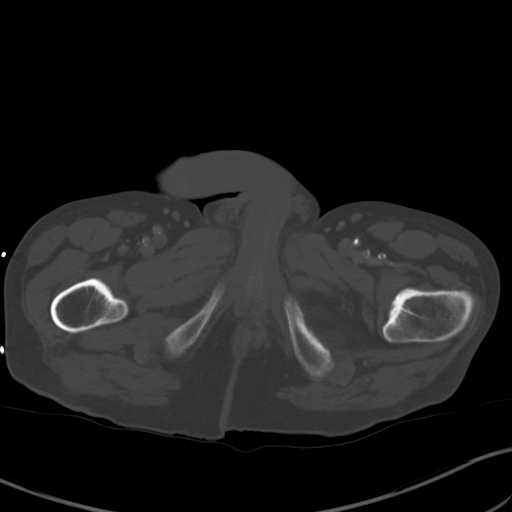
[im 11/87  soft-tissue]
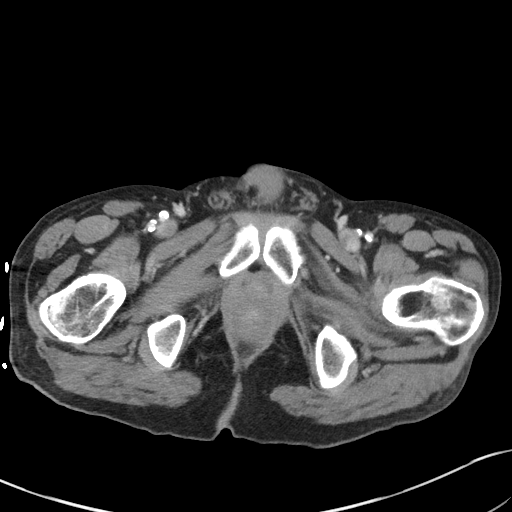
[im 17/87  soft-tissue]
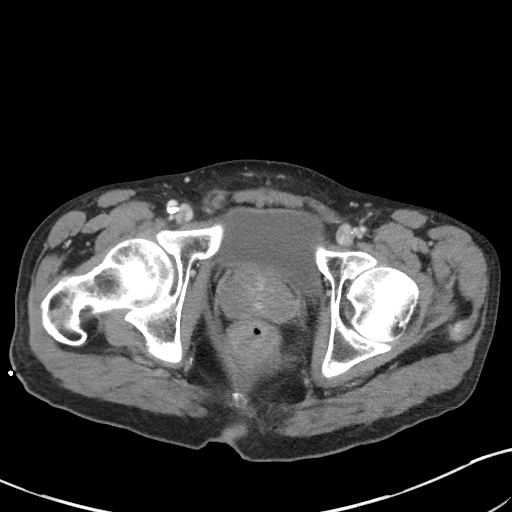
[im 27/87  soft-tissue]
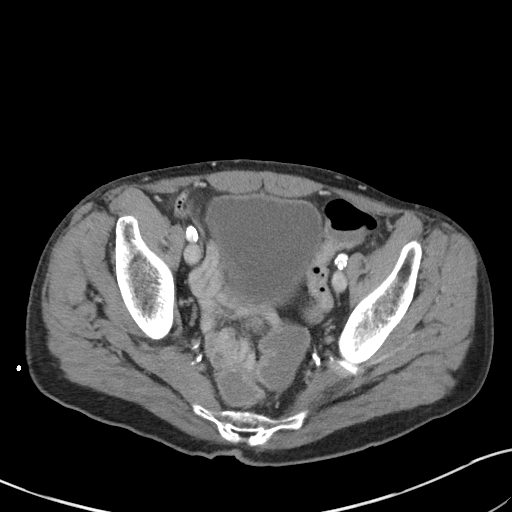
[im 33/87  soft-tissue]
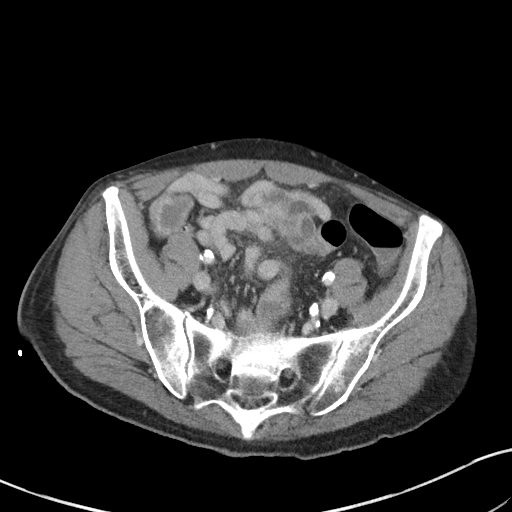
[im 38/87  soft-tissue]
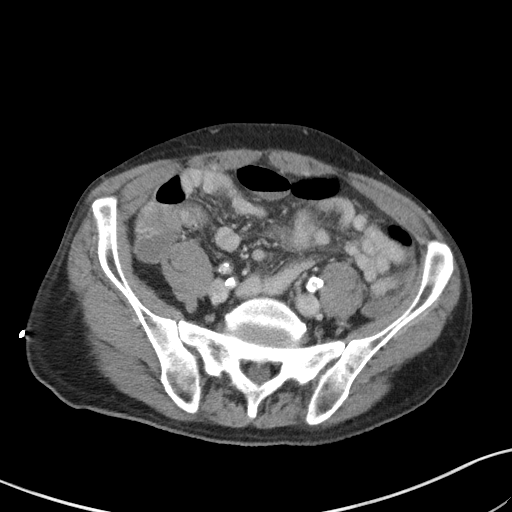
[im 44/87  soft-tissue]
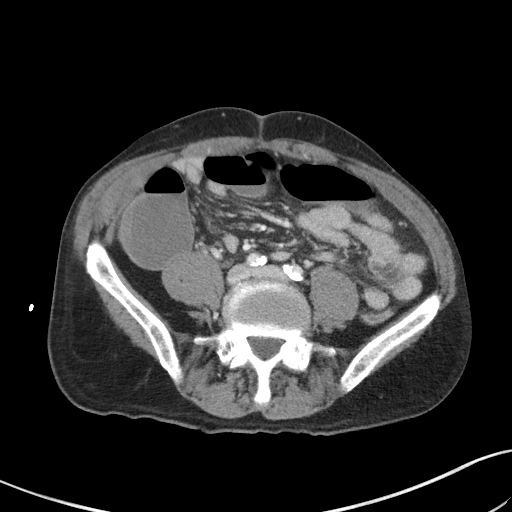
[im 49/87  soft-tissue]
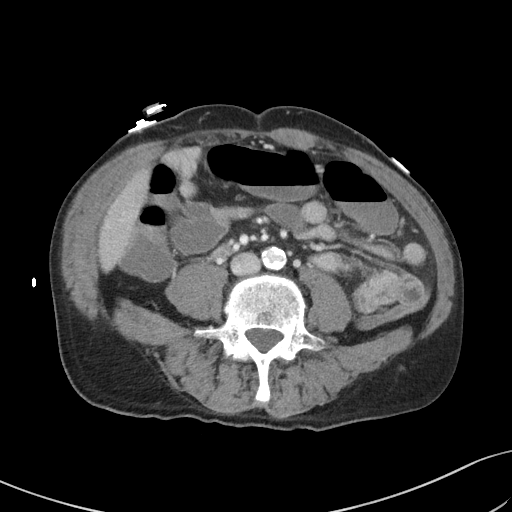
[im 54/87  soft-tissue]
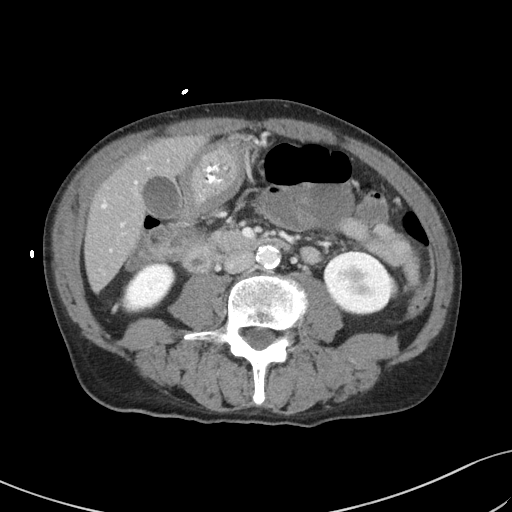
[im 54/87  bone]
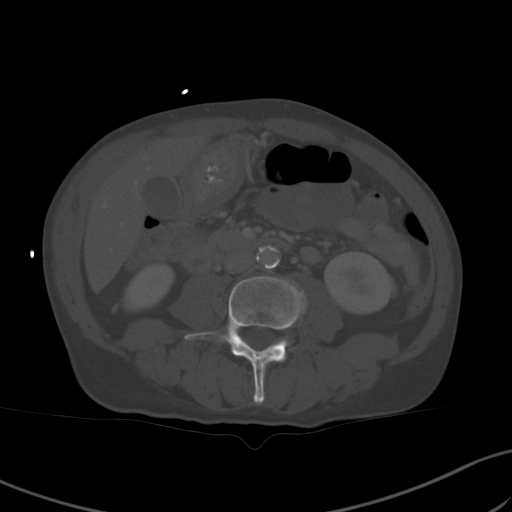
[im 60/87  soft-tissue]
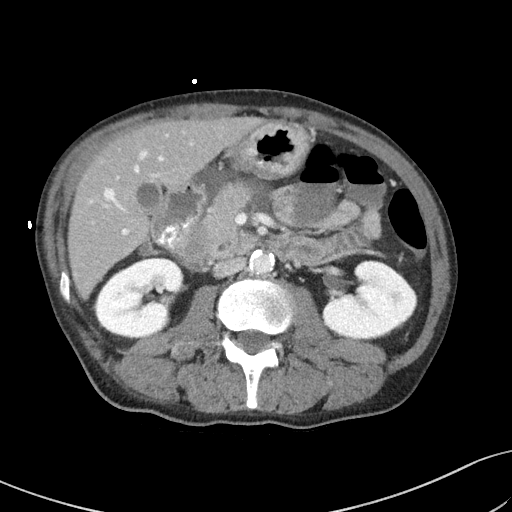
[im 70/87  soft-tissue]
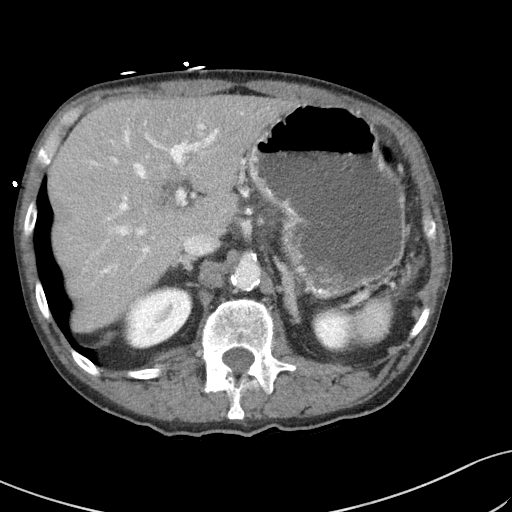
[im 76/87  soft-tissue]
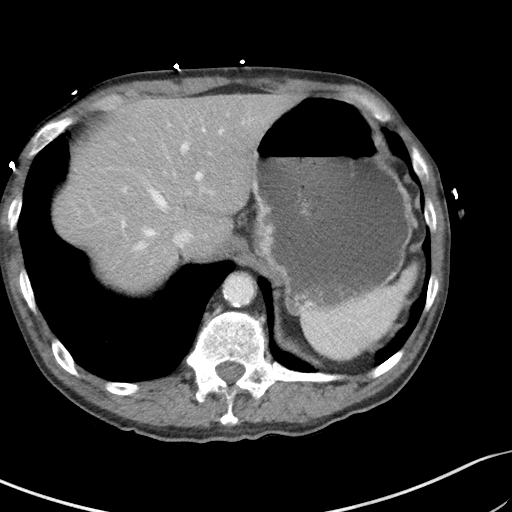
[im 81/87  soft-tissue]
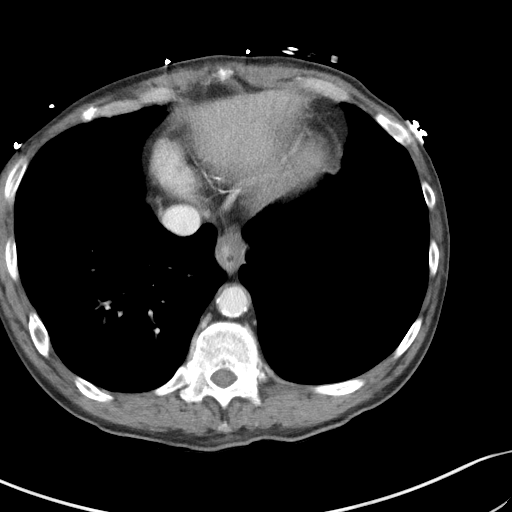

[Series 4: coronal st · coronal · 0.67mm/px · 3 of 93 slices shown]
[im 31/93  soft-tissue]
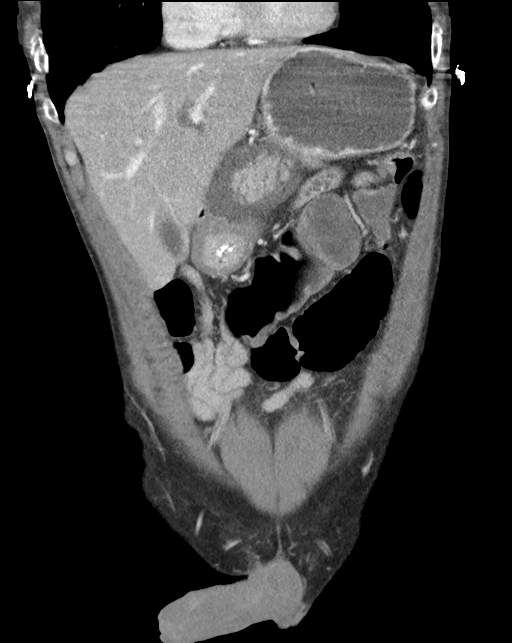
[im 41/93  soft-tissue]
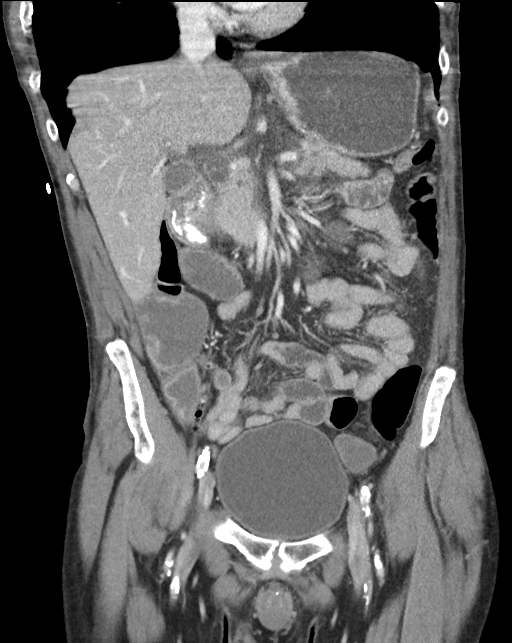
[im 52/93  soft-tissue]
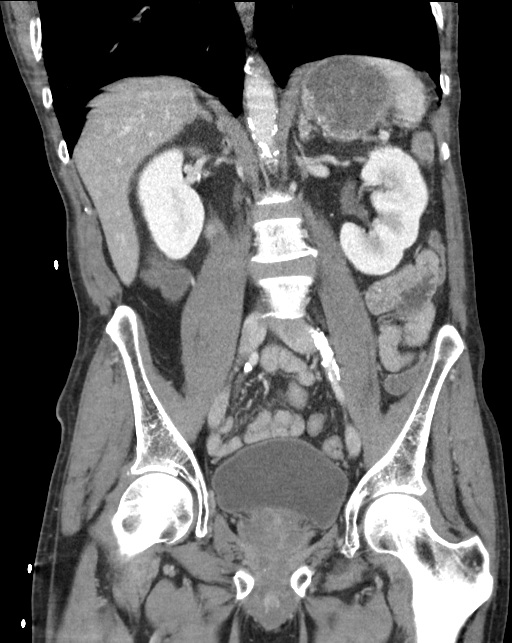

[16 of 46 positions shown; findings below may reference images not displayed]

FINDINGS: Lower chest: No acute abnormality.

Hepatobiliary: No focal liver abnormality is seen. No gallstones or
gallbladder wall thickening. The common bile duct is dilated,
measuring up to 10 mm.

Pancreas: There is edema and surrounding inflammatory changes
involving the pancreatic body and head. The pancreatic parenchyma
enhances without evidence of necrosis. Hypoattenuation in the groove
between the pancreatic head and the duodenum (series 2 images 28-30)
is favored to reflect inflammatory changes, however an underlying
mass such as an acute peripancreatic fluid collection or malignancy.
The pancreatic duct is at the upper limits of normal but is not
appear dilated.

Spleen: Normal in size without focal abnormality.

Adrenals/Urinary Tract: Adrenal glands are unremarkable. Kidneys are
normal, without renal calculi, focal lesion, or hydronephrosis.
Bladder is unremarkable.

Stomach/Bowel: Stomach is within normal limits. Appendix appears
normal. No evidence of bowel wall thickening, distention, or
inflammatory changes.

Vascular/Lymphatic: No significant vascular findings are present. No
enlarged abdominal or pelvic lymph nodes. No evidence of portal vein
thrombosis or splenic artery aneurysm.

Reproductive: The prostate is enlarged, measuring 5.2 cm in
transverse dimension.

Other: No abdominal wall hernia or abnormality. No abdominopelvic
ascites.

Musculoskeletal: No acute or significant osseous findings.
IMPRESSION: 1. Acute interstitial edematous pancreatitis. Hypoattenuation in the
groove between the pancreatic head and the duodenum is favored to
reflect inflammatory changes, however an underlying mass such as an
acute peripancreatic fluid collection or underlying malignancy could
have a similar appearance. Follow-up MR abdomen could be considered
to document resolution of this finding after the patient's acute
episode has resolved.
2. Dilated common bile duct.

Aortic Atherosclerosis ([RU]-[RU]).

## 2020-06-04 MED ORDER — ONDANSETRON HCL 4 MG/2ML IJ SOLN
4.0000 mg | Freq: Once | INTRAMUSCULAR | Status: AC
  Administered 2020-06-04: 4 mg via INTRAVENOUS
  Filled 2020-06-04: qty 2

## 2020-06-04 MED ORDER — POTASSIUM CHLORIDE CRYS ER 20 MEQ PO TBCR
40.0000 meq | EXTENDED_RELEASE_TABLET | Freq: Once | ORAL | Status: AC
  Administered 2020-06-04: 40 meq via ORAL
  Filled 2020-06-04: qty 2

## 2020-06-04 MED ORDER — THIAMINE HCL 100 MG PO TABS
100.0000 mg | ORAL_TABLET | Freq: Every day | ORAL | Status: DC
  Administered 2020-06-04 – 2020-06-06 (×3): 100 mg via ORAL
  Filled 2020-06-04 (×3): qty 1

## 2020-06-04 MED ORDER — ALUM & MAG HYDROXIDE-SIMETH 200-200-20 MG/5ML PO SUSP
30.0000 mL | Freq: Once | ORAL | Status: AC
  Administered 2020-06-04: 30 mL via ORAL
  Filled 2020-06-04: qty 30

## 2020-06-04 MED ORDER — DIPHENHYDRAMINE HCL 25 MG PO CAPS: 25.0000 mg | ORAL_CAPSULE | Freq: Every day | ORAL | Status: DC | PRN

## 2020-06-04 MED ORDER — PROCHLORPERAZINE EDISYLATE 10 MG/2ML IJ SOLN: 10.0000 mg | INTRAMUSCULAR | Status: DC | PRN

## 2020-06-04 MED ORDER — POTASSIUM CHLORIDE IN NACL 20-0.9 MEQ/L-% IV SOLN: INTRAVENOUS | Status: DC

## 2020-06-04 MED ORDER — LORAZEPAM 1 MG PO TABS: 1.0000 mg | ORAL_TABLET | ORAL | Status: DC | PRN

## 2020-06-04 MED ORDER — PANTOPRAZOLE SODIUM 40 MG IV SOLR
40.0000 mg | INTRAVENOUS | Status: DC
  Administered 2020-06-04: 40 mg via INTRAVENOUS
  Filled 2020-06-04: qty 40

## 2020-06-04 MED ORDER — HYDROMORPHONE HCL 1 MG/ML IJ SOLN
0.2500 mg | INTRAMUSCULAR | Status: DC | PRN
  Administered 2020-06-04 – 2020-06-05 (×2): 0.5 mg via INTRAVENOUS
  Filled 2020-06-04 (×2): qty 0.5

## 2020-06-04 MED ORDER — MOMETASONE FURO-FORMOTEROL FUM 200-5 MCG/ACT IN AERO
2.0000 | INHALATION_SPRAY | Freq: Two times a day (BID) | RESPIRATORY_TRACT | Status: DC
  Administered 2020-06-04 – 2020-06-06 (×4): 2 via RESPIRATORY_TRACT
  Filled 2020-06-04: qty 8.8

## 2020-06-04 MED ORDER — ONDANSETRON HCL 4 MG PO TABS: 4.0000 mg | ORAL_TABLET | Freq: Four times a day (QID) | ORAL | Status: DC | PRN

## 2020-06-04 MED ORDER — METOPROLOL TARTRATE 5 MG/5ML IV SOLN
5.0000 mg | Freq: Four times a day (QID) | INTRAVENOUS | Status: DC
  Administered 2020-06-04 – 2020-06-05 (×4): 5 mg via INTRAVENOUS
  Filled 2020-06-04 (×4): qty 5

## 2020-06-04 MED ORDER — ALUM & MAG HYDROXIDE-SIMETH 200-200-20 MG/5ML PO SUSP: 30.0000 mL | Freq: Four times a day (QID) | ORAL | Status: DC | PRN

## 2020-06-04 MED ORDER — CHLORDIAZEPOXIDE HCL 25 MG PO CAPS
25.0000 mg | ORAL_CAPSULE | Freq: Three times a day (TID) | ORAL | Status: DC
  Administered 2020-06-04 – 2020-06-06 (×6): 25 mg via ORAL
  Filled 2020-06-04 (×6): qty 1

## 2020-06-04 MED ORDER — AMLODIPINE BESYLATE 5 MG PO TABS
5.0000 mg | ORAL_TABLET | Freq: Every day | ORAL | Status: DC
  Administered 2020-06-05 – 2020-06-06 (×2): 5 mg via ORAL
  Filled 2020-06-04 (×2): qty 1

## 2020-06-04 MED ORDER — ALBUTEROL SULFATE HFA 108 (90 BASE) MCG/ACT IN AERS: 2.0000 | INHALATION_SPRAY | Freq: Four times a day (QID) | RESPIRATORY_TRACT | Status: DC | PRN

## 2020-06-04 MED ORDER — SODIUM CHLORIDE 0.9 % IV BOLUS
1000.0000 mL | Freq: Once | INTRAVENOUS | Status: AC
  Administered 2020-06-04: 1000 mL via INTRAVENOUS

## 2020-06-04 MED ORDER — PNEUMOCOCCAL VAC POLYVALENT 25 MCG/0.5ML IJ INJ
0.5000 mL | INJECTION | INTRAMUSCULAR | Status: DC | PRN
Start: 2020-06-04 — End: 2020-06-06

## 2020-06-04 MED ORDER — ENOXAPARIN SODIUM 40 MG/0.4ML ~~LOC~~ SOLN
40.0000 mg | SUBCUTANEOUS | Status: DC
  Administered 2020-06-04 – 2020-06-05 (×2): 40 mg via SUBCUTANEOUS
  Filled 2020-06-04 (×2): qty 0.4

## 2020-06-04 MED ORDER — ASPIRIN EC 81 MG PO TBEC
81.0000 mg | DELAYED_RELEASE_TABLET | Freq: Every day | ORAL | Status: DC
  Administered 2020-06-05 – 2020-06-06 (×2): 81 mg via ORAL
  Filled 2020-06-04 (×2): qty 1

## 2020-06-04 MED ORDER — THIAMINE HCL 100 MG/ML IJ SOLN: 100.0000 mg | Freq: Every day | INTRAMUSCULAR | Status: DC

## 2020-06-04 MED ORDER — NICOTINE 21 MG/24HR TD PT24
21.0000 mg | MEDICATED_PATCH | Freq: Every day | TRANSDERMAL | Status: DC
  Administered 2020-06-04 – 2020-06-06 (×3): 21 mg via TRANSDERMAL
  Filled 2020-06-04 (×3): qty 1

## 2020-06-04 MED ORDER — LORAZEPAM 2 MG/ML IJ SOLN: 1.0000 mg | INTRAMUSCULAR | Status: DC | PRN

## 2020-06-04 MED ORDER — IOHEXOL 300 MG/ML  SOLN
100.0000 mL | Freq: Once | INTRAMUSCULAR | Status: AC | PRN
  Administered 2020-06-04: 100 mL via INTRAVENOUS

## 2020-06-04 MED ORDER — DIPHENHYDRAMINE HCL 25 MG PO TABS
12.5000 mg | ORAL_TABLET | Freq: Every day | ORAL | Status: DC | PRN
  Filled 2020-06-04: qty 0.5

## 2020-06-04 MED ORDER — VITAMIN B-12 1000 MCG PO TABS
500.0000 ug | ORAL_TABLET | Freq: Every day | ORAL | Status: DC
  Administered 2020-06-04 – 2020-06-06 (×3): 500 ug via ORAL
  Filled 2020-06-04 (×3): qty 1

## 2020-06-04 MED ORDER — ADULT MULTIVITAMIN W/MINERALS CH
1.0000 | ORAL_TABLET | Freq: Every day | ORAL | Status: DC
  Administered 2020-06-04 – 2020-06-06 (×3): 1 via ORAL
  Filled 2020-06-04 (×3): qty 1

## 2020-06-04 MED ORDER — LORAZEPAM 2 MG/ML IJ SOLN: 0.0000 mg | Freq: Two times a day (BID) | INTRAMUSCULAR | Status: DC

## 2020-06-04 MED ORDER — FOLIC ACID 1 MG PO TABS
1.0000 mg | ORAL_TABLET | Freq: Every day | ORAL | Status: DC
  Administered 2020-06-04 – 2020-06-06 (×3): 1 mg via ORAL
  Filled 2020-06-04 (×4): qty 1

## 2020-06-04 MED ORDER — MORPHINE SULFATE (PF) 4 MG/ML IV SOLN
4.0000 mg | Freq: Once | INTRAVENOUS | Status: AC
  Administered 2020-06-04: 4 mg via INTRAVENOUS
  Filled 2020-06-04: qty 1

## 2020-06-04 MED ORDER — INFLUENZA VAC SPLIT QUAD 0.5 ML IM SUSY: 0.5000 mL | PREFILLED_SYRINGE | INTRAMUSCULAR | Status: DC | PRN

## 2020-06-04 MED ORDER — LIDOCAINE VISCOUS HCL 2 % MT SOLN
15.0000 mL | Freq: Once | OROMUCOSAL | Status: AC
  Administered 2020-06-04: 15 mL via ORAL
  Filled 2020-06-04: qty 15

## 2020-06-04 MED ORDER — LORAZEPAM 2 MG/ML IJ SOLN: 0.0000 mg | Freq: Four times a day (QID) | INTRAMUSCULAR | Status: DC

## 2020-06-04 MED ORDER — POTASSIUM CHLORIDE 10 MEQ/100ML IV SOLN
10.0000 meq | INTRAVENOUS | Status: AC
  Administered 2020-06-04 (×4): 10 meq via INTRAVENOUS
  Filled 2020-06-04 (×4): qty 100

## 2020-06-04 MED ORDER — ONDANSETRON HCL 4 MG/2ML IJ SOLN
4.0000 mg | Freq: Four times a day (QID) | INTRAMUSCULAR | Status: DC | PRN
  Administered 2020-06-04 – 2020-06-05 (×2): 4 mg via INTRAVENOUS
  Filled 2020-06-04 (×2): qty 2

## 2020-06-04 MED ORDER — FAMOTIDINE IN NACL 20-0.9 MG/50ML-% IV SOLN
20.0000 mg | Freq: Once | INTRAVENOUS | Status: AC
  Administered 2020-06-04: 20 mg via INTRAVENOUS
  Filled 2020-06-04: qty 50

## 2020-06-04 NOTE — ED Triage Notes (Signed)
Patient c/o generalized abd pain with nausea, vomiting, and diarrhea. Denies any fevers, urinary symptoms, or blood in stool. Per patient started yesterday. Denies taking any medication for pain or vomiting. Patient states hx of GERD.

## 2020-06-04 NOTE — H&P (Addendum)
History and Physical  [facility]  Bryan Fleming YPP:[id number] DOB: 03-Sep-1960 DOA: 06/04/2020  PCP: Rosita Fire, MD  Patient coming from: Home   I have personally briefly reviewed patient's old medical records in [place]  Chief Complaint: abdominal pain   HPI: Bryan Fleming is a 59 y.o. male with medical history significant for chronic alcohol abuse which has gotten worse over the pandemic where he is drinking large amounts of liquor and beer on a daily basis. He reports epigastric abdominal pain that started last night around 9pm.  It has been associated with a day of heavy drinking. He has had mild cases of pancreatitis in the past but this time it feels much worse.  He has had nausea and vomiting and wretching since the pain started.  He has had several episodes of watery diarrhea.  He continues to smoke heavily at least 1 pack per day.  He has not been able to take his meds for past several days due to feeling ill.  He feels that he does nothing during the pandemic except smoke all day and drink alcohol.  He denies chest pain and shortness of breath.    ED Course: Pt was found to have a markedly elevated lipase at 1888. His WBC was 10.9.  His K was 3.1.  His sars 2 coronavirus test was negative as well as negative influenza testing.  His Mg was 2.6.  His AST was 69 and ALT was 32.  Total bilirubin was 1.1.  Glucose was 203.  He is being admitted for acute pancreatitis.    Review of Systems: As per HPI otherwise 10 point review of systems negative.   Past Medical History:  Diagnosis Date  . COPD (chronic obstructive pulmonary disease) ([place]) 2007   EMPHYSEMA  . GERD (gastroesophageal reflux disease) 2010  . Hepatitis    Alcoholic   . Hypertension     Past Surgical History:  Procedure Laterality Date  . CARDIAC CATHETERIZATION     4-5 YEARS AGO  . COLONOSCOPY  03/14/2012   Procedure: COLONOSCOPY;  Surgeon: Rogene Houston, MD;  Location: [facility];  Service: Endoscopy;  Laterality: N/A;  100  . WRIST GANGLION EXCISION  1985     reports that he has been smoking cigarettes. He has a 37.00 pack-year smoking history. He has never used smokeless tobacco. He reports current alcohol use of about 22.0 standard drinks of alcohol per week. He reports that he does not use drugs.  Allergies  Allergen Reactions  . Other Nausea Only and Other (See Comments)    No spicy foods- Patient cannot tolerate- HEARTBURN    Family History  Problem Relation Age of Onset  . Hypertension Mother   . Asthma Father   . Diabetes Sister   . Hypertension Sister   . Hypertension Brother   . Hypertension Sister   . Lupus Daughter    Prior to Admission medications   Medication Sig Start Date End Date Taking? Authorizing Provider  acetaminophen (TYLENOL) 500 MG tablet Take 500-1,000 mg by mouth every 8 (eight) hours as needed for mild pain or headache.    [provider]  alum & mag hydroxide-simeth (MYLANTA) 200-200-20 MG/5ML suspension Take 30 mLs by mouth every 6 (six) hours as needed for indigestion or heartburn.    [provider]  aspirin 81 MG tablet Take 81 mg by mouth daily.    [provider]  bismuth subsalicylate (PEPTO BISMOL) 262 MG  chewable tablet Chew 524 mg by mouth 3 (three) times daily as needed for indigestion.     [provider]  chlordiazePOXIDE (LIBRIUM) 25 MG capsule 50mg  PO TID x 1D, then 25-50mg  PO BID X 1D, then 25-50mg  PO QD X 1D Patient not taking: Reported on 12/06/2019 04/28/19   Couture, Cortni S, PA-C  citalopram (CELEXA) 20 MG tablet Take 20 mg by mouth daily.    [provider]  diphenhydrAMINE (BENADRYL) 25 MG tablet Take 25 mg by mouth daily as needed for itching or allergies.     [provider]  famotidine (PEPCID) 20 MG tablet Take 1 tablet (20 mg total) by mouth 2 (two) times daily. Patient not taking: Reported on 12/06/2019 03/29/19   Francine Graven, DO    loperamide (IMODIUM A-D) 2 MG tablet Take 2 mg by mouth daily as needed for diarrhea or loose stools.    [provider]  metoprolol tartrate (LOPRESSOR) 100 MG tablet TAKE 1 Tablet  BY MOUTH TWICE DAILY Patient not taking: Reported on 12/06/2019 11/13/17   Soyla Dryer, PA-C  mometasone-formoterol (DULERA) 200-5 MCG/ACT AERO Inhale 2 puffs into the lungs 2 (two) times daily. 08/16/17   Soyla Dryer, PA-C  ondansetron (ZOFRAN ODT) 4 MG disintegrating tablet 4mg  ODT q4 hours prn nausea/vomit Patient not taking: Reported on 12/06/2019 04/22/18   Milton Ferguson, MD  ondansetron (ZOFRAN) 4 MG tablet Take 1 tablet (4 mg total) by mouth every 6 (six) hours. 12/06/19   Garald Balding, PA-C  oxymetazoline (AFRIN) 0.05 % nasal spray Place 2 sprays into both nostrils daily as needed for congestion.    [provider]  pantoprazole (PROTONIX) 20 MG tablet Take 1 tablet (20 mg total) by mouth daily for 14 days. Patient not taking: Reported on 12/06/2019 04/28/19 12/06/19  Couture, Cortni S, PA-C  PROVENTIL HFA 108 (90 Base) MCG/ACT inhaler INHALE 2 PUFFS BY MOUTH EVERY 6 HOURS AS NEEDED FOR COUGHING, WHEEZING, OR SHORTNESS OF BREATH Patient taking differently: Inhale 2 puffs into the lungs every 6 (six) hours as needed for wheezing or shortness of breath (or coughing).  11/13/17   Soyla Dryer, PA-C  sildenafil (VIAGRA) 100 MG tablet Take 0.5-1 tablets (50-100 mg total) by mouth daily as needed for erectile dysfunction. Patient not taking: Reported on 12/06/2019 12/28/17   Soyla Dryer, PA-C  sucralfate (CARAFATE) 1 g tablet Take 1 tablet (1 g total) by mouth 3 (three) times daily with meals for 14 days. Patient not taking: Reported on 12/06/2019 04/28/19 12/06/19  Couture, Cortni S, PA-C  tetrahydrozoline 0.05 % ophthalmic solution Place 1 drop into both eyes as needed (for dry eyes).     [provider]  traMADol (ULTRAM) 50 MG tablet Take 1 tablet (50 mg total) by mouth every 6  (six) hours as needed. Patient not taking: Reported on 12/06/2019 04/22/18   Milton Ferguson, MD  traZODone (DESYREL) 100 MG tablet Take 200 mg by mouth at bedtime as needed for sleep.     [provider]  vitamin B-12 (CYANOCOBALAMIN) 500 MCG tablet Take 500 mcg by mouth daily.    [provider]    Physical Exam: Vitals:   06/04/20 0914 06/04/20 0917  BP:  (S) (!) 143/101  Pulse:  (S) (!) 108  Resp:  18  Temp:  98.2 F (36.8 C)  TempSrc:  Oral  SpO2:  99%  Weight: 65.8 kg   Height: 5' 8.5" (1.74 m)    Constitutional: emaciated chronically ill appearing, appears  older than stated age, NAD, calm, comfortable Eyes: PERRL, lids and conjunctivae normal ENMT: Mucous membranes are moist. Posterior pharynx clear of any exudate or lesions.   Neck: normal, supple, no masses, no thyromegaly Respiratory: clear to auscultation bilaterally, no wheezing, no crackles. Normal respiratory effort. No accessory muscle use.  Cardiovascular: Regular rate and rhythm, no murmurs / rubs / gallops. No extremity edema. 2+ pedal pulses. No carotid bruits.  Abdomen: diffuse epigastric tenderness with light palpation and guarding, no masses palpated. No hepatosplenomegaly. Bowel sounds positive.  Musculoskeletal: no clubbing / cyanosis. No joint deformity upper and lower extremities. Good ROM, no contractures. Normal muscle tone.  Skin: no rashes, lesions, ulcers. No induration Neurologic: CN 2-12 grossly intact. Sensation intact, DTR normal. Strength 5/5 in all 4.  Psychiatric:  Alert and oriented x 3. Normal mood.   Labs on Admission: I have personally reviewed following labs and imaging studies  CBC: Recent Labs  Lab 06/04/20 0920  WBC 10.9*  HGB 16.3  HCT 48.2  MCV 92.7  PLT 124   Basic Metabolic Panel: Recent Labs  Lab 06/04/20 0920  NA 135  K 3.1*  CL 88*  CO2 30  GLUCOSE 203*  BUN 5*  CREATININE 0.73  CALCIUM 9.4   GFR: Estimated Creatinine Clearance: 94.8 mL/min  (by C-G formula based on SCr of 0.73 mg/dL). Liver Function Tests: Recent Labs  Lab 06/04/20 0920  AST 69*  ALT 32  ALKPHOS 88  BILITOT 1.1  PROT 8.6*  ALBUMIN 4.4   Recent Labs  Lab 06/04/20 0920  LIPASE 1,888*   No results for input(s): AMMONIA in the last 168 hours. Coagulation Profile: No results for input(s): INR, PROTIME in the last 168 hours. Cardiac Enzymes: No results for input(s): CKTOTAL, CKMB, CKMBINDEX, TROPONINI in the last 168 hours. BNP (last 3 results) No results for input(s): PROBNP in the last 8760 hours. HbA1C: No results for input(s): HGBA1C in the last 72 hours. CBG: No results for input(s): GLUCAP in the last 168 hours. Lipid Profile: No results for input(s): CHOL, HDL, LDLCALC, TRIG, CHOLHDL, LDLDIRECT in the last 72 hours. Thyroid Function Tests: No results for input(s): TSH, T4TOTAL, FREET4, T3FREE, THYROIDAB in the last 72 hours. Anemia Panel: No results for input(s): VITAMINB12, FOLATE, FERRITIN, TIBC, IRON, RETICCTPCT in the last 72 hours. Urine analysis:    Component Value Date/Time   COLORURINE YELLOW 06/04/2020 0920   APPEARANCEUR CLOUDY (A) 06/04/2020 0920   LABSPEC 1.015 06/04/2020 0920   PHURINE 9.0 (H) 06/04/2020 0920   GLUCOSEU 50 (A) 06/04/2020 0920   HGBUR NEGATIVE 06/04/2020 0920   BILIRUBINUR NEGATIVE 06/04/2020 0920   KETONESUR 20 (A) 06/04/2020 0920   PROTEINUR 100 (A) 06/04/2020 0920   UROBILINOGEN 0.2 10/28/2007 1435   NITRITE NEGATIVE 06/04/2020 0920   LEUKOCYTESUR NEGATIVE 06/04/2020 0920    Radiological Exams on Admission: CT ABDOMEN PELVIS W CONTRAST  Result Date: 06/04/2020 CLINICAL DATA:  Abdominal pain with nausea, vomiting and diarrhea. Concern for pancreatitis. EXAM: CT ABDOMEN AND PELVIS WITH CONTRAST TECHNIQUE: Multidetector CT imaging of the abdomen and pelvis was performed using the standard protocol following bolus administration of intravenous contrast. CONTRAST:  129mL OMNIPAQUE IOHEXOL 300 MG/ML  SOLN  COMPARISON:  CT abdomen pelvis dated 03/29/2019 FINDINGS: Lower chest: No acute abnormality. Hepatobiliary: No focal liver abnormality is seen. No gallstones or gallbladder wall thickening. The common bile duct is dilated, measuring up to 10 mm. Pancreas: There is edema and surrounding inflammatory changes involving the pancreatic body and head.  The pancreatic parenchyma enhances without evidence of necrosis. Hypoattenuation in the groove between the pancreatic head and the duodenum (series 2 images 28-30) is favored to reflect inflammatory changes, however an underlying mass such as an acute peripancreatic fluid collection or malignancy. The pancreatic duct is at the upper limits of normal but is not appear dilated. Spleen: Normal in size without focal abnormality. Adrenals/Urinary Tract: Adrenal glands are unremarkable. Kidneys are normal, without renal calculi, focal lesion, or hydronephrosis. Bladder is unremarkable. Stomach/Bowel: Stomach is within normal limits. Appendix appears normal. No evidence of bowel wall thickening, distention, or inflammatory changes. Vascular/Lymphatic: No significant vascular findings are present. No enlarged abdominal or pelvic lymph nodes. No evidence of portal vein thrombosis or splenic artery aneurysm. Reproductive: The prostate is enlarged, measuring 5.2 cm in transverse dimension. Other: No abdominal wall hernia or abnormality. No abdominopelvic ascites. Musculoskeletal: No acute or significant osseous findings. IMPRESSION: 1. Acute interstitial edematous pancreatitis. Hypoattenuation in the groove between the pancreatic head and the duodenum is favored to reflect inflammatory changes, however an underlying mass such as an acute peripancreatic fluid collection or underlying malignancy could have a similar appearance. Follow-up MR abdomen could be considered to document resolution of this finding after the patient's acute episode has resolved. 2. Dilated common bile duct.  Aortic Atherosclerosis (ICD10-I70.0). Electronically Signed   By: Zerita Boers M.D.   On: 06/04/2020 12:13   Assessment/Plan Principal Problem:   Acute pancreatitis Active Problems:   Hypertension   GERD (gastroesophageal reflux disease)   COPD (chronic obstructive pulmonary disease) (HCC)   Alcohol abuse   Nicotine dependence, cigarettes, with other nicotine-induced disorders   Leukocytosis   Hypokalemia   1. Acute pancreatitis - secondary to chronic alcohol abuse with recent binge drinking - Keep NPO for bowel rest, continue IV fluids, IV pain and nausea medication, follow lipase level.  Check fasting lipid panel.  Mild AST elevation consistent with chronic alcohol abuse.  Planning MRI abdomen when pancreatitis has improved clinically to rule out underlying mass lesion.    2. Chronic alcohol abuse - Pt reports that he does have alcohol withdrawal symptoms.  Starting librium 25 mg TID, CIWA protocol started.  Follow.  May need transfer to stepdown ICU and precedex if unable to control symptoms.  Check urine drug screen.  3. Leukocytosis - reactive to acute pancreatitis - no signs of infection found.  Follow WBC.  4. Hypokalemia - IV replacement ordered. Follow.  5. Essential hypertension - IV metoprolol ordered, resume home amlodipine 6/29.  6. GERD - IV protonix ordered for GI protection.  7. Nicotine dependence - He is smoking heavily.  Nicotine patch 21 mg ordered.  Counseled on tobacco cessation.    DVT prophylaxis: enoxaparin   Code Status: Full   Family Communication: none present   Disposition Plan: Home   Consults called: n/a   Admission status: INP    Shalane Florendo MD [facility] How to contact the [facility] Attending or Consulting provider [place] or covering provider during after hours [place], for this patient?  1. Check the care team in [facility] and look for a) attending/consulting [facility] provider listed and b) the [facility] team listed 2. Log into [email or web address] and use [facility]'s universal password to access. If you do not have the password, please contact the hospital operator. 3. Locate the [facility] provider you are looking for under [facility] and page to a number that you can be directly reached. 4. If you still have difficulty reaching  the provider, please page the [facility] (Director on Call) for the Hospitalists listed on amion for assistance.   If 7PM-7AM, please contact night-coverage [email or web address] Password [facility]  06/04/2020, 12:57 PM

## 2020-06-04 NOTE — TOC Initial Note (Addendum)
Transition of Care [facility]) - Initial/Assessment Note    Patient Details  Name: Bryan Fleming MRN: [id number] Date of Birth: [birth year]  [facility]) CM/SW Contact:    Iona Beard, [place] Phone Number: 06/04/2020, 3:15 PM  Clinical Narrative:                 Pt presents to the ED for Acute pancreatitis. TOC received consult for substance abuse education. CSW spoke with pt about interest in local resources. Pt states that he is not at this time interested in any resources. Pt is from home and states he plans to return home at d/c. TOC to follow.         Patient Goals and CMS Choice        Expected Discharge Plan and Services                                                Prior Living Arrangements/Services                       Activities of Daily Living      Permission Sought/Granted                  Emotional Assessment              Admission diagnosis:  Acute pancreatitis [K85.90] Patient Active Problem List   Diagnosis Date Noted  . Acute pancreatitis 06/04/2020  . Leukocytosis 06/04/2020  . Hypokalemia 06/04/2020  . Elevated liver function tests 02/02/2016  . 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 Alcohol abuse 08/30/2015  . Anxiety state 08/30/2015  . Nicotine dependence, cigarettes, with other nicotine-induced disorders 08/30/2015  . Hepatitis 08/31/2011  . Hypertension   . GERD (gastroesophageal reflux disease)   . COPD (chronic obstructive pulmonary disease) ([place])    PCP:  Rosita Fire, MD Pharmacy:   [facility] [place] - [address] [id number] [address] [place] [address] Phone: [phone] Fax: [phone]     Social Determinants of Health (SDOH) Interventions    Readmission Risk Interventions No flowsheet data found.

## 2020-06-04 NOTE — ED Provider Notes (Signed)
[facility] EMERGENCY DEPARTMENT Provider Note   CSN: [id number] Arrival date & time: 06/04/20  1914     History Chief Complaint  Patient presents with  . Abdominal Pain    Bryan Fleming is a 59 y.o. male.  Bryan Fleming is a 59 y.o. male with a history of hypertension, GERD, COPD, and alcohol abuse, who presents to the emergency department for evaluation of upper abdominal pain.  He reports pain started at 9:00 last night and has been a constant severe pain primarily in the epigastric region of his abdomen.  Denies any lower abdominal pain.  Reports he has had multiple episodes of nonbloody emesis and has had approximately 3 episodes of diarrhea.  Denies hematochezia or melena.  No lightheadedness or syncope.  He states that he felt like this was likely his GERD, he typically takes medicine for this but has missed medicine for the past 3 days.  He reports pain worsened this morning prompting his evaluation.  He has not been able to keep down any food or fluids today.  He does report frequent alcohol use, last drink at 8 PM last night prior to pain beginning.  Typically drinks 3-5 beers per day or about half a pint of alcohol.  He states that his drinking has significantly increased during the pandemic, he has been seen previously for similar symptoms.        Past Medical History:  Diagnosis Date  . COPD (chronic obstructive pulmonary disease) ([place]) 2007   EMPHYSEMA  . GERD (gastroesophageal reflux disease) 2010  . Hepatitis    Alcoholic   . Hypertension     Patient Active Problem List   Diagnosis Date Noted  . Acute pancreatitis 06/04/2020  . Leukocytosis 06/04/2020  . Hypokalemia 06/04/2020  . Elevated liver function tests 02/02/2016  . 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 Alcohol abuse 08/30/2015  . Anxiety state 08/30/2015  . Nicotine dependence, cigarettes, with other nicotine-induced disorders 08/30/2015  . Hepatitis 08/31/2011  . Hypertension   . GERD  (gastroesophageal reflux disease)   . COPD (chronic obstructive pulmonary disease) ([place])     Past Surgical History:  Procedure Laterality Date  . CARDIAC CATHETERIZATION     4-5 YEARS AGO  . COLONOSCOPY  03/14/2012   Procedure: COLONOSCOPY;  Surgeon: Rogene Houston, MD;  Location: [facility];  Service: Endoscopy;  Laterality: N/A;  100  . WRIST GANGLION EXCISION  1985       Family History  Problem Relation Age of Onset  . Hypertension Mother   . Asthma Father   . Diabetes Sister   . Hypertension Sister   . Hypertension Brother   . Hypertension Sister   . Lupus Daughter     Social History   Tobacco Use  . Smoking status: Current Every Day Smoker    Packs/day: 1.00    Years: 37.00    Pack years: 37.00    Types: Cigarettes  . Smokeless tobacco: Never Used  Vaping Use  . Vaping Use: Never used  Substance Use Topics  . Alcohol use: Yes    Alcohol/week: 22.0 standard drinks    Types: 21 Cans of beer, 1 Standard drinks or equivalent per week    Comment: daily  . Drug use: No    Home Medications Prior to Admission medications   Medication Sig Start Date End Date Taking? Authorizing Provider  acetaminophen (TYLENOL) 500 MG tablet Take 500-1,000 mg by mouth every 8 (eight) hours as needed for mild pain  or headache.   Yes [provider]  alum & mag hydroxide-simeth (MYLANTA) 200-200-20 MG/5ML suspension Take 30 mLs by mouth every 6 (six) hours as needed for indigestion or heartburn.   Yes [provider]  amLODipine (NORVASC) 5 MG tablet Take 5 mg by mouth daily. 04/16/20  Yes [provider]  aspirin 81 MG tablet Take 81 mg by mouth daily.   Yes [provider]  bismuth subsalicylate (PEPTO BISMOL) 262 MG chewable tablet Chew 524 mg by mouth 3 (three) times daily as needed for indigestion.    Yes [provider]  diphenhydrAMINE (BENADRYL) 25 MG tablet Take 25 mg by mouth daily as needed for itching or allergies.    Yes  [provider]  loperamide (IMODIUM A-D) 2 MG tablet Take 2 mg by mouth daily as needed for diarrhea or loose stools.   Yes [provider]  omeprazole (PRILOSEC) 40 MG capsule Take 40 mg by mouth daily. 04/07/20  Yes [provider]  oxymetazoline (AFRIN) 0.05 % nasal spray Place 2 sprays into both nostrils daily as needed for congestion.   Yes [provider]  PROVENTIL HFA 108 (90 Base) MCG/ACT inhaler INHALE 2 PUFFS BY MOUTH EVERY 6 HOURS AS NEEDED FOR COUGHING, WHEEZING, OR SHORTNESS OF BREATH Patient taking differently: Inhale 2 puffs into the lungs every 6 (six) hours as needed for wheezing or shortness of breath (or coughing).  11/13/17  Yes Soyla Dryer, PA-C  SYMBICORT 160-4.5 MCG/ACT inhaler Inhale 2 puffs into the lungs in the morning and at bedtime. 04/29/20  Yes [provider]  tetrahydrozoline 0.05 % ophthalmic solution Place 1 drop into both eyes as needed (for dry eyes).    Yes [provider]  vitamin B-12 (CYANOCOBALAMIN) 500 MCG tablet Take 500 mcg by mouth daily.   Yes [provider]  chlordiazePOXIDE (LIBRIUM) 25 MG capsule 50mg  PO TID x 1D, then 25-50mg  PO BID X 1D, then 25-50mg  PO QD X 1D Patient not taking: Reported on 12/06/2019 04/28/19   Couture, Cortni S, PA-C  famotidine (PEPCID) 20 MG tablet Take 1 tablet (20 mg total) by mouth 2 (two) times daily. Patient not taking: Reported on 12/06/2019 03/29/19   Francine Graven, DO  ondansetron (ZOFRAN ODT) 4 MG disintegrating tablet 4mg  ODT q4 hours prn nausea/vomit Patient not taking: Reported on 12/06/2019 04/22/18   Milton Ferguson, MD  ondansetron (ZOFRAN) 4 MG tablet Take 1 tablet (4 mg total) by mouth every 6 (six) hours. Patient not taking: Reported on 06/04/2020 12/06/19   Garald Balding, PA-C  pantoprazole (PROTONIX) 20 MG tablet Take 1 tablet (20 mg total) by mouth daily for 14 days. Patient not taking: Reported on 12/06/2019 04/28/19 12/06/19  Couture, Cortni  S, PA-C  sildenafil (VIAGRA) 100 MG tablet Take 0.5-1 tablets (50-100 mg total) by mouth daily as needed for erectile dysfunction. Patient not taking: Reported on 12/06/2019 12/28/17   Soyla Dryer, PA-C  sucralfate (CARAFATE) 1 g tablet Take 1 tablet (1 g total) by mouth 3 (three) times daily with meals for 14 days. Patient not taking: Reported on 12/06/2019 04/28/19 12/06/19  Couture, Cortni S, PA-C  traMADol (ULTRAM) 50 MG tablet Take 1 tablet (50 mg total) by mouth every 6 (six) hours as needed. Patient not taking: Reported on 12/06/2019 04/22/18   Milton Ferguson, MD    Allergies    Other  Review of Systems   Review of Systems  Constitutional: Negative for chills and fever.  HENT: Negative.  Respiratory: Negative for cough and shortness of breath.   Cardiovascular: Negative for chest pain.  Gastrointestinal: Positive for abdominal pain, diarrhea, nausea and vomiting. Negative for blood in stool.  Genitourinary: Negative for dysuria, frequency and hematuria.  Musculoskeletal: Negative for arthralgias and myalgias.  Skin: Negative for color change and rash.  Neurological: Negative for dizziness, syncope and light-headedness.  All other systems reviewed and are negative.   Physical Exam Updated Vital Signs BP (S) (!) 143/101 (BP Location: Right Arm)   Pulse (S) (!) 108   Temp 98.2 F (36.8 C) (Oral)   Resp 18   Ht 5' 8.5" (1.74 m)   Wt 65.8 kg   SpO2 99%   BMI 21.73 kg/m   Physical Exam Vitals and nursing note reviewed.  Constitutional:      General: He is not in acute distress.    Appearance: He is well-developed. He is ill-appearing. He is not diaphoretic.     Comments: Patient is somewhat ill-appearing and appears uncomfortable but is in no acute distress  HENT:     Head: Normocephalic and atraumatic.     Mouth/Throat:     Mouth: Mucous membranes are moist.     Pharynx: Oropharynx is clear.  Eyes:     General:        Right eye: No discharge.        Left eye: No  discharge.     Pupils: Pupils are equal, round, and reactive to light.  Cardiovascular:     Rate and Rhythm: Regular rhythm. Tachycardia present.     Heart sounds: Normal heart sounds. No murmur heard.  No friction rub. No gallop.      Comments: Mild tachycardia with regular rhythm Pulmonary:     Effort: Pulmonary effort is normal. No respiratory distress.     Breath sounds: Normal breath sounds. No wheezing or rales.     Comments: Respirations equal and unlabored, patient able to speak in full sentences, lungs clear to auscultation bilaterally Abdominal:     General: Bowel sounds are normal. There is no distension.     Palpations: Abdomen is soft. There is no mass.     Tenderness: There is abdominal tenderness in the epigastric area and left upper quadrant. There is no guarding.     Comments: Abdomen is soft, nondistended, bowel sounds present throughout, there is tenderness primarily in the epigastric region and left upper quadrant, no focal right upper quadrant tenderness, negative Murphy sign, no lower abdominal tenderness.  No peritoneal signs.  Musculoskeletal:        General: No deformity.     Cervical back: Neck supple.  Skin:    General: Skin is warm and dry.     Capillary Refill: Capillary refill takes less than 2 seconds.  Neurological:     Mental Status: He is alert and oriented to person, place, and time.     Coordination: Coordination normal.     Comments: Speech is clear, able to follow commands Moves extremities without ataxia, coordination intact  Psychiatric:        Mood and Affect: Mood normal.        Behavior: Behavior normal.     ED Results / Procedures / Treatments   Labs (all labs ordered are listed, but only abnormal results are displayed) Labs Reviewed  LIPASE, BLOOD - Abnormal; Notable for the following components:      Result Value   Lipase 1,888 (*)    All other components within normal limits  COMPREHENSIVE METABOLIC PANEL - Abnormal; Notable for  the following components:   Potassium 3.1 (*)    Chloride 88 (*)    Glucose, Bld 203 (*)    BUN 5 (*)    Total Protein 8.6 (*)    AST 69 (*)    Anion gap 17 (*)    All other components within normal limits  CBC - Abnormal; Notable for the following components:   WBC 10.9 (*)    All other components within normal limits  URINALYSIS, ROUTINE W REFLEX MICROSCOPIC - Abnormal; Notable for the following components:   APPearance CLOUDY (*)    pH 9.0 (*)    Glucose, UA 50 (*)    Ketones, ur 20 (*)    Protein, ur 100 (*)    All other components within normal limits  RESPIRATORY PANEL BY RT PCR (FLU A&B, COVID)  HIV ANTIBODY (ROUTINE TESTING W REFLEX)  MAGNESIUM    EKG EKG Interpretation  Date/Time:  Thursday June 04 2020 09:57:58 EDT Ventricular Rate:  101 PR Interval:    QRS Duration: 77 QT Interval:  391 QTC Calculation: 507 R Axis:   74 Text Interpretation: Sinus tachycardia Borderline T abnormalities, anterior leads Prolonged QT interval New since previous tracing QT prolonged Confirmed by Fredia Sorrow [phone]) on 06/04/2020 10:29:09 AM   Radiology CT ABDOMEN PELVIS W CONTRAST  Result Date: 06/04/2020 CLINICAL DATA:  Abdominal pain with nausea, vomiting and diarrhea. Concern for pancreatitis. EXAM: CT ABDOMEN AND PELVIS WITH CONTRAST TECHNIQUE: Multidetector CT imaging of the abdomen and pelvis was performed using the standard protocol following bolus administration of intravenous contrast. CONTRAST:  15mL OMNIPAQUE IOHEXOL 300 MG/ML  SOLN COMPARISON:  CT abdomen pelvis dated 03/29/2019 FINDINGS: Lower chest: No acute abnormality. Hepatobiliary: No focal liver abnormality is seen. No gallstones or gallbladder wall thickening. The common bile duct is dilated, measuring up to 10 mm. Pancreas: There is edema and surrounding inflammatory changes involving the pancreatic body and head. The pancreatic parenchyma enhances without evidence of necrosis. Hypoattenuation in the groove  between the pancreatic head and the duodenum (series 2 images 28-30) is favored to reflect inflammatory changes, however an underlying mass such as an acute peripancreatic fluid collection or malignancy. The pancreatic duct is at the upper limits of normal but is not appear dilated. Spleen: Normal in size without focal abnormality. Adrenals/Urinary Tract: Adrenal glands are unremarkable. Kidneys are normal, without renal calculi, focal lesion, or hydronephrosis. Bladder is unremarkable. Stomach/Bowel: Stomach is within normal limits. Appendix appears normal. No evidence of bowel wall thickening, distention, or inflammatory changes. Vascular/Lymphatic: No significant vascular findings are present. No enlarged abdominal or pelvic lymph nodes. No evidence of portal vein thrombosis or splenic artery aneurysm. Reproductive: The prostate is enlarged, measuring 5.2 cm in transverse dimension. Other: No abdominal wall hernia or abnormality. No abdominopelvic ascites. Musculoskeletal: No acute or significant osseous findings. IMPRESSION: 1. Acute interstitial edematous pancreatitis. Hypoattenuation in the groove between the pancreatic head and the duodenum is favored to reflect inflammatory changes, however an underlying mass such as an acute peripancreatic fluid collection or underlying malignancy could have a similar appearance. Follow-up MR abdomen could be considered to document resolution of this finding after the patient's acute episode has resolved. 2. Dilated common bile duct. Aortic Atherosclerosis (ICD10-I70.0). Electronically Signed   By: Zerita Boers M.D.   On: 06/04/2020 12:13    Procedures Procedures (including critical care time)  Medications Ordered in ED Medications  LORazepam (ATIVAN) tablet 1-4 mg (has no administration  in time range)    Or  LORazepam (ATIVAN) injection 1-4 mg (has no administration in time range)  thiamine tablet 100 mg (100 mg Oral Given 06/04/20 1305)    Or  thiamine (B-1)  injection 100 mg ( Intravenous See Alternative 86/76/19 5093)  folic acid (FOLVITE) tablet 1 mg (has no administration in time range)  multivitamin with minerals tablet 1 tablet (1 tablet Oral Given 06/04/20 1304)  enoxaparin (LOVENOX) injection 40 mg (40 mg Subcutaneous Given 06/04/20 1304)  LORazepam (ATIVAN) injection 0-4 mg (has no administration in time range)    Followed by  LORazepam (ATIVAN) injection 0-4 mg (has no administration in time range)  pantoprazole (PROTONIX) injection 40 mg (has no administration in time range)  0.9 % NaCl with KCl 20 mEq/ L  infusion (has no administration in time range)  potassium chloride 10 mEq in 100 mL IVPB (has no administration in time range)  HYDROmorphone (DILAUDID) injection 0.25-0.5 mg (has no administration in time range)  ondansetron (ZOFRAN) tablet 4 mg (has no administration in time range)    Or  ondansetron (ZOFRAN) injection 4 mg (has no administration in time range)  prochlorperazine (COMPAZINE) injection 10 mg (has no administration in time range)  metoprolol tartrate (LOPRESSOR) injection 5 mg (5 mg Intravenous Given 06/04/20 1304)  sodium chloride 0.9 % bolus 1,000 mL (0 mLs Intravenous Stopped 06/04/20 1121)  ondansetron (ZOFRAN) injection 4 mg (4 mg Intravenous Given 06/04/20 1002)  famotidine (PEPCID) IVPB 20 mg premix (0 mg Intravenous Stopped 06/04/20 1120)  alum & mag hydroxide-simeth (MAALOX/MYLANTA) 200-200-20 MG/5ML suspension 30 mL (30 mLs Oral Given 06/04/20 1003)    And  lidocaine (XYLOCAINE) 2 % viscous mouth solution 15 mL (15 mLs Oral Given 06/04/20 1002)  potassium chloride SA (KLOR-CON) CR tablet 40 mEq (40 mEq Oral Given 06/04/20 1001)  morphine 4 MG/ML injection 4 mg (4 mg Intravenous Given 06/04/20 1144)  iohexol (OMNIPAQUE) 300 MG/ML solution 100 mL (100 mLs Intravenous Contrast Given 06/04/20 1124)    ED Course  I have reviewed the triage vital signs and the nursing notes.  Pertinent labs & imaging results  that were available during my care of the patient were reviewed by me and considered in my medical decision making (see chart for details).    MDM Rules/Calculators/A&P                          Patient presents to the ED with complaints of abdominal pain. Patient nontoxic appearing, in no apparent distress, patient is hypertensive and mildly tachycardic but vitals otherwise normal, afebrile. On exam patient tender to palpation primarily in the epigastric region and left upper quadrant, no focal right upper quadrant tenderness, negative Murphy sign, no lower abdominal tenderness, no peritoneal signs. Will evaluate with labs, will hold off on imaging at this time until lab work has been completed.  IV fluids as well as Zofran, Pepcid and GI cocktail ordered for symptom management  I have independently ordered, reviewed and interpreted all labs and imaging:  CBC: Minimal leukocytosis, normal hemoglobin CMP: Mild hypokalemia with potassium of 3.1, glucose of 203, no other significant electrolyte derangements, normal renal function, minimally elevated AST but LFTs otherwise unremarkable Lipase: Significantly elevated at 1888 UA: Some ketones and protein noted likely suggesting dehydration, but no hematuria or signs of infection  Patient continues to complain of upper abdominal pain, discussed lab work concerning for acute pancreatitis, will get CT abdomen pelvis to assess for  any associated gallbladder disease or pancreatic masses or signs of infection.  IV morphine given for pain management.  CT with acute interstitial edematous pancreatitis, there is some hypoattenuation concerning for possible underlying mass within the pancreatic head, recommend follow-up with MR after pancreatitis has resolved to rule out underlying malignancy.  There is also some dilatation of the common bile duct but with no evidence of gallstones or cholecystitis.  Suspect that this pancreatitis is likely due to alcohol use.   Patient will require admission for fluids, bowel rest and pain management.  Medicine consult placed.  Will get Covid screening swab.  Case discussed with Dr. Wynetta Emery with [facility] hospital[facility] who will see and admit the patient.  Final Clinical Impression(s) / ED Diagnoses Final diagnoses:  Acute pancreatitis, unspecified complication status, unspecified pancreatitis type    Rx / DC Orders ED Discharge Orders    None       Janet Berlin 06/04/20 1335    Fredia Sorrow, MD 06/05/20 2209

## 2020-06-05 ENCOUNTER — Inpatient Hospital Stay (HOSPITAL_COMMUNITY): Payer: Medicare HMO

## 2020-06-05 LAB — COMPREHENSIVE METABOLIC PANEL
ALT: 20 U/L (ref 0–44)
AST: 28 U/L (ref 15–41)
Albumin: 3.4 g/dL — ABNORMAL LOW (ref 3.5–5.0)
Alkaline Phosphatase: 63 U/L (ref 38–126)
Anion gap: 8 (ref 5–15)
BUN: 5 mg/dL — ABNORMAL LOW (ref 6–20)
CO2: 26 mmol/L (ref 22–32)
Calcium: 8.8 mg/dL — ABNORMAL LOW (ref 8.9–10.3)
Chloride: 99 mmol/L (ref 98–111)
Creatinine, Ser: 0.61 mg/dL (ref 0.61–1.24)
GFR, Estimated: >60 mL/min (ref >60)
Glucose, Bld: 106 mg/dL — ABNORMAL HIGH (ref 70–99)
Potassium: 4.4 mmol/L (ref 3.5–5.1)
Sodium: 133 mmol/L — ABNORMAL LOW (ref 135–145)
Total Bilirubin: 1.1 mg/dL (ref 0.3–1.2)
Total Protein: 6.7 g/dL (ref 6.5–8.1)

## 2020-06-05 LAB — LIPID PANEL
Cholesterol: 165 mg/dL (ref 0–200)
HDL: 59 mg/dL (ref >40)
LDL Cholesterol: 84 mg/dL (ref 0–99)
Total CHOL/HDL Ratio: 2.8 RATIO
Triglycerides: 111 mg/dL (ref <150)
VLDL: 22 mg/dL (ref 0–40)

## 2020-06-05 LAB — CBC WITH DIFFERENTIAL/PLATELET
Abs Immature Granulocytes: 0.02 10*3/uL (ref 0.00–0.07)
Basophils Absolute: 0 10*3/uL (ref 0.0–0.1)
Basophils Relative: 0 %
Eosinophils Absolute: 0 10*3/uL (ref 0.0–0.5)
Eosinophils Relative: 0 %
HCT: 42.2 % (ref 39.0–52.0)
Hemoglobin: 13.6 g/dL (ref 13.0–17.0)
Immature Granulocytes: 0 %
Lymphocytes Relative: 19 %
Lymphs Abs: 1.4 10*3/uL (ref 0.7–4.0)
MCH: 31.1 pg (ref 26.0–34.0)
MCHC: 32.2 g/dL (ref 30.0–36.0)
MCV: 96.6 fL (ref 80.0–100.0)
Monocytes Absolute: 0.7 10*3/uL (ref 0.1–1.0)
Monocytes Relative: 10 %
Neutro Abs: 5.2 10*3/uL (ref 1.7–7.7)
Neutrophils Relative %: 71 %
Platelets: 171 10*3/uL (ref 150–400)
RBC: 4.37 MIL/uL (ref 4.22–5.81)
RDW: 15.1 % (ref 11.5–15.5)
WBC: 7.4 10*3/uL (ref 4.0–10.5)
nRBC: 0 % (ref 0.0–0.2)

## 2020-06-05 LAB — PHOSPHORUS: Phosphorus: 2.3 mg/dL — ABNORMAL LOW (ref 2.5–4.6)

## 2020-06-05 LAB — HEMOGLOBIN A1C
Hgb A1c MFr Bld: 5.5 % (ref 4.8–5.6)
Mean Plasma Glucose: 111 mg/dL

## 2020-06-05 LAB — LIPASE, BLOOD: Lipase: 120 U/L — ABNORMAL HIGH (ref 11–51)

## 2020-06-05 LAB — MAGNESIUM: Magnesium: 2.6 mg/dL — ABNORMAL HIGH (ref 1.7–2.4)

## 2020-06-05 LAB — PROTIME-INR
INR: 1.1 (ref 0.8–1.2)
Prothrombin Time: 13.3 seconds (ref 11.4–15.2)

## 2020-06-05 IMAGING — MR MR ABDOMEN WO/W CM
20 of 22 series · 44 of 48 positions shown · IV contrast (gadavist)
Comparison: [DATE] CT abdomen/pelvis.

CLINICAL DATA: Inpatient. Acute pancreatitis with indeterminate
small low-attenuation focus along the pancreaticoduodenal groove on
recent CT.

EXAM:
MRI ABDOMEN WITHOUT AND WITH CONTRAST
TECHNIQUE: Multiplanar multisequence MR imaging of the abdomen was performed
both before and after the administration of intravenous contrast.
CONTRAST:  7mL GADAVIST GADOBUTROL 1 MMOL/ML IV SOLN

[Series 4: ax haste · axial · 6.0mm · 1.19mm/px · 1 of 30 slices shown]
[im 1/30]
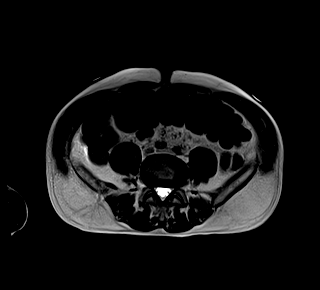

[Series 7: T2 fat-sat · axial · 6.0mm · 1.19mm/px · 1 of 30 slices shown]
[im 1/30]
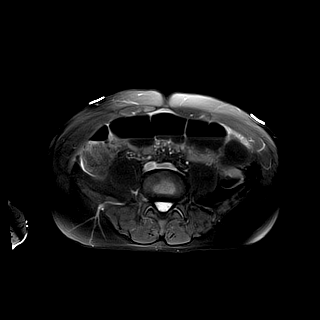

[Series 8: cor haste · coronal · 6.0mm · 1.25mm/px · 1 of 26 slices shown]
[im 1/26]
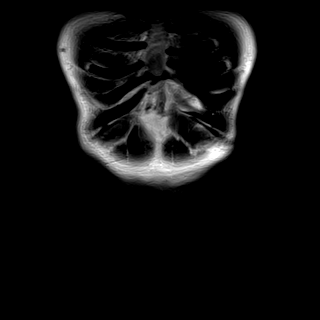

[Series 9: DWI · axial · 6.0mm · 1.42mm/px · 1 of 30 slices shown (1 of 4)]
[im 1/30]
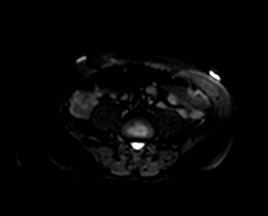

[Series 9: DWI · axial · 6.0mm · 1.42mm/px · 1 of 30 slices shown (2 of 4)]
[im 1/30]
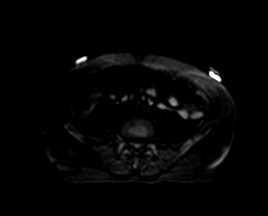

[Series 9: DWI · axial · 6.0mm · 1.42mm/px · 1 of 30 slices shown (3 of 4)]
[im 1/30]
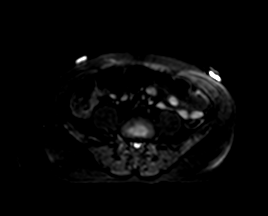

[Series 10: DWI · axial · 6.0mm · 1.42mm/px · 1 of 30 slices shown (4 of 4)]
[im 1/30]
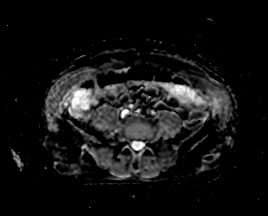

[Series 15: ax in and · axial · 3.0mm · 1.19mm/px · z∈[-135,+78]mm · 3 of 72 slices shown (1 of 2)]
[im 1/72]
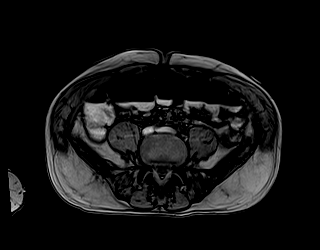
[im 36/72]
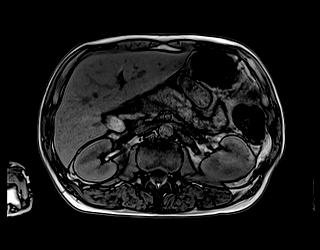
[im 72/72]
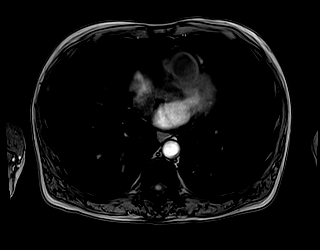

[Series 16: ax in and · axial · 3.0mm · 1.19mm/px · z∈[-135,+78]mm · 3 of 72 slices shown (2 of 2)]
[im 1/72]
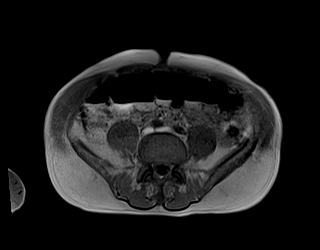
[im 36/72]
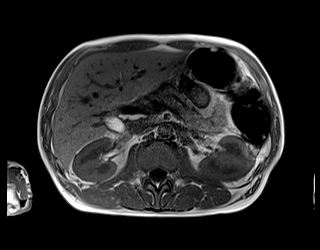
[im 72/72]
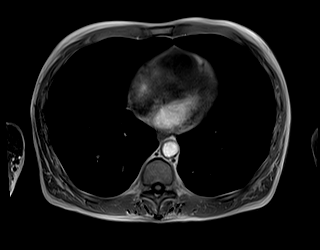

[Series 17: MRCP · coronal · 4.0mm · 1.12mm/px · 1 of 19 slices shown]
[im 1/19]
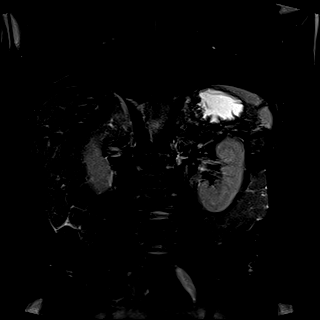

[Series 18: T1 dynamic · axial · non-contrast · 3.0mm · 1.19mm/px · z∈[-135,+78]mm · 3 of 72 slices shown (1 of 4)]
[im 1/72]
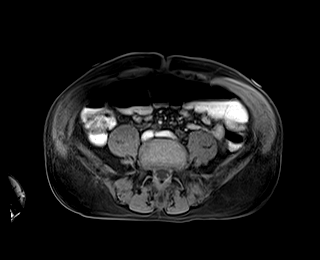
[im 36/72]
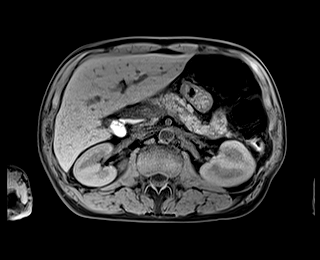
[im 72/72]
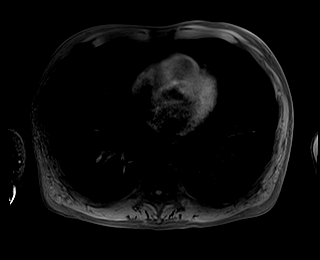

[Series 20: T1 dynamic post-contrast · axial · 3.0mm · 1.19mm/px · z∈[-135,+78]mm · 3 of 72 slices shown (1 of 6)]
[im 1/72]
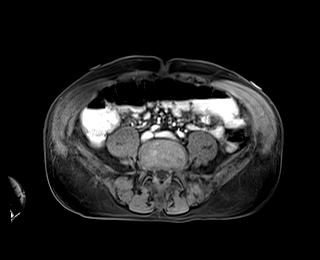
[im 36/72]
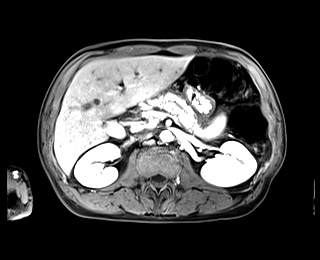
[im 72/72]
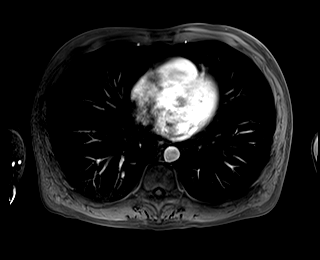

[Series 21: T1 dynamic · axial · 3.0mm · 1.19mm/px · z∈[-135,+78]mm · 3 of 72 slices shown (2 of 4)]
[im 1/72]
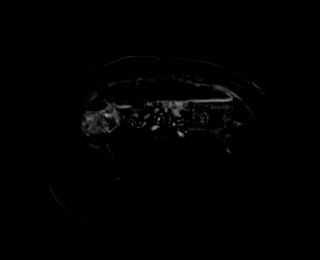
[im 36/72]
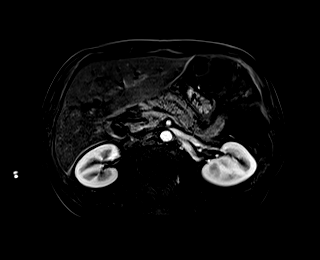
[im 72/72]
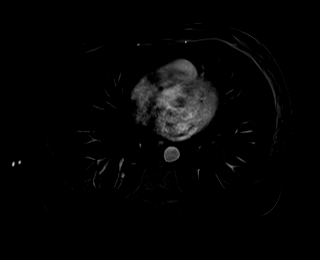

[Series 22: T1 dynamic post-contrast · axial · 3.0mm · 1.19mm/px · z∈[-135,+78]mm · 3 of 72 slices shown (2 of 6)]
[im 1/72]
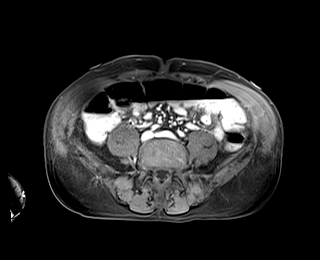
[im 36/72]
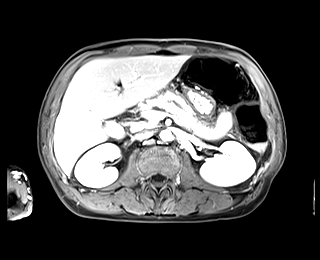
[im 72/72]
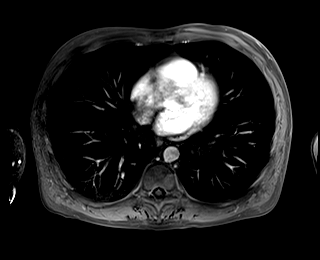

[Series 23: T1 dynamic · axial · 3.0mm · 1.19mm/px · z∈[-135,+78]mm · 3 of 72 slices shown (3 of 4)]
[im 1/72]
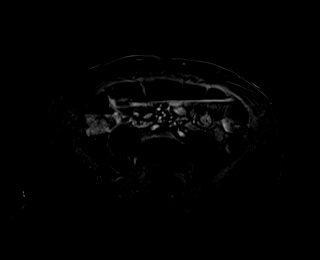
[im 36/72]
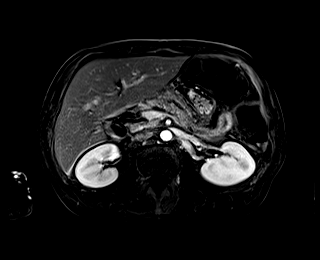
[im 72/72]
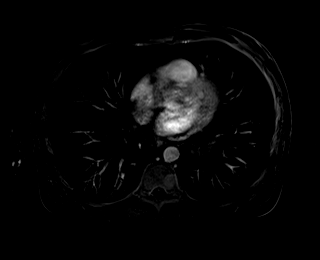

[Series 24: T1 dynamic post-contrast · axial · 3.0mm · 1.19mm/px · z∈[-135,+78]mm · 3 of 72 slices shown (3 of 6)]
[im 1/72]
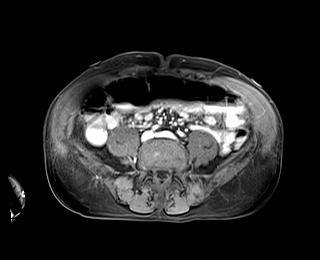
[im 36/72]
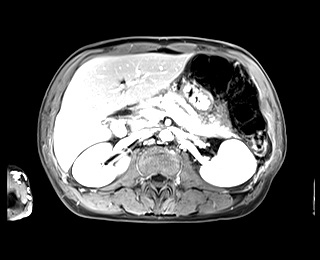
[im 72/72]
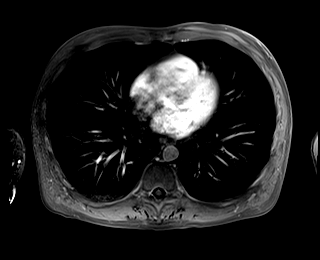

[Series 25: T1 dynamic · axial · 3.0mm · 1.19mm/px · z∈[-135,+78]mm · 3 of 72 slices shown (4 of 4)]
[im 1/72]
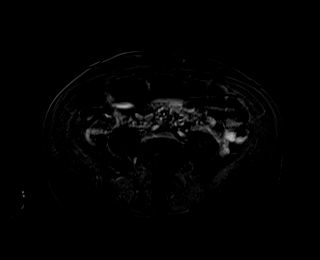
[im 36/72]
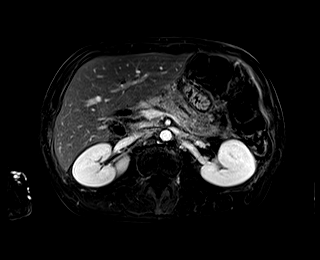
[im 72/72]
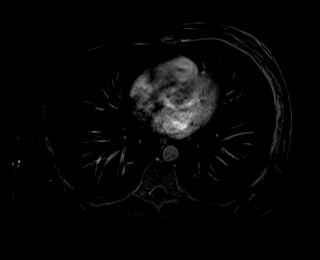

[Series 26: T1 dynamic post-contrast · coronal · 3.0mm · 1.25mm/px · 3 of 72 slices shown (4 of 6)]
[im 1/72]
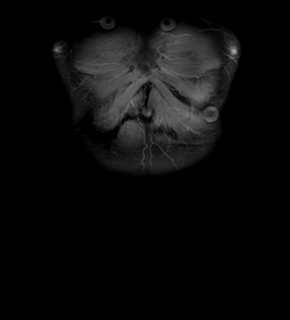
[im 36/72]
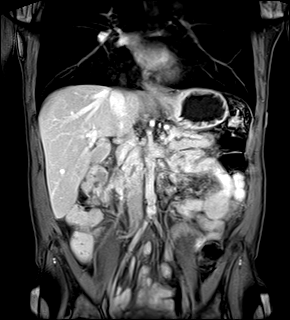
[im 72/72]
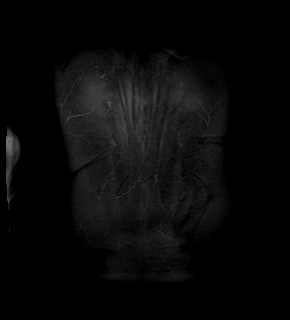

[Series 27: T1 dynamic post-contrast · axial · 3.0mm · 1.19mm/px · z∈[-135,+78]mm · 3 of 72 slices shown (5 of 6)]
[im 1/72]
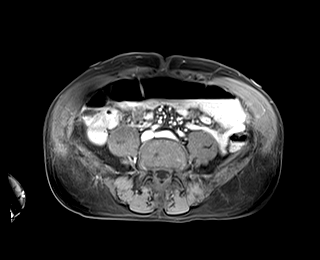
[im 36/72]
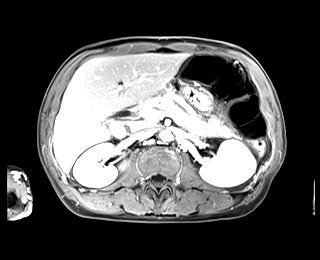
[im 72/72]
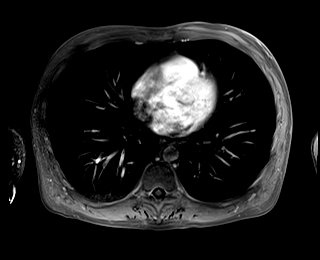

[Series 28: T1 dynamic post-contrast · axial · 3.0mm · 1.19mm/px · z∈[-135,+78]mm · 3 of 72 slices shown (6 of 6)]
[im 1/72]
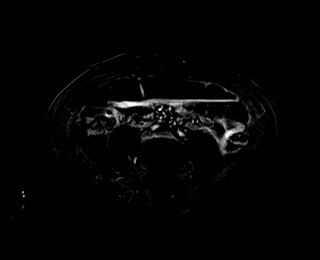
[im 36/72]
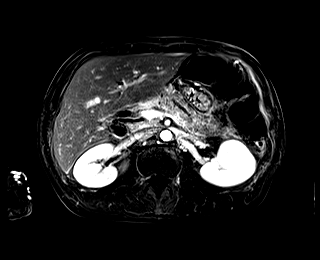
[im 72/72]
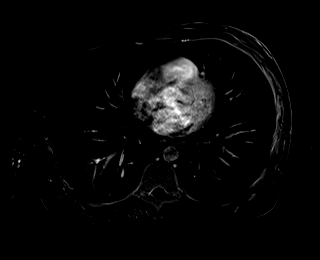

[44 of 48 positions shown; findings below may reference images not displayed]

FINDINGS: Lower chest: Mild bandlike scarring versus atelectasis at the
dependent right lung base.

Hepatobiliary: Normal liver size and configuration. Mild diffuse
hepatic steatosis. Subcentimeter simple lateral segment left liver
cyst. No suspicious liver masses. Nondistended gallbladder with no
gallstones. No gallbladder wall thickening. Layering gallbladder
sludge. No pericholecystic fluid. No intrahepatic biliary ductal
dilatation. Dilated common bile duct with diameter 10 mm with sharp
distal CBD tapering at the level of the pancreatic head. No evidence
of choledocholithiasis. No discrete biliary or ampullary mass. No
beading of the bile ducts.

Pancreas: Diffuse peripancreatic and peripancreatic edema, most
prominent in the body of the pancreas, compatible with acute
pancreatitis. Diffuse main pancreatic duct dilation up to 5 mm
diameter with irregular beaded appearance. No pancreas divisum.
There is an indistinct 1.7 x 1.1 cm mildly T2 hyperintense focus
along the pancreaticoduodenal groove (series 7/image 21) with
surrounding edema with enhancement similar to the pancreatic
parenchyma, favoring groove pancreatitis. Otherwise no discrete
pancreatic mass. No measurable pancreatic or peripancreatic fluid
collections. No evidence of pancreatic parenchymal hemorrhage and no
evidence of nonenhancing pancreatic parenchyma to suggest
necrotizing pancreatitis.

Spleen: Normal size. No mass.

Adrenals/Urinary Tract: Normal adrenals. No hydronephrosis. Normal
kidneys with no renal mass.

Stomach/Bowel: Normal non-distended stomach. Visualized small bowel
is normal caliber, with no bowel wall thickening. Mild gas and
layering fluid throughout the mildly distended visualized colon.

Vascular/Lymphatic: Atherosclerotic nonaneurysmal abdominal aorta.
Patent portal, splenic, hepatic and renal veins. Mildly enlarged
cm peripancreatic lymph node (series 4/image 18).

Other: No abdominal ascites or focal fluid collection.

Musculoskeletal: No aggressive appearing focal osseous lesions.
IMPRESSION: 1. Acute non-necrotizing pancreatitis. Ill-defined small mildly T2
hyperintense focus along the pancreaticoduodenal groove with
surrounding edema and enhancement similar to the pancreatic
parenchyma, favoring groove pancreatitis. Short-term follow-up MRI
abdomen without and with IV contrast recommended in 1-3 months to
document resolution.
2. Diffuse main pancreatic duct dilation up to 5 mm diameter with
irregular beaded appearance, which may indicate underlying chronic
pancreatitis.
3. Dilated common bile duct (10 mm diameter) with sharp distal CBD
tapering at the level of the pancreatic head. No evidence of
choledocholithiasis or discrete biliary or ampullary mass. Finding
is nonspecific and may be due to mass effect from surrounding
inflammation related to acute pancreatitis. This finding can also be
reassessed on short-term follow-up MRI. ERCP could be obtained at
this time as clinically warranted.
4. No measurable fluid collections.
5. Mild peripancreatic lymphadenopathy, nonspecific, favor reactive.
6. Mild diffuse hepatic steatosis.
7. Gallbladder sludge. No gallstones.

## 2020-06-05 IMAGING — MR MR 3D RECON AT [facility]
20 of 23 series · 43 of 48 positions shown · IV contrast (gadavist)
Comparison: [DATE] CT abdomen/pelvis.

CLINICAL DATA: Inpatient. Acute pancreatitis with indeterminate
small low-attenuation focus along the pancreaticoduodenal groove on
recent CT.

EXAM:
MRI ABDOMEN WITHOUT AND WITH CONTRAST
TECHNIQUE: Multiplanar multisequence MR imaging of the abdomen was performed
both before and after the administration of intravenous contrast.
CONTRAST:  7mL GADAVIST GADOBUTROL 1 MMOL/ML IV SOLN

[Series 4: ax haste · axial · 6.0mm · 1.19mm/px · 1 of 30 slices shown]
[im 1/30]
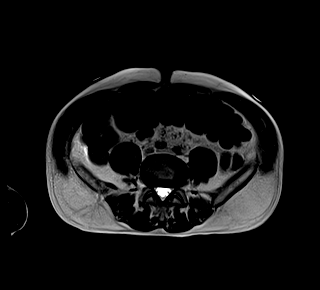

[Series 6: T2 fat-sat · axial · 6.0mm · 1.19mm/px · 1 of 30 slices shown]
[im 1/30]
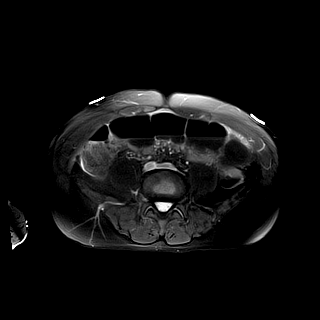

[Series 7: cor haste · coronal · 6.0mm · 1.25mm/px · 1 of 26 slices shown]
[im 1/26]
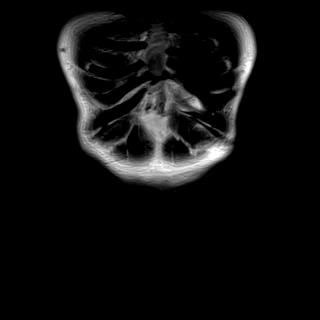

[Series 8: DWI · axial · 6.0mm · 1.42mm/px · 1 of 30 slices shown (1 of 4)]
[im 1/30]
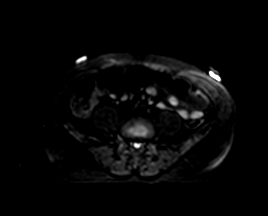

[Series 8: DWI · axial · 6.0mm · 1.42mm/px · 1 of 30 slices shown (2 of 4)]
[im 1/30]
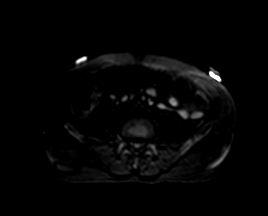

[Series 8: DWI · axial · 6.0mm · 1.42mm/px · 1 of 30 slices shown (3 of 4)]
[im 1/30]
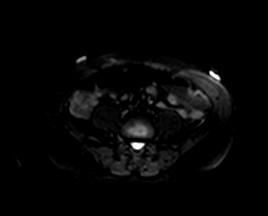

[Series 9: DWI · axial · 6.0mm · 1.42mm/px · 1 of 30 slices shown (4 of 4)]
[im 1/30]
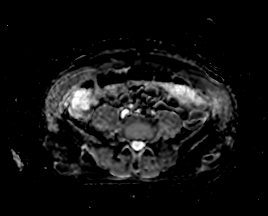

[Series 14: ax in and · axial · 3.0mm · 1.19mm/px · z∈[-135,+78]mm · 2 of 72 slices shown (1 of 2)]
[im 1/72]
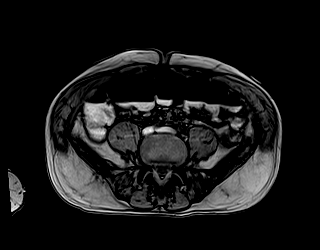
[im 72/72]
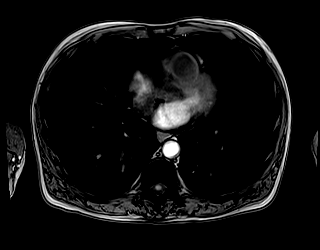

[Series 15: ax in and · axial · 3.0mm · 1.19mm/px · z∈[-135,+78]mm · 3 of 72 slices shown (2 of 2)]
[im 1/72]
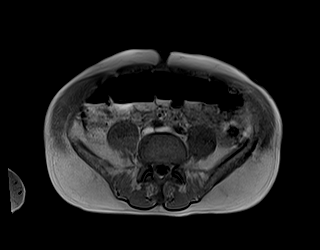
[im 36/72]
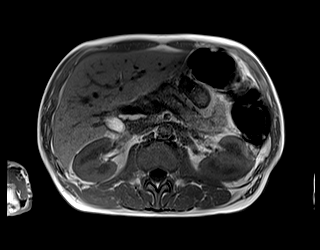
[im 72/72]
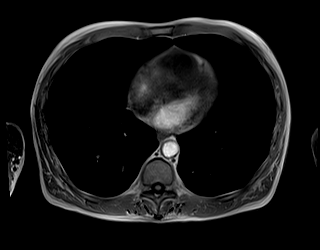

[Series 16: MRCP · coronal · 4.0mm · 1.12mm/px · 1 of 19 slices shown]
[im 1/19]
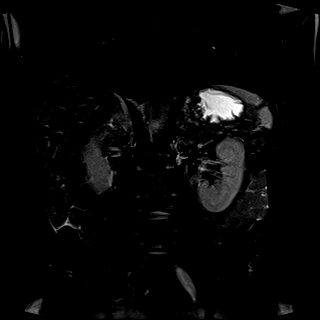

[Series 17: T1 dynamic · axial · non-contrast · 3.0mm · 1.19mm/px · z∈[-135,+78]mm · 3 of 72 slices shown (1 of 4)]
[im 1/72]
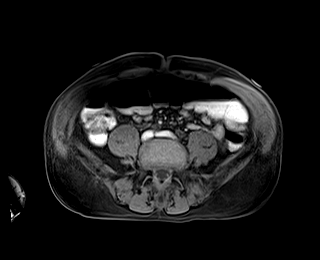
[im 36/72]
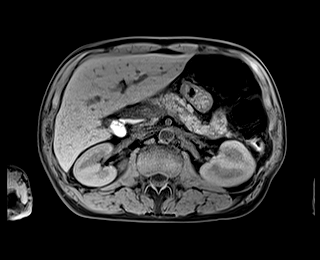
[im 72/72]
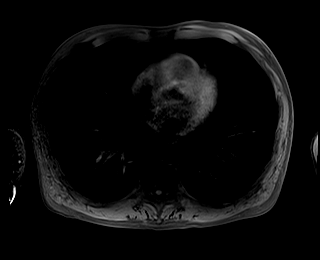

[Series 19: T1 dynamic post-contrast · axial · 3.0mm · 1.19mm/px · z∈[-135,+78]mm · 3 of 72 slices shown (1 of 6)]
[im 1/72]
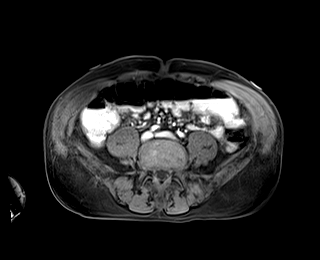
[im 36/72]
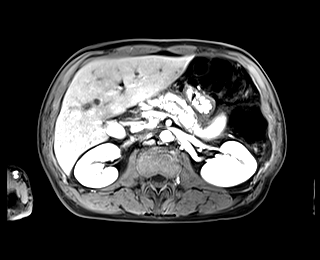
[im 72/72]
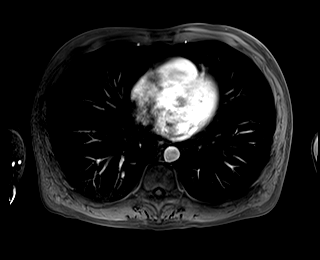

[Series 20: T1 dynamic · axial · 3.0mm · 1.19mm/px · z∈[-135,+78]mm · 3 of 72 slices shown (2 of 4)]
[im 1/72]
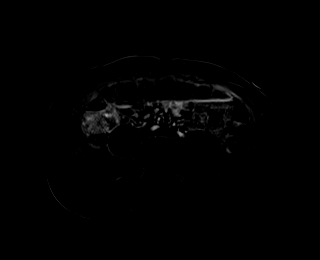
[im 36/72]
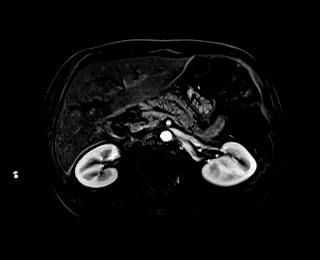
[im 72/72]
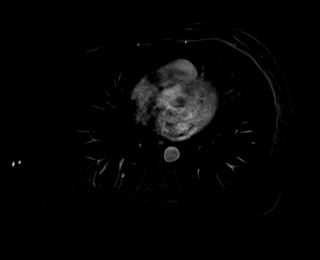

[Series 21: T1 dynamic post-contrast · axial · 3.0mm · 1.19mm/px · z∈[-135,+78]mm · 3 of 72 slices shown (2 of 6)]
[im 1/72]
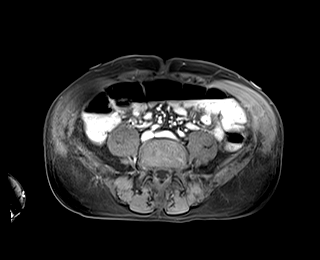
[im 36/72]
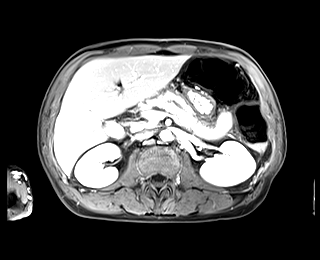
[im 72/72]
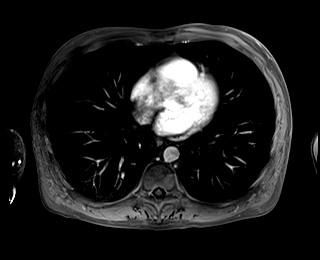

[Series 22: T1 dynamic · axial · 3.0mm · 1.19mm/px · z∈[-135,+78]mm · 3 of 72 slices shown (3 of 4)]
[im 1/72]
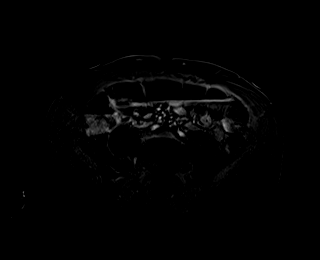
[im 36/72]
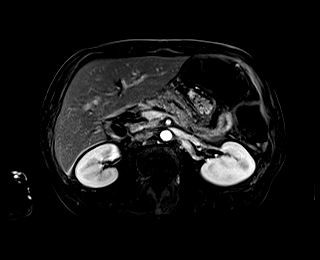
[im 72/72]
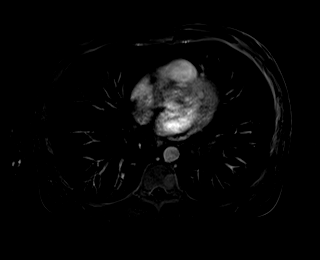

[Series 23: T1 dynamic post-contrast · axial · 3.0mm · 1.19mm/px · z∈[-135,+78]mm · 3 of 72 slices shown (3 of 6)]
[im 1/72]
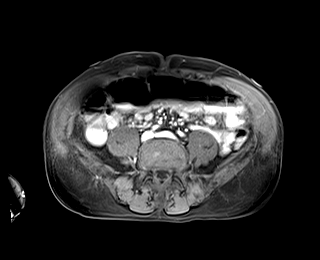
[im 36/72]
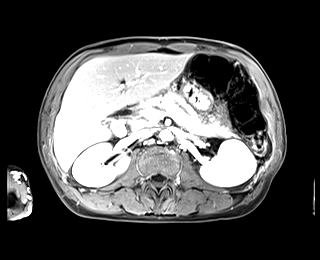
[im 72/72]
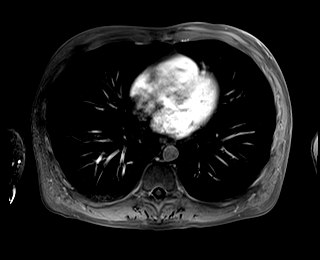

[Series 24: T1 dynamic · axial · 3.0mm · 1.19mm/px · z∈[-135,+78]mm · 3 of 72 slices shown (4 of 4)]
[im 1/72]
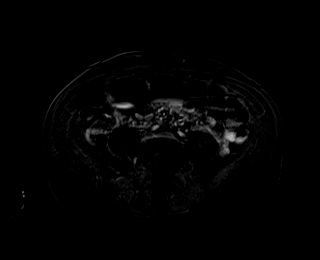
[im 36/72]
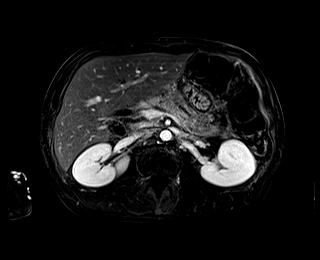
[im 72/72]
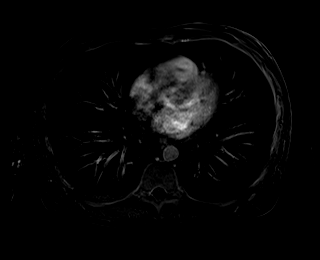

[Series 25: T1 dynamic post-contrast · coronal · 3.0mm · 1.25mm/px · 3 of 72 slices shown (4 of 6)]
[im 1/72]
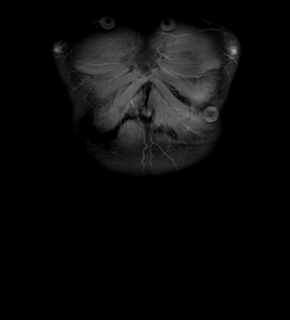
[im 36/72]
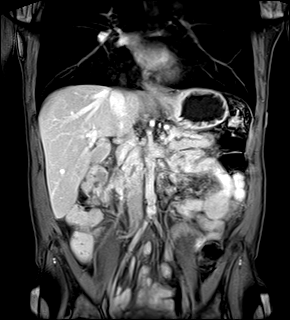
[im 72/72]
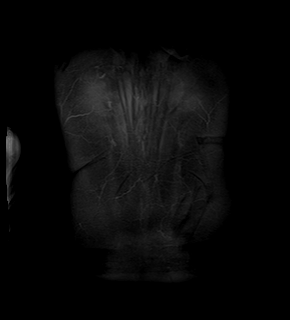

[Series 26: T1 dynamic post-contrast · axial · 3.0mm · 1.19mm/px · z∈[-135,+78]mm · 3 of 72 slices shown (5 of 6)]
[im 1/72]
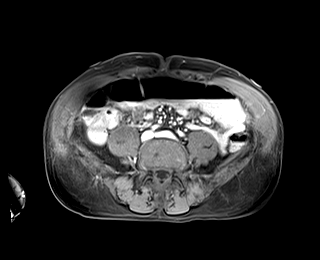
[im 36/72]
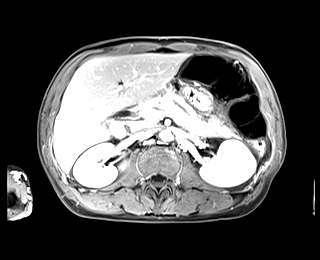
[im 72/72]
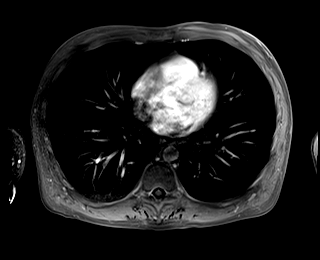

[Series 27: T1 dynamic post-contrast · axial · 3.0mm · 1.19mm/px · z∈[-135,+78]mm · 3 of 72 slices shown (6 of 6)]
[im 1/72]
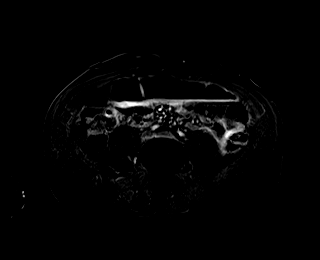
[im 36/72]
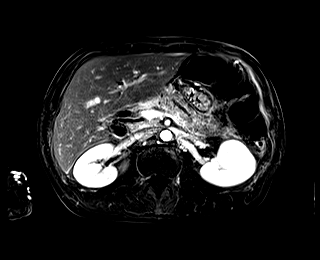
[im 72/72]
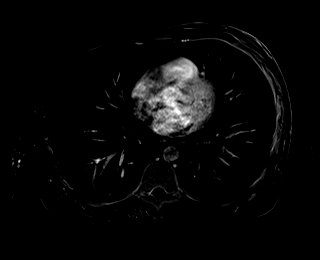

[43 of 48 positions shown; findings below may reference images not displayed]

FINDINGS: Lower chest: Mild bandlike scarring versus atelectasis at the
dependent right lung base.

Hepatobiliary: Normal liver size and configuration. Mild diffuse
hepatic steatosis. Subcentimeter simple lateral segment left liver
cyst. No suspicious liver masses. Nondistended gallbladder with no
gallstones. No gallbladder wall thickening. Layering gallbladder
sludge. No pericholecystic fluid. No intrahepatic biliary ductal
dilatation. Dilated common bile duct with diameter 10 mm with sharp
distal CBD tapering at the level of the pancreatic head. No evidence
of choledocholithiasis. No discrete biliary or ampullary mass. No
beading of the bile ducts.

Pancreas: Diffuse peripancreatic and peripancreatic edema, most
prominent in the body of the pancreas, compatible with acute
pancreatitis. Diffuse main pancreatic duct dilation up to 5 mm
diameter with irregular beaded appearance. No pancreas divisum.
There is an indistinct 1.7 x 1.1 cm mildly T2 hyperintense focus
along the pancreaticoduodenal groove (series 7/image 21) with
surrounding edema with enhancement similar to the pancreatic
parenchyma, favoring groove pancreatitis. Otherwise no discrete
pancreatic mass. No measurable pancreatic or peripancreatic fluid
collections. No evidence of pancreatic parenchymal hemorrhage and no
evidence of nonenhancing pancreatic parenchyma to suggest
necrotizing pancreatitis.

Spleen: Normal size. No mass.

Adrenals/Urinary Tract: Normal adrenals. No hydronephrosis. Normal
kidneys with no renal mass.

Stomach/Bowel: Normal non-distended stomach. Visualized small bowel
is normal caliber, with no bowel wall thickening. Mild gas and
layering fluid throughout the mildly distended visualized colon.

Vascular/Lymphatic: Atherosclerotic nonaneurysmal abdominal aorta.
Patent portal, splenic, hepatic and renal veins. Mildly enlarged
cm peripancreatic lymph node (series 4/image 18).

Other: No abdominal ascites or focal fluid collection.

Musculoskeletal: No aggressive appearing focal osseous lesions.
IMPRESSION: 1. Acute non-necrotizing pancreatitis. Ill-defined small mildly T2
hyperintense focus along the pancreaticoduodenal groove with
surrounding edema and enhancement similar to the pancreatic
parenchyma, favoring groove pancreatitis. Short-term follow-up MRI
abdomen without and with IV contrast recommended in 1-3 months to
document resolution.
2. Diffuse main pancreatic duct dilation up to 5 mm diameter with
irregular beaded appearance, which may indicate underlying chronic
pancreatitis.
3. Dilated common bile duct (10 mm diameter) with sharp distal CBD
tapering at the level of the pancreatic head. No evidence of
choledocholithiasis or discrete biliary or ampullary mass. Finding
is nonspecific and may be due to mass effect from surrounding
inflammation related to acute pancreatitis. This finding can also be
reassessed on short-term follow-up MRI. ERCP could be obtained at
this time as clinically warranted.
4. No measurable fluid collections.
5. Mild peripancreatic lymphadenopathy, nonspecific, favor reactive.
6. Mild diffuse hepatic steatosis.
7. Gallbladder sludge. No gallstones.

## 2020-06-05 MED ORDER — GADOBUTROL 1 MMOL/ML IV SOLN
7.0000 mL | Freq: Once | INTRAVENOUS | Status: AC | PRN
  Administered 2020-06-05: 7 mL via INTRAVENOUS

## 2020-06-05 MED ORDER — METOPROLOL TARTRATE 25 MG PO TABS
12.5000 mg | ORAL_TABLET | Freq: Two times a day (BID) | ORAL | Status: DC
  Administered 2020-06-05 – 2020-06-06 (×3): 12.5 mg via ORAL
  Filled 2020-06-05 (×3): qty 1

## 2020-06-05 MED ORDER — PANTOPRAZOLE SODIUM 40 MG PO TBEC
40.0000 mg | DELAYED_RELEASE_TABLET | Freq: Every day | ORAL | Status: DC
  Administered 2020-06-05 – 2020-06-06 (×2): 40 mg via ORAL
  Filled 2020-06-05 (×2): qty 1

## 2020-06-05 NOTE — Care Management Important Message (Signed)
Important Message  Patient Details  Name: Bryan Fleming MRN: [id number] Date of Birth: 12-06-1960   Medicare Important Message Given:  Yes - Important Message mailed due to current National Emergency     Tommy Medal 06/05/2020, 5:57 PM

## 2020-06-05 NOTE — Progress Notes (Signed)
Pt back from MRI. No c/o nausea or pain at this time. Clear liquids given per new diet order.

## 2020-06-05 NOTE — Progress Notes (Addendum)
PROGRESS NOTE   Bryan Fleming  UMP:[id number] DOB: 05-Jun-1961 DOA: 06/04/2020 PCP: Rosita Fire, MD   Chief Complaint  Patient presents with  . Abdominal Pain    Brief Admission History:  59 y.o. male with medical history significant for chronic alcohol abuse which has gotten worse over the pandemic where he is drinking large amounts of liquor and beer on a daily basis. He reports epigastric abdominal pain that started last night around 9pm.  It has been associated with a day of heavy drinking. He has had mild cases of pancreatitis in the past but this time it feels much worse.  He has had nausea and vomiting and wretching since the pain started.  He has had several episodes of watery diarrhea.  He continues to smoke heavily at least 1 pack per day.  He has not been able to take his meds for past several days due to feeling ill.  He feels that he does nothing during the pandemic except smoke all day and drink alcohol.  He denies chest pain and shortness of breath.    ED Course: Pt was found to have a markedly elevated lipase at 1888. His WBC was 10.9.  His K was 3.1.  His sars 2 coronavirus test was negative as well as negative influenza testing.  His Mg was 2.6.  His AST was 69 and ALT was 32.  Total bilirubin was 1.1.  Glucose was 203.  He is being admitted for acute pancreatitis.    Assessment & Plan:   Principal Problem:   Acute pancreatitis Active Problems:   Hypertension   GERD (gastroesophageal reflux disease)   COPD (chronic obstructive pulmonary disease) (HCC)   Alcohol abuse   Nicotine dependence, cigarettes, with other nicotine-induced disorders   Leukocytosis   Hypokalemia  1. Acute pancreatitis - secondary to chronic alcohol abuse with recent binge drinking - slowly advance diet today, continue IV fluids, IV pain and nausea medication, follow lipase level.  fasting lipid panel normal.  Mild AST elevation consistent with chronic alcohol abuse.  Obtain MRI abdomen  with contrast to rule out underlying mass lesion.    2. Chronic alcohol abuse - Continue librium 25 mg TID, CIWA protocol.  Follow.   3. Leukocytosis - reactive to acute pancreatitis - no signs of infection found.  RESOLVED.  4. Hypokalemia - REPLETED.    5. Essential hypertension -  metoprolol ordered, resumed home amlodipine 6/29.  6. GERD -  protonix ordered for GI protection.  7. Nicotine dependence - He is smoking heavily.  Nicotine patch 21 mg ordered.  Counseled on tobacco cessation.    DVT prophylaxis: enoxaparin   Code Status: Full   Family Communication: sister telephone update   Disposition Plan: Home   Consults called: n/a   Admission status: INP    Status is: Inpatient  Remains inpatient appropriate because:Inpatient level of care appropriate due to severity of illness   Dispo:  Patient From: Home  Planned Disposition: Home  Expected discharge date: 06/06/20  Medically stable for discharge: No  Consultants:   n/a  Procedures:   n/a  Antimicrobials:  n/a  Subjective: Pt says his abdominal pain feels better today.  He unfortunately told social work he is not interested in alcohol treatment program.   Objective: Vitals:   06/05/20 0041 06/05/20 0644 06/05/20 0754 06/05/20 1030  BP: (!) 150/75 135/84  130/66  Pulse: 83 85  75  Resp: 18 19  16   Temp: 98.7 F (37.1 C) 98  F (36.7 C)    TempSrc: Oral     SpO2: 97% 97% 96% 99%  Weight:      Height:        Intake/Output Summary (Last 24 hours) at 06/05/2020 1617 Last data filed at 06/05/2020 0300 Gross per 24 hour  Intake 1000.82 ml  Output --  Net 1000.82 ml   Filed Weights   06/04/20 0914  Weight: 65.8 kg   Examination:  General exam: Appears calm and comfortable  Respiratory system: Clear to auscultation. Respiratory effort normal. Cardiovascular system: S1 & S2 heard. No JVD, murmurs, rubs, gallops or clicks. No pedal edema. Gastrointestinal system: Abdomen is nondistended, soft and very  minimal epigastric tenderness. No organomegaly or masses felt. Normal bowel sounds heard. Central nervous system: Alert and oriented. No focal neurological deficits. Extremities: Symmetric 5 x 5 power. Skin: No rashes, lesions or ulcers Psychiatry: Judgement and insight appear normal. Mood & affect appropriate.   Data Reviewed: I have personally reviewed following labs and imaging studies  CBC: Recent Labs  Lab 06/04/20 0920 06/05/20 0548  WBC 10.9* 7.4  NEUTROABS  --  5.2  HGB 16.3 13.6  HCT 48.2 42.2  MCV 92.7 96.6  PLT 232 197    Basic Metabolic Panel: Recent Labs  Lab 06/04/20 0920 06/05/20 0548  NA 135 133*  K 3.1* 4.4  CL 88* 99  CO2 30 26  GLUCOSE 203* 106*  BUN 5* 5*  CREATININE 0.73 0.61  CALCIUM 9.4 8.8*  MG 2.6* 2.6*  PHOS  --  2.3*    GFR: Estimated Creatinine Clearance: 94.8 mL/min (by C-G formula based on SCr of 0.61 mg/dL).  Liver Function Tests: Recent Labs  Lab 06/04/20 0920 06/05/20 0548  AST 69* 28  ALT 32 20  ALKPHOS 88 63  BILITOT 1.1 1.1  PROT 8.6* 6.7  ALBUMIN 4.4 3.4*    CBG: No results for input(s): GLUCAP in the last 168 hours.  Recent Results (from the past 240 hour(s))  Respiratory Panel by RT PCR (Flu A&B, Covid) - Nasopharyngeal Swab     Status: None   Collection Time: 06/04/20 12:30 PM   Specimen: Nasopharyngeal Swab  Result Value Ref Range Status   SARS Coronavirus 2 by RT PCR NEGATIVE NEGATIVE Final    Comment: (NOTE) SARS-CoV-2 target nucleic acids are NOT DETECTED.  The SARS-CoV-2 RNA is generally detectable in upper respiratoy specimens during the acute phase of infection. The lowest concentration of SARS-CoV-2 viral copies this assay can detect is 131 copies/mL. A negative result does not preclude SARS-Cov-2 infection and should not be used as the sole basis for treatment or other patient management decisions. A negative result may occur with  improper specimen collection/handling, submission of specimen  other than nasopharyngeal swab, presence of viral mutation(s) within the areas targeted by this assay, and inadequate number of viral copies (<131 copies/mL). A negative result must be combined with clinical observations, patient history, and epidemiological information. The expected result is Negative.  Fact Sheet for Patients:  PinkCheek.be  Fact Sheet for Healthcare Providers:  GravelBags.it  This test is no t yet approved or cleared by the [place] FDA and  has been authorized for detection and/or diagnosis of SARS-CoV-2 by FDA under an Emergency Use Authorization (EUA). This EUA will remain  in effect (meaning this test can be used) for the duration of the COVID-19 declaration under Section 564(b)(1) of the Act, 21 U.S.C. section 360bbb-3(b)(1), unless the authorization is terminated or revoked sooner.  Influenza A by PCR NEGATIVE NEGATIVE Final   Influenza B by PCR NEGATIVE NEGATIVE Final    Comment: (NOTE) The Xpert Xpress SARS-CoV-2/FLU/RSV assay is intended as an aid in  the diagnosis of influenza from Nasopharyngeal swab specimens and  should not be used as a sole basis for treatment. Nasal washings and  aspirates are unacceptable for Xpert Xpress SARS-CoV-2/FLU/RSV  testing.  Fact Sheet for Patients: PinkCheek.be  Fact Sheet for Healthcare Providers: GravelBags.it  This test is not yet approved or cleared by the [place] FDA and  has been authorized for detection and/or diagnosis of SARS-CoV-2 by  FDA under an Emergency Use Authorization (EUA). This EUA will remain  in effect (meaning this test can be used) for the duration of the  Covid-19 declaration under Section 564(b)(1) of the Act, 21  U.S.C. section 360bbb-3(b)(1), unless the authorization is  terminated or revoked. Performed at [facility], [address] [place], [place]  [address]      Radiology Studies: MR ABDOMEN W WO CONTRAST  Result Date: 06/05/2020 CLINICAL DATA:  Inpatient. Acute pancreatitis with indeterminate small low-attenuation focus along the pancreaticoduodenal groove on recent CT. EXAM: MRI ABDOMEN WITHOUT AND WITH CONTRAST TECHNIQUE: Multiplanar multisequence MR imaging of the abdomen was performed both before and after the administration of intravenous contrast. CONTRAST:  67mL GADAVIST GADOBUTROL 1 MMOL/ML IV SOLN COMPARISON:  05/27/2020 CT abdomen/pelvis. FINDINGS: Lower chest: Mild bandlike scarring versus atelectasis at the dependent right lung base. Hepatobiliary: Normal liver size and configuration. Mild diffuse hepatic steatosis. Subcentimeter simple lateral segment left liver cyst. No suspicious liver masses. Nondistended gallbladder with no gallstones. No gallbladder wall thickening. Layering gallbladder sludge. No pericholecystic fluid. No intrahepatic biliary ductal dilatation. Dilated common bile duct with diameter 10 mm with sharp distal CBD tapering at the level of the pancreatic head. No evidence of choledocholithiasis. No discrete biliary or ampullary mass. No beading of the bile ducts. Pancreas: Diffuse peripancreatic and peripancreatic edema, most prominent in the body of the pancreas, compatible with acute pancreatitis. Diffuse main pancreatic duct dilation up to 5 mm diameter with irregular beaded appearance. No pancreas divisum. There is an indistinct 1.7 x 1.1 cm mildly T2 hyperintense focus along the pancreaticoduodenal groove (series 7/image 21) with surrounding edema with enhancement similar to the pancreatic parenchyma, favoring groove pancreatitis. Otherwise no discrete pancreatic mass. No measurable pancreatic or peripancreatic fluid collections. No evidence of pancreatic parenchymal hemorrhage and no evidence of nonenhancing pancreatic parenchyma to suggest necrotizing pancreatitis. Spleen: Normal size. No mass. Adrenals/Urinary Tract:  Normal adrenals. No hydronephrosis. Normal kidneys with no renal mass. Stomach/Bowel: Normal non-distended stomach. Visualized small bowel is normal caliber, with no bowel wall thickening. Mild gas and layering fluid throughout the mildly distended visualized colon. Vascular/Lymphatic: Atherosclerotic nonaneurysmal abdominal aorta. Patent portal, splenic, hepatic and renal veins. Mildly enlarged 1.2 cm peripancreatic lymph node (series 4/image 18). Other: No abdominal ascites or focal fluid collection. Musculoskeletal: No aggressive appearing focal osseous lesions. IMPRESSION: 1. Acute non-necrotizing pancreatitis. Ill-defined small mildly T2 hyperintense focus along the pancreaticoduodenal groove with surrounding edema and enhancement similar to the pancreatic parenchyma, favoring groove pancreatitis. Short-term follow-up MRI abdomen without and with IV contrast recommended in 1-3 months to document resolution. 2. Diffuse main pancreatic duct dilation up to 5 mm diameter with irregular beaded appearance, which may indicate underlying chronic pancreatitis. 3. Dilated common bile duct (10 mm diameter) with sharp distal CBD tapering at the level of the pancreatic head. No evidence of choledocholithiasis or discrete biliary or  ampullary mass. Finding is nonspecific and may be due to mass effect from surrounding inflammation related to acute pancreatitis. This finding can also be reassessed on short-term follow-up MRI. ERCP could be obtained at this time as clinically warranted. 4. No measurable fluid collections. 5. Mild peripancreatic lymphadenopathy, nonspecific, favor reactive. 6. Mild diffuse hepatic steatosis. 7. Gallbladder sludge. No gallstones. Electronically Signed   By: Ilona Sorrel M.D.   On: 06/05/2020 11:08   CT ABDOMEN PELVIS W CONTRAST  Result Date: 06/04/2020 CLINICAL DATA:  Abdominal pain with nausea, vomiting and diarrhea. Concern for pancreatitis. EXAM: CT ABDOMEN AND PELVIS WITH CONTRAST  TECHNIQUE: Multidetector CT imaging of the abdomen and pelvis was performed using the standard protocol following bolus administration of intravenous contrast. CONTRAST:  128mL OMNIPAQUE IOHEXOL 300 MG/ML  SOLN COMPARISON:  CT abdomen pelvis dated 03/29/2019 FINDINGS: Lower chest: No acute abnormality. Hepatobiliary: No focal liver abnormality is seen. No gallstones or gallbladder wall thickening. The common bile duct is dilated, measuring up to 10 mm. Pancreas: There is edema and surrounding inflammatory changes involving the pancreatic body and head. The pancreatic parenchyma enhances without evidence of necrosis. Hypoattenuation in the groove between the pancreatic head and the duodenum (series 2 images 28-30) is favored to reflect inflammatory changes, however an underlying mass such as an acute peripancreatic fluid collection or malignancy. The pancreatic duct is at the upper limits of normal but is not appear dilated. Spleen: Normal in size without focal abnormality. Adrenals/Urinary Tract: Adrenal glands are unremarkable. Kidneys are normal, without renal calculi, focal lesion, or hydronephrosis. Bladder is unremarkable. Stomach/Bowel: Stomach is within normal limits. Appendix appears normal. No evidence of bowel wall thickening, distention, or inflammatory changes. Vascular/Lymphatic: No significant vascular findings are present. No enlarged abdominal or pelvic lymph nodes. No evidence of portal vein thrombosis or splenic artery aneurysm. Reproductive: The prostate is enlarged, measuring 5.2 cm in transverse dimension. Other: No abdominal wall hernia or abnormality. No abdominopelvic ascites. Musculoskeletal: No acute or significant osseous findings. IMPRESSION: 1. Acute interstitial edematous pancreatitis. Hypoattenuation in the groove between the pancreatic head and the duodenum is favored to reflect inflammatory changes, however an underlying mass such as an acute peripancreatic fluid collection or  underlying malignancy could have a similar appearance. Follow-up MR abdomen could be considered to document resolution of this finding after the patient's acute episode has resolved. 2. Dilated common bile duct. Aortic Atherosclerosis (ICD10-I70.0). Electronically Signed   By: Zerita Boers M.D.   On: 06/04/2020 12:13   MR 3D Recon At Scanner  Result Date: 06/05/2020 CLINICAL DATA:  Inpatient. Acute pancreatitis with indeterminate small low-attenuation focus along the pancreaticoduodenal groove on recent CT. EXAM: MRI ABDOMEN WITHOUT AND WITH CONTRAST TECHNIQUE: Multiplanar multisequence MR imaging of the abdomen was performed both before and after the administration of intravenous contrast. CONTRAST:  69mL GADAVIST GADOBUTROL 1 MMOL/ML IV SOLN COMPARISON:  05/27/2020 CT abdomen/pelvis. FINDINGS: Lower chest: Mild bandlike scarring versus atelectasis at the dependent right lung base. Hepatobiliary: Normal liver size and configuration. Mild diffuse hepatic steatosis. Subcentimeter simple lateral segment left liver cyst. No suspicious liver masses. Nondistended gallbladder with no gallstones. No gallbladder wall thickening. Layering gallbladder sludge. No pericholecystic fluid. No intrahepatic biliary ductal dilatation. Dilated common bile duct with diameter 10 mm with sharp distal CBD tapering at the level of the pancreatic head. No evidence of choledocholithiasis. No discrete biliary or ampullary mass. No beading of the bile ducts. Pancreas: Diffuse peripancreatic and peripancreatic edema, most prominent in the body of the pancreas,  compatible with acute pancreatitis. Diffuse main pancreatic duct dilation up to 5 mm diameter with irregular beaded appearance. No pancreas divisum. There is an indistinct 1.7 x 1.1 cm mildly T2 hyperintense focus along the pancreaticoduodenal groove (series 7/image 21) with surrounding edema with enhancement similar to the pancreatic parenchyma, favoring groove pancreatitis.  Otherwise no discrete pancreatic mass. No measurable pancreatic or peripancreatic fluid collections. No evidence of pancreatic parenchymal hemorrhage and no evidence of nonenhancing pancreatic parenchyma to suggest necrotizing pancreatitis. Spleen: Normal size. No mass. Adrenals/Urinary Tract: Normal adrenals. No hydronephrosis. Normal kidneys with no renal mass. Stomach/Bowel: Normal non-distended stomach. Visualized small bowel is normal caliber, with no bowel wall thickening. Mild gas and layering fluid throughout the mildly distended visualized colon. Vascular/Lymphatic: Atherosclerotic nonaneurysmal abdominal aorta. Patent portal, splenic, hepatic and renal veins. Mildly enlarged 1.2 cm peripancreatic lymph node (series 4/image 18). Other: No abdominal ascites or focal fluid collection. Musculoskeletal: No aggressive appearing focal osseous lesions. IMPRESSION: 1. Acute non-necrotizing pancreatitis. Ill-defined small mildly T2 hyperintense focus along the pancreaticoduodenal groove with surrounding edema and enhancement similar to the pancreatic parenchyma, favoring groove pancreatitis. Short-term follow-up MRI abdomen without and with IV contrast recommended in 1-3 months to document resolution. 2. Diffuse main pancreatic duct dilation up to 5 mm diameter with irregular beaded appearance, which may indicate underlying chronic pancreatitis. 3. Dilated common bile duct (10 mm diameter) with sharp distal CBD tapering at the level of the pancreatic head. No evidence of choledocholithiasis or discrete biliary or ampullary mass. Finding is nonspecific and may be due to mass effect from surrounding inflammation related to acute pancreatitis. This finding can also be reassessed on short-term follow-up MRI. ERCP could be obtained at this time as clinically warranted. 4. No measurable fluid collections. 5. Mild peripancreatic lymphadenopathy, nonspecific, favor reactive. 6. Mild diffuse hepatic steatosis. 7. Gallbladder  sludge. No gallstones. Electronically Signed   By: Ilona Sorrel M.D.   On: 06/05/2020 11:08   Scheduled Meds: . amLODipine  5 mg Oral Daily  . aspirin EC  81 mg Oral Daily  . chlordiazePOXIDE  25 mg Oral TID  . enoxaparin (LOVENOX) injection  40 mg Subcutaneous Q24H  . folic acid  1 mg Oral Daily  . LORazepam  0-4 mg Intravenous Q6H   Followed by  . [START ON 06/06/2020] LORazepam  0-4 mg Intravenous Q12H  . metoprolol tartrate  12.5 mg Oral BID  . mometasone-formoterol  2 puff Inhalation BID  . multivitamin with minerals  1 tablet Oral Daily  . nicotine  21 mg Transdermal Daily  . pantoprazole  40 mg Oral Q0600  . thiamine  100 mg Oral Daily   Or  . thiamine  100 mg Intravenous Daily  . vitamin B-12  500 mcg Oral Daily   Continuous Infusions: . 0.9 % NaCl with KCl 20 mEq / L 60 mL/hr at 06/05/20 1026     LOS: 1 day   Time spent: 34 minutes   [place] Bohac Wynetta Emery, MD How to contact the [facility] Attending or Consulting provider [place] or covering provider during after hours [place], for this patient?  1. Check the care team in [facility] and look for a) attending/consulting [facility] provider listed and b) the [facility] team listed 2. Log into [email or web address] and use [place]'s universal password to access. If you do not have the password, please contact the hospital operator. 3. Locate the [facility] provider you are looking for under [facility] and page to a number that you can be  directly reached. 4. If you still have difficulty reaching the provider, please page the [facility] (Director on Call) for the Hospitalists listed on amion for assistance.  06/05/2020, 4:17 PM

## 2020-06-06 MED ORDER — METOPROLOL TARTRATE 25 MG PO TABS: 12.5000 mg | ORAL_TABLET | Freq: Two times a day (BID) | ORAL | 0 refills | Status: DC

## 2020-06-06 MED ORDER — THIAMINE HCL 100 MG PO TABS
100.0000 mg | ORAL_TABLET | Freq: Every day | ORAL | 2 refills | Status: DC
Start: 2020-06-07 — End: 2020-12-23

## 2020-06-06 MED ORDER — FOLIC ACID 1 MG PO TABS
1.0000 mg | ORAL_TABLET | Freq: Every day | ORAL | 1 refills | Status: DC
Start: 2020-06-07 — End: 2020-12-23

## 2020-06-06 MED ORDER — ADULT MULTIVITAMIN W/MINERALS CH
1.0000 | ORAL_TABLET | Freq: Every day | ORAL | Status: DC
Start: 2020-06-07 — End: 2022-12-01

## 2020-06-06 MED ORDER — CHLORDIAZEPOXIDE HCL 25 MG PO CAPS: 25.0000 mg | ORAL_CAPSULE | Freq: Three times a day (TID) | ORAL | 0 refills | Status: DC | PRN

## 2020-06-06 NOTE — Plan of Care (Signed)

## 2020-06-06 NOTE — Progress Notes (Signed)
Nsg Discharge Note  Admit Date:  06/04/2020 Discharge date: 06/06/2020   Bryan Fleming to be D/C'd Home per MD order.  AVS completed.  Copy for chart, and copy for patient signed, and dated. Removed IVs-clean, dry, intact. Reviewed d/c paperwork with patient. Answered all questions. Wheeled stable and belongings to ED entrance where he walked to his car. Patient/caregiver able to verbalize understanding.  Discharge Medication: Allergies as of 06/06/2020      Reactions   Other Nausea Only, Other (See Comments)   No spicy foods- Patient cannot tolerate- HEARTBURN      Medication List    STOP taking these medications   pantoprazole 20 MG tablet Commonly known as: PROTONIX   sucralfate 1 g tablet Commonly known as: Carafate     TAKE these medications   amLODipine 5 MG tablet Commonly known as: NORVASC Take 5 mg by mouth daily.   aspirin 81 MG tablet Take 81 mg by mouth daily.   bismuth subsalicylate 638 MG chewable tablet Commonly known as: PEPTO BISMOL Chew 524 mg by mouth 3 (three) times daily as needed for indigestion.   chlordiazePOXIDE 25 MG capsule Commonly known as: LIBRIUM Take 1 capsule (25 mg total) by mouth 3 (three) times daily as needed for anxiety or withdrawal.   diphenhydrAMINE 25 MG tablet Commonly known as: BENADRYL Take 25 mg by mouth daily as needed for itching or allergies.   folic acid 1 MG tablet Commonly known as: FOLVITE Take 1 tablet (1 mg total) by mouth daily. Start taking on: June 07, 2020   loperamide 2 MG tablet Commonly known as: IMODIUM A-D Take 2 mg by mouth daily as needed for diarrhea or loose stools.   metoprolol tartrate 25 MG tablet Commonly known as: LOPRESSOR Take 0.5 tablets (12.5 mg total) by mouth 2 (two) times daily.   multivitamin with minerals Tabs tablet Take 1 tablet by mouth daily. Start taking on: June 07, 2020   Mylanta 200-200-20 MG/5ML suspension Generic drug: alum & mag  hydroxide-simeth Take 30 mLs by mouth every 6 (six) hours as needed for indigestion or heartburn.   omeprazole 40 MG capsule Commonly known as: PRILOSEC Take 40 mg by mouth daily.   oxymetazoline 0.05 % nasal spray Commonly known as: AFRIN Place 2 sprays into both nostrils daily as needed for congestion.   Proventil HFA 108 (90 Base) MCG/ACT inhaler Generic drug: albuterol INHALE 2 PUFFS BY MOUTH EVERY 6 HOURS AS NEEDED FOR COUGHING, WHEEZING, OR SHORTNESS OF BREATH What changed: See the new instructions.   Symbicort 160-4.5 MCG/ACT inhaler Generic drug: budesonide-formoterol Inhale 2 puffs into the lungs in the morning and at bedtime.   tetrahydrozoline 0.05 % ophthalmic solution Place 1 drop into both eyes as needed (for dry eyes).   thiamine 100 MG tablet Take 1 tablet (100 mg total) by mouth daily. Start taking on: June 07, 2020   vitamin B-12 500 MCG tablet Commonly known as: CYANOCOBALAMIN Take 500 mcg by mouth daily.       Discharge Assessment: Vitals:   06/06/20 0444 06/06/20 0733  BP: 126/75   Pulse: 84   Resp:    Temp: 98.8 F (37.1 C)   SpO2: 96% 97%   Skin clean, dry and intact without evidence of skin break down, no evidence of skin tears noted. IV catheter discontinued intact. Site without signs and symptoms of complications - no redness or edema noted at insertion site, patient denies c/o pain - only slight tenderness at site.  Dressing with slight pressure applied.  D/c Instructions-Education: Discharge instructions given to patient/family with verbalized understanding. D/c education completed with patient/family including follow up instructions, medication list, d/c activities limitations if indicated, with other d/c instructions as indicated by MD - patient able to verbalize understanding, all questions fully answered. Patient instructed to return to ED, call 911, or call MD for any changes in condition.  Patient escorted via [place], and D/C home via  private auto.  Santa Lighter, RN 06/06/2020 1:01 PM

## 2020-06-06 NOTE — Discharge Summary (Signed)
Physician Discharge Summary  Bryan Fleming XNA:[id number] DOB: 12-Jun-1961 DOA: 06/04/2020  PCP: Rosita Fire, MD  Admit date: 06/04/2020 Discharge date: 06/06/2020  Admitted From:  Home  Disposition: Home   Recommendations for Outpatient Follow-up:  1. Follow up with PCP in 1 weeks 2. Please order repeat MRI abdomen with and without contrast in 1-3 months to follow up pancreatic changes  Discharge Condition: STABLE   CODE STATUS: FULL    Brief Hospitalization Summary: ADMISSION HPI: Bryan Fleming is a 59 y.o. male with medical history significant for chronic alcohol abuse which has gotten worse over the pandemic where he is drinking large amounts of liquor and beer on a daily basis. He reports epigastric abdominal pain that started last night around 9pm.  It has been associated with a day of heavy drinking. He has had mild cases of pancreatitis in the past but this time it feels much worse.  He has had nausea and vomiting and wretching since the pain started.  He has had several episodes of watery diarrhea.  He continues to smoke heavily at least 1 pack per day.  He has not been able to take his meds for past several days due to feeling ill.  He feels that he does nothing during the pandemic except smoke all day and drink alcohol.  He denies chest pain and shortness of breath.    ED Course: Pt was found to have a markedly elevated lipase at 1888. His WBC was 10.9.  His K was 3.1.  His sars 2 coronavirus test was negative as well as negative influenza testing.  His Mg was 2.6.  His AST was 69 and ALT was 32.  Total bilirubin was 1.1.  Glucose was 203.  He is being admitted for acute pancreatitis.    Please see all hospital notes, images, labs for full details of the hospitalization.  Hospital Course  1. Acute pancreatitis - secondary to chronic alcohol abuse with recent binge drinking - slowly advance diet today, continue IV fluids, IV pain and nausea medication, followed  lipase level until it came down. fasting lipid panel normal. Mild AST elevation consistent with chronic alcohol abuse. MRI abdomen with contrast to rule out underlying mass lesion was completed but no discrete or clear mass identified.  Radiologist recommended having a repeat MRI with and without contrast done in 1-3 months to follow up full resolution of pancreatitis. I notified patient of this need. I placed future order for 3 months. I am referring to outpatient GI and follow up with his PCP.  I have strongly advised against further alcohol consumption.  Pt verbalized understanding.  2. Chronic alcohol abuse - librium 25 mg TID prn withdrawal or anxiety ordered to try to help keep patient off the alcohol. CIWA protocol was used in the hospital but no signs of acute alcohol withdrawal.   3. Leukocytosis - reactive to acute pancreatitis - no signs of infection found. RESOLVED.  4. Hypokalemia - REPLETED.    5. Essential hypertension -  metoprolol ordered, resumed home amlodipine 6/29.  6. GERD -  protonix ordered for GI protection.  7. Nicotine dependence - He is smoking heavily. Nicotine patch 21 mg ordered. Counseled on tobacco cessation.   DVT prophylaxis:enoxaparin Code Status:Full Family Communication:sister telephone update Disposition Plan:Home Consults called:n/a Admission status:INP  Discharge Diagnoses:  Principal Problem:   Acute pancreatitis Active Problems:   Hypertension   GERD (gastroesophageal reflux disease)   COPD (chronic obstructive pulmonary disease) (HCC)   Alcohol  abuse   Nicotine dependence, cigarettes, with other nicotine-induced disorders   Leukocytosis   Hypokalemia  Discharge Instructions: Discharge Instructions    Ambulatory referral to Gastroenterology   Complete by: As directed      Allergies as of 06/06/2020      Reactions   Other Nausea Only, Other (See Comments)   No spicy foods- Patient cannot tolerate- HEARTBURN       Medication List    STOP taking these medications   pantoprazole 20 MG tablet Commonly known as: PROTONIX   sucralfate 1 g tablet Commonly known as: Carafate     TAKE these medications   amLODipine 5 MG tablet Commonly known as: NORVASC Take 5 mg by mouth daily.   aspirin 81 MG tablet Take 81 mg by mouth daily.   bismuth subsalicylate 568 MG chewable tablet Commonly known as: PEPTO BISMOL Chew 524 mg by mouth 3 (three) times daily as needed for indigestion.   chlordiazePOXIDE 25 MG capsule Commonly known as: LIBRIUM Take 1 capsule (25 mg total) by mouth 3 (three) times daily as needed for anxiety or withdrawal.   diphenhydrAMINE 25 MG tablet Commonly known as: BENADRYL Take 25 mg by mouth daily as needed for itching or allergies.   folic acid 1 MG tablet Commonly known as: FOLVITE Take 1 tablet (1 mg total) by mouth daily. Start taking on: June 07, 2020   loperamide 2 MG tablet Commonly known as: IMODIUM A-D Take 2 mg by mouth daily as needed for diarrhea or loose stools.   metoprolol tartrate 25 MG tablet Commonly known as: LOPRESSOR Take 0.5 tablets (12.5 mg total) by mouth 2 (two) times daily.   multivitamin with minerals Tabs tablet Take 1 tablet by mouth daily. Start taking on: June 07, 2020   Mylanta 200-200-20 MG/5ML suspension Generic drug: alum & mag hydroxide-simeth Take 30 mLs by mouth every 6 (six) hours as needed for indigestion or heartburn.   omeprazole 40 MG capsule Commonly known as: PRILOSEC Take 40 mg by mouth daily.   oxymetazoline 0.05 % nasal spray Commonly known as: AFRIN Place 2 sprays into both nostrils daily as needed for congestion.   Proventil HFA 108 (90 Base) MCG/ACT inhaler Generic drug: albuterol INHALE 2 PUFFS BY MOUTH EVERY 6 HOURS AS NEEDED FOR COUGHING, WHEEZING, OR SHORTNESS OF BREATH What changed: See the new instructions.   Symbicort 160-4.5 MCG/ACT inhaler Generic drug: budesonide-formoterol Inhale 2  puffs into the lungs in the morning and at bedtime.   tetrahydrozoline 0.05 % ophthalmic solution Place 1 drop into both eyes as needed (for dry eyes).   thiamine 100 MG tablet Take 1 tablet (100 mg total) by mouth daily. Start taking on: June 07, 2020   vitamin B-12 500 MCG tablet Commonly known as: CYANOCOBALAMIN Take 500 mcg by mouth daily.       Follow-up Information    Rosita Fire, MD. Schedule an appointment as soon as possible for a visit in 5 day(s).   Specialty: Internal Medicine Why: Hospital Follow Up  Contact information: [address] [place] [address] [phone]              Allergies  Allergen Reactions  . Other Nausea Only and Other (See Comments)    No spicy foods- Patient cannot tolerate- HEARTBURN   Allergies as of 06/06/2020      Reactions   Other Nausea Only, Other (See Comments)   No spicy foods- Patient cannot tolerate- HEARTBURN      Medication  List    STOP taking these medications   pantoprazole 20 MG tablet Commonly known as: PROTONIX   sucralfate 1 g tablet Commonly known as: Carafate     TAKE these medications   amLODipine 5 MG tablet Commonly known as: NORVASC Take 5 mg by mouth daily.   aspirin 81 MG tablet Take 81 mg by mouth daily.   bismuth subsalicylate 275 MG chewable tablet Commonly known as: PEPTO BISMOL Chew 524 mg by mouth 3 (three) times daily as needed for indigestion.   chlordiazePOXIDE 25 MG capsule Commonly known as: LIBRIUM Take 1 capsule (25 mg total) by mouth 3 (three) times daily as needed for anxiety or withdrawal.   diphenhydrAMINE 25 MG tablet Commonly known as: BENADRYL Take 25 mg by mouth daily as needed for itching or allergies.   folic acid 1 MG tablet Commonly known as: FOLVITE Take 1 tablet (1 mg total) by mouth daily. Start taking on: June 07, 2020   loperamide 2 MG tablet Commonly known as: IMODIUM A-D Take 2 mg by mouth daily as needed for diarrhea or  loose stools.   metoprolol tartrate 25 MG tablet Commonly known as: LOPRESSOR Take 0.5 tablets (12.5 mg total) by mouth 2 (two) times daily.   multivitamin with minerals Tabs tablet Take 1 tablet by mouth daily. Start taking on: June 07, 2020   Mylanta 200-200-20 MG/5ML suspension Generic drug: alum & mag hydroxide-simeth Take 30 mLs by mouth every 6 (six) hours as needed for indigestion or heartburn.   omeprazole 40 MG capsule Commonly known as: PRILOSEC Take 40 mg by mouth daily.   oxymetazoline 0.05 % nasal spray Commonly known as: AFRIN Place 2 sprays into both nostrils daily as needed for congestion.   Proventil HFA 108 (90 Base) MCG/ACT inhaler Generic drug: albuterol INHALE 2 PUFFS BY MOUTH EVERY 6 HOURS AS NEEDED FOR COUGHING, WHEEZING, OR SHORTNESS OF BREATH What changed: See the new instructions.   Symbicort 160-4.5 MCG/ACT inhaler Generic drug: budesonide-formoterol Inhale 2 puffs into the lungs in the morning and at bedtime.   tetrahydrozoline 0.05 % ophthalmic solution Place 1 drop into both eyes as needed (for dry eyes).   thiamine 100 MG tablet Take 1 tablet (100 mg total) by mouth daily. Start taking on: June 07, 2020   vitamin B-12 500 MCG tablet Commonly known as: CYANOCOBALAMIN Take 500 mcg by mouth daily.       Procedures/Studies: MR ABDOMEN W WO CONTRAST  Result Date: 06/05/2020 CLINICAL DATA:  Inpatient. Acute pancreatitis with indeterminate small low-attenuation focus along the pancreaticoduodenal groove on recent CT. EXAM: MRI ABDOMEN WITHOUT AND WITH CONTRAST TECHNIQUE: Multiplanar multisequence MR imaging of the abdomen was performed both before and after the administration of intravenous contrast. CONTRAST:  77mL GADAVIST GADOBUTROL 1 MMOL/ML IV SOLN COMPARISON:  05/27/2020 CT abdomen/pelvis. FINDINGS: Lower chest: Mild bandlike scarring versus atelectasis at the dependent right lung base. Hepatobiliary: Normal liver size and  configuration. Mild diffuse hepatic steatosis. Subcentimeter simple lateral segment left liver cyst. No suspicious liver masses. Nondistended gallbladder with no gallstones. No gallbladder wall thickening. Layering gallbladder sludge. No pericholecystic fluid. No intrahepatic biliary ductal dilatation. Dilated common bile duct with diameter 10 mm with sharp distal CBD tapering at the level of the pancreatic head. No evidence of choledocholithiasis. No discrete biliary or ampullary mass. No beading of the bile ducts. Pancreas: Diffuse peripancreatic and peripancreatic edema, most prominent in the body of the pancreas, compatible with acute pancreatitis. Diffuse main pancreatic duct dilation up to  5 mm diameter with irregular beaded appearance. No pancreas divisum. There is an indistinct 1.7 x 1.1 cm mildly T2 hyperintense focus along the pancreaticoduodenal groove (series 7/image 21) with surrounding edema with enhancement similar to the pancreatic parenchyma, favoring groove pancreatitis. Otherwise no discrete pancreatic mass. No measurable pancreatic or peripancreatic fluid collections. No evidence of pancreatic parenchymal hemorrhage and no evidence of nonenhancing pancreatic parenchyma to suggest necrotizing pancreatitis. Spleen: Normal size. No mass. Adrenals/Urinary Tract: Normal adrenals. No hydronephrosis. Normal kidneys with no renal mass. Stomach/Bowel: Normal non-distended stomach. Visualized small bowel is normal caliber, with no bowel wall thickening. Mild gas and layering fluid throughout the mildly distended visualized colon. Vascular/Lymphatic: Atherosclerotic nonaneurysmal abdominal aorta. Patent portal, splenic, hepatic and renal veins. Mildly enlarged 1.2 cm peripancreatic lymph node (series 4/image 18). Other: No abdominal ascites or focal fluid collection. Musculoskeletal: No aggressive appearing focal osseous lesions. IMPRESSION: 1. Acute non-necrotizing pancreatitis. Ill-defined small mildly T2  hyperintense focus along the pancreaticoduodenal groove with surrounding edema and enhancement similar to the pancreatic parenchyma, favoring groove pancreatitis. Short-term follow-up MRI abdomen without and with IV contrast recommended in 1-3 months to document resolution. 2. Diffuse main pancreatic duct dilation up to 5 mm diameter with irregular beaded appearance, which may indicate underlying chronic pancreatitis. 3. Dilated common bile duct (10 mm diameter) with sharp distal CBD tapering at the level of the pancreatic head. No evidence of choledocholithiasis or discrete biliary or ampullary mass. Finding is nonspecific and may be due to mass effect from surrounding inflammation related to acute pancreatitis. This finding can also be reassessed on short-term follow-up MRI. ERCP could be obtained at this time as clinically warranted. 4. No measurable fluid collections. 5. Mild peripancreatic lymphadenopathy, nonspecific, favor reactive. 6. Mild diffuse hepatic steatosis. 7. Gallbladder sludge. No gallstones. Electronically Signed   By: Ilona Sorrel M.D.   On: 06/05/2020 11:08   CT ABDOMEN PELVIS W CONTRAST  Result Date: 06/04/2020 CLINICAL DATA:  Abdominal pain with nausea, vomiting and diarrhea. Concern for pancreatitis. EXAM: CT ABDOMEN AND PELVIS WITH CONTRAST TECHNIQUE: Multidetector CT imaging of the abdomen and pelvis was performed using the standard protocol following bolus administration of intravenous contrast. CONTRAST:  132mL OMNIPAQUE IOHEXOL 300 MG/ML  SOLN COMPARISON:  CT abdomen pelvis dated 03/29/2019 FINDINGS: Lower chest: No acute abnormality. Hepatobiliary: No focal liver abnormality is seen. No gallstones or gallbladder wall thickening. The common bile duct is dilated, measuring up to 10 mm. Pancreas: There is edema and surrounding inflammatory changes involving the pancreatic body and head. The pancreatic parenchyma enhances without evidence of necrosis. Hypoattenuation in the groove  between the pancreatic head and the duodenum (series 2 images 28-30) is favored to reflect inflammatory changes, however an underlying mass such as an acute peripancreatic fluid collection or malignancy. The pancreatic duct is at the upper limits of normal but is not appear dilated. Spleen: Normal in size without focal abnormality. Adrenals/Urinary Tract: Adrenal glands are unremarkable. Kidneys are normal, without renal calculi, focal lesion, or hydronephrosis. Bladder is unremarkable. Stomach/Bowel: Stomach is within normal limits. Appendix appears normal. No evidence of bowel wall thickening, distention, or inflammatory changes. Vascular/Lymphatic: No significant vascular findings are present. No enlarged abdominal or pelvic lymph nodes. No evidence of portal vein thrombosis or splenic artery aneurysm. Reproductive: The prostate is enlarged, measuring 5.2 cm in transverse dimension. Other: No abdominal wall hernia or abnormality. No abdominopelvic ascites. Musculoskeletal: No acute or significant osseous findings. IMPRESSION: 1. Acute interstitial edematous pancreatitis. Hypoattenuation in the groove between the pancreatic  head and the duodenum is favored to reflect inflammatory changes, however an underlying mass such as an acute peripancreatic fluid collection or underlying malignancy could have a similar appearance. Follow-up MR abdomen could be considered to document resolution of this finding after the patient's acute episode has resolved. 2. Dilated common bile duct. Aortic Atherosclerosis (ICD10-I70.0). Electronically Signed   By: Zerita Boers M.D.   On: 06/04/2020 12:13   MR 3D Recon At Scanner  Result Date: 06/05/2020 CLINICAL DATA:  Inpatient. Acute pancreatitis with indeterminate small low-attenuation focus along the pancreaticoduodenal groove on recent CT. EXAM: MRI ABDOMEN WITHOUT AND WITH CONTRAST TECHNIQUE: Multiplanar multisequence MR imaging of the abdomen was performed both before and  after the administration of intravenous contrast. CONTRAST:  6mL GADAVIST GADOBUTROL 1 MMOL/ML IV SOLN COMPARISON:  05/27/2020 CT abdomen/pelvis. FINDINGS: Lower chest: Mild bandlike scarring versus atelectasis at the dependent right lung base. Hepatobiliary: Normal liver size and configuration. Mild diffuse hepatic steatosis. Subcentimeter simple lateral segment left liver cyst. No suspicious liver masses. Nondistended gallbladder with no gallstones. No gallbladder wall thickening. Layering gallbladder sludge. No pericholecystic fluid. No intrahepatic biliary ductal dilatation. Dilated common bile duct with diameter 10 mm with sharp distal CBD tapering at the level of the pancreatic head. No evidence of choledocholithiasis. No discrete biliary or ampullary mass. No beading of the bile ducts. Pancreas: Diffuse peripancreatic and peripancreatic edema, most prominent in the body of the pancreas, compatible with acute pancreatitis. Diffuse main pancreatic duct dilation up to 5 mm diameter with irregular beaded appearance. No pancreas divisum. There is an indistinct 1.7 x 1.1 cm mildly T2 hyperintense focus along the pancreaticoduodenal groove (series 7/image 21) with surrounding edema with enhancement similar to the pancreatic parenchyma, favoring groove pancreatitis. Otherwise no discrete pancreatic mass. No measurable pancreatic or peripancreatic fluid collections. No evidence of pancreatic parenchymal hemorrhage and no evidence of nonenhancing pancreatic parenchyma to suggest necrotizing pancreatitis. Spleen: Normal size. No mass. Adrenals/Urinary Tract: Normal adrenals. No hydronephrosis. Normal kidneys with no renal mass. Stomach/Bowel: Normal non-distended stomach. Visualized small bowel is normal caliber, with no bowel wall thickening. Mild gas and layering fluid throughout the mildly distended visualized colon. Vascular/Lymphatic: Atherosclerotic nonaneurysmal abdominal aorta. Patent portal, splenic, hepatic  and renal veins. Mildly enlarged 1.2 cm peripancreatic lymph node (series 4/image 18). Other: No abdominal ascites or focal fluid collection. Musculoskeletal: No aggressive appearing focal osseous lesions. IMPRESSION: 1. Acute non-necrotizing pancreatitis. Ill-defined small mildly T2 hyperintense focus along the pancreaticoduodenal groove with surrounding edema and enhancement similar to the pancreatic parenchyma, favoring groove pancreatitis. Short-term follow-up MRI abdomen without and with IV contrast recommended in 1-3 months to document resolution. 2. Diffuse main pancreatic duct dilation up to 5 mm diameter with irregular beaded appearance, which may indicate underlying chronic pancreatitis. 3. Dilated common bile duct (10 mm diameter) with sharp distal CBD tapering at the level of the pancreatic head. No evidence of choledocholithiasis or discrete biliary or ampullary mass. Finding is nonspecific and may be due to mass effect from surrounding inflammation related to acute pancreatitis. This finding can also be reassessed on short-term follow-up MRI. ERCP could be obtained at this time as clinically warranted. 4. No measurable fluid collections. 5. Mild peripancreatic lymphadenopathy, nonspecific, favor reactive. 6. Mild diffuse hepatic steatosis. 7. Gallbladder sludge. No gallstones. Electronically Signed   By: Ilona Sorrel M.D.   On: 06/05/2020 11:08      Subjective: Pt reports that he has been tolerating his diet and his abdominal pain is better now and resolved.  He wants to go home.  He says he will try to stop alcohol drinking.  He agreeable to try librium as needed to help him stay off the bottle.   Discharge Exam: Vitals:   06/06/20 0444 06/06/20 0733  BP: 126/75   Pulse: 84   Resp:    Temp: 98.8 F (37.1 C)   SpO2: 96% 97%   Vitals:   06/05/20 2026 06/05/20 2129 06/06/20 0444 06/06/20 0733  BP:  105/89 126/75   Pulse:  89 84   Resp:      Temp:  97.9 F (36.6 C) 98.8 F (37.1 C)    TempSrc:  Oral Oral   SpO2: 100% 98% 96% 97%  Weight:      Height:       General: Pt is alert, awake, not in acute distress Cardiovascular: normal S1/S2 +, no rubs, no gallops Respiratory: CTA bilaterally, no wheezing, no rhonchi Abdominal: Soft, NT, ND, bowel sounds + Extremities: no edema, no cyanosis   The results of significant diagnostics from this hospitalization (including imaging, microbiology, ancillary and laboratory) are listed below for reference.     Microbiology: Recent Results (from the past 240 hour(s))  Respiratory Panel by RT PCR (Flu A&B, Covid) - Nasopharyngeal Swab     Status: None   Collection Time: 06/04/20 12:30 PM   Specimen: Nasopharyngeal Swab  Result Value Ref Range Status   SARS Coronavirus 2 by RT PCR NEGATIVE NEGATIVE Final    Comment: (NOTE) SARS-CoV-2 target nucleic acids are NOT DETECTED.  The SARS-CoV-2 RNA is generally detectable in upper respiratoy specimens during the acute phase of infection. The lowest concentration of SARS-CoV-2 viral copies this assay can detect is 131 copies/mL. A negative result does not preclude SARS-Cov-2 infection and should not be used as the sole basis for treatment or other patient management decisions. A negative result may occur with  improper specimen collection/handling, submission of specimen other than nasopharyngeal swab, presence of viral mutation(s) within the areas targeted by this assay, and inadequate number of viral copies (<131 copies/mL). A negative result must be combined with clinical observations, patient history, and epidemiological information. The expected result is Negative.  Fact Sheet for Patients:  PinkCheek.be  Fact Sheet for Healthcare Providers:  GravelBags.it  This test is no t yet approved or cleared by the [place] FDA and  has been authorized for detection and/or diagnosis of SARS-CoV-2 by FDA under an  Emergency Use Authorization (EUA). This EUA will remain  in effect (meaning this test can be used) for the duration of the COVID-19 declaration under Section 564(b)(1) of the Act, 21 U.S.C. section 360bbb-3(b)(1), unless the authorization is terminated or revoked sooner.     Influenza A by PCR NEGATIVE NEGATIVE Final   Influenza B by PCR NEGATIVE NEGATIVE Final    Comment: (NOTE) The Xpert Xpress SARS-CoV-2/FLU/RSV assay is intended as an aid in  the diagnosis of influenza from Nasopharyngeal swab specimens and  should not be used as a sole basis for treatment. Nasal washings and  aspirates are unacceptable for Xpert Xpress SARS-CoV-2/FLU/RSV  testing.  Fact Sheet for Patients: PinkCheek.be  Fact Sheet for Healthcare Providers: GravelBags.it  This test is not yet approved or cleared by the [place] FDA and  has been authorized for detection and/or diagnosis of SARS-CoV-2 by  FDA under an Emergency Use Authorization (EUA). This EUA will remain  in effect (meaning this test can be used) for the duration of the  Covid-19 declaration under Section  564(b)(1) of the Act, 21  U.S.C. section 360bbb-3(b)(1), unless the authorization is  terminated or revoked. Performed at [facility], [address], [place], [place] [address]      Labs: BNP (last 3 results) No results for input(s): BNP in the last 8760 hours. Basic Metabolic Panel: Recent Labs  Lab 06/04/20 0920 06/05/20 0548  NA 135 133*  K 3.1* 4.4  CL 88* 99  CO2 30 26  GLUCOSE 203* 106*  BUN 5* 5*  CREATININE 0.73 0.61  CALCIUM 9.4 8.8*  MG 2.6* 2.6*  PHOS  --  2.3*   Liver Function Tests: Recent Labs  Lab 06/04/20 0920 06/05/20 0548  AST 69* 28  ALT 32 20  ALKPHOS 88 63  BILITOT 1.1 1.1  PROT 8.6* 6.7  ALBUMIN 4.4 3.4*   Recent Labs  Lab 06/04/20 0920 06/05/20 0548  LIPASE 1,888* 120*   No results for input(s): AMMONIA in the last 168  hours. CBC: Recent Labs  Lab 06/04/20 0920 06/05/20 0548  WBC 10.9* 7.4  NEUTROABS  --  5.2  HGB 16.3 13.6  HCT 48.2 42.2  MCV 92.7 96.6  PLT 232 171   Cardiac Enzymes: No results for input(s): CKTOTAL, CKMB, CKMBINDEX, TROPONINI in the last 168 hours. BNP: Invalid input(s): POCBNP CBG: No results for input(s): GLUCAP in the last 168 hours. D-Dimer No results for input(s): DDIMER in the last 72 hours. Hgb A1c Recent Labs    06/04/20 0920  HGBA1C 5.5   Lipid Profile Recent Labs    06/05/20 0548  CHOL 165  HDL 59  LDLCALC 84  TRIG 111  CHOLHDL 2.8   Thyroid function studies No results for input(s): TSH, T4TOTAL, T3FREE, THYROIDAB in the last 72 hours.  Invalid input(s): FREET3 Anemia work up No results for input(s): VITAMINB12, FOLATE, FERRITIN, TIBC, IRON, RETICCTPCT in the last 72 hours. Urinalysis    Component Value Date/Time   COLORURINE YELLOW 06/04/2020 0920   APPEARANCEUR CLOUDY (A) 06/04/2020 0920   LABSPEC 1.015 06/04/2020 0920   PHURINE 9.0 (H) 06/04/2020 0920   GLUCOSEU 50 (A) 06/04/2020 0920   HGBUR NEGATIVE 06/04/2020 0920   BILIRUBINUR NEGATIVE 06/04/2020 0920   KETONESUR 20 (A) 06/04/2020 0920   PROTEINUR 100 (A) 06/04/2020 0920   UROBILINOGEN 0.2 10/28/2007 1435   NITRITE NEGATIVE 06/04/2020 0920   LEUKOCYTESUR NEGATIVE 06/04/2020 0920   Sepsis Labs Invalid input(s): PROCALCITONIN,  WBC,  LACTICIDVEN Microbiology Recent Results (from the past 240 hour(s))  Respiratory Panel by RT PCR (Flu A&B, Covid) - Nasopharyngeal Swab     Status: None   Collection Time: 06/04/20 12:30 PM   Specimen: Nasopharyngeal Swab  Result Value Ref Range Status   SARS Coronavirus 2 by RT PCR NEGATIVE NEGATIVE Final    Comment: (NOTE) SARS-CoV-2 target nucleic acids are NOT DETECTED.  The SARS-CoV-2 RNA is generally detectable in upper respiratoy specimens during the acute phase of infection. The lowest concentration of SARS-CoV-2 viral copies this assay  can detect is 131 copies/mL. A negative result does not preclude SARS-Cov-2 infection and should not be used as the sole basis for treatment or other patient management decisions. A negative result may occur with  improper specimen collection/handling, submission of specimen other than nasopharyngeal swab, presence of viral mutation(s) within the areas targeted by this assay, and inadequate number of viral copies (<131 copies/mL). A negative result must be combined with clinical observations, patient history, and epidemiological information. The expected result is Negative.  Fact Sheet for Patients:  PinkCheek.be  Fact Sheet for Healthcare Providers:  GravelBags.it  This test is no t yet approved or cleared by the [place] FDA and  has been authorized for detection and/or diagnosis of SARS-CoV-2 by FDA under an Emergency Use Authorization (EUA). This EUA will remain  in effect (meaning this test can be used) for the duration of the COVID-19 declaration under Section 564(b)(1) of the Act, 21 U.S.C. section 360bbb-3(b)(1), unless the authorization is terminated or revoked sooner.     Influenza A by PCR NEGATIVE NEGATIVE Final   Influenza B by PCR NEGATIVE NEGATIVE Final    Comment: (NOTE) The Xpert Xpress SARS-CoV-2/FLU/RSV assay is intended as an aid in  the diagnosis of influenza from Nasopharyngeal swab specimens and  should not be used as a sole basis for treatment. Nasal washings and  aspirates are unacceptable for Xpert Xpress SARS-CoV-2/FLU/RSV  testing.  Fact Sheet for Patients: PinkCheek.be  Fact Sheet for Healthcare Providers: GravelBags.it  This test is not yet approved or cleared by the [place] FDA and  has been authorized for detection and/or diagnosis of SARS-CoV-2 by  FDA under an Emergency Use Authorization (EUA). This EUA will remain   in effect (meaning this test can be used) for the duration of the  Covid-19 declaration under Section 564(b)(1) of the Act, 21  U.S.C. section 360bbb-3(b)(1), unless the authorization is  terminated or revoked. Performed at [facility], [address] [place], [place] [address]    Time coordinating discharge: 35 mins   SIGNED:  Irwin Brakeman, MD  [facility] 06/06/2020, 11:01 AM How to contact the [facility] Attending or Consulting provider [place] or covering provider during after hours [place], for this patient?  1. Check the care team in [facility] and look for a) attending/consulting TRH provider listed and b) the [facility] team listed 2. Log into [email or web address] and use [place]'s universal password to access. If you do not have the password, please contact the hospital operator. 3. Locate the [facility] provider you are looking for under [facility] and page to a number that you can be directly reached. 4. If you still have difficulty reaching the provider, please page the [facility] (Director on Call) for the Hospitalists listed on amion for assistance.

## 2020-06-06 NOTE — Discharge Instructions (Signed)
Please eat soft foods diet for next 2 weeks Please avoid all alcohol consumption Please have your doctor order another MRI abdomen in 3 months to follow up checking on your pancreas   Acute Pancreatitis  Acute pancreatitis happens when the pancreas gets swollen. The pancreas is a large gland in the body that helps to control blood sugar. It also makes enzymes that help to digest food. This condition can last a few days and cause serious problems. The lungs, heart, and kidneys may stop working. What are the causes? Causes include:  Alcohol abuse.  Drug abuse.  Gallstones.  A tumor in the pancreas. Other causes include:  Some medicines.  Some chemicals.  Diabetes.  An infection.  Damage caused by an accident.  The poison (venom) from a scorpion bite.  Belly (abdominal) surgery.  The body's defense system (immune system) attacking the pancreas (autoimmune pancreatitis).  Genes that are passed from parent to child (inherited). In some cases, the cause is not known. What are the signs or symptoms?  Pain in the upper belly that may be felt in the back. The pain may be very bad.  Swelling of the belly.  Feeling sick to your stomach (nauseous) and throwing up (vomiting).  Fever. How is this treated? You will likely have to stay in the hospital. Treatment may include:  Pain medicine.  Fluid through an IV tube.  Placing a tube in the stomach to take out the stomach contents. This may help you stop throwing up.  Not eating for 3-4 days.  Antibiotic medicines, if you have an infection.  Treating any other problems that may be the cause.  Steroid medicines, if your problem is caused by your defense system attacking your body's own tissues.  Surgery. Follow these instructions at home: Eating and drinking   Follow instructions from your doctor about what to eat and drink.  Eat foods that do not have a lot of fat in them.  Eat small meals often. Do not eat  big meals.  Drink enough fluid to keep your pee (urine) pale yellow.  Do not drink alcohol if it caused your condition. Medicines  Take over-the-counter and prescription medicines only as told by your doctor.  Ask your doctor if the medicine prescribed to you: ? Requires you to avoid driving or using heavy machinery. ? Can cause trouble pooping (constipation). You may need to take steps to prevent or treat trouble pooping:  Take over-the-counter or prescription medicines.  Eat foods that are high in fiber. These include beans, whole grains, and fresh fruits and vegetables.  Limit foods that are high in fat and sugar. These include fried or sweet foods. General instructions  Do not use any products that contain nicotine or tobacco, such as cigarettes, e-cigarettes, and chewing tobacco. If you need help quitting, ask your doctor.  Get plenty of rest.  Check your blood sugar at home as told by your doctor.  Keep all follow-up visits as told by your doctor. This is important. Contact a doctor if:  You do not get better as quickly as expected.  You have new symptoms.  Your symptoms get worse.  You have pain or weakness that lasts a long time.  You keep feeling sick to your stomach.  You get better and then you have pain again.  You have a fever. Get help right away if:  You cannot eat or keep fluids down.  Your pain gets very bad.  Your skin or the white part  of your eyes turns yellow.  You have sudden swelling in your belly.  You throw up.  You feel dizzy or you pass out (faint).  Your blood sugar is high (over 300 mg/dL). Summary  Acute pancreatitis happens when the pancreas gets swollen.  This condition is often caused by alcohol abuse, drug abuse, or gallstones.  You will likely have to stay in the hospital for treatment. This information is not intended to replace advice given to you by your health care provider. Make sure you discuss any questions you  have with your health care provider. Document Revised: 05/14/2018 Document Reviewed: 05/14/2018 Elsevier Patient Education  [place].   Chronic Pancreatitis  Chronic pancreatitis is long-lasting inflammation and scarring of the pancreas. The pancreas is a gland that is located behind the stomach. It makes enzymes that help to digest food. The pancreas also releases hormones called glucagon and insulin, which help regulate blood sugar (glucose). Damage to the pancreas may affect digestion, cause pain in the upper abdomen and back, and cause diabetes. Inflammation can also irritate other organs in the abdomen near the pancreas. At first, pancreatitis may be sudden (acute). If you have several or prolonged episodes of acute pancreatitis, the condition can turn into chronic pancreatitis. What are the causes? The most common cause of this condition is alcohol abuse. Other causes include:  High (elevated) levels of triglycerides in the blood (hypertriglyceridemia).  Gallstones or other conditions that can block the tube that drains the pancreas (pancreatic duct).  Pancreatic cancer.  Cystic fibrosis.  Too much calcium in the blood (hypercalcemia), which may be caused by an overactive parathyroid gland (hyperparathyroidism).  Certain medicines.  Injury to the pancreas.  Infection.  Autoimmune pancreatitis. This is when the body's disease-fighting (immune) system attacks the pancreas.  Genes that are passed from parent to child (inherited). In some cases, the cause may not be known. What increases the risk? This condition is more likely to develop in:  Men.  People who are 39-57 years old.  People who have a family history of pancreatitis.  People who smoke tobacco.  People who drink large amounts of alcohol over a long period of time. What are the signs or symptoms? Symptoms of this condition may include:  Pain in the abdomen or upper back. Pain may get worse after  eating.  Nausea and vomiting.  Fever.  Weight loss.  A change in the color and consistency of bowel movements, such as stools that are oily, fatty, or clay-colored. How is this diagnosed? This condition is diagnosed based on your symptoms, your medical history, and a physical exam. You may have tests, such as:  Blood tests.  Stool samples.  Biopsy of the pancreas. This is the removal of a small amount of pancreas tissue to be tested in a lab.  Imaging tests, such as: ? X-rays. ? CT scan. ? MRI. ? Ultrasound. How is this treated? You may need to be treated at a hospital. Treatment may involve:  Resting the pancreas. You may need to stop eating and drinking for a few days to give your pancreas time to recover. During this time, you will be given IV fluids to keep you hydrated.  Controlling pain. You may be given pain medicines by mouth (orally) or as injections.  Improving digestion. You may be given: ? Medicines to replace your pancreatic enzymes. ? Vitamin supplements. ? A specific diet to follow. You may work with a diet and nutrition specialist (dietitian) to make  an eating plan.  Surgery to: ? Clear the pancreatic ducts of any blockages, such as gallstones. ? Remove any fluid or damaged tissue from the pancreas. Other treatments may include:  Preventing diabetes. Your health care provider may recommend that you: ? Get regular screening tests for diabetes. ? Monitor your blood glucose regularly.  Lifestyle changes, such as stopping alcohol use.  Steroid medicines, if your condition is caused by your immune system attacking your body's own tissues (autoimmune disease). Follow these instructions at home: Eating and drinking      Do not drink alcohol. If you need help quitting, ask your health care provider.  Follow a diet as told by your health care provider or dietitian, if this applies. This may include: ? Limiting how much fat you eat. ? Eating smaller  meals more often. ? Avoiding caffeine.  Drink enough fluid to keep your urine pale yellow. General instructions  Take over-the-counter and prescription medicines only as told by your health care provider. These include vitamin supplements.  Do not drive or use heavy machinery while taking prescription pain medicine.  If you are taking prescription pain medicine, take actions to prevent or treat constipation. Your health care provider may recommend that you: ? Take an over-the-counter or prescription medicine for constipation. ? Eat foods that are high in fiber such as whole grains and beans. ? Limit foods that are high in fat and processed sugars, such as fried or sweet foods.  Do not use any products that contain nicotine or tobacco, such as cigarettes and e-cigarettes. If you need help quitting, ask your health care provider.  If recommended by your health care provider, monitor your blood glucose at home.  Keep all follow-up visits as told by your health care provider. This is important. Contact a health care provider if:  You have pain that does not get better with medicine.  You have a fever.  You have sudden weight loss. Get help right away if:  Your pain suddenly gets worse.  You have sudden swelling in your abdomen.  You start to vomit often.  You vomit blood.  You have diarrhea that does not go away.  You have blood in your stool.  You become confused or you have trouble thinking clearly. Summary  Chronic pancreatitis is long-lasting inflammation and scarring of the pancreas. Damage to the pancreas may affect digestion, cause pain in the upper abdomen and back, and cause diabetes. Inflammation can also irritate other organs in the abdomen near the pancreas.  Common causes of this condition are alcohol abuse, gallstones, high (elevated) levels of triglycerides, and certain medicines.  This condition is sometimes treated at a hospital and may involve resting the  pancreas, controlling pain, replacing enzymes, and avoiding alcohol. This information is not intended to replace advice given to you by your health care provider. Make sure you discuss any questions you have with your health care provider. Document Revised: 02/28/2019 Document Reviewed: 03/24/2017 Elsevier Patient Education  [place].   IMPORTANT INFORMATION: PAY CLOSE ATTENTION   PHYSICIAN DISCHARGE INSTRUCTIONS  Follow with Primary care provider  Rosita Fire, MD  and other consultants as instructed by your Hospitalist Physician  [place], WORSEN OR NEW PROBLEM DEVELOPS   Please note: You were cared for by a hospitalist during your hospital stay. Every effort will be made to forward records to your primary care provider.  You can request that your primary care  provider send for your hospital records if they have not received them.  Once you are discharged, your primary care physician will handle any further medical issues. Please note that NO REFILLS for any discharge medications will be authorized once you are discharged, as it is imperative that you return to your primary care physician (or establish a relationship with a primary care physician if you do not have one) for your post hospital discharge needs so that they can reassess your need for medications and monitor your lab values.  Please get a complete blood count and chemistry panel checked by your Primary MD at your next visit, and again as instructed by your Primary MD.  Get Medicines reviewed and adjusted: Please take all your medications with you for your next visit with your Primary MD  Laboratory/radiological data: Please request your Primary MD to go over all hospital tests and procedure/radiological results at the follow up, please ask your primary care provider to get all Hospital records sent to his/her office.  In some cases, they will be blood work,  cultures and biopsy results pending at the time of your discharge. Please request that your primary care provider follow up on these results.  If you are diabetic, please bring your blood sugar readings with you to your follow up appointment with primary care.    Please call and make your follow up appointments as soon as possible.    Also Note the following: If you experience worsening of your admission symptoms, develop shortness of breath, life threatening emergency, suicidal or homicidal thoughts you must seek medical attention immediately by calling 911 or calling your MD immediately  if symptoms less severe.  You must read complete instructions/literature along with all the possible adverse reactions/side effects for all the Medicines you take and that have been prescribed to you. Take any new Medicines after you have completely understood and accpet all the possible adverse reactions/side effects.   Do not drive when taking Pain medications or sleeping medications (Benzodiazepines)  Do not take more than prescribed Pain, Sleep and Anxiety Medications. It is not advisable to combine anxiety,sleep and pain medications without talking with your primary care practitioner  Special Instructions: If you have smoked or chewed Tobacco  in the last 2 yrs please stop smoking, stop any regular Alcohol  and or any Recreational drug use.  Wear Seat belts while driving.  Do not drive if taking any narcotic, mind altering or controlled substances or recreational drugs or alcohol.

## 2020-06-06 NOTE — Plan of Care (Signed)
  Problem: Education: Goal: Knowledge of General Education information will improve Description: Including pain rating scale, medication(s)/side effects and non-pharmacologic comfort measures 06/06/2020 1300 by Santa Lighter, RN Outcome: Adequate for Discharge 06/06/2020 1300 by Santa Lighter, RN Outcome: Progressing   Problem: Health Behavior/Discharge Planning: Goal: Ability to manage health-related needs will improve 06/06/2020 1300 by Santa Lighter, RN Outcome: Adequate for Discharge 06/06/2020 1300 by Santa Lighter, RN Outcome: Progressing   Problem: Clinical Measurements: Goal: Ability to maintain clinical measurements within normal limits will improve 06/06/2020 1300 by Santa Lighter, RN Outcome: Adequate for Discharge 06/06/2020 1300 by Santa Lighter, RN Outcome: Progressing Goal: Will remain free from infection Outcome: Adequate for Discharge Goal: Diagnostic test results will improve Outcome: Adequate for Discharge Goal: Respiratory complications will improve Outcome: Adequate for Discharge Goal: Cardiovascular complication will be avoided Outcome: Adequate for Discharge   Problem: Activity: Goal: Risk for activity intolerance will decrease Outcome: Adequate for Discharge   Problem: Nutrition: Goal: Adequate nutrition will be maintained Outcome: Adequate for Discharge   Problem: Coping: Goal: Level of anxiety will decrease Outcome: Adequate for Discharge   Problem: Elimination: Goal: Will not experience complications related to bowel motility Outcome: Adequate for Discharge Goal: Will not experience complications related to urinary retention Outcome: Adequate for Discharge   Problem: Pain Managment: Goal: General experience of comfort will improve Outcome: Adequate for Discharge   Problem: Safety: Goal: Ability to remain free from injury will improve Outcome: Adequate for Discharge   Problem: Skin Integrity: Goal: Risk for impaired skin  integrity will decrease Outcome: Adequate for Discharge

## 2020-06-08 ENCOUNTER — Other Ambulatory Visit: Payer: Self-pay | Admitting: Family Medicine

## 2020-06-08 ENCOUNTER — Other Ambulatory Visit: Payer: Self-pay

## 2020-06-08 ENCOUNTER — Encounter (INDEPENDENT_AMBULATORY_CARE_PROVIDER_SITE_OTHER): Payer: Self-pay | Admitting: *Deleted

## 2020-06-08 DIAGNOSIS — K852 Alcohol induced acute pancreatitis without necrosis or infection: Secondary | ICD-10-CM

## 2020-06-08 NOTE — Patient Outreach (Signed)
[place] [facility]) Care Management  06/08/2020  SAVALAS MONJE [birth year] [id number]     Transition of Care Referral  Referral Date: 06/08/2020 Referral Source: [facility] Discharge Report Date of Discharge: 06/06/2020 Facility: [facility] Insurance: [facility]    Referral received. Transition of care calls being completed via EMMI-automated calls. RN CM will outreach patient for any red flags received.     Plan: RN CM will close case at this time.    [name], RN,BSN,CCM [place] Management Telephonic Care Management Coordinator Direct Phone: [phone] Toll Free: [phone] Fax: [phone]

## 2020-06-09 ENCOUNTER — Other Ambulatory Visit: Payer: Self-pay

## 2020-06-09 NOTE — Patient Outreach (Signed)
[place] [facility]) Care Management  06/09/2020  Bryan Fleming Nov 16, 1960 [id number]    EMMI-General Discharge RED ON EMMI ALERT Day # 1 Date: 06/08/2020 Red Alert Reason: "Scheduled follow-up? No Unfilled prescriptions? Yes"   Outreach attempt # 1 to patient.  No answer. RN CM left HIPAA compliant voicemail message along with contact info.     Plan: RN CM will make outreach attempt to patient within 3-4 business days. RN CM will send unsuccessful outreach letter to patient.  [name], RN,BSN,CCM [place] Management Telephonic Care Management Coordinator Direct Phone: [phone] Toll Free: [phone] Fax: [phone]

## 2020-06-10 ENCOUNTER — Other Ambulatory Visit: Payer: Self-pay

## 2020-06-10 NOTE — Patient Outreach (Signed)
[place] [facility]) Care Management  06/10/2020  Bryan Fleming [birth year] [id number]    EMMI-General Discharge RED ON EMMI ALERT Day # 1 Date: 06/08/2020 Red Alert Reason: "Scheduled follow-up? No Unfilled prescriptions? Yes"   Outreach attempt #2 to patient. No answer after multiple rings.     Plan: RN CM will make outreach attempt to patient within 3-4 business days.  [name], RN,BSN,CCM [name] Management Telephonic Care Management Coordinator Direct Phone: [phone] Toll Free: [phone] Fax: [phone]

## 2020-06-11 ENCOUNTER — Other Ambulatory Visit: Payer: Self-pay

## 2020-06-11 NOTE — Patient Outreach (Signed)
[place] [facility]) Care Management  06/11/2020  Bryan Fleming 1961-05-24 [id number]   EMMI-General Discharge RED ON EMMI ALERT Day #1 Date:06/08/2020 Red Alert Reason:"Scheduled follow-up? No Unfilled prescriptions? Yes"   Unsuccessful outreach attempt to patient.     Plan: RN CM will make outreach attempt to patient within 3-4 wks if no response from letter mailed to patient.  [name], RN,BSN,CCM [place] Management Telephonic Care Management Coordinator Direct Phone: [phone] Toll Free: [phone] Fax: [phone]

## 2020-06-12 DIAGNOSIS — Z23 Encounter for immunization: Secondary | ICD-10-CM | POA: Diagnosis not present

## 2020-06-12 DIAGNOSIS — J449 Chronic obstructive pulmonary disease, unspecified: Secondary | ICD-10-CM | POA: Diagnosis not present

## 2020-06-12 DIAGNOSIS — K219 Gastro-esophageal reflux disease without esophagitis: Secondary | ICD-10-CM | POA: Diagnosis not present

## 2020-06-12 DIAGNOSIS — Z7289 Other problems related to lifestyle: Secondary | ICD-10-CM | POA: Diagnosis not present

## 2020-06-12 DIAGNOSIS — K861 Other chronic pancreatitis: Secondary | ICD-10-CM | POA: Diagnosis not present

## 2020-06-26 ENCOUNTER — Other Ambulatory Visit (HOSPITAL_COMMUNITY): Payer: Self-pay | Admitting: Gerontology

## 2020-06-26 ENCOUNTER — Other Ambulatory Visit: Payer: Self-pay | Admitting: Gerontology

## 2020-06-26 DIAGNOSIS — K861 Other chronic pancreatitis: Secondary | ICD-10-CM

## 2020-07-06 ENCOUNTER — Other Ambulatory Visit: Payer: Self-pay

## 2020-07-06 NOTE — Patient Outreach (Signed)
[place] [facility]) Care Management  07/06/2020  CLEVELAND YARBRO September 06, 1960 [id number]    EMMI-General Discharge RED ON EMMI ALERT Day #1 Date:06/08/2020 Red Alert Reason:"Scheduled follow-up? No Unfilled prescriptions? Yes"   Transition of Care Referral  Referral Date: 06/08/2020 Referral Source: [employer] Discharge Report Date of Discharge: 06/06/2020 Facility: [place]: [facility]   Multiple attempts to establish contact with patient without success. No response from letter mailed to patient. Case is being closed at this time.    Plan: RN CM will close case at this time.   [name], RN,BSN,CCM [place] Management Telephonic Care Management Coordinator Direct Phone: [phone] Toll Free: [phone] Fax: [phone]

## 2020-07-08 ENCOUNTER — Telehealth (INDEPENDENT_AMBULATORY_CARE_PROVIDER_SITE_OTHER): Payer: Self-pay

## 2020-07-08 ENCOUNTER — Ambulatory Visit (INDEPENDENT_AMBULATORY_CARE_PROVIDER_SITE_OTHER): Payer: Medicare HMO | Admitting: Gastroenterology

## 2020-07-08 NOTE — Telephone Encounter (Signed)
Patient no showed for his appointment on 07/08/2020 with Dr. Jenetta Downer.

## 2020-07-08 NOTE — Telephone Encounter (Signed)
Noted  

## 2020-07-12 DIAGNOSIS — I10 Essential (primary) hypertension: Secondary | ICD-10-CM | POA: Diagnosis not present

## 2020-07-12 DIAGNOSIS — K219 Gastro-esophageal reflux disease without esophagitis: Secondary | ICD-10-CM | POA: Diagnosis not present

## 2020-08-12 DIAGNOSIS — J449 Chronic obstructive pulmonary disease, unspecified: Secondary | ICD-10-CM | POA: Diagnosis not present

## 2020-08-12 DIAGNOSIS — I10 Essential (primary) hypertension: Secondary | ICD-10-CM | POA: Diagnosis not present

## 2020-09-03 ENCOUNTER — Ambulatory Visit (HOSPITAL_COMMUNITY): Payer: Medicare HMO

## 2020-09-15 ENCOUNTER — Ambulatory Visit (HOSPITAL_COMMUNITY): Payer: Medicare HMO

## 2020-09-16 DIAGNOSIS — K219 Gastro-esophageal reflux disease without esophagitis: Secondary | ICD-10-CM | POA: Diagnosis not present

## 2020-09-16 DIAGNOSIS — Z0001 Encounter for general adult medical examination with abnormal findings: Secondary | ICD-10-CM | POA: Diagnosis not present

## 2020-09-16 DIAGNOSIS — E785 Hyperlipidemia, unspecified: Secondary | ICD-10-CM | POA: Diagnosis not present

## 2020-09-16 DIAGNOSIS — Z1331 Encounter for screening for depression: Secondary | ICD-10-CM | POA: Diagnosis not present

## 2020-09-16 DIAGNOSIS — F17210 Nicotine dependence, cigarettes, uncomplicated: Secondary | ICD-10-CM | POA: Diagnosis not present

## 2020-09-16 DIAGNOSIS — Z1389 Encounter for screening for other disorder: Secondary | ICD-10-CM | POA: Diagnosis not present

## 2020-09-16 DIAGNOSIS — I10 Essential (primary) hypertension: Secondary | ICD-10-CM | POA: Diagnosis not present

## 2020-09-16 DIAGNOSIS — J441 Chronic obstructive pulmonary disease with (acute) exacerbation: Secondary | ICD-10-CM | POA: Diagnosis not present

## 2020-09-25 ENCOUNTER — Ambulatory Visit (HOSPITAL_COMMUNITY)
Admission: RE | Admit: 2020-09-25 | Discharge: 2020-09-25 | Disposition: A | Discharge status: Home / Self Care | Payer: Medicare HMO | Source: Ambulatory Visit | Attending: Gerontology | Admitting: Gerontology

## 2020-09-25 ENCOUNTER — Other Ambulatory Visit (HOSPITAL_COMMUNITY): Payer: Self-pay | Admitting: Gerontology

## 2020-09-25 ENCOUNTER — Other Ambulatory Visit: Payer: Self-pay

## 2020-09-25 DIAGNOSIS — K861 Other chronic pancreatitis: Secondary | ICD-10-CM | POA: Insufficient documentation

## 2020-09-25 IMAGING — MR MR ABDOMEN WO/W CM
20 of 21 series · 45 of 48 positions shown · IV contrast (7 ml Gadavist)
Comparison: [DATE]

CLINICAL DATA: Chronic pancreatitis, follow-up prior MR

EXAM:
MRI ABDOMEN WITHOUT AND WITH CONTRAST
TECHNIQUE: Multiplanar multisequence MR imaging of the abdomen was performed
both before and after the administration of intravenous contrast.
CONTRAST:  7mL GADAVIST GADOBUTROL 1 MMOL/ML IV SOLN

[Series 5: cor haste · coronal · 6.0mm · 1.19mm/px · 1 of 30 slices shown]
[im 1/30]
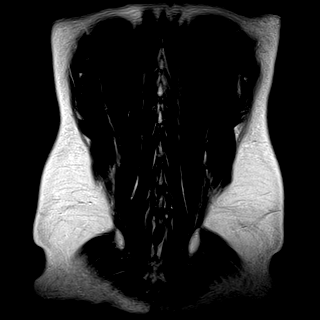

[Series 7: T2 fat-sat · axial · 6.0mm · 1.19mm/px · 1 of 30 slices shown]
[im 1/30]
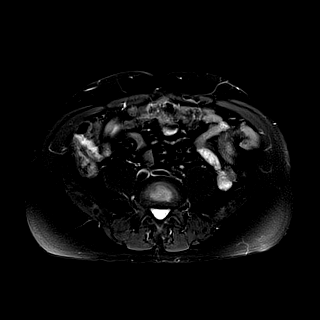

[Series 8: DWI · axial · 6.0mm · 1.42mm/px · 1 of 30 slices shown (1 of 4)]
[im 1/30]
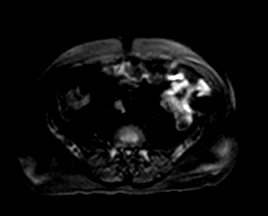

[Series 8: DWI · axial · 6.0mm · 1.42mm/px · 1 of 30 slices shown (2 of 4)]
[im 1/30]
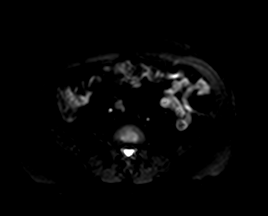

[Series 8: DWI · axial · 6.0mm · 1.42mm/px · 1 of 30 slices shown (3 of 4)]
[im 1/30]
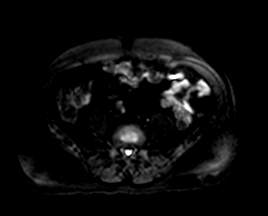

[Series 9: DWI · axial · 6.0mm · 1.42mm/px · 1 of 30 slices shown (4 of 4)]
[im 1/30]
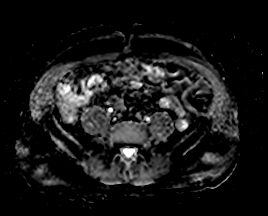

[Series 10: ax in and · axial · 3.0mm · 1.19mm/px · z∈[-170,+19]mm · 3 of 64 slices shown (1 of 2)]
[im 1/64]
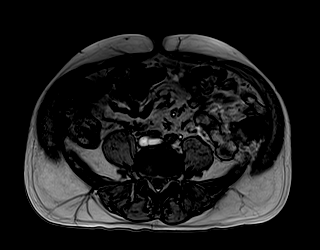
[im 32/64]
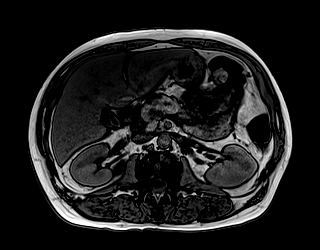
[im 64/64]
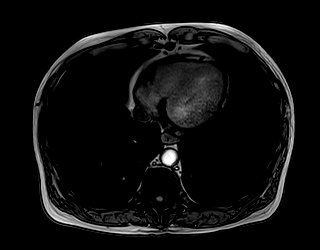

[Series 11: ax in and · axial · 3.0mm · 1.19mm/px · z∈[-170,+19]mm · 3 of 64 slices shown (2 of 2)]
[im 1/64]
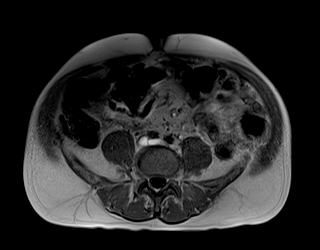
[im 32/64]
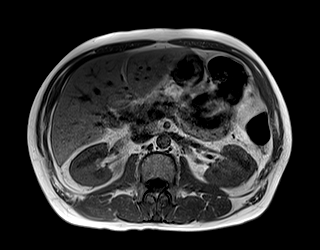
[im 64/64]
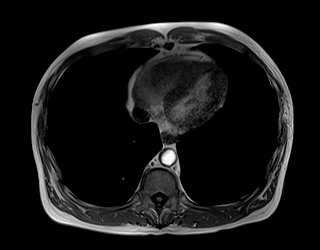

[Series 17: bSSFP · axial · 6.0mm · 0.74mm/px · z∈[-176,+34]mm · 2 of 36 slices shown]
[im 1/36]
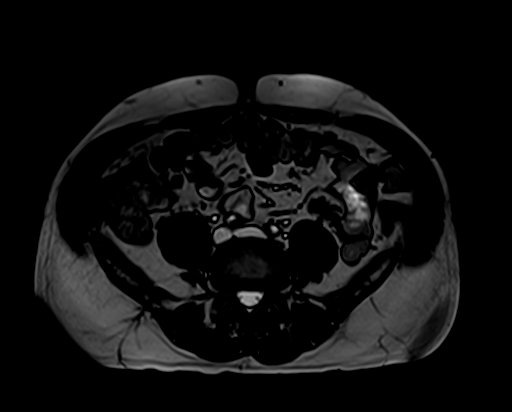
[im 36/36]
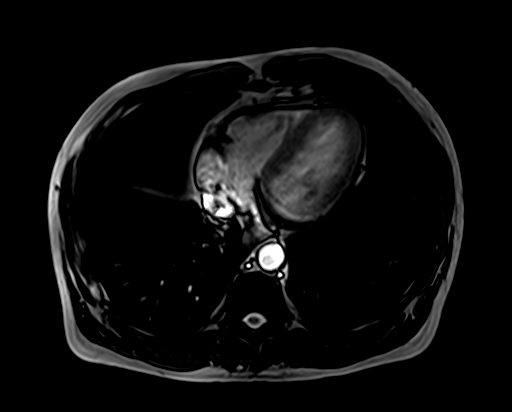

[Series 18: T1 dynamic · axial · non-contrast · 3.0mm · 1.19mm/px · z∈[-167,+22]mm · 3 of 64 slices shown (1 of 4)]
[im 1/64]
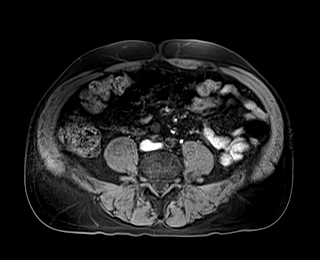
[im 32/64]
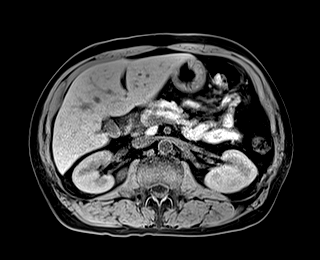
[im 64/64]
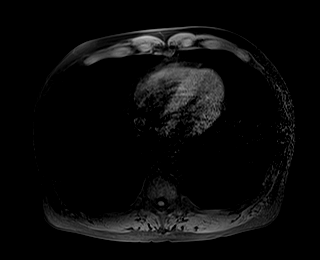

[Series 20: T1 dynamic post-contrast · axial · 3.0mm · 1.19mm/px · z∈[-167,+22]mm · 3 of 64 slices shown (1 of 6)]
[im 1/64]
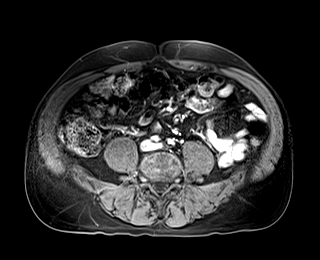
[im 32/64]
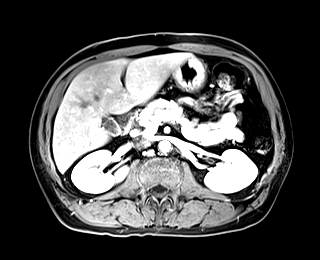
[im 64/64]
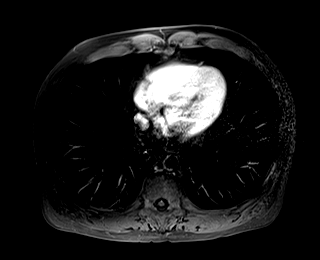

[Series 21: T1 dynamic · axial · 3.0mm · 1.19mm/px · z∈[-167,+22]mm · 3 of 64 slices shown (2 of 4)]
[im 1/64]
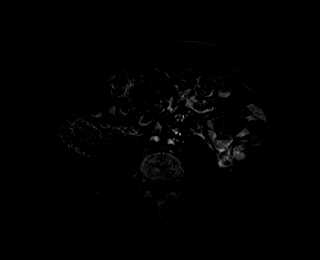
[im 32/64]
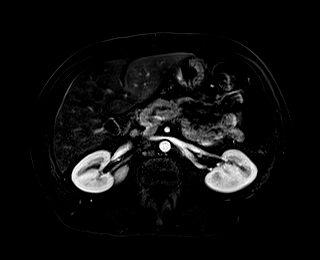
[im 64/64]
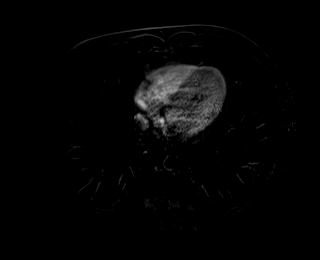

[Series 22: T1 dynamic post-contrast · axial · 3.0mm · 1.19mm/px · z∈[-167,+22]mm · 3 of 64 slices shown (2 of 6)]
[im 1/64]
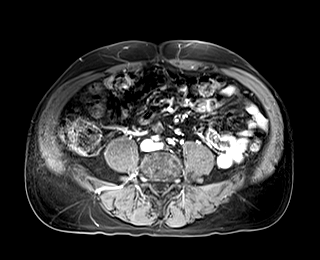
[im 32/64]
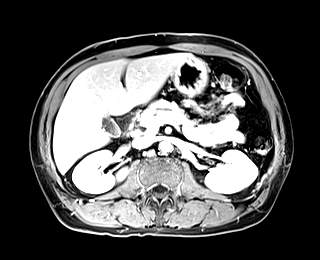
[im 64/64]
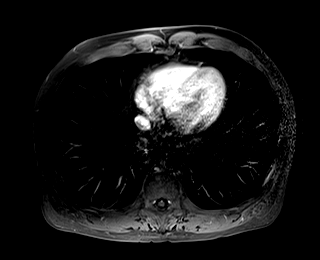

[Series 23: T1 dynamic · axial · 3.0mm · 1.19mm/px · z∈[-167,+22]mm · 3 of 64 slices shown (3 of 4)]
[im 1/64]
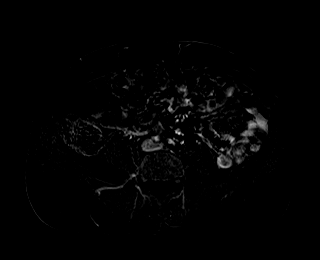
[im 32/64]
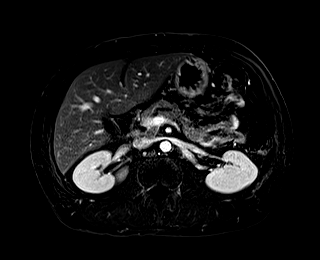
[im 64/64]
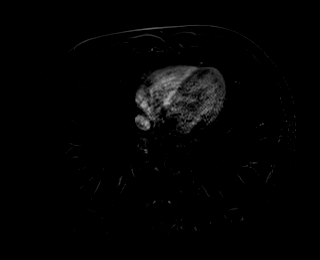

[Series 24: T1 dynamic post-contrast · axial · 3.0mm · 1.19mm/px · z∈[-167,+22]mm · 3 of 64 slices shown (3 of 6)]
[im 1/64]
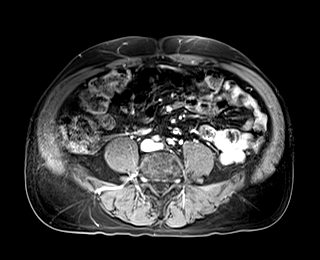
[im 32/64]
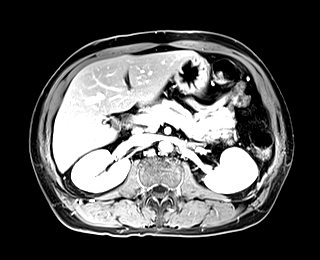
[im 64/64]
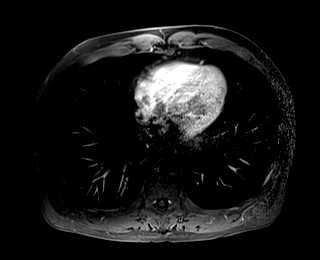

[Series 25: T1 dynamic · axial · 3.0mm · 1.19mm/px · z∈[-167,+22]mm · 3 of 64 slices shown (4 of 4)]
[im 1/64]
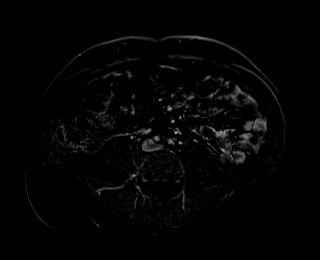
[im 32/64]
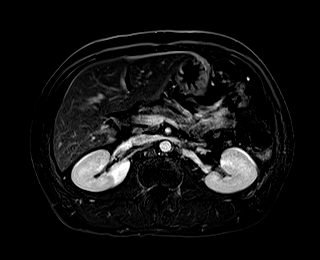
[im 64/64]
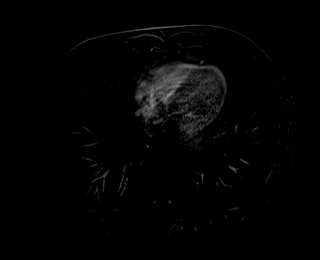

[Series 26: T1 dynamic post-contrast · coronal · 3.0mm · 1.31mm/px · 3 of 72 slices shown (4 of 6)]
[im 1/72]
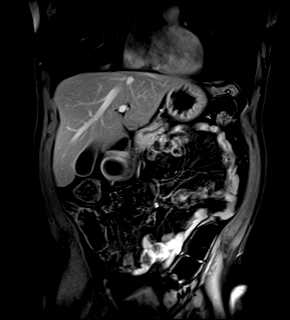
[im 36/72]
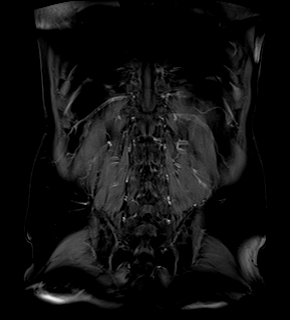
[im 72/72]
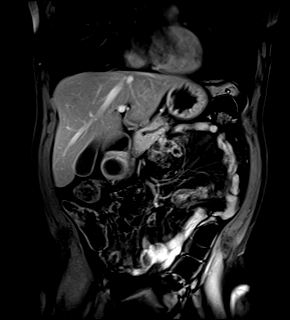

[Series 27: ax haste · axial · 6.0mm · 1.19mm/px · 1 of 28 slices shown]
[im 1/28]
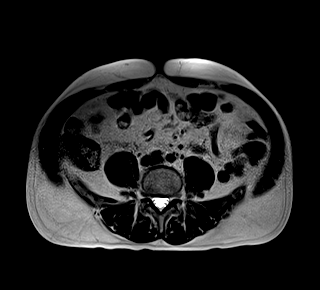

[Series 28: T1 dynamic post-contrast · axial · delayed · 3.0mm · 1.19mm/px · z∈[-167,+22]mm · 3 of 64 slices shown (5 of 6)]
[im 1/64]
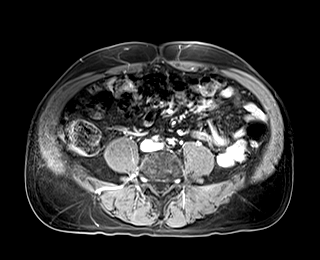
[im 32/64]
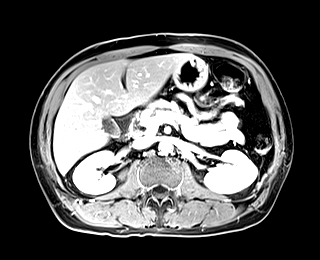
[im 64/64]
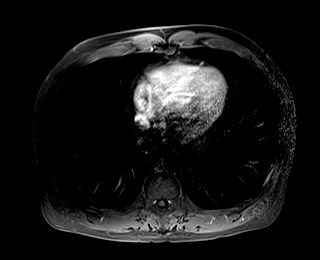

[Series 29: T1 dynamic post-contrast · axial · delayed · 3.0mm · 1.19mm/px · z∈[-167,+22]mm · 3 of 64 slices shown (6 of 6)]
[im 1/64]
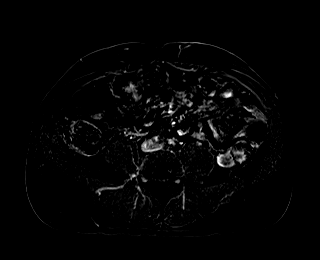
[im 32/64]
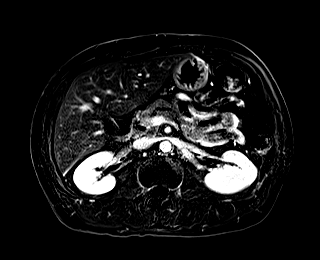
[im 64/64]
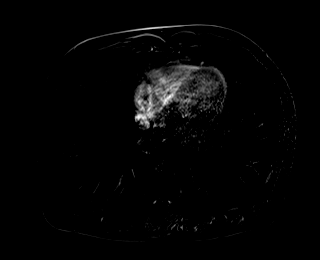

[45 of 48 positions shown; findings below may reference images not displayed]

FINDINGS: Lower chest: No acute findings.

Hepatobiliary: No mass or other parenchymal abnormality identified.
No biliary ductal dilatation

Pancreas: No acute inflammatory findings. Unchanged diffuse
prominence of the pancreatic duct to the ampulla, measuring up to 5
mm in caliber.

Spleen:  Within normal limits in size and appearance.

Adrenals/Urinary Tract: No masses identified. No evidence of
hydronephrosis.

Stomach/Bowel: Visualized portions within the abdomen are
unremarkable.

Vascular/Lymphatic: No pathologically enlarged lymph nodes
identified. No abdominal aortic aneurysm demonstrated. Aortic
atherosclerosis.

Other:  None.

Musculoskeletal: No suspicious bone lesions identified.
IMPRESSION: 1. Unchanged diffuse prominence of the pancreatic duct to the
ampulla, measuring up to 5 mm in caliber, generally suggesting
stigmata of chronic/recurrent pancreatitis. No discrete pancreatic
mass or other acute findings. No acute inflammatory findings.
2. Interval resolution of previously seen biliary ductal dilatation

Aortic Atherosclerosis ([K9]-[K9]).

## 2020-09-25 MED ORDER — GADOBUTROL 1 MMOL/ML IV SOLN
7.0000 mL | Freq: Once | INTRAVENOUS | Status: AC | PRN
  Administered 2020-09-25: 7 mL via INTRAVENOUS

## 2020-10-01 ENCOUNTER — Encounter (INDEPENDENT_AMBULATORY_CARE_PROVIDER_SITE_OTHER): Payer: Self-pay | Admitting: *Deleted

## 2020-10-05 ENCOUNTER — Other Ambulatory Visit (HOSPITAL_COMMUNITY): Payer: Self-pay | Admitting: Gerontology

## 2020-10-05 DIAGNOSIS — Z87891 Personal history of nicotine dependence: Secondary | ICD-10-CM

## 2020-10-14 DIAGNOSIS — I10 Essential (primary) hypertension: Secondary | ICD-10-CM | POA: Diagnosis not present

## 2020-10-14 DIAGNOSIS — J449 Chronic obstructive pulmonary disease, unspecified: Secondary | ICD-10-CM | POA: Diagnosis not present

## 2020-10-16 ENCOUNTER — Ambulatory Visit (HOSPITAL_COMMUNITY)
Admission: RE | Admit: 2020-10-16 | Discharge: 2020-10-16 | Disposition: A | Discharge status: Home / Self Care | Payer: Medicare HMO | Source: Ambulatory Visit | Attending: Gerontology | Admitting: Gerontology

## 2020-10-16 ENCOUNTER — Other Ambulatory Visit: Payer: Self-pay

## 2020-10-16 DIAGNOSIS — Z87891 Personal history of nicotine dependence: Secondary | ICD-10-CM | POA: Insufficient documentation

## 2020-10-16 DIAGNOSIS — F17210 Nicotine dependence, cigarettes, uncomplicated: Secondary | ICD-10-CM | POA: Diagnosis not present

## 2020-10-16 IMAGING — CT CT CHEST LUNG CANCER SCREENING LOW DOSE W/O CM
2 of 4 series · 15 of 40 positions shown, 18 images · non-contrast
Comparison: CT chest dated [DATE]. Correlation with CT
abdomen/pelvis dated [DATE].

CLINICAL DATA: 59-year-old male current smoker, with 25 pack-year
history of smoking, for initial lung cancer screening

EXAM:
CT CHEST WITHOUT CONTRAST LOW-DOSE FOR LUNG CANCER SCREENING
TECHNIQUE: Multidetector CT imaging of the chest was performed following the
standard protocol without IV contrast.

[Series 2: axial st · axial · 0.63mm/px · z∈[+1265,+1520]mm · 12 of 61 slices shown, 15 images]
[im 5/61  mediastinal]
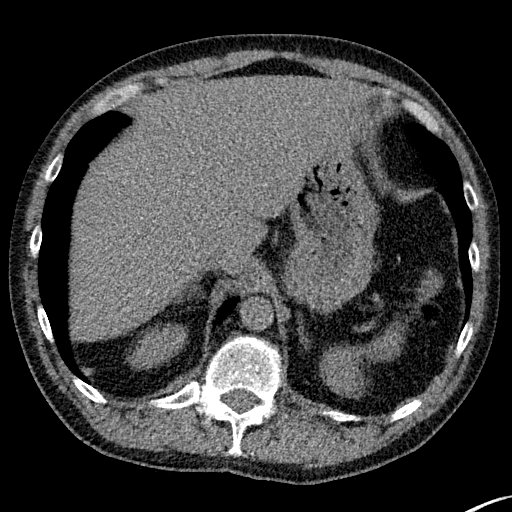
[im 5/61  lung]
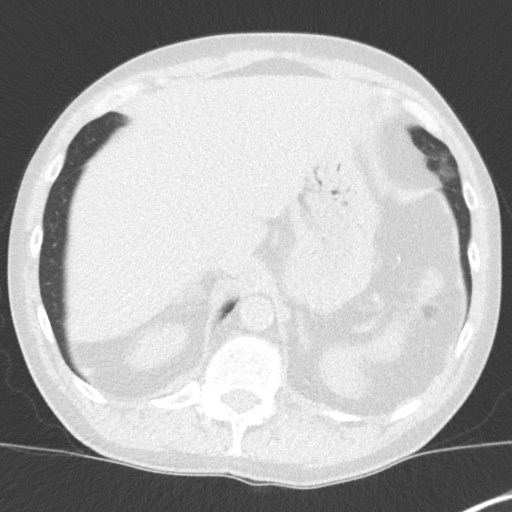
[im 10/61  lung]
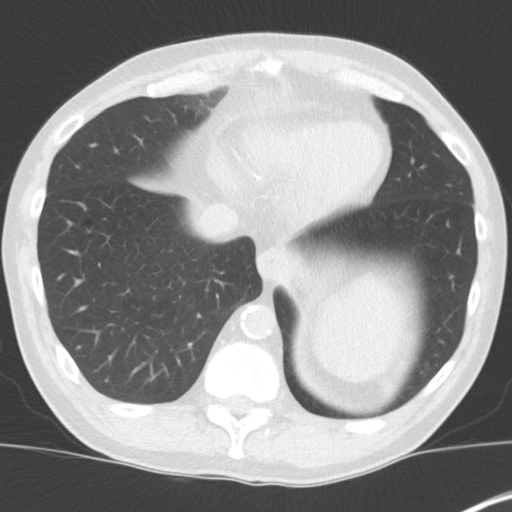
[im 14/61  lung]
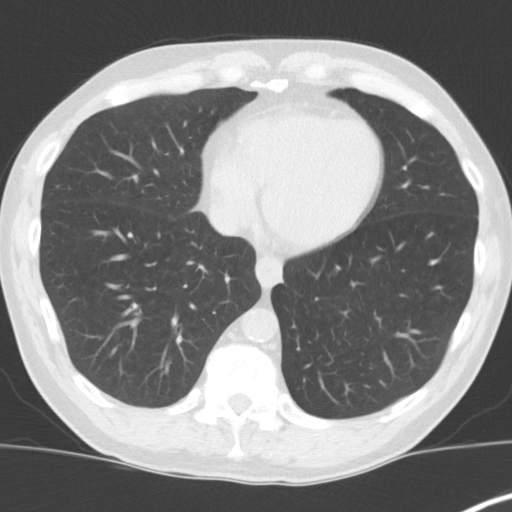
[im 19/61  lung]
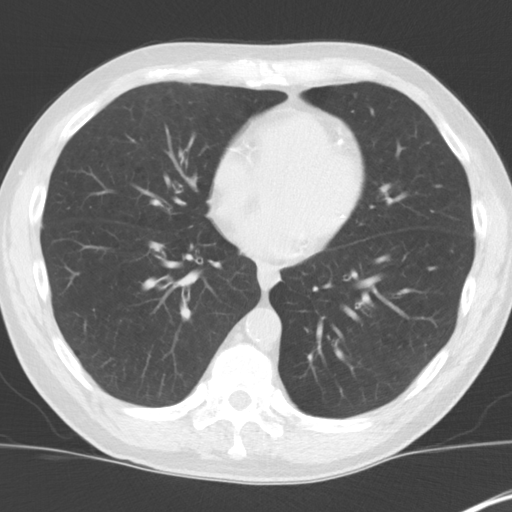
[im 24/61  mediastinal]
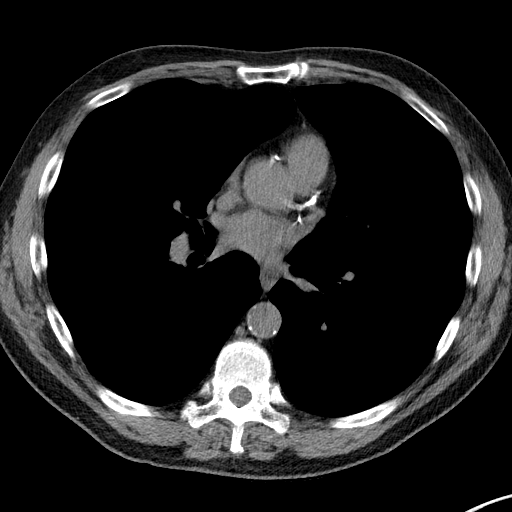
[im 24/61  lung]
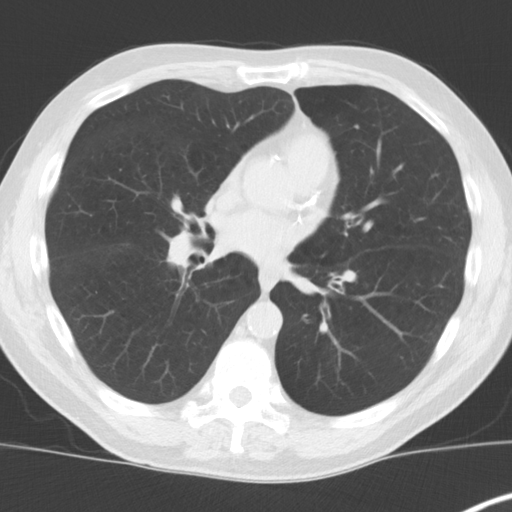
[im 28/61  lung]
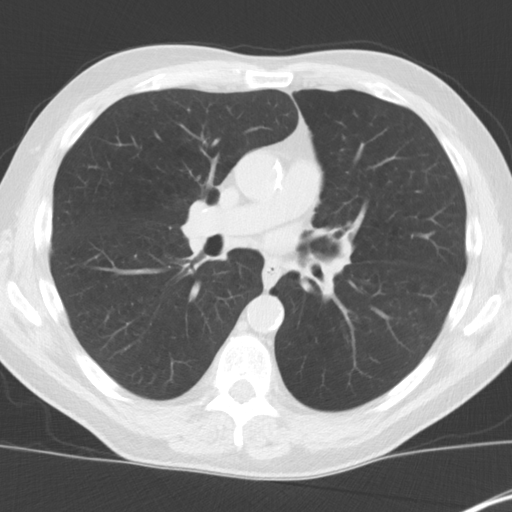
[im 33/61  lung]
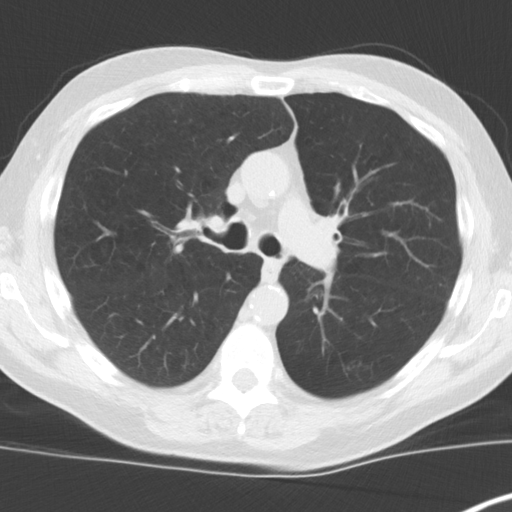
[im 37/61  lung]
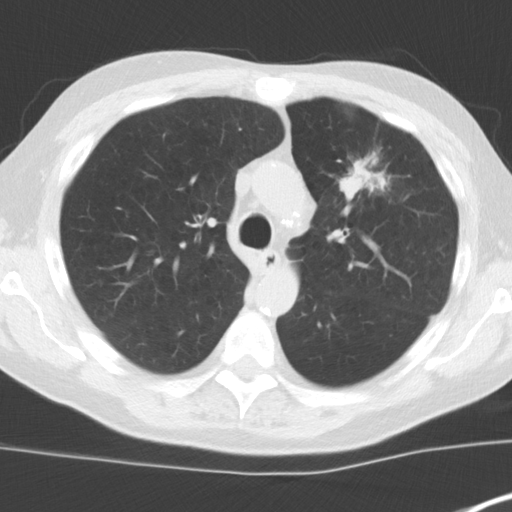
[im 42/61  mediastinal]
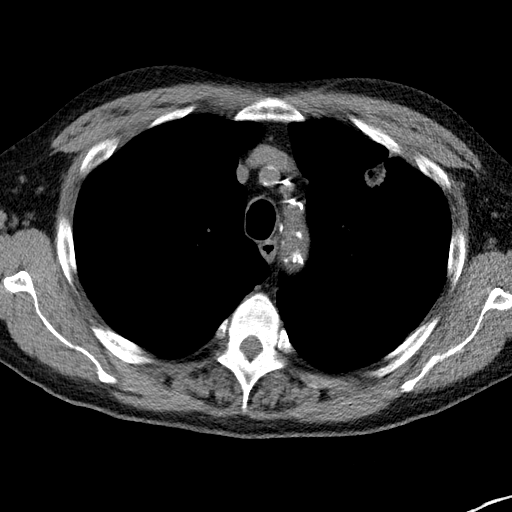
[im 42/61  lung]
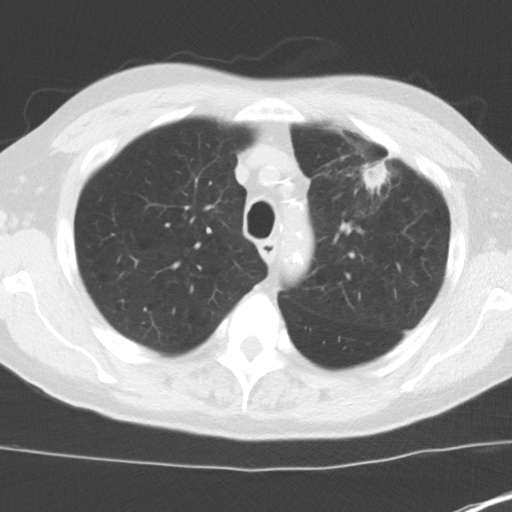
[im 47/61  lung]
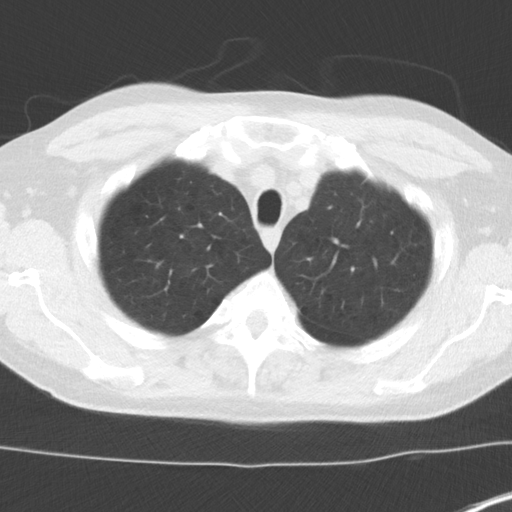
[im 51/61  lung]
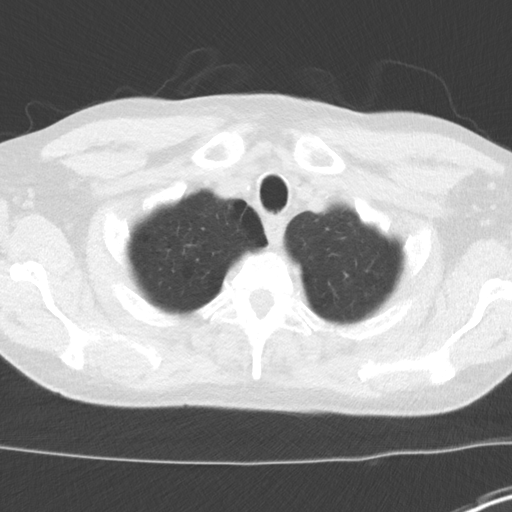
[im 56/61  lung]
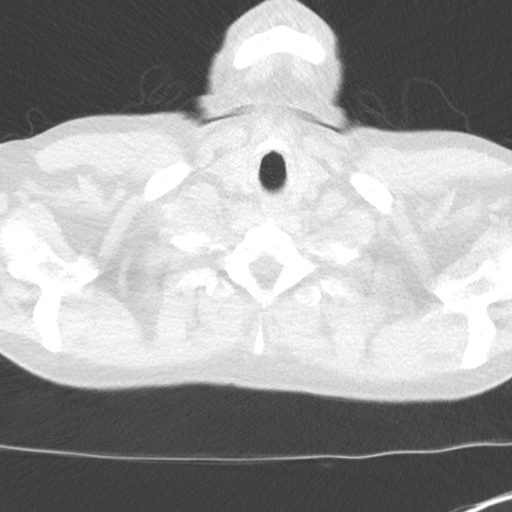

[Series 5: coronal · coronal · 0.63mm/px · 3 of 301 slices shown]
[im 61/301  lung]
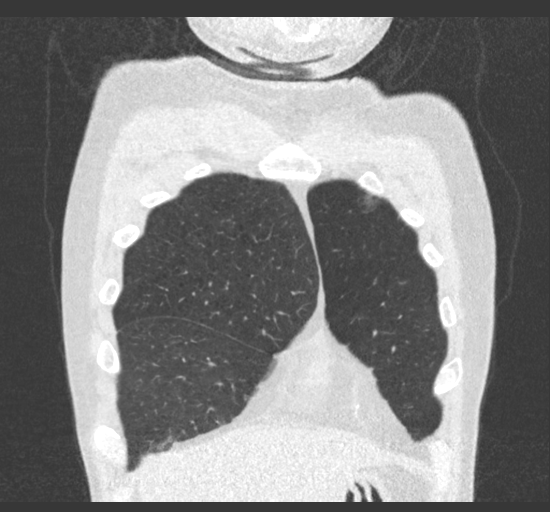
[im 121/301  lung]
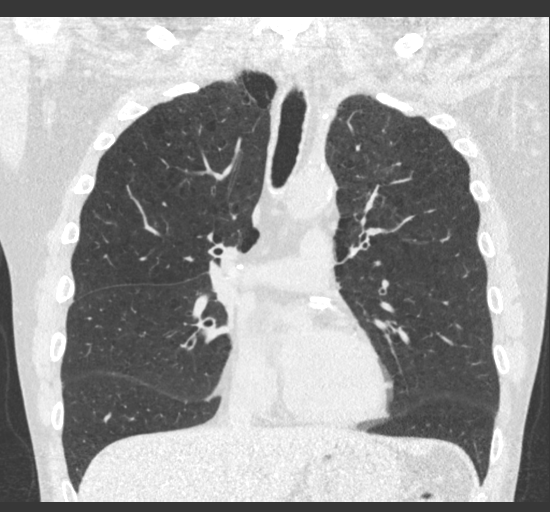
[im 181/301  lung]
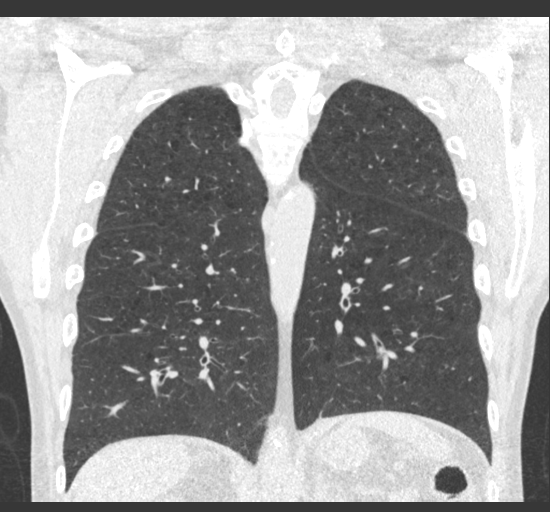

[15 of 40 positions shown; findings below may reference images not displayed]

FINDINGS: Cardiovascular: Heart is normal in size.  No pericardial effusion.

No evidence of thoracic aortic aneurysm. Atherosclerotic
calcifications of the aortic arch.

Three vessel coronary atherosclerosis.

Mediastinum/Nodes: Small mediastinal nodes, including a dominant 10
mm short axis AP window node (series 2/image 27).

Visualized thyroid is unremarkable.

Lungs/Pleura: Thick-walled, cavitary mass in the anterior left upper
lobe (series 4/image 107), measuring 4.4 x 3.0 x 3.5 cm (coronal
image 104; sagittal image 225), with 2.5 cm solid nodular component
inferomedially (series 4/image 117). This abuts and involves the
anterior pleura/chest wall (series 4/image 100). This appearance is
compatible with primary bronchogenic neoplasm such as squamous cell
carcinoma.

Moderate centrilobular and paraseptal emphysematous changes, upper
lung predominant.

No focal consolidation.

No pleural effusion or pneumothorax.

Upper Abdomen: Visualized upper abdomen is grossly unremarkable,
noting mild vascular calcifications.

Musculoskeletal: Expansile lytic lesions in the right posterolateral
9th and 10th ribs (series 2/images 46 and 52). While these are of
uncertain etiology, these are unchanged dating back to CT
abdomen/pelvis dated [DATE], confirming a benign etiology.

Visualized thoracolumbar spine is within normal limits.
IMPRESSION: Lung-RADS 4X, highly suspicious. Additional imaging evaluation or
consultation with Pulmonology or Thoracic Surgery recommended.

4.4 cm cavitary mass in the anterior left upper lobe, compatible
with primary bronchogenic neoplasm such as squamous cell carcinoma.
Consider discussion at multidisciplinary tumor board for assessment
of sampling. PET-CT may be beneficial for staging.

10 mm short axis AP window node, suspicious for nodal metastasis in
this clinical context.

These results will be called to the ordering clinician or
representative by the Radiologist Assistant, and communication
documented in the PACS or [REDACTED].

Aortic Atherosclerosis ([10]-[10]) and Emphysema ([10]-[10]).

## 2020-11-14 DIAGNOSIS — K219 Gastro-esophageal reflux disease without esophagitis: Secondary | ICD-10-CM | POA: Diagnosis not present

## 2020-11-14 DIAGNOSIS — I10 Essential (primary) hypertension: Secondary | ICD-10-CM | POA: Diagnosis not present

## 2020-11-17 NOTE — Addendum Note (Signed)
Encounter addended by: Annie Paras on: 11/17/2020 6:35 PM  Actions taken: Letter saved

## 2020-11-23 DIAGNOSIS — R000 Tachycardia, unspecified: Secondary | ICD-10-CM | POA: Diagnosis not present

## 2020-11-23 DIAGNOSIS — I10 Essential (primary) hypertension: Secondary | ICD-10-CM | POA: Diagnosis not present

## 2020-11-23 DIAGNOSIS — J441 Chronic obstructive pulmonary disease with (acute) exacerbation: Secondary | ICD-10-CM | POA: Diagnosis not present

## 2020-11-23 DIAGNOSIS — F1010 Alcohol abuse, uncomplicated: Secondary | ICD-10-CM | POA: Diagnosis not present

## 2020-12-01 ENCOUNTER — Other Ambulatory Visit: Payer: Self-pay

## 2020-12-01 ENCOUNTER — Ambulatory Visit (INDEPENDENT_AMBULATORY_CARE_PROVIDER_SITE_OTHER): Payer: Medicare HMO | Admitting: Pulmonary Disease

## 2020-12-01 ENCOUNTER — Encounter: Payer: Self-pay | Admitting: Pulmonary Disease

## 2020-12-01 VITALS — BP 118/62 | HR 108 | Temp 97.3°F | Ht 69.0 in | Wt 159.2 lb

## 2020-12-01 DIAGNOSIS — Z720 Tobacco use: Secondary | ICD-10-CM

## 2020-12-01 DIAGNOSIS — J984 Other disorders of lung: Secondary | ICD-10-CM | POA: Diagnosis not present

## 2020-12-01 DIAGNOSIS — J449 Chronic obstructive pulmonary disease, unspecified: Secondary | ICD-10-CM

## 2020-12-01 MED ORDER — BUPROPION HCL ER (SR) 150 MG PO TB12: 150.0000 mg | ORAL_TABLET | Freq: Two times a day (BID) | ORAL | 4 refills | Status: DC

## 2020-12-01 NOTE — Progress Notes (Signed)
[facility]  Chief Complaint  Patient presents with  . Consult    Non productive cough which patient states has had for years    Constitutional:  BP 118/62 (BP Location: Left Arm, Cuff Size: Normal)   Pulse (!) 108   Temp (!) 97.3 F (36.3 C) (Other (Comment)) Comment (Src): wrist  Ht 5\' 9"  (1.753 m)   Wt 159 lb 3.2 oz (72.2 kg)   SpO2 96% Comment: Room air  BMI 23.51 kg/m   Past Medical History:  GERD, Alcoholic hepatitis, HTN  Past Surgical History:  He  has a past surgical history that includes Cardiac catheterization; Wrist ganglion excision (1985); and Colonoscopy (03/14/2012).  Brief Summary:  Bryan Fleming is a 60 y.o. male smoker with COPD and abnormal CT chest.      Subjective:   He is here with his girlfriend and his sister.  He had LDCT chest with his PCP.  Found to have Lt upper lobe mass.  He smokes about 1.5 ppd.  He has cough with clear sputum.  Denies hemoptysis, chest pain, fever, sweats, or weight loss.  Hasn't noticed skin rash or gland swelling.  Keeps up with activity at steady pace on even ground.  Gets coughing spells when asleep.  Has intermittent wheeze.  Has been using albuterol and this helps.  Using symbicort also.  Tried nicotine patch before.  Hasn't tried any medications to help smoking cessation.  No history of seizures.  Physical Exam:   Appearance - well kempt   ENMT - no sinus tenderness, no oral exudate, no LAN, Mallampati 3 airway, no stridor  Respiratory - equal breath sounds bilaterally, no wheezing or rales  CV - s1s2 regular rate and rhythm, no murmurs  Ext - no clubbing, no edema  Skin - no rashes  Psych - normal mood and affect   Pulmonary testing:    Chest Imaging:   LDCT chest 10/18/20 >> mass anterior LUL 4.4 x 3 x 3.5 cm abuts chest wall, moderate centrilobular and paraseptal emphysema  Social History:  He  reports that he has been smoking cigarettes. He has a  37.00 pack-year smoking history. He has never used smokeless tobacco. He reports current alcohol use of about 22.0 standard drinks of alcohol per week. He reports that he does not use drugs.  Family History:  His family history includes Asthma in his father; Diabetes in his sister; Hypertension in his brother, mother, sister, and sister; Lupus in his daughter.    Discussion:  He has extensive history of smoking.  He reports history of COPD.  His chest imaging with lung cancer screening shows cavitary mass in left upper lobe concerning for primary lung malignancy.  He also has emphysema.  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 - will arrange for PET scan and referral to multidisciplinary thoracic oncology conference - based on findings will determine next step in approach to obtaining a diagnosis  COPD with centrilobular emphysema. - continue symbicort and prn albuterol - will arrange for pulmonary function test to assess current status of lung function  Tobacco abuse. - reviewed options for smoking cessation - he would not want to use chantix - he is agreeable to try bupropion; reviewed dosing schedule, setting quit date and side effects to monitor for - advised that he can use nicotine patch while using bupropion  Time Spent Involved in Patient Care on Day of Examination:  48 minutes  Follow up:  Patient Instructions  Will arrange for PET scan and pulmonary function test.   Will arrange for referral to multidisciplinary thoracic oncology conference [facility]).  Bupropion 150 mg pill for smoking cessation >> use 1 pill daily for first 3 days, then 1 pill twice daily.  After you have used bupropion for one week, then stop smoking.  You can use nicotine patches as needed while on bupropion.  Follow up in 6 weeks.   Medication List:   Allergies as of 12/01/2020      Reactions   Other Nausea Only, Other (See Comments)   No spicy foods- Patient cannot tolerate-  HEARTBURN      Medication List       Accurate as of December 01, 2020 12:17 PM. If you have any questions, ask your nurse or doctor.        STOP taking these medications   chlordiazePOXIDE 25 MG capsule Commonly known as: LIBRIUM Stopped by: Chesley Mires, MD   omeprazole 40 MG capsule Commonly known as: PRILOSEC Stopped by: Chesley Mires, MD   tetrahydrozoline 0.05 % ophthalmic solution Stopped by: Chesley Mires, MD     TAKE these medications   alum & mag hydroxide-simeth 200-200-20 MG/5ML suspension Commonly known as: MAALOX/MYLANTA Take 30 mLs by mouth every 6 (six) hours as needed for indigestion or heartburn.   amLODipine 5 MG tablet Commonly known as: NORVASC Take 5 mg by mouth daily.   aspirin 81 MG tablet Take 81 mg by mouth daily.   bismuth subsalicylate 657 MG chewable tablet Commonly known as: PEPTO BISMOL Chew 524 mg by mouth 3 (three) times daily as needed for indigestion.   buPROPion 150 MG 12 hr tablet Commonly known as: WELLBUTRIN SR Take 1 tablet (150 mg total) by mouth 2 (two) times daily. Started by: Chesley Mires, MD   diphenhydrAMINE 25 MG tablet Commonly known as: BENADRYL Take 25 mg by mouth daily as needed for itching or allergies.   folic acid 1 MG tablet Commonly known as: FOLVITE Take 1 tablet (1 mg total) by mouth daily.   loperamide 2 MG tablet Commonly known as: IMODIUM A-D Take 2 mg by mouth daily as needed for diarrhea or loose stools.   metoprolol tartrate 25 MG tablet Commonly known as: LOPRESSOR Take 0.5 tablets (12.5 mg total) by mouth 2 (two) times daily.   multivitamin with minerals Tabs tablet Take 1 tablet by mouth daily.   oxymetazoline 0.05 % nasal spray Commonly known as: AFRIN Place 2 sprays into both nostrils daily as needed for congestion.   Proventil HFA 108 (90 Base) MCG/ACT inhaler Generic drug: albuterol INHALE 2 PUFFS BY MOUTH EVERY 6 HOURS AS NEEDED FOR COUGHING, WHEEZING, OR SHORTNESS OF BREATH What  changed: See the new instructions.   Symbicort 160-4.5 MCG/ACT inhaler Generic drug: budesonide-formoterol Inhale 2 puffs into the lungs in the morning and at bedtime.   thiamine 100 MG tablet Take 1 tablet (100 mg total) by mouth daily.   vitamin B-12 500 MCG tablet Commonly known as: CYANOCOBALAMIN Take 500 mcg by mouth daily.       Signature:  Chesley Mires, MD [place] Pager - [phone] 12/01/2020, 12:17 PM

## 2020-12-01 NOTE — H&P (View-Only) (Signed)
[facility]  Chief Complaint  Patient presents with  . Consult    Non productive cough which patient states has had for years    Constitutional:  BP 118/62 (BP Location: Left Arm, Cuff Size: Normal)   Pulse (!) 108   Temp (!) 97.3 F (36.3 C) (Other (Comment)) Comment (Src): wrist  Ht 5\' 9"  (1.753 m)   Wt 159 lb 3.2 oz (72.2 kg)   SpO2 96% Comment: Room air  BMI 23.51 kg/m   Past Medical History:  GERD, Alcoholic hepatitis, HTN  Past Surgical History:  He  has a past surgical history that includes Cardiac catheterization; Wrist ganglion excision (1985); and Colonoscopy (03/14/2012).  Brief Summary:  Bryan Fleming is a 60 y.o. male smoker with COPD and abnormal CT chest.      Subjective:   He is here with his girlfriend and his sister.  He had LDCT chest with his PCP.  Found to have Lt upper lobe mass.  He smokes about 1.5 ppd.  He has cough with clear sputum.  Denies hemoptysis, chest pain, fever, sweats, or weight loss.  Hasn't noticed skin rash or gland swelling.  Keeps up with activity at steady pace on even ground.  Gets coughing spells when asleep.  Has intermittent wheeze.  Has been using albuterol and this helps.  Using symbicort also.  Tried nicotine patch before.  Hasn't tried any medications to help smoking cessation.  No history of seizures.  Physical Exam:   Appearance - well kempt   ENMT - no sinus tenderness, no oral exudate, no LAN, Mallampati 3 airway, no stridor  Respiratory - equal breath sounds bilaterally, no wheezing or rales  CV - s1s2 regular rate and rhythm, no murmurs  Ext - no clubbing, no edema  Skin - no rashes  Psych - normal mood and affect   Pulmonary testing:    Chest Imaging:   LDCT chest 10/18/20 >> mass anterior LUL 4.4 x 3 x 3.5 cm abuts chest wall, moderate centrilobular and paraseptal emphysema  Social History:  He  reports that he has been smoking cigarettes. He has a  37.00 pack-year smoking history. He has never used smokeless tobacco. He reports current alcohol use of about 22.0 standard drinks of alcohol per week. He reports that he does not use drugs.  Family History:  His family history includes Asthma in his father; Diabetes in his sister; Hypertension in his brother, mother, sister, and sister; Lupus in his daughter.    Discussion:  He has extensive history of smoking.  He reports history of COPD.  His chest imaging with lung cancer screening shows cavitary mass in left upper lobe concerning for primary lung malignancy.  He also has emphysema.  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 - will arrange for PET scan and referral to multidisciplinary thoracic oncology conference - based on findings will determine next step in approach to obtaining a diagnosis  COPD with centrilobular emphysema. - continue symbicort and prn albuterol - will arrange for pulmonary function test to assess current status of lung function  Tobacco abuse. - reviewed options for smoking cessation - he would not want to use chantix - he is agreeable to try bupropion; reviewed dosing schedule, setting quit date and side effects to monitor for - advised that he can use nicotine patch while using bupropion  Time Spent Involved in Patient Care on Day of Examination:  48 minutes  Follow up:  Patient Instructions  Will arrange for PET scan and pulmonary function test.   Will arrange for referral to multidisciplinary thoracic oncology conference [facility]).  Bupropion 150 mg pill for smoking cessation >> use 1 pill daily for first 3 days, then 1 pill twice daily.  After you have used bupropion for one week, then stop smoking.  You can use nicotine patches as needed while on bupropion.  Follow up in 6 weeks.   Medication List:   Allergies as of 12/01/2020      Reactions   Other Nausea Only, Other (See Comments)   No spicy foods- Patient cannot tolerate-  HEARTBURN      Medication List       Accurate as of December 01, 2020 12:17 PM. If you have any questions, ask your nurse or doctor.        STOP taking these medications   chlordiazePOXIDE 25 MG capsule Commonly known as: LIBRIUM Stopped by: Chesley Mires, MD   omeprazole 40 MG capsule Commonly known as: PRILOSEC Stopped by: Chesley Mires, MD   tetrahydrozoline 0.05 % ophthalmic solution Stopped by: Chesley Mires, MD     TAKE these medications   alum & mag hydroxide-simeth 200-200-20 MG/5ML suspension Commonly known as: MAALOX/MYLANTA Take 30 mLs by mouth every 6 (six) hours as needed for indigestion or heartburn.   amLODipine 5 MG tablet Commonly known as: NORVASC Take 5 mg by mouth daily.   aspirin 81 MG tablet Take 81 mg by mouth daily.   bismuth subsalicylate 143 MG chewable tablet Commonly known as: PEPTO BISMOL Chew 524 mg by mouth 3 (three) times daily as needed for indigestion.   buPROPion 150 MG 12 hr tablet Commonly known as: WELLBUTRIN SR Take 1 tablet (150 mg total) by mouth 2 (two) times daily. Started by: Chesley Mires, MD   diphenhydrAMINE 25 MG tablet Commonly known as: BENADRYL Take 25 mg by mouth daily as needed for itching or allergies.   folic acid 1 MG tablet Commonly known as: FOLVITE Take 1 tablet (1 mg total) by mouth daily.   loperamide 2 MG tablet Commonly known as: IMODIUM A-D Take 2 mg by mouth daily as needed for diarrhea or loose stools.   metoprolol tartrate 25 MG tablet Commonly known as: LOPRESSOR Take 0.5 tablets (12.5 mg total) by mouth 2 (two) times daily.   multivitamin with minerals Tabs tablet Take 1 tablet by mouth daily.   oxymetazoline 0.05 % nasal spray Commonly known as: AFRIN Place 2 sprays into both nostrils daily as needed for congestion.   Proventil HFA 108 (90 Base) MCG/ACT inhaler Generic drug: albuterol INHALE 2 PUFFS BY MOUTH EVERY 6 HOURS AS NEEDED FOR COUGHING, WHEEZING, OR SHORTNESS OF BREATH What  changed: See the new instructions.   Symbicort 160-4.5 MCG/ACT inhaler Generic drug: budesonide-formoterol Inhale 2 puffs into the lungs in the morning and at bedtime.   thiamine 100 MG tablet Take 1 tablet (100 mg total) by mouth daily.   vitamin B-12 500 MCG tablet Commonly known as: CYANOCOBALAMIN Take 500 mcg by mouth daily.       Signature:  Chesley Mires, MD [place] Pager - [phone] 12/01/2020, 12:17 PM

## 2020-12-01 NOTE — Patient Instructions (Signed)
Will arrange for PET scan and pulmonary function test.   Will arrange for referral to multidisciplinary thoracic oncology conference [facility]).  Bupropion 150 mg pill for smoking cessation >> use 1 pill daily for first 3 days, then 1 pill twice daily.  After you have used bupropion for one week, then stop smoking.  You can use nicotine patches as needed while on bupropion.  Follow up in 6 weeks.

## 2020-12-01 NOTE — Addendum Note (Signed)
Addended by: Merrilee Seashore on: 12/01/2020 04:28 PM   Modules accepted: Orders

## 2020-12-03 ENCOUNTER — Encounter (HOSPITAL_COMMUNITY): Payer: Self-pay | Admitting: Hematology

## 2020-12-03 ENCOUNTER — Other Ambulatory Visit: Payer: Self-pay

## 2020-12-03 ENCOUNTER — Inpatient Hospital Stay (HOSPITAL_COMMUNITY): Payer: Medicare HMO | Attending: Hematology | Admitting: Hematology

## 2020-12-03 ENCOUNTER — Encounter (HOSPITAL_COMMUNITY): Payer: Self-pay

## 2020-12-03 VITALS — BP 140/72 | HR 112 | Temp 97.0°F | Resp 18 | Wt 158.6 lb

## 2020-12-03 DIAGNOSIS — Z79899 Other long term (current) drug therapy: Secondary | ICD-10-CM | POA: Diagnosis not present

## 2020-12-03 DIAGNOSIS — R058 Other specified cough: Secondary | ICD-10-CM | POA: Insufficient documentation

## 2020-12-03 DIAGNOSIS — Z7951 Long term (current) use of inhaled steroids: Secondary | ICD-10-CM | POA: Diagnosis not present

## 2020-12-03 DIAGNOSIS — K219 Gastro-esophageal reflux disease without esophagitis: Secondary | ICD-10-CM | POA: Insufficient documentation

## 2020-12-03 DIAGNOSIS — F17210 Nicotine dependence, cigarettes, uncomplicated: Secondary | ICD-10-CM | POA: Insufficient documentation

## 2020-12-03 DIAGNOSIS — I10 Essential (primary) hypertension: Secondary | ICD-10-CM | POA: Diagnosis not present

## 2020-12-03 DIAGNOSIS — J449 Chronic obstructive pulmonary disease, unspecified: Secondary | ICD-10-CM | POA: Diagnosis not present

## 2020-12-03 DIAGNOSIS — Z801 Family history of malignant neoplasm of trachea, bronchus and lung: Secondary | ICD-10-CM | POA: Diagnosis not present

## 2020-12-03 DIAGNOSIS — Z7982 Long term (current) use of aspirin: Secondary | ICD-10-CM | POA: Diagnosis not present

## 2020-12-03 DIAGNOSIS — C3412 Malignant neoplasm of upper lobe, left bronchus or lung: Secondary | ICD-10-CM | POA: Diagnosis not present

## 2020-12-03 DIAGNOSIS — R918 Other nonspecific abnormal finding of lung field: Secondary | ICD-10-CM | POA: Insufficient documentation

## 2020-12-03 DIAGNOSIS — C3490 Malignant neoplasm of unspecified part of unspecified bronchus or lung: Secondary | ICD-10-CM

## 2020-12-03 DIAGNOSIS — R202 Paresthesia of skin: Secondary | ICD-10-CM | POA: Insufficient documentation

## 2020-12-03 NOTE — Patient Instructions (Signed)
[place] [facility] Discharge Instructions  You were seen and examined today by Dr. Delton Coombes. Dr. Delton Coombes is a medical oncologist, meaning he specializes in the management of cancer diagnoses with medications. Dr. Delton Coombes discussed your past medical history, family history of cancer and the events that led to you being here today.  On your recent CT scan, there was an area noted in your left lung highly suggestive of lung cancer. Dr. Delton Coombes has recommended proceeding with a PET scan, this is a specialized CT scan that illuminates where cancer is present in your body. Dr. Delton Coombes has also recommended a brain MRI, this is done for any lung lesions to be sure that we are not missing anything. Dr. Delton Coombes has also recommended pulmonary function tests. Ideally, if there is no spread of cancer beyond the lesion noted, the best course of treatment will be surgery. We will make this referral following the PET scan.  Follow-up as scheduled.   Thank you for choosing [place] at [facility] to provide your oncology and hematology care.  To afford each patient quality time with our provider, please arrive at least 15 minutes before your scheduled appointment time.   If you have a lab appointment with the [facility] please come in thru the Main Entrance and check in at the main information desk.  You need to re-schedule your appointment should you arrive 10 or more minutes late.  We strive to give you quality time with our providers, and arriving late affects you and other patients whose appointments are after yours.  Also, if you no show three or more times for appointments you may be dismissed from the clinic at the providers discretion.     Again, thank you for choosing [facility].  Our hope is that these requests will decrease the amount of time that you wait before being seen by our physicians.        _____________________________________________________________  Should you have questions after your visit to [facility], please contact our office at [phone] and follow the prompts.  Our office hours are 8:00 a.m. and 4:30 p.m. Monday - Friday.  Please note that voicemails left after 4:00 p.m. may not be returned until the following business day.  We are closed weekends and major holidays.  You do have access to a nurse 24-7, just call the main number to the clinic [phone] and do not press any options, hold on the line and a nurse will answer the phone.    For prescription refill requests, have your pharmacy contact our office and allow 72 hours.    Due to Covid, you will need to wear a mask upon entering the hospital. If you do not have a mask, a mask will be given to you at the Main Entrance upon arrival. For doctor visits, patients may have 1 support person age 20 or older with them. For treatment visits, patients can not have anyone with them due to social distancing guidelines and our immunocompromised population.

## 2020-12-03 NOTE — Progress Notes (Signed)
[name] [address] [place] [address]   CLINIC:  Medical Oncology/Hematology  CONSULT NOTE  Patient Care Team: Rosita Fire, MD as PCP - General (Internal Medicine)  CHIEF COMPLAINTS/PURPOSE OF CONSULTATION:  Evaluation of cavitating mass in left upper lobe of lung  HISTORY OF PRESENTING ILLNESS:  [name] 60 y.o. male is here because of evaluation of cavitating mass in left upper lobe of lung, at the request of Dr. Chesley Mires.  Today he is accompanied by his partner and he reports feeling fair. He denies having any significant weight loss, F/C, night sweats or leg swelling. He has a worsening productive cough with yellow sputum and SOB, which is chronic from his COPD, but denies hemoptysis, CP, headaches or vision changes. He reports having SOB if he has to do something briskly, but can tolerate a mild walking pace. He uses an inhaler for wheezing; he admits to not using it today. He reports having intermittent tingling in his feet which is new and started several months ago, occurring about once to twice per week. He also admits to having intermittent sharp epigastric abdominal pain due to his GERD. He denies having history of MI's or CVA's.   He is currently on disability. 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  He continues smoking 1.5 PPD and has smoked 1 PPD for about 35 years. He has started taking Wellbutrin for smoking cessation. He drinks alcohol daily, about 6 beers and half pint of liquor daily. His brother had throat cancer; another brother had metastatic lung cancer.  He is scheduled to have a PET scan on 05/09.  MEDICAL HISTORY:  Past Medical History:  Diagnosis Date  . COPD (chronic obstructive pulmonary disease) ([place]) 2007   EMPHYSEMA  . GERD (gastroesophageal reflux disease) 2010  . Hepatitis    Alcoholic   . Hypertension     SURGICAL HISTORY: Past  Surgical History:  Procedure Laterality Date  . CARDIAC CATHETERIZATION     4-5 YEARS AGO  . COLONOSCOPY  03/14/2012   Procedure: COLONOSCOPY;  Surgeon: Rogene Houston, MD;  Location: [facility];  Service: Endoscopy;  Laterality: N/A;  100  . WRIST GANGLION EXCISION  1985    SOCIAL HISTORY: Social History   Socioeconomic History  . Marital status: Widowed    Spouse name: Not on file  . Number of children: Not on file  . Years of education: Not on file  . Highest education level: Not on file  Occupational History  . Not on file  Tobacco Use  . Smoking status: Current Every Day Smoker    Packs/day: 1.00    Years: 37.00    Pack years: 37.00    Types: Cigarettes  . Smokeless tobacco: Never Used  . Tobacco comment: smokes 1.5 per day 12/01/2020  Vaping Use  . Vaping Use: Never used  Substance and Sexual Activity  . Alcohol use: Yes    Alcohol/week: 22.0 standard drinks    Types: 21 Cans of beer, 1 Standard drinks or equivalent per week    Comment: daily  . Drug use: No  . Sexual activity: Yes  Other Topics Concern  . Not on file  Social History Narrative  . Not on file   Social Determinants of Health   Financial Resource Strain: Not on file  Food Insecurity: Not on file  Transportation Needs: No Transportation Needs  . Lack of  Transportation (Medical): No  . Lack of Transportation (Non-Medical): No  Physical Activity: Inactive  . Days of Exercise per Week: 0 days  . Minutes of Exercise per Session: 0 min  Stress: Not on file  Social Connections: Not on file  Intimate Partner Violence: Not At Risk  . Fear of Current or Ex-Partner: No  . Emotionally Abused: No  . Physically Abused: No  . Sexually Abused: No    FAMILY HISTORY: Family History  Problem Relation Age of Onset  . Hypertension Mother   . Asthma Father   . Diabetes Sister   . Hypertension Sister   . Hypertension Brother   . Hypertension Sister   . Lupus Daughter     ALLERGIES:  is allergic  to other.  MEDICATIONS:  Current Outpatient Medications  Medication Sig Dispense Refill  . alum & mag hydroxide-simeth (MAALOX/MYLANTA) 200-200-20 MG/5ML suspension Take 30 mLs by mouth every 6 (six) hours as needed for indigestion or heartburn.    Marland Kitchen amLODipine (NORVASC) 5 MG tablet Take 5 mg by mouth daily.    Marland Kitchen aspirin 81 MG tablet Take 81 mg by mouth daily.    Marland Kitchen bismuth subsalicylate (PEPTO BISMOL) 262 MG chewable tablet Chew 524 mg by mouth 3 (three) times daily as needed for indigestion.     Marland Kitchen buPROPion (WELLBUTRIN SR) 150 MG 12 hr tablet Take 1 tablet (150 mg total) by mouth 2 (two) times daily. 60 tablet 4  . diphenhydrAMINE (BENADRYL) 25 MG tablet Take 25 mg by mouth daily as needed for itching or allergies.     . folic acid (FOLVITE) 1 MG tablet Take 1 tablet (1 mg total) by mouth daily. 30 tablet 1  . loperamide (IMODIUM A-D) 2 MG tablet Take 2 mg by mouth daily as needed for diarrhea or loose stools.    . Multiple Vitamin (MULTIVITAMIN WITH MINERALS) TABS tablet Take 1 tablet by mouth daily.    Marland Kitchen oxymetazoline (AFRIN) 0.05 % nasal spray Place 2 sprays into both nostrils daily as needed for congestion.    Marland Kitchen PROVENTIL HFA 108 (90 Base) MCG/ACT inhaler INHALE 2 PUFFS BY MOUTH EVERY 6 HOURS AS NEEDED FOR COUGHING, WHEEZING, OR SHORTNESS OF BREATH (Patient taking differently: Inhale 2 puffs into the lungs every 6 (six) hours as needed for wheezing or shortness of breath (or coughing).) 20.1 g 1  . SYMBICORT 160-4.5 MCG/ACT inhaler Inhale 2 puffs into the lungs in the morning and at bedtime.    . thiamine 100 MG tablet Take 1 tablet (100 mg total) by mouth daily. 30 tablet 2  . vitamin B-12 (CYANOCOBALAMIN) 500 MCG tablet Take 500 mcg by mouth daily.    . metoprolol tartrate (LOPRESSOR) 25 MG tablet Take 0.5 tablets (12.5 mg total) by mouth 2 (two) times daily. 30 tablet 0   No current facility-administered medications for this visit.    REVIEW OF SYSTEMS:   Review of Systems   Constitutional: Positive for appetite change (50%) and fatigue (50%). Negative for chills, diaphoresis, fever and unexpected weight change.  Eyes: Negative for eye problems.  Respiratory: Positive for cough (w/ yellow sputum; worsening), shortness of breath (worsening) and wheezing (occasional; inhaler PRN). Negative for hemoptysis.   Cardiovascular: Negative for chest pain and leg swelling.  Gastrointestinal: Positive for abdominal pain (intermittent sharp epigastric pain).  Neurological: Positive for numbness (intermittent tingling in his feet). Negative for headaches.  All other systems reviewed and are negative.    PHYSICAL EXAMINATION: ECOG PERFORMANCE STATUS: 1 - Symptomatic  but completely ambulatory  Vitals:   12/03/20 0806  BP: 140/72  Pulse: (!) 112  Resp: 18  Temp: (!) 97 F (36.1 C)  SpO2: 98%   Filed Weights   12/03/20 0806  Weight: 158 lb 9.6 oz (71.9 kg)   Physical Exam Vitals reviewed.  Constitutional:      Appearance: Normal appearance.  Cardiovascular:     Rate and Rhythm: Normal rate and regular rhythm.     Pulses: Normal pulses.     Heart sounds: Normal heart sounds.  Pulmonary:     Effort: Pulmonary effort is normal.     Breath sounds: Examination of the right-upper field reveals wheezing. Examination of the left-upper field reveals wheezing. Wheezing present.  Chest:  Breasts:     Right: No axillary adenopathy or supraclavicular adenopathy.     Left: No axillary adenopathy or supraclavicular adenopathy.    Abdominal:     Palpations: Abdomen is soft. There is no hepatomegaly, splenomegaly or mass.     Tenderness: There is no abdominal tenderness.     Hernia: No hernia is present.  Musculoskeletal:     Right lower leg: No edema.     Left lower leg: No edema.  Lymphadenopathy:     Cervical: No cervical adenopathy.     Upper Body:     Right upper body: No supraclavicular, axillary or pectoral adenopathy.     Left upper body: No supraclavicular,  axillary or pectoral adenopathy.     Lower Body: No right inguinal adenopathy. No left inguinal adenopathy.  Neurological:     General: No focal deficit present.     Mental Status: He is alert and oriented to person, place, and time.  Psychiatric:        Mood and Affect: Mood normal.        Behavior: Behavior normal.      LABORATORY DATA:  I have reviewed the data as listed CBC Latest Ref Rng & Units 06/05/2020 06/04/2020 12/06/2019  WBC 4.0 - 10.5 K/uL 7.4 10.9(H) 11.4(H)  Hemoglobin 13.0 - 17.0 g/dL 13.6 16.3 17.2(H)  Hematocrit 39.0 - 52.0 % 42.2 48.2 52.9(H)  Platelets 150 - 400 K/uL 171 232 344   CMP Latest Ref Rng & Units 06/05/2020 06/04/2020 12/06/2019  Glucose 70 - 99 mg/dL 106(H) 203(H) 102(H)  BUN 6 - 20 mg/dL 5(L) 5(L) 15  Creatinine 0.61 - 1.24 mg/dL 0.61 0.73 0.88  Sodium 135 - 145 mmol/L 133(L) 135 136  Potassium 3.5 - 5.1 mmol/L 4.4 3.1(L) 3.9  Chloride 98 - 111 mmol/L 99 88(L) 97(L)  CO2 22 - 32 mmol/L 26 30 25   Calcium 8.9 - 10.3 mg/dL 8.8(L) 9.4 10.0  Total Protein 6.5 - 8.1 g/dL 6.7 8.6(H) 8.3(H)  Total Bilirubin 0.3 - 1.2 mg/dL 1.1 1.1 2.1(H)  Alkaline Phos 38 - 126 U/L 63 88 78  AST 15 - 41 U/L 28 69(H) 21  ALT 0 - 44 U/L 20 32 15    RADIOGRAPHIC STUDIES: I have personally reviewed the radiological images as listed and agreed with the findings in the report. No results found.  ASSESSMENT:  1.  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 Halford Chessman for left upper lobe lung mass. - Is current active smoker, 1/2 pack/day for 35 years. - CT lung cancer screening scan on 10/16/2020 showed cavitary mass in the anterior left upper lobe measuring 4.4 x 3.0 x 3.5 cm with 2.5 cm solid nodular component  inferomedially.  This abuts and involves the anterior pleura/chest wall.  Expansile lytic lesion in the right posterolateral ninth and 10th ribs (unchanged dating back to CT from 06/16/2016).  10 mm short axis AP window node suspicious  for nodal metastasis. - No change in baseline cough.  No hemoptysis.  No chest pain.  2.  Social/family history: - Previously worked in Charity fundraiser, currently on disability. - Current active smoker, 1/2 pack/day for 35 years. - Drinks 6 pack of beers along with 1 quart of liquor every day. - 1 brother had throat cancer and another brother had lung cancer.     PLAN:  1.  Cavitating mass of left upper lobe of lung: - Reviewed images of the CT scan with the patient and his wife in detail. - Recommend PET CT scan.  This is scheduled on 12/14/2020. - Recommend brain MRI with and without gadolinium. - Recommend full PFTs. - RTC after the PET scan for further recommendations for biopsy/surgery.  2.  Smoking cessation: - He is on Wellbutrin and trying to cut back. - I also encouraged him to completely stop drinking alcohol..    All questions were answered. The patient knows to call the clinic with any problems, questions or concerns.   Derek Jack, MD, 12/03/20 8:55 AM  [place] [phone]   I, Milinda Antis, am acting as a scribe for Dr. Sanda Linger.  I, Derek Jack MD, have reviewed the above documentation for accuracy and completeness, and I agree with the above.

## 2020-12-03 NOTE — Progress Notes (Signed)
I met with the patient and family today during initial visit with Dr. Delton Coombes. I introduced myself and explained my role in the patient's care. I provided my contact information and encouraged the patient to call with questions or concerns.

## 2020-12-07 ENCOUNTER — Other Ambulatory Visit: Payer: Self-pay

## 2020-12-07 ENCOUNTER — Other Ambulatory Visit (HOSPITAL_COMMUNITY)
Admission: RE | Admit: 2020-12-07 | Discharge: 2020-12-07 | Disposition: A | Discharge status: Home / Self Care | Payer: Medicare HMO | Source: Ambulatory Visit | Attending: Hematology | Admitting: Hematology

## 2020-12-07 DIAGNOSIS — Z20822 Contact with and (suspected) exposure to covid-19: Secondary | ICD-10-CM | POA: Diagnosis not present

## 2020-12-07 DIAGNOSIS — Z01812 Encounter for preprocedural laboratory examination: Secondary | ICD-10-CM | POA: Insufficient documentation

## 2020-12-07 LAB — SARS CORONAVIRUS 2 (TAT 6-24 HRS): SARS Coronavirus 2: NEGATIVE

## 2020-12-08 ENCOUNTER — Ambulatory Visit (HOSPITAL_COMMUNITY)
Admission: RE | Admit: 2020-12-08 | Discharge: 2020-12-08 | Disposition: A | Discharge status: Home / Self Care | Payer: Medicare PPO | Source: Ambulatory Visit | Attending: Hematology | Admitting: Hematology

## 2020-12-08 ENCOUNTER — Other Ambulatory Visit: Payer: Self-pay

## 2020-12-08 DIAGNOSIS — C3490 Malignant neoplasm of unspecified part of unspecified bronchus or lung: Secondary | ICD-10-CM | POA: Diagnosis not present

## 2020-12-08 DIAGNOSIS — C3412 Malignant neoplasm of upper lobe, left bronchus or lung: Secondary | ICD-10-CM | POA: Diagnosis not present

## 2020-12-08 LAB — PULMONARY FUNCTION TEST
DL/VA % pred: 48 %
DL/VA: 2.1 ml/min/mmHg/L
DLCO unc % pred: 41 %
DLCO unc: 11.39 ml/min/mmHg
FEF 25-75 Post: 0.64 L/sec
FEF 25-75 Pre: 0.71 L/sec
FEF2575-%Change-Post: -10 %
FEF2575-%Pred-Post: 21 %
FEF2575-%Pred-Pre: 24 %
FEV1-%Change-Post: -7 %
FEV1-%Pred-Post: 51 %
FEV1-%Pred-Pre: 56 %
FEV1-Post: 1.59 L
FEV1-Pre: 1.72 L
FEV1FVC-%Change-Post: -8 %
FEV1FVC-%Pred-Pre: 72 %
FEV6-%Change-Post: -3 %
FEV6-%Pred-Post: 73 %
FEV6-%Pred-Pre: 76 %
FEV6-Post: 2.77 L
FEV6-Pre: 2.87 L
FEV6FVC-%Change-Post: -3 %
FEV6FVC-%Pred-Post: 95 %
FEV6FVC-%Pred-Pre: 98 %
FVC-%Change-Post: 1 %
FVC-%Pred-Post: 78 %
FVC-%Pred-Pre: 77 %
FVC-Post: 3.05 L
FVC-Pre: 3.01 L
Post FEV1/FVC ratio: 52 %
Post FEV6/FVC ratio: 92 %
Pre FEV1/FVC ratio: 57 %
Pre FEV6/FVC Ratio: 95 %
RV % pred: 270 %
RV: 5.81 L
TLC % pred: 135 %
TLC: 9.22 L

## 2020-12-08 MED ORDER — ALBUTEROL SULFATE (2.5 MG/3ML) 0.083% IN NEBU
2.5000 mg | INHALATION_SOLUTION | Freq: Once | RESPIRATORY_TRACT | Status: AC
  Administered 2020-12-08: 2.5 mg via RESPIRATORY_TRACT

## 2020-12-09 ENCOUNTER — Telehealth: Payer: Self-pay | Admitting: Pulmonary Disease

## 2020-12-09 MED ORDER — NICOTINE 21 MG/24HR TD PT24: 21.0000 mg | MEDICATED_PATCH | Freq: Every day | TRANSDERMAL | 0 refills | Status: DC

## 2020-12-09 NOTE — Telephone Encounter (Signed)
I have sent script to his pharmacy.

## 2020-12-09 NOTE — Telephone Encounter (Signed)
Called and spoke with Patient. Patient stated he was needing a prescription to start Nicotine patches sent to [facility].   Message routed to Dr. Halford Chessman to advise on Nicotine dosage    12/01/20- OV with Dr. Halford Chessman Instructions    Return in about 6 weeks (around 01/12/2021). Will arrange for PET scan and pulmonary function test.   Will arrange for referral to multidisciplinary thoracic oncology conference [facility]).  Bupropion 150 mg pill for smoking cessation >> use 1 pill daily for first 3 days, then 1 pill twice daily.  After you have used bupropion for one week, then stop smoking.  You can use nicotine patches as needed while on bupropion.  Follow up in 6 weeks.

## 2020-12-14 ENCOUNTER — Other Ambulatory Visit: Payer: Self-pay

## 2020-12-14 ENCOUNTER — Ambulatory Visit (HOSPITAL_COMMUNITY)
Admission: RE | Admit: 2020-12-14 | Discharge: 2020-12-14 | Disposition: A | Discharge status: Home / Self Care | Payer: Medicare PPO | Source: Ambulatory Visit | Attending: Pulmonary Disease | Admitting: Pulmonary Disease

## 2020-12-14 DIAGNOSIS — R911 Solitary pulmonary nodule: Secondary | ICD-10-CM | POA: Diagnosis not present

## 2020-12-14 DIAGNOSIS — J984 Other disorders of lung: Secondary | ICD-10-CM | POA: Diagnosis not present

## 2020-12-14 IMAGING — CT NM PET TUM IMG INITIAL (PI) SKULL BASE T - THIGH
6 of 7 series · 25 of 52 positions shown · non-contrast
Comparison: Chest CT of [DATE].  Abdominopelvic CT [DATE]

CLINICAL DATA: Initial treatment strategy for left upper lobe
cavitary lung mass on lung cancer screening CT. Borderline AP window
adenopathy.

EXAM:
NUCLEAR MEDICINE PET SKULL BASE TO THIGH
TECHNIQUE: 9.6 mCi F-18 FDG was injected intravenously. Full-ring PET imaging
was performed from the skull base to thigh after the radiotracer. CT
data was obtained and used for attenuation correction and anatomic
localization.
Fasting blood glucose: 90 mg/dl

[Series 3: ct wb fusion · axial · 5.0mm · 0.98mm/px · z∈[-1341,-758]mm · 3 of 351 slices shown]
[im 59/351]
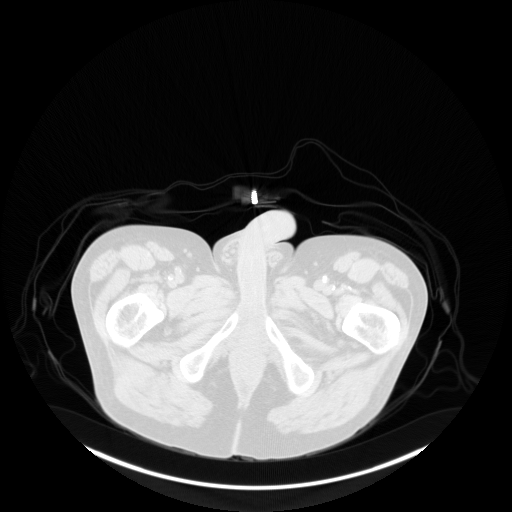
[im 176/351]
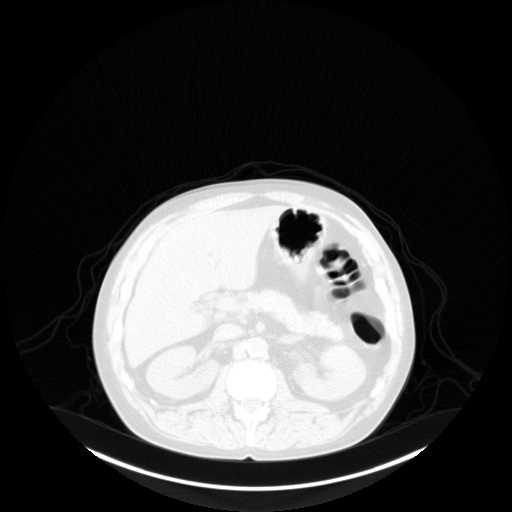
[im 292/351]
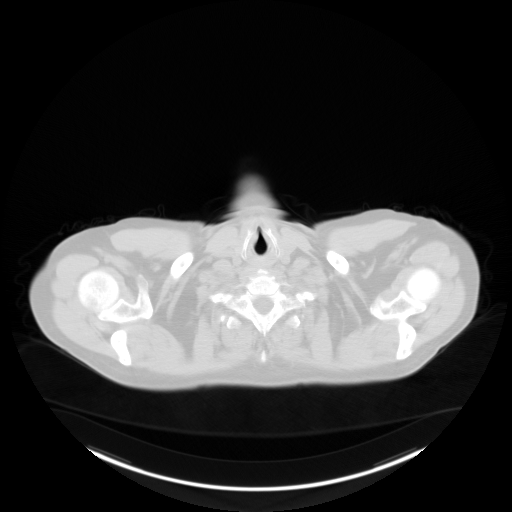

[Series 4: pet wb · axial · 5.0mm · 4.11mm/px · z∈[-1486,-611]mm · 4 of 351 slices shown]
[im 1/351]
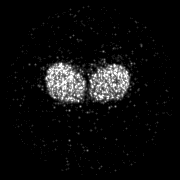
[im 117/351]
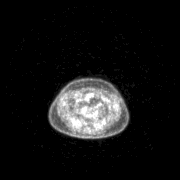
[im 234/351]
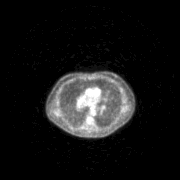
[im 351/351]
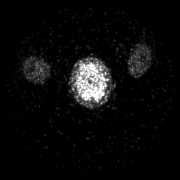

[Series 5: pet wb uncorrected · axial · 5.0mm · 4.11mm/px · z∈[-1341,-758]mm · 3 of 351 slices shown]
[im 59/351]
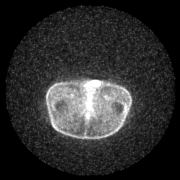
[im 176/351]
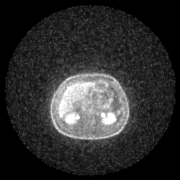
[im 292/351]
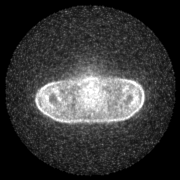

[mip · 5 of 48 frames shown]
[frame 1/48]
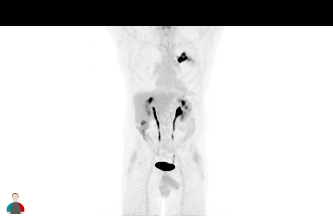
[frame 11/48]
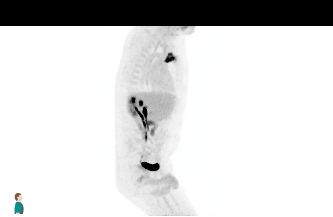
[frame 27/48]
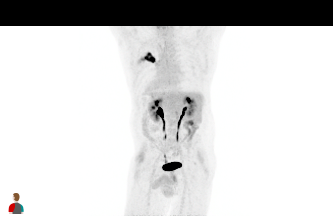
[frame 37/48]
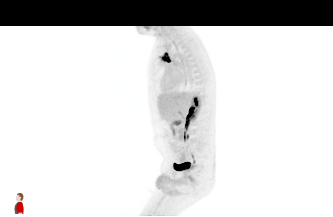
[frame 48/48]
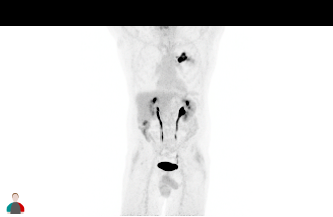

[axial ct wb fusion · 5 of 170 frames shown]
[frame 19/170]
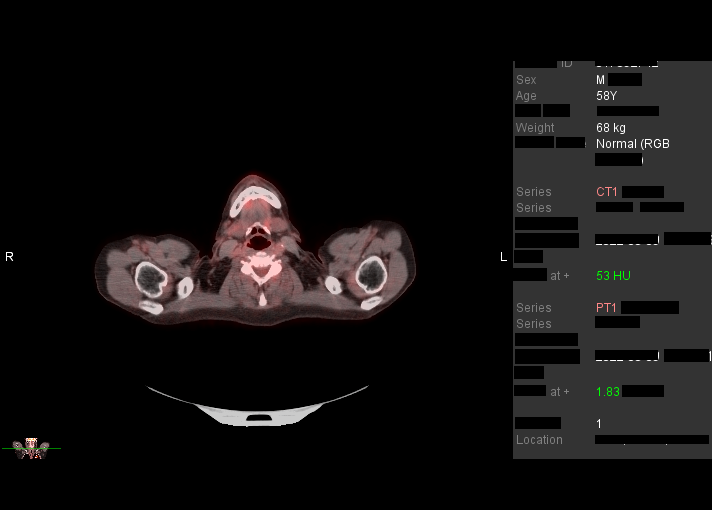
[frame 57/170]
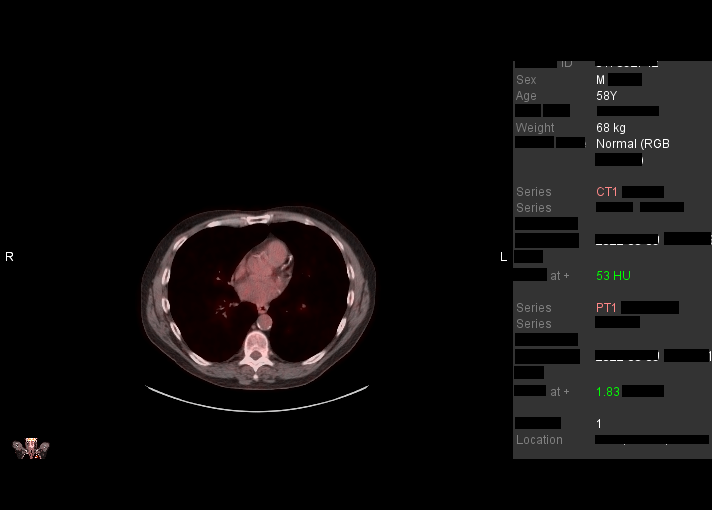
[frame 94/170]
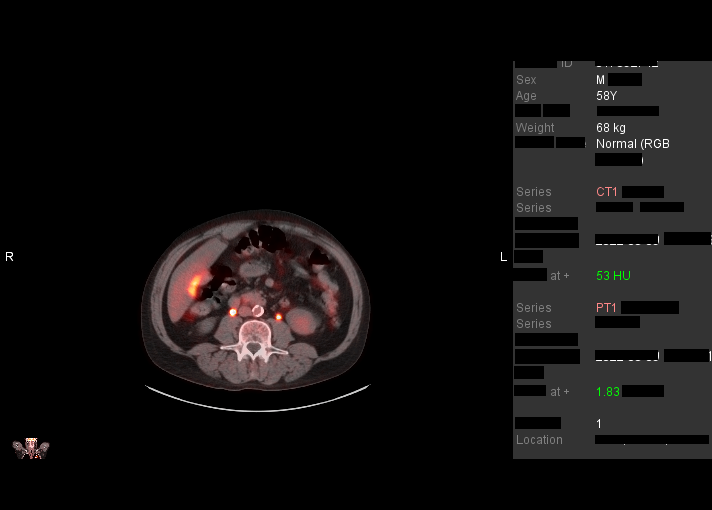
[frame 132/170]
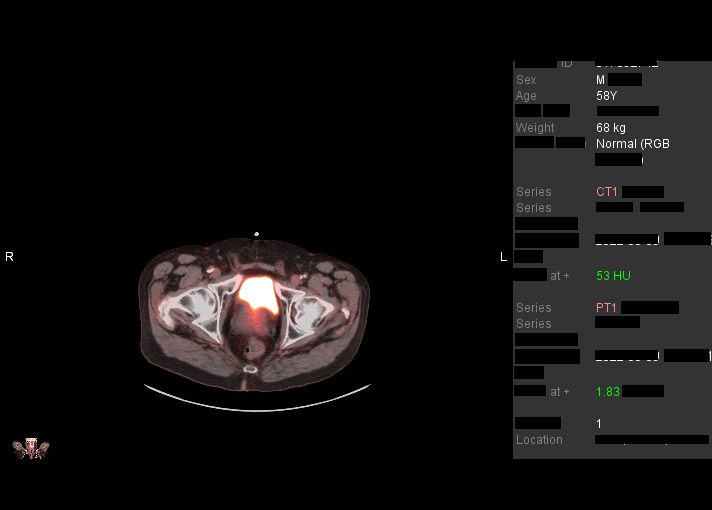
[frame 170/170]
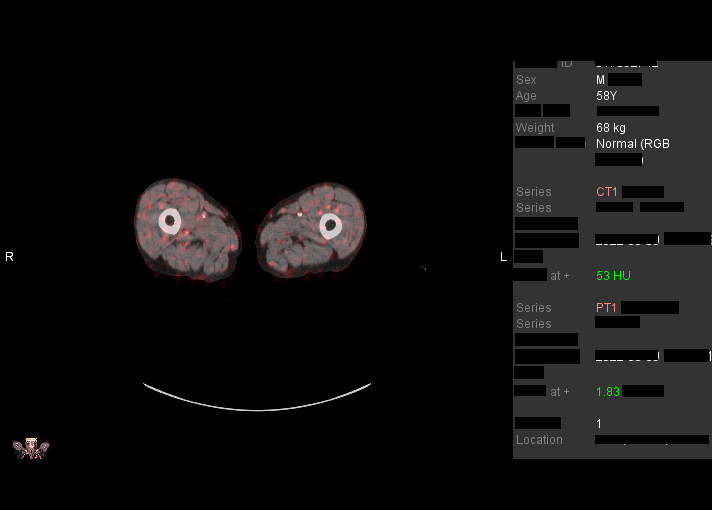

[coronal ct wb fusion · 5 of 46 frames shown]
[frame 6/46]
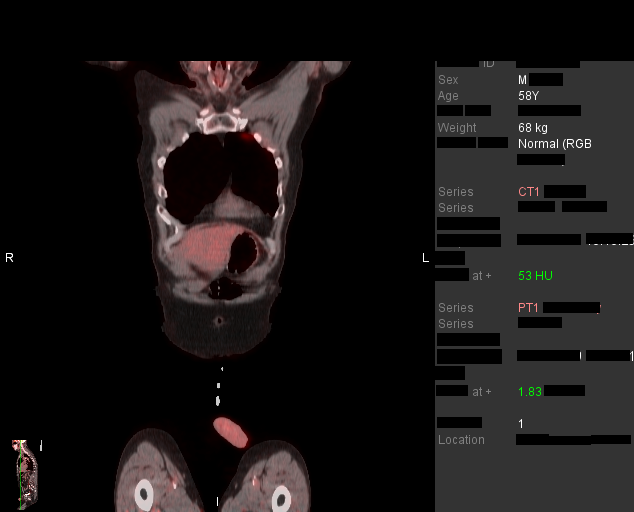
[frame 16/46]
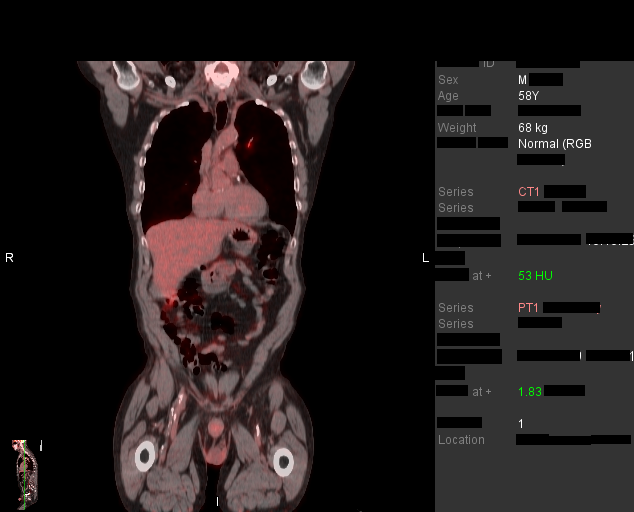
[frame 26/46]
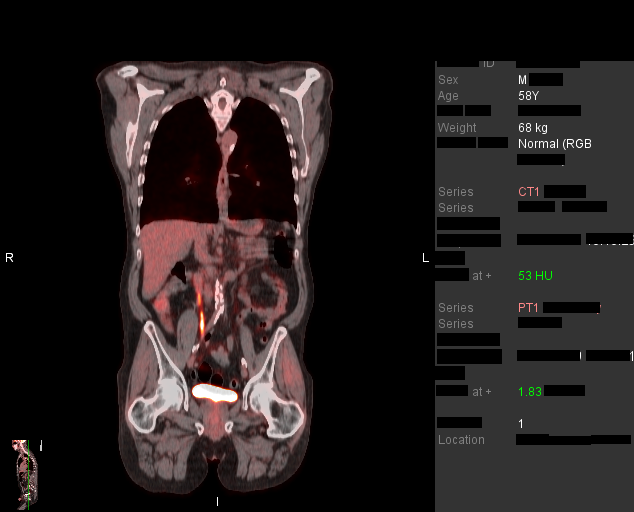
[frame 36/46]
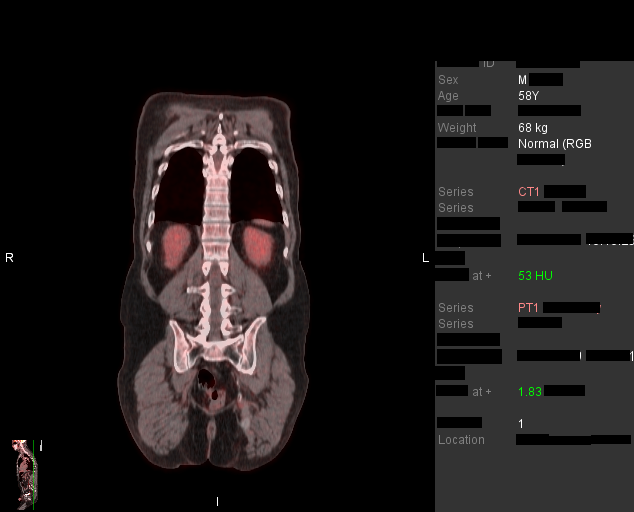
[frame 46/46]
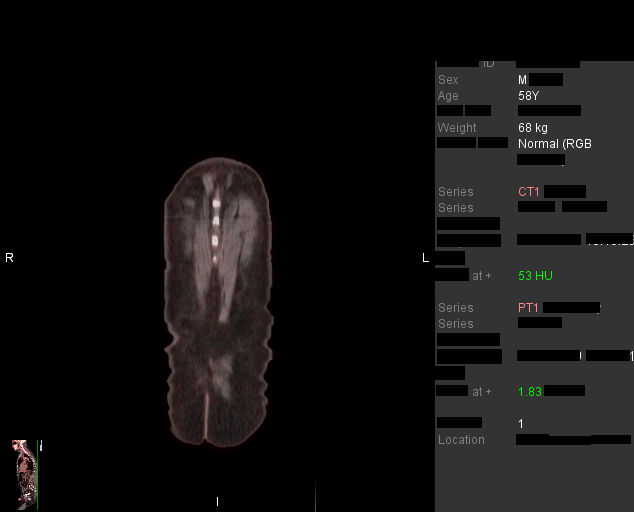

[25 of 52 positions shown; findings below may reference images not displayed]

FINDINGS: Mediastinal blood pool activity: SUV max

Liver activity: SUV max NA

NECK: No areas of abnormal hypermetabolism.

Incidental CT findings: Bilateral maxillary sinus mucous retention
cysts or polyps. Bilateral carotid atherosclerosis.

CHEST: Hypermetabolism corresponding to the previously described
cavitary left upper lobe lung mass. Example 3.9 x 4.0 cm and a
S.U.V. max of 17.1 on 96/3. Contiguous hypermetabolism along the
left chest wall, including involving the anterolateral left third
rib at a S.U.V. max of 5.4 on 98/3. No gross osseous destruction.

The AP window/prevascular node of 9 mm is mildly hypermetabolic
including at a S.U.V. max of 2.8 on 101/3.

Incidental CT findings: Aortic and coronary artery calcification.
Moderate centrilobular and mild paraseptal emphysema.

ABDOMEN/PELVIS: No abdominopelvic parenchymal or nodal
hypermetabolism.

Incidental CT findings: Normal adrenal glands. Mild hepatic
steatosis. Mild prostatomegaly.

SKELETON: No other abnormal marrow activity.

Incidental CT findings: Remote lower right rib fractures.
IMPRESSION: 1. Hypermetabolic left upper lobe lung mass, consistent with primary
bronchogenic carcinoma. Contiguous nonspecific hypermetabolism along
the adjacent anterior left chest wall, without gross soft tissue
mass or osseous involvement.
2. Prevascular/AP window node with mild hypermetabolism, suspicious
for nodal metastasis.
3. No evidence of extrathoracic hypermetabolic metastatic disease.
4. Incidental findings, including: Aortic atherosclerosis
([H4]-[H4]), coronary artery atherosclerosis and emphysema
([H4]-[H4]). Hepatic steatosis.

## 2020-12-14 MED ORDER — FLUDEOXYGLUCOSE F - 18 (FDG) INJECTION
9.6400 | Freq: Once | INTRAVENOUS | Status: AC | PRN
  Administered 2020-12-14: 9.64 via INTRAVENOUS

## 2020-12-16 ENCOUNTER — Other Ambulatory Visit: Payer: Self-pay

## 2020-12-16 ENCOUNTER — Other Ambulatory Visit (HOSPITAL_COMMUNITY): Payer: Self-pay

## 2020-12-16 ENCOUNTER — Telehealth: Payer: Self-pay | Admitting: Pulmonary Disease

## 2020-12-16 ENCOUNTER — Ambulatory Visit (HOSPITAL_COMMUNITY)
Admission: RE | Admit: 2020-12-16 | Discharge: 2020-12-16 | Disposition: A | Discharge status: Home / Self Care | Payer: Medicare PPO | Source: Ambulatory Visit | Attending: Hematology | Admitting: Hematology

## 2020-12-16 ENCOUNTER — Other Ambulatory Visit: Payer: Self-pay | Admitting: Emergency Medicine

## 2020-12-16 DIAGNOSIS — C3412 Malignant neoplasm of upper lobe, left bronchus or lung: Secondary | ICD-10-CM

## 2020-12-16 DIAGNOSIS — C3490 Malignant neoplasm of unspecified part of unspecified bronchus or lung: Secondary | ICD-10-CM | POA: Insufficient documentation

## 2020-12-16 DIAGNOSIS — J984 Other disorders of lung: Secondary | ICD-10-CM

## 2020-12-16 DIAGNOSIS — J341 Cyst and mucocele of nose and nasal sinus: Secondary | ICD-10-CM | POA: Diagnosis not present

## 2020-12-16 DIAGNOSIS — R9082 White matter disease, unspecified: Secondary | ICD-10-CM | POA: Diagnosis not present

## 2020-12-16 IMAGING — MR MR HEAD WO/W CM
13 of 15 series · 30 of 48 positions shown · IV contrast (gadavist)
Comparison: PET-CT [DATE]

CLINICAL DATA: 59-year-old male recently diagnosed with lung
cancer. Staging.

EXAM:
MRI HEAD WITHOUT AND WITH CONTRAST
TECHNIQUE: Multiplanar, multiecho pulse sequences of the brain and surrounding
structures were obtained without and with intravenous contrast.
CONTRAST:  7mL GADAVIST GADOBUTROL 1 MMOL/ML IV SOLN

[Series 5: DWI · axial · 4.0mm · 0.92mm/px · z∈[-78,+77]mm · 3 of 40 slices shown (1 of 4)]
[im 1/40]
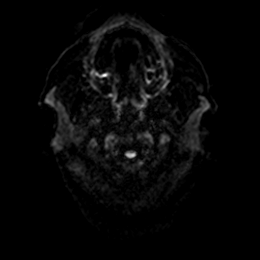
[im 20/40]
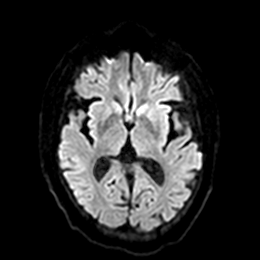
[im 40/40]
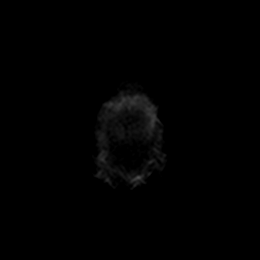

[Series 6: DWI · axial · 4.0mm · 0.92mm/px · z∈[-78,+77]mm · 3 of 40 slices shown (2 of 4)]
[im 1/40]
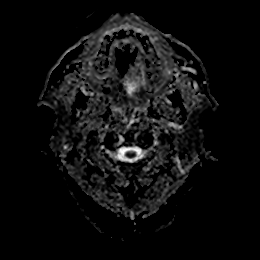
[im 20/40]
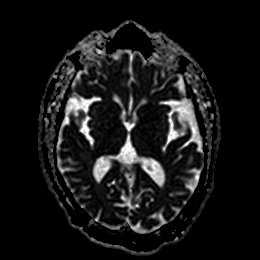
[im 40/40]
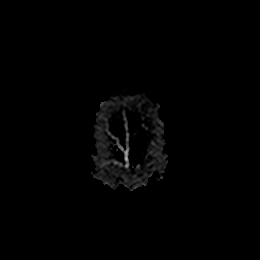

[Series 7: DWI · coronal · 5.0mm · 0.88mm/px · 3 of 34 slices shown (3 of 4)]
[im 1/34]
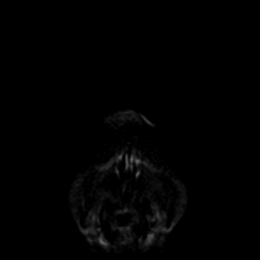
[im 17/34]
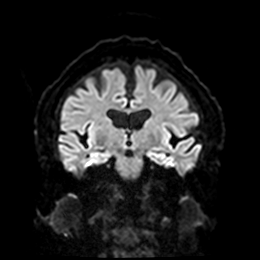
[im 34/34]
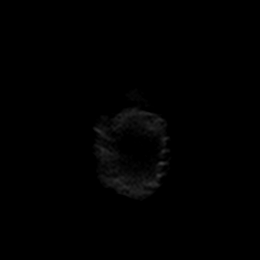

[Series 8: DWI · coronal · 5.0mm · 0.88mm/px · 2 of 34 slices shown (4 of 4)]
[im 1/34]
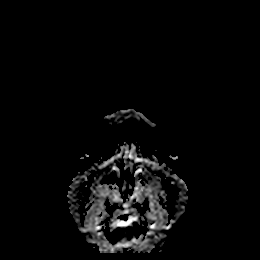
[im 34/34]
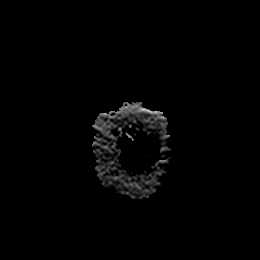

[Series 9: T1 · sagittal · 5.0mm · 0.78mm/px · 1 of 23 slices shown]
[im 1/23]
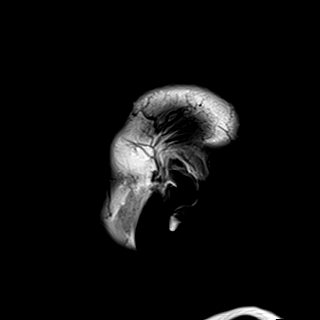

[Series 10: T2 · axial · 5.0mm · 0.75mm/px · 1 of 22 slices shown (1 of 2)]
[im 1/22]
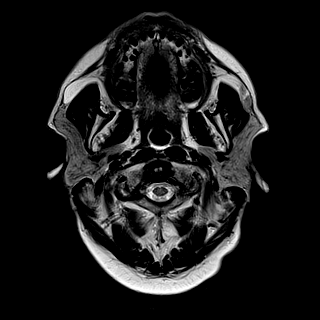

[Series 11: mag_images · axial · 3.0mm · 0.94mm/px · z∈[-72,+80]mm · 3 of 52 slices shown]
[im 1/52]
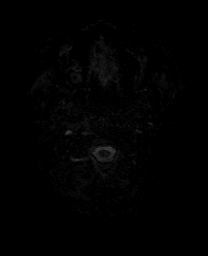
[im 26/52]
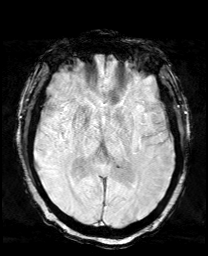
[im 52/52]
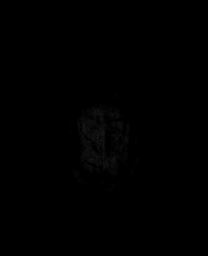

[Series 12: pha_images · axial · 3.0mm · 0.94mm/px · z∈[-72,+80]mm · 3 of 52 slices shown]
[im 1/52]
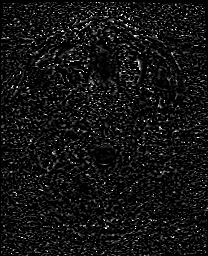
[im 26/52]
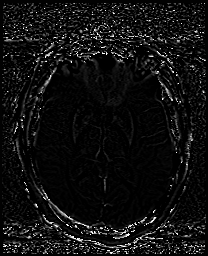
[im 52/52]
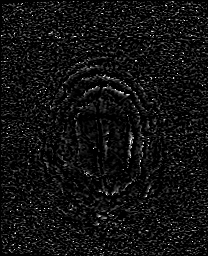

[Series 13: swi_images · axial · 3.0mm · 0.94mm/px · z∈[-72,+80]mm · 3 of 52 slices shown]
[im 1/52]
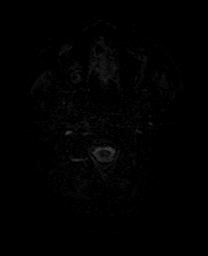
[im 26/52]
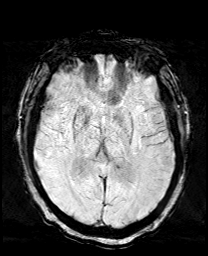
[im 52/52]
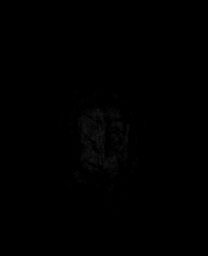

[Series 15: FLAIR · axial · 3.0mm · 0.45mm/px · z∈[-72,+75]mm · 3 of 50 slices shown]
[im 1/50]
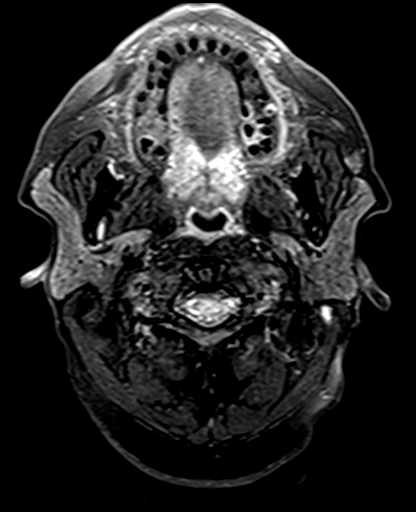
[im 25/50]
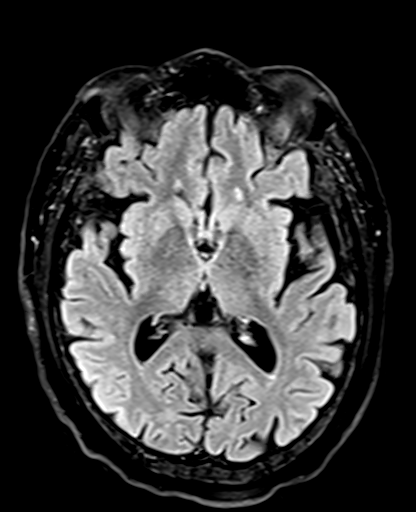
[im 50/50]
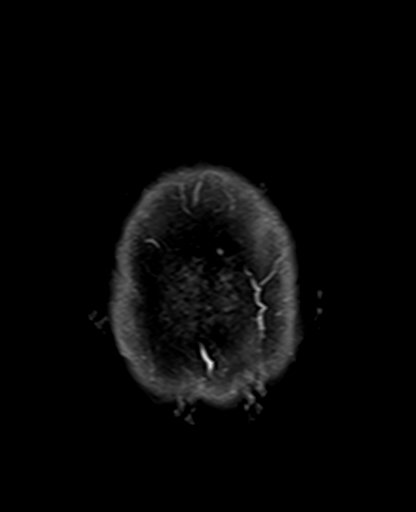

[Series 19: T1 post-contrast · coronal · 5.0mm · 0.34mm/px · 2 of 30 slices shown (1 of 2)]
[im 1/30]
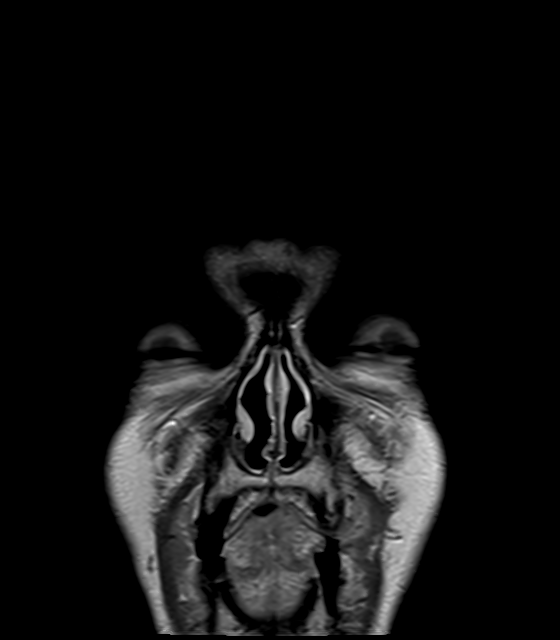
[im 30/30]
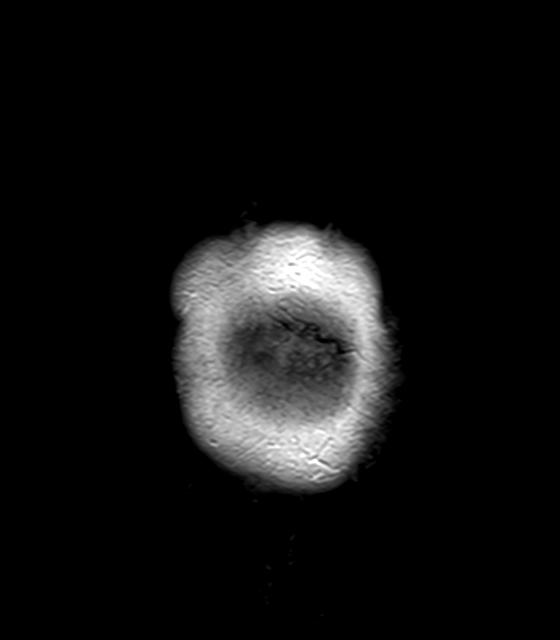

[Series 20: T2 · coronal · 5.0mm · 0.72mm/px · 2 of 32 slices shown (2 of 2)]
[im 1/32]
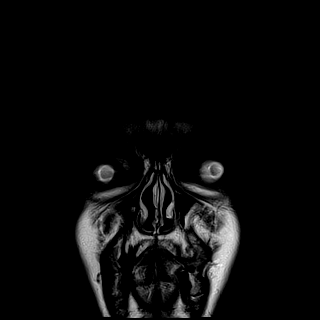
[im 32/32]
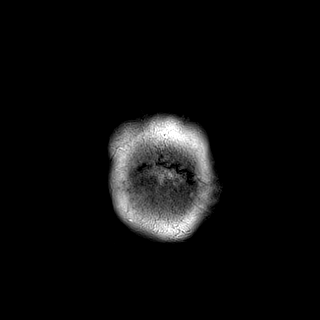

[Series 21: T1 post-contrast · sagittal · 5.0mm · 0.78mm/px · 1 of 21 slices shown (2 of 2)]
[im 1/21]
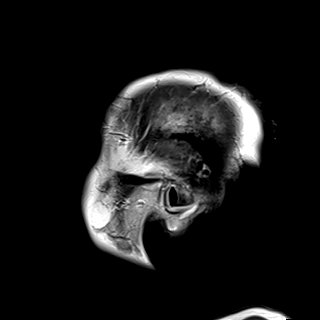

[30 of 48 positions shown; findings below may reference images not displayed]

FINDINGS: Brain: No midline shift, mass effect, or evidence of intracranial
mass lesion. No abnormal enhancement identified. No dural
thickening.

No restricted diffusion to suggest acute infarction. No
ventriculomegaly, extra-axial collection or acute intracranial
hemorrhage. Cervicomedullary junction and pituitary are within
normal limits. Scattered and occasionally patchy nonspecific
cerebral white matter T2 and FLAIR hyperintensity, with similar
signal heterogeneity in the pons. Deep gray matter nuclei and
cerebellum appear negative. No cortical encephalomalacia or chronic
cerebral blood products identified.

Vascular: Major intracranial vascular flow voids are preserved.
Major dural venous sinuses are enhancing and appear to be patent.

Skull and upper cervical spine: Bone marrow signal in the calvarium
is mildly heterogeneous but benign, with no destructive or
suspicious osseous lesion identified. Negative visible cervical
spine, spinal cord.

Sinuses/Orbits: Negative orbits. Bilateral maxillary sinus mucous
retention cysts.

Other: Visible internal auditory structures appear normal. Negative
visible scalp and face soft tissues.
IMPRESSION: 1. No metastatic disease or acute intracranial abnormality.
2. Mild to moderate for age nonspecific signal changes in the
cerebral white matter and pons, most commonly due to chronic small
vessel disease.

## 2020-12-16 MED ORDER — GADOBUTROL 1 MMOL/ML IV SOLN
7.0000 mL | Freq: Once | INTRAVENOUS | Status: AC | PRN
  Administered 2020-12-16: 7 mL via INTRAVENOUS

## 2020-12-16 NOTE — Progress Notes (Signed)
Discussed case w Dr Halford Chessman. Pt needs [name] for LUL mass. Working on setting up at [facility].

## 2020-12-16 NOTE — Telephone Encounter (Signed)
Working on Educational psychologist.

## 2020-12-16 NOTE — Telephone Encounter (Signed)
[name] is scheduled I have to schedule Ct then call the patient

## 2020-12-16 NOTE — Telephone Encounter (Signed)
Spoke with [name] over the phone about PET scan findings.  Explained he will need to get set up for bronchoscopy with [name].  Imaging has been reviewed with Dr. Lamonte Sakai, and Dr. Lamonte Sakai will perform procedure.  [name] is agreeable to have procedure done and he understands that the procedure will be done in [place] but other follow up with continue to be in [place].  Will route message to Dr. Lamonte Sakai to proceed with setting up bronchoscopy.

## 2020-12-17 ENCOUNTER — Inpatient Hospital Stay (HOSPITAL_COMMUNITY): Payer: Medicare PPO | Attending: Hematology | Admitting: Hematology

## 2020-12-17 ENCOUNTER — Telehealth: Payer: Self-pay | Admitting: Pulmonary Disease

## 2020-12-17 ENCOUNTER — Inpatient Hospital Stay (HOSPITAL_COMMUNITY): Payer: Medicare PPO

## 2020-12-17 VITALS — BP 154/62 | HR 94 | Temp 97.2°F | Resp 20 | Wt 158.4 lb

## 2020-12-17 DIAGNOSIS — Z801 Family history of malignant neoplasm of trachea, bronchus and lung: Secondary | ICD-10-CM | POA: Diagnosis not present

## 2020-12-17 DIAGNOSIS — Z832 Family history of diseases of the blood and blood-forming organs and certain disorders involving the immune mechanism: Secondary | ICD-10-CM | POA: Insufficient documentation

## 2020-12-17 DIAGNOSIS — J984 Other disorders of lung: Secondary | ICD-10-CM | POA: Insufficient documentation

## 2020-12-17 DIAGNOSIS — R599 Enlarged lymph nodes, unspecified: Secondary | ICD-10-CM | POA: Diagnosis not present

## 2020-12-17 DIAGNOSIS — R5383 Other fatigue: Secondary | ICD-10-CM | POA: Diagnosis not present

## 2020-12-17 DIAGNOSIS — F17210 Nicotine dependence, cigarettes, uncomplicated: Secondary | ICD-10-CM | POA: Diagnosis not present

## 2020-12-17 DIAGNOSIS — I6523 Occlusion and stenosis of bilateral carotid arteries: Secondary | ICD-10-CM | POA: Diagnosis not present

## 2020-12-17 DIAGNOSIS — C3412 Malignant neoplasm of upper lobe, left bronchus or lung: Secondary | ICD-10-CM

## 2020-12-17 DIAGNOSIS — Z8249 Family history of ischemic heart disease and other diseases of the circulatory system: Secondary | ICD-10-CM | POA: Diagnosis not present

## 2020-12-17 DIAGNOSIS — Z836 Family history of other diseases of the respiratory system: Secondary | ICD-10-CM | POA: Diagnosis not present

## 2020-12-17 DIAGNOSIS — Z833 Family history of diabetes mellitus: Secondary | ICD-10-CM | POA: Diagnosis not present

## 2020-12-17 DIAGNOSIS — I700 Atherosclerosis of aorta: Secondary | ICD-10-CM | POA: Diagnosis not present

## 2020-12-17 DIAGNOSIS — J432 Centrilobular emphysema: Secondary | ICD-10-CM | POA: Diagnosis not present

## 2020-12-17 DIAGNOSIS — I2510 Atherosclerotic heart disease of native coronary artery without angina pectoris: Secondary | ICD-10-CM | POA: Diagnosis not present

## 2020-12-17 DIAGNOSIS — N400 Enlarged prostate without lower urinary tract symptoms: Secondary | ICD-10-CM | POA: Insufficient documentation

## 2020-12-17 DIAGNOSIS — K760 Fatty (change of) liver, not elsewhere classified: Secondary | ICD-10-CM | POA: Diagnosis not present

## 2020-12-17 DIAGNOSIS — Z79899 Other long term (current) drug therapy: Secondary | ICD-10-CM | POA: Diagnosis not present

## 2020-12-17 DIAGNOSIS — Z7289 Other problems related to lifestyle: Secondary | ICD-10-CM | POA: Diagnosis not present

## 2020-12-17 DIAGNOSIS — R200 Anesthesia of skin: Secondary | ICD-10-CM | POA: Insufficient documentation

## 2020-12-17 MED ORDER — NICOTINE 21 MG/24HR TD PT24: 21.0000 mg | MEDICATED_PATCH | Freq: Every day | TRANSDERMAL | 0 refills | Status: DC

## 2020-12-17 NOTE — Progress Notes (Signed)
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    CANCER STAGING: Cancer Staging No matching staging information was found for the patient.  INTERVAL HISTORY:  [name], a 60 y.o. male, returns for routine follow-up of his cavitating mass in left upper lobe of lung. Bryan Fleming was last seen on 12/03/2020.   Today he reports feeling fair. He is scheduled for a video bronchoscopy with Dr. Lamonte Sakai. He denies pain in left chest. He is currently taking welbutrin twice daily, but it is not helping his smoking cessation; he expresses a desire to quite smoking.    REVIEW OF SYSTEMS:  Review of Systems  Constitutional: Positive for appetite change (50%) and fatigue (50%).  Respiratory: Positive for cough and shortness of breath.   Cardiovascular: Negative for chest pain.  Gastrointestinal: Positive for diarrhea.  Neurological: Positive for numbness.  Psychiatric/Behavioral: Positive for depression and sleep disturbance.    PAST MEDICAL/SURGICAL HISTORY:  Past Medical History:  Diagnosis Date  . COPD (chronic obstructive pulmonary disease) ([facility]) 2007   EMPHYSEMA  . GERD (gastroesophageal reflux disease) 2010  . Hepatitis    Alcoholic   . Hypertension    Past Surgical History:  Procedure Laterality Date  . CARDIAC CATHETERIZATION     4-5 YEARS AGO  . COLONOSCOPY  03/14/2012   Procedure: COLONOSCOPY;  Surgeon: Rogene Houston, MD;  Location: [facility];  Service: Endoscopy;  Laterality: N/A;  100  . WRIST GANGLION EXCISION  1985    SOCIAL HISTORY:  Social History   Socioeconomic History  .  Marital status: Widowed    Spouse name: Not on file  . Number of children: Not on file  . Years of education: Not on file  . Highest education level: Not on file  Occupational History  . Not on file  Tobacco Use  . Smoking status: Current Every Day Smoker    Packs/day: 1.00    Years: 37.00    Pack years: 37.00    Types: Cigarettes  . Smokeless tobacco: Never Used  . Tobacco comment: smokes 1.5 per day 12/01/2020  Vaping Use  . Vaping Use: Never used  Substance and Sexual Activity  . Alcohol use: Yes    Alcohol/week: 22.0 standard drinks    Types: 21 Cans of beer, 1 Standard drinks or equivalent per week    Comment: daily  . Drug use: No  . Sexual activity: Yes  Other Topics Concern  . Not on file  Social History Narrative  . Not on file   Social Determinants of Health   Financial Resource Strain: Not on file  Food Insecurity: Not on file  Transportation Needs: No Transportation Needs  . Lack of Transportation (Medical): No  . Lack of Transportation (Non-Medical): No  Physical Activity: Inactive  . Days of Exercise per Week: 0 days  . Minutes of Exercise per Session: 0 min  Stress: Not on file  Social Connections: Not on file  Intimate Partner Violence: Not At Risk  . Fear of Current or Ex-Partner: No  . Emotionally Abused: No  . Physically Abused: No  . Sexually  Abused: No    FAMILY HISTORY:  Family History  Problem Relation Age of Onset  . Hypertension Mother   . Asthma Father   . Diabetes Sister   . Hypertension Sister   . Hypertension Brother   . Hypertension Sister   . Lupus Daughter     CURRENT MEDICATIONS:  Current Outpatient Medications  Medication Sig Dispense Refill  . alum & mag hydroxide-simeth (MAALOX/MYLANTA) 200-200-20 MG/5ML suspension Take 30 mLs by mouth every 6 (six) hours as needed for indigestion or heartburn.    Marland Kitchen amLODipine (NORVASC) 5 MG tablet Take 5 mg by mouth daily.    Marland Kitchen aspirin 81 MG tablet Take 81 mg by mouth daily.    Marland Kitchen  bismuth subsalicylate (PEPTO BISMOL) 262 MG chewable tablet Chew 524 mg by mouth 3 (three) times daily as needed for indigestion.     Marland Kitchen buPROPion (WELLBUTRIN SR) 150 MG 12 hr tablet Take 1 tablet (150 mg total) by mouth 2 (two) times daily. 60 tablet 4  . diphenhydrAMINE (BENADRYL) 25 MG tablet Take 25 mg by mouth daily as needed for itching or allergies.     . folic acid (FOLVITE) 1 MG tablet Take 1 tablet (1 mg total) by mouth daily. 30 tablet 1  . loperamide (IMODIUM A-D) 2 MG tablet Take 2 mg by mouth daily as needed for diarrhea or loose stools.    . metoprolol tartrate (LOPRESSOR) 25 MG tablet Take 0.5 tablets (12.5 mg total) by mouth 2 (two) times daily. 30 tablet 0  . Multiple Vitamin (MULTIVITAMIN WITH MINERALS) TABS tablet Take 1 tablet by mouth daily.    . nicotine (NICODERM CQ - DOSED IN MG/24 HOURS) 21 mg/24hr patch Place 1 patch (21 mg total) onto the skin daily. 28 patch 0  . oxymetazoline (AFRIN) 0.05 % nasal spray Place 2 sprays into both nostrils daily as needed for congestion.    Marland Kitchen PROVENTIL HFA 108 (90 Base) MCG/ACT inhaler INHALE 2 PUFFS BY MOUTH EVERY 6 HOURS AS NEEDED FOR COUGHING, WHEEZING, OR SHORTNESS OF BREATH (Patient taking differently: Inhale 2 puffs into the lungs every 6 (six) hours as needed for wheezing or shortness of breath (or coughing).) 20.1 g 1  . SYMBICORT 160-4.5 MCG/ACT inhaler Inhale 2 puffs into the lungs in the morning and at bedtime.    . thiamine 100 MG tablet Take 1 tablet (100 mg total) by mouth daily. 30 tablet 2  . vitamin B-12 (CYANOCOBALAMIN) 500 MCG tablet Take 500 mcg by mouth daily.     No current facility-administered medications for this visit.    ALLERGIES:  Allergies  Allergen Reactions  . Other Nausea Only and Other (See Comments)    No spicy foods- Patient cannot tolerate- HEARTBURN    PHYSICAL EXAM:  Performance status (ECOG): 1 - Symptomatic but completely ambulatory  There were no vitals filed for this visit. Wt Readings  from Last 3 Encounters:  12/03/20 158 lb 9.6 oz (71.9 kg)  12/01/20 159 lb 3.2 oz (72.2 kg)  06/04/20 145 lb (65.8 kg)   Physical Exam Vitals reviewed.  Constitutional:      Appearance: Normal appearance.  Cardiovascular:     Rate and Rhythm: Normal rate and regular rhythm.     Pulses: Normal pulses.     Heart sounds: Normal heart sounds.  Pulmonary:     Effort: Pulmonary effort is normal.     Breath sounds: Examination of the right-upper field reveals wheezing. Examination of the left-upper field reveals wheezing. Examination of  the right-middle field reveals wheezing. Examination of the left-middle field reveals wheezing. Examination of the right-lower field reveals wheezing. Examination of the left-lower field reveals wheezing. Wheezing present.  Neurological:     General: No focal deficit present.     Mental Status: He is alert and oriented to person, place, and time.  Psychiatric:        Mood and Affect: Mood normal.        Behavior: Behavior normal.      LABORATORY DATA:  I have reviewed the labs as listed.  CBC Latest Ref Rng & Units 06/05/2020 06/04/2020 12/06/2019  WBC 4.0 - 10.5 K/uL 7.4 10.9(H) 11.4(H)  Hemoglobin 13.0 - 17.0 g/dL 13.6 16.3 17.2(H)  Hematocrit 39.0 - 52.0 % 42.2 48.2 52.9(H)  Platelets 150 - 400 K/uL 171 232 344   CMP Latest Ref Rng & Units 06/05/2020 06/04/2020 12/06/2019  Glucose 70 - 99 mg/dL 106(H) 203(H) 102(H)  BUN 6 - 20 mg/dL 5(L) 5(L) 15  Creatinine 0.61 - 1.24 mg/dL 0.61 0.73 0.88  Sodium 135 - 145 mmol/L 133(L) 135 136  Potassium 3.5 - 5.1 mmol/L 4.4 3.1(L) 3.9  Chloride 98 - 111 mmol/L 99 88(L) 97(L)  CO2 22 - 32 mmol/L 26 30 25   Calcium 8.9 - 10.3 mg/dL 8.8(L) 9.4 10.0  Total Protein 6.5 - 8.1 g/dL 6.7 8.6(H) 8.3(H)  Total Bilirubin 0.3 - 1.2 mg/dL 1.1 1.1 2.1(H)  Alkaline Phos 38 - 126 U/L 63 88 78  AST 15 - 41 U/L 28 69(H) 21  ALT 0 - 44 U/L 20 32 15    DIAGNOSTIC IMAGING:  I have independently reviewed the scans and discussed  with the patient. MR Brain W Wo Contrast  Result Date: 12/17/2020 CLINICAL DATA:  60 year old male recently diagnosed with lung cancer. Staging. EXAM: MRI HEAD WITHOUT AND WITH CONTRAST TECHNIQUE: Multiplanar, multiecho pulse sequences of the brain and surrounding structures were obtained without and with intravenous contrast. CONTRAST:  24mL GADAVIST GADOBUTROL 1 MMOL/ML IV SOLN COMPARISON:  PET-CT 12/14/2020 FINDINGS: Brain: No midline shift, mass effect, or evidence of intracranial mass lesion. No abnormal enhancement identified. No dural thickening. No restricted diffusion to suggest acute infarction. No ventriculomegaly, extra-axial collection or acute intracranial hemorrhage. Cervicomedullary junction and pituitary are within normal limits. Scattered and occasionally patchy nonspecific cerebral white matter T2 and FLAIR hyperintensity, with similar signal heterogeneity in the pons. Deep gray matter nuclei and cerebellum appear negative. No cortical encephalomalacia or chronic cerebral blood products identified. Vascular: Major intracranial vascular flow voids are preserved. Major dural venous sinuses are enhancing and appear to be patent. Skull and upper cervical spine: Bone marrow signal in the calvarium is mildly heterogeneous but benign, with no destructive or suspicious osseous lesion identified. Negative visible cervical spine, spinal cord. Sinuses/Orbits: Negative orbits. Bilateral maxillary sinus mucous retention cysts. Other: Visible internal auditory structures appear normal. Negative visible scalp and face soft tissues. IMPRESSION: 1. No metastatic disease or acute intracranial abnormality. 2. Mild to moderate for age nonspecific signal changes in the cerebral white matter and pons, most commonly due to chronic small vessel disease. Electronically Signed   By: Genevie Ann M.D.   On: 12/17/2020 05:39   NM PET Image Initial (PI) Skull Base To Thigh  Result Date: 12/15/2020 CLINICAL DATA:  Initial  treatment strategy for left upper lobe cavitary lung mass on lung cancer screening CT. Borderline AP window adenopathy. EXAM: NUCLEAR MEDICINE PET SKULL BASE TO THIGH TECHNIQUE: 9.6 mCi F-18 FDG was injected intravenously. Full-ring  PET imaging was performed from the skull base to thigh after the radiotracer. CT data was obtained and used for attenuation correction and anatomic localization. Fasting blood glucose: 90 mg/dl COMPARISON:  Chest CT of 10/06/2020.  Abdominopelvic CT 06/04/2020 FINDINGS: Mediastinal blood pool activity: SUV max 2.1 Liver activity: SUV max NA NECK: No areas of abnormal hypermetabolism. Incidental CT findings: Bilateral maxillary sinus mucous retention cysts or polyps. Bilateral carotid atherosclerosis. CHEST: Hypermetabolism corresponding to the previously described cavitary left upper lobe lung mass. Example 3.9 x 4.0 cm and a S.U.V. max of 17.1 on 96/3. Contiguous hypermetabolism along the left chest wall, including involving the anterolateral left third rib at a S.U.V. max of 5.4 on 98/3. No gross osseous destruction. The AP window/prevascular node of 9 mm is mildly hypermetabolic including at a S.U.V. max of 2.8 on 101/3. Incidental CT findings: Aortic and coronary artery calcification. Moderate centrilobular and mild paraseptal emphysema. ABDOMEN/PELVIS: No abdominopelvic parenchymal or nodal hypermetabolism. Incidental CT findings: Normal adrenal glands. Mild hepatic steatosis. Mild prostatomegaly. SKELETON: No other abnormal marrow activity. Incidental CT findings: Remote lower right rib fractures. IMPRESSION: 1. Hypermetabolic left upper lobe lung mass, consistent with primary bronchogenic carcinoma. Contiguous nonspecific hypermetabolism along the adjacent anterior left chest wall, without gross soft tissue mass or osseous involvement. 2. Prevascular/AP window node with mild hypermetabolism, suspicious for nodal metastasis. 3. No evidence of extrathoracic hypermetabolic metastatic  disease. 4. Incidental findings, including: Aortic atherosclerosis (ICD10-I70.0), coronary artery atherosclerosis and emphysema (ICD10-J43.9). Hepatic steatosis. Electronically Signed   By: Abigail Miyamoto M.D.   On: 12/15/2020 12:45     ASSESSMENT:  1.  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 Halford Chessman for left upper lobe lung mass. - Is current active smoker, 1/2 pack/day for 35 years. - CT lung cancer screening scan on 10/16/2020 showed cavitary mass in the anterior left upper lobe measuring 4.4 x 3.0 x 3.5 cm with 2.5 cm solid nodular component inferomedially.  This abuts and involves the anterior pleura/chest wall.  Expansile lytic lesion in the right posterolateral ninth and 10th ribs (unchanged dating back to CT from 06/16/2016).  10 mm short axis AP window node suspicious for nodal metastasis. - No change in baseline cough.  No hemoptysis.  No chest pain.  2.  Social/family history: - Previously worked in [employer], currently on disability. - Current active smoker, 1/2 pack/day for 35 years. - Drinks 6 pack of beers along with 1 quart of liquor every day. - 1 brother had throat cancer and another brother had lung cancer.   PLAN:  1.  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 - We reviewed images of the PET scan from 12/14/2020 which showed left upper lobe mass measuring 3.9 x 4.0 cm with SUV 17.1.  There is some contiguous hypermetabolism along the left chest wall including the anterolateral left third rib and SUV max of 5.4.  No gross osseous destruction.  AP window node of 9 mm is mildly hypermetabolic, SUV 2.8. - No other metastatic disease. - He is scheduled for bronchoscopy and biopsy by Dr. Lamonte Sakai on 12/23/2020. - I will also make a referral to Dr. Roxan Hockey for surgical evaluation. - I have discussed with his daughter Phineas Real on the phone. - RTC 2 weeks to discuss biopsy  results.  2.  Smoking cessation: - He is using Wellbutrin which is not completely helping. - We will add nicotine  patch 21 mg.   Orders placed this encounter:  No orders of the defined types were placed in this encounter.    Derek Jack, MD [place] [phone]   I, Thana Ates, am acting as a scribe for Dr. Sanda Linger.  I, Derek Jack MD, have reviewed the above documentation for accuracy and completeness, and I agree with the above.

## 2020-12-17 NOTE — Telephone Encounter (Signed)
Routed message to [facility], due to patient returning call to schedule procedures and CT. [address] [facility] acknowledged she got phone message.

## 2020-12-17 NOTE — Telephone Encounter (Signed)
I have spoke to the patient and got him scheduled

## 2020-12-17 NOTE — Patient Instructions (Addendum)
[facility] Discharge Instructions  You were seen today by Dr. Delton Coombes. He went over your recent results. Continue taking Welbutrin twice daily, and your will be prescribed a nicotine patch. Keep your appointment with Dr. Lamonte Sakai. You will be referred to a cardiothoracic surgeon, Dr. Blase Mess. Dr. Delton Coombes will see you back in 2 weeks for follow up.   Thank you for choosing [place] at [facility] to provide your oncology and hematology care.  To afford each patient quality time with our provider, please arrive at least 15 minutes before your scheduled appointment time.   If you have a lab appointment with the [facility] please come in thru the Main Entrance and check in at the main information desk  You need to re-schedule your appointment should you arrive 10 or more minutes late.  We strive to give you quality time with our providers, and arriving late affects you and other patients whose appointments are after yours.  Also, if you no show three or more times for appointments you may be dismissed from the clinic at the providers discretion.     Again, thank you for choosing [facility].  Our hope is that these requests will decrease the amount of time that you wait before being seen by our physicians.       _____________________________________________________________  Should you have questions after your visit to [facility], please contact our office at [phone] between the hours of 8:00 a.m. and 4:30 p.m.  Voicemails left after 4:00 p.m. will not be returned until the following business day.  For prescription refill requests, have your pharmacy contact our office and allow 72 hours.    Cancer Center Support Programs:   > Cancer Support Group  2nd Tuesday of the month 1pm-2pm, Journey Room

## 2020-12-17 NOTE — Telephone Encounter (Signed)
[name] scheduled 12/23/2020 @ 10:30a  CT 12/21/20 @ 2 LBCT Covid test 12/21/20 pt is aware to also stop his asprin 2 days prior

## 2020-12-18 ENCOUNTER — Inpatient Hospital Stay (HOSPITAL_COMMUNITY): Admit: 2020-12-18 | Payer: Medicare PPO

## 2020-12-18 ENCOUNTER — Inpatient Hospital Stay (HOSPITAL_COMMUNITY): Payer: Medicare PPO

## 2020-12-18 ENCOUNTER — Other Ambulatory Visit: Payer: Self-pay

## 2020-12-18 DIAGNOSIS — R200 Anesthesia of skin: Secondary | ICD-10-CM | POA: Diagnosis not present

## 2020-12-18 DIAGNOSIS — R599 Enlarged lymph nodes, unspecified: Secondary | ICD-10-CM | POA: Diagnosis not present

## 2020-12-18 DIAGNOSIS — I2510 Atherosclerotic heart disease of native coronary artery without angina pectoris: Secondary | ICD-10-CM | POA: Diagnosis not present

## 2020-12-18 DIAGNOSIS — R5383 Other fatigue: Secondary | ICD-10-CM | POA: Diagnosis not present

## 2020-12-18 DIAGNOSIS — C3412 Malignant neoplasm of upper lobe, left bronchus or lung: Secondary | ICD-10-CM | POA: Diagnosis not present

## 2020-12-18 DIAGNOSIS — J432 Centrilobular emphysema: Secondary | ICD-10-CM | POA: Diagnosis not present

## 2020-12-18 DIAGNOSIS — K760 Fatty (change of) liver, not elsewhere classified: Secondary | ICD-10-CM | POA: Diagnosis not present

## 2020-12-18 DIAGNOSIS — I700 Atherosclerosis of aorta: Secondary | ICD-10-CM | POA: Diagnosis not present

## 2020-12-18 DIAGNOSIS — I6523 Occlusion and stenosis of bilateral carotid arteries: Secondary | ICD-10-CM | POA: Diagnosis not present

## 2020-12-18 LAB — CBC WITH DIFFERENTIAL/PLATELET
Abs Immature Granulocytes: 0.03 10*3/uL (ref 0.00–0.07)
Basophils Absolute: 0.1 10*3/uL (ref 0.0–0.1)
Basophils Relative: 1 %
Eosinophils Absolute: 0 10*3/uL (ref 0.0–0.5)
Eosinophils Relative: 0 %
HCT: 36 % — ABNORMAL LOW (ref 39.0–52.0)
Hemoglobin: 11.4 g/dL — ABNORMAL LOW (ref 13.0–17.0)
Immature Granulocytes: 0 %
Lymphocytes Relative: 24 %
Lymphs Abs: 1.9 10*3/uL (ref 0.7–4.0)
MCH: 28.3 pg (ref 26.0–34.0)
MCHC: 31.7 g/dL (ref 30.0–36.0)
MCV: 89.3 fL (ref 80.0–100.0)
Monocytes Absolute: 1 10*3/uL (ref 0.1–1.0)
Monocytes Relative: 13 %
Neutro Abs: 4.9 10*3/uL (ref 1.7–7.7)
Neutrophils Relative %: 62 %
Platelets: 264 10*3/uL (ref 150–400)
RBC: 4.03 MIL/uL — ABNORMAL LOW (ref 4.22–5.81)
RDW: 15.5 % (ref 11.5–15.5)
WBC: 7.8 10*3/uL (ref 4.0–10.5)
nRBC: 0 % (ref 0.0–0.2)

## 2020-12-18 LAB — COMPREHENSIVE METABOLIC PANEL
ALT: 42 U/L (ref 0–44)
AST: 91 U/L — ABNORMAL HIGH (ref 15–41)
Albumin: 4.1 g/dL (ref 3.5–5.0)
Alkaline Phosphatase: 80 U/L (ref 38–126)
Anion gap: 10 (ref 5–15)
BUN: 8 mg/dL (ref 6–20)
CO2: 23 mmol/L (ref 22–32)
Calcium: 9.3 mg/dL (ref 8.9–10.3)
Chloride: 103 mmol/L (ref 98–111)
Creatinine, Ser: 0.61 mg/dL (ref 0.61–1.24)
GFR, Estimated: >60 mL/min (ref >60)
Glucose, Bld: 114 mg/dL — ABNORMAL HIGH (ref 70–99)
Potassium: 4.1 mmol/L (ref 3.5–5.1)
Sodium: 136 mmol/L (ref 135–145)
Total Bilirubin: 0.5 mg/dL (ref 0.3–1.2)
Total Protein: 8.6 g/dL — ABNORMAL HIGH (ref 6.5–8.1)

## 2020-12-18 LAB — PROTIME-INR
INR: 1.1 (ref 0.8–1.2)
Prothrombin Time: 13.7 seconds (ref 11.4–15.2)

## 2020-12-21 ENCOUNTER — Other Ambulatory Visit: Payer: Self-pay

## 2020-12-21 ENCOUNTER — Ambulatory Visit (INDEPENDENT_AMBULATORY_CARE_PROVIDER_SITE_OTHER)
Admission: RE | Admit: 2020-12-21 | Discharge: 2020-12-21 | Disposition: A | Discharge status: Home / Self Care | Payer: Medicare PPO | Source: Ambulatory Visit | Attending: Emergency Medicine | Admitting: Emergency Medicine

## 2020-12-21 ENCOUNTER — Encounter (HOSPITAL_COMMUNITY): Payer: Self-pay | Admitting: Emergency Medicine

## 2020-12-21 ENCOUNTER — Other Ambulatory Visit (HOSPITAL_COMMUNITY)
Admission: RE | Admit: 2020-12-21 | Discharge: 2020-12-21 | Disposition: A | Discharge status: Home / Self Care | Payer: Medicare PPO | Source: Ambulatory Visit | Attending: Emergency Medicine | Admitting: Emergency Medicine

## 2020-12-21 DIAGNOSIS — J432 Centrilobular emphysema: Secondary | ICD-10-CM | POA: Diagnosis not present

## 2020-12-21 DIAGNOSIS — J984 Other disorders of lung: Secondary | ICD-10-CM | POA: Diagnosis not present

## 2020-12-21 DIAGNOSIS — Z01812 Encounter for preprocedural laboratory examination: Secondary | ICD-10-CM | POA: Diagnosis not present

## 2020-12-21 DIAGNOSIS — Z20822 Contact with and (suspected) exposure to covid-19: Secondary | ICD-10-CM | POA: Insufficient documentation

## 2020-12-21 DIAGNOSIS — I2510 Atherosclerotic heart disease of native coronary artery without angina pectoris: Secondary | ICD-10-CM | POA: Diagnosis not present

## 2020-12-21 DIAGNOSIS — J929 Pleural plaque without asbestos: Secondary | ICD-10-CM | POA: Diagnosis not present

## 2020-12-21 IMAGING — CT CT CHEST SUPER D W/O CM
2 of 5 series · 14 of 36 positions shown, 17 images · non-contrast
Comparison: Chest CT [DATE].  PET-CT [DATE].

CLINICAL DATA: 59-year-old male with history of cavitating left
upper lobe mass presenting with productive cough for 1 year.

EXAM:
CT CHEST WITHOUT CONTRAST
TECHNIQUE: Multidetector CT imaging of the chest was performed using thin slice
collimation for electromagnetic bronchoscopy planning purposes,
without intravenous contrast.

[Series 4: thins · axial · 0.67mm/px · z∈[-306,-31]mm · 11 of 398 slices shown, 14 images]
[im 27/398  mediastinal]
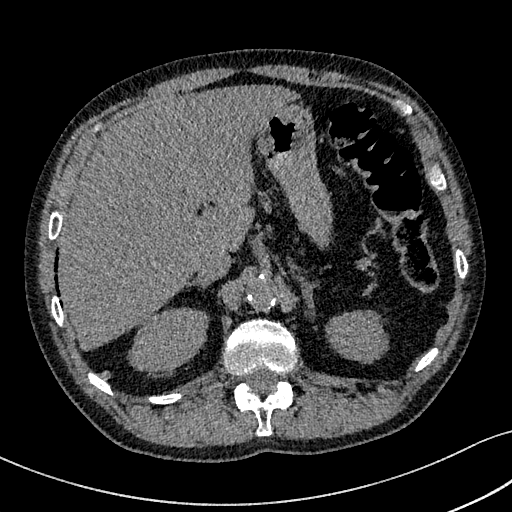
[im 27/398  lung]
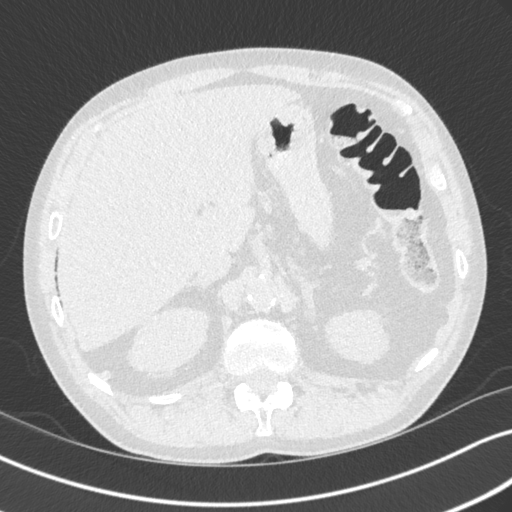
[im 53/398  lung]
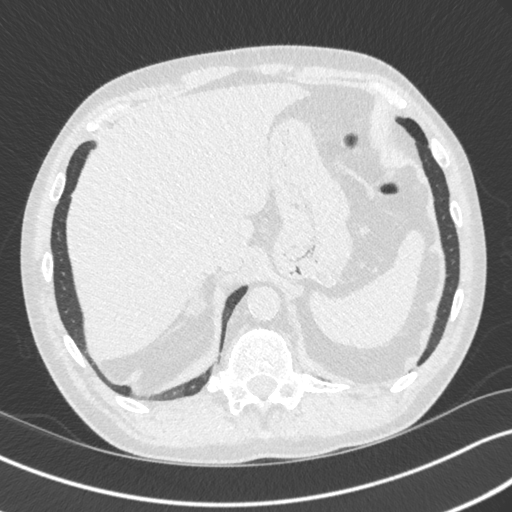
[im 106/398  lung]
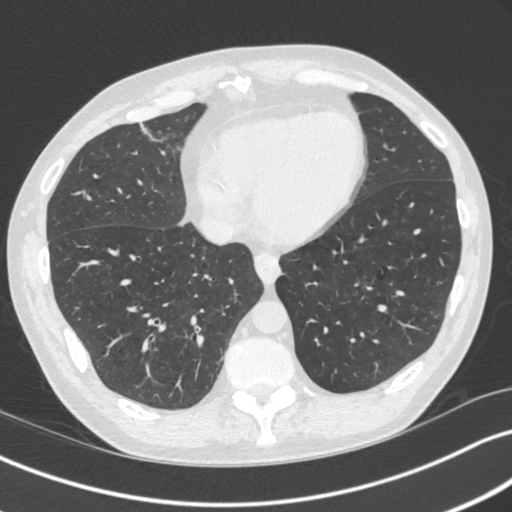
[im 133/398  lung]
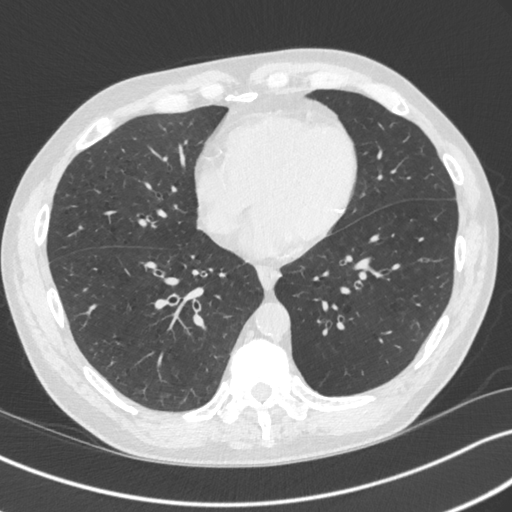
[im 159/398  mediastinal]
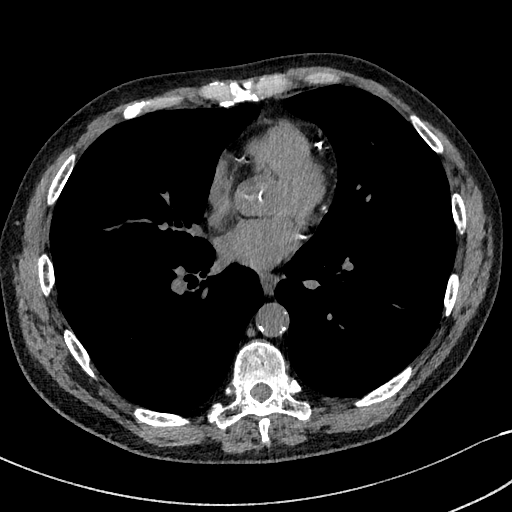
[im 159/398  lung]
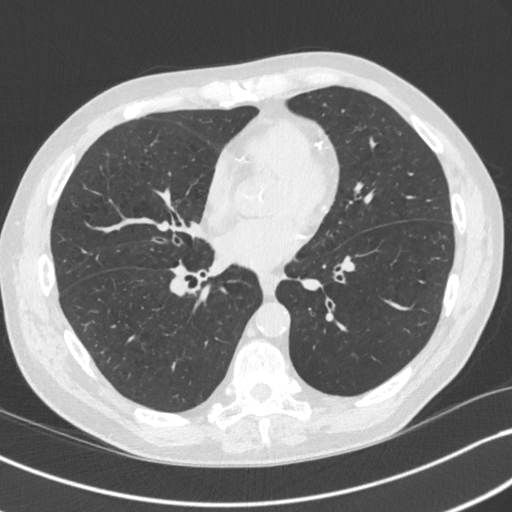
[im 212/398  lung]
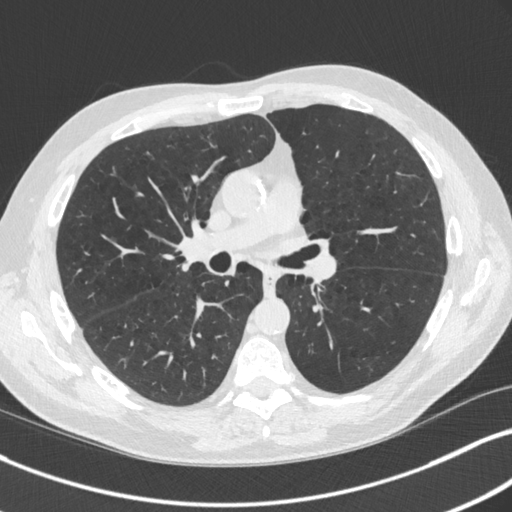
[im 239/398  lung]
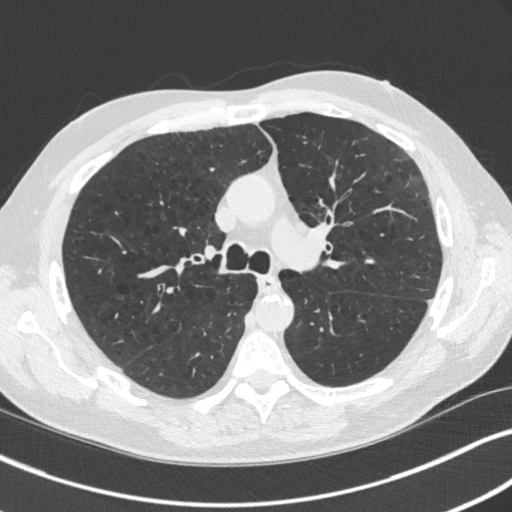
[im 265/398  lung]
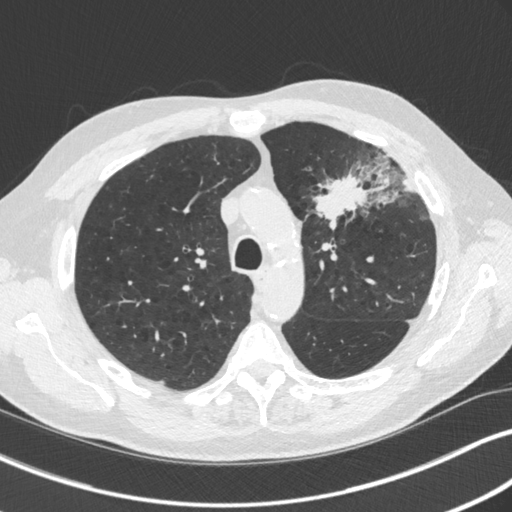
[im 292/398  mediastinal]
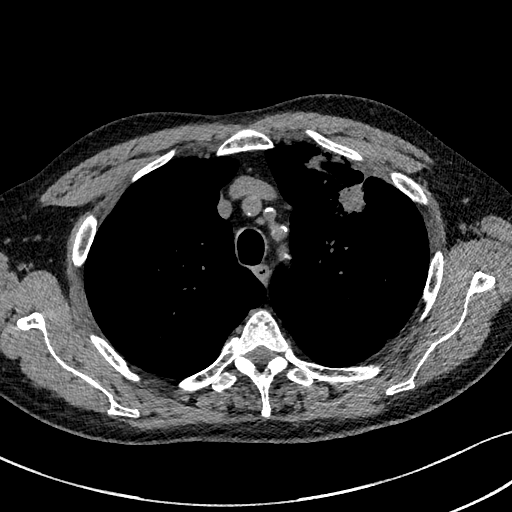
[im 292/398  lung]
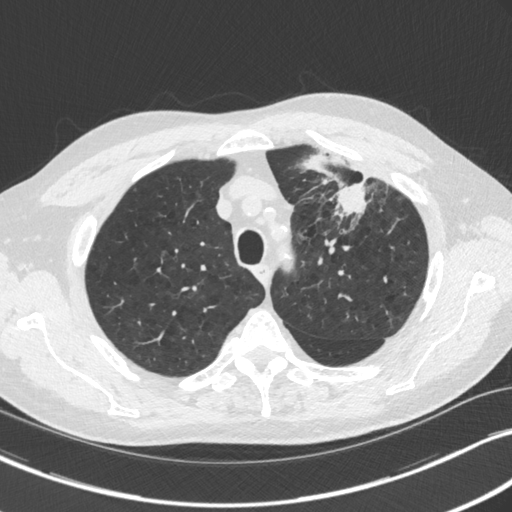
[im 345/398  lung]
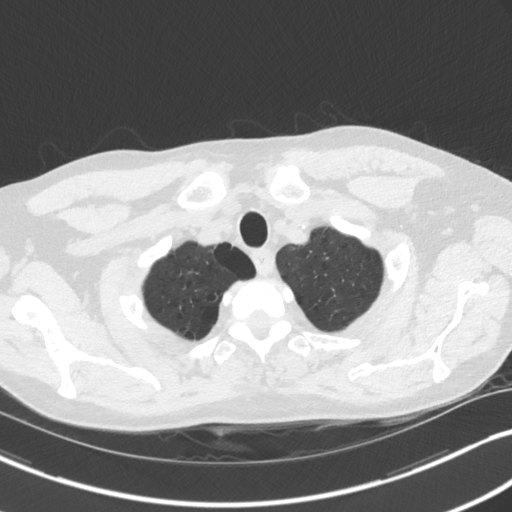
[im 371/398  lung]
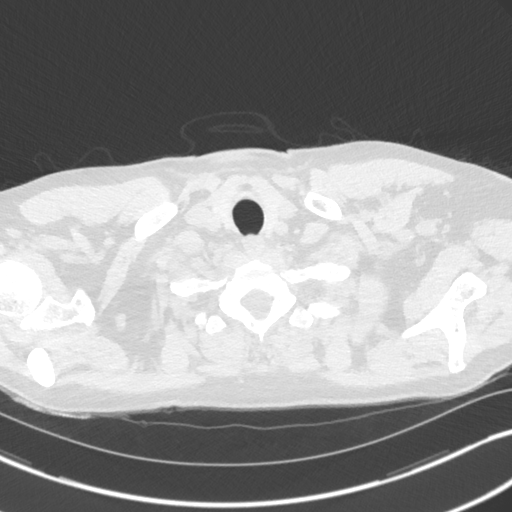

[Series 5: coronal · coronal · 0.62mm/px · 3 of 84 slices shown]
[im 17/84  lung]
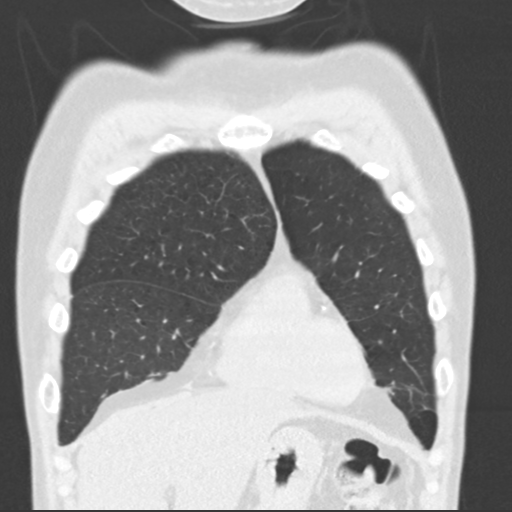
[im 34/84  lung]
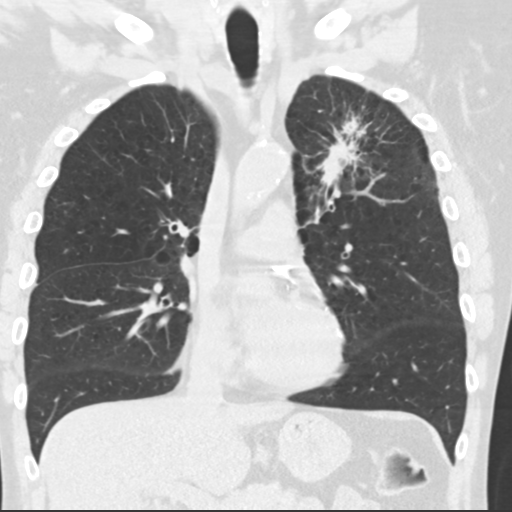
[im 50/84  lung]
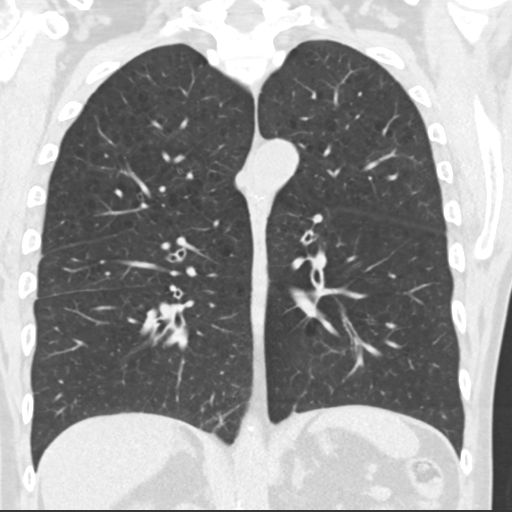

[14 of 36 positions shown; findings below may reference images not displayed]

FINDINGS: Cardiovascular: Heart size is normal. There is no significant
pericardial fluid, thickening or pericardial calcification. There is
aortic atherosclerosis, as well as atherosclerosis of the great
vessels of the mediastinum and the coronary arteries, including
calcified atherosclerotic plaque in the left main, left anterior
descending, left circumflex and right coronary arteries.
Calcifications of the aortic valve.

Mediastinum/Nodes: Prominent enlarging but technically nonenlarged
AP window lymph node measuring 9 mm in short axis (axial image 61 of
series 2), concerning for metastatic lymph node. No other definite
mediastinal or hilar lymphadenopathy confidently identified on
today's noncontrast CT examination. Esophagus is unremarkable in
appearance. No axillary lymphadenopathy.

Lungs/Pleura: Enlarging centrally cavitary thick-walled left upper
lobe mass which currently measures 4.3 x 6.8 x 4.6 cm (axial image
50 of series 3 and coronal image 32 of series 5). This mass once
again has macrolobulated margins with spiculated borders with
extension to the overlying pleura antral laterally which appears
retracted. Small air-fluid level within the lesion on today's study
may suggest superinfection of the lesion. Some ground-glass
attenuation and septal thickening in the adjacent lung parenchyma is
compatible with local infiltration of tumor and/or mild
postobstructive changes. No other definite suspicious appearing
pulmonary nodules or masses are noted. No acute consolidative
airspace disease. No pleural effusions. Diffuse bronchial wall
thickening with moderate centrilobular and paraseptal emphysema.

Upper Abdomen: Aortic atherosclerosis. Mild diffuse low attenuation
throughout the visualized hepatic parenchyma, indicative of hepatic
steatosis.

Musculoskeletal: There are no aggressive appearing lytic or blastic
lesions noted in the visualized portions of the skeleton.
IMPRESSION: 1. Enlarging thick-walled cavitary mass in the left upper lobe which
currently measures 4.3 x 6.8 x 4.6 cm with probable metastatic
lymphadenopathy in the AP window, as above.
2. Diffuse bronchial wall thickening with moderate centrilobular and
paraseptal emphysema; imaging findings suggestive of underlying
COPD.
3. Aortic atherosclerosis, in addition to left main and 3 vessel
coronary artery disease. Please note that although the presence of
coronary artery calcium documents the presence of coronary artery
disease, the severity of this disease and any potential stenosis
cannot be assessed on this non-gated CT examination. Assessment for
potential risk factor modification, dietary therapy or pharmacologic
therapy may be warranted, if clinically indicated.
4. There are calcifications of the aortic valve. Echocardiographic
correlation for evaluation of potential valvular dysfunction may be
warranted if clinically indicated.
5. Hepatic steatosis.

Aortic Atherosclerosis ([BD]-[BD]) and Emphysema ([BD]-[BD]).

## 2020-12-21 NOTE — Progress Notes (Signed)
PCP - Dr Rosita Fire Cardiologist - n/a Pulmonary - Dr Chesley Mires  CT Chest - scheduled 12/21/20  EKG - 06/04/20 Stress Test - 12/21/10 ECHO - 03/12/07 Cardiac Cath - 2010  Aspirin Instructions: Last dose was on 12/18/20.  Anesthesia review: Yes  STOP now taking any Aspirin (unless otherwise instructed by your surgeon), Aleve, Naproxen, Ibuprofen, Motrin, Advil, Goody's, BC's, all herbal medications, fish oil, and all vitamins.   Coronavirus Screening Covid test ion 12/21/20 is pending results. Do you have any of the following symptoms:  Cough yes/no: No Fever (>100.36F)  yes/no: No Runny nose yes/no: No Sore throat yes/no: No Difficulty breathing/shortness of breath  yes/no: No  Have you traveled in the last 14 days and where? yes/no: No  Patient verbalized understanding of instructions that were given via phone.

## 2020-12-22 LAB — SARS CORONAVIRUS 2 (TAT 6-24 HRS): SARS Coronavirus 2: NEGATIVE

## 2020-12-22 NOTE — Progress Notes (Addendum)
Anesthesia Chart Review: Same day workup  Pt with remote history of abnormal stress test 2005 showing possible inferior/lateral ischemia. Cardiac cath was recommended. It's unclear if this was done. Details are vague. Pt was seen again in 2008 at the ED for chest pain. ACS was ruled out. Echo showed mild LVH, normal wall motion, hyperdynamic LV function, moderate RV enlargement with normal systolic function. It does not appear he had any cardiac followup after that.   He is being followed by oncology for LUL mass measuring 3.9 x 4.0 cm with SUV 17.1.  Hx of etoh abuse. Per notes in care everywhere, drinks 6 beers and 1 pint of liquor daily.   Current smoker.   Reviewed history with Dr. Rodman Comp. He advised that pt can proceed with procedure as planned pending DOS eval by assigned anesthesiologist. He did advise that the patient does need outpatient cardiac followup given history of abnormal stress testing and that this would need to be done before any potential thoracic surgery (if needed) to address lung mass. I will communicate this recommendation to Dr. Leonarda Salon office as the patient has already been referred by Dr. Lamonte Sakai.   EKG 06/04/20: Sinus tachycardia. Rate 101.Borderline T abnormalities, anterior leads. Prolonged QT interval (QTc 507)  CT Super D Chest 12/21/20: IMPRESSION: 1. Enlarging thick-walled cavitary mass in the left upper lobe which currently measures 4.3 x 6.8 x 4.6 cm with probable metastatic lymphadenopathy in the AP window, as above. 2. Diffuse bronchial wall thickening with moderate centrilobular and paraseptal emphysema; imaging findings suggestive of underlying COPD. 3. Aortic atherosclerosis, in addition to left main and 3 vessel coronary artery disease. Please note that although the presence of coronary artery calcium documents the presence of coronary artery disease, the severity of this disease and any potential stenosis cannot be assessed on this  non-gated CT examination. Assessment for potential risk factor modification, dietary therapy or pharmacologic therapy may be warranted, if clinically indicated. 4. There are calcifications of the aortic valve. Echocardiographic correlation for evaluation of potential valvular dysfunction may be warranted if clinically indicated. 5. Hepatic steatosis.   PFT 12/08/20: FVC-%Pred-Pre Latest Units: % 77  FEV1-%Pred-Pre Latest Units: % 56  FEV1FVC-%Pred-Pre Latest Units: % 72  TLC % pred Latest Units: % 135  RV % pred Latest Units: % 270  DLCO unc % pred Latest Units: % 41    TTE 03/12/2007: IMPRESSION:  1. Mild eccentric left ventricular hypertrophy.  2. Small left lower cavity size with vigorous to hyperdynamic left      systolic function.  3. No regional wall motion abnormalities noted.  4. Moderate right ventricular enlargement with normal left systolic      function.  Stress test 03/12/2004: The patient underwent Bruce protocol exercise treadmill testing.  After  consent was obtained, the patient underwent routine stress testing.  His  resting ECG was notable only for inverted T-waves in V1.  The patient had an  excellent exercise tolerance with no complaints during the study whatsoever.   The patient reached and surpassed his target heart rate reaching a maximum  heart rate of 179.  His resting blood pressure was somewhat elevated and he  had an increase in his blood pressure during the study from 172/108 to a  maximum blood pressure of 240/112.  The study was stopped due to his  reaching his target heart rate as well as his blood pressure response to  exercise.  He exercised a total of 4 minutes and 57 seconds.  ECG tracings revealed progression of horizontal ST depression, predominantly  in lead V5 of just over 2 mm.  He had concurrent ST depression in V4, V6 and  also in inferior leads II, III and aVF.  These changes improved and  normalized at approximately 1 minute  during the resting phase of the study.  Again, he had no symptoms whatsoever during the testing.   IMPRESSION:  Evidence of positive stress test testing with concerns of  inferior and lateral ischemia which was quickly reversible.    Wynonia Musty [facility]/Anesthesiology Phone [phone] 12/23/2020 8:26 AM

## 2020-12-23 ENCOUNTER — Ambulatory Visit (HOSPITAL_COMMUNITY): Payer: Medicare PPO | Admitting: Physician Assistant

## 2020-12-23 ENCOUNTER — Ambulatory Visit (HOSPITAL_COMMUNITY): Payer: Medicare PPO

## 2020-12-23 ENCOUNTER — Encounter (HOSPITAL_COMMUNITY): Payer: Self-pay | Admitting: Emergency Medicine

## 2020-12-23 ENCOUNTER — Ambulatory Visit (HOSPITAL_COMMUNITY)
Admission: RE | Admit: 2020-12-23 | Discharge: 2020-12-23 | Disposition: A | Discharge status: Home / Self Care | Payer: Medicare PPO | Attending: Emergency Medicine | Admitting: Emergency Medicine

## 2020-12-23 ENCOUNTER — Other Ambulatory Visit: Payer: Self-pay

## 2020-12-23 ENCOUNTER — Encounter (HOSPITAL_COMMUNITY)
Admission: RE | Disposition: A | Discharge status: Home / Self Care | Payer: Self-pay | Source: Home / Self Care | Attending: Emergency Medicine

## 2020-12-23 DIAGNOSIS — E876 Hypokalemia: Secondary | ICD-10-CM | POA: Diagnosis not present

## 2020-12-23 DIAGNOSIS — J439 Emphysema, unspecified: Secondary | ICD-10-CM | POA: Diagnosis not present

## 2020-12-23 DIAGNOSIS — J432 Centrilobular emphysema: Secondary | ICD-10-CM | POA: Diagnosis not present

## 2020-12-23 DIAGNOSIS — I10 Essential (primary) hypertension: Secondary | ICD-10-CM | POA: Diagnosis not present

## 2020-12-23 DIAGNOSIS — K219 Gastro-esophageal reflux disease without esophagitis: Secondary | ICD-10-CM | POA: Diagnosis not present

## 2020-12-23 DIAGNOSIS — F17210 Nicotine dependence, cigarettes, uncomplicated: Secondary | ICD-10-CM | POA: Insufficient documentation

## 2020-12-23 DIAGNOSIS — C3432 Malignant neoplasm of lower lobe, left bronchus or lung: Secondary | ICD-10-CM | POA: Diagnosis not present

## 2020-12-23 DIAGNOSIS — F1010 Alcohol abuse, uncomplicated: Secondary | ICD-10-CM | POA: Diagnosis not present

## 2020-12-23 DIAGNOSIS — Z7982 Long term (current) use of aspirin: Secondary | ICD-10-CM | POA: Insufficient documentation

## 2020-12-23 DIAGNOSIS — C3412 Malignant neoplasm of upper lobe, left bronchus or lung: Secondary | ICD-10-CM | POA: Diagnosis not present

## 2020-12-23 DIAGNOSIS — J984 Other disorders of lung: Secondary | ICD-10-CM | POA: Diagnosis not present

## 2020-12-23 DIAGNOSIS — R918 Other nonspecific abnormal finding of lung field: Secondary | ICD-10-CM | POA: Diagnosis not present

## 2020-12-23 DIAGNOSIS — Z79899 Other long term (current) drug therapy: Secondary | ICD-10-CM | POA: Diagnosis not present

## 2020-12-23 DIAGNOSIS — I509 Heart failure, unspecified: Secondary | ICD-10-CM

## 2020-12-23 DIAGNOSIS — Z419 Encounter for procedure for purposes other than remedying health state, unspecified: Secondary | ICD-10-CM

## 2020-12-23 HISTORY — PX: BRONCHIAL NEEDLE ASPIRATION BIOPSY: SHX5106

## 2020-12-23 HISTORY — PX: BRONCHIAL BRUSHINGS: SHX5108

## 2020-12-23 HISTORY — PX: BRONCHIAL WASHINGS: SHX5105

## 2020-12-23 HISTORY — DX: Dyspnea, unspecified: R06.00

## 2020-12-23 HISTORY — PX: VIDEO BRONCHOSCOPY WITH ENDOBRONCHIAL NAVIGATION: SHX6175

## 2020-12-23 HISTORY — DX: Hyperlipidemia, unspecified: E78.5

## 2020-12-23 HISTORY — PX: BRONCHIAL BIOPSY: SHX5109

## 2020-12-23 HISTORY — DX: Depression, unspecified: F32.A

## 2020-12-23 IMAGING — DX DG CHEST 1V PORT
1 series · 1 of 1 positions shown · non-contrast
Comparison: [DATE]

CLINICAL DATA: Status post left upper lobe bronchoscopy.

EXAM:
PORTABLE CHEST 1 VIEW

[chest ap]
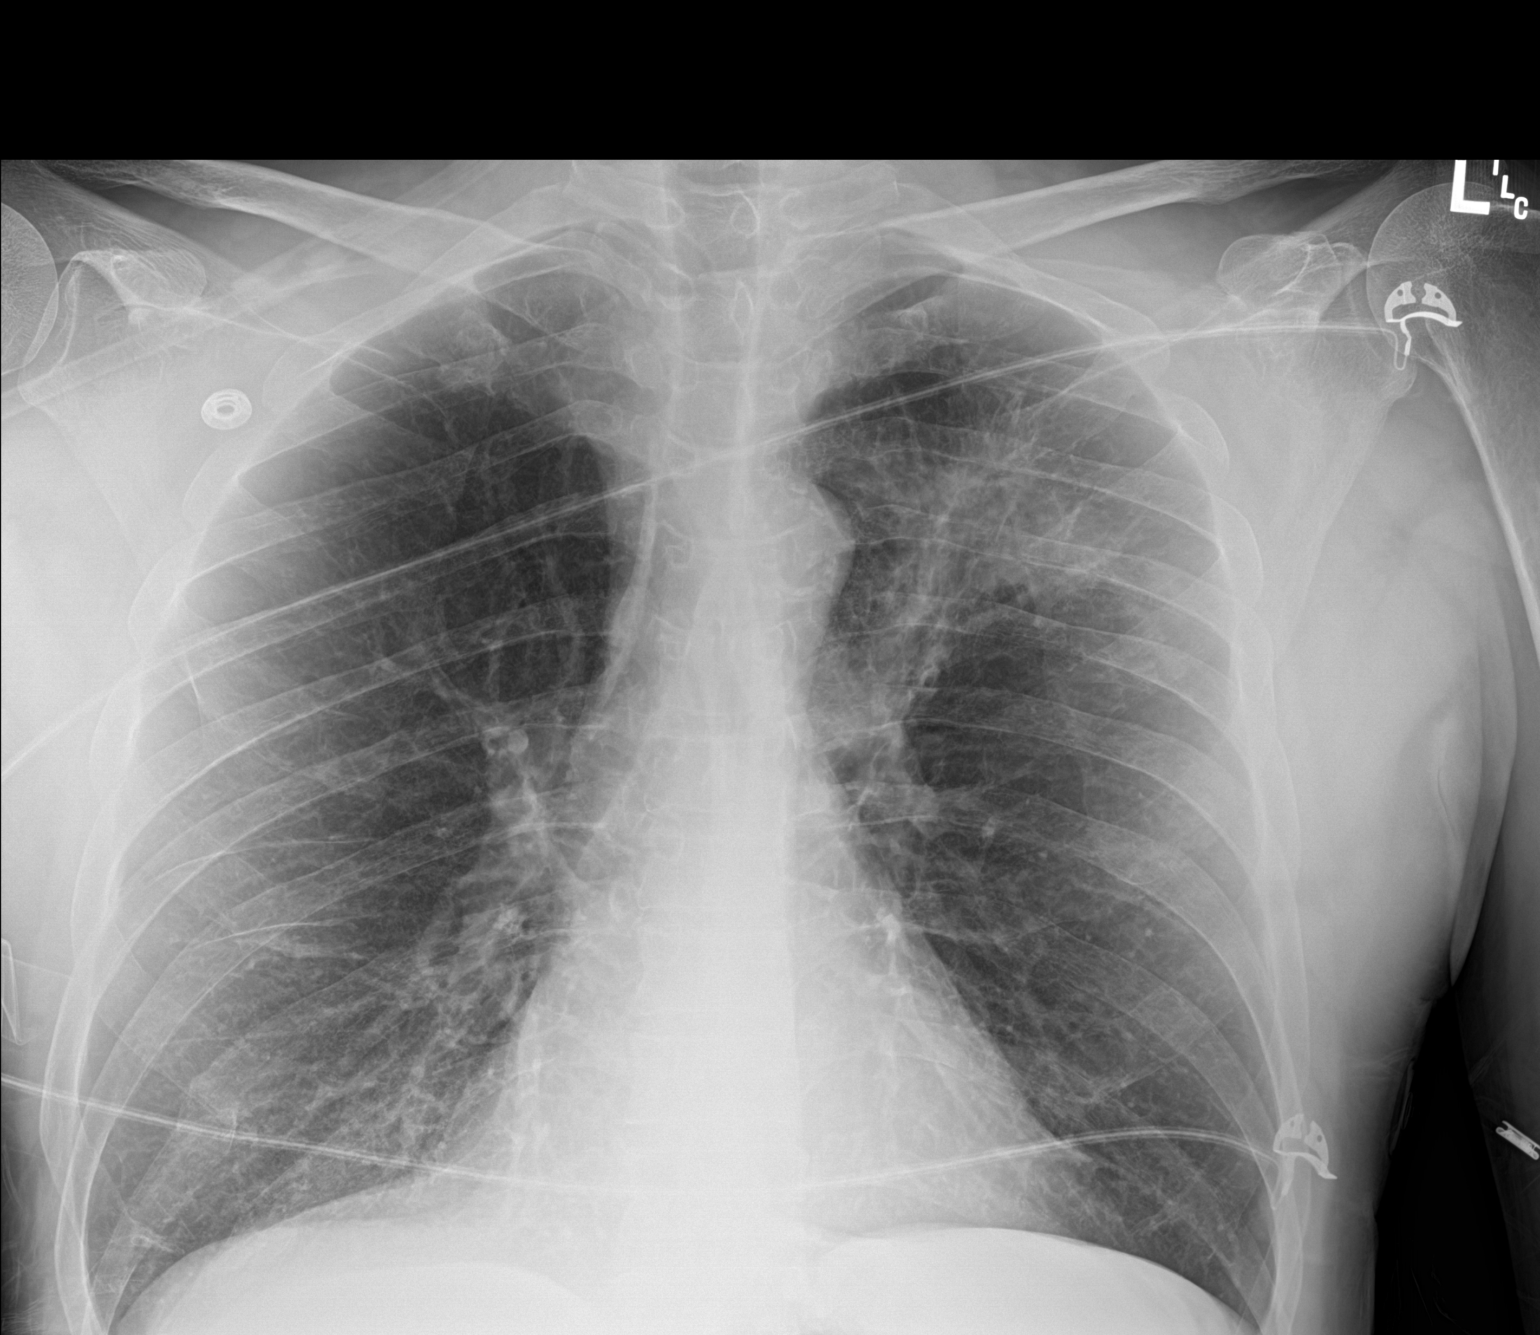

[1 of 1 positions shown; findings below may reference images not displayed]

FINDINGS: The heart size and mediastinal contours are within normal limits.
Chronic interstitial changes of emphysema identified. The left upper
lobe lung mass is again noted. This measures 5.1 cm. No pneumothorax
after bronchoscopy and biopsy. Remote right lower posterior rib
fracture. The visualized skeletal structures are unremarkable.
IMPRESSION: 1. No pneumothorax status post bronchoscopy and biopsy.
2. Left upper lobe lung mass.

## 2020-12-23 SURGERY — VIDEO BRONCHOSCOPY WITH ENDOBRONCHIAL NAVIGATION
Anesthesia: General

## 2020-12-23 MED ORDER — ROCURONIUM BROMIDE 100 MG/10ML IV SOLN
INTRAVENOUS | Status: DC | PRN
  Administered 2020-12-23: 50 mg via INTRAVENOUS

## 2020-12-23 MED ORDER — LACTATED RINGERS IV SOLN: INTRAVENOUS | Status: DC

## 2020-12-23 MED ORDER — ONDANSETRON HCL 4 MG/2ML IJ SOLN
INTRAMUSCULAR | Status: DC | PRN
  Administered 2020-12-23: 4 mg via INTRAVENOUS

## 2020-12-23 MED ORDER — DEXAMETHASONE SODIUM PHOSPHATE 10 MG/ML IJ SOLN
INTRAMUSCULAR | Status: DC | PRN
  Administered 2020-12-23: 10 mg via INTRAVENOUS

## 2020-12-23 MED ORDER — FENTANYL CITRATE (PF) 100 MCG/2ML IJ SOLN
INTRAMUSCULAR | Status: DC | PRN
  Administered 2020-12-23 (×2): 50 ug via INTRAVENOUS

## 2020-12-23 MED ORDER — MEPERIDINE HCL 100 MG/ML IJ SOLN: 6.2500 mg | INTRAMUSCULAR | Status: DC | PRN

## 2020-12-23 MED ORDER — PROPOFOL 10 MG/ML IV BOLUS
INTRAVENOUS | Status: DC | PRN
  Administered 2020-12-23: 120 mg via INTRAVENOUS

## 2020-12-23 MED ORDER — LIDOCAINE HCL (CARDIAC) PF 100 MG/5ML IV SOSY
PREFILLED_SYRINGE | INTRAVENOUS | Status: DC | PRN
  Administered 2020-12-23: 80 mg via INTRATRACHEAL

## 2020-12-23 MED ORDER — SUGAMMADEX SODIUM 200 MG/2ML IV SOLN
INTRAVENOUS | Status: DC | PRN
  Administered 2020-12-23: 200 mg via INTRAVENOUS

## 2020-12-23 MED ORDER — CHLORHEXIDINE GLUCONATE 0.12 % MT SOLN
OROMUCOSAL | Status: AC
  Administered 2020-12-23: 15 mL
  Filled 2020-12-23: qty 15

## 2020-12-23 MED ORDER — PHENYLEPHRINE HCL (PRESSORS) 10 MG/ML IV SOLN
INTRAVENOUS | Status: DC | PRN
  Administered 2020-12-23: 40 ug via INTRAVENOUS

## 2020-12-23 MED ORDER — FENTANYL CITRATE (PF) 100 MCG/2ML IJ SOLN: 25.0000 ug | INTRAMUSCULAR | Status: DC | PRN

## 2020-12-23 MED ORDER — PROMETHAZINE HCL 25 MG/ML IJ SOLN: 6.2500 mg | INTRAMUSCULAR | Status: DC | PRN

## 2020-12-23 NOTE — Interval H&P Note (Signed)
History and Physical Interval Note:  12/23/2020 8:07 AM  Bryan Fleming  has presented today for surgery, with the diagnosis of LEFT UPPER LOBE MASS.  The various methods of treatment have been discussed with the patient and family. After consideration of risks, benefits and other options for treatment, the patient has consented to  Procedure(s): [facility] (N/A) as a surgical intervention.  The patient's history has been reviewed, patient examined, no change in status, stable for surgery.  I have reviewed the patient's chart and labs.  Questions were answered to the patient's satisfaction.     [name]

## 2020-12-23 NOTE — Discharge Instructions (Signed)
Flexible Bronchoscopy, Care After This sheet gives you information about how to care for yourself after your test. Your doctor may also give you more specific instructions. If you have problems or questions, contact your doctor. Follow these instructions at home: Eating and drinking  Do not eat or drink anything (not even water) for 2 hours after your test, or until your numbing medicine (local anesthetic) wears off.  When your numbness is gone and your cough and gag reflexes have come back, you may: ? Eat only soft foods. ? Slowly drink liquids.  The day after the test, go back to your normal diet. Driving  Do not drive for 24 hours if you were given a medicine to help you relax (sedative).  Do not drive or use heavy machinery while taking prescription pain medicine. General instructions   Take over-the-counter and prescription medicines only as told by your doctor.  Return to your normal activities as told. Ask what activities are safe for you.  Do not use any products that have nicotine or tobacco in them. This includes cigarettes and e-cigarettes. If you need help quitting, ask your doctor.  Keep all follow-up visits as told by your doctor. This is important. It is very important if you had a tissue sample (biopsy) taken. Get help right away if:  You have shortness of breath that gets worse.  You get light-headed.  You feel like you are going to pass out (faint).  You have chest pain.  You cough up: ? More than a little blood. ? More blood than before. Summary  Do not eat or drink anything (not even water) for 2 hours after your test, or until your numbing medicine wears off.  Do not use cigarettes. Do not use e-cigarettes.  Get help right away if you have chest pain.  Please call our office for any questions or concerns.  [phone].   This information is not intended to replace advice given to you by your health care provider. Make sure you discuss any  questions you have with your health care provider. Document Released: 05/22/2009 Document Revised: 07/07/2017 Document Reviewed: 08/12/2016 Elsevier Patient Education  2020 [employer].

## 2020-12-23 NOTE — Progress Notes (Signed)
Notified Dr. Lissa Hoard that pt did not take his Metoprolol today. Last dose 12/22/20 am. BP 155/74, pulse 88 on arrival to short stay. No new orders.

## 2020-12-23 NOTE — Op Note (Signed)
Video Bronchoscopy with Electromagnetic Navigation Procedure Note  Date of Operation: 12/23/2020  Pre-op Diagnosis: Left upper lobe cavitary mass  Post-op Diagnosis: Same  Surgeon: Baltazar Apo  Assistants: None  Anesthesia: General endotracheal anesthesia  Operation: Flexible video fiberoptic bronchoscopy with electromagnetic navigation and biopsies.  Estimated Blood Loss: Minimal  Complications: None apparent  Indications and History: Bryan Fleming is a 60 y.o. male with history of tobacco use.  He was found to have a left upper lobe mass on lung cancer screening CT scan.  The risks, benefits, complications, treatment options and expected outcomes were discussed with the patient.  The possibilities of pneumothorax, pneumonia, reaction to medication, pulmonary aspiration, perforation of a viscus, bleeding, failure to diagnose a condition and creating a complication requiring transfusion or operation were discussed with the patient who freely signed the consent.    Description of Procedure: The patient was seen in the Preoperative Area, was examined and was deemed appropriate to proceed.  The patient was taken to [facility] endoscopy room 2, identified as [name] and the procedure verified as Flexible Video Fiberoptic Bronchoscopy.  A Time Out was held and the above information confirmed.   Prior to the date of the procedure a high-resolution CT scan of the chest was performed. Utilizing [place] a virtual tracheobronchial tree was generated to allow the creation of distinct navigation pathways to the patient's parenchymal abnormalities. After being taken to the operating room general anesthesia was initiated and the patient  was orally intubated. The video fiberoptic bronchoscope was introduced via the endotracheal tube and a general inspection was performed which showed normal airways throughout. The extendable working channel and locator guide were  introduced into the bronchoscope. The distinct navigation pathways prepared prior to this procedure were then utilized to navigate to within 0.3 cm of patient's left upper lobe mass identified on CT scan. The extendable working channel was secured into place and the locator guide was withdrawn. Under fluoroscopic guidance transbronchial needle brushings, transbronchial Wang needle biopsies, and transbronchial forceps biopsies were performed to be sent for cytology and pathology. A bronchioalveolar lavage was performed in the left upper lobe and sent for cytology. At the end of the procedure a general airway inspection was performed and there was no evidence of active bleeding. The bronchoscope was removed.  The patient tolerated the procedure well. There was no significant blood loss and there were no obvious complications. A post-procedural chest x-ray is pending.  Samples: 1. Transbronchial needle brushings from left upper lobe mass 2. Transbronchial Wang needle biopsies from left upper lobe mass 3. Transbronchial forceps biopsies from left upper lobe mass 4. Bronchoalveolar lavage from left upper lobe  Plans:  The patient will be discharged from the PACU to home when recovered from anesthesia and after chest x-ray is reviewed. We will review the cytology, pathology and microbiology results with the patient when they become available. Outpatient followup will be with Dr Delton Coombes and Dr Halford Chessman.    Marae Cottrell S. 12/23/2020

## 2020-12-23 NOTE — Anesthesia Procedure Notes (Signed)
Procedure Name: Intubation Date/Time: 12/23/2020 8:34 AM Performed by: Myna Bright, CRNA Pre-anesthesia Checklist: Patient identified, Emergency Drugs available, Suction available and Patient being monitored Patient Re-evaluated:Patient Re-evaluated prior to induction Oxygen Delivery Method: Circle system utilized Preoxygenation: Pre-oxygenation with 100% oxygen Induction Type: IV induction Ventilation: Mask ventilation without difficulty Laryngoscope Size: Mac and 4 Grade View: Grade I Tube type: Oral Tube size: 9.0 mm Number of attempts: 1 Airway Equipment and Method: Stylet Placement Confirmation: ETT inserted through vocal cords under direct vision,  positive ETCO2 and breath sounds checked- equal and bilateral Secured at: 22 cm Tube secured with: Tape Dental Injury: Teeth and Oropharynx as per pre-operative assessment

## 2020-12-23 NOTE — Anesthesia Preprocedure Evaluation (Addendum)
Anesthesia Evaluation  Patient identified by MRN, date of birth, ID band Patient awake    Reviewed: Allergy & Precautions, NPO status , Patient's Chart, lab work & pertinent test results  Airway Mallampati: II  TM Distance: >3 FB Neck ROM: Full    Dental  (+) Dental Advisory Given   Pulmonary shortness of breath, COPD, Current Smoker,    Pulmonary exam normal breath sounds clear to auscultation       Cardiovascular hypertension, Pt. on home beta blockers and Pt. on medications Normal cardiovascular exam Rhythm:Regular Rate:Normal     Neuro/Psych PSYCHIATRIC DISORDERS Anxiety Depression negative neurological ROS     GI/Hepatic GERD  ,(+) Hepatitis -  Endo/Other  negative endocrine ROS  Renal/GU negative Renal ROS     Musculoskeletal negative musculoskeletal ROS (+)   Abdominal   Peds  Hematology  (+) Blood dyscrasia, anemia ,   Anesthesia Other Findings   Reproductive/Obstetrics                            Anesthesia Physical Anesthesia Plan  ASA: III  Anesthesia Plan: General   Post-op Pain Management:    Induction: Intravenous  PONV Risk Score and Plan: 1 and Ondansetron, Dexamethasone, Treatment may vary due to age or medical condition and Midazolam  Airway Management Planned: Oral ETT  Additional Equipment:   Intra-op Plan:   Post-operative Plan: Extubation in OR  Informed Consent: I have reviewed the patients History and Physical, chart, labs and discussed the procedure including the risks, benefits and alternatives for the proposed anesthesia with the patient or authorized representative who has indicated his/her understanding and acceptance.     Dental advisory given  Plan Discussed with: CRNA  Anesthesia Plan Comments:        Anesthesia Quick Evaluation

## 2020-12-23 NOTE — Progress Notes (Signed)
Waiting for chest x-ray to be read. Vital signs stable. No further monitoring required.   Gabriel Rainwater, RN

## 2020-12-23 NOTE — Anesthesia Postprocedure Evaluation (Signed)
Anesthesia Post Note  Patient: Bryan Fleming  Procedure(s) Performed: VIDEO BRONCHOSCOPY WITH ENDOBRONCHIAL NAVIGATION (N/A ) BRONCHIAL BRUSHINGS BRONCHIAL NEEDLE ASPIRATION BIOPSIES BRONCHIAL BIOPSIES BRONCHIAL WASHINGS     Patient location during evaluation: PACU Anesthesia Type: General Level of consciousness: sedated and patient cooperative Pain management: pain level controlled Vital Signs Assessment: post-procedure vital signs reviewed and stable Respiratory status: spontaneous breathing Cardiovascular status: stable Anesthetic complications: no   No complications documented.  Last Vitals:  Vitals:   12/23/20 1030 12/23/20 1130  BP: (!) 166/87 (!) 161/73  Pulse: 89 90  Resp: 12 16  Temp: (!) 36.1 C (!) 36.3 C  SpO2: 94%     Last Pain:  Vitals:   12/23/20 1130  TempSrc:   PainSc: 0-No pain                 Nolon Nations

## 2020-12-23 NOTE — Transfer of Care (Signed)
Immediate Anesthesia Transfer of Care Note  Patient: Bryan Fleming  Procedure(s) Performed: VIDEO BRONCHOSCOPY WITH ENDOBRONCHIAL NAVIGATION (N/A ) BRONCHIAL BRUSHINGS BRONCHIAL NEEDLE ASPIRATION BIOPSIES BRONCHIAL BIOPSIES BRONCHIAL WASHINGS  Patient Location: PACU  Anesthesia Type:General  Level of Consciousness: awake, alert , oriented and patient cooperative  Airway & Oxygen Therapy: Patient Spontanous Breathing and Patient connected to face mask oxygen  Post-op Assessment: Report given to RN, Post -op Vital signs reviewed and stable and Patient moving all extremities  Post vital signs: Reviewed and stable  Last Vitals:  Vitals Value Taken Time  BP 166/83 12/23/20 0945  Temp 37.1 C 12/23/20 0945  Pulse 96 12/23/20 0948  Resp 22 12/23/20 0948  SpO2 96 % 12/23/20 0948  Vitals shown include unvalidated device data.  Last Pain:  Vitals:   12/23/20 0945  TempSrc:   PainSc: 0-No pain         Complications: No complications documented.

## 2020-12-24 ENCOUNTER — Telehealth: Payer: Self-pay | Admitting: Emergency Medicine

## 2020-12-24 LAB — CYTOLOGY - NON PAP

## 2020-12-24 NOTE — Telephone Encounter (Signed)
Called pt to review FOB results - show squamous cell lung CA. No answer, will try him again. He has f/u set to see Dr Roxan Hockey and Dr Delton Coombes

## 2020-12-25 ENCOUNTER — Encounter (HOSPITAL_COMMUNITY): Payer: Self-pay | Admitting: Emergency Medicine

## 2020-12-25 NOTE — Telephone Encounter (Signed)
Discussed the path results with the patient. Shows squamous cell lung CA.  He may be a surgical candidate +/- adjuvant rx. His 4L node has mild hypermetabolism on PET, but was not reachable by EBUS.

## 2020-12-28 ENCOUNTER — Other Ambulatory Visit: Payer: Self-pay

## 2020-12-28 ENCOUNTER — Encounter: Payer: Self-pay | Admitting: Thoracic Surgery (Cardiothoracic Vascular Surgery)

## 2020-12-28 ENCOUNTER — Telehealth: Payer: Medicare PPO | Admitting: Thoracic Surgery (Cardiothoracic Vascular Surgery)

## 2020-12-28 ENCOUNTER — Encounter: Payer: Medicare PPO | Admitting: Thoracic Surgery (Cardiothoracic Vascular Surgery)

## 2020-12-28 VITALS — BP 121/70 | HR 92 | Resp 20 | Ht 69.0 in | Wt 158.0 lb

## 2020-12-28 DIAGNOSIS — J984 Other disorders of lung: Secondary | ICD-10-CM

## 2020-12-28 NOTE — Progress Notes (Signed)
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    CANCER STAGING: Cancer Staging No matching staging information was found for the patient.  INTERVAL HISTORY:  Bryan Fleming, a 60 y.o. 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 Bryan Fleming was last seen on 12/17/2020.   Today he reports feeling okay. He will be contacted by Dr. Roxan Hockey this evening. He expresses a desire to stop smoking, but cannot afford the nicotine patch. He smokes 1-1.5 ppd. He has a cough that produce small amounts of sputum but no blood.   REVIEW OF SYSTEMS:  Review of Systems  Constitutional: Positive for appetite change (25%) and fatigue (50%).  Respiratory: Positive for cough.   Neurological: Positive for numbness (feet).  All other systems reviewed and are negative.   PAST MEDICAL/SURGICAL HISTORY:  Past Medical History:  Diagnosis Date  . COPD (chronic obstructive pulmonary disease) ([place]) 2007   EMPHYSEMA  . Depression   . Dyspnea    occasional - no oxygen  . GERD (gastroesophageal reflux disease) 2010  . Hepatitis    Alcoholic   . HLD (hyperlipidemia)    no meds  . Hypertension    Past Surgical History:  Procedure Laterality Date  . BRONCHIAL BIOPSY  12/23/2020   Procedure: BRONCHIAL BIOPSIES;  Surgeon: [name], MD;  Location: [facility] ENDOSCOPY;  Service: Pulmonary;;  . BRONCHIAL BRUSHINGS  12/23/2020   Procedure: BRONCHIAL BRUSHINGS;  Surgeon: [name], MD;  Location: [facility] ENDOSCOPY;  Service: Pulmonary;;  . BRONCHIAL NEEDLE ASPIRATION BIOPSY   12/23/2020   Procedure: BRONCHIAL NEEDLE ASPIRATION BIOPSIES;  Surgeon: [name], MD;  Location: [facility] ENDOSCOPY;  Service: Pulmonary;;  . BRONCHIAL WASHINGS  12/23/2020   Procedure: BRONCHIAL WASHINGS;  Surgeon: [name], MD;  Location: [facility] ENDOSCOPY;  Service: Pulmonary;;  . CARDIAC CATHETERIZATION  2010  . COLONOSCOPY  03/14/2012   Procedure: COLONOSCOPY;  Surgeon: Rogene Houston, MD;  Location: [facility] ENDO SUITE;  Service: Endoscopy;  Laterality: N/A;  100  . VIDEO BRONCHOSCOPY WITH ENDOBRONCHIAL NAVIGATION N/A 12/23/2020   Procedure: VIDEO BRONCHOSCOPY WITH ENDOBRONCHIAL NAVIGATION;  Surgeon: [name], MD;  Location: [facility] ENDOSCOPY;  Service: Pulmonary;  Laterality: N/A;  . WRIST GANGLION EXCISION  1985    SOCIAL HISTORY:  Social History   Socioeconomic History  . Marital status: Widowed    Spouse name: Not on file  . Number of children: Not on file  . Years of education: Not on file  . Highest education level: Not on file  Occupational History  . Not on file  Tobacco Use  . Smoking status: Current Every Day Smoker    Packs/day: 1.00    Years: 37.00    Pack years: 37.00    Types: Cigarettes  . Smokeless tobacco: Never Used  . Tobacco comment: smokes 1-1.5 per day 12/22/20  Vaping Use  . Vaping Use: Never used  Substance and Sexual Activity  . Alcohol use: Yes    Alcohol/week: 22.0 standard drinks    Types: 21 Cans of beer, 1 Standard drinks or  equivalent per week    Comment: daily  . Drug use: No  . Sexual activity: Yes  Other Topics Concern  . Not on file  Social History Narrative  . Not on file   Social Determinants of Health   Financial Resource Strain: Not on file  Food Insecurity: Not on file  Transportation Needs: No Transportation Needs  . Lack of Transportation (Medical): No  . Lack of Transportation (Non-Medical): No  Physical Activity: Inactive  . Days of Exercise per Week: 0 days  . Minutes of Exercise per Session: 0 min  Stress: Not on  file  Social Connections: Not on file  Intimate Partner Violence: Not At Risk  . Fear of Current or Ex-Partner: No  . Emotionally Abused: No  . Physically Abused: No  . Sexually Abused: No    FAMILY HISTORY:  Family History  Problem Relation Age of Onset  . Hypertension Mother   . Asthma Father   . Diabetes Sister   . Hypertension Sister   . Hypertension Brother   . Hypertension Sister   . Lupus Daughter     CURRENT MEDICATIONS:  Current Outpatient Medications  Medication Sig Dispense Refill  . amLODipine (NORVASC) 5 MG tablet Take 5 mg by mouth daily.    Marland Kitchen aspirin 81 MG tablet Take 81 mg by mouth daily.    . metoprolol tartrate (LOPRESSOR) 25 MG tablet Take 25 mg by mouth 2 (two) times daily.    . Multiple Vitamin (MULTIVITAMIN WITH MINERALS) TABS tablet Take 1 tablet by mouth daily.    Marland Kitchen PROVENTIL HFA 108 (90 Base) MCG/ACT inhaler INHALE 2 PUFFS BY MOUTH EVERY 6 HOURS AS NEEDED FOR COUGHING, WHEEZING, OR SHORTNESS OF BREATH (Patient taking differently: Inhale 2 puffs into the lungs every 6 (six) hours as needed for wheezing or shortness of breath (or coughing).) 20.1 g 1  . SYMBICORT 160-4.5 MCG/ACT inhaler Inhale 2 puffs into the lungs in the morning and at bedtime.     No current facility-administered medications for this visit.    ALLERGIES:  Allergies  Allergen Reactions  . Other Nausea Only and Other (See Comments)    No spicy foods- Patient cannot tolerate- HEARTBURN    PHYSICAL EXAM:  Performance status (ECOG): 1 - Symptomatic but completely ambulatory  There were no vitals filed for this visit. Wt Readings from Last 3 Encounters:  12/23/20 154 lb (69.9 kg)  12/17/20 158 lb 6.4 oz (71.8 kg)  12/03/20 158 lb 9.6 oz (71.9 kg)   Physical Exam Vitals reviewed.  Constitutional:      Appearance: Normal appearance.  Cardiovascular:     Rate and Rhythm: Normal rate and regular rhythm.     Pulses: Normal pulses.     Heart sounds: Normal heart sounds.   Pulmonary:     Effort: Pulmonary effort is normal.     Breath sounds: Normal breath sounds.  Musculoskeletal:     Right lower leg: No edema.     Left lower leg: No edema.  Neurological:     General: No focal deficit present.     Mental Status: He is alert and oriented to person, place, and time.  Psychiatric:        Mood and Affect: Mood normal.        Behavior: Behavior normal.      LABORATORY DATA:  I have reviewed the labs as listed.  CBC Latest Ref Rng & Units 12/18/2020 06/05/2020 06/04/2020  WBC 4.0 - 10.5 K/uL 7.8 7.4  10.9(H)  Hemoglobin 13.0 - 17.0 g/dL 11.4(L) 13.6 16.3  Hematocrit 39.0 - 52.0 % 36.0(L) 42.2 48.2  Platelets 150 - 400 K/uL 264 171 232   CMP Latest Ref Rng & Units 12/18/2020 06/05/2020 06/04/2020  Glucose 70 - 99 mg/dL 114(H) 106(H) 203(H)  BUN 6 - 20 mg/dL 8 5(L) 5(L)  Creatinine 0.61 - 1.24 mg/dL 0.61 0.61 0.73  Sodium 135 - 145 mmol/L 136 133(L) 135  Potassium 3.5 - 5.1 mmol/L 4.1 4.4 3.1(L)  Chloride 98 - 111 mmol/L 103 99 88(L)  CO2 22 - 32 mmol/L 23 26 30   Calcium 8.9 - 10.3 mg/dL 9.3 8.8(L) 9.4  Total Protein 6.5 - 8.1 g/dL 8.6(H) 6.7 8.6(H)  Total Bilirubin 0.3 - 1.2 mg/dL 0.5 1.1 1.1  Alkaline Phos 38 - 126 U/L 80 63 88  AST 15 - 41 U/L 91(H) 28 69(H)  ALT 0 - 44 U/L 42 20 32    DIAGNOSTIC IMAGING:  I have independently reviewed the scans and discussed with the patient. MR Brain W Wo Contrast  Result Date: 12/17/2020 CLINICAL DATA:  60 year old male recently diagnosed with lung cancer. Staging. EXAM: MRI HEAD WITHOUT AND WITH CONTRAST TECHNIQUE: Multiplanar, multiecho pulse sequences of the brain and surrounding structures were obtained without and with intravenous contrast. CONTRAST:  55mL GADAVIST GADOBUTROL 1 MMOL/ML IV SOLN COMPARISON:  PET-CT 12/14/2020 FINDINGS: Brain: No midline shift, mass effect, or evidence of intracranial mass lesion. No abnormal enhancement identified. No dural thickening. No restricted diffusion to suggest acute  infarction. No ventriculomegaly, extra-axial collection or acute intracranial hemorrhage. Cervicomedullary junction and pituitary are within normal limits. Scattered and occasionally patchy nonspecific cerebral white matter T2 and FLAIR hyperintensity, with similar signal heterogeneity in the pons. Deep gray matter nuclei and cerebellum appear negative. No cortical encephalomalacia or chronic cerebral blood products identified. Vascular: Major intracranial vascular flow voids are preserved. Major dural venous sinuses are enhancing and appear to be patent. Skull and upper cervical spine: Bone marrow signal in the calvarium is mildly heterogeneous but benign, with no destructive or suspicious osseous lesion identified. Negative visible cervical spine, spinal cord. Sinuses/Orbits: Negative orbits. Bilateral maxillary sinus mucous retention cysts. Other: Visible internal auditory structures appear normal. Negative visible scalp and face soft tissues. IMPRESSION: 1. No metastatic disease or acute intracranial abnormality. 2. Mild to moderate for age nonspecific signal changes in the cerebral white matter and pons, most commonly due to chronic small vessel disease. Electronically Signed   By: Genevie Ann M.D.   On: 12/17/2020 05:39   NM PET Image Initial (PI) Skull Base To Thigh  Result Date: 12/15/2020 CLINICAL DATA:  Initial treatment strategy for left upper lobe cavitary lung mass on lung cancer screening CT. Borderline AP window adenopathy. EXAM: NUCLEAR MEDICINE PET SKULL BASE TO THIGH TECHNIQUE: 9.6 mCi F-18 FDG was injected intravenously. Full-ring PET imaging was performed from the skull base to thigh after the radiotracer. CT data was obtained and used for attenuation correction and anatomic localization. Fasting blood glucose: 90 mg/dl COMPARISON:  Chest CT of 10/06/2020.  Abdominopelvic CT 06/04/2020 FINDINGS: Mediastinal blood pool activity: SUV max 2.1 Liver activity: SUV max NA NECK: No areas of abnormal  hypermetabolism. Incidental CT findings: Bilateral maxillary sinus mucous retention cysts or polyps. Bilateral carotid atherosclerosis. CHEST: Hypermetabolism corresponding to the previously described cavitary left upper lobe lung mass. Example 3.9 x 4.0 cm and a S.U.V. max of 17.1 on 96/3. Contiguous hypermetabolism along the left chest wall, including involving the anterolateral left third  rib at a S.U.V. max of 5.4 on 98/3. No gross osseous destruction. The AP window/prevascular node of 9 mm is mildly hypermetabolic including at a S.U.V. max of 2.8 on 101/3. Incidental CT findings: Aortic and coronary artery calcification. Moderate centrilobular and mild paraseptal emphysema. ABDOMEN/PELVIS: No abdominopelvic parenchymal or nodal hypermetabolism. Incidental CT findings: Normal adrenal glands. Mild hepatic steatosis. Mild prostatomegaly. SKELETON: No other abnormal marrow activity. Incidental CT findings: Remote lower right rib fractures. IMPRESSION: 1. Hypermetabolic left upper lobe lung mass, consistent with primary bronchogenic carcinoma. Contiguous nonspecific hypermetabolism along the adjacent anterior left chest wall, without gross soft tissue mass or osseous involvement. 2. Prevascular/AP window node with mild hypermetabolism, suspicious for nodal metastasis. 3. No evidence of extrathoracic hypermetabolic metastatic disease. 4. Incidental findings, including: Aortic atherosclerosis (ICD10-I70.0), coronary artery atherosclerosis and emphysema (ICD10-J43.9). Hepatic steatosis. Electronically Signed   By: Abigail Miyamoto M.D.   On: 12/15/2020 12:45   DG Chest Port 1 View  Result Date: 12/23/2020 CLINICAL DATA:  Status post left upper lobe bronchoscopy. EXAM: PORTABLE CHEST 1 VIEW COMPARISON:  12/21/2020 FINDINGS: The heart size and mediastinal contours are within normal limits. Chronic interstitial changes of emphysema identified. The left upper lobe lung mass is again noted. This measures 5.1 cm. No  pneumothorax after bronchoscopy and biopsy. Remote right lower posterior rib fracture. The visualized skeletal structures are unremarkable. IMPRESSION: 1. No pneumothorax status post bronchoscopy and biopsy. 2. Left upper lobe lung mass. Electronically Signed   By: Kerby Moors M.D.   On: 12/23/2020 11:58   CT Super D Chest Wo Contrast  Result Date: 12/22/2020 CLINICAL DATA:  60 year old male with history of cavitating left upper lobe mass presenting with productive cough for 1 year. EXAM: CT CHEST WITHOUT CONTRAST TECHNIQUE: Multidetector CT imaging of the chest was performed using thin slice collimation for electromagnetic bronchoscopy planning purposes, without intravenous contrast. COMPARISON:  Chest CT 10/16/2020.  PET-CT 12/14/2020. FINDINGS: Cardiovascular: Heart size is normal. There is no significant pericardial fluid, thickening or pericardial calcification. There is aortic atherosclerosis, as well as atherosclerosis of the great vessels of the mediastinum and the coronary arteries, including calcified atherosclerotic plaque in the left main, left anterior descending, left circumflex and right coronary arteries. Calcifications of the aortic valve. Mediastinum/Nodes: Prominent enlarging but technically nonenlarged AP window lymph node measuring 9 mm in short axis (axial image 61 of series 2), concerning for metastatic lymph node. No other definite mediastinal or hilar lymphadenopathy confidently identified on today's noncontrast CT examination. Esophagus is unremarkable in appearance. No axillary lymphadenopathy. Lungs/Pleura: Enlarging centrally cavitary thick-walled left upper lobe mass which currently measures 4.3 x 6.8 x 4.6 cm (axial image 50 of series 3 and coronal image 32 of series 5). This mass once again has macrolobulated margins with spiculated borders with extension to the overlying pleura antral laterally which appears retracted. Small air-fluid level within the lesion on today's study  may suggest superinfection of the lesion. Some ground-glass attenuation and septal thickening in the adjacent lung parenchyma is compatible with local infiltration of tumor and/or mild postobstructive changes. No other definite suspicious appearing pulmonary nodules or masses are noted. No acute consolidative airspace disease. No pleural effusions. Diffuse bronchial wall thickening with moderate centrilobular and paraseptal emphysema. Upper Abdomen: Aortic atherosclerosis. Mild diffuse low attenuation throughout the visualized hepatic parenchyma, indicative of hepatic steatosis. Musculoskeletal: There are no aggressive appearing lytic or blastic lesions noted in the visualized portions of the skeleton. IMPRESSION: 1. Enlarging thick-walled cavitary mass in the left upper lobe  which currently measures 4.3 x 6.8 x 4.6 cm with probable metastatic lymphadenopathy in the AP window, as above. 2. Diffuse bronchial wall thickening with moderate centrilobular and paraseptal emphysema; imaging findings suggestive of underlying COPD. 3. Aortic atherosclerosis, in addition to left main and 3 vessel coronary artery disease. Please note that although the presence of coronary artery calcium documents the presence of coronary artery disease, the severity of this disease and any potential stenosis cannot be assessed on this non-gated CT examination. Assessment for potential risk factor modification, dietary therapy or pharmacologic therapy may be warranted, if clinically indicated. 4. There are calcifications of the aortic valve. Echocardiographic correlation for evaluation of potential valvular dysfunction may be warranted if clinically indicated. 5. Hepatic steatosis. Aortic Atherosclerosis (ICD10-I70.0) and Emphysema (ICD10-J43.9). Electronically Signed   By: Vinnie Langton M.D.   On: 12/22/2020 10:40   DG C-ARM BRONCHOSCOPY  Result Date: 12/23/2020 C-ARM BRONCHOSCOPY: Fluoroscopy was utilized by the requesting physician.   No radiographic interpretation.     ASSESSMENT:  1.  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 Halford Chessman for left upper lobe lung mass. -Is current active smoker, 1/2 pack/day for 35 years. -CT lung cancer screening scan on 10/16/2020 showed cavitary mass in the anterior left upper lobe measuring 4.4 x 3.0 x 3.5 cm with 2.5 cm solid nodular component inferomedially. This abuts and involves the anterior pleura/chest wall. Expansile lytic lesion in the right posterolateral ninth and 10th ribs (unchanged dating back to CT from 06/16/2016). 10 mm short axis AP window node suspicious for nodal metastasis. -No change in baseline cough. No hemoptysis. No chest pain. - Brain MRI was negative for metastatic disease. - PET scan on 12/14/2020 showed left upper lobe mass measuring 3.9 x 4 cm with SUV 17.1.  Some contiguous hypermetabolism along the left chest wall including anterolateral left third rib with SUV 5.4.  No gross osseous destruction.  AP window lymph node of 9 mm, mildly hypermetabolic, SUV 2.8. - On 11/24/6220 left upper lobe brushing and lavage consistent with squamous cell carcinoma. - Left upper lobe fine-needle aspiration was also consistent with squamous cell carcinoma.  Lymph node could not be reached.  2. Social/family history: -Previously worked in [employer] fund[employer], currently on disability. -Current active smoker, 1/2 pack/day for 35 years. -Drinks 6 pack of beers along with 1 quart of liquor every day. -1 brother had throat cancer and another brother had lung cancer.  PLAN:  1.  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  These are consistent with squamous cell carcinoma. - We have discussed various options including surgical resection followed by chemotherapy with or without radiation.  We also discussed definitive chemoradiation option.  Neoadjuvant chemotherapy followed by  surgical resection is also an option. - He will see Dr. Roxan Hockey later this evening to discuss if he is a candidate for surgery.  We will make a decision after that.  2. Smoking cessation: - He is using Wellbutrin which is not helping.  He is still continuing to smoke a pack a day. - We prescribed nicotine patch which she could not afford. - We will see if we can get him help with nicotine patch.   Orders placed this encounter:  No orders of the defined types were placed in this encounter.    Derek Jack, MD [place] [phone]   I, Thana Ates, am acting as a  scribe for Dr. Derek Jack.  I, Derek Jack MD, have reviewed the above documentation for accuracy and completeness, and I agree with the above.

## 2020-12-29 ENCOUNTER — Inpatient Hospital Stay (HOSPITAL_BASED_OUTPATIENT_CLINIC_OR_DEPARTMENT_OTHER): Payer: Medicare PPO | Admitting: Hematology

## 2020-12-29 ENCOUNTER — Telehealth (INDEPENDENT_AMBULATORY_CARE_PROVIDER_SITE_OTHER): Payer: Medicare PPO | Admitting: Thoracic Surgery (Cardiothoracic Vascular Surgery)

## 2020-12-29 VITALS — BP 122/61 | HR 102 | Temp 96.9°F | Resp 18 | Wt 158.6 lb

## 2020-12-29 DIAGNOSIS — J984 Other disorders of lung: Secondary | ICD-10-CM

## 2020-12-29 DIAGNOSIS — R5383 Other fatigue: Secondary | ICD-10-CM | POA: Diagnosis not present

## 2020-12-29 DIAGNOSIS — K760 Fatty (change of) liver, not elsewhere classified: Secondary | ICD-10-CM | POA: Diagnosis not present

## 2020-12-29 DIAGNOSIS — I700 Atherosclerosis of aorta: Secondary | ICD-10-CM | POA: Diagnosis not present

## 2020-12-29 DIAGNOSIS — C3412 Malignant neoplasm of upper lobe, left bronchus or lung: Secondary | ICD-10-CM | POA: Diagnosis not present

## 2020-12-29 DIAGNOSIS — I6523 Occlusion and stenosis of bilateral carotid arteries: Secondary | ICD-10-CM | POA: Diagnosis not present

## 2020-12-29 DIAGNOSIS — J432 Centrilobular emphysema: Secondary | ICD-10-CM | POA: Diagnosis not present

## 2020-12-29 DIAGNOSIS — R200 Anesthesia of skin: Secondary | ICD-10-CM | POA: Diagnosis not present

## 2020-12-29 DIAGNOSIS — R599 Enlarged lymph nodes, unspecified: Secondary | ICD-10-CM | POA: Diagnosis not present

## 2020-12-29 DIAGNOSIS — I2510 Atherosclerotic heart disease of native coronary artery without angina pectoris: Secondary | ICD-10-CM | POA: Diagnosis not present

## 2020-12-29 NOTE — Progress Notes (Addendum)
PCP is Rosita Fire, MD Referring Provider is Rosita Fire, MD  No chief complaint on file.  HPI: Bryan Fleming is referred for consideration for surgery for a newly diagnosed left upper lobe lung cancer.  Bryan Fleming is a 60 year old man with a history of tobacco abuse, COPD, ethanol abuse, alcoholic hepatitis, pancreatitis, hypertension, hyperlipidemia, reflux, and depression.  He has about a 50-pack-year history of smoking (1.5 packs/day for x37 years).  He recently had a low-dose screening CT which showed a left upper lobe mass.  He was referred to Dr. Halford Chessman.  He did a PET/CT which showed the mass was hypermetabolic.  There also was a suspicious AP window lymph node.  There was some questionable activity in the chest wall.  He underwent navigational bronchoscopy by Dr. Lamonte Sakai.  Biopsy showed squamous cell carcinoma.  The lymph node was not accessible for sampling.  Brain MRI was negative for metastases.  He continues to smoke.  He currently is down to half a pack a day.  Per Dr. Tomie China note he has started Wellbutrin.  He has a frequent cough.  Nonproductive.  No hemoptysis.  Intermittent wheezing.  Uses Symbicort. Past Medical History:  Diagnosis Date  . COPD (chronic obstructive pulmonary disease) ([place]) 2007   EMPHYSEMA  . Depression   . Dyspnea    occasional - no oxygen  . GERD (gastroesophageal reflux disease) 2010  . Hepatitis    Alcoholic   . HLD (hyperlipidemia)    no meds  . Hypertension     Past Surgical History:  Procedure Laterality Date  . BRONCHIAL BIOPSY  12/23/2020   Procedure: BRONCHIAL BIOPSIES;  Surgeon: [name], MD;  Location: [facility] ENDOSCOPY;  Service: Pulmonary;;  . BRONCHIAL BRUSHINGS  12/23/2020   Procedure: BRONCHIAL BRUSHINGS;  Surgeon: [name], MD;  Location: [facility] ENDOSCOPY;  Service: Pulmonary;;  . BRONCHIAL NEEDLE ASPIRATION BIOPSY  12/23/2020   Procedure: BRONCHIAL NEEDLE ASPIRATION BIOPSIES;  Surgeon: [name], MD;   Location: [facility] ENDOSCOPY;  Service: Pulmonary;;  . BRONCHIAL WASHINGS  12/23/2020   Procedure: BRONCHIAL WASHINGS;  Surgeon: [name], MD;  Location: [facility] ENDOSCOPY;  Service: Pulmonary;;  . CARDIAC CATHETERIZATION  2010  . COLONOSCOPY  03/14/2012   Procedure: COLONOSCOPY;  Surgeon: Rogene Houston, MD;  Location: [facility] END[facility];  Service: Endoscopy;  Laterality: N/A;  100  . VIDEO BRONCHOSCOPY WITH ENDOBRONCHIAL NAVIGATION N/A 12/23/2020   Procedure: VIDEO BRONCHOSCOPY WITH ENDOBRONCHIAL NAVIGATION;  Surgeon: [name], MD;  Location: [facility] ENDOSCOPY;  Service: Pulmonary;  Laterality: N/A;  . WRIST GANGLION EXCISION  1985    Family History  Problem Relation Age of Onset  . Hypertension Mother   . Asthma Father   . Diabetes Sister   . Hypertension Sister   . Hypertension Brother   . Hypertension Sister   . Lupus Daughter     Social History Social History   Tobacco Use  . Smoking status: Current Every Day Smoker    Packs/day: 1.00    Years: 37.00    Pack years: 37.00    Types: Cigarettes  . Smokeless tobacco: Never Used  . Tobacco comment: smokes 1-1.5 per day 12/22/20  Vaping Use  . Vaping Use: Never used  Substance Use Topics  . Alcohol use: Yes    Alcohol/week: 22.0 standard drinks    Types: 21 Cans of beer, 1 Standard drinks or equivalent per week    Comment: daily  . Drug use: No    Current Outpatient  Medications  Medication Sig Dispense Refill  . amLODipine (NORVASC) 5 MG tablet Take 5 mg by mouth daily.    Marland Kitchen aspirin 81 MG tablet Take 81 mg by mouth daily.    . metoprolol tartrate (LOPRESSOR) 25 MG tablet Take 25 mg by mouth 2 (two) times daily.    . Multiple Vitamin (MULTIVITAMIN WITH MINERALS) TABS tablet Take 1 tablet by mouth daily.    Marland Kitchen PROVENTIL HFA 108 (90 Base) MCG/ACT inhaler INHALE 2 PUFFS BY MOUTH EVERY 6 HOURS AS NEEDED FOR COUGHING, WHEEZING, OR SHORTNESS OF BREATH (Patient taking differently: Inhale 2 puffs into the lungs every 6 (six) hours as  needed for wheezing or shortness of breath (or coughing).) 20.1 g 1  . sildenafil (VIAGRA) 50 MG tablet Take 50 mg by mouth daily as needed. (Patient not taking: Reported on 12/29/2020)    . SYMBICORT 160-4.5 MCG/ACT inhaler Inhale 2 puffs into the lungs in the morning and at bedtime.     No current facility-administered medications for this visit.    Allergies  Allergen Reactions  . Other Nausea Only and Other (See Comments)    No spicy foods- Patient cannot tolerate- HEARTBURN    Review of Systems See HPI  There were no vitals taken for this visit. Physical Exam No physical exam, telephone visit  Diagnostic Tests: CT CHEST WITHOUT CONTRAST  TECHNIQUE: Multidetector CT imaging of the chest was performed using thin slice collimation for electromagnetic bronchoscopy planning purposes, without intravenous contrast.  COMPARISON:  Chest CT 10/16/2020.  PET-CT 12/14/2020.  FINDINGS: Cardiovascular: Heart size is normal. There is no significant pericardial fluid, thickening or pericardial calcification. There is aortic atherosclerosis, as well as atherosclerosis of the great vessels of the mediastinum and the coronary arteries, including calcified atherosclerotic plaque in the left main, left anterior descending, left circumflex and right coronary arteries. Calcifications of the aortic valve.  Mediastinum/Nodes: Prominent enlarging but technically nonenlarged AP window lymph node measuring 9 mm in short axis (axial image 61 of series 2), concerning for metastatic lymph node. No other definite mediastinal or hilar lymphadenopathy confidently identified on today's noncontrast CT examination. Esophagus is unremarkable in appearance. No axillary lymphadenopathy.  Lungs/Pleura: Enlarging centrally cavitary thick-walled left upper lobe mass which currently measures 4.3 x 6.8 x 4.6 cm (axial image 50 of series 3 and coronal image 32 of series 5). This mass once again has  macrolobulated margins with spiculated borders with extension to the overlying pleura antral laterally which appears retracted. Small air-fluid level within the lesion on today's study may suggest superinfection of the lesion. Some ground-glass attenuation and septal thickening in the adjacent lung parenchyma is compatible with local infiltration of tumor and/or mild postobstructive changes. No other definite suspicious appearing pulmonary nodules or masses are noted. No acute consolidative airspace disease. No pleural effusions. Diffuse bronchial wall thickening with moderate centrilobular and paraseptal emphysema.  Upper Abdomen: Aortic atherosclerosis. Mild diffuse low attenuation throughout the visualized hepatic parenchyma, indicative of hepatic steatosis.  Musculoskeletal: There are no aggressive appearing lytic or blastic lesions noted in the visualized portions of the skeleton.  IMPRESSION: 1. Enlarging thick-walled cavitary mass in the left upper lobe which currently measures 4.3 x 6.8 x 4.6 cm with probable metastatic lymphadenopathy in the AP window, as above. 2. Diffuse bronchial wall thickening with moderate centrilobular and paraseptal emphysema; imaging findings suggestive of underlying COPD. 3. Aortic atherosclerosis, in addition to left main and 3 vessel coronary artery disease. Please note that although the presence of coronary artery  calcium documents the presence of coronary artery disease, the severity of this disease and any potential stenosis cannot be assessed on this non-gated CT examination. Assessment for potential risk factor modification, dietary therapy or pharmacologic therapy may be warranted, if clinically indicated. 4. There are calcifications of the aortic valve. Echocardiographic correlation for evaluation of potential valvular dysfunction may be warranted if clinically indicated. 5. Hepatic steatosis.  Aortic Atherosclerosis (ICD10-I70.0)  and Emphysema (ICD10-J43.9).   Electronically Signed   By: Vinnie Langton M.D.   On: 12/22/2020 10:40 NUCLEAR MEDICINE PET SKULL BASE TO THIGH  TECHNIQUE: 9.6 mCi F-18 FDG was injected intravenously. Full-ring PET imaging was performed from the skull base to thigh after the radiotracer. CT data was obtained and used for attenuation correction and anatomic localization.  Fasting blood glucose: 90 mg/dl  COMPARISON:  Chest CT of 10/06/2020.  Abdominopelvic CT 06/04/2020  FINDINGS: Mediastinal blood pool activity: SUV max 2.1  Liver activity: SUV max NA  NECK: No areas of abnormal hypermetabolism.  Incidental CT findings: Bilateral maxillary sinus mucous retention cysts or polyps. Bilateral carotid atherosclerosis.  CHEST: Hypermetabolism corresponding to the previously described cavitary left upper lobe lung mass. Example 3.9 x 4.0 cm and a S.U.V. max of 17.1 on 96/3. Contiguous hypermetabolism along the left chest wall, including involving the anterolateral left third rib at a S.U.V. max of 5.4 on 98/3. No gross osseous destruction.  The AP window/prevascular node of 9 mm is mildly hypermetabolic including at a S.U.V. max of 2.8 on 101/3.  Incidental CT findings: Aortic and coronary artery calcification. Moderate centrilobular and mild paraseptal emphysema.  ABDOMEN/PELVIS: No abdominopelvic parenchymal or nodal hypermetabolism.  Incidental CT findings: Normal adrenal glands. Mild hepatic steatosis. Mild prostatomegaly.  SKELETON: No other abnormal marrow activity.  Incidental CT findings: Remote lower right rib fractures.  IMPRESSION: 1. Hypermetabolic left upper lobe lung mass, consistent with primary bronchogenic carcinoma. Contiguous nonspecific hypermetabolism along the adjacent anterior left chest wall, without gross soft tissue mass or osseous involvement. 2. Prevascular/AP window node with mild hypermetabolism, suspicious for nodal  metastasis. 3. No evidence of extrathoracic hypermetabolic metastatic disease. 4. Incidental findings, including: Aortic atherosclerosis (ICD10-I70.0), coronary artery atherosclerosis and emphysema (ICD10-J43.9). Hepatic steatosis.   Electronically Signed   By: Abigail Miyamoto M.D.   On: 12/15/2020 12:45 I personally reviewed the CT and PET/CT images.  There is a hypermetabolic cavitary mass in the left upper lobe.  There is questionable involvement of the chest wall.  There is a highly suspicious node that has increased in size along the main pulmonary artery.  Findings consistent with T2, N2-stage IIIa disease with possibility of T3, N2, stage IIIb.  Pulmonary function testing FVC 3.01 (77%) FEV1 1.72 (56%) FEV1 1.59 (51%) postbronchodilator TLC 9.22 (135%) RV 5.81 (270%) DLCO 11.39 (41%)  Impression: Bryan Fleming is a 60 year old man with a history of tobacco abuse, COPD, ethanol abuse, alcoholic hepatitis, pancreatitis, hypertension, hyperlipidemia, reflux, and depression.  He recently had a low-dose screening CT which showed a 4 x 3.9 cm cavitary mass in the left upper lobe.  On PET/CT that was hypermetabolic with an SUV of 73.2.  There also is a lymph node along the main pulmonary artery that increased in size between his low-dose screening CT and is super D CT and was also hypermetabolic.    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  He will need multimodality therapy.  Options include definitive chemoradiation, resection followed by adjuvant  chemo +/- radiation, or neoadjuvant chemo followed by surgical resection or radiation depending on response.  He has adequate flows by FEV1 for lobectomy, but diffusion capacity is limited to 40% of predicted and borderline for adequate pulmonary function postresection.  Given his ongoing tobacco abuse and particularly the enlarging node along the pulmonary artery, I do not think  initial resection is the best option.  He will need multimodality therapy regardless.  I would favor neoadjuvant chemotherapy followed by restaging and consideration for resection at that time.  That would also allow time for him to quit smoking and have a cardiology evaluation that is needed prior to consideration for general anesthesia for a major lung resection.  He has a history of chest pain with abnormal echocardiogram and recommendation for cardiac catheterization in the past was never completely worked up.  Anesthesia did clear him for bronchoscopy but would not clear him for major lung resection without a formal cardiology evaluation.  We will arrange that in [place].  Plan: Quit smoking Cardiology evaluation We will discuss potential neoadjuvant chemotherapy with Dr. Delton Coombes I will plan to see him in a few weeks and do a 6-minute walk test on arrival.  I spent over 30 minutes in review of records, images, pulmonary function results, and in consultation with Bryan Fleming today Melrose Nakayama, MD [facility] [phone]  This visit was conducted by telephone.  The patient had come to the office the day prior but due to circumstances in the operating room I was unable to see him at that time.  I spent over 30 minutes in review of records, images, test results and in consultation with the patient.  MD location: Office Patient location: [place]. Roxan Hockey, MD [facility] [phone]

## 2020-12-29 NOTE — Patient Instructions (Signed)
Bryan Fleming at [facility] Discharge Instructions  You were seen today by Dr. Delton Coombes. He went over your recent results. Dr. Delton Coombes will see you back in 6 weeks for labs and follow up.   Thank you for choosing [place] at [facility] to provide your oncology and hematology care.  To afford each patient quality time with our provider, please arrive at least 15 minutes before your scheduled appointment time.   If you have a lab appointment with the [facility] please come in thru the Main Entrance and check in at the main information desk  You need to re-schedule your appointment should you arrive 10 or more minutes late.  We strive to give you quality time with our providers, and arriving late affects you and other patients whose appointments are after yours.  Also, if you no show three or more times for appointments you may be dismissed from the clinic at the providers discretion.     Again, thank you for choosing [facility].  Our hope is that these requests will decrease the amount of time that you wait before being seen by our physicians.       _____________________________________________________________  Should you have questions after your visit to [facility], please contact our office at [phone] between the hours of 8:00 a.m. and 4:30 p.m.  Voicemails left after 4:00 p.m. will not be returned until the following business day.  For prescription refill requests, have your pharmacy contact our office and allow 72 hours.    Cancer Center Support Programs:   > Cancer Support Group  2nd Tuesday of the month 1pm-2pm, Journey Room

## 2020-12-30 NOTE — Progress Notes (Signed)
Bryan Fleming, Bryan Fleming   CLINIC:  Medical Oncology/Hematology  PCP:  Rosita Fire, MD 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    CANCER STAGING: Cancer Staging No matching staging information was found for the patient.  INTERVAL HISTORY:  Bryan Fleming, a 60 y.o. 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 Bryan Fleming was last seen on 12/29/2020.   Today he reports feeling well. Today he accompanied by his wife and his daughter is present via telephone. He is agreeable to the proposed treatment plan. He denies any history of autoimmune disorders.   REVIEW OF SYSTEMS:  Review of Systems  Constitutional: Positive for appetite change (75%) and fatigue (75%).  Neurological: Positive for numbness (tingling in feet).  All other systems reviewed and are negative.   PAST MEDICAL/SURGICAL HISTORY:  Past Medical History:  Diagnosis Date  . COPD (chronic obstructive pulmonary disease) ([place]) 2007   EMPHYSEMA  . Depression   . Dyspnea    occasional - no oxygen  . GERD (gastroesophageal reflux disease) 2010  . Hepatitis    Alcoholic   . HLD (hyperlipidemia)    no meds  . Hypertension    Past Surgical History:  Procedure Laterality Date  . BRONCHIAL BIOPSY  12/23/2020   Procedure: BRONCHIAL BIOPSIES;  Surgeon: [name], MD;  Location: [facility] ENDOSCOPY;  Service: Pulmonary;;  . BRONCHIAL BRUSHINGS  12/23/2020   Procedure: BRONCHIAL BRUSHINGS;  Surgeon: [name], MD;  Location: [facility] ENDOSCOPY;  Service: Pulmonary;;  . BRONCHIAL NEEDLE ASPIRATION BIOPSY  12/23/2020   Procedure: BRONCHIAL NEEDLE ASPIRATION BIOPSIES;   Surgeon: [name], MD;  Location: [facility] ENDOSCOPY;  Service: Pulmonary;;  . BRONCHIAL WASHINGS  12/23/2020   Procedure: BRONCHIAL WASHINGS;  Surgeon: [name], MD;  Location: [place] ENDOSCOPY;  Service: Pulmonary;;  . CARDIAC CATHETERIZATION  2010  . COLONOSCOPY  03/14/2012   Procedure: COLONOSCOPY;  Surgeon: Rogene Houston, MD;  Location: [facility] ENDO [facility];  Service: Endoscopy;  Laterality: N/A;  100  . VIDEO BRONCHOSCOPY WITH ENDOBRONCHIAL NAVIGATION N/A 12/23/2020   Procedure: VIDEO BRONCHOSCOPY WITH ENDOBRONCHIAL NAVIGATION;  Surgeon: [name], MD;  Location: [facility] ENDOSCOPY;  Service: Pulmonary;  Laterality: N/A;  . WRIST GANGLION EXCISION  1985    SOCIAL HISTORY:  Social History   Socioeconomic History  . Marital status: Widowed    Spouse name: Not on file  . Number of children: Not on file  . Years of education: Not on file  . Highest education level: Not on file  Occupational History  . Not on file  Tobacco Use  . Smoking status: Current Every Day Smoker    Packs/day: 1.00    Years: 37.00    Pack years: 37.00    Types: Cigarettes  . Smokeless tobacco: Never Used  . Tobacco comment: smokes 1-1.5 per day 12/22/20  Vaping Use  . Vaping Use: Never used  Substance and Sexual Activity  . Alcohol use: Yes    Alcohol/week: 22.0 standard drinks    Types: 21 Cans of beer, 1 Standard drinks or equivalent per week    Comment: daily  . Drug use: No  . Sexual activity:  Yes  Other Topics Concern  . Not on file  Social History Narrative  . Not on file   Social Determinants of Health   Financial Resource Strain: Not on file  Food Insecurity: Not on file  Transportation Needs: No Transportation Needs  . Lack of Transportation (Medical): No  . Lack of Transportation (Non-Medical): No  Physical Activity: Inactive  . Days of Exercise per Week: 0 days  . Minutes of Exercise per Session: 0 min  Stress: Not on file  Social Connections: Not on file  Intimate Partner  Violence: Not At Risk  . Fear of Current or Ex-Partner: No  . Emotionally Abused: No  . Physically Abused: No  . Sexually Abused: No    FAMILY HISTORY:  Family History  Problem Relation Age of Onset  . Hypertension Mother   . Asthma Father   . Diabetes Sister   . Hypertension Sister   . Hypertension Brother   . Hypertension Sister   . Lupus Daughter     CURRENT MEDICATIONS:  Current Outpatient Medications  Medication Sig Dispense Refill  . amLODipine (NORVASC) 5 MG tablet Take 5 mg by mouth daily.    Marland Kitchen aspirin 81 MG tablet Take 81 mg by mouth daily.    . metoprolol tartrate (LOPRESSOR) 25 MG tablet Take 25 mg by mouth 2 (two) times daily.    . Multiple Vitamin (MULTIVITAMIN WITH MINERALS) TABS tablet Take 1 tablet by mouth daily.    Marland Kitchen PROVENTIL HFA 108 (90 Base) MCG/ACT inhaler INHALE 2 PUFFS BY MOUTH EVERY 6 HOURS AS NEEDED FOR COUGHING, WHEEZING, OR SHORTNESS OF BREATH (Patient taking differently: Inhale 2 puffs into the lungs every 6 (six) hours as needed for wheezing or shortness of breath (or coughing).) 20.1 g 1  . sildenafil (VIAGRA) 50 MG tablet Take 50 mg by mouth daily as needed. (Patient not taking: Reported on 12/29/2020)    . SYMBICORT 160-4.5 MCG/ACT inhaler Inhale 2 puffs into the lungs in the morning and at bedtime.     No current facility-administered medications for this visit.    ALLERGIES:  Allergies  Allergen Reactions  . Other Nausea Only and Other (See Comments)    No spicy foods- Patient cannot tolerate- HEARTBURN    PHYSICAL EXAM:  Performance status (ECOG): 1 - Symptomatic but completely ambulatory  There were no vitals filed for this visit. Wt Readings from Last 3 Encounters:  12/29/20 158 lb 9.6 oz (71.9 kg)  12/28/20 158 lb (71.7 kg)  12/23/20 154 lb (69.9 kg)   Physical Exam Vitals reviewed.  Constitutional:      Appearance: Normal appearance.  Cardiovascular:     Rate and Rhythm: Normal rate and regular rhythm.     Pulses: Normal  pulses.     Heart sounds: Normal heart sounds.  Pulmonary:     Effort: Pulmonary effort is normal.     Breath sounds: Normal breath sounds.  Neurological:     General: No focal deficit present.     Mental Status: He is alert and oriented to person, place, and time.  Psychiatric:        Mood and Affect: Mood normal.        Behavior: Behavior normal.      LABORATORY DATA:  I have reviewed the labs as listed.  CBC Latest Ref Rng & Units 12/18/2020 06/05/2020 06/04/2020  WBC 4.0 - 10.5 K/uL 7.8 7.4 10.9(H)  Hemoglobin 13.0 - 17.0 g/dL 11.4(L) 13.6 16.3  Hematocrit 39.0 - 52.0 %  36.0(L) 42.2 48.2  Platelets 150 - 400 K/uL 264 171 232   CMP Latest Ref Rng & Units 12/18/2020 06/05/2020 06/04/2020  Glucose 70 - 99 mg/dL 114(H) 106(H) 203(H)  BUN 6 - 20 mg/dL 8 5(L) 5(L)  Creatinine 0.61 - 1.24 mg/dL 0.61 0.61 0.73  Sodium 135 - 145 mmol/L 136 133(L) 135  Potassium 3.5 - 5.1 mmol/L 4.1 4.4 3.1(L)  Chloride 98 - 111 mmol/L 103 99 88(L)  CO2 22 - 32 mmol/L 23 26 30   Calcium 8.9 - 10.3 mg/dL 9.3 8.8(L) 9.4  Total Protein 6.5 - 8.1 g/dL 8.6(H) 6.7 8.6(H)  Total Bilirubin 0.3 - 1.2 mg/dL 0.5 1.1 1.1  Alkaline Phos 38 - 126 U/L 80 63 88  AST 15 - 41 U/L 91(H) 28 69(H)  ALT 0 - 44 U/L 42 20 32    DIAGNOSTIC IMAGING:  I have independently reviewed the scans and discussed with the patient. MR Brain W Wo Contrast  Result Date: 12/17/2020 CLINICAL DATA:  60 year old male recently diagnosed with lung cancer. Staging. EXAM: MRI HEAD WITHOUT AND WITH CONTRAST TECHNIQUE: Multiplanar, multiecho pulse sequences of the brain and surrounding structures were obtained without and with intravenous contrast. CONTRAST:  74mL GADAVIST GADOBUTROL 1 MMOL/ML IV SOLN COMPARISON:  PET-CT 12/14/2020 FINDINGS: Brain: No midline shift, mass effect, or evidence of intracranial mass lesion. No abnormal enhancement identified. No dural thickening. No restricted diffusion to suggest acute infarction. No ventriculomegaly,  extra-axial collection or acute intracranial hemorrhage. Cervicomedullary junction and pituitary are within normal limits. Scattered and occasionally patchy nonspecific cerebral white matter T2 and FLAIR hyperintensity, with similar signal heterogeneity in the pons. Deep gray matter nuclei and cerebellum appear negative. No cortical encephalomalacia or chronic cerebral blood products identified. Vascular: Major intracranial vascular flow voids are preserved. Major dural venous sinuses are enhancing and appear to be patent. Skull and upper cervical spine: Bone marrow signal in the calvarium is mildly heterogeneous but benign, with no destructive or suspicious osseous lesion identified. Negative visible cervical spine, spinal cord. Sinuses/Orbits: Negative orbits. Bilateral maxillary sinus mucous retention cysts. Other: Visible internal auditory structures appear normal. Negative visible scalp and face soft tissues. IMPRESSION: 1. No metastatic disease or acute intracranial abnormality. 2. Mild to moderate for age nonspecific signal changes in the cerebral white matter and pons, most commonly due to chronic small vessel disease. Electronically Signed   By: Genevie Ann M.D.   On: 12/17/2020 05:39   NM PET Image Initial (PI) Skull Base To Thigh  Result Date: 12/15/2020 CLINICAL DATA:  Initial treatment strategy for left upper lobe cavitary lung mass on lung cancer screening CT. Borderline AP window adenopathy. EXAM: NUCLEAR MEDICINE PET SKULL BASE TO THIGH TECHNIQUE: 9.6 mCi F-18 FDG was injected intravenously. Full-ring PET imaging was performed from the skull base to thigh after the radiotracer. CT data was obtained and used for attenuation correction and anatomic localization. Fasting blood glucose: 90 mg/dl COMPARISON:  Chest CT of 10/06/2020.  Abdominopelvic CT 06/04/2020 FINDINGS: Mediastinal blood pool activity: SUV max 2.1 Liver activity: SUV max NA NECK: No areas of abnormal hypermetabolism. Incidental CT  findings: Bilateral maxillary sinus mucous retention cysts or polyps. Bilateral carotid atherosclerosis. CHEST: Hypermetabolism corresponding to the previously described cavitary left upper lobe lung mass. Example 3.9 x 4.0 cm and a S.U.V. max of 17.1 on 96/3. Contiguous hypermetabolism along the left chest wall, including involving the anterolateral left third rib at a S.U.V. max of 5.4 on 98/3. No gross osseous destruction. The AP window/prevascular  node of 9 mm is mildly hypermetabolic including at a S.U.V. max of 2.8 on 101/3. Incidental CT findings: Aortic and coronary artery calcification. Moderate centrilobular and mild paraseptal emphysema. ABDOMEN/PELVIS: No abdominopelvic parenchymal or nodal hypermetabolism. Incidental CT findings: Normal adrenal glands. Mild hepatic steatosis. Mild prostatomegaly. SKELETON: No other abnormal marrow activity. Incidental CT findings: Remote lower right rib fractures. IMPRESSION: 1. Hypermetabolic left upper lobe lung mass, consistent with primary bronchogenic carcinoma. Contiguous nonspecific hypermetabolism along the adjacent anterior left chest wall, without gross soft tissue mass or osseous involvement. 2. Prevascular/AP window node with mild hypermetabolism, suspicious for nodal metastasis. 3. No evidence of extrathoracic hypermetabolic metastatic disease. 4. Incidental findings, including: Aortic atherosclerosis (ICD10-I70.0), coronary artery atherosclerosis and emphysema (ICD10-J43.9). Hepatic steatosis. Electronically Signed   By: Abigail Miyamoto M.D.   On: 12/15/2020 12:45   DG Chest Port 1 View  Result Date: 12/23/2020 CLINICAL DATA:  Status post left upper lobe bronchoscopy. EXAM: PORTABLE CHEST 1 VIEW COMPARISON:  12/21/2020 FINDINGS: The heart size and mediastinal contours are within normal limits. Chronic interstitial changes of emphysema identified. The left upper lobe lung mass is again noted. This measures 5.1 cm. No pneumothorax after bronchoscopy and  biopsy. Remote right lower posterior rib fracture. The visualized skeletal structures are unremarkable. IMPRESSION: 1. No pneumothorax status post bronchoscopy and biopsy. 2. Left upper lobe lung mass. Electronically Signed   By: Kerby Moors M.D.   On: 12/23/2020 11:58   CT Super D Chest Wo Contrast  Result Date: 12/22/2020 CLINICAL DATA:  60 year old male with history of cavitating left upper lobe mass presenting with productive cough for 1 year. EXAM: CT CHEST WITHOUT CONTRAST TECHNIQUE: Multidetector CT imaging of the chest was performed using thin slice collimation for electromagnetic bronchoscopy planning purposes, without intravenous contrast. COMPARISON:  Chest CT 10/16/2020.  PET-CT 12/14/2020. FINDINGS: Cardiovascular: Heart size is normal. There is no significant pericardial fluid, thickening or pericardial calcification. There is aortic atherosclerosis, as well as atherosclerosis of the great vessels of the mediastinum and the coronary arteries, including calcified atherosclerotic plaque in the left main, left anterior descending, left circumflex and right coronary arteries. Calcifications of the aortic valve. Mediastinum/Nodes: Prominent enlarging but technically nonenlarged AP window lymph node measuring 9 mm in short axis (axial image 61 of series 2), concerning for metastatic lymph node. No other definite mediastinal or hilar lymphadenopathy confidently identified on today's noncontrast CT examination. Esophagus is unremarkable in appearance. No axillary lymphadenopathy. Lungs/Pleura: Enlarging centrally cavitary thick-walled left upper lobe mass which currently measures 4.3 x 6.8 x 4.6 cm (axial image 50 of series 3 and coronal image 32 of series 5). This mass once again has macrolobulated margins with spiculated borders with extension to the overlying pleura antral laterally which appears retracted. Small air-fluid level within the lesion on today's study may suggest superinfection of the  lesion. Some ground-glass attenuation and septal thickening in the adjacent lung parenchyma is compatible with local infiltration of tumor and/or mild postobstructive changes. No other definite suspicious appearing pulmonary nodules or masses are noted. No acute consolidative airspace disease. No pleural effusions. Diffuse bronchial wall thickening with moderate centrilobular and paraseptal emphysema. Upper Abdomen: Aortic atherosclerosis. Mild diffuse low attenuation throughout the visualized hepatic parenchyma, indicative of hepatic steatosis. Musculoskeletal: There are no aggressive appearing lytic or blastic lesions noted in the visualized portions of the skeleton. IMPRESSION: 1. Enlarging thick-walled cavitary mass in the left upper lobe which currently measures 4.3 x 6.8 x 4.6 cm with probable metastatic lymphadenopathy in the AP  window, as above. 2. Diffuse bronchial wall thickening with moderate centrilobular and paraseptal emphysema; imaging findings suggestive of underlying COPD. 3. Aortic atherosclerosis, in addition to left main and 3 vessel coronary artery disease. Please note that although the presence of coronary artery calcium documents the presence of coronary artery disease, the severity of this disease and any potential stenosis cannot be assessed on this non-gated CT examination. Assessment for potential risk factor modification, dietary therapy or pharmacologic therapy may be warranted, if clinically indicated. 4. There are calcifications of the aortic valve. Echocardiographic correlation for evaluation of potential valvular dysfunction may be warranted if clinically indicated. 5. Hepatic steatosis. Aortic Atherosclerosis (ICD10-I70.0) and Emphysema (ICD10-J43.9). Electronically Signed   By: Vinnie Langton M.D.   On: 12/22/2020 10:40   DG C-ARM BRONCHOSCOPY  Result Date: 12/23/2020 C-ARM BRONCHOSCOPY: Fluoroscopy was utilized by the requesting physician.  No radiographic interpretation.      ASSESSMENT:  1.  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 Halford Chessman for left upper lobe lung mass. -Is current active smoker, 1/2 pack/day for 35 years. -CT lung cancer screening scan on 10/16/2020 showed cavitary mass in the anterior left upper lobe measuring 4.4 x 3.0 x 3.5 cm with 2.5 cm solid nodular component inferomedially. This abuts and involves the anterior pleura/chest wall. Expansile lytic lesion in the right posterolateral ninth and 10th ribs (unchanged dating back to CT from 06/16/2016). 10 mm short axis AP window node suspicious for nodal metastasis. -No change in baseline cough. No hemoptysis. No chest pain. - Brain MRI was negative for metastatic disease. - PET scan on 12/14/2020 showed left upper lobe mass measuring 3.9 x 4 cm with SUV 17.1.  Some contiguous hypermetabolism along the left chest wall including anterolateral left third rib with SUV 5.4.  No gross osseous destruction.  AP window lymph node of 9 mm, mildly hypermetabolic, SUV 2.8. - On 0/63/0160 left upper lobe brushing and lavage consistent with squamous cell carcinoma. - Left upper lobe fine-needle aspiration was also consistent with squamous cell carcinoma.  Lymph node could not be reached.  2. Social/family history: -Previously worked in [employer] fund[employer], currently on disability. -Current active smoker, 1/2 pack/day for 35 years. -Drinks 6 pack of beers along with 1 quart of liquor every day. -1 brother had throat cancer and another brother had lung cancer.   PLAN:  1.  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 Roxan Hockey.  He was believed to be not an upfront candidate for surgery. - We discussed the role of neoadjuvant chemotherapy.  We discussed results of Checkmate 816 trial which had Durvalumab combined with chemotherapy for 3 cycles, producing complete pathological response of 24%. - We discussed chemotherapy with  carboplatin and paclitaxel.  Because of his overall health, I think it will be difficult for him to tolerate cisplatin-based regimen. - We talked about chemotherapy regimen with carboplatin, paclitaxel and nivolumab every 3 weeks for 3 cycles. - We discussed side effects of this regimen in detail. - We will make a referral for port placement. - We will plan to rescan him after completion of 3 cycles of chemoimmunotherapy, with potential of getting him to surgery within 6 weeks after completion of cycle 3.  2. Smoking cessation: - Continue Wellbutrin. - He is continuing to smoke a pack a day.  He will need to quit completely prior to his surgery. - We are in the process of getting him nicotine patch.  Orders placed this encounter:  No orders of the defined types were placed in this encounter.    Derek Jack, MD [place] [phone]   I, Thana Ates, am acting as a scribe for Dr. Derek Jack.  I, Derek Jack MD, have reviewed the above documentation for accuracy and completeness, and I agree with the above.

## 2020-12-31 ENCOUNTER — Other Ambulatory Visit: Payer: Self-pay

## 2020-12-31 ENCOUNTER — Other Ambulatory Visit: Payer: Self-pay | Admitting: *Deleted

## 2020-12-31 ENCOUNTER — Encounter (HOSPITAL_COMMUNITY): Payer: Self-pay | Admitting: Hematology

## 2020-12-31 ENCOUNTER — Encounter (HOSPITAL_COMMUNITY): Payer: Self-pay

## 2020-12-31 ENCOUNTER — Inpatient Hospital Stay (HOSPITAL_BASED_OUTPATIENT_CLINIC_OR_DEPARTMENT_OTHER): Payer: Medicare PPO | Admitting: Hematology

## 2020-12-31 VITALS — BP 146/62 | HR 99 | Temp 97.3°F | Resp 18 | Wt 159.1 lb

## 2020-12-31 DIAGNOSIS — C3492 Malignant neoplasm of unspecified part of left bronchus or lung: Secondary | ICD-10-CM

## 2020-12-31 DIAGNOSIS — J432 Centrilobular emphysema: Secondary | ICD-10-CM | POA: Diagnosis not present

## 2020-12-31 DIAGNOSIS — R200 Anesthesia of skin: Secondary | ICD-10-CM | POA: Diagnosis not present

## 2020-12-31 DIAGNOSIS — K760 Fatty (change of) liver, not elsewhere classified: Secondary | ICD-10-CM | POA: Diagnosis not present

## 2020-12-31 DIAGNOSIS — C3412 Malignant neoplasm of upper lobe, left bronchus or lung: Secondary | ICD-10-CM | POA: Diagnosis not present

## 2020-12-31 DIAGNOSIS — I700 Atherosclerosis of aorta: Secondary | ICD-10-CM | POA: Diagnosis not present

## 2020-12-31 DIAGNOSIS — R599 Enlarged lymph nodes, unspecified: Secondary | ICD-10-CM | POA: Diagnosis not present

## 2020-12-31 DIAGNOSIS — I6523 Occlusion and stenosis of bilateral carotid arteries: Secondary | ICD-10-CM | POA: Diagnosis not present

## 2020-12-31 DIAGNOSIS — I2510 Atherosclerotic heart disease of native coronary artery without angina pectoris: Secondary | ICD-10-CM | POA: Diagnosis not present

## 2020-12-31 DIAGNOSIS — R5383 Other fatigue: Secondary | ICD-10-CM | POA: Diagnosis not present

## 2020-12-31 NOTE — Patient Instructions (Signed)
[place] at [facility] Discharge Instructions  You were seen and examined today by Dr. Delton Coombes. Dr. Delton Coombes has discussed the plan of care with Dr. Roxan Hockey. The recommended plan of care is to have three cycles of chemotherapy with immunotherapy prior to surgical resection. Chemotherapy and immunotherapy combination has welded good response in a recent trial. Chemotherapy and immunotherapy are given safely through a Port-A-Cath in the [facility] once every 3 weeks.   Thank you for choosing [place] at [facility] to provide your oncology and hematology care.  To afford each patient quality time with our provider, please arrive at least 15 minutes before your scheduled appointment time.   If you have a lab appointment with the [facility] please come in thru the Main Entrance and check in at the main information desk.  You need to re-schedule your appointment should you arrive 10 or more minutes late.  We strive to give you quality time with our providers, and arriving late affects you and other patients whose appointments are after yours.  Also, if you no show three or more times for appointments you may be dismissed from the clinic at the providers discretion.     Again, thank you for choosing [facility].  Our hope is that these requests will decrease the amount of time that you wait before being seen by our physicians.       _____________________________________________________________  Should you have questions after your visit to [facility], please contact our office at [phone] and follow the prompts.  Our office hours are 8:00 a.m. and 4:30 p.m. Monday - Friday.  Please note that voicemails left after 4:00 p.m. may not be returned until the following business day.  We are closed weekends and major holidays.  You do have access to a nurse 24-7, just call the main number to the clinic [phone] and  do not press any options, hold on the line and a nurse will answer the phone.    For prescription refill requests, have your pharmacy contact our office and allow 72 hours.    Due to Covid, you will need to wear a mask upon entering the hospital. If you do not have a mask, a mask will be given to you at the Main Entrance upon arrival. For doctor visits, patients may have 1 support person age 29 or older with them. For treatment visits, patients can not have anyone with them due to social distancing guidelines and our immunocompromised population.

## 2020-12-31 NOTE — Progress Notes (Signed)
Caris testing ordered per Dr. Delton Coombes

## 2020-12-31 NOTE — Progress Notes (Signed)
The proposed treatment discussed in cancer conference is for discussion purpose only and is not a binding recommendation.  The patient was not physically examined nor present for their treatment options. Therefore, final treatment plans cannot be decided.   Dr. Delton Coombes updated on recommendations.

## 2021-01-01 ENCOUNTER — Encounter (HOSPITAL_COMMUNITY): Payer: Self-pay | Admitting: Hematology

## 2021-01-01 ENCOUNTER — Encounter (HOSPITAL_COMMUNITY): Payer: Self-pay

## 2021-01-01 DIAGNOSIS — C3492 Malignant neoplasm of unspecified part of left bronchus or lung: Secondary | ICD-10-CM | POA: Insufficient documentation

## 2021-01-01 DIAGNOSIS — Z95828 Presence of other vascular implants and grafts: Secondary | ICD-10-CM

## 2021-01-01 HISTORY — DX: Presence of other vascular implants and grafts: Z95.828

## 2021-01-01 MED ORDER — LIDOCAINE-PRILOCAINE 2.5-2.5 % EX CREA: TOPICAL_CREAM | CUTANEOUS | 3 refills | Status: DC

## 2021-01-01 MED ORDER — PROCHLORPERAZINE MALEATE 10 MG PO TABS: 10.0000 mg | ORAL_TABLET | Freq: Four times a day (QID) | ORAL | 1 refills | Status: DC | PRN

## 2021-01-01 NOTE — Patient Instructions (Signed)
[facility] Chemotherapy Teaching   You are diagnosed with Stage IIB non-small cell lung cancer of the left lung.  You will be treated in the clinic every 3 weeks with a combination of chemotherapy and immunotherapy drugs.  Those drugs are paclitaxel (Taxol), carboplatin, and nivolumab (Opdivo).  You will receive three cycles of this chemoimmunotherapy regimen, then be reevaluated for surgical removal of this tumor.  The intent of treatment is cure.  You will see the doctor regularly throughout treatment.  We will obtain blood work from you prior to every treatment and monitor your results to make sure it is safe to give your treatment. The doctor monitors your response to treatment by the way you are feeling, your blood work, and by obtaining scans periodically.  There will be wait times while you are here for treatment.  It will take about 30 minutes to 1 hour for your lab work to result.  Then there will be wait times while pharmacy mixes your medications.   Medications you will receive in the clinic prior to your chemotherapy medications:   Aloxi:  ALOXI is used in adults to help prevent the nausea and vomiting that happens with certain chemotherapy drugs.  Aloxi is a long acting medication, and will remain in your system for about 2 days.   Emend:  Emend is an anti-nausea medication used together with Aloxi to prevent nausea and vomiting that may be caused by chemotherapy.   Dexamethasone:  This is a steroid given prior to chemotherapy to help prevent allergic reactions; it may also help prevent and control nausea and diarrhea.    Pepcid:  This medication is a histamine blocker that helps prevent and allergic reaction to your chemotherapy.    Benadryl:  This is a histamine blocker (different from the Pepcid) that helps prevent allergic/infusion reactions to your chemotherapy. This medication may cause dizziness/drowsiness.     Paclitaxel (Taxol)  About This Drug Paclitaxel  is a drug used to treat cancer. It is given in the vein (IV).  This will take 1 hour to infuse.  This first infusion will take longer to infuse because it is increased slowly to monitor for reactions.  The nurse will be in the room with you for the first 15 minutes of the first infusion.  Possible Side Effects  . Hair loss. Hair loss is often temporary, although with certain medicine, hair loss can sometimes be permanent. Hair loss may happen suddenly or gradually. If you lose hair, you may lose it from your head, face, armpits, pubic area, chest, and/or legs. You may also notice your hair getting thin.  . Swelling of your legs, ankles and/or feet (edema)  . Flushing  . Nausea and throwing up (vomiting)  . Loose bowel movements (diarrhea)  . Bone marrow depression. This is a decrease in the number of white blood cells, red blood cells, and platelets. This may raise your risk of infection, make you tired and weak (fatigue), and raise your risk of bleeding.  . Effects on the nerves are called peripheral neuropathy. You may feel numbness, tingling, or pain in your hands and feet. It may be hard for you to button your clothes, open jars, or walk as usual. The effect on the nerves may get worse with more doses of the drug. These effects get better in some people after the drug is stopped but it does not get better in all people.  . Changes in your liver function  . Bone,  joint and muscle pain  . Abnormal EKG  . Allergic reaction: Allergic reactions, including anaphylaxis are rare but may happen in some patients. Signs of allergic reaction to this drug may be swelling of the face, feeling like your tongue or throat are swelling, trouble breathing, rash, itching, fever, chills, feeling dizzy, and/or feeling that your heart is beating in a fast or not normal way. If this happens, do not take another dose of this drug. You should get urgent medical treatment.  . Infection  . Changes in your kidney  function.  Note: Each of the side effects above was reported in 20% or greater of patients treated with paclitaxel. Not all possible side effects are included above.  Warnings and Precautions  . Severe allergic reactions  . Severe bone marrow depression  Treating Side Effects  . To help with hair loss, wash with a mild shampoo and avoid washing your hair every day.  . Avoid rubbing your scalp, instead, pat your hair or scalp dry  . Avoid coloring your hair  . Limit your use of hair spray, electric curlers, blow dryers, and curling irons.  . If you are interested in getting a wig, talk to your nurse. You can also call the [facility] at 800-ACS-2345 to find out information about the "Look Good, Feel Better" program close to where you live. It is a free program where women getting chemotherapy can learn about wigs, turbans and scarves as well as makeup techniques and skin and nail care.  . Ask your doctor or nurse about medicines that are available to help stop or lessen diarrhea and/or nausea.  . To help with nausea and vomiting, eat small, frequent meals instead of three large meals a day. Choose foods and drinks that are at room temperature. Ask your nurse or doctor about other helpful tips and medicine that is available to help or stop lessen these symptoms.  . If you get diarrhea, eat low-fiber foods that are high in protein and calories and avoid foods that can irritate your digestive tracts or lead to cramping. Ask your nurse or doctor about medicine that can lessen or stop your diarrhea.  . Mouth care is very important. Your mouth care should consist of routine, gentle cleaning of your teeth or dentures and rinsing your mouth with a mixture of 1/2 teaspoon of salt in 8 ounces of water or  teaspoon of baking soda in 8 ounces of water. This should be done at least after each meal and at bedtime.  . If you have mouth sores, avoid mouthwash that has alcohol. Also avoid  alcohol and smoking because they can bother your mouth and throat.  . Drink plenty of fluids (a minimum of eight glasses per day is recommended).  . Take your temperature as your doctor or nurse tells you, and whenever you feel like you may have a fever.  . Talk to your doctor or nurse about precautions you can take to avoid infections and bleeding.  . Be careful when cooking, walking, and handling sharp objects and hot liquids.  Food and Drug Interactions  . There are no known interactions of paclitaxel with food.  . This drug may interact with other medicines. Tell your doctor and pharmacist about all the medicines and dietary supplements (vitamins, minerals, herbs and others) that you are taking at this time.  . The safety and use of dietary supplements and alternative diets are often not known. Using these might affect your cancer or  interfere with your treatment. Until more is known, you should not use dietary supplements or alternative diets without your cancer doctor's help.  When to Call the Doctor  Call your doctor or nurse if you have any of the following symptoms and/or any new or unusual symptoms:  . Fever of 100.5 F (38 C) or above  . Chills  . Redness, pain, warmth, or swelling at the IV site during the infusion  . Signs of allergic reaction: swelling of the face, feeling like your tongue or throat are swelling, trouble breathing, rash, itching, fever, chills, feeling dizzy, and/or feeling that your heart is beating in a fast or not normal way  . Feeling that your heart is beating in a fast or not normal way (palpitations)  . Weight gain of 5 pounds in one week (fluid retention)  . Decreased urine or very dark urine  . Signs of liver problems: dark urine, pale bowel movements, bad stomach pain, feeling very tired and weak, unusual  itching, or yellowing of the eyes or skin  . Heavy menstrual period that lasts longer than normal  . Easy bruising or bleeding  .  Nausea that stops you from eating or drinking, and/or that is not relieved by prescribed medicines.  . Loose bowel movements (diarrhea) more than 4 times a day or diarrhea with weakness or lightheadedness  . Pain in your mouth or throat that makes it hard to eat or drink  . Lasting loss of appetite or rapid weight loss of five pounds in a week  . Signs of peripheral neuropathy: numbness, tingling, or decreased feeling in fingers or toes; trouble walking or changes in the way you walk; or feeling clumsy when buttoning clothes, opening jars, or other routine activities  . Joint and muscle pain that is not relieved by prescribed medicines  . Extreme fatigue that interferes with normal activities  . While you are getting this drug, please tell your nurse right away if you have any pain, redness, or swelling at the site of the IV infusion.  . If you think you are pregnant.  Reproduction Warnings  . Pregnancy warning: This drug may have harmful effects on the unborn child, it is recommended that effective methods of birth control should be used during your cancer treatment. Let your doctor know right away if you think you may be pregnant.  . Breast feeding warning: Women should not breast feed during treatment because this drug could enter the breastmilk and cause harm to a breast feeding baby.   Carboplatin (Paraplatin, CBDCA)  About This Drug  Carboplatin is used to treat cancer. It is given in the vein (IV).  It will take 30 minutes to infuse.   Possible Side Effects  . Bone marrow suppression. This is a decrease in the number of white blood cells, red blood cells, and platelets. This may raise your risk of infection, make you tired and weak (fatigue), and raise your risk of bleeding.  . Nausea and vomiting (throwing up)  . Weakness  . Changes in your liver function  . Changes in your kidney function  . Electrolyte changes  . Pain  Note: Each of the side effects above  was reported in 20% or greater of patients treated with carboplatin. Not all possible side effects are included above.   Warnings and Precautions  . Severe bone marrow suppression  . Allergic reactions, including anaphylaxis are rare but may happen in some patients. Signs of allergic reaction to this drug  may be swelling of the face, feeling like your tongue or throat are swelling, trouble breathing, rash, itching, fever, chills, feeling dizzy, and/or feeling that your heart is beating in a fast or not normal way. If this happens, do not take another dose of this drug. You should get urgent medical treatment.  . Severe nausea and vomiting  . Effects on the nerves are called peripheral neuropathy. This risk is increased if you are over the age of 52 or if you have received other medicine with risk of peripheral neuropathy. You may feel numbness, tingling, or pain in your hands and feet. It may be hard for you to button your clothes, open jars, or walk as usual. The effect on the nerves may get worse with more doses of the drug. These effects get better in some people after the drug is stopped but it does not get better in all people.  Marland Kitchen Blurred vision, loss of vision or other changes in eyesight  . Decreased hearing   - Skin and tissue irritation including redness, pain, warmth, or swelling at the IV site if the drug leaks out of the vein and into nearby tissue.  . Severe changes in your kidney function, which can cause kidney failure  . Severe changes in your liver function, which can cause liver failure  Note: Some of the side effects above are very rare. If you have concerns and/or questions, please discuss them with your medical team.   Important Information  . This drug may be present in the saliva, tears, sweat, urine, stool, vomit, semen, and vaginal secretions. Talk to your doctor and/or your nurse about the necessary precautions to take during this time.   Treating Side  Effects  . Manage tiredness by pacing your activities for the day.  . Be sure to include periods of rest between energy-draining activities.  . To decrease the risk of infection, wash your hands regularly.  . Avoid close contact with people who have a cold, the flu, or other infections.  . Take your temperature as your doctor or nurse tells you, and whenever you feel like you may have a fever.  . To help decrease the risk of bleeding, use a soft toothbrush. Check with your nurse before using dental floss.  . Be very careful when using knives or tools.  . Use an electric shaver instead of a razor.  . Drink plenty of fluids (a minimum of eight glasses per day is recommended).  . If you throw up or have loose bowel movements, you should drink more fluids so that you do not become dehydrated (lack of water in the body from losing too much fluid).  . To help with nausea and vomiting, eat small, frequent meals instead of three large meals a day. Choose foods and drinks that are at room temperature. Ask your nurse or doctor about other helpful tips and medicine that is available to help stop or lessen these symptoms.  . If you have numbness and tingling in your hands and feet, be careful when cooking, walking, and handling sharp objects and hot liquids.  Marland Kitchen Keeping your pain under control is important to your well-being. Please tell your doctor or nurse if you are experiencing pain.   Food and Drug Interactions  . There are no known interactions of carboplatin with food.  . This drug may interact with other medicines. Tell your doctor and pharmacist about all the prescription and over-the-counter medicines and dietary supplements (vitamins, minerals, herbs  and others) that you are taking at this time. Also, check with your doctor or pharmacist before starting any new prescription or over-the-counter medicines, or dietary supplements to make sure that there are no interactions.   When to  Call the Doctor  Call your doctor or nurse if you have any of these symptoms and/or any new or unusual symptoms:  . Fever of 100.4 F (38 C) or higher  . Chills  . Tiredness that interferes with your daily activities  . Feeling dizzy or lightheaded  . Easy bleeding or bruising  . Nausea that stops you from eating or drinking and/or is not relieved by prescribed medicines  . Throwing up  . Blurred vision or other changes in eyesight  . Decrease in hearing or ringing in the ear  . Signs of allergic reaction: swelling of the face, feeling like your tongue or throat are swelling, trouble breathing, rash, itching, fever, chills, feeling dizzy, and/or feeling that your heart is beating in a fast or not normal way. If this happens, call 911 for emergency care.  . Signs of possible liver problems: dark urine, pale bowel movements, bad stomach pain, feeling very tired and weak, unusual itching, or yellowing of the eyes or skin  . Decreased urine, or very dark urine  . Numbness, tingling, or pain in your hands and feet  . Pain that does not go away or is not relieved by prescribed medicine  . While you are getting this drug, please tell your nurse right away if you have any pain, redness, or swelling at the site of the IV infusion, or if you have any new onset of symptoms, or if you just feel "different" from before when the infusion was started.   Reproduction Warnings  . Pregnancy warning: This drug may have harmful effects on the unborn baby. Women of child bearing potential should use effective methods of birth control during your cancer treatment. Let your doctor know right away if you think you may be pregnant.  . Breastfeeding warning: It is not known if this drug passes into breast milk. For this reason, women should not breastfeed during treatment because this drug could enter the breast milk and cause harm to a breastfeeding baby.  . Fertility warning: Human fertility  studies have not been done with this drug. Talk with your doctor or nurse if you plan to have children. Ask for information on sperm or egg banking.   Nivolumab (Opdivo)  About This Drug Nivolumab is used to treat cancer. It is given in the vein (IV). It will take 30 minutes to infuse.   Possible Side Effects . Nausea and vomiting  . Pain in your abdomen  . Diarrhea (loose bowel movements)  . Constipation (not able to move bowels)  . Tiredness and weakness  . Fever  . Decreased appetite (decreased hunger)  . Joint, muscle and bone pain  . Back pain  . Headache  . Cough and trouble breathing  . Upper respiratory tract infection  . Urinary tract infection  . Rash and itching  Note: Each of the side effects above was reported in 20% or greater of patients treated with nivolumab alone. Your side effects may be different or more severe if you receive nivolumab in combination with other chemotherapy agents. Not all possible side effects are included above.  Warnings and Precautions . This drug works with your immune system and can cause inflammation in any of your organs and tissues and can change how  they work. This may put you at risk for developing serious medical problems which can be life-threatening. These side effects may require treatment with steroids at the discretion of your doctor.  . Inflammation (swelling) of the lungs which can be life-threatening. You may have a dry cough or trouble breathing.  . Colitis, which is swelling (inflammation) in the colon. The symptoms are loose bowel movements (diarrhea), stomach cramping, and sometimes blood in the bowel movements.  . Changes in your central nervous system can happen. The central nervous system is made up of your brain and spinal cord. You could feel extreme tiredness, agitation, confusion, hallucinations (see or hear things that are not there), trouble understanding or speaking, loss of control of your bowels or  bladder, eyesight changes, numbness or lack of strength to your arms, legs, face, or body, and coma. If you start to have any of these symptoms let your doctor know right away.  . Severe changes in your liver function, which can cause liver failure and be life-threatening.  . This drug may affect some of your hormone glands (especially the thyroid, adrenals, pituitary, and pancreas).  . Blood sugar levels may change, and you may develop diabetes. If you already have diabetes, changes may need to be made to your diabetes medication.  . Changes in your kidney function  . Allergic skin reaction which can very rarely be life-threatening. You may develop blisters on your skin that are filled with fluid or a severe red rash all over your body that may be painful.  . While you are getting this drug in your vein (IV), you may have a reaction to the drug. Sometimes you may be given medication to stop or lessen these side effects. Your nurse will check you closely for these signs: fever or shaking chills, flushing, facial swelling, feeling dizzy, headache, trouble breathing, rash, itching, chest tightness, or chest pain. These reactions may happen after your infusion. If this happens, call 911 for emergency care.  . Increased risk of serious complications that can be life-threatening such as graft versus host disease (GVHD) in patients who undergo a stem cell transplant before or after receiving nivolumab.  . Increased risk of organ rejection in patients who have received donor organs  Important Information . This drug may be present in the saliva, tears, sweat, urine, stool, vomit, semen, and vaginal secretions. Talk to your doctor and/or your nurse about the necessary precautions to take during this time.  Treating Side Effects . Manage tiredness by pacing your activities for the day.  . Be sure to include periods of rest between energy-draining activities.  . Get regular exercise. If you feel too  tired to exercise vigorously, try taking a short walk.  . To help decrease the risk of bleeding, use a soft toothbrush. Check with your nurse before using dental floss.  . Be very careful when using knives or tools.  . Use an electric shaver instead of a razor.  . Drink plenty of fluids (a minimum of eight glasses per day is recommended).  . To help with nausea and vomiting, eat small, frequent meals instead of three large meals a day. Choose foods and drinks that are at room temperature.  . If you throw up or have diarrhea, you should drink more fluids so that you do not become dehydrated (lack of water in the body from losing too much fluid).  . If you have diarrhea, eat low-fiber foods that are high in protein and calories, and  avoid foods that can irritate your digestive tracts or lead to cramping.  . If you are not able to move your bowels, check with your doctor or nurse before you use any enemas, laxatives, or suppositories.  . Ask your doctor or nurse about medicine that is available to help stop or lessen diarrhea, constipation, and/or nausea/vomiting.  . To help with decreased appetite, eat small, frequent meals. Eat foods high in calories and protein, such as meat, poultry, fish, dry beans, tofu, eggs, nuts, milk, yogurt, cheese, ice cream, pudding, and nutritional supplements.  . Consider using sauces and spices to increase taste. Daily exercise, with your doctor's approval, may increase your appetite.  . If you have diabetes, keep good control of your blood sugar level. Tell your nurse or your doctor if your glucose levels are higher or lower than normal.  . Keeping your pain under control is important to your well-being. Please tell your doctor or nurse if you are experiencing pain.  . If you get a rash do not put anything on it unless your doctor or nurse says you may. Keep the area around the rash clean and dry. Ask your doctor for medicine if your rash bothers you.  .  To help with itching, moisturize your skin several times a day.  . Avoid sun exposure and apply sunscreen routinely when outdoors.  . If you have numbness and tingling in your hands and feet, be careful when cooking, walking, and handling sharp objects and hot liquids.  . Infusion reactions may happen after your infusion. If this happens, call 911 for emergency care.  Food and Drug Interactions . There are no known interactions of nivolumab with food. . This drug may interact with other medicines. Tell your doctor and pharmacist about all the prescription and over-the-counter medicines and dietary supplements (vitamins, minerals, herbs, and others) that you are taking at this time. Also, check with your doctor or pharmacist before starting any new prescription or over-the-counter medicines, or dietary supplements to make sure that there are no interactions.  When to Call the Doctor Not all possible side effects are included. Some of these side effects, although rare, can be lifethreatening.  Lung problems: . Inflammation of the lungs . Cough . Trouble breathing . Upper respiratory tract infection  Call your doctor or nurse if you have any of these symptoms: . Wheezing or trouble breathing . New or worsening cough . Chest pain . Coughing up yellow, green, or blood mucus  Stomach problems: . Decreased appetite (decreased hunger) . Nausea and vomiting (throwing up) . Diarrhea (loose bowel movements) . Constipation (unable to move bowels) . Pain in your abdomen . Inflammation of your colon . Blood in your stool  Call your doctor or nurse if you have any of these symptoms: . Nausea that stops you from eating or drinking or is not relieved by prescribed medicines . Throwing up more than 3 times a day . Lasting loss of appetite or rapid weight loss of five pounds in a week . Diarrhea, 4 times in one day or diarrhea with lack of strength or a feeling of being dizzy . No bowel  movement for 3 days or you feel uncomfortable . Pain in your abdomen that does not go away . Blood in your stool (bright red or black/tarry)  Liver problems: . Changes in your liver function  Call your doctor or nurse if you have any of these symptoms: . Yellowing of the eyes or skin . Dark  urine . Pale bowel movements . Pain on the right side of your abdomen that does not go away . Feeling very tired and weak . Unusual itching . Easy bleeding or bruising  Hormone gland problems: . Changes in some of your hormone glands (especially the thyroid, adrenals, pituitary, and pancreas) . Blood sugar levels may change, and you may develop diabetes  Call your doctor or nurse if you have any of these symptoms: . Headache that does not go away . Tiredness and weakness that interferes with your daily activities . Feeling dizzy or lightheaded . Changes in mood or behavior such as irritability and/or feeling forgetful . Shakiness . Weight loss or weight gain . Nausea . Abnormal blood sugar . Unusual thirst or passing urine often . Feeling cold  Kidney problems: . Changes in your kidney function . Urinary tract infection  Call your doctor or nurse if you have any of these symptoms: . Decreased or very dark urine . Cloudy urine and/or urine that smells bad . Difficulty urinating . Pain or burning when you pass urine . Feeling like you have to pass urine often, but not much comes out when you do . Tender or heavy feeling in your lower abdomen . Pain on one side of your back under your ribs  Skin problems: . Rash and itching . Soreness of the mouth and throat . Allergic skin reaction  Call your doctor or nurse if you have any of these symptoms: . New rash and/or itching . Fluid-filled bumps/blisters . Rash that is not relieved by prescribed medicines . Red areas, white patches, or sores in your mouth that hurt  Inflammation of the brain: . Changes in your brain and spinal  cord . Headache . Effects on the nerves  Call your doctor or nurse if you have any of these symptoms: . Headache that does not go away . Extreme tiredness, agitation, or confusion . Seizures . Hallucinations . Trouble understanding or speaking . Loss of control of bowels or bladder . Numbness or lack of strength to your arms, legs, face, or body . Numbness, tingling, pins, and needles, or pain in your arms, hands, legs, or feet  Other problems: . Low red blood cells, and platelets . Fever . Inflammation of your eye and/or other changes in vision . Allergic reaction to the drug . Heart problems . Electrolyte changes . Muscle, bone, and joint pain  Call your doctor or nurse if you have any of these symptoms: . Fever of 100.4 F (38 C) or higher . Chills, flushing . Easy bleeding or bruising . Blurred vision or other changes in eyesight . Sensitivity to light . Feeling that your heart is beating fast or in a not normal way (palpitations)  . Signs of infusion reaction: fever or shaking chills, flushing, facial swelling, feeling dizzy, headache, trouble breathing, rash, itching, chest tightness, or chest pain. If this happens, call 911 for emergency care. . Pain that does not go away, or is not relieved by prescribed medicines . Extreme muscle weakness  Reproduction Warnings . Pregnancy warning: This drug can have harmful effects on the unborn baby. Women of childbearing potential should use effective methods of birth control during your cancer treatment and for at least 5 months after stopping treatment. Let your doctor know right away if you think you may be pregnant. . Breastfeeding warning: It is not known if this drug passes into breast milk. For this reason, women should not breastfeed during treatment and for  5 months after stopping treatment because this drug could enter the breast milk and cause harm to a breastfeeding baby. . Fertility warning: Fertility studies have not  been done with this drug. Talk with your doctor or nurse if you plan to have children. Ask for information on sperm or egg banking.   SELF CARE ACTIVITIES WHILE RECEIVING CHEMOTHERAPY:  Hydration Increase your fluid intake 48 hours prior to treatment and drink at least 8 to 12 cups (64 ounces) of water/decaffeinated beverages per day after treatment. You can still have your cup of coffee or soda but these beverages do not count as part of your 8 to 12 cups that you need to drink daily. No alcohol intake.  Medications Continue taking your normal prescription medication as prescribed.  If you start any new herbal or new supplements please let us know first to make sure it is safe.  Mouth Care Have teeth cleaned professionally before starting treatment. Keep dentures and partial plates clean. Use soft toothbrush and do not use mouthwashes that contain alcohol. Biotene is a good mouthwash that is available at most pharmacies or may be ordered by calling [phone]. Use warm salt water gargles (1 teaspoon salt per 1 quart warm water) before and after meals and at bedtime. If you need dental work, please let the doctor know before you go for your appointment so that we can coordinate the best possible time for you in regards to your chemo regimen. You need to also let your dentist know that you are actively taking chemo. We may need to do labs prior to your dental appointment.  Skin Care Always use sunscreen that has not expired and with SPF (Sun Protection Factor) of 50 or higher. Wear hats to protect your head from the sun. Remember to use sunscreen on your hands, ears, face, & feet.  Use good moisturizing lotions such as udder cream, eucerin, or even Vaseline. Some chemotherapies can cause dry skin, color changes in your skin and nails.    . Avoid long, hot showers or baths. . Use gentle, fragrance-free soaps and laundry detergent. . Use moisturizers, preferably creams or ointments rather than  lotions because the thicker consistency is better at preventing skin dehydration. Apply the cream or ointment within 15 minutes of showering. Reapply moisturizer at night, and moisturize your hands every time after you wash them.  Hair Loss (if your doctor says your hair will fall out)  . If your doctor says that your hair is likely to fall out, decide before you begin chemo whether you want to wear a wig. You may want to shop before treatment to match your hair color. . Hats, turbans, and scarves can also camouflage hair loss, although some people prefer to leave their heads uncovered. If you go bare-headed outdoors, be sure to use sunscreen on your scalp. . Cut your hair short. It eases the inconvenience of shedding lots of hair, but it also can reduce the emotional impact of watching your hair fall out. . Don't perm or color your hair during chemotherapy. Those chemical treatments are already damaging to hair and can enhance hair loss. Once your chemo treatments are done and your hair has grown back, it's OK to resume dyeing or perming hair.  With chemotherapy, hair loss is almost always temporary. But when it grows back, it may be a different color or texture. In older adults who still had hair color before chemotherapy, the new growth may be completely gray.  Often, new  hair is very fine and soft.  Infection Prevention Please wash your hands for at least 30 seconds using warm soapy water. Handwashing is the #1 way to prevent the spread of germs. Stay away from sick people or people who are getting over a cold. If you develop respiratory systems such as green/yellow mucus production or productive cough or persistent cough let us know and we will see if you need an antibiotic. It is a good idea to keep a pair of gloves on when going into grocery stores/[employer] to decrease your risk of coming into contact with germs on the carts, etc. Carry alcohol hand gel with you at all times and use it frequently  if out in public. If your temperature reaches 100.4 or higher please call the clinic and let us know.  If it is after hours or on the weekend please go to the ER if your temperature is over 100.4.  Please have your own personal thermometer at home to use.    Sex and bodily fluids If you are going to have sex, a condom must be used to protect the person that isn't taking chemotherapy. Chemo can decrease your libido (sex drive). For a few days after chemotherapy, chemotherapy can be excreted through your bodily fluids.  When using the toilet please close the lid and flush the toilet twice.  Do this for a few day after you have had chemotherapy.   Effects of chemotherapy on your sex life Some changes are simple and won't last long. They won't affect your sex life permanently.  Sometimes you may feel: . too tired . not strong enough to be very active . sick or sore  . not in the mood . anxious or low  Your anxiety might not seem related to sex. For example, you may be worried about the cancer and how your treatment is going. Or you may be worried about money, or about how you family are coping with your illness.  These things can cause stress, which can affect your interest in sex. It's important to talk to your partner about how you feel.  Remember - the changes to your sex life don't usually last long. There's usually no medical reason to stop having sex during chemo. The drugs won't have any long term physical effects on your performance or enjoyment of sex. Cancer can't be passed on to your partner during sex  Contraception It's important to use reliable contraception during treatment. Avoid getting pregnant while you or your partner are having chemotherapy. This is because the drugs may harm the baby. Sometimes chemotherapy drugs can leave a man or woman infertile.  This means you would not be able to have children in the future. You might want to talk to someone about permanent infertility. It  can be very difficult to learn that you may no longer be able to have children. Some people find counselling helpful. There might be ways to preserve your fertility, although this is easier for men than for women. You may want to speak to a fertility expert. You can talk about sperm banking or harvesting your eggs. You can also ask about other fertility options, such as donor eggs. If you have or have had breast cancer, your doctor might advise you not to take the contraceptive pill. This is because the hormones in it might affect the cancer. It is not known for sure whether or not chemotherapy drugs can be passed on through semen or secretions from the vagina.  Because of this some doctors advise people to use a barrier method if you have sex during treatment. This applies to vaginal, anal or oral sex. Generally, doctors advise a barrier method only for the time you are actually having the treatment and for about a week after your treatment. Advice like this can be worrying, but this does not mean that you have to avoid being intimate with your partner. You can still have close contact with your partner and continue to enjoy sex.  Animals If you have cats or birds we just ask that you not change the litter or change the cage.  Please have someone else do this for you while you are on chemotherapy.   Food Safety During and After Cancer Treatment Food safety is important for people both during and after cancer treatment. Cancer and cancer treatments, such as chemotherapy, radiation therapy, and stem cell/bone marrow transplantation, often weaken the immune system. This makes it harder for your body to protect itself from foodborne illness, also called food poisoning. Foodborne illness is caused by eating food that contains harmful bacteria, parasites, or viruses.  Foods to avoid Some foods have a higher risk of becoming tainted with bacteria. These include: Marland Kitchen Unwashed fresh fruit and vegetables, especially  leafy vegetables that can hide dirt and other contaminants . Raw sprouts, such as alfalfa sprouts . Raw or undercooked beef, especially ground beef, or other raw or undercooked meat and poultry . Fatty, fried, or spicy foods immediately before or after treatment.  These can sit heavy on your stomach and make you feel nauseous. . Raw or undercooked shellfish, such as oysters. . Sushi and sashimi, which often contain raw fish.  . Unpasteurized beverages, such as unpasteurized fruit juices, raw milk, raw yogurt, or cider . Undercooked eggs, such as soft boiled, over easy, and poached; raw, unpasteurized eggs; or foods made with raw egg, such as homemade raw cookie dough and homemade mayonnaise  Simple steps for food safety  Shop smart. . Do not buy food stored or displayed in an unclean area. . Do not buy bruised or damaged fruits or vegetables. . Do not buy cans that have cracks, dents, or bulges. . Pick up foods that can spoil at the end of your shopping trip and store them in a cooler on the way home.  Prepare and clean up foods carefully. . Rinse all fresh fruits and vegetables under running water, and dry them with a clean towel or paper towel. . Clean the top of cans before opening them. . After preparing food, wash your hands for 20 seconds with hot water and soap. Pay special attention to areas between fingers and under nails. . Clean your utensils and dishes with hot water and soap. Marland Kitchen Disinfect your kitchen and cutting boards using 1 teaspoon of liquid, unscented bleach mixed into 1 quart of water.    Dispose of old food. . Eat canned and packaged food before its expiration date (the "use by" or "best before" date). . Consume refrigerated leftovers within 3 to 4 days. After that time, throw out the food. Even if the food does not smell or look spoiled, it still may be unsafe. Some bacteria, such as Listeria, can grow even on foods stored in the refrigerator if they are kept for too  long.  Take precautions when eating out. . At restaurants, avoid buffets and salad bars where food sits out for a long time and comes in contact with many people. Food can become  contaminated when someone with a virus, often a norovirus, or another "bug" handles it. . Put any leftover food in a "to-go" container yourself, rather than having the server do it. And, refrigerate leftovers as soon as you get home. . Choose restaurants that are clean and that are willing to prepare your food as you order it cooked.   AT HOME MEDICATIONS:                                                                                                                                                                Compazine/Prochlorperazine 10mg  tablet. Take 1 tablet every 6 hours as needed for nausea/vomiting. (This can make you sleepy)   EMLA cream. Apply a quarter size amount to port site 1 hour prior to chemo. Do not rub in. Cover with plastic wrap.    Diarrhea Sheet   If you are having loose stools/diarrhea, please purchase Imodium and begin taking as outlined:  At the first sign of poorly formed or loose stools you should begin taking Imodium (loperamide) 2 mg capsules.  Take two tablets (4mg ) followed by one tablet (2mg ) every 2 hours - DO NOT EXCEED 8 tablets in 24 hours.  If it is bedtime and you are having loose stools, take 2 tablets at bedtime, then 2 tablets every 4 hours until morning.   Always call the [facility] if you are having loose stools/diarrhea that you can't get under control.  Loose stools/diarrhea leads to dehydration (loss of water) in your body.  We have other options of trying to get the loose stools/diarrhea to stop but you must let us know!   Constipation Sheet  Colace - 100 mg capsules - take 2 capsules daily.  If this doesn't help then you can increase to 2 capsules twice daily.  Please call if the above does not work for you. Do not go more than 2 days without a bowel movement.   It is very important that you do not become constipated.  It will make you feel sick to your stomach (nausea) and can cause abdominal pain and vomiting.  Nausea Sheet   Compazine/Prochlorperazine 10mg  tablet. Take 1 tablet every 6 hours as needed for nausea/vomiting (This can make you drowsy).  If you are having persistent nausea (nausea that does not stop) please call the [facility] and let us know the amount of nausea that you are experiencing.  If you begin to vomit, you need to call the [facility] and if it is the weekend and you have vomited more than one time and can't get it to stop-go to the Emergency Room.  Persistent nausea/vomiting can lead to dehydration (loss of fluid in your body) and will make you feel very weak and unwell. Ice chips, sips of  clear liquids, foods that are at room temperature, crackers, and toast tend to be better tolerated.   SYMPTOMS TO REPORT AS SOON AS POSSIBLE AFTER TREATMENT:  FEVER GREATER THAN 100.4 F  CHILLS WITH OR WITHOUT FEVER  NAUSEA AND VOMITING THAT IS NOT CONTROLLED WITH YOUR NAUSEA MEDICATION  UNUSUAL SHORTNESS OF BREATH  UNUSUAL BRUISING OR BLEEDING  TENDERNESS IN MOUTH AND THROAT WITH OR WITHOUT PRESENCE OF ULCERS  URINARY PROBLEMS  BOWEL PROBLEMS  UNUSUAL RASH      Wear comfortable clothing and clothing appropriate for easy access to any Portacath or PICC line. Let us know if there is anything that we can do to make your therapy better!    What to do if you need assistance after hours or on the weekends: CALL [phone].  HOLD on the line, do not hang up.  You will hear multiple messages but at the end you will be connected with a nurse triage line.  They will contact the doctor if necessary.  Most of the time they will be able to assist you.  Do not call the hospital operator.      I have been informed and understand all of the instructions given to me and have received a copy. I have been instructed to call the  clinic [phone] or my family physician as soon as possible for continued medical care, if indicated. I do not have any more questions at this time but understand that I may call the [facility] or the Patient Navigator at [phone] during office hours should I have questions or need assistance in obtaining follow-up care.

## 2021-01-01 NOTE — Progress Notes (Signed)
START ON PATHWAY REGIMEN - Non-Small Cell Lung     A cycle is every 21 days:     Paclitaxel      Carboplatin   **Always confirm dose/schedule in your pharmacy ordering system**  Patient Characteristics: Preoperative or Nonsurgical Candidate (Clinical Staging), Stage IIIA - Surgical Candidate, Neoadjuvant Chemotherapy Preferred, Squamous Cell Therapeutic Status: Preoperative or Nonsurgical Candidate (Clinical Staging) AJCC T Category: cT2b AJCC N Category: cN2 AJCC M Category: cM0 AJCC 8 Stage Grouping: IIIA Histology: Squamous Cell Intent of Therapy: Curative Intent, Discussed with Patient

## 2021-01-07 ENCOUNTER — Inpatient Hospital Stay (HOSPITAL_COMMUNITY): Payer: Medicare PPO | Attending: Hematology

## 2021-01-07 ENCOUNTER — Encounter (HOSPITAL_COMMUNITY): Payer: Self-pay | Admitting: Hematology

## 2021-01-07 ENCOUNTER — Other Ambulatory Visit: Payer: Self-pay

## 2021-01-07 DIAGNOSIS — Z5112 Encounter for antineoplastic immunotherapy: Secondary | ICD-10-CM | POA: Insufficient documentation

## 2021-01-07 DIAGNOSIS — Z95828 Presence of other vascular implants and grafts: Secondary | ICD-10-CM

## 2021-01-07 DIAGNOSIS — Z5111 Encounter for antineoplastic chemotherapy: Secondary | ICD-10-CM | POA: Insufficient documentation

## 2021-01-07 DIAGNOSIS — Z7951 Long term (current) use of inhaled steroids: Secondary | ICD-10-CM | POA: Insufficient documentation

## 2021-01-07 DIAGNOSIS — E785 Hyperlipidemia, unspecified: Secondary | ICD-10-CM | POA: Insufficient documentation

## 2021-01-07 DIAGNOSIS — Z7982 Long term (current) use of aspirin: Secondary | ICD-10-CM | POA: Insufficient documentation

## 2021-01-07 DIAGNOSIS — F17210 Nicotine dependence, cigarettes, uncomplicated: Secondary | ICD-10-CM | POA: Insufficient documentation

## 2021-01-07 DIAGNOSIS — C3412 Malignant neoplasm of upper lobe, left bronchus or lung: Secondary | ICD-10-CM | POA: Insufficient documentation

## 2021-01-07 DIAGNOSIS — C3492 Malignant neoplasm of unspecified part of left bronchus or lung: Secondary | ICD-10-CM

## 2021-01-07 DIAGNOSIS — I10 Essential (primary) hypertension: Secondary | ICD-10-CM | POA: Insufficient documentation

## 2021-01-07 DIAGNOSIS — Z79899 Other long term (current) drug therapy: Secondary | ICD-10-CM | POA: Insufficient documentation

## 2021-01-07 NOTE — Progress Notes (Signed)

## 2021-01-11 ENCOUNTER — Ambulatory Visit (HOSPITAL_COMMUNITY): Payer: Medicare PPO | Admitting: Hematology

## 2021-01-11 ENCOUNTER — Other Ambulatory Visit: Payer: Self-pay | Admitting: Radiology

## 2021-01-12 ENCOUNTER — Encounter (HOSPITAL_COMMUNITY): Payer: Self-pay

## 2021-01-12 ENCOUNTER — Ambulatory Visit: Payer: Medicare HMO | Admitting: Pulmonary Disease

## 2021-01-12 ENCOUNTER — Ambulatory Visit (HOSPITAL_COMMUNITY)
Admission: RE | Admit: 2021-01-12 | Discharge: 2021-01-12 | Disposition: A | Discharge status: Home / Self Care | Payer: Medicare PPO | Source: Ambulatory Visit | Attending: Hematology | Admitting: Hematology

## 2021-01-12 ENCOUNTER — Other Ambulatory Visit: Payer: Self-pay

## 2021-01-12 ENCOUNTER — Telehealth: Payer: Medicare PPO | Admitting: Thoracic Surgery (Cardiothoracic Vascular Surgery)

## 2021-01-12 DIAGNOSIS — C3412 Malignant neoplasm of upper lobe, left bronchus or lung: Secondary | ICD-10-CM

## 2021-01-12 DIAGNOSIS — I10 Essential (primary) hypertension: Secondary | ICD-10-CM | POA: Diagnosis not present

## 2021-01-12 DIAGNOSIS — F17210 Nicotine dependence, cigarettes, uncomplicated: Secondary | ICD-10-CM | POA: Insufficient documentation

## 2021-01-12 DIAGNOSIS — Z8249 Family history of ischemic heart disease and other diseases of the circulatory system: Secondary | ICD-10-CM | POA: Diagnosis not present

## 2021-01-12 DIAGNOSIS — J439 Emphysema, unspecified: Secondary | ICD-10-CM | POA: Insufficient documentation

## 2021-01-12 DIAGNOSIS — Z7951 Long term (current) use of inhaled steroids: Secondary | ICD-10-CM | POA: Insufficient documentation

## 2021-01-12 DIAGNOSIS — K7010 Alcoholic hepatitis without ascites: Secondary | ICD-10-CM | POA: Diagnosis not present

## 2021-01-12 DIAGNOSIS — Z7982 Long term (current) use of aspirin: Secondary | ICD-10-CM | POA: Diagnosis not present

## 2021-01-12 DIAGNOSIS — Z452 Encounter for adjustment and management of vascular access device: Secondary | ICD-10-CM | POA: Diagnosis not present

## 2021-01-12 DIAGNOSIS — C3490 Malignant neoplasm of unspecified part of unspecified bronchus or lung: Secondary | ICD-10-CM | POA: Diagnosis not present

## 2021-01-12 DIAGNOSIS — Z79899 Other long term (current) drug therapy: Secondary | ICD-10-CM | POA: Insufficient documentation

## 2021-01-12 DIAGNOSIS — Z825 Family history of asthma and other chronic lower respiratory diseases: Secondary | ICD-10-CM | POA: Insufficient documentation

## 2021-01-12 HISTORY — PX: IR IMAGING GUIDED PORT INSERTION: IMG5740

## 2021-01-12 IMAGING — US IR IMAGING GUIDED PORT INSERTION
1 series · 1 of 1 positions shown · non-contrast
Comparison: none

INDICATION: Non-small cell lung cancer

[Series 1: ir fluoro/shunt/fist · 1 of 1 slices shown]
[im 1/1]
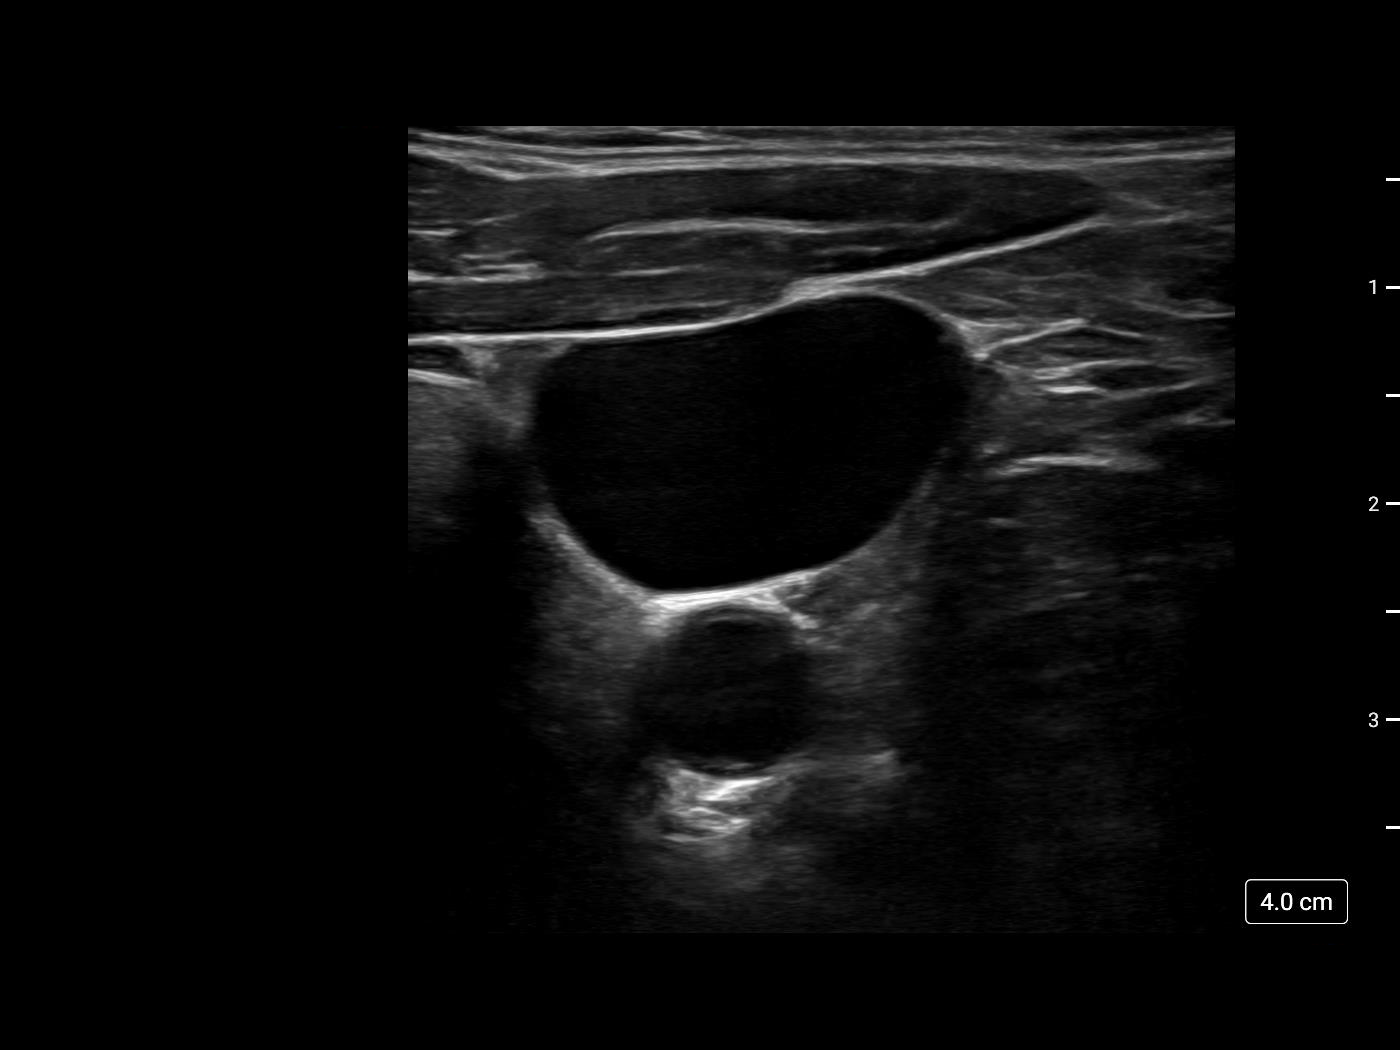

[1 of 1 positions shown; findings below may reference images not displayed]

EXAM:
IMPLANTED PORT A CATH PLACEMENT WITH ULTRASOUND AND FLUOROSCOPIC
GUIDANCE

MEDICATIONS:
None

ANESTHESIA/SEDATION:
Moderate (conscious) sedation was employed during this procedure. A
total of Versed 4 mg and Fentanyl 100 mcg was administered
intravenously.

Moderate Sedation Time: 16 minutes. The patient's level of
consciousness and vital signs were monitored continuously by
radiology nursing throughout the procedure under my direct
supervision.

FLUOROSCOPY TIME:  0 minutes, 18 seconds (2 mGy)

COMPLICATIONS:
None immediate.

PROCEDURE:
The procedure, risks, benefits, and alternatives were explained to
the patient. Questions regarding the procedure were encouraged and
answered. The patient understands and consents to the procedure.

A timeout was performed prior to the initiation of the procedure.

Patient positioned supine on the angiography table.

Right neck and anterior upper chest prepped and draped in the usual
sterile fashion. All elements of maximal sterile barrier were
utilized including, cap, mask, sterile gown, sterile gloves, large
sterile drape, hand scrubbing and 2% Chlorhexidine for skin
cleaning.

The right internal jugular vein was evaluated with ultrasound and
shown to be patent. A permanent ultrasound image was obtained and
placed in the patient's medical record. Local anesthesia was
provided with 1% lidocaine with epinephrine.

Using sterile gel and a sterile probe cover, the right internal
jugular vein was entered with a 21 ga needle during real time
ultrasound guidance.

0.018 inch guidewire placed and 21 ga needle exchanged for
transitional dilator set. Utilizing fluoroscopy, 0.035 inch
guidewire advanced through the needle without difficulty.

Attention then turned to the right anterior upper chest. Following
local lidocaine administration, a port pocket was created. The
catheter was connected to the port and brought from the pocket to
the venotomy site through a subcutaneous tunnel.

The catheter was cut to size and inserted through the peel-away
sheath. The catheter tip was positioned at the cavoatrial junction
using fluoroscopic guidance.

The port aspirated and flushed well. The port pocket was closed with
deep and superficial absorbable suture. The port pocket incision and
venotomy sites were also sealed with Dermabond.
IMPRESSION: Successful placement of a right internal jugular approach power
injectable Port-A-Cath. The catheter is ready for immediate use.

## 2021-01-12 MED ORDER — HEPARIN SOD (PORK) LOCK FLUSH 100 UNIT/ML IV SOLN
INTRAVENOUS | Status: AC | PRN
  Administered 2021-01-12: 500 [IU] via INTRAVENOUS

## 2021-01-12 MED ORDER — FENTANYL CITRATE (PF) 100 MCG/2ML IJ SOLN
INTRAMUSCULAR | Status: AC | PRN
  Administered 2021-01-12 (×2): 50 ug via INTRAVENOUS

## 2021-01-12 MED ORDER — FENTANYL CITRATE (PF) 100 MCG/2ML IJ SOLN
INTRAMUSCULAR | Status: AC
  Filled 2021-01-12: qty 2

## 2021-01-12 MED ORDER — MIDAZOLAM HCL 2 MG/2ML IJ SOLN
INTRAMUSCULAR | Status: AC | PRN
  Administered 2021-01-12 (×2): 1 mg via INTRAVENOUS
  Administered 2021-01-12: 2 mg via INTRAVENOUS

## 2021-01-12 MED ORDER — MIDAZOLAM HCL 2 MG/2ML IJ SOLN
INTRAMUSCULAR | Status: AC
  Filled 2021-01-12: qty 4

## 2021-01-12 MED ORDER — HEPARIN SOD (PORK) LOCK FLUSH 100 UNIT/ML IV SOLN
INTRAVENOUS | Status: AC
  Filled 2021-01-12: qty 5

## 2021-01-12 MED ORDER — LIDOCAINE-EPINEPHRINE 1 %-1:100000 IJ SOLN
INTRAMUSCULAR | Status: AC
  Filled 2021-01-12: qty 1

## 2021-01-12 MED ORDER — SODIUM CHLORIDE 0.9 % IV SOLN: INTRAVENOUS | Status: DC

## 2021-01-12 MED ORDER — LIDOCAINE-EPINEPHRINE 1 %-1:100000 IJ SOLN
INTRAMUSCULAR | Status: AC | PRN
  Administered 2021-01-12: 20 mL

## 2021-01-12 NOTE — Discharge Instructions (Signed)
Interventional radiology phone numbers [phone] After hours [phone]    You have skin glue (dermabond) over your new port. Do not use the lidocaine cream (EMLA cream) over the skin glue until it has healed. The petroleum in the lidocaine cream will dissolve the skin glue resulting in an infection of your new port. Use ice in a zip lock bag for 1-2 minutes over your new port before the cancer center nurses access your port.   Implanted Port Insertion, Care After This sheet gives you information about how to care for yourself after your procedure. Your health care provider may also give you more specific instructions. If you have problems or questions, contact your health care provider. What can I expect after the procedure? After the procedure, it is common to have:  Discomfort at the port insertion site.  Bruising on the skin over the port. This should improve over 3-4 days. Follow these instructions at home: [facility] care  After your port is placed, you will get a manufacturer's information card. The card has information about your port. Keep this card with you at all times.  Take care of the port as told by your health care provider. Ask your health care provider if you or a family member can get training for taking care of the port at home. A home health care nurse may also take care of the port.  Make sure to remember what type of port you have. Incision care 1. Follow instructions from your health care provider about how to take care of your port insertion site. Make sure you: ? Wash your hands with soap and water before and after you change your bandage (dressing). If soap and water are not available, use hand sanitizer. ? Change your dressing as told by your health care provider. 2. Leave skin glue in place. These skin closures may need to stay in place for 2 weeks or longer.  3. Check your port insertion site every day for signs of infection. Check for: ? Redness, swelling,  or pain. ? Fluid or blood. ? Warmth. ? Pus or a bad smell.      Activity  Return to your normal activities as told by your health care provider. Ask your health care provider what activities are safe for you.  Do not lift anything that is heavier than 10 lb (4.5 kg), or the limit that you are told, until your health care provider says that it is safe. General instructions  Take over-the-counter and prescription medicines only as told by your health care provider.  Do not take baths, swim, or use a hot tub until your health care provider approves.You may remove your dressing tomorrow and shower 24 hours after your procedure.  Do not drive for 24 hours if you were given a sedative during your procedure.  Wear a medical alert bracelet in case of an emergency. This will tell any health care providers that you have a port.  Keep all follow-up visits as told by your health care provider. This is important. Contact a health care provider if:  You cannot flush your port with saline as directed, or you cannot draw blood from the port.  You have a fever or chills.  You have redness, swelling, or pain around your port insertion site.  You have fluid or blood coming from your port insertion site.  Your port insertion site feels warm to the touch.  You have pus or a bad smell coming from the port insertion site.  Get help right away if:  You have chest pain or shortness of breath.  You have bleeding from your port that you cannot control. Summary  Take care of the port as told by your health care provider. Keep the manufacturer's information card with you at all times.  Change your dressing as told by your health care provider.  Contact a health care provider if you have a fever or chills or if you have redness, swelling, or pain around your port insertion site.  Keep all follow-up visits as told by your health care provider. This information is not intended to replace advice given  to you by your health care provider. Make sure you discuss any questions you have with your health care provider. Document Revised: 02/20/2018 Document Reviewed: 02/20/2018 Elsevier Patient Education  2021 [place].    Moderate Conscious Sedation, Adult, Care After This sheet gives you information about how to care for yourself after your procedure. Your health care provider may also give you more specific instructions. If you have problems or questions, contact your health care provider. What can I expect after the procedure? After the procedure, it is common to have:  Sleepiness for several hours.  Impaired judgment for several hours.  Difficulty with balance.  Vomiting if you eat too soon. Follow these instructions at home: For the time period you were told by your health care provider:  Rest.  Do not participate in activities where you could fall or become injured.  Do not drive or use machinery.  Do not drink alcohol.  Do not take sleeping pills or medicines that cause drowsiness.  Do not make important decisions or sign legal documents.  Do not take care of children on your own.      Eating and drinking 4. Follow the diet recommended by your health care provider. 5. Drink enough fluid to keep your urine pale yellow. 6. If you vomit: ? Drink water, juice, or soup when you can drink without vomiting. ? Make sure you have little or no nausea before eating solid foods.   General instructions  Take over-the-counter and prescription medicines only as told by your health care provider.  Have a responsible adult stay with you for the time you are told. It is important to have someone help care for you until you are awake and alert.  Do not smoke.  Keep all follow-up visits as told by your health care provider. This is important. Contact a health care provider if:  You are still sleepy or having trouble with balance after 24 hours.  You feel  light-headed.  You keep feeling nauseous or you keep vomiting.  You develop a rash.  You have a fever.  You have redness or swelling around the IV site. Get help right away if:  You have trouble breathing.  You have new-onset confusion at home. Summary  After the procedure, it is common to feel sleepy, have impaired judgment, or feel nauseous if you eat too soon.  Rest after you get home. Know the things you should not do after the procedure.  Follow the diet recommended by your health care provider and drink enough fluid to keep your urine pale yellow.  Get help right away if you have trouble breathing or new-onset confusion at home. This information is not intended to replace advice given to you by your health care provider. Make sure you discuss any questions you have with your health care provider. Document Revised: 11/22/2019 Document Reviewed: 06/20/2019  Elsevier Patient Education  2021 [employer].

## 2021-01-12 NOTE — H&P (Signed)
Chief Complaint: Patient was seen in consultation today for image guided port-a-cath placement at the request of Katragadda,Sreedhar  Referring Physician(s): Derek Jack  Supervising Physician: Mir, Sharen Heck  Patient Status: [facility] - Out-pt  History of Present Illness: Bryan Fleming is a 60 y.o. male PMH of hypertension, hyperlipidemia, GERD, COPD, hepatitis, and left upper lobe lung cancer biopsy-proven on 12/23/2020 who was referred to IR for a image guided Port-A-Cath placement.  Patient has been followed by pulmonology due to history of COPD and was referred to hematology/oncology in April 2022 for evaluation of cavitating mass in left upper lobe of lung.  Patient underwent bronchoscopy with biopsy with Dr. 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  Patient had a follow-up visit with Dr. Delton Coombes on 12/29/2020 when a chemotherapy was recommended to the patient for the treatment of the lung cancer.  After thorough discussion and shared decision making, patient decided to proceed with the chemotherapy.  IR was requested for image guided Port-A-Cath placement.  Patient laying in bed, not in acute distress.  Reports chronic cough and shortness of breath due to COPD. Denise headache, fever, chills, chest pain, abdominal pain, nausea ,vomiting, and bleeding.   Past Medical History:  Diagnosis Date  . COPD (chronic obstructive pulmonary disease) ([place]) 2007   EMPHYSEMA  . Depression   . Dyspnea    occasional - no oxygen  . GERD (gastroesophageal reflux disease) 2010  . Hepatitis    Alcoholic   . HLD (hyperlipidemia)    no meds  . Hypertension   . Port-A-Cath in place 01/01/2021    Past Surgical History:  Procedure Laterality Date  . BRONCHIAL BIOPSY  12/23/2020   Procedure: BRONCHIAL BIOPSIES;  Surgeon: [name], MD;  Location: [facility] ENDOSCOPY;  Service: Pulmonary;;  . BRONCHIAL BRUSHINGS  12/23/2020    Procedure: BRONCHIAL BRUSHINGS;  Surgeon: [name], MD;  Location: [facility] ENDOSCOPY;  Service: Pulmonary;;  . BRONCHIAL NEEDLE ASPIRATION BIOPSY  12/23/2020   Procedure: BRONCHIAL NEEDLE ASPIRATION BIOPSIES;  Surgeon: [name], MD;  Location: [facility] ENDOSCOPY;  Service: Pulmonary;;  . BRONCHIAL WASHINGS  12/23/2020   Procedure: BRONCHIAL WASHINGS;  Surgeon: [name], MD;  Location: [facility] ENDOSCOPY;  Service: Pulmonary;;  . CARDIAC CATHETERIZATION  2010  . COLONOSCOPY  03/14/2012   Procedure: COLONOSCOPY;  Surgeon: Rogene Houston, MD;  Location: [facility];  Service: Endoscopy;  Laterality: N/A;  100  . VIDEO BRONCHOSCOPY WITH ENDOBRONCHIAL NAVIGATION N/A 12/23/2020   Procedure: VIDEO BRONCHOSCOPY WITH ENDOBRONCHIAL NAVIGATION;  Surgeon: [name], MD;  Location: [facility] ENDOSCOPY;  Service: Pulmonary;  Laterality: N/A;  . WRIST GANGLION EXCISION  1985    Allergies: Other  Medications: Prior to Admission medications   Medication Sig Start Date End Date Taking? Authorizing Provider  amLODipine (NORVASC) 5 MG tablet Take 5 mg by mouth daily. 04/16/20  Yes [provider]  aspirin 81 MG tablet Take 81 mg by mouth daily.   Yes [provider]  metoprolol tartrate (LOPRESSOR) 25 MG tablet Take 25 mg by mouth 2 (two) times daily.   Yes [provider]  Multiple Vitamin (MULTIVITAMIN WITH MINERALS) TABS tablet Take 1 tablet by mouth daily. 06/07/20  Yes Johnson, Clanford L, MD  PROVENTIL HFA 108 (90 Base) MCG/ACT inhaler INHALE 2 PUFFS BY MOUTH EVERY 6 HOURS AS NEEDED FOR COUGHING, WHEEZING, OR SHORTNESS OF BREATH Patient taking differently: Inhale 2 puffs into the lungs every 6 (six) hours as  needed for wheezing or shortness of breath (or coughing). 11/13/17  Yes Soyla Dryer, PA-C  SYMBICORT 160-4.5 MCG/ACT inhaler Inhale 2 puffs into the lungs in the morning and at bedtime. 04/29/20  Yes [provider]  CARBOPLATIN IV Inject into the vein every 21 (  twenty-one) days. 01/13/21   [provider]  lidocaine-prilocaine (EMLA) cream Apply small amount to port a cath site and cover with plastic wrap 1 hour prior to infusion appointments 01/01/21   Derek Jack, MD  NIVOLUMAB IV Inject 360 mg into the vein every 21 ( twenty-one) days. 01/13/21   [provider]  PACLITAXEL IV Inject 200 mg/m2 into the vein every 21 ( twenty-one) days. 01/13/21   [provider]  prochlorperazine (COMPAZINE) 10 MG tablet Take 1 tablet (10 mg total) by mouth every 6 (six) hours as needed (Nausea or vomiting). 01/01/21   Derek Jack, MD  sildenafil (VIAGRA) 50 MG tablet Take 50 mg by mouth daily as needed. 12/17/20   [provider]     Family History  Problem Relation Age of Onset  . Hypertension Mother   . Asthma Father   . Diabetes Sister   . Hypertension Sister   . Hypertension Brother   . Hypertension Sister   . Lupus Daughter     Social History   Socioeconomic History  . Marital status: Widowed    Spouse name: Not on file  . Number of children: Not on file  . Years of education: Not on file  . Highest education level: Not on file  Occupational History  . Not on file  Tobacco Use  . Smoking status: Current Every Day Smoker    Packs/day: 1.00    Years: 37.00    Pack years: 37.00    Types: Cigarettes  . Smokeless tobacco: Never Used  . Tobacco comment: smokes 1-1.5 per day 12/22/20  Vaping Use  . Vaping Use: Never used  Substance and Sexual Activity  . Alcohol use: Yes    Alcohol/week: 22.0 standard drinks    Types: 21 Cans of beer, 1 Standard drinks or equivalent per week    Comment: daily  . Drug use: No  . Sexual activity: Yes  Other Topics Concern  . Not on file  Social History Narrative  . Not on file   Social Determinants of Health   Financial Resource Strain: Not on file  Food Insecurity: Not on file  Transportation Needs: No Transportation Needs  . Lack of Transportation  (Medical): No  . Lack of Transportation (Non-Medical): No  Physical Activity: Inactive  . Days of Exercise per Week: 0 days  . Minutes of Exercise per Session: 0 min  Stress: Not on file  Social Connections: Not on file     Review of Systems: A 12 point ROS discussed and pertinent positives are indicated in the HPI above.  All other systems are negative.   Vital Signs: BP 126/78   Pulse 87   Temp 98.8 F (37.1 C) (Oral)   Resp 17   SpO2 98%   Physical Exam Vitals reviewed.  Constitutional:      General: He is not in acute distress.    Appearance: He is not ill-appearing.  HENT:     Head: Normocephalic and atraumatic.     Mouth/Throat:     Mouth: Mucous membranes are moist.  Cardiovascular:     Rate and Rhythm: Normal rate and regular rhythm.     Pulses: Normal pulses.  Heart sounds: Normal heart sounds.  Pulmonary:     Effort: Pulmonary effort is normal.     Breath sounds: Wheezing present.     Comments: Expiratory wheezing on right upper, left upper and left lower lobe.  Abdominal:     General: Abdomen is flat.     Palpations: Abdomen is soft.  Musculoskeletal:     Cervical back: Neck supple.  Skin:    General: Skin is warm and dry.     Coloration: Skin is not jaundiced or pale.  Neurological:     Mental Status: He is alert and oriented to person, place, and time.  Psychiatric:        Mood and Affect: Mood normal.        Behavior: Behavior normal.        Judgment: Judgment normal.     MD Evaluation Airway: WNL Heart: WNL Abdomen: WNL Chest/ Lungs: WNL ASA  Classification: 3 Mallampati/Airway Score: Two  Imaging: MR Brain W Wo Contrast  Result Date: 12/17/2020 CLINICAL DATA:  60 year old male recently diagnosed with lung cancer. Staging. EXAM: MRI HEAD WITHOUT AND WITH CONTRAST TECHNIQUE: Multiplanar, multiecho pulse sequences of the brain and surrounding structures were obtained without and with intravenous contrast. CONTRAST:  37mL GADAVIST  GADOBUTROL 1 MMOL/ML IV SOLN COMPARISON:  PET-CT 12/14/2020 FINDINGS: Brain: No midline shift, mass effect, or evidence of intracranial mass lesion. No abnormal enhancement identified. No dural thickening. No restricted diffusion to suggest acute infarction. No ventriculomegaly, extra-axial collection or acute intracranial hemorrhage. Cervicomedullary junction and pituitary are within normal limits. Scattered and occasionally patchy nonspecific cerebral white matter T2 and FLAIR hyperintensity, with similar signal heterogeneity in the pons. Deep gray matter nuclei and cerebellum appear negative. No cortical encephalomalacia or chronic cerebral blood products identified. Vascular: Major intracranial vascular flow voids are preserved. Major dural venous sinuses are enhancing and appear to be patent. Skull and upper cervical spine: Bone marrow signal in the calvarium is mildly heterogeneous but benign, with no destructive or suspicious osseous lesion identified. Negative visible cervical spine, spinal cord. Sinuses/Orbits: Negative orbits. Bilateral maxillary sinus mucous retention cysts. Other: Visible internal auditory structures appear normal. Negative visible scalp and face soft tissues. IMPRESSION: 1. No metastatic disease or acute intracranial abnormality. 2. Mild to moderate for age nonspecific signal changes in the cerebral white matter and pons, most commonly due to chronic small vessel disease. Electronically Signed   By: Genevie Ann M.D.   On: 12/17/2020 05:39   NM PET Image Initial (PI) Skull Base To Thigh  Result Date: 12/15/2020 CLINICAL DATA:  Initial treatment strategy for left upper lobe cavitary lung mass on lung cancer screening CT. Borderline AP window adenopathy. EXAM: NUCLEAR MEDICINE PET SKULL BASE TO THIGH TECHNIQUE: 9.6 mCi F-18 FDG was injected intravenously. Full-ring PET imaging was performed from the skull base to thigh after the radiotracer. CT data was obtained and used for attenuation  correction and anatomic localization. Fasting blood glucose: 90 mg/dl COMPARISON:  Chest CT of 10/06/2020.  Abdominopelvic CT 06/04/2020 FINDINGS: Mediastinal blood pool activity: SUV max 2.1 Liver activity: SUV max NA NECK: No areas of abnormal hypermetabolism. Incidental CT findings: Bilateral maxillary sinus mucous retention cysts or polyps. Bilateral carotid atherosclerosis. CHEST: Hypermetabolism corresponding to the previously described cavitary left upper lobe lung mass. Example 3.9 x 4.0 cm and a S.U.V. max of 17.1 on 96/3. Contiguous hypermetabolism along the left chest wall, including involving the anterolateral left third rib at a S.U.V. max of 5.4 on 98/3. No  gross osseous destruction. The AP window/prevascular node of 9 mm is mildly hypermetabolic including at a S.U.V. max of 2.8 on 101/3. Incidental CT findings: Aortic and coronary artery calcification. Moderate centrilobular and mild paraseptal emphysema. ABDOMEN/PELVIS: No abdominopelvic parenchymal or nodal hypermetabolism. Incidental CT findings: Normal adrenal glands. Mild hepatic steatosis. Mild prostatomegaly. SKELETON: No other abnormal marrow activity. Incidental CT findings: Remote lower right rib fractures. IMPRESSION: 1. Hypermetabolic left upper lobe lung mass, consistent with primary bronchogenic carcinoma. Contiguous nonspecific hypermetabolism along the adjacent anterior left chest wall, without gross soft tissue mass or osseous involvement. 2. Prevascular/AP window node with mild hypermetabolism, suspicious for nodal metastasis. 3. No evidence of extrathoracic hypermetabolic metastatic disease. 4. Incidental findings, including: Aortic atherosclerosis (ICD10-I70.0), coronary artery atherosclerosis and emphysema (ICD10-J43.9). Hepatic steatosis. Electronically Signed   By: Abigail Miyamoto M.D.   On: 12/15/2020 12:45   DG Chest Port 1 View  Result Date: 12/23/2020 CLINICAL DATA:  Status post left upper lobe bronchoscopy. EXAM: PORTABLE  CHEST 1 VIEW COMPARISON:  12/21/2020 FINDINGS: The heart size and mediastinal contours are within normal limits. Chronic interstitial changes of emphysema identified. The left upper lobe lung mass is again noted. This measures 5.1 cm. No pneumothorax after bronchoscopy and biopsy. Remote right lower posterior rib fracture. The visualized skeletal structures are unremarkable. IMPRESSION: 1. No pneumothorax status post bronchoscopy and biopsy. 2. Left upper lobe lung mass. Electronically Signed   By: Kerby Moors M.D.   On: 12/23/2020 11:58   CT Super D Chest Wo Contrast  Result Date: 12/22/2020 CLINICAL DATA:  60 year old male with history of cavitating left upper lobe mass presenting with productive cough for 1 year. EXAM: CT CHEST WITHOUT CONTRAST TECHNIQUE: Multidetector CT imaging of the chest was performed using thin slice collimation for electromagnetic bronchoscopy planning purposes, without intravenous contrast. COMPARISON:  Chest CT 10/16/2020.  PET-CT 12/14/2020. FINDINGS: Cardiovascular: Heart size is normal. There is no significant pericardial fluid, thickening or pericardial calcification. There is aortic atherosclerosis, as well as atherosclerosis of the great vessels of the mediastinum and the coronary arteries, including calcified atherosclerotic plaque in the left main, left anterior descending, left circumflex and right coronary arteries. Calcifications of the aortic valve. Mediastinum/Nodes: Prominent enlarging but technically nonenlarged AP window lymph node measuring 9 mm in short axis (axial image 61 of series 2), concerning for metastatic lymph node. No other definite mediastinal or hilar lymphadenopathy confidently identified on today's noncontrast CT examination. Esophagus is unremarkable in appearance. No axillary lymphadenopathy. Lungs/Pleura: Enlarging centrally cavitary thick-walled left upper lobe mass which currently measures 4.3 x 6.8 x 4.6 cm (axial image 50 of series 3 and  coronal image 32 of series 5). This mass once again has macrolobulated margins with spiculated borders with extension to the overlying pleura antral laterally which appears retracted. Small air-fluid level within the lesion on today's study may suggest superinfection of the lesion. Some ground-glass attenuation and septal thickening in the adjacent lung parenchyma is compatible with local infiltration of tumor and/or mild postobstructive changes. No other definite suspicious appearing pulmonary nodules or masses are noted. No acute consolidative airspace disease. No pleural effusions. Diffuse bronchial wall thickening with moderate centrilobular and paraseptal emphysema. Upper Abdomen: Aortic atherosclerosis. Mild diffuse low attenuation throughout the visualized hepatic parenchyma, indicative of hepatic steatosis. Musculoskeletal: There are no aggressive appearing lytic or blastic lesions noted in the visualized portions of the skeleton. IMPRESSION: 1. Enlarging thick-walled cavitary mass in the left upper lobe which currently measures 4.3 x 6.8 x 4.6 cm with  probable metastatic lymphadenopathy in the AP window, as above. 2. Diffuse bronchial wall thickening with moderate centrilobular and paraseptal emphysema; imaging findings suggestive of underlying COPD. 3. Aortic atherosclerosis, in addition to left main and 3 vessel coronary artery disease. Please note that although the presence of coronary artery calcium documents the presence of coronary artery disease, the severity of this disease and any potential stenosis cannot be assessed on this non-gated CT examination. Assessment for potential risk factor modification, dietary therapy or pharmacologic therapy may be warranted, if clinically indicated. 4. There are calcifications of the aortic valve. Echocardiographic correlation for evaluation of potential valvular dysfunction may be warranted if clinically indicated. 5. Hepatic steatosis. Aortic Atherosclerosis  (ICD10-I70.0) and Emphysema (ICD10-J43.9). Electronically Signed   By: Vinnie Langton M.D.   On: 12/22/2020 10:40   DG C-ARM BRONCHOSCOPY  Result Date: 12/23/2020 C-ARM BRONCHOSCOPY: Fluoroscopy was utilized by the requesting physician.  No radiographic interpretation.    Labs:  CBC: Recent Labs    06/04/20 0920 06/05/20 0548 12/18/20 1054  WBC 10.9* 7.4 7.8  HGB 16.3 13.6 11.4*  HCT 48.2 42.2 36.0*  PLT 232 171 264    COAGS: Recent Labs    06/05/20 0548 12/18/20 1054  INR 1.1 1.1    BMP: Recent Labs    06/04/20 0920 06/05/20 0548 12/18/20 1054  NA 135 133* 136  K 3.1* 4.4 4.1  CL 88* 99 103  CO2 30 26 23   GLUCOSE 203* 106* 114*  BUN 5* 5* 8  CALCIUM 9.4 8.8* 9.3  CREATININE 0.73 0.61 0.61  GFRNONAA >60 >60 >60    LIVER FUNCTION TESTS: Recent Labs    06/04/20 0920 06/05/20 0548 12/18/20 1054  BILITOT 1.1 1.1 0.5  AST 69* 28 91*  ALT 32 20 42  ALKPHOS 88 63 80  PROT 8.6* 6.7 8.6*  ALBUMIN 4.4 3.4* 4.1    TUMOR MARKERS: No results for input(s): AFPTM, CEA, CA199, CHROMGRNA in the last 8760 hours.  Assessment and Plan: 60 y.o. male with recent diagnosis of left upper lobe lung cancer.  Patient had a follow-up visit with oncologist, and chemotherapy was recommended to the patient for the lung cancer treatment.  After thorough discussion and shared decision making, patient decided to proceed with the chemotherapy.  IR was requested for image guided Port-A-Cath placement. Patient presents to IR today for the procedure. N.p.o. since midnight VSS  Risks and benefits of image guided port-a-catheter placement was discussed with the patient including, but not limited to bleeding, infection, pneumothorax, or fibrin sheath development and need for additional procedures.  All of the patient's questions were answered, patient is agreeable to proceed. Consent signed and in chart.    Thank you for this interesting consult.  I greatly enjoyed meeting  Bryan Fleming and look forward to participating in their care.  A copy of this report was sent to the requesting provider on this date.  Electronically Signed: Tera Mater, PA-C 01/12/2021, 1:03 PM   I spent a total of  15 Minutes   in face to face in clinical consultation, greater than 50% of which was counseling/coordinating care for image guided Port-A-Cath placement.

## 2021-01-12 NOTE — Procedures (Signed)
Interventional Radiology Procedure Note  Procedure: Chest port  Indication: Lung ca  Findings: Please refer to procedural dictation for full description.  Complications: None  EBL: < 10 mL  Miachel Roux, MD [phone]

## 2021-01-13 ENCOUNTER — Other Ambulatory Visit (HOSPITAL_COMMUNITY): Payer: Self-pay | Admitting: Hematology and Oncology

## 2021-01-13 ENCOUNTER — Inpatient Hospital Stay (HOSPITAL_COMMUNITY): Payer: Medicare PPO

## 2021-01-13 VITALS — BP 172/83 | HR 88 | Temp 98.1°F | Resp 16 | Wt 165.8 lb

## 2021-01-13 DIAGNOSIS — Z7951 Long term (current) use of inhaled steroids: Secondary | ICD-10-CM | POA: Diagnosis not present

## 2021-01-13 DIAGNOSIS — C3492 Malignant neoplasm of unspecified part of left bronchus or lung: Secondary | ICD-10-CM

## 2021-01-13 DIAGNOSIS — Z5111 Encounter for antineoplastic chemotherapy: Secondary | ICD-10-CM | POA: Diagnosis not present

## 2021-01-13 DIAGNOSIS — Z5112 Encounter for antineoplastic immunotherapy: Secondary | ICD-10-CM | POA: Diagnosis not present

## 2021-01-13 DIAGNOSIS — Z95828 Presence of other vascular implants and grafts: Secondary | ICD-10-CM

## 2021-01-13 DIAGNOSIS — Z7982 Long term (current) use of aspirin: Secondary | ICD-10-CM | POA: Diagnosis not present

## 2021-01-13 DIAGNOSIS — C3412 Malignant neoplasm of upper lobe, left bronchus or lung: Secondary | ICD-10-CM | POA: Diagnosis not present

## 2021-01-13 DIAGNOSIS — Z79899 Other long term (current) drug therapy: Secondary | ICD-10-CM | POA: Diagnosis not present

## 2021-01-13 DIAGNOSIS — I10 Essential (primary) hypertension: Secondary | ICD-10-CM | POA: Diagnosis not present

## 2021-01-13 DIAGNOSIS — E785 Hyperlipidemia, unspecified: Secondary | ICD-10-CM | POA: Diagnosis not present

## 2021-01-13 DIAGNOSIS — F17210 Nicotine dependence, cigarettes, uncomplicated: Secondary | ICD-10-CM | POA: Diagnosis not present

## 2021-01-13 LAB — CBC WITH DIFFERENTIAL/PLATELET
Abs Immature Granulocytes: 0.03 10*3/uL (ref 0.00–0.07)
Basophils Absolute: 0.1 10*3/uL (ref 0.0–0.1)
Basophils Relative: 1 %
Eosinophils Absolute: 0.1 10*3/uL (ref 0.0–0.5)
Eosinophils Relative: 1 %
HCT: 34.8 % — ABNORMAL LOW (ref 39.0–52.0)
Hemoglobin: 11.1 g/dL — ABNORMAL LOW (ref 13.0–17.0)
Immature Granulocytes: 0 %
Lymphocytes Relative: 35 %
Lymphs Abs: 2.9 10*3/uL (ref 0.7–4.0)
MCH: 28.2 pg (ref 26.0–34.0)
MCHC: 31.9 g/dL (ref 30.0–36.0)
MCV: 88.5 fL (ref 80.0–100.0)
Monocytes Absolute: 0.9 10*3/uL (ref 0.1–1.0)
Monocytes Relative: 10 %
Neutro Abs: 4.4 10*3/uL (ref 1.7–7.7)
Neutrophils Relative %: 53 %
Platelets: 231 10*3/uL (ref 150–400)
RBC: 3.93 MIL/uL — ABNORMAL LOW (ref 4.22–5.81)
RDW: 16.3 % — ABNORMAL HIGH (ref 11.5–15.5)
WBC: 8.3 10*3/uL (ref 4.0–10.5)
nRBC: 0 % (ref 0.0–0.2)

## 2021-01-13 LAB — COMPREHENSIVE METABOLIC PANEL
ALT: 33 U/L (ref 0–44)
AST: 56 U/L — ABNORMAL HIGH (ref 15–41)
Albumin: 3.7 g/dL (ref 3.5–5.0)
Alkaline Phosphatase: 72 U/L (ref 38–126)
Anion gap: 9 (ref 5–15)
BUN: 10 mg/dL (ref 6–20)
CO2: 23 mmol/L (ref 22–32)
Calcium: 8.9 mg/dL (ref 8.9–10.3)
Chloride: 107 mmol/L (ref 98–111)
Creatinine, Ser: 0.59 mg/dL — ABNORMAL LOW (ref 0.61–1.24)
GFR, Estimated: >60 mL/min (ref >60)
Glucose, Bld: 118 mg/dL — ABNORMAL HIGH (ref 70–99)
Potassium: 3.4 mmol/L — ABNORMAL LOW (ref 3.5–5.1)
Sodium: 139 mmol/L (ref 135–145)
Total Bilirubin: 0.5 mg/dL (ref 0.3–1.2)
Total Protein: 7.7 g/dL (ref 6.5–8.1)

## 2021-01-13 LAB — MAGNESIUM: Magnesium: 2.2 mg/dL (ref 1.7–2.4)

## 2021-01-13 LAB — TSH: TSH: 1.907 u[IU]/mL (ref 0.350–4.500)

## 2021-01-13 MED ORDER — SODIUM CHLORIDE 0.9 % IV SOLN
150.0000 mg | Freq: Once | INTRAVENOUS | Status: AC
  Administered 2021-01-13: 150 mg via INTRAVENOUS
  Filled 2021-01-13: qty 150

## 2021-01-13 MED ORDER — SODIUM CHLORIDE 0.9 % IV SOLN: Freq: Once | INTRAVENOUS | Status: AC

## 2021-01-13 MED ORDER — SODIUM CHLORIDE 0.9 % IV SOLN
360.0000 mg | Freq: Once | INTRAVENOUS | Status: AC
  Administered 2021-01-13: 360 mg via INTRAVENOUS
  Filled 2021-01-13: qty 12

## 2021-01-13 MED ORDER — SODIUM CHLORIDE 0.9 % IV SOLN
10.0000 mg | Freq: Once | INTRAVENOUS | Status: AC
  Administered 2021-01-13: 10 mg via INTRAVENOUS
  Filled 2021-01-13: qty 10

## 2021-01-13 MED ORDER — SODIUM CHLORIDE 0.9 % IV SOLN
766.8000 mg | Freq: Once | INTRAVENOUS | Status: AC
  Administered 2021-01-13: 770 mg via INTRAVENOUS
  Filled 2021-01-13: qty 77

## 2021-01-13 MED ORDER — HEPARIN SOD (PORK) LOCK FLUSH 100 UNIT/ML IV SOLN
500.0000 [IU] | Freq: Once | INTRAVENOUS | Status: AC | PRN
  Administered 2021-01-13: 500 [IU]

## 2021-01-13 MED ORDER — DIPHENHYDRAMINE HCL 50 MG/ML IJ SOLN
50.0000 mg | Freq: Once | INTRAMUSCULAR | Status: AC
  Administered 2021-01-13: 50 mg via INTRAVENOUS
  Filled 2021-01-13: qty 1

## 2021-01-13 MED ORDER — SODIUM CHLORIDE 0.9 % IV SOLN
200.0000 mg/m2 | Freq: Once | INTRAVENOUS | Status: AC
  Administered 2021-01-13: 372 mg via INTRAVENOUS
  Filled 2021-01-13: qty 62

## 2021-01-13 MED ORDER — FAMOTIDINE 20 MG IN NS 100 ML IVPB
20.0000 mg | Freq: Once | INTRAVENOUS | Status: AC
  Administered 2021-01-13: 20 mg via INTRAVENOUS
  Filled 2021-01-13: qty 2

## 2021-01-13 MED ORDER — SODIUM CHLORIDE 0.9% FLUSH
10.0000 mL | INTRAVENOUS | Status: DC | PRN
  Administered 2021-01-13: 10 mL

## 2021-01-13 MED ORDER — PALONOSETRON HCL INJECTION 0.25 MG/5ML
0.2500 mg | Freq: Once | INTRAVENOUS | Status: AC
Start: 2021-01-13 — End: 2021-01-13
  Administered 2021-01-13: 0.25 mg via INTRAVENOUS
  Filled 2021-01-13: qty 5

## 2021-01-13 NOTE — Progress Notes (Signed)
Labs reviewed with MD today. Dr. Alvy Bimler. Will proceed as planned.  Treatment given per orders. Patient tolerated it well without problems. Vitals stable and discharged home from clinic via wheelchair. Follow up as scheduled.

## 2021-01-13 NOTE — Patient Instructions (Signed)
[facility]  Discharge Instructions: Thank you for choosing [facility] to provide your oncology and hematology care.  If you have a lab appointment with the [facility], please come in thru the Main Entrance and check in at the main information desk.  Wear comfortable clothing and clothing appropriate for easy access to any Portacath or PICC line.   We strive to give you quality time with your provider. You may need to reschedule your appointment if you arrive late (15 or more minutes).  Arriving late affects you and other patients whose appointments are after yours.  Also, if you miss three or more appointments without notifying the office, you may be dismissed from the clinic at the provider's discretion.      For prescription refill requests, have your pharmacy contact our office and allow 72 hours for refills to be completed.    Today you received the following chemotherapy and/or immunotherapy agents    To help prevent nausea and vomiting after your treatment, we encourage you to take your nausea medication as directed.  BELOW ARE SYMPTOMS THAT SHOULD BE REPORTED IMMEDIATELY: . *FEVER GREATER THAN 100.4 F (38 C) OR HIGHER . *CHILLS OR SWEATING . *NAUSEA AND VOMITING THAT IS NOT CONTROLLED WITH YOUR NAUSEA MEDICATION . *UNUSUAL SHORTNESS OF BREATH . *UNUSUAL BRUISING OR BLEEDING . *URINARY PROBLEMS (pain or burning when urinating, or frequent urination) . *BOWEL PROBLEMS (unusual diarrhea, constipation, pain near the anus) . TENDERNESS IN MOUTH AND THROAT WITH OR WITHOUT PRESENCE OF ULCERS (sore throat, sores in mouth, or a toothache) . UNUSUAL RASH, SWELLING OR PAIN  . UNUSUAL VAGINAL DISCHARGE OR ITCHING   Items with * indicate a potential emergency and should be followed up as soon as possible or go to the Emergency Department if any problems should occur.  Please show the CHEMOTHERAPY ALERT CARD or IMMUNOTHERAPY ALERT CARD at check-in to the  Emergency Department and triage nurse.  Should you have questions after your visit or need to cancel or reschedule your appointment, please contact [facility] [phone]  and follow the prompts.  Office hours are 8:00 a.m. to 4:30 p.m. Monday - Friday. Please note that voicemails left after 4:00 p.m. may not be returned until the following business day.  We are closed weekends and major holidays. You have access to a nurse at all times for urgent questions. Please call the main number to the clinic [phone] and follow the prompts.  For any non-urgent questions, you may also contact your provider using MyChart. We now offer e-Visits for anyone 48 and older to request care online for non-urgent symptoms. For details visit [email or web address].   Also download the MyChart app! Go to the app store, search "MyChart", open the app, select [facility], and log in with your MyChart username and password.  Due to Covid, a mask is required upon entering the hospital/clinic. If you do not have a mask, one will be given to you upon arrival. For doctor visits, patients may have 1 support person aged 16 or older with them. For treatment visits, patients cannot have anyone with them due to current Covid guidelines and our immunocompromised population.

## 2021-01-14 ENCOUNTER — Ambulatory Visit: Payer: Medicare PPO | Admitting: Pulmonary Disease

## 2021-01-14 ENCOUNTER — Telehealth (HOSPITAL_COMMUNITY): Payer: Self-pay

## 2021-01-14 NOTE — Telephone Encounter (Signed)
24 hour follow up call- no complaints today. Pt feels good today. No issues at this time.

## 2021-01-15 ENCOUNTER — Ambulatory Visit (HOSPITAL_COMMUNITY): Payer: Medicare PPO

## 2021-01-17 DIAGNOSIS — Z801 Family history of malignant neoplasm of trachea, bronchus and lung: Secondary | ICD-10-CM | POA: Diagnosis not present

## 2021-01-17 DIAGNOSIS — Z720 Tobacco use: Secondary | ICD-10-CM | POA: Diagnosis not present

## 2021-01-17 DIAGNOSIS — C3412 Malignant neoplasm of upper lobe, left bronchus or lung: Secondary | ICD-10-CM | POA: Diagnosis not present

## 2021-01-18 ENCOUNTER — Encounter (HOSPITAL_COMMUNITY): Payer: Self-pay | Admitting: Hematology

## 2021-01-19 ENCOUNTER — Ambulatory Visit (INDEPENDENT_AMBULATORY_CARE_PROVIDER_SITE_OTHER): Payer: Medicare PPO | Admitting: Thoracic Surgery (Cardiothoracic Vascular Surgery)

## 2021-01-19 ENCOUNTER — Other Ambulatory Visit: Payer: Self-pay

## 2021-01-19 ENCOUNTER — Encounter (HOSPITAL_COMMUNITY): Payer: Self-pay

## 2021-01-19 VITALS — BP 103/66 | HR 120 | Resp 20 | Ht 69.0 in | Wt 161.0 lb

## 2021-01-19 DIAGNOSIS — J984 Other disorders of lung: Secondary | ICD-10-CM | POA: Diagnosis not present

## 2021-01-19 NOTE — Progress Notes (Signed)
6 Minute Walk Test Results  Patient: Bryan Fleming Date:  01/19/2021   Supplemental O2 during test? NO         Baseline   End  Time   1000   1006 Heartrate  120    128 Dyspnea  NO    yes Fatigue  NO    NO O2 sat   95%    98% Blood pressure 103/66    137/73   Patient ambulated at a steady pace for a total distance of 1056 feet with 0 stops.  Ambulation was limited primarily due to tightness in calves  Overall the test was tolerated very well

## 2021-01-19 NOTE — Progress Notes (Signed)
[address]       [place] [address]             [phone]      HPI: [name] returns to further discuss possible surgical resection.  Bryan Fleming is a 60 year old man with a history of tobacco abuse, COPD, ethanol abuse, alcoholic pancreatitis, alcoholic hepatitis, hypertension, hyperlipidemia, reflux, and depression.  He has a 50-pack-year history of smoking.  He was found to have a left upper lobe mass on a low-dose screening CT.  A PET/CT showed the mass was hypermetabolic and there was a suspicious AP window lymph node.  There was questionable activity in the chest wall.  Navigational bronchoscopy showed squamous cell carcinoma.  He saw Dr. Delton Coombes.  He had a port placed and had his first cycle of chemotherapy last week.  He tolerated first cycle of chemo without any difficulty.  I had seen him in May.  Given the likelihood that this was stage IIIa disease, recommended neoadjuvant chemotherapy and smoking cessation prior to potential surgical resection.  Past Medical History:  Diagnosis Date   COPD (chronic obstructive pulmonary disease) ([place]) 2007   EMPHYSEMA   Depression    Dyspnea    occasional - no oxygen   GERD (gastroesophageal reflux disease) 2010   Hepatitis    Alcoholic    HLD (hyperlipidemia)    no meds   Hypertension    Port-A-Cath in place 01/01/2021    Current Outpatient Medications  Medication Sig Dispense Refill   amLODipine (NORVASC) 5 MG tablet Take 5 mg by mouth daily.     aspirin 81 MG tablet Take 81 mg by mouth daily.     CARBOPLATIN IV Inject into the vein every 21 ( twenty-one) days.     lidocaine-prilocaine (EMLA) cream Apply small amount to port a cath site and cover with plastic wrap 1 hour prior to infusion appointments 30 g 3   metoprolol tartrate (LOPRESSOR) 25 MG tablet Take 25 mg by mouth 2 (two) times daily.     Multiple Vitamin (MULTIVITAMIN WITH MINERALS) TABS tablet Take 1 tablet by mouth daily.      NIVOLUMAB IV Inject 360 mg into the vein every 21 ( twenty-one) days.     PACLITAXEL IV Inject 200 mg/m2 into the vein every 21 ( twenty-one) days.     prochlorperazine (COMPAZINE) 10 MG tablet Take 1 tablet (10 mg total) by mouth every 6 (six) hours as needed (Nausea or vomiting). 30 tablet 1   PROVENTIL HFA 108 (90 Base) MCG/ACT inhaler INHALE 2 PUFFS BY MOUTH EVERY 6 HOURS AS NEEDED FOR COUGHING, WHEEZING, OR SHORTNESS OF BREATH (Patient taking differently: Inhale 2 puffs into the lungs every 6 (six) hours as needed for wheezing or shortness of breath (or coughing).) 20.1 g 1   sildenafil (VIAGRA) 50 MG tablet Take 50 mg by mouth daily as needed.     SYMBICORT 160-4.5 MCG/ACT inhaler Inhale 2 puffs into the lungs in the morning and at bedtime.     No current facility-administered medications for this visit.    Physical Exam BP 103/66   Pulse (!) 120   Resp 20   Ht 5\' 9"  (1.753 m)   Wt 161 lb (73 kg)   SpO2 95%   BMI 23.87 kg/m  60 year old man in no acute distress Alert and oriented x3 with no focal deficits Tachycardic and regular Lungs clear  Diagnostic Tests: 6-minute walk test he was able to walk  over 1000 feet.  Mild claudication near end of walk  Impression: Bryan Fleming is a 60 year old man with a history of tobacco abuse, COPD, ethanol abuse, alcoholic pancreatitis, alcoholic hepatitis, hypertension, hyperlipidemia, reflux, and depression.  He had a lung mass found on a low-dose screening CT.  Biopsy showed squamous cell carcinoma.  By PET/CT and MRI of the brain he has clinical stage IIIa disease (T2b, N2).  He does have adequate pulmonary function to tolerate a lobectomy.  His diffusion capacity is limited but is functional status is quite good.  If he is able to quit smoking I think lobectomy is an option.  He is not a candidate for pneumonectomy.  He will complete neoadjuvant treatment and need reimaging.  We can then reassess whether we think this is  resectable with a lobectomy.  Plan: Complete neoadjuvant chemotherapy Return in about 8 weeks after repeat imaging  Melrose Nakayama, MD [facility] [phone]

## 2021-01-20 NOTE — Progress Notes (Signed)
Bryan Fleming, [place] [address]   CLINIC:  Medical Oncology/Hematology  PCP:  Rosita Fire, MD [place] / [place]  [address] [phone]   REASON FOR VISIT:  Follow-up for bronchogenic carcnioma in left upper lobe of lung  PRIOR THERAPY: none  NGS Results: not done  CURRENT THERAPY: under work-up  BRIEF ONCOLOGIC HISTORY:  Oncology History  Primary lung squamous cell carcinoma, left ([place])  12/31/2020 Cancer Staging   Staging form: Lung, AJCC 8th Edition - Clinical stage from 12/31/2020: Stage IIIA (cT2b, cN2, cM0) - Signed by Derek Jack, MD on 01/01/2021  Stage prefix: Initial diagnosis    01/01/2021 Initial Diagnosis   Primary lung squamous cell carcinoma, left ([place])    01/13/2021 -  Chemotherapy    Patient is on Treatment Plan: LUNG NSCLC CARBOPLATIN / PACLITAXEL Q21D X 3 CYCLES         CANCER STAGING: Cancer Staging Primary lung squamous cell carcinoma, left ([place]) Staging form: Lung, AJCC 8th Edition - Clinical stage from 12/31/2020: Stage IIIA (cT2b, cN2, cM0) - Signed by Derek Jack, MD on 01/01/2021   INTERVAL HISTORY:  Bryan Fleming, a 60 y.o. male, returns for routine follow-up of his  bronchogenic carcnioma in left upper lobe of lung. Bryan Fleming was last seen on 12/31/2020.   Today he reports feeling constant numbness in his fingertips beginning yesterday. He denies n/v, but reports diarrhea over the past 2 days around 3-4 times a day. His is currently taking imodium and he has not had aan episode of diarrhea today. His appetite has decreased. He has felt weak over the past couple of days, but he reports feeling better today. He is drinking plenty of fluids. He lost 2 lbs since 06/14.   REVIEW OF SYSTEMS:  Review of Systems  Constitutional:  Positive for appetite change (0%) and fatigue (40%).  Respiratory:  Positive for cough (COPD) and shortness of breath (COPD).    Gastrointestinal:  Positive for diarrhea.  Neurological:  Positive for numbness (fingertips and feet).  All other systems reviewed and are negative.  PAST MEDICAL/SURGICAL HISTORY:  Past Medical History:  Diagnosis Date   COPD (chronic obstructive pulmonary disease) ([place]) 2007   EMPHYSEMA   Depression    Dyspnea    occasional - no oxygen   GERD (gastroesophageal reflux disease) 2010   Hepatitis    Alcoholic    HLD (hyperlipidemia)    no meds   Hypertension    Port-A-Cath in place 01/01/2021   Past Surgical History:  Procedure Laterality Date   BRONCHIAL BIOPSY  12/23/2020   Procedure: BRONCHIAL BIOPSIES;  Surgeon: [name], MD;  Location: [place];  Service: Pulmonary;;   BRONCHIAL BRUSHINGS  12/23/2020   Procedure: BRONCHIAL BRUSHINGS;  Surgeon: [name], MD;  Location: [place];  Service: Pulmonary;;   BRONCHIAL NEEDLE ASPIRATION BIOPSY  12/23/2020   Procedure: BRONCHIAL NEEDLE ASPIRATION BIOPSIES;  Surgeon: [name], MD;  Location: [place];  Service: Pulmonary;;   BRONCHIAL WASHINGS  12/23/2020   Procedure: BRONCHIAL WASHINGS;  Surgeon: [name], MD;  Location[facility] ENDOSCOPY;  Service: Pulmonary;;   CARDIAC CATHETERIZATION  2010   COLONOSCOPY  03/14/2012   Procedure: COLONOSCOPY;  Surgeon: Rogene Houston, MD;  [facility];  Service: Endoscopy;  Laterality: N/A;  100   IR IMAGING GUIDED PORT INSERTION  01/12/2021   VIDEO BRONCHOSCOPY WITH ENDOBRONCHIAL NAVIGATION N/A 12/23/2020   Procedure: VIDEO BRONCHOSCOPY WITH  ENDOBRONCHIAL NAVIGATION;  Surgeon: [name], MD;  Location[facility] ENDOSCOPY;  Service: Pulmonary;  Laterality: N/A;   WRIST GANGLION EXCISION  1985    SOCIAL HISTORY:  Social History   Socioeconomic History   Marital status: Widowed    Spouse name: Not on file   Number of children: Not on file   Years of education: Not on file   Highest education level: Not on file  Occupational History   Not on file   Tobacco Use   Smoking status: Every Day    Packs/day: 1.00    Years: 37.00    Pack years: 37.00    Types: Cigarettes   Smokeless tobacco: Never   Tobacco comments:    smokes 1-1.5 per day 12/22/20  Vaping Use   Vaping Use: Never used  Substance and Sexual Activity   Alcohol use: Yes    Alcohol/week: 22.0 standard drinks    Types: 21 Cans of beer, 1 Standard drinks or equivalent per week    Comment: daily   Drug use: No   Sexual activity: Yes  Other Topics Concern   Not on file  Social History Narrative   Not on file   Social Determinants of Health   Financial Resource Strain: Not on file  Food Insecurity: Not on file  Transportation Needs: No Transportation Needs   Lack of Transportation (Medical): No   Lack of Transportation (Non-Medical): No  Physical Activity: Inactive   Days of Exercise per Week: 0 days   Minutes of Exercise per Session: 0 min  Stress: Not on file  Social Connections: Not on file  Intimate Partner Violence: Not At Risk   Fear of Current or Ex-Partner: No   Emotionally Abused: No   Physically Abused: No   Sexually Abused: No    FAMILY HISTORY:  Family History  Problem Relation Age of Onset   Hypertension Mother    Asthma Father    Diabetes Sister    Hypertension Sister    Hypertension Brother    Hypertension Sister    Lupus Daughter     CURRENT MEDICATIONS:  Current Outpatient Medications  Medication Sig Dispense Refill   amLODipine (NORVASC) 5 MG tablet Take 5 mg by mouth daily.     aspirin 81 MG tablet Take 81 mg by mouth daily.     CARBOPLATIN IV Inject into the vein every 21 ( twenty-one) days.     lidocaine-prilocaine (EMLA) cream Apply small amount to port a cath site and cover with plastic wrap 1 hour prior to infusion appointments 30 g 3   metoprolol tartrate (LOPRESSOR) 25 MG tablet Take 25 mg by mouth 2 (two) times daily.     Multiple Vitamin (MULTIVITAMIN WITH MINERALS) TABS tablet Take 1 tablet by mouth daily.      NIVOLUMAB IV Inject 360 mg into the vein every 21 ( twenty-one) days.     PACLITAXEL IV Inject 200 mg/m2 into the vein every 21 ( twenty-one) days.     prochlorperazine (COMPAZINE) 10 MG tablet Take 1 tablet (10 mg total) by mouth every 6 (six) hours as needed (Nausea or vomiting). 30 tablet 1   PROVENTIL HFA 108 (90 Base) MCG/ACT inhaler INHALE 2 PUFFS BY MOUTH EVERY 6 HOURS AS NEEDED FOR COUGHING, WHEEZING, OR SHORTNESS OF BREATH (Patient taking differently: Inhale 2 puffs into the lungs every 6 (six) hours as needed for wheezing or shortness of breath (or coughing).) 20.1 g 1   sildenafil (VIAGRA) 50 MG tablet Take 50  mg by mouth daily as needed.     SYMBICORT 160-4.5 MCG/ACT inhaler Inhale 2 puffs into the lungs in the morning and at bedtime.     No current facility-administered medications for this visit.    ALLERGIES:  Allergies  Allergen Reactions   Other Nausea Only and Other (See Comments)    No spicy foods- Patient cannot tolerate- HEARTBURN    PHYSICAL EXAM:  Performance status (ECOG): 1 - Symptomatic but completely ambulatory  There were no vitals filed for this visit. Wt Readings from Last 3 Encounters:  01/19/21 161 lb (73 kg)  01/13/21 165 lb 12.6 oz (75.2 kg)  12/31/20 159 lb 1.6 oz (72.2 kg)   Physical Exam Vitals reviewed.  Constitutional:      Appearance: Normal appearance.  HENT:     Mouth/Throat:     Comments: Thrush present on tongue Cardiovascular:     Rate and Rhythm: Normal rate and regular rhythm.     Pulses: Normal pulses.     Heart sounds: Normal heart sounds.  Pulmonary:     Effort: Pulmonary effort is normal.     Breath sounds: Normal breath sounds.  Musculoskeletal:     Right lower leg: No edema.     Left lower leg: No edema.  Neurological:     General: No focal deficit present.     Mental Status: He is alert and oriented to person, place, and time.  Psychiatric:        Mood and Affect: Mood normal.        Behavior: Behavior normal.      LABORATORY DATA:  I have reviewed the labs as listed.  CBC Latest Ref Rng & Units 01/13/2021 12/18/2020 06/05/2020  WBC 4.0 - 10.5 K/uL 8.3 7.8 7.4  Hemoglobin 13.0 - 17.0 g/dL 11.1(L) 11.4(L) 13.6  Hematocrit 39.0 - 52.0 % 34.8(L) 36.0(L) 42.2  Platelets 150 - 400 K/uL 231 264 171   CMP Latest Ref Rng & Units 01/13/2021 12/18/2020 06/05/2020  Glucose 70 - 99 mg/dL 118(H) 114(H) 106(H)  BUN 6 - 20 mg/dL 10 8 5(L)  Creatinine 0.61 - 1.24 mg/dL 0.59(L) 0.61 0.61  Sodium 135 - 145 mmol/L 139 136 133(L)  Potassium 3.5 - 5.1 mmol/L 3.4(L) 4.1 4.4  Chloride 98 - 111 mmol/L 107 103 99  CO2 22 - 32 mmol/L '23 23 26  ' Calcium 8.9 - 10.3 mg/dL 8.9 9.3 8.8(L)  Total Protein 6.5 - 8.1 g/dL 7.7 8.6(H) 6.7  Total Bilirubin 0.3 - 1.2 mg/dL 0.5 0.5 1.1  Alkaline Phos 38 - 126 U/L 72 80 63  AST 15 - 41 U/L 56(H) 91(H) 28  ALT 0 - 44 U/L 33 42 20    DIAGNOSTIC IMAGING:  I have independently reviewed the scans and discussed with the patient. DG Chest Port 1 View  Result Date: 12/23/2020 CLINICAL DATA:  Status post left upper lobe bronchoscopy. EXAM: PORTABLE CHEST 1 VIEW COMPARISON:  12/21/2020 FINDINGS: The heart size and mediastinal contours are within normal limits. Chronic interstitial changes of emphysema identified. The left upper lobe lung mass is again noted. This measures 5.1 cm. No pneumothorax after bronchoscopy and biopsy. Remote right lower posterior rib fracture. The visualized skeletal structures are unremarkable. IMPRESSION: 1. No pneumothorax status post bronchoscopy and biopsy. 2. Left upper lobe lung mass. Electronically Signed   By: Kerby Moors M.D.   On: 12/23/2020 11:58   IR IMAGING GUIDED PORT INSERTION  Result Date: 01/12/2021 INDICATION: Non-small cell lung cancer EXAM: IMPLANTED PORT  A CATH PLACEMENT WITH ULTRASOUND AND FLUOROSCOPIC GUIDANCE MEDICATIONS: None ANESTHESIA/SEDATION: Moderate (conscious) sedation was employed during this procedure. A total of Versed 4 mg and Fentanyl  100 mcg was administered intravenously. Moderate Sedation Time: 16 minutes. The patient's level of consciousness and vital signs were monitored continuously by radiology nursing throughout the procedure under my direct supervision. FLUOROSCOPY TIME:  0 minutes, 18 seconds (2 mGy) COMPLICATIONS: None immediate. PROCEDURE: The procedure, risks, benefits, and alternatives were explained to the patient. Questions regarding the procedure were encouraged and answered. The patient understands and consents to the procedure. A timeout was performed prior to the initiation of the procedure. Patient positioned supine on the angiography table. Right neck and anterior upper chest prepped and draped in the usual sterile fashion. All elements of maximal sterile barrier were utilized including, cap, mask, sterile gown, sterile gloves, large sterile drape, hand scrubbing and 2% Chlorhexidine for skin cleaning. The right internal jugular vein was evaluated with ultrasound and shown to be patent. A permanent ultrasound image was obtained and placed in the patient's medical record. Local anesthesia was provided with 1% lidocaine with epinephrine. Using sterile gel and a sterile probe cover, the right internal jugular vein was entered with a 21 ga needle during real time ultrasound guidance. 0.018 inch guidewire placed and 21 ga needle exchanged for transitional dilator set. Utilizing fluoroscopy, 0.035 inch guidewire advanced through the needle without difficulty. Attention then turned to the right anterior upper chest. Following local lidocaine administration, a port pocket was created. The catheter was connected to the port and brought from the pocket to the venotomy site through a subcutaneous tunnel. The catheter was cut to size and inserted through the peel-away sheath. The catheter tip was positioned at the cavoatrial junction using fluoroscopic guidance. The port aspirated and flushed well. The port pocket was closed with deep  and superficial absorbable suture. The port pocket incision and venotomy sites were also sealed with Dermabond. IMPRESSION: Successful placement of a right internal jugular approach power injectable Port-A-Cath. The catheter is ready for immediate use. Electronically Signed   By: Miachel Roux M.D.   On: 01/12/2021 16:29   DG C-ARM BRONCHOSCOPY  Result Date: 12/23/2020 C-ARM BRONCHOSCOPY: Fluoroscopy was utilized by the requesting physician.  No radiographic interpretation.     ASSESSMENT:  1.  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 Halford Chessman for left upper lobe lung mass. - Is current active smoker, 1/2 pack/day for 35 years. - CT lung cancer screening scan on 10/16/2020 showed cavitary mass in the anterior left upper lobe measuring 4.4 x 3.0 x 3.5 cm with 2.5 cm solid nodular component inferomedially.  This abuts and involves the anterior pleura/chest wall.  Expansile lytic lesion in the right posterolateral ninth and 10th ribs (unchanged dating back to CT from 06/16/2016).  10 mm short axis AP window node suspicious for nodal metastasis. - No change in baseline cough.  No hemoptysis.  No chest pain. - Brain MRI was negative for metastatic disease. - PET scan on 12/14/2020 showed left upper lobe mass measuring 3.9 x 4 cm with SUV 17.1.  Some contiguous hypermetabolism along the left chest wall including anterolateral left third rib with SUV 5.4.  No gross osseous destruction.  AP window lymph node of 9 mm, mildly hypermetabolic, SUV 2.8. - On 02/17/4579 left upper lobe brushing and lavage consistent with squamous cell carcinoma. - Left upper lobe fine-needle aspiration was also consistent with squamous  cell carcinoma.  Lymph node could not be reached. - Caris testing with limited tissue showed MMR proficient, BRAF V6 energy negative, PD-L1 negative by 22 C3, 28-8, SP142.  (IHC/CISH results should be interpreted with caution given the potential for false  negative results) - Cycle 1 of carboplatin, paclitaxel and opdivo on 01/13/2021.   2.  Social/family history: - Previously worked in [employer], currently on disability. - Current active smoker, 1/2 pack/day for 35 years. - Drinks 6 pack of beers along with 1 quart of liquor every day. - 1 brother had throat cancer and another brother had lung cancer.   PLAN:  1.  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 - He had diarrhea for 2 days.  Took Imodium and he did not have any diarrhea today. - He has numbness in the fingertips for the last couple of days.  Reports some decreased appetite.  He lost 2 pounds. - Reviewed his labs today which showed AST elevated at 104.  Total bilirubin was normal.  White count was slightly low at 3.6 with normal ANC.  Platelet count was normal.  TSH was 1.576. - He has tolerated first cycle reasonably well. - RTC 02/03/2021 for cycle 2 with labs.   2.  Smoking cessation: - He has cut back on smoking and in the process of quitting completely.  3.  Oral thrush: - His tongue is covered with thrush.  We will call him Diflucan daily for 7 days with a loading dose.   Orders placed this encounter:  No orders of the defined types were placed in this encounter.    Derek Jack, MD [place] [phone]   I, Thana Ates, am acting as a scribe for Dr. Derek Jack.  I, Derek Jack MD, have reviewed the above documentation for accuracy and completeness, and I agree with the above.

## 2021-01-21 ENCOUNTER — Inpatient Hospital Stay (HOSPITAL_COMMUNITY): Payer: Medicare PPO

## 2021-01-21 ENCOUNTER — Other Ambulatory Visit: Payer: Self-pay

## 2021-01-21 ENCOUNTER — Inpatient Hospital Stay (HOSPITAL_BASED_OUTPATIENT_CLINIC_OR_DEPARTMENT_OTHER): Payer: Medicare PPO | Admitting: Hematology

## 2021-01-21 VITALS — BP 119/62 | HR 97 | Temp 98.4°F | Resp 18 | Wt 159.9 lb

## 2021-01-21 DIAGNOSIS — E785 Hyperlipidemia, unspecified: Secondary | ICD-10-CM | POA: Diagnosis not present

## 2021-01-21 DIAGNOSIS — Z7951 Long term (current) use of inhaled steroids: Secondary | ICD-10-CM | POA: Diagnosis not present

## 2021-01-21 DIAGNOSIS — Z7982 Long term (current) use of aspirin: Secondary | ICD-10-CM | POA: Diagnosis not present

## 2021-01-21 DIAGNOSIS — I10 Essential (primary) hypertension: Secondary | ICD-10-CM | POA: Diagnosis not present

## 2021-01-21 DIAGNOSIS — Z79899 Other long term (current) drug therapy: Secondary | ICD-10-CM | POA: Diagnosis not present

## 2021-01-21 DIAGNOSIS — C3492 Malignant neoplasm of unspecified part of left bronchus or lung: Secondary | ICD-10-CM

## 2021-01-21 DIAGNOSIS — F17210 Nicotine dependence, cigarettes, uncomplicated: Secondary | ICD-10-CM | POA: Diagnosis not present

## 2021-01-21 DIAGNOSIS — Z5111 Encounter for antineoplastic chemotherapy: Secondary | ICD-10-CM | POA: Diagnosis not present

## 2021-01-21 DIAGNOSIS — C3412 Malignant neoplasm of upper lobe, left bronchus or lung: Secondary | ICD-10-CM

## 2021-01-21 DIAGNOSIS — Z5112 Encounter for antineoplastic immunotherapy: Secondary | ICD-10-CM | POA: Diagnosis not present

## 2021-01-21 LAB — CBC WITH DIFFERENTIAL/PLATELET
Basophils Absolute: 0 10*3/uL (ref 0.0–0.1)
Basophils Relative: 0 %
Eosinophils Absolute: 0.1 10*3/uL (ref 0.0–0.5)
Eosinophils Relative: 4 %
HCT: 31.8 % — ABNORMAL LOW (ref 39.0–52.0)
Hemoglobin: 10.4 g/dL — ABNORMAL LOW (ref 13.0–17.0)
Lymphocytes Relative: 36 %
Lymphs Abs: 1.3 10*3/uL (ref 0.7–4.0)
MCH: 28.2 pg (ref 26.0–34.0)
MCHC: 32.7 g/dL (ref 30.0–36.0)
MCV: 86.2 fL (ref 80.0–100.0)
Monocytes Absolute: 0.1 10*3/uL (ref 0.1–1.0)
Monocytes Relative: 3 %
Neutro Abs: 2.1 10*3/uL (ref 1.7–7.7)
Neutrophils Relative %: 57 %
Platelets: 184 10*3/uL (ref 150–400)
RBC: 3.69 MIL/uL — ABNORMAL LOW (ref 4.22–5.81)
RDW: 16.5 % — ABNORMAL HIGH (ref 11.5–15.5)
WBC: 3.6 10*3/uL — ABNORMAL LOW (ref 4.0–10.5)
nRBC: 0 % (ref 0.0–0.2)

## 2021-01-21 LAB — COMPREHENSIVE METABOLIC PANEL
ALT: 39 U/L (ref 0–44)
AST: 104 U/L — ABNORMAL HIGH (ref 15–41)
Albumin: 3.7 g/dL (ref 3.5–5.0)
Alkaline Phosphatase: 65 U/L (ref 38–126)
Anion gap: 8 (ref 5–15)
BUN: 8 mg/dL (ref 6–20)
CO2: 25 mmol/L (ref 22–32)
Calcium: 8.6 mg/dL — ABNORMAL LOW (ref 8.9–10.3)
Chloride: 97 mmol/L — ABNORMAL LOW (ref 98–111)
Creatinine, Ser: 0.65 mg/dL (ref 0.61–1.24)
GFR, Estimated: >60 mL/min (ref >60)
Glucose, Bld: 104 mg/dL — ABNORMAL HIGH (ref 70–99)
Potassium: 3.6 mmol/L (ref 3.5–5.1)
Sodium: 130 mmol/L — ABNORMAL LOW (ref 135–145)
Total Bilirubin: 0.1 mg/dL — ABNORMAL LOW (ref 0.3–1.2)
Total Protein: 7.8 g/dL (ref 6.5–8.1)

## 2021-01-21 LAB — MAGNESIUM: Magnesium: 2.1 mg/dL (ref 1.7–2.4)

## 2021-01-21 LAB — TSH: TSH: 1.576 u[IU]/mL (ref 0.350–4.500)

## 2021-01-21 MED ORDER — FLUCONAZOLE 100 MG PO TABS: ORAL_TABLET | ORAL | 0 refills | Status: DC

## 2021-01-21 NOTE — Patient Instructions (Addendum)
[facility] at [facility] Discharge Instructions  You were seen today by Dr. Delton Coombes. He went over your recent results. Dr. Delton Coombes will see you back in on 02/03/21 for labs and follow up.   Thank you for choosing [facility] at [facility] to provide your oncology and hematology care.  To afford each patient quality time with our provider, please arrive at least 15 minutes before your scheduled appointment time.   If you have a lab appointment with the [facility] please come in thru the Main Entrance and check in at the main information desk  You need to re-schedule your appointment should you arrive 10 or more minutes late.  We strive to give you quality time with our providers, and arriving late affects you and other patients whose appointments are after yours.  Also, if you no show three or more times for appointments you may be dismissed from the clinic at the providers discretion.     Again, thank you for choosing [facility].  Our hope is that these requests will decrease the amount of time that you wait before being seen by our physicians.       _____________________________________________________________  Should you have questions after your visit to [facility], please contact our office at [phone] between the hours of 8:00 a.m. and 4:30 p.m.  Voicemails left after 4:00 p.m. will not be returned until the following business day.  For prescription refill requests, have your pharmacy contact our office and allow 72 hours.    Cancer Center Support Programs:   > Cancer Support Group  2nd Tuesday of the month 1pm-2pm, Journey Room

## 2021-01-23 DIAGNOSIS — I10 Essential (primary) hypertension: Secondary | ICD-10-CM | POA: Diagnosis not present

## 2021-01-23 DIAGNOSIS — F17200 Nicotine dependence, unspecified, uncomplicated: Secondary | ICD-10-CM | POA: Diagnosis not present

## 2021-02-03 ENCOUNTER — Inpatient Hospital Stay (HOSPITAL_COMMUNITY): Payer: Medicare PPO

## 2021-02-03 ENCOUNTER — Other Ambulatory Visit: Payer: Self-pay

## 2021-02-03 ENCOUNTER — Encounter (HOSPITAL_COMMUNITY): Payer: Self-pay | Admitting: Hematology and Oncology

## 2021-02-03 ENCOUNTER — Other Ambulatory Visit (HOSPITAL_COMMUNITY): Payer: Self-pay | Admitting: Hematology and Oncology

## 2021-02-03 ENCOUNTER — Encounter: Payer: Self-pay | Admitting: General Practice

## 2021-02-03 ENCOUNTER — Inpatient Hospital Stay (HOSPITAL_BASED_OUTPATIENT_CLINIC_OR_DEPARTMENT_OTHER): Payer: Medicare PPO | Admitting: Hematology and Oncology

## 2021-02-03 VITALS — BP 132/60 | HR 106 | Temp 96.8°F | Resp 19 | Wt 157.8 lb

## 2021-02-03 VITALS — BP 147/75 | HR 96 | Temp 98.8°F | Resp 18

## 2021-02-03 DIAGNOSIS — I10 Essential (primary) hypertension: Secondary | ICD-10-CM | POA: Diagnosis not present

## 2021-02-03 DIAGNOSIS — J449 Chronic obstructive pulmonary disease, unspecified: Secondary | ICD-10-CM

## 2021-02-03 DIAGNOSIS — Z7951 Long term (current) use of inhaled steroids: Secondary | ICD-10-CM | POA: Diagnosis not present

## 2021-02-03 DIAGNOSIS — C3412 Malignant neoplasm of upper lobe, left bronchus or lung: Secondary | ICD-10-CM | POA: Diagnosis not present

## 2021-02-03 DIAGNOSIS — G620 Drug-induced polyneuropathy: Secondary | ICD-10-CM | POA: Diagnosis not present

## 2021-02-03 DIAGNOSIS — R110 Nausea: Secondary | ICD-10-CM

## 2021-02-03 DIAGNOSIS — F17218 Nicotine dependence, cigarettes, with other nicotine-induced disorders: Secondary | ICD-10-CM

## 2021-02-03 DIAGNOSIS — Z5112 Encounter for antineoplastic immunotherapy: Secondary | ICD-10-CM | POA: Diagnosis not present

## 2021-02-03 DIAGNOSIS — C3492 Malignant neoplasm of unspecified part of left bronchus or lung: Secondary | ICD-10-CM

## 2021-02-03 DIAGNOSIS — E785 Hyperlipidemia, unspecified: Secondary | ICD-10-CM | POA: Diagnosis not present

## 2021-02-03 DIAGNOSIS — T451X5A Adverse effect of antineoplastic and immunosuppressive drugs, initial encounter: Secondary | ICD-10-CM | POA: Diagnosis not present

## 2021-02-03 DIAGNOSIS — F17210 Nicotine dependence, cigarettes, uncomplicated: Secondary | ICD-10-CM | POA: Diagnosis not present

## 2021-02-03 DIAGNOSIS — M2550 Pain in unspecified joint: Secondary | ICD-10-CM | POA: Insufficient documentation

## 2021-02-03 DIAGNOSIS — Z5111 Encounter for antineoplastic chemotherapy: Secondary | ICD-10-CM | POA: Diagnosis not present

## 2021-02-03 DIAGNOSIS — Z7982 Long term (current) use of aspirin: Secondary | ICD-10-CM | POA: Diagnosis not present

## 2021-02-03 DIAGNOSIS — Z95828 Presence of other vascular implants and grafts: Secondary | ICD-10-CM

## 2021-02-03 DIAGNOSIS — Z79899 Other long term (current) drug therapy: Secondary | ICD-10-CM | POA: Diagnosis not present

## 2021-02-03 LAB — CBC WITH DIFFERENTIAL/PLATELET
Abs Immature Granulocytes: 0.04 10*3/uL (ref 0.00–0.07)
Basophils Absolute: 0.1 10*3/uL (ref 0.0–0.1)
Basophils Relative: 1 %
Eosinophils Absolute: 0.1 10*3/uL (ref 0.0–0.5)
Eosinophils Relative: 1 %
HCT: 33.5 % — ABNORMAL LOW (ref 39.0–52.0)
Hemoglobin: 10.7 g/dL — ABNORMAL LOW (ref 13.0–17.0)
Immature Granulocytes: 1 %
Lymphocytes Relative: 37 %
Lymphs Abs: 2.9 10*3/uL (ref 0.7–4.0)
MCH: 27.7 pg (ref 26.0–34.0)
MCHC: 31.9 g/dL (ref 30.0–36.0)
MCV: 86.8 fL (ref 80.0–100.0)
Monocytes Absolute: 0.8 10*3/uL (ref 0.1–1.0)
Monocytes Relative: 10 %
Neutro Abs: 4 10*3/uL (ref 1.7–7.7)
Neutrophils Relative %: 50 %
Platelets: 399 10*3/uL (ref 150–400)
RBC: 3.86 MIL/uL — ABNORMAL LOW (ref 4.22–5.81)
RDW: 18.4 % — ABNORMAL HIGH (ref 11.5–15.5)
WBC: 7.9 10*3/uL (ref 4.0–10.5)
nRBC: 0 % (ref 0.0–0.2)

## 2021-02-03 LAB — COMPREHENSIVE METABOLIC PANEL
ALT: 50 U/L — ABNORMAL HIGH (ref 0–44)
AST: 82 U/L — ABNORMAL HIGH (ref 15–41)
Albumin: 3.5 g/dL (ref 3.5–5.0)
Alkaline Phosphatase: 210 U/L — ABNORMAL HIGH (ref 38–126)
Anion gap: 10 (ref 5–15)
BUN: <5 mg/dL — ABNORMAL LOW (ref 6–20)
CO2: 23 mmol/L (ref 22–32)
Calcium: 8.5 mg/dL — ABNORMAL LOW (ref 8.9–10.3)
Chloride: 103 mmol/L (ref 98–111)
Creatinine, Ser: 0.54 mg/dL — ABNORMAL LOW (ref 0.61–1.24)
GFR, Estimated: >60 mL/min (ref >60)
Glucose, Bld: 113 mg/dL — ABNORMAL HIGH (ref 70–99)
Potassium: 3.7 mmol/L (ref 3.5–5.1)
Sodium: 136 mmol/L (ref 135–145)
Total Bilirubin: 0.3 mg/dL (ref 0.3–1.2)
Total Protein: 7.7 g/dL (ref 6.5–8.1)

## 2021-02-03 LAB — TSH: TSH: 1.414 u[IU]/mL (ref 0.350–4.500)

## 2021-02-03 LAB — MAGNESIUM: Magnesium: 2.4 mg/dL (ref 1.7–2.4)

## 2021-02-03 MED ORDER — DIPHENHYDRAMINE HCL 50 MG/ML IJ SOLN
INTRAMUSCULAR | Status: AC
  Filled 2021-02-03: qty 1

## 2021-02-03 MED ORDER — SODIUM CHLORIDE 0.9% FLUSH
10.0000 mL | INTRAVENOUS | Status: DC | PRN
  Administered 2021-02-03: 10 mL

## 2021-02-03 MED ORDER — SODIUM CHLORIDE 0.9 % IV SOLN
175.0000 mg/m2 | Freq: Once | INTRAVENOUS | Status: AC
  Administered 2021-02-03: 330 mg via INTRAVENOUS
  Filled 2021-02-03: qty 55

## 2021-02-03 MED ORDER — HEPARIN SOD (PORK) LOCK FLUSH 100 UNIT/ML IV SOLN
500.0000 [IU] | Freq: Once | INTRAVENOUS | Status: AC | PRN
  Administered 2021-02-03: 500 [IU]

## 2021-02-03 MED ORDER — PALONOSETRON HCL INJECTION 0.25 MG/5ML
INTRAVENOUS | Status: AC
  Filled 2021-02-03: qty 5

## 2021-02-03 MED ORDER — SODIUM CHLORIDE 0.9 % IV SOLN
10.0000 mg | Freq: Once | INTRAVENOUS | Status: AC
  Administered 2021-02-03: 10 mg via INTRAVENOUS
  Filled 2021-02-03: qty 10

## 2021-02-03 MED ORDER — SODIUM CHLORIDE 0.9 % IV SOLN: Freq: Once | INTRAVENOUS | Status: AC

## 2021-02-03 MED ORDER — SODIUM CHLORIDE 0.9 % IV SOLN
761.4000 mg | Freq: Once | INTRAVENOUS | Status: AC
  Administered 2021-02-03: 760 mg via INTRAVENOUS
  Filled 2021-02-03: qty 76

## 2021-02-03 MED ORDER — SODIUM CHLORIDE 0.9 % IV SOLN
360.0000 mg | Freq: Once | INTRAVENOUS | Status: AC
  Administered 2021-02-03: 360 mg via INTRAVENOUS
  Filled 2021-02-03: qty 12

## 2021-02-03 MED ORDER — OXYCODONE HCL 5 MG PO TABS: 5.0000 mg | ORAL_TABLET | ORAL | 0 refills | Status: DC | PRN

## 2021-02-03 MED ORDER — FAMOTIDINE 20 MG IN NS 100 ML IVPB
20.0000 mg | Freq: Once | INTRAVENOUS | Status: AC
  Administered 2021-02-03: 20 mg via INTRAVENOUS
  Filled 2021-02-03: qty 20

## 2021-02-03 MED ORDER — DIPHENHYDRAMINE HCL 50 MG/ML IJ SOLN
25.0000 mg | Freq: Once | INTRAMUSCULAR | Status: AC
  Administered 2021-02-03: 25 mg via INTRAVENOUS

## 2021-02-03 MED ORDER — PALONOSETRON HCL INJECTION 0.25 MG/5ML
0.2500 mg | Freq: Once | INTRAVENOUS | Status: AC
Start: 2021-02-03 — End: 2021-02-03
  Administered 2021-02-03: 0.25 mg via INTRAVENOUS

## 2021-02-03 MED ORDER — SODIUM CHLORIDE 0.9 % IV SOLN
150.0000 mg | Freq: Once | INTRAVENOUS | Status: AC
  Administered 2021-02-03: 150 mg via INTRAVENOUS
  Filled 2021-02-03: qty 150

## 2021-02-03 NOTE — Progress Notes (Signed)
[facility] FOLLOW-UP progress notes  Patient Care Team: Rosita Fire, MD as PCP - General (Internal Medicine) Brien Mates, RN as Oncology Nurse Navigator (Oncology)  CHIEF COMPLAINTS/PURPOSE OF VISIT:  Lung cancer, for further treatment  HISTORY OF PRESENTING ILLNESS:  Bryan Fleming 60 y.o. male is seen today because his primary oncologist is not available Since his last dose of treatment, he had multiple side effects He had signs of peripheral neuropathy affecting his fingers and toes He had some nausea but did not take any antiemetics He had some loose stool He had diffuse joint pain after treatment He is attempting to quit smoking but continues to smoke at least more than 10 cigarettes over the past few days  I reviewed the patient's records extensive and collaborated the history with the patient. Summary of his history is as follows: Oncology History  Primary lung squamous cell carcinoma, left ([place])  12/31/2020 Cancer Staging   Staging form: Lung, AJCC 8th Edition - Clinical stage from 12/31/2020: Stage IIIA (cT2b, cN2, cM0) - Signed by Derek Jack, MD on 01/01/2021  Stage prefix: Initial diagnosis    01/01/2021 Initial Diagnosis   Primary lung squamous cell carcinoma, left ([place])    01/13/2021 -  Chemotherapy    Patient is on Treatment Plan: LUNG NSCLC CARBOPLATIN / PACLITAXEL Q21D X 3 CYCLES         MEDICAL HISTORY:  Past Medical History:  Diagnosis Date   COPD (chronic obstructive pulmonary disease) ([place]) 2007   EMPHYSEMA   Depression    Dyspnea    occasional - no oxygen   GERD (gastroesophageal reflux disease) 2010   Hepatitis    Alcoholic    HLD (hyperlipidemia)    no meds   Hypertension    Port-A-Cath in place 01/01/2021    SURGICAL HISTORY: Past Surgical History:  Procedure Laterality Date   BRONCHIAL BIOPSY  12/23/2020   Procedure: BRONCHIAL BIOPSIES;  Surgeon: [name], MD;  Location: [facility] ENDOSCOPY;   Service: Pulmonary;;   BRONCHIAL BRUSHINGS  12/23/2020   Procedure: BRONCHIAL BRUSHINGS;  Surgeon: [name], MD;  Location: [facility] ENDOSCOPY;  Service: Pulmonary;;   BRONCHIAL NEEDLE ASPIRATION BIOPSY  12/23/2020   Procedure: BRONCHIAL NEEDLE ASPIRATION BIOPSIES;  Surgeon: [name], MD;  Location: [facility] ENDOSCOPY;  Service: Pulmonary;;   BRONCHIAL WASHINGS  12/23/2020   Procedure: BRONCHIAL WASHINGS;  Surgeon: [name], MD;  Location: [facility] ENDOSCOPY;  Service: Pulmonary;;   CARDIAC CATHETERIZATION  2010   COLONOSCOPY  03/14/2012   Procedure: COLONOSCOPY;  Surgeon: Rogene Houston, MD;  Location: [facility] ENDO SUITE;  Service: Endoscopy;  Laterality: N/A;  100   IR IMAGING GUIDED PORT INSERTION  01/12/2021   VIDEO BRONCHOSCOPY WITH ENDOBRONCHIAL NAVIGATION N/A 12/23/2020   Procedure: VIDEO BRONCHOSCOPY WITH ENDOBRONCHIAL NAVIGATION;  Surgeon: [name], MD;  Location: [place] ENDOSCOPY;  Service: Pulmonary;  Laterality: N/A;   WRIST GANGLION EXCISION  1985    SOCIAL HISTORY: Social History   Socioeconomic History   Marital status: Widowed    Spouse name: Not on file   Number of children: Not on file   Years of education: Not on file   Highest education level: Not on file  Occupational History   Not on file  Tobacco Use   Smoking status: Every Day    Packs/day: 1.00    Years: 37.00    Pack years: 37.00    Types: Cigarettes   Smokeless tobacco: Never   Tobacco comments:  smokes 1-1.5 per day 12/22/20  Vaping Use   Vaping Use: Never used  Substance and Sexual Activity   Alcohol use: Yes    Alcohol/week: 22.0 standard drinks    Types: 21 Cans of beer, 1 Standard drinks or equivalent per week    Comment: daily   Drug use: No   Sexual activity: Yes  Other Topics Concern   Not on file  Social History Narrative   Not on file   Social Determinants of Health   Financial Resource Strain: Not on file  Food Insecurity: Not on file  Transportation Needs: No Transportation Needs    Lack of Transportation (Medical): No   Lack of Transportation (Non-Medical): No  Physical Activity: Inactive   Days of Exercise per Week: 0 days   Minutes of Exercise per Session: 0 min  Stress: Not on file  Social Connections: Not on file  Intimate Partner Violence: Not At Risk   Fear of Current or Ex-Partner: No   Emotionally Abused: No   Physically Abused: No   Sexually Abused: No    FAMILY HISTORY: Family History  Problem Relation Age of Onset   Hypertension Mother    Asthma Father    Diabetes Sister    Hypertension Sister    Hypertension Brother    Hypertension Sister    Lupus Daughter     ALLERGIES:  is allergic to other.  MEDICATIONS:  Current Outpatient Medications  Medication Sig Dispense Refill   amLODipine (NORVASC) 5 MG tablet Take 5 mg by mouth daily.     aspirin 81 MG tablet Take 81 mg by mouth daily.     CARBOPLATIN IV Inject into the vein every 21 ( twenty-one) days.     metoprolol tartrate (LOPRESSOR) 25 MG tablet Take 25 mg by mouth 2 (two) times daily.     Multiple Vitamin (MULTIVITAMIN WITH MINERALS) TABS tablet Take 1 tablet by mouth daily.     NIVOLUMAB IV Inject 360 mg into the vein every 21 ( twenty-one) days.     oxyCODONE (OXY IR/ROXICODONE) 5 MG immediate release tablet Take 1 tablet (5 mg total) by mouth every 4 (four) hours as needed for severe pain. 30 tablet 0   PACLITAXEL IV Inject 200 mg/m2 into the vein every 21 ( twenty-one) days.     sildenafil (VIAGRA) 50 MG tablet Take 50 mg by mouth daily as needed.     SYMBICORT 160-4.5 MCG/ACT inhaler Inhale 2 puffs into the lungs in the morning and at bedtime.     lidocaine-prilocaine (EMLA) cream Apply small amount to port a cath site and cover with plastic wrap 1 hour prior to infusion appointments (Patient not taking: Reported on 02/03/2021) 30 g 3   prochlorperazine (COMPAZINE) 10 MG tablet Take 1 tablet (10 mg total) by mouth every 6 (six) hours as needed (Nausea or vomiting). (Patient not  taking: Reported on 02/03/2021) 30 tablet 1   PROVENTIL HFA 108 (90 Base) MCG/ACT inhaler INHALE 2 PUFFS BY MOUTH EVERY 6 HOURS AS NEEDED FOR COUGHING, WHEEZING, OR SHORTNESS OF BREATH (Patient not taking: Reported on 02/03/2021) 20.1 g 1   No current facility-administered medications for this visit.   Facility-Administered Medications Ordered in Other Visits  Medication Dose Route Frequency Provider Last Rate Last Admin   CARBOplatin (PARAPLATIN) 760 mg in sodium chloride 0.9 % 250 mL chemo infusion  760 mg Intravenous Once Deidrick Rainey, MD       heparin lock flush 100 unit/mL  500 Units Intracatheter Once  PRN Heath Lark, MD       nivolumab (OPDIVO) 360 mg in sodium chloride 0.9 % 100 mL chemo infusion  360 mg Intravenous Once Alvy Bimler, Jazsmine Macari, MD       PACLitaxel (TAXOL) 330 mg in sodium chloride 0.9 % 500 mL chemo infusion (> 80mg /m2)  175 mg/m2 (Treatment Plan Recorded) Intravenous Once Alvy Bimler, Tailor Lucking, MD       sodium chloride flush (NS) 0.9 % injection 10 mL  10 mL Intracatheter PRN Alvy Bimler, Jazyah Butsch, MD        REVIEW OF SYSTEMS:   Constitutional: Denies fevers, chills or abnormal night sweats Eyes: Denies blurriness of vision, double vision or watery eyes Ears, nose, mouth, throat, and face: Denies mucositis or sore throat Respiratory: Denies cough, dyspnea or wheezes Skin: Denies abnormal skin rashes Lymphatics: Denies new lymphadenopathy or easy bruising Behavioral/Psych: Mood is stable, no new changes  All other systems were reviewed with the patient and are negative.  PHYSICAL EXAMINATION: ECOG PERFORMANCE STATUS: 1 - Symptomatic but completely ambulatory  Vitals:   02/03/21 0828  BP: 132/60  Pulse: (!) 106  Resp: 19  Temp: (!) 96.8 F (36 C)  SpO2: 96%   Filed Weights   02/03/21 0828  Weight: 157 lb 12.8 oz (71.6 kg)    GENERAL:alert, no distress and comfortable SKIN: skin color, texture, turgor are normal, no rashes or significant lesions EYES: normal, conjunctiva are pink and  non-injected, sclera clear OROPHARYNX:no exudate, normal lips, buccal mucosa, and tongue  NECK: supple, thyroid normal size, non-tender, without nodularity LYMPH:  no palpable lymphadenopathy in the cervical, axillary or inguinal LUNGS: He has expiratory wheezes bilaterally HEART: regular rate & rhythm and no murmurs without lower extremity edema ABDOMEN:abdomen soft, non-tender and normal bowel sounds Musculoskeletal:no cyanosis of digits and no clubbing  PSYCH: alert & oriented x 3 with fluent speech NEURO: no focal motor/sensory deficits  LABORATORY DATA:  I have reviewed the data as listed Lab Results  Component Value Date   WBC 7.9 02/03/2021   HGB 10.7 (L) 02/03/2021   HCT 33.5 (L) 02/03/2021   MCV 86.8 02/03/2021   PLT 399 02/03/2021   Recent Labs    01/13/21 0847 01/21/21 1203 02/03/21 0844  NA 139 130* 136  K 3.4* 3.6 3.7  CL 107 97* 103  CO2 23 25 23   GLUCOSE 118* 104* 113*  BUN 10 8 <5*  CREATININE 0.59* 0.65 0.54*  CALCIUM 8.9 8.6* 8.5*  GFRNONAA >60 >60 >60  PROT 7.7 7.8 7.7  ALBUMIN 3.7 3.7 3.5  AST 56* 104* 82*  ALT 33 39 50*  ALKPHOS 72 65 210*  BILITOT 0.5 0.1* 0.3    RADIOGRAPHIC STUDIES: I have personally reviewed the radiological images as listed and agreed with the findings in the report. IR IMAGING GUIDED PORT INSERTION  Result Date: 01/12/2021 INDICATION: Non-small cell lung cancer EXAM: IMPLANTED PORT A CATH PLACEMENT WITH ULTRASOUND AND FLUOROSCOPIC GUIDANCE MEDICATIONS: None ANESTHESIA/SEDATION: Moderate (conscious) sedation was employed during this procedure. A total of Versed 4 mg and Fentanyl 100 mcg was administered intravenously. Moderate Sedation Time: 16 minutes. The patient's level of consciousness and vital signs were monitored continuously by radiology nursing throughout the procedure under my direct supervision. FLUOROSCOPY TIME:  0 minutes, 18 seconds (2 mGy) COMPLICATIONS: None immediate. PROCEDURE: The procedure, risks, benefits,  and alternatives were explained to the patient. Questions regarding the procedure were encouraged and answered. The patient understands and consents to the procedure. A timeout was performed prior to the  initiation of the procedure. Patient positioned supine on the angiography table. Right neck and anterior upper chest prepped and draped in the usual sterile fashion. All elements of maximal sterile barrier were utilized including, cap, mask, sterile gown, sterile gloves, large sterile drape, hand scrubbing and 2% Chlorhexidine for skin cleaning. The right internal jugular vein was evaluated with ultrasound and shown to be patent. A permanent ultrasound image was obtained and placed in the patient's medical record. Local anesthesia was provided with 1% lidocaine with epinephrine. Using sterile gel and a sterile probe cover, the right internal jugular vein was entered with a 21 ga needle during real time ultrasound guidance. 0.018 inch guidewire placed and 21 ga needle exchanged for transitional dilator set. Utilizing fluoroscopy, 0.035 inch guidewire advanced through the needle without difficulty. Attention then turned to the right anterior upper chest. Following local lidocaine administration, a port pocket was created. The catheter was connected to the port and brought from the pocket to the venotomy site through a subcutaneous tunnel. The catheter was cut to size and inserted through the peel-away sheath. The catheter tip was positioned at the cavoatrial junction using fluoroscopic guidance. The port aspirated and flushed well. The port pocket was closed with deep and superficial absorbable suture. The port pocket incision and venotomy sites were also sealed with Dermabond. IMPRESSION: Successful placement of a right internal jugular approach power injectable Port-A-Cath. The catheter is ready for immediate use. Electronically Signed   By: Miachel Roux M.D.   On: 01/12/2021 16:29    ASSESSMENT & PLAN:  Primary  lung squamous cell carcinoma, left (HCC) He tolerated cycle 1 of treatment poorly with significant neuropathy, nausea and joint pain We will proceed with treatment but I plan to reduce Taxol to 175 mg per metered square I advised him to stop smoking Advised him to take antiemetics as needed I prescribed a small dose of pain medicine for him to take as needed for joint pain  COPD (chronic obstructive pulmonary disease) ([place]) He has expiratory wheezes on exam We discussed the importance of smoking cessation  Nicotine dependence, cigarettes, with other nicotine-induced disorders We have extensive discussion about the importance of smoking cessation  Peripheral neuropathy due to chemotherapy [facility]) he has mild peripheral neuropathy, likely related to side effects of treatment. I plan to reduce the dose of treatment as outlined above.  I explained to the patient the rationale of this strategy and reassured the patient it would not compromise the efficacy of treatment   Joint pain Likely due to side effects of Taxol Hopefully, dose adjustment will help The patient requested pain medicine We discussed narcotic refill policy and I warned him about side effects of treatment  Nausea without vomiting Advised him to take antiemetics as needed  No orders of the defined types were placed in this encounter.   All questions were answered. The patient knows to call the clinic with any problems, questions or concerns. The total time spent in the appointment was 40 minutes encounter with patients including review of chart and various tests results, discussions about plan of care and coordination of care plan   Heath Lark, MD 02/03/2021 11:21 AM

## 2021-02-03 NOTE — Assessment & Plan Note (Signed)
Advised him to take antiemetics as needed

## 2021-02-03 NOTE — Assessment & Plan Note (Signed)
He tolerated cycle 1 of treatment poorly with significant neuropathy, nausea and joint pain We will proceed with treatment but I plan to reduce Taxol to 175 mg per metered square I advised him to stop smoking Advised him to take antiemetics as needed I prescribed a small dose of pain medicine for him to take as needed for joint pain

## 2021-02-03 NOTE — Assessment & Plan Note (Signed)
He has expiratory wheezes on exam We discussed the importance of smoking cessation

## 2021-02-03 NOTE — Assessment & Plan Note (Signed)
Likely due to side effects of Taxol Hopefully, dose adjustment will help The patient requested pain medicine We discussed narcotic refill policy and I warned him about side effects of treatment

## 2021-02-03 NOTE — Progress Notes (Signed)
[facility] CSW Progress Notes  Request from Nurse Navigator Sylvester Harder to speak w significant other - no concerns identified.  Called significant other, she is interested in financial help w gas and medications.  They are currently at [facility] - advised to ask to speak w Financial ADvocate A Sharlet [place].  CSW will refer to [facility] for their gas card program.  Edwyna Shell, LCSW Clinical Social Worker Phone:  [phone]

## 2021-02-03 NOTE — Assessment & Plan Note (Signed)
We have extensive discussion about the importance of smoking cessation

## 2021-02-03 NOTE — Assessment & Plan Note (Signed)
he has mild peripheral neuropathy, likely related to side effects of treatment. I plan to reduce the dose of treatment as outlined above.  I explained to the patient the rationale of this strategy and reassured the patient it would not compromise the efficacy of treatment  

## 2021-02-03 NOTE — Patient Instructions (Signed)
[facility]  Discharge Instructions: Thank you for choosing [facility] to provide your oncology and hematology care.  If you have a lab appointment with the [facility], please come in thru the Main Entrance and check in at the main information desk.  Wear comfortable clothing and clothing appropriate for easy access to any Portacath or PICC line.   We strive to give you quality time with your provider. You may need to reschedule your appointment if you arrive late (15 or more minutes).  Arriving late affects you and other patients whose appointments are after yours.  Also, if you miss three or more appointments without notifying the office, you may be dismissed from the clinic at the provider's discretion.      For prescription refill requests, have your pharmacy contact our office and allow 72 hours for refills to be completed.    Today you received the following chemotherapy and/or immunotherapy agents Opdivo, Taxol, Carboplatin.       To help prevent nausea and vomiting after your treatment, we encourage you to take your nausea medication as directed.  BELOW ARE SYMPTOMS THAT SHOULD BE REPORTED IMMEDIATELY: *FEVER GREATER THAN 100.4 F (38 C) OR HIGHER *CHILLS OR SWEATING *NAUSEA AND VOMITING THAT IS NOT CONTROLLED WITH YOUR NAUSEA MEDICATION *UNUSUAL SHORTNESS OF BREATH *UNUSUAL BRUISING OR BLEEDING *URINARY PROBLEMS (pain or burning when urinating, or frequent urination) *BOWEL PROBLEMS (unusual diarrhea, constipation, pain near the anus) TENDERNESS IN MOUTH AND THROAT WITH OR WITHOUT PRESENCE OF ULCERS (sore throat, sores in mouth, or a toothache) UNUSUAL RASH, SWELLING OR PAIN  UNUSUAL VAGINAL DISCHARGE OR ITCHING   Items with * indicate a potential emergency and should be followed up as soon as possible or go to the Emergency Department if any problems should occur.  Please show the CHEMOTHERAPY ALERT CARD or IMMUNOTHERAPY ALERT CARD at check-in to  the Emergency Department and triage nurse.  Should you have questions after your visit or need to cancel or reschedule your appointment, please contact [facility] [phone]  and follow the prompts.  Office hours are 8:00 a.m. to 4:30 p.m. Monday - Friday. Please note that voicemails left after 4:00 p.m. may not be returned until the following business day.  We are closed weekends and major holidays. You have access to a nurse at all times for urgent questions. Please call the main number to the clinic [phone] and follow the prompts.  For any non-urgent questions, you may also contact your provider using MyChart. We now offer e-Visits for anyone 59 and older to request care online for non-urgent symptoms. For details visit [email or web address].   Also download the MyChart app! Go to the app store, search "MyChart", open the app, select [facility], and log in with your MyChart username and password.  Due to Covid, a mask is required upon entering the hospital/clinic. If you do not have a mask, one will be given to you upon arrival. For doctor visits, patients may have 1 support person aged 59 or older with them. For treatment visits, patients cannot have anyone with them due to current Covid guidelines and our immunocompromised population.

## 2021-02-03 NOTE — Progress Notes (Signed)
Patient presents today for treatment and follow up visit with Dr. Alvy Bimler. Vital signs stable. Heart rate on arrival 106. Patient has complaints of pain in his joints bilateral hands and knees that he states started a couple days after his first treatment. Patient requesting pain medication to manage his pain due to over the counter medications did not help. Per patient's words. Patient states he had episodes of diarrhea which he used Imodium and it resolved. No other complaints noted. Labs pending.   AST 82. Heart rate recheck 98.   Patient seen by Dr. Alvy Bimler. Labs reviewed. Treatment plan signed. Message received from Dr. Alvy Bimler to proceed with treatment. Labs reviewed by MD.   Treatment given today per MD orders. Tolerated infusion without adverse affects. Vital signs stable. No complaints at this time. Discharged from clinic ambulatory in stable condition. Alert and oriented x 3. F/U with [facility] as scheduled.

## 2021-02-04 ENCOUNTER — Telehealth (HOSPITAL_COMMUNITY): Payer: Self-pay | Admitting: Dietician

## 2021-02-04 ENCOUNTER — Telehealth (HOSPITAL_COMMUNITY): Payer: Self-pay | Admitting: Hematology

## 2021-02-04 ENCOUNTER — Encounter (HOSPITAL_COMMUNITY): Payer: Medicare PPO | Admitting: Dietician

## 2021-02-04 NOTE — Telephone Encounter (Signed)
Spoke with pts SGO in regards to applying for the [facility] fin assist program. Informed pt of how it works and obtained verbal income verification. Pt will bring in a copy of income on 02/09/21. Afterwards, he will be enrolled in the [facility] program.

## 2021-02-04 NOTE — Telephone Encounter (Signed)
Nutrition  Attempted to contact patient via telephone for scheduled nutrition appointment. Patient did not answer. Left voicemail with request for return call. Contact information provided.

## 2021-02-05 ENCOUNTER — Telehealth: Payer: Self-pay

## 2021-02-05 NOTE — Telephone Encounter (Signed)
-----   Message from Heath Lark, MD sent at 02/05/2021  7:53 AM EDT ----- Can you call him and check on how his pain is doing?

## 2021-02-05 NOTE — Telephone Encounter (Signed)
Called and given below message. He verbalized understanding. Bryan Fleming is about the same. Worse in the am when he gets up and intermittent during the day. Pian mostly in joints, ankles and wrist. He is taking the Oxycodone q 4 hours.  Complaining of lack appetite. Maybe some nausea, he is not sure. Instructed to take the Compazine q 6 hour prn for nausea. Eat multiple small meals. He verbalized understanding.

## 2021-02-15 ENCOUNTER — Ambulatory Visit (HOSPITAL_COMMUNITY): Payer: Medicare PPO | Admitting: Hematology

## 2021-02-21 ENCOUNTER — Encounter (HOSPITAL_COMMUNITY): Payer: Self-pay

## 2021-02-22 ENCOUNTER — Encounter (HOSPITAL_COMMUNITY): Payer: Self-pay | Admitting: *Deleted

## 2021-02-22 ENCOUNTER — Other Ambulatory Visit (HOSPITAL_COMMUNITY): Payer: Self-pay | Admitting: *Deleted

## 2021-02-22 ENCOUNTER — Other Ambulatory Visit (HOSPITAL_COMMUNITY): Payer: Self-pay

## 2021-02-22 MED ORDER — GABAPENTIN 300 MG PO CAPS: 300.0000 mg | ORAL_CAPSULE | Freq: Three times a day (TID) | ORAL | 3 refills | Status: DC

## 2021-02-22 MED ORDER — OXYCODONE HCL 5 MG PO TABS: 5.0000 mg | ORAL_TABLET | ORAL | 0 refills | Status: DC | PRN

## 2021-02-23 DIAGNOSIS — I10 Essential (primary) hypertension: Secondary | ICD-10-CM | POA: Diagnosis not present

## 2021-02-23 DIAGNOSIS — J449 Chronic obstructive pulmonary disease, unspecified: Secondary | ICD-10-CM | POA: Diagnosis not present

## 2021-02-23 NOTE — Progress Notes (Signed)
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  PRIOR THERAPY: none  NGS Results: not done  CURRENT THERAPY: carboplatin, paclitaxel and nivolumab q21d x3 cycles  BRIEF ONCOLOGIC HISTORY:  Oncology History  Primary lung squamous cell carcinoma, left ([place])  12/31/2020 Cancer Staging   Staging form: Lung, AJCC 8th Edition - Clinical stage from 12/31/2020: Stage IIIA (cT2b, cN2, cM0) - Signed by Derek Jack, MD on 01/01/2021  Stage prefix: Initial diagnosis    01/01/2021 Initial Diagnosis   Primary lung squamous cell carcinoma, left ([place])    01/13/2021 -  Chemotherapy    Patient is on Treatment Plan: LUNG NSCLC CARBOPLATIN / PACLITAXEL Q21D X 3 CYCLES         CANCER STAGING: Cancer Staging Primary lung squamous cell carcinoma, left ([place]) Staging form: Lung, AJCC 8th Edition - Clinical stage from 12/31/2020: Stage IIIA (cT2b, cN2, cM0) - Signed by Derek Jack, MD on 01/01/2021 Virtual Visit via Video Note  I connected with [name] on '@TODAY' @ at  9:00 AM EDT by a video enabled telemedicine application and verified that I am speaking with the correct person using two identifiers.  Location: Patient: clinic Provider: home  Others present: Renda Rolls, RN  I discussed the limitations of evaluation and management by telemedicine and the availability of in person appointments. The patient expressed understanding and agreed to proceed.   INTERVAL HISTORY:  [name], a 60 y.o. male, returns for routine follow-up and consideration for next cycle of chemotherapy. Hafiz was last seen on 01/21/21.  Due for cycle #3 of carboplatin, paclitaxel and nivolumab today.   Overall, he tells me he has been  feeling pretty well. He reports constant numbness in his fingers and toes which has increased the difficulty of picking up and holding objects, but he is still able to do so. He is taking gabapentin which is helping. He reports constant pain in his knees and elbows since starting chemo; he denies any history of joint pain or RA. He has had intermittent soft diarrhea since beginning chemo with 2 episodes since his last treatment. He denies skin rash and itching. He is currently smoking 1/2 ppd daily. He reports he has been trying to quit, but has been unable to.   Overall, he feels ready for next cycle of chemo today.   REVIEW OF SYSTEMS:  Review of Systems  Gastrointestinal:  Positive for diarrhea.  Musculoskeletal:  Positive for arthralgias (knees and elbows).  Skin:  Negative for itching and rash.  Neurological:  Positive for numbness (fingers and toes).   PAST MEDICAL/SURGICAL HISTORY:  Past Medical History:  Diagnosis Date   COPD (chronic obstructive pulmonary disease) ([place]) 2007   EMPHYSEMA   Depression    Dyspnea    occasional - no oxygen   GERD (gastroesophageal reflux disease) 2010   Hepatitis    Alcoholic    HLD (hyperlipidemia)    no meds   Hypertension    Port-A-Cath in place 01/01/2021   Past Surgical History:  Procedure Laterality Date   BRONCHIAL BIOPSY  12/23/2020   Procedure: BRONCHIAL BIOPSIES;  Surgeon: [name], MD;  Location: [facility] ENDOSCOPY;  Service: Pulmonary;;   BRONCHIAL BRUSHINGS  12/23/2020   Procedure: BRONCHIAL BRUSHINGS;  Surgeon: Lamonte Sakai,  Rose Fillers, MD;  Location: [facility] ENDOSCOPY;  Service: Pulmonary;;   BRONCHIAL NEEDLE ASPIRATION BIOPSY  12/23/2020   Procedure: BRONCHIAL NEEDLE ASPIRATION BIOPSIES;  Surgeon: [name], MD;  Location: [facility] ENDOSCOPY;  Service: Pulmonary;;   BRONCHIAL WASHINGS  12/23/2020   Procedure: BRONCHIAL WASHINGS;  Surgeon: [name], MD;  Location: [facility] ENDOSCOPY;  Service: Pulmonary;;   CARDIAC CATHETERIZATION  2010    COLONOSCOPY  03/14/2012   Procedure: COLONOSCOPY;  Surgeon: Rogene Houston, MD;  Location: [facility] ENDO SUITE;  Service: Endoscopy;  Laterality: N/A;  100   IR IMAGING GUIDED PORT INSERTION  01/12/2021   VIDEO BRONCHOSCOPY WITH ENDOBRONCHIAL NAVIGATION N/A 12/23/2020   Procedure: VIDEO BRONCHOSCOPY WITH ENDOBRONCHIAL NAVIGATION;  Surgeon: [name], MD;  Location: [facility] ENDOSCOPY;  Service: Pulmonary;  Laterality: N/A;   WRIST GANGLION EXCISION  1985    SOCIAL HISTORY:  Social History   Socioeconomic History   Marital status: Widowed    Spouse name: Not on file   Number of children: Not on file   Years of education: Not on file   Highest education level: Not on file  Occupational History   Not on file  Tobacco Use   Smoking status: Every Day    Packs/day: 1.00    Years: 37.00    Pack years: 37.00    Types: Cigarettes   Smokeless tobacco: Never   Tobacco comments:    smokes 1-1.5 per day 12/22/20  Vaping Use   Vaping Use: Never used  Substance and Sexual Activity   Alcohol use: Yes    Alcohol/week: 22.0 standard drinks    Types: 21 Cans of beer, 1 Standard drinks or equivalent per week    Comment: daily   Drug use: No   Sexual activity: Yes  Other Topics Concern   Not on file  Social History Narrative   Not on file   Social Determinants of Health   Financial Resource Strain: Not on file  Food Insecurity: Not on file  Transportation Needs: No Transportation Needs   Lack of Transportation (Medical): No   Lack of Transportation (Non-Medical): No  Physical Activity: Inactive   Days of Exercise per Week: 0 days   Minutes of Exercise per Session: 0 min  Stress: Not on file  Social Connections: Not on file  Intimate Partner Violence: Not At Risk   Fear of Current or Ex-Partner: No   Emotionally Abused: No   Physically Abused: No   Sexually Abused: No    FAMILY HISTORY:  Family History  Problem Relation Age of Onset   Hypertension Mother    Asthma Father    Diabetes  Sister    Hypertension Sister    Hypertension Brother    Hypertension Sister    Lupus Daughter     CURRENT MEDICATIONS:  Current Outpatient Medications  Medication Sig Dispense Refill   amLODipine (NORVASC) 5 MG tablet Take 5 mg by mouth daily.     aspirin 81 MG tablet Take 81 mg by mouth daily.     CARBOPLATIN IV Inject into the vein every 21 ( twenty-one) days.     gabapentin (NEURONTIN) 300 MG capsule Take 1 capsule (300 mg total) by mouth 3 (three) times daily. 90 capsule 3   lidocaine-prilocaine (EMLA) cream Apply small amount to port a cath site and cover with plastic wrap 1 hour prior to infusion appointments (Patient not taking: Reported on 02/03/2021) 30 g 3   metoprolol tartrate (LOPRESSOR) 25 MG tablet Take 25  mg by mouth 2 (two) times daily.     Multiple Vitamin (MULTIVITAMIN WITH MINERALS) TABS tablet Take 1 tablet by mouth daily.     NIVOLUMAB IV Inject 360 mg into the vein every 21 ( twenty-one) days.     oxyCODONE (OXY IR/ROXICODONE) 5 MG immediate release tablet Take 1 tablet (5 mg total) by mouth every 4 (four) hours as needed for severe pain. 30 tablet 0   PACLITAXEL IV Inject 200 mg/m2 into the vein every 21 ( twenty-one) days.     prochlorperazine (COMPAZINE) 10 MG tablet Take 1 tablet (10 mg total) by mouth every 6 (six) hours as needed (Nausea or vomiting). (Patient not taking: Reported on 02/03/2021) 30 tablet 1   PROVENTIL HFA 108 (90 Base) MCG/ACT inhaler INHALE 2 PUFFS BY MOUTH EVERY 6 HOURS AS NEEDED FOR COUGHING, WHEEZING, OR SHORTNESS OF BREATH (Patient not taking: Reported on 02/03/2021) 20.1 g 1   sildenafil (VIAGRA) 50 MG tablet Take 50 mg by mouth daily as needed.     SYMBICORT 160-4.5 MCG/ACT inhaler Inhale 2 puffs into the lungs in the morning and at bedtime.     No current facility-administered medications for this visit.    ALLERGIES:  Allergies  Allergen Reactions   Other Nausea Only and Other (See Comments)    No spicy foods- Patient cannot  tolerate- HEARTBURN   Performance status (ECOG): 1 - Symptomatic but completely ambulatory  There were no vitals filed for this visit. Wt Readings from Last 3 Encounters:  02/03/21 157 lb 12.8 oz (71.6 kg)  01/21/21 159 lb 14.4 oz (72.5 kg)  01/19/21 161 lb (73 kg)   LABORATORY DATA:  I have reviewed the labs as listed.  CBC Latest Ref Rng & Units 02/03/2021 01/21/2021 01/13/2021  WBC 4.0 - 10.5 K/uL 7.9 3.6(L) 8.3  Hemoglobin 13.0 - 17.0 g/dL 10.7(L) 10.4(L) 11.1(L)  Hematocrit 39.0 - 52.0 % 33.5(L) 31.8(L) 34.8(L)  Platelets 150 - 400 K/uL 399 184 231   CMP Latest Ref Rng & Units 02/03/2021 01/21/2021 01/13/2021  Glucose 70 - 99 mg/dL 113(H) 104(H) 118(H)  BUN 6 - 20 mg/dL <5(L) 8 10  Creatinine 0.61 - 1.24 mg/dL 0.54(L) 0.65 0.59(L)  Sodium 135 - 145 mmol/L 136 130(L) 139  Potassium 3.5 - 5.1 mmol/L 3.7 3.6 3.4(L)  Chloride 98 - 111 mmol/L 103 97(L) 107  CO2 22 - 32 mmol/L '23 25 23  ' Calcium 8.9 - 10.3 mg/dL 8.5(L) 8.6(L) 8.9  Total Protein 6.5 - 8.1 g/dL 7.7 7.8 7.7  Total Bilirubin 0.3 - 1.2 mg/dL 0.3 0.1(L) 0.5  Alkaline Phos 38 - 126 U/L 210(H) 65 72  AST 15 - 41 U/L 82(H) 104(H) 56(H)  ALT 0 - 44 U/L 50(H) 39 33    DIAGNOSTIC IMAGING:  I have independently reviewed the scans and discussed with the patient. No results found.   ASSESSMENT:  1.  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 Halford Chessman for left upper lobe lung mass. - Is current active smoker, 1/2 pack/day for 35 years. - CT lung cancer screening scan on 10/16/2020 showed cavitary mass in the anterior left upper lobe measuring 4.4 x 3.0 x 3.5 cm with 2.5 cm solid nodular component inferomedially.  This abuts and involves the anterior pleura/chest wall.  Expansile lytic lesion in the right posterolateral ninth and 10th ribs (unchanged dating back to CT from 06/16/2016).  10 mm short axis AP window node suspicious for nodal  metastasis. - No change in baseline cough.  No  hemoptysis.  No chest pain. - Brain MRI was negative for metastatic disease. - PET scan on 12/14/2020 showed left upper lobe mass measuring 3.9 x 4 cm with SUV 17.1.  Some contiguous hypermetabolism along the left chest wall including anterolateral left third rib with SUV 5.4.  No gross osseous destruction.  AP window lymph node of 9 mm, mildly hypermetabolic, SUV 2.8. - On 2/95/7473 left upper lobe brushing and lavage consistent with squamous cell carcinoma. - Left upper lobe fine-needle aspiration was also consistent with squamous cell carcinoma.  Lymph node could not be reached. - Caris testing with limited tissue showed MMR proficient, BRAF V6 energy negative, PD-L1 negative by 22 C3, 28-8, SP142.  (IHC/CISH results should be interpreted with caution given the potential for false negative results) - 3 cycles of carboplatin, paclitaxel and nivolumab from 01/13/2021 through 02/24/2021 based on keynote-816.   2.  Social/family history: - Previously worked in [employer], currently on disability. - Current active smoker, 1/2 pack/day for 35 years. - Drinks 6 pack of beers along with 1 quart of liquor every day. - 1 brother had throat cancer and another brother had lung cancer.   PLAN:  1.  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 - He does not report any GI side effects.  No diarrhea or skin rashes reported. - Reviewed his labs today which showed normal LFTs and creatinine.  CBC shows normal white count and platelet count.  TSH was 1.5. - We will proceed with cycle 3 with dose reduction of paclitaxel. - He has an appointment to see Dr. Roxan Hockey around 03/05/2021. - I plan to obtain CT chest with contrast in 3 weeks prior to next visit.  This concludes neoadjuvant chemotherapy and he will proceed with surgical resection after today's treatment.   2.  Smoking cessation: - He has cut back but is smoking about half pack of  cigarettes per day. - I have counseled him to quit smoking completely in the next week or 2.  He will use nicotine patches.  3.  Joint pains: - He reports joint pains in ankles, knees, elbows and wrist.  No swelling noted. - He is taking oxycodone 5 mg 2 tablets every 6 hours. - This is likely related to immunotherapy. - We will start him on prednisone 5 mg daily and see if it helps.  4.  Neuropathy: - He reported constant numbness and tingling in the fingertips and toes started after cycle 1. - Cycle 2 dose of paclitaxel was dose reduced to 175 mg/m2. - We will start him on gabapentin 300 mg 3 times daily.   Orders placed this encounter:  No orders of the defined types were placed in this encounter.  I provided 30 minutes of non-face-to-face time during this encounter.  Derek Jack, MD [place] [phone]   I, Thana Ates, am acting as a scribe for Dr. Derek Jack.  I, Derek Jack MD, have reviewed the above documentation for accuracy and completeness, and I agree with the above.

## 2021-02-24 ENCOUNTER — Inpatient Hospital Stay (HOSPITAL_COMMUNITY): Payer: Medicare PPO | Attending: Hematology

## 2021-02-24 ENCOUNTER — Encounter (HOSPITAL_COMMUNITY): Payer: Self-pay | Admitting: Hematology

## 2021-02-24 ENCOUNTER — Inpatient Hospital Stay (HOSPITAL_COMMUNITY): Payer: Medicare PPO

## 2021-02-24 ENCOUNTER — Other Ambulatory Visit: Payer: Self-pay

## 2021-02-24 ENCOUNTER — Inpatient Hospital Stay (HOSPITAL_BASED_OUTPATIENT_CLINIC_OR_DEPARTMENT_OTHER): Payer: Medicare PPO | Admitting: Hematology

## 2021-02-24 VITALS — HR 111 | Temp 96.9°F | Resp 18 | Wt 158.6 lb

## 2021-02-24 VITALS — BP 150/73 | HR 98 | Temp 98.4°F | Resp 18

## 2021-02-24 DIAGNOSIS — F17210 Nicotine dependence, cigarettes, uncomplicated: Secondary | ICD-10-CM | POA: Insufficient documentation

## 2021-02-24 DIAGNOSIS — C3492 Malignant neoplasm of unspecified part of left bronchus or lung: Secondary | ICD-10-CM

## 2021-02-24 DIAGNOSIS — Z95828 Presence of other vascular implants and grafts: Secondary | ICD-10-CM

## 2021-02-24 DIAGNOSIS — I10 Essential (primary) hypertension: Secondary | ICD-10-CM | POA: Diagnosis not present

## 2021-02-24 DIAGNOSIS — Z7951 Long term (current) use of inhaled steroids: Secondary | ICD-10-CM | POA: Insufficient documentation

## 2021-02-24 DIAGNOSIS — Z5112 Encounter for antineoplastic immunotherapy: Secondary | ICD-10-CM | POA: Diagnosis not present

## 2021-02-24 DIAGNOSIS — Z79899 Other long term (current) drug therapy: Secondary | ICD-10-CM | POA: Diagnosis not present

## 2021-02-24 DIAGNOSIS — E785 Hyperlipidemia, unspecified: Secondary | ICD-10-CM | POA: Diagnosis not present

## 2021-02-24 DIAGNOSIS — Z7982 Long term (current) use of aspirin: Secondary | ICD-10-CM | POA: Insufficient documentation

## 2021-02-24 DIAGNOSIS — Z5111 Encounter for antineoplastic chemotherapy: Secondary | ICD-10-CM | POA: Insufficient documentation

## 2021-02-24 DIAGNOSIS — C3412 Malignant neoplasm of upper lobe, left bronchus or lung: Secondary | ICD-10-CM

## 2021-02-24 LAB — TSH: TSH: 1.516 u[IU]/mL (ref 0.350–4.500)

## 2021-02-24 LAB — COMPREHENSIVE METABOLIC PANEL
ALT: 18 U/L (ref 0–44)
AST: 34 U/L (ref 15–41)
Albumin: 3.7 g/dL (ref 3.5–5.0)
Alkaline Phosphatase: 92 U/L (ref 38–126)
Anion gap: 9 (ref 5–15)
BUN: 6 mg/dL (ref 6–20)
CO2: 27 mmol/L (ref 22–32)
Calcium: 9.1 mg/dL (ref 8.9–10.3)
Chloride: 103 mmol/L (ref 98–111)
Creatinine, Ser: 0.56 mg/dL — ABNORMAL LOW (ref 0.61–1.24)
GFR, Estimated: >60 mL/min (ref >60)
Glucose, Bld: 113 mg/dL — ABNORMAL HIGH (ref 70–99)
Potassium: 3.5 mmol/L (ref 3.5–5.1)
Sodium: 139 mmol/L (ref 135–145)
Total Bilirubin: 0.3 mg/dL (ref 0.3–1.2)
Total Protein: 7.5 g/dL (ref 6.5–8.1)

## 2021-02-24 LAB — CBC WITH DIFFERENTIAL/PLATELET
Abs Immature Granulocytes: 0.04 10*3/uL (ref 0.00–0.07)
Basophils Absolute: 0 10*3/uL (ref 0.0–0.1)
Basophils Relative: 1 %
Eosinophils Absolute: 0 10*3/uL (ref 0.0–0.5)
Eosinophils Relative: 1 %
HCT: 35.6 % — ABNORMAL LOW (ref 39.0–52.0)
Hemoglobin: 11.5 g/dL — ABNORMAL LOW (ref 13.0–17.0)
Immature Granulocytes: 1 %
Lymphocytes Relative: 42 %
Lymphs Abs: 2.8 10*3/uL (ref 0.7–4.0)
MCH: 29 pg (ref 26.0–34.0)
MCHC: 32.3 g/dL (ref 30.0–36.0)
MCV: 89.9 fL (ref 80.0–100.0)
Monocytes Absolute: 0.8 10*3/uL (ref 0.1–1.0)
Monocytes Relative: 12 %
Neutro Abs: 2.7 10*3/uL (ref 1.7–7.7)
Neutrophils Relative %: 43 %
Platelets: 209 10*3/uL (ref 150–400)
RBC: 3.96 MIL/uL — ABNORMAL LOW (ref 4.22–5.81)
RDW: 22.4 % — ABNORMAL HIGH (ref 11.5–15.5)
WBC: 6.1 10*3/uL (ref 4.0–10.5)
nRBC: 0 % (ref 0.0–0.2)

## 2021-02-24 LAB — MAGNESIUM: Magnesium: 2 mg/dL (ref 1.7–2.4)

## 2021-02-24 MED ORDER — SODIUM CHLORIDE 0.9 % IV SOLN
175.0000 mg/m2 | Freq: Once | INTRAVENOUS | Status: AC
  Administered 2021-02-24: 330 mg via INTRAVENOUS
  Filled 2021-02-24: qty 55

## 2021-02-24 MED ORDER — SODIUM CHLORIDE 0.9% FLUSH
10.0000 mL | INTRAVENOUS | Status: DC | PRN
  Administered 2021-02-24: 10 mL

## 2021-02-24 MED ORDER — DIPHENHYDRAMINE HCL 50 MG/ML IJ SOLN
25.0000 mg | Freq: Once | INTRAMUSCULAR | Status: AC
  Administered 2021-02-24: 25 mg via INTRAVENOUS

## 2021-02-24 MED ORDER — SODIUM CHLORIDE 0.9 % IV SOLN
10.0000 mg | Freq: Once | INTRAVENOUS | Status: AC
  Administered 2021-02-24: 10 mg via INTRAVENOUS
  Filled 2021-02-24: qty 10

## 2021-02-24 MED ORDER — DIPHENHYDRAMINE HCL 50 MG/ML IJ SOLN
INTRAMUSCULAR | Status: AC
  Filled 2021-02-24: qty 1

## 2021-02-24 MED ORDER — PALONOSETRON HCL INJECTION 0.25 MG/5ML
0.2500 mg | Freq: Once | INTRAVENOUS | Status: AC
  Administered 2021-02-24: 0.25 mg via INTRAVENOUS

## 2021-02-24 MED ORDER — SODIUM CHLORIDE 0.9 % IV SOLN: Freq: Once | INTRAVENOUS | Status: AC

## 2021-02-24 MED ORDER — PREDNISONE 5 MG PO TABS: 5.0000 mg | ORAL_TABLET | Freq: Every day | ORAL | 0 refills | Status: DC

## 2021-02-24 MED ORDER — SODIUM CHLORIDE 0.9 % IV SOLN
150.0000 mg | Freq: Once | INTRAVENOUS | Status: AC
  Administered 2021-02-24: 150 mg via INTRAVENOUS
  Filled 2021-02-24: qty 150

## 2021-02-24 MED ORDER — SODIUM CHLORIDE 0.9 % IV SOLN
360.0000 mg | Freq: Once | INTRAVENOUS | Status: AC
  Administered 2021-02-24: 360 mg via INTRAVENOUS
  Filled 2021-02-24: qty 12

## 2021-02-24 MED ORDER — FAMOTIDINE 20 MG IN NS 100 ML IVPB
20.0000 mg | Freq: Once | INTRAVENOUS | Status: AC
  Administered 2021-02-24: 20 mg via INTRAVENOUS
  Filled 2021-02-24: qty 20

## 2021-02-24 MED ORDER — SODIUM CHLORIDE 0.9 % IV SOLN
761.4000 mg | Freq: Once | INTRAVENOUS | Status: AC
  Administered 2021-02-24: 760 mg via INTRAVENOUS
  Filled 2021-02-24: qty 76

## 2021-02-24 MED ORDER — PALONOSETRON HCL INJECTION 0.25 MG/5ML
INTRAVENOUS | Status: AC
  Filled 2021-02-24: qty 5

## 2021-02-24 MED ORDER — HEPARIN SOD (PORK) LOCK FLUSH 100 UNIT/ML IV SOLN
500.0000 [IU] | Freq: Once | INTRAVENOUS | Status: AC | PRN
  Administered 2021-02-24: 500 [IU]

## 2021-02-24 NOTE — Patient Instructions (Addendum)
[place] at [facility] Discharge Instructions  You were seen today by Dr. Delton Coombes. He went over your recent results, and you received your treatment. You have been prescribed Prednisone to help with your joint pain: take one 5 mg tablet daily. You will be scheduled for a CT scan of your chest prior to your next visit. Dr. Delton Coombes will see you back in 3 weeks for labs and follow up.   Thank you for choosing [place] at [facility] to provide your oncology and hematology care.  To afford each patient quality time with our provider, please arrive at least 15 minutes before your scheduled appointment time.   If you have a lab appointment with the [facility] please come in thru the Main Entrance and check in at the main information desk  You need to re-schedule your appointment should you arrive 10 or more minutes late.  We strive to give you quality time with our providers, and arriving late affects you and other patients whose appointments are after yours.  Also, if you no show three or more times for appointments you may be dismissed from the clinic at the providers discretion.     Again, thank you for choosing [facility].  Our hope is that these requests will decrease the amount of time that you wait before being seen by our physicians.       _____________________________________________________________  Should you have questions after your visit to [facility], please contact our office at [phone] between the hours of 8:00 a.m. and 4:30 p.m.  Voicemails left after 4:00 p.m. will not be returned until the following business day.  For prescription refill requests, have your pharmacy contact our office and allow 72 hours.    Cancer Center Support Programs:   > Cancer Support Group  2nd Tuesday of the month 1pm-2pm, Journey Room

## 2021-02-24 NOTE — Progress Notes (Signed)
Patient presents today for treatment and a virtual follow up visit with Dr. Delton Coombes. Labs pending. Vital signs within parameters for today's treatment. MAR reviewed. Patient denies any changes since his last treatment. Patient states the numbness in his fingertips has not increased in severity per patient's words.   Message received from [facility] / Dr. Delton Coombes to proceed with treatment. Order received to infuse 539mls of Normal Saline over 1 hour.   Treatment given today per MD orders. Tolerated infusion without adverse affects. Vital signs stable. No complaints at this time. Discharged from clinic ambulatory in stable condition. Alert and oriented x 3. F/U with [facility] as scheduled.

## 2021-02-24 NOTE — Progress Notes (Signed)
Patient has been assessed, vital signs and labs have been reviewed by Dr. Katragadda. ANC, Creatinine, LFTs, and Platelets are within treatment parameters per Dr. Katragadda. The patient is good to proceed with treatment at this time. Primary RN and pharmacy aware.  

## 2021-02-24 NOTE — Patient Instructions (Signed)
[place]  Discharge Instructions: Thank you for choosing [facility] to provide your oncology and hematology care.  If you have a lab appointment with the [facility], please come in thru the Main Entrance and check in at the main information desk.  Wear comfortable clothing and clothing appropriate for easy access to any Portacath or PICC line.   We strive to give you quality time with your provider. You may need to reschedule your appointment if you arrive late (15 or more minutes).  Arriving late affects you and other patients whose appointments are after yours.  Also, if you miss three or more appointments without notifying the office, you may be dismissed from the clinic at the provider's discretion.      For prescription refill requests, have your pharmacy contact our office and allow 72 hours for refills to be completed.    Today you received the following chemotherapy and/or immunotherapy agents Opdivo, Taxol, Carboplatin.       To help prevent nausea and vomiting after your treatment, we encourage you to take your nausea medication as directed.  BELOW ARE SYMPTOMS THAT SHOULD BE REPORTED IMMEDIATELY: *FEVER GREATER THAN 100.4 F (38 C) OR HIGHER *CHILLS OR SWEATING *NAUSEA AND VOMITING THAT IS NOT CONTROLLED WITH YOUR NAUSEA MEDICATION *UNUSUAL SHORTNESS OF BREATH *UNUSUAL BRUISING OR BLEEDING *URINARY PROBLEMS (pain or burning when urinating, or frequent urination) *BOWEL PROBLEMS (unusual diarrhea, constipation, pain near the anus) TENDERNESS IN MOUTH AND THROAT WITH OR WITHOUT PRESENCE OF ULCERS (sore throat, sores in mouth, or a toothache) UNUSUAL RASH, SWELLING OR PAIN  UNUSUAL VAGINAL DISCHARGE OR ITCHING   Items with * indicate a potential emergency and should be followed up as soon as possible or go to the Emergency Department if any problems should occur.  Please show the CHEMOTHERAPY ALERT CARD or IMMUNOTHERAPY ALERT CARD at check-in to  the Emergency Department and triage nurse.  Should you have questions after your visit or need to cancel or reschedule your appointment, please contact [facility] [phone]  and follow the prompts.  Office hours are 8:00 a.m. to 4:30 p.m. Monday - Friday. Please note that voicemails left after 4:00 p.m. may not be returned until the following business day.  We are closed weekends and major holidays. You have access to a nurse at all times for urgent questions. Please call the main number to the clinic [phone] and follow the prompts.  For any non-urgent questions, you may also contact your provider using MyChart. We now offer e-Visits for anyone 32 and older to request care online for non-urgent symptoms. For details visit [email or web address].   Also download the MyChart app! Go to the app store, search "MyChart", open the app, select [facility], and log in with your MyChart username and password.  Due to Covid, a mask is required upon entering the hospital/clinic. If you do not have a mask, one will be given to you upon arrival. For doctor visits, patients may have 1 support person aged 52 or older with them. For treatment visits, patients cannot have anyone with them due to current Covid guidelines and our immunocompromised population.

## 2021-02-25 ENCOUNTER — Encounter (HOSPITAL_COMMUNITY): Payer: Self-pay

## 2021-03-01 ENCOUNTER — Other Ambulatory Visit (HOSPITAL_COMMUNITY): Payer: Self-pay | Admitting: Surgery

## 2021-03-01 ENCOUNTER — Other Ambulatory Visit (HOSPITAL_COMMUNITY): Payer: Self-pay | Admitting: *Deleted

## 2021-03-01 MED ORDER — OXYCODONE HCL 5 MG PO TABS: 10.0000 mg | ORAL_TABLET | ORAL | 0 refills | Status: DC | PRN

## 2021-03-08 ENCOUNTER — Encounter: Payer: Self-pay | Admitting: Pulmonary Disease

## 2021-03-08 ENCOUNTER — Other Ambulatory Visit: Payer: Self-pay

## 2021-03-08 ENCOUNTER — Ambulatory Visit (INDEPENDENT_AMBULATORY_CARE_PROVIDER_SITE_OTHER): Payer: Medicare PPO | Admitting: Pulmonary Disease

## 2021-03-08 VITALS — BP 100/52 | HR 115 | Temp 97.9°F | Ht 69.0 in | Wt 156.0 lb

## 2021-03-08 DIAGNOSIS — Z720 Tobacco use: Secondary | ICD-10-CM | POA: Diagnosis not present

## 2021-03-08 DIAGNOSIS — J449 Chronic obstructive pulmonary disease, unspecified: Secondary | ICD-10-CM | POA: Diagnosis not present

## 2021-03-08 NOTE — Progress Notes (Signed)
CARDIOLOGY CONSULT NOTE       Patient ID: Bryan Fleming MRN: [id number] DOB/AGE: [birth year] 60 y.o.  Admit date: (Not on file) Referring Physician: [name] Primary Physician: Rosita Fire, MD Primary Cardiologist: New Reason for Consultation: Pre operative   Active Problems:   * No active hospital problems. *   HPI:  60 y.o. referred by Dr Legrand Rams for preoperative evaluation. History of smoking with COPD and NSCLC cancer diagnosed May 2022 This is followed by Dr Roxan Hockey Pulmonary He has had chemo and is to see CVTS ? For surgery He has a 37 pack year history of smoking and drinks too much ETOH weekly . He does not want to use Chantix to quit and Wellbutrin ineffective  Trying nicotine patch  He has a right access port placed 01/12/21 CT shows enlarging cavitary mass in left upper lobe 4.3 cm x 6.8 cm with probably metastatic lymphadenopathy He had LM and 3 vessel coronary calcium and calcification of his AV.   He use to work in [employer] fund[employer] Currently on disability Still smoking 1/2 ppd  And drinks a 5 pack beer and quart of liquor daily   Likely stage 3A disease and needing neoadjuvant chemo and smoking cessation Before Dr Roxan Hockey would even consider surgery He would only be a candidate for lobectomy and not pneumonectomy   Moderate functional impairment due to cancer with exertional dyspnea Able to due 5 METS No chest pain despite calcium noted on CT   ROS All other systems reviewed and negative except as noted above  Past Medical History:  Diagnosis Date   COPD (chronic obstructive pulmonary disease) ([place]) 2007   EMPHYSEMA   Depression    Dyspnea    occasional - no oxygen   GERD (gastroesophageal reflux disease) 2010   Hepatitis    Alcoholic    HLD (hyperlipidemia)    no meds   Hypertension    Port-A-Cath in place 01/01/2021    Family History  Problem Relation Age of Onset   Hypertension Mother    Asthma Father    Diabetes Sister    Hypertension Sister     Hypertension Brother    Hypertension Sister    Lupus Daughter     Social History   Socioeconomic History   Marital status: Widowed    Spouse name: Not on file   Number of children: Not on file   Years of education: Not on file   Highest education level: Not on file  Occupational History   Not on file  Tobacco Use   Smoking status: Every Day    Packs/day: 1.00    Years: 37.00    Pack years: 37.00    Types: Cigarettes   Smokeless tobacco: Never   Tobacco comments:    smokes 1/2 pack a day [facility] 03/08/21  Vaping Use   Vaping Use: Never used  Substance and Sexual Activity   Alcohol use: Yes    Alcohol/week: 22.0 standard drinks    Types: 21 Cans of beer, 1 Standard drinks or equivalent per week    Comment: daily   Drug use: No   Sexual activity: Yes  Other Topics Concern   Not on file  Social History Narrative   Not on file   Social Determinants of Health   Financial Resource Strain: Not on file  Food Insecurity: Not on file  Transportation Needs: No Transportation Needs   Lack of Transportation (Medical): No   Lack of Transportation (Non-Medical): No  Physical Activity: Inactive  Days of Exercise per Week: 0 days   Minutes of Exercise per Session: 0 min  Stress: Not on file  Social Connections: Not on file  Intimate Partner Violence: Not At Risk   Fear of Current or Ex-Partner: No   Emotionally Abused: No   Physically Abused: No   Sexually Abused: No    Past Surgical History:  Procedure Laterality Date   BRONCHIAL BIOPSY  12/23/2020   Procedure: BRONCHIAL BIOPSIES;  Surgeon: [name], MD;  Location: [facility] ENDOSCOPY;  Service: Pulmonary;;   BRONCHIAL BRUSHINGS  12/23/2020   Procedure: BRONCHIAL BRUSHINGS;  Surgeon: [name], MD;  Location: [facility] ENDOSCOPY;  Service: Pulmonary;;   BRONCHIAL NEEDLE ASPIRATION BIOPSY  12/23/2020   Procedure: BRONCHIAL NEEDLE ASPIRATION BIOPSIES;  Surgeon: [name], MD;  Location: [place];  Service: Pulmonary;;    BRONCHIAL WASHINGS  12/23/2020   Procedure: BRONCHIAL WASHINGS;  Surgeon: [name], MD;  Location: [facility] ENDOSCOPY;  Service: Pulmonary;;   CARDIAC CATHETERIZATION  2010   COLONOSCOPY  03/14/2012   Procedure: COLONOSCOPY;  Surgeon: Rogene Houston, MD;  Location: [facility];  Service: Endoscopy;  Laterality: N/A;  100   IR IMAGING GUIDED PORT INSERTION  01/12/2021   VIDEO BRONCHOSCOPY WITH ENDOBRONCHIAL NAVIGATION N/A 12/23/2020   Procedure: VIDEO BRONCHOSCOPY WITH ENDOBRONCHIAL NAVIGATION;  Surgeon: [name], MD;  Location: [place] ENDOSCOPY;  Service: Pulmonary;  Laterality: N/A;   WRIST GANGLION EXCISION  1985      Current Outpatient Medications:    amLODipine (NORVASC) 5 MG tablet, Take 5 mg by mouth daily., Disp: , Rfl:    aspirin 81 MG tablet, Take 81 mg by mouth daily., Disp: , Rfl:    CARBOPLATIN IV, Inject into the vein every 21 ( twenty-one) days., Disp: , Rfl:    Fluticasone-Umeclidin-Vilant (TRELEGY ELLIPTA) 100-62.5-25 MCG/INH AEPB, Inhale 1 puff into the lungs daily in the afternoon., Disp: , Rfl:    gabapentin (NEURONTIN) 300 MG capsule, Take 1 capsule (300 mg total) by mouth 3 (three) times daily., Disp: 90 capsule, Rfl: 3   lidocaine-prilocaine (EMLA) cream, Apply small amount to port a cath site and cover with plastic wrap 1 hour prior to infusion appointments, Disp: 30 g, Rfl: 3   metoprolol tartrate (LOPRESSOR) 25 MG tablet, Take 25 mg by mouth 2 (two) times daily., Disp: , Rfl:    Multiple Vitamin (MULTIVITAMIN WITH MINERALS) TABS tablet, Take 1 tablet by mouth daily., Disp: , Rfl:    NIVOLUMAB IV, Inject 360 mg into the vein every 21 ( twenty-one) days., Disp: , Rfl:    oxyCODONE (OXY IR/ROXICODONE) 5 MG immediate release tablet, Take 2 tablets (10 mg total) by mouth every 4 (four) hours as needed for severe pain., Disp: 30 tablet, Rfl: 0   PACLITAXEL IV, Inject 200 mg/m2 into the vein every 21 ( twenty-one) days., Disp: , Rfl:    predniSONE (DELTASONE) 5 MG  tablet, Take 1 tablet (5 mg total) by mouth daily with breakfast., Disp: 30 tablet, Rfl: 0   prochlorperazine (COMPAZINE) 10 MG tablet, Take 1 tablet (10 mg total) by mouth every 6 (six) hours as needed (Nausea or vomiting)., Disp: 30 tablet, Rfl: 1   PROVENTIL HFA 108 (90 Base) MCG/ACT inhaler, INHALE 2 PUFFS BY MOUTH EVERY 6 HOURS AS NEEDED FOR COUGHING, WHEEZING, OR SHORTNESS OF BREATH, Disp: 20.1 g, Rfl: 1   sildenafil (VIAGRA) 50 MG tablet, Take 50 mg by mouth daily as needed., Disp: , Rfl:  Physical Exam: There were no vitals taken for this visit.    Affect appropriate Chronically ill male  HEENT: normal Neck supple with no adenopathy JVP normal no bruits no thyromegaly Lungs COPD exp wheezing decreased BS LUL Heart:  S1/S2 SEM  murmur, no rub, gallop or click PMI normal Access prot right chest  Abdomen: benighn, BS positve, no tenderness, no AAA no bruit.  No HSM or HJR Distal pulses intact with no bruits No edema Neuro non-focal Skin warm and dry No muscular weakness   Labs:   Lab Results  Component Value Date   WBC 6.1 02/24/2021   HGB 11.5 (L) 02/24/2021   HCT 35.6 (L) 02/24/2021   MCV 89.9 02/24/2021   PLT 209 02/24/2021   No results for input(s): NA, K, CL, CO2, BUN, CREATININE, CALCIUM, PROT, BILITOT, ALKPHOS, ALT, AST, GLUCOSE in the last 168 hours.  Invalid input(s): LABALBU Lab Results  Component Value Date   CKTOTAL 158 03/12/2007   CKMB 1.2 03/12/2007   TROPONINI <0.03 12/16/2017    Lab Results  Component Value Date   CHOL 165 06/05/2020   CHOL 196 09/03/2019   CHOL 174 08/20/2015   Lab Results  Component Value Date   HDL 59 06/05/2020   HDL 63 09/03/2019   HDL 59 08/20/2015   Lab Results  Component Value Date   LDLCALC 84 06/05/2020   LDLCALC 110 (H) 09/03/2019   LDLCALC 90 08/20/2015   Lab Results  Component Value Date   TRIG 111 06/05/2020   TRIG 114 09/03/2019   TRIG 126 08/20/2015   Lab Results  Component Value Date    CHOLHDL 2.8 06/05/2020   CHOLHDL 3.1 09/03/2019   CHOLHDL 2.9 08/20/2015   No results found for: LDLDIRECT    Radiology: No results found.  EKG: 06/05/20 SR rate 103 normal    ASSESSMENT AND PLAN:   Preoperative:  Stage 3A lung cancer LUL with poor lifestyle habits including ongoing smoking and ETOH abuse CT chest showing LM and 3 vessel coronary calcification. Marginal candidate for surgery as it is will risk stratify with exercise  myovue He was able to walk fairly well in clinic today would used modified Bruce protocol or [place]. Would like to avoid lexiscan with active wheezing and cancer    Echo for EF given ongoing chemotherapy  Lung CA:  f/u oncology and CVTS continue cycle 3 of paclitaxel, Nivolumab and carboplatin  and rei-imaging Rx complicated by joint pain and neuropathy on neurontin   EX Myovue Echo   F/U PRN   Signed: Jenkins Rouge 03/08/2021, 5:35 PM

## 2021-03-08 NOTE — Patient Instructions (Signed)
Follow up in 6 months 

## 2021-03-08 NOTE — Progress Notes (Signed)
Bryan Fleming, Critical Care, and Sleep Medicine  Chief Complaint  Patient presents with   Follow-up    Patient feels good overall no concerns at this time. Finished chemo last wednesday     Constitutional:  BP (!) 100/52 (BP Location: Left Arm, Patient Position: Sitting, Cuff Size: Normal)   Pulse (!) 115   Temp 97.9 F (36.6 C) (Oral)   Ht 5\' 9"  (1.753 m)   Wt 156 lb (70.8 kg)   SpO2 96%   BMI 23.04 kg/m   Past Medical History:  GERD, Alcoholic hepatitis, HTN  Past Surgical History:  He  has a past surgical history that includes Wrist ganglion excision (1985); Colonoscopy (03/14/2012); Cardiac catheterization (2010); Video bronchoscopy with endobronchial navigation (N/A, 12/23/2020); Bronchial brushings (12/23/2020); Bronchial needle aspiration biopsy (12/23/2020); Bronchial biopsy (12/23/2020); Bronchial washings (12/23/2020); and IR IMAGING GUIDED PORT INSERTION (01/12/2021).  Brief Summary:  Bryan Fleming is a 60 y.o. 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      Subjective:   He had bronchoscopy with Dr. Lamonte Sakai on 12/23/20.  Found to have squamous cell.  Treated with chemo.  Has appointment with thoracic surgery later this month.  Down to 1/2 ppd.  Bupropion didn't help.  Has nicotine patch.  Switched to trelegy.  This works better than symbicort.  Not needing albuterol much.  Has occasional cough with clear sputum.  Keeps up with activities a steady pace.  Physical Exam:   Appearance - well kempt   ENMT - no sinus tenderness, no oral exudate, no LAN, Mallampati 3 airway, no stridor  Respiratory - equal breath sounds bilaterally, no wheezing or rales  CV - s1s2 regular rate and rhythm, no murmurs  Ext - no clubbing, no edema  Skin - no rashes  Psych - normal mood and affect    Fleming testing:  PFT 12/08/20 >> FEV1 1.72 (56%), FEV1% 57, TLC 9.22 (135%), DLCO 41%  Chest Imaging:  LDCT chest 10/18/20 >> mass anterior  LUL 4.4 x 3 x 3.5 cm abuts chest wall, moderate centrilobular and paraseptal emphysema  Social History:  He  reports that he has been smoking cigarettes. He has a 37.00 pack-year smoking history. He has never used smokeless tobacco. He reports current alcohol use of about 22.0 standard drinks of alcohol per week. He reports that he does not use drugs.  Family History:  His family history includes Asthma in his father; Diabetes in his sister; Hypertension in his brother, mother, sister, and sister; Lupus in his daughter.     Assessment/Plan:   COPD with centrilobular emphysema. 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 - followed by Dr. Delton Coombes with [place] - has appointment Dr. Modesto Charon with thoracic surgery later this month  Tobacco abuse. - he would not want to use chantix - bupropion was ineffective - continue nicotine patch - discussed setting a quit date  Time Spent Involved in Patient Care on Day of Examination:  32 minutes  Follow up:   Patient Instructions  Follow up in 6 months  Medication List:   Allergies as of 03/08/2021       Reactions   Other Nausea Only, Other (See Comments)   No spicy foods- Patient cannot tolerate- HEARTBURN        Medication List        Accurate as of March 08, 2021 10:47 AM. If you have any questions, ask your  nurse or doctor.          STOP taking these medications    Symbicort 160-4.5 MCG/ACT inhaler Generic drug: budesonide-formoterol Stopped by: Chesley Mires, MD       TAKE these medications    amLODipine 5 MG tablet Commonly known as: NORVASC Take 5 mg by mouth daily.   aspirin 81 MG tablet Take 81 mg by mouth daily.   CARBOPLATIN IV Inject into the vein every 21 ( twenty-one) days.   gabapentin 300 MG capsule Commonly known as: NEURONTIN Take 1 capsule (300 mg total) by mouth 3 (three) times daily.   lidocaine-prilocaine cream Commonly known  as: EMLA Apply small amount to port a cath site and cover with plastic wrap 1 hour prior to infusion appointments   metoprolol tartrate 25 MG tablet Commonly known as: LOPRESSOR Take 25 mg by mouth 2 (two) times daily.   multivitamin with minerals Tabs tablet Take 1 tablet by mouth daily.   NIVOLUMAB IV Inject 360 mg into the vein every 21 ( twenty-one) days.   oxyCODONE 5 MG immediate release tablet Commonly known as: Oxy IR/ROXICODONE Take 2 tablets (10 mg total) by mouth every 4 (four) hours as needed for severe pain.   PACLITAXEL IV Inject 200 mg/m2 into the vein every 21 ( twenty-one) days.   predniSONE 5 MG tablet Commonly known as: DELTASONE Take 1 tablet (5 mg total) by mouth daily with breakfast.   prochlorperazine 10 MG tablet Commonly known as: COMPAZINE Take 1 tablet (10 mg total) by mouth every 6 (six) hours as needed (Nausea or vomiting).   Proventil HFA 108 (90 Base) MCG/ACT inhaler Generic drug: albuterol INHALE 2 PUFFS BY MOUTH EVERY 6 HOURS AS NEEDED FOR COUGHING, WHEEZING, OR SHORTNESS OF BREATH   sildenafil 50 MG tablet Commonly known as: VIAGRA Take 50 mg by mouth daily as needed.   Trelegy Ellipta 100-62.5-25 MCG/INH Aepb Generic drug: Fluticasone-Umeclidin-Vilant Inhale 1 puff into the lungs daily in the afternoon.        Signature:  Chesley Mires, MD [place] Pager - [phone] 03/08/2021, 10:47 AM

## 2021-03-09 ENCOUNTER — Other Ambulatory Visit (HOSPITAL_COMMUNITY): Payer: Self-pay | Admitting: Surgery

## 2021-03-09 DIAGNOSIS — C3492 Malignant neoplasm of unspecified part of left bronchus or lung: Secondary | ICD-10-CM

## 2021-03-09 MED ORDER — OXYCODONE HCL 5 MG PO TABS: 10.0000 mg | ORAL_TABLET | ORAL | 0 refills | Status: DC | PRN

## 2021-03-15 ENCOUNTER — Ambulatory Visit (HOSPITAL_COMMUNITY)
Admission: RE | Admit: 2021-03-15 | Discharge: 2021-03-15 | Disposition: A | Discharge status: Home / Self Care | Payer: Medicare PPO | Source: Ambulatory Visit | Attending: Hematology | Admitting: Hematology

## 2021-03-15 ENCOUNTER — Ambulatory Visit (INDEPENDENT_AMBULATORY_CARE_PROVIDER_SITE_OTHER): Payer: Medicare PPO | Admitting: Cardiovascular Disease

## 2021-03-15 ENCOUNTER — Encounter: Payer: Self-pay | Admitting: Cardiovascular Disease

## 2021-03-15 ENCOUNTER — Other Ambulatory Visit: Payer: Self-pay

## 2021-03-15 VITALS — BP 142/58 | HR 106 | Ht 69.0 in | Wt 160.0 lb

## 2021-03-15 DIAGNOSIS — I350 Nonrheumatic aortic (valve) stenosis: Secondary | ICD-10-CM | POA: Diagnosis not present

## 2021-03-15 DIAGNOSIS — C3492 Malignant neoplasm of unspecified part of left bronchus or lung: Secondary | ICD-10-CM | POA: Diagnosis not present

## 2021-03-15 DIAGNOSIS — C3412 Malignant neoplasm of upper lobe, left bronchus or lung: Secondary | ICD-10-CM | POA: Diagnosis not present

## 2021-03-15 DIAGNOSIS — J9811 Atelectasis: Secondary | ICD-10-CM | POA: Diagnosis not present

## 2021-03-15 DIAGNOSIS — Z01810 Encounter for preprocedural cardiovascular examination: Secondary | ICD-10-CM

## 2021-03-15 DIAGNOSIS — I700 Atherosclerosis of aorta: Secondary | ICD-10-CM | POA: Diagnosis not present

## 2021-03-15 DIAGNOSIS — Z01818 Encounter for other preprocedural examination: Secondary | ICD-10-CM

## 2021-03-15 DIAGNOSIS — J439 Emphysema, unspecified: Secondary | ICD-10-CM | POA: Diagnosis not present

## 2021-03-15 IMAGING — CT CT CHEST W/ CM
2 of 3 series · 14 of 36 positions shown, 17 images · IV contrast (Omnipaque or Isovue)
Comparison: CT chest dated [DATE]
COMPARISON: CT chest dated [DATE]

Addendum:
CLINICAL DATA: Primary squamous cell carcinoma left lung

EXAM:
CT CHEST WITH CONTRAST
TECHNIQUE: Multidetector CT imaging of the chest was performed during
intravenous contrast administration.
CONTRAST:  75mL OMNIPAQUE IOHEXOL 300 MG/ML  SOLN

[Series 2: routine chest with · axial · 0.70mm/px · z∈[+1455,+1731]mm · 11 of 164 slices shown, 14 images]
[im 13/164  mediastinal]
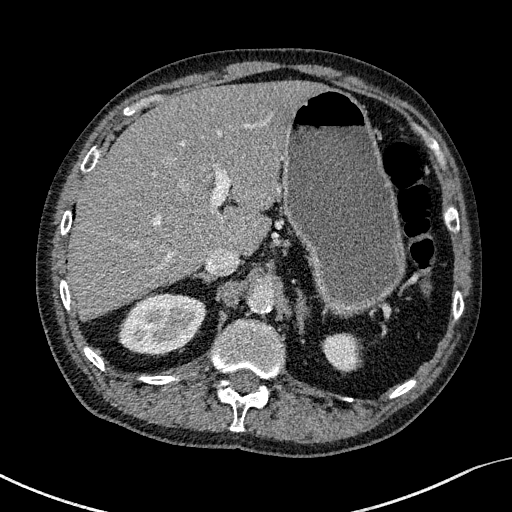
[im 13/164  lung]
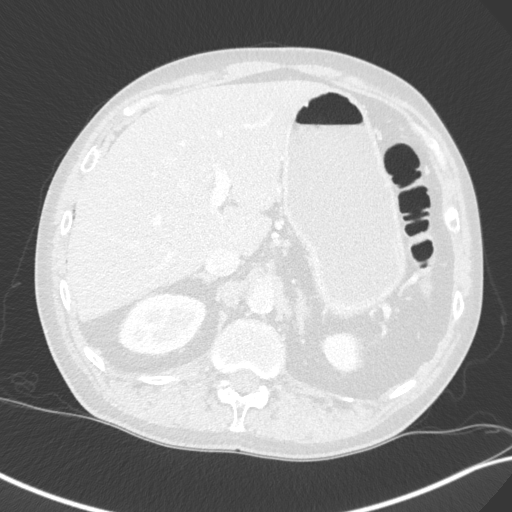
[im 25/164  lung]
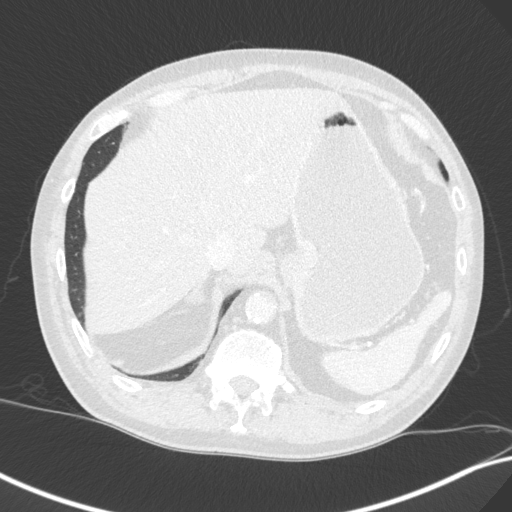
[im 37/164  lung]
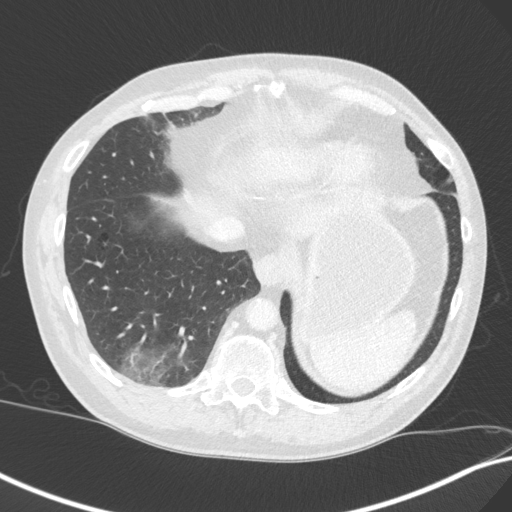
[im 55/164  lung]
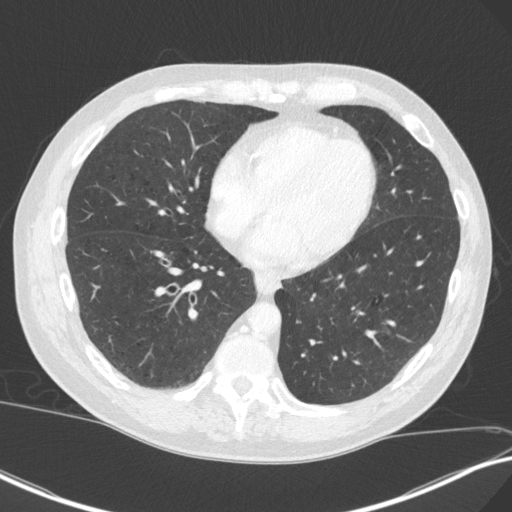
[im 67/164  mediastinal]
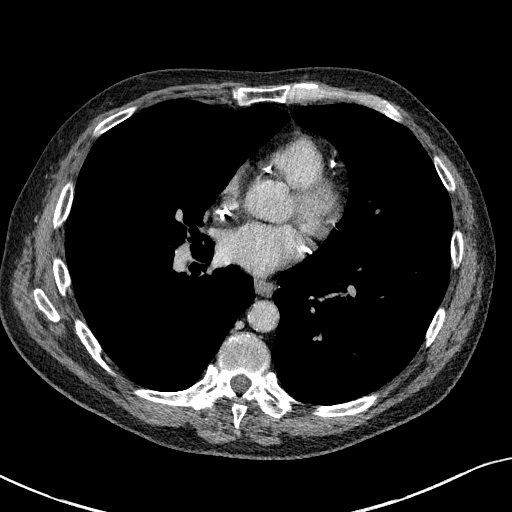
[im 67/164  lung]
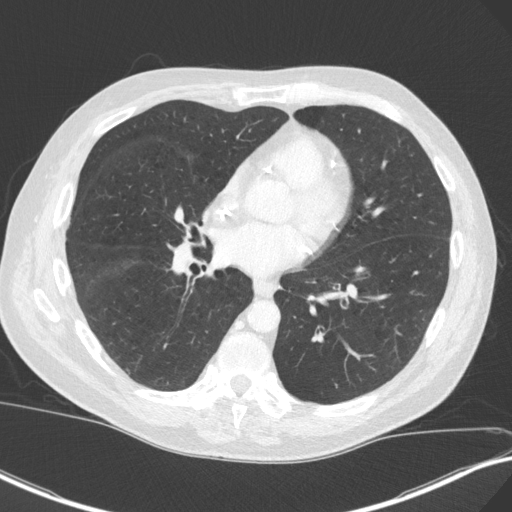
[im 85/164  lung]
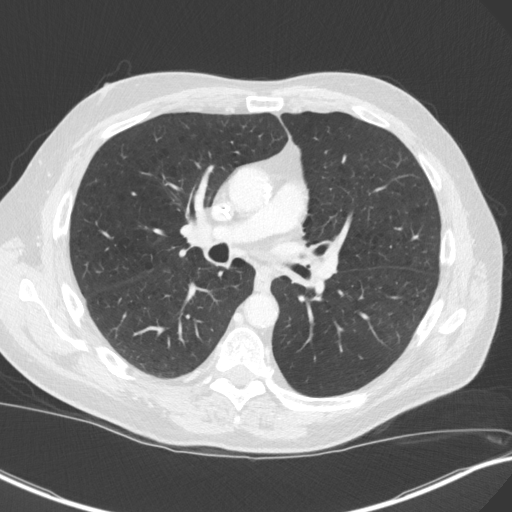
[im 97/164  lung]
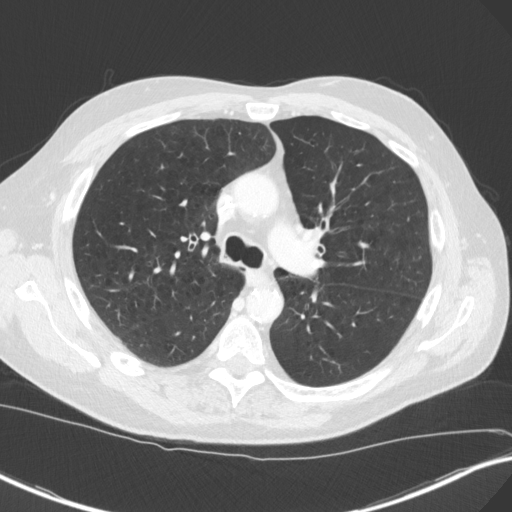
[im 109/164  lung]
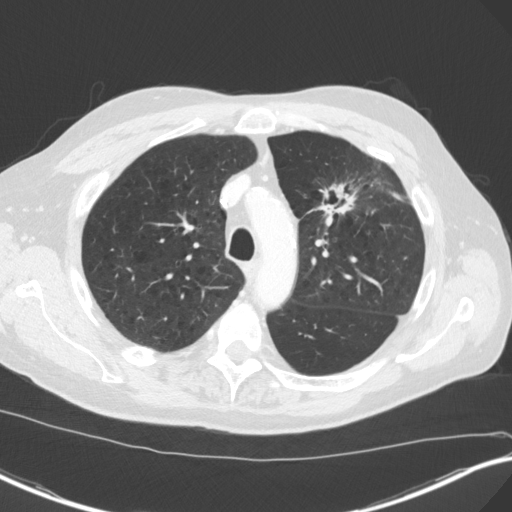
[im 127/164  mediastinal]
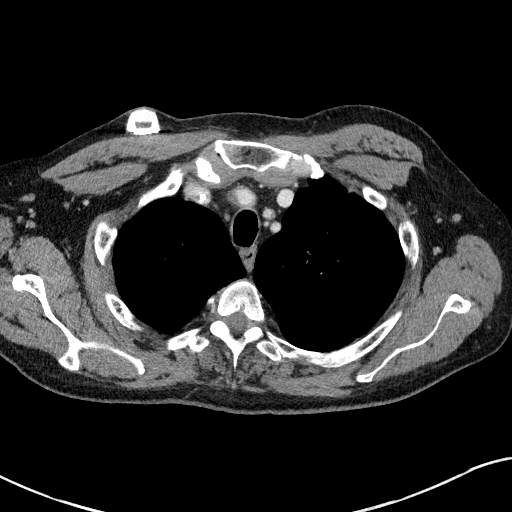
[im 127/164  lung]
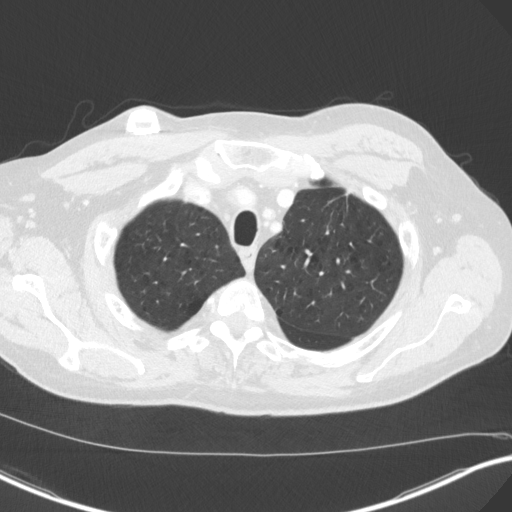
[im 139/164  lung]
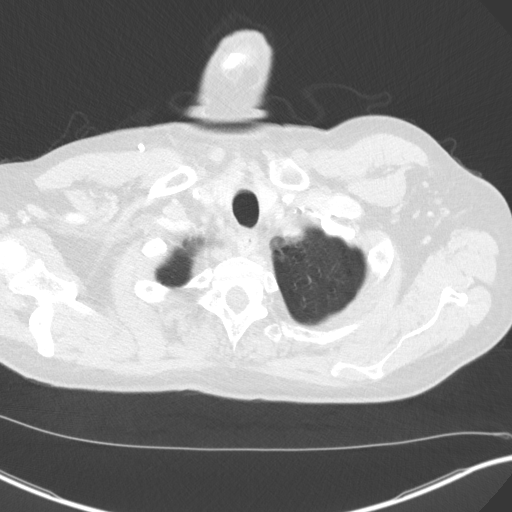
[im 151/164  lung]
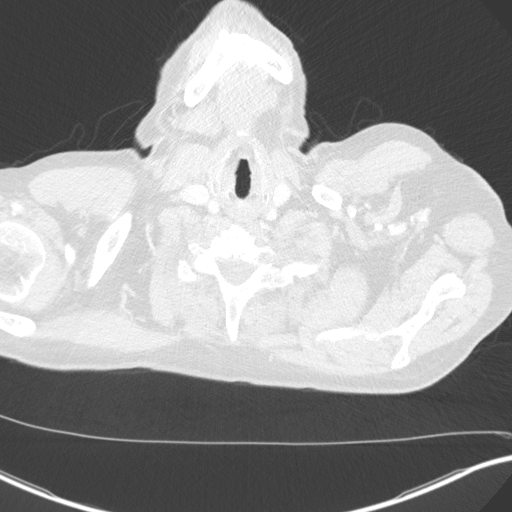

[Series 5: coronal · coronal · 0.69mm/px · 3 of 154 slices shown]
[im 31/154  lung]
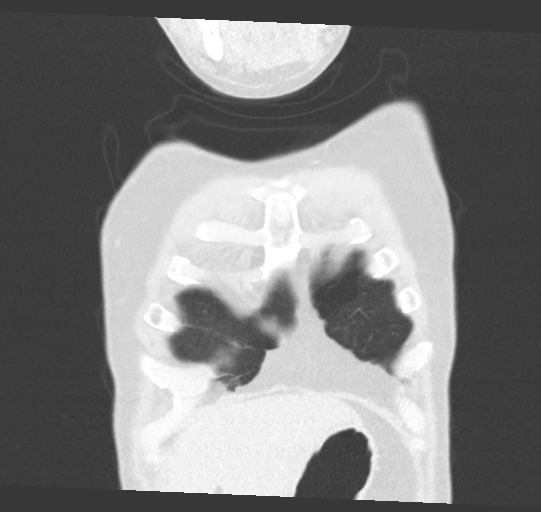
[im 62/154  lung]
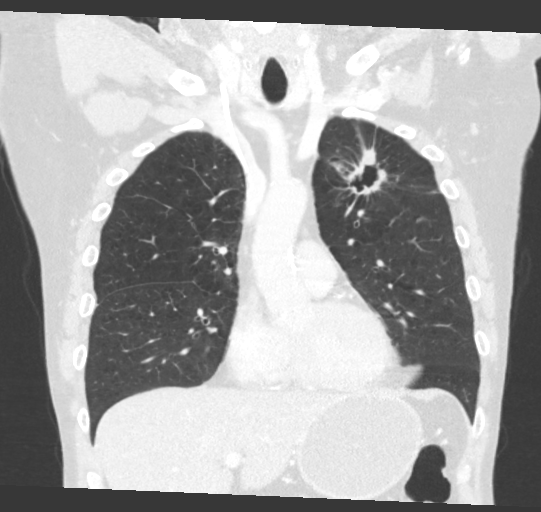
[im 92/154  lung]
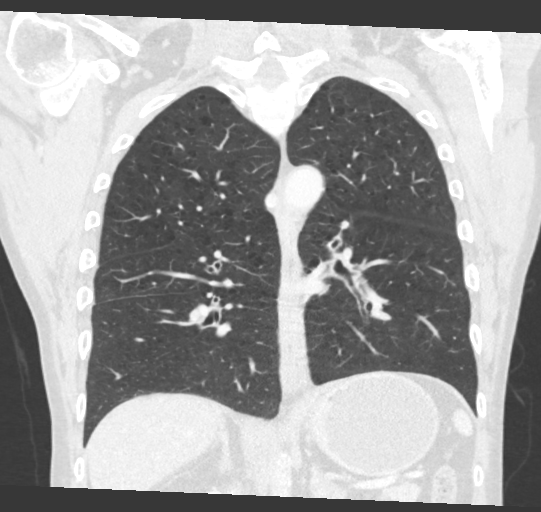

[14 of 36 positions shown; findings below may reference images not displayed]

FINDINGS: Cardiovascular: Normal heart size. No pericardial effusion.
Three-vessel coronary artery calcifications. Calcifications of the
aortic valve. Atherosclerotic disease of the thoracic aorta. Right
chest wall port with tip in the upper right atrium.

Mediastinum/Nodes: Interval decreased size of AP window lymph node
measuring up to 6 mm in short axis on series 2, image 64, previously
measured up to 9 mm. Esophagus and thyroid are unremarkable.

Lungs/Pleura: Debris noted in the right mainstem bronchus. Bilateral
centrilobular emphysema. Spiculated right upper lobe mass with
central cavitation, decreased in size compared to prior exam
measuring 4.7 x 2.6 cm, previously measured 5.0 x 3.2 cm when
measured in similar plane. Interval resolution of previously seen
air-fluid level within the area of cavitation. No new or enlarging
pulmonary nodules. Right lower lobe atelectasis.

Upper Abdomen: No acute abnormality.

Musculoskeletal: Old posterior right-sided rib fractures. No
aggressive appearing osseous lesions
IMPRESSION: Decreased size of left upper lobe cavitary mass.

Decreased size of AP window lymph node.

Three-vessel coronary artery calcifications and calcifications of
the aortic valve.

Aortic Atherosclerosis ([SH]-[SH]) and Emphysema ([SH]-[SH]).

ADDENDUM:
Original report was generated with an error in the finding section,
corrected as follows: Spiculated LEFT upper lobe mass with central
cavitation.

*** End of Addendum ***
FINDINGS: Cardiovascular: Normal heart size. No pericardial effusion.
Three-vessel coronary artery calcifications. Calcifications of the
aortic valve. Atherosclerotic disease of the thoracic aorta. Right
chest wall port with tip in the upper right atrium.

Mediastinum/Nodes: Interval decreased size of AP window lymph node
measuring up to 6 mm in short axis on series 2, image 64, previously
measured up to 9 mm. Esophagus and thyroid are unremarkable.

Lungs/Pleura: Debris noted in the right mainstem bronchus. Bilateral
centrilobular emphysema. Spiculated right upper lobe mass with
central cavitation, decreased in size compared to prior exam
measuring 4.7 x 2.6 cm, previously measured 5.0 x 3.2 cm when
measured in similar plane. Interval resolution of previously seen
air-fluid level within the area of cavitation. No new or enlarging
pulmonary nodules. Right lower lobe atelectasis.

Upper Abdomen: No acute abnormality.

Musculoskeletal: Old posterior right-sided rib fractures. No
aggressive appearing osseous lesions
IMPRESSION: Decreased size of left upper lobe cavitary mass.

Decreased size of AP window lymph node.

Three-vessel coronary artery calcifications and calcifications of
the aortic valve.

Aortic Atherosclerosis ([SH]-[SH]) and Emphysema ([SH]-[SH]).

## 2021-03-15 MED ORDER — IOHEXOL 300 MG/ML  SOLN
75.0000 mL | Freq: Once | INTRAMUSCULAR | Status: AC | PRN
  Administered 2021-03-15: 75 mL via INTRAVENOUS

## 2021-03-15 MED ORDER — IOHEXOL 350 MG/ML SOLN: 75.0000 mL | Freq: Once | INTRAVENOUS | Status: DC | PRN

## 2021-03-15 NOTE — Patient Instructions (Signed)
Medication Instructions:  Your physician recommends that you continue on your current medications as directed. Please refer to the Current Medication list given to you today.  *If you need a refill on your cardiac medications before your next appointment, please call your pharmacy*   Lab Work: NONE   If you have labs (blood work) drawn today and your tests are completely normal, you will receive your results only by: . MyChart Message (if you have MyChart) OR . A paper copy in the mail If you have any lab test that is abnormal or we need to change your treatment, we will call you to review the results.   Testing/Procedures: Your physician has requested that you have an echocardiogram. Echocardiography is a painless test that uses sound waves to create images of your heart. It provides your doctor with information about the size and shape of your heart and how well your heart's chambers and valves are working. This procedure takes approximately one hour. There are no restrictions for this procedure.  Your physician has requested that you have en exercise stress myoview. For further information please visit [email or web address]. Please follow instruction sheet, as given.  Follow-Up: At [facility], you and your health needs are our priority.  As part of our continuing mission to provide you with exceptional heart care, we have created designated Provider Care Teams.  These Care Teams include your primary Cardiologist (physician) and Advanced Practice Providers (APPs -  Physician Assistants and Nurse Practitioners) who all work together to provide you with the care you need, when you need it.  We recommend signing up for the patient portal called "MyChart".  Sign up information is provided on this After Visit Summary.  MyChart is used to connect with patients for Virtual Visits (Telemedicine).  Patients are able to view lab/test results, encounter notes, upcoming appointments, etc.  Non-urgent  messages can be sent to your provider as well.   To learn more about what you can do with MyChart, go to [email or web address].    Your next appointment:    As Needed   The format for your next appointment:   In Person  Provider:   Peter Nishan, MD   Other Instructions Thank you for choosing [facility]!    

## 2021-03-16 ENCOUNTER — Encounter: Payer: Self-pay | Admitting: Thoracic Surgery (Cardiothoracic Vascular Surgery)

## 2021-03-16 ENCOUNTER — Ambulatory Visit (INDEPENDENT_AMBULATORY_CARE_PROVIDER_SITE_OTHER): Payer: Medicare PPO | Admitting: Thoracic Surgery (Cardiothoracic Vascular Surgery)

## 2021-03-16 VITALS — BP 154/62 | HR 136 | Resp 20 | Ht 69.0 in | Wt 160.0 lb

## 2021-03-16 DIAGNOSIS — J984 Other disorders of lung: Secondary | ICD-10-CM | POA: Diagnosis not present

## 2021-03-16 NOTE — Progress Notes (Signed)
[address]       [place] [address]             [phone]      HPI: Mr. Morgan returns for a scheduled follow-up visit  Bradshaw Minihan is a 60 year old man with a history of tobacco abuse, COPD, ethanol abuse, alcoholic pancreatitis, alcoholic hepatitis, hypertension, hyperlipidemia, reflux, and depression.  He has about a 50-pack-year history of smoking.  He says he is currently smoking less than a pack of cigarettes a day.  He was found to have a left upper lobe lung mass on a low-dose screening CT.  PET/CT showed the mass was hypermetabolic.  There also was a suspicious AP window lymph node and some questionable activity in the chest wall.  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  The lymph node was not approachable with endobronchial ultrasound.  Clinical stage of the squamous cell carcinoma is 3A (T2b, N2).  He saw Dr. Delton Coombes and is being treated with neoadjuvant chemotherapy with carboplatin and paclitaxel.  He has had 3 cycles of that.  He is also on nivolumab.  He does continue to smoke.  He saw Dr. Halford Chessman.  He also saw Dr. Johnsie Cancel of cardiology yesterday.  He recommended an echocardiogram and an exercise tolerance test.  Those are scheduled.  Past Medical History:  Diagnosis Date   COPD (chronic obstructive pulmonary disease) ([place]) 2007   EMPHYSEMA   Depression    Dyspnea    occasional - no oxygen   GERD (gastroesophageal reflux disease) 2010   Hepatitis    Alcoholic    HLD (hyperlipidemia)    no meds   Hypertension    Port-A-Cath in place 01/01/2021    Current Outpatient Medications  Medication Sig Dispense Refill   amLODipine (NORVASC) 5 MG tablet Take 5 mg by mouth daily.     aspirin 81 MG tablet Take 81 mg by mouth daily.     CARBOPLATIN IV Inject into the vein every 21 ( twenty-one) days.     Fluticasone-Umeclidin-Vilant (TRELEGY ELLIPTA) 100-62.5-25 MCG/INH AEPB Inhale 1 puff into the lungs daily in  the afternoon.     gabapentin (NEURONTIN) 300 MG capsule Take 1 capsule (300 mg total) by mouth 3 (three) times daily. 90 capsule 3   lidocaine-prilocaine (EMLA) cream Apply small amount to port a cath site and cover with plastic wrap 1 hour prior to infusion appointments 30 g 3   metoprolol tartrate (LOPRESSOR) 25 MG tablet Take 25 mg by mouth 2 (two) times daily.     Multiple Vitamin (MULTIVITAMIN WITH MINERALS) TABS tablet Take 1 tablet by mouth daily.     NIVOLUMAB IV Inject 360 mg into the vein every 21 ( twenty-one) days.     oxyCODONE (OXY IR/ROXICODONE) 5 MG immediate release tablet Take 2 tablets (10 mg total) by mouth every 4 (four) hours as needed for severe pain. 30 tablet 0   PACLITAXEL IV Inject 200 mg/m2 into the vein every 21 ( twenty-one) days.     predniSONE (DELTASONE) 5 MG tablet Take 1 tablet (5 mg total) by mouth daily with breakfast. 30 tablet 0   prochlorperazine (COMPAZINE) 10 MG tablet Take 1 tablet (10 mg total) by mouth every 6 (six) hours as needed (Nausea or vomiting). 30 tablet 1   PROVENTIL HFA 108 (90 Base) MCG/ACT inhaler INHALE 2 PUFFS BY MOUTH EVERY 6 HOURS AS NEEDED FOR COUGHING, WHEEZING, OR SHORTNESS OF BREATH 20.1 g 1  sildenafil (VIAGRA) 50 MG tablet Take 50 mg by mouth daily as needed.     No current facility-administered medications for this visit.    Physical Exam BP (!) 154/62 (BP Location: Right Arm, Patient Position: Sitting)   Pulse (!) 136   Resp 20   Ht 5\' 9"  (1.753 m)   Wt 160 lb (72.6 kg)   SpO2 97% Comment: RA  BMI 23.43 kg/m  60 year old man appears older than stated age Well-developed and well-nourished Alert and oriented x3 Tachycardic at 120 and regular Lungs diminished but otherwise clear bilaterally with no rales or wheezes  Diagnostic Tests: CT CHEST WITH CONTRAST   TECHNIQUE: Multidetector CT imaging of the chest was performed during intravenous contrast administration.   CONTRAST:  33mL OMNIPAQUE IOHEXOL 300 MG/ML   SOLN   COMPARISON:  CT chest dated Dec 21, 2020   FINDINGS: Cardiovascular: Normal heart size. No pericardial effusion. Three-vessel coronary artery calcifications. Calcifications of the aortic valve. Atherosclerotic disease of the thoracic aorta. Right chest wall port with tip in the upper right atrium.   Mediastinum/Nodes: Interval decreased size of AP window lymph node measuring up to 6 mm in short axis on series 2, image 64, previously measured up to 9 mm. Esophagus and thyroid are unremarkable.   Lungs/Pleura: Debris noted in the right mainstem bronchus. Bilateral centrilobular emphysema. Spiculated right upper lobe mass with central cavitation, decreased in size compared to prior exam measuring 4.7 x 2.6 cm, previously measured 5.0 x 3.2 cm when measured in similar plane. Interval resolution of previously seen air-fluid level within the area of cavitation. No new or enlarging pulmonary nodules. Right lower lobe atelectasis.   Upper Abdomen: No acute abnormality.   Musculoskeletal: Old posterior right-sided rib fractures. No aggressive appearing osseous lesions   IMPRESSION: Decreased size of left upper lobe cavitary mass.   Decreased size of AP window lymph node.   Three-vessel coronary artery calcifications and calcifications of the aortic valve.   Aortic Atherosclerosis (ICD10-I70.0) and Emphysema (ICD10-J43.9).     Electronically Signed   By: Yetta Glassman MD   On: 03/16/2021 15:18 I reviewed his CT images and compared them to the films from May 2022.  He has had a good partial response radiographically.  Impression: Wilman Tucker is a 60 year old man ith a history of tobacco abuse, COPD, ethanol abuse, alcoholic pancreatitis, alcoholic hepatitis, hypertension, hyperlipidemia, reflux, and depression.  He has about a 50-pack-year history of smoking.  He says he is currently smoking less than a pack of cigarettes a day.  He was found to have a lung nodule  on low-dose screening CT.  That was hypermetabolic on PET along with a AP window lymph node.  Clinical stage was T2N2, stage IIIa.  Biopsy showed squamous cell carcinoma.  Now has had 3 cycles of neoadjuvant chemotherapy with a good partial response.  Had a long discussion with Mr. and Mrs. Radebaugh.  We reviewed the CT images.  There has been a good partial response.  He does appear to have potentially resectable disease at this point.  I discussed the options of local therapy with radiation or surgical resection with them.  We discussed the relative advantages and disadvantages of each.  They understand there is slightly better cure rate with surgical resection but is by no means guaranteed, having about a 25% chance of a cure.  It is a major operation and he is a high risk candidate.  He does need cardiology clearance.  His pulmonary function is  adequate to tolerate a lobectomy with an FEV1 of 1.72 (56%) and a DLCO of 11.39 (41%), but will have some significant respiratory limitations if he undergoes resection.  Again he is not a candidate for pneumonectomy.  We did briefly discussed the general nature of the procedure including the need for anesthesia, the incisions to be used, the expected hospital stay and recovery time.  I informed them of the indications, risk, benefits, and alternatives.  Is hard to put an exact number on the risk because we do not have his cardiac work-up completed yet.  However, he is at risk for death, MI, DVT, PE, bleeding, possible need for transfusion, infection, prolonged air leak, cardiac arrhythmias, as well as possibility of other unforeseeable complications including chronic pain.  Plan: Complete cardiac work-up Return on 04/08/2021 (echo on 04/06/2021) for additional discussion regarding possible surgical resection.  I spent over 30 minutes in review of records, images, and in consultation with Mr. Monteforte today. Melrose Nakayama, MD [facility] [phone]

## 2021-03-16 NOTE — Progress Notes (Signed)
[place], [place] [address]   CLINIC:  Medical Oncology/Hematology  PCP:  Rosita Fire, MD [place] / [place] [address] [phone]   REASON FOR VISIT:  Follow-up for  bronchogenic carcnioma in left upper lobe of lung  PRIOR THERAPY: carboplatin, paclitaxel and nivolumab q21d x3 cycles  NGS Results: not done  CURRENT THERAPY: surveillance  BRIEF ONCOLOGIC HISTORY:  Oncology History  Primary lung squamous cell carcinoma, left ([place])  12/31/2020 Cancer Staging   Staging form: Lung, AJCC 8th Edition - Clinical stage from 12/31/2020: Stage IIIA (cT2b, cN2, cM0) - Signed by Derek Jack, MD on 01/01/2021  Stage prefix: Initial diagnosis    01/01/2021 Initial Diagnosis   Primary lung squamous cell carcinoma, left ([place])    01/13/2021 -  Chemotherapy    Patient is on Treatment Plan: LUNG NSCLC CARBOPLATIN / PACLITAXEL Q21D X 3 CYCLES         CANCER STAGING: Cancer Staging Primary lung squamous cell carcinoma, left ([place]) Staging form: Lung, AJCC 8th Edition - Clinical stage from 12/31/2020: Stage IIIA (cT2b, cN2, cM0) - Signed by Derek Jack, MD on 01/01/2021   INTERVAL HISTORY:  [name], a 60 y.o. 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 Levin was last seen on 02/24/21.   Today he reports feeling well. He reports numbness in his finger tips and toes, but denies any associated pain. He reports weakness when lifting objects and stable joint pains. He denies history of CVA and MI. He is smoking <1 ppd. He reports waxing and waning appetite, and he denies itching, rash, n/v/d.   REVIEW OF SYSTEMS:  Review of Systems  Constitutional:  Positive for appetite change (75%) and fatigue (50%).  Gastrointestinal:  Negative for diarrhea, nausea and vomiting.  Musculoskeletal:  Positive for arthralgias.  Skin:  Negative for itching and  rash.  Neurological:  Positive for extremity weakness and numbness (fingers and toes).  All other systems reviewed and are negative.  PAST MEDICAL/SURGICAL HISTORY:  Past Medical History:  Diagnosis Date   COPD (chronic obstructive pulmonary disease) ([place]) 2007   EMPHYSEMA   Depression    Dyspnea    occasional - no oxygen   GERD (gastroesophageal reflux disease) 2010   Hepatitis    Alcoholic    HLD (hyperlipidemia)    no meds   Hypertension    Port-A-Cath in place 01/01/2021   Past Surgical History:  Procedure Laterality Date   BRONCHIAL BIOPSY  12/23/2020   Procedure: BRONCHIAL BIOPSIES;  Surgeon: [name], MD;  Location: [facility];  Service: Pulmonary;;   BRONCHIAL BRUSHINGS  12/23/2020   Procedure: BRONCHIAL BRUSHINGS;  Surgeon: [name], MD;  Location: [facility];  Service: Pulmonary;;   BRONCHIAL NEEDLE ASPIRATION BIOPSY  12/23/2020   Procedure: BRONCHIAL NEEDLE ASPIRATION BIOPSIES;  Surgeon: [name], MD;  Location: [facility];  Service: Pulmonary;;   BRONCHIAL WASHINGS  12/23/2020   Procedure: BRONCHIAL WASHINGS;  Surgeon: [name], MD;  Location[facility];  Service: Pulmonary;;   CARDIAC CATHETERIZATION  2010   COLONOSCOPY  03/14/2012   Procedure: COLONOSCOPY;  Surgeon: Rogene Houston, MD;  [facility];  Service: Endoscopy;  Laterality: N/A;  100   IR IMAGING GUIDED PORT INSERTION  01/12/2021   VIDEO BRONCHOSCOPY WITH ENDOBRONCHIAL NAVIGATION N/A 12/23/2020   Procedure: VIDEO BRONCHOSCOPY WITH ENDOBRONCHIAL NAVIGATION;  Surgeon: [name], MD;  Location: [facility];  Service: Pulmonary;  Laterality: N/A;   WRIST GANGLION EXCISION  1985    SOCIAL HISTORY:  Social History   Socioeconomic History   Marital status: Widowed    Spouse name: Not on file   Number of children: Not on file   Years of education: Not on file   Highest education level: Not on file  Occupational History   Not on file  Tobacco Use   Smoking  status: Every Day    Packs/day: 1.00    Years: 37.00    Pack years: 37.00    Types: Cigarettes   Smokeless tobacco: Never   Tobacco comments:    smokes 1/2 pack a day [facility] 03/08/21  Vaping Use   Vaping Use: Never used  Substance and Sexual Activity   Alcohol use: Yes    Alcohol/week: 22.0 standard drinks    Types: 21 Cans of beer, 1 Standard drinks or equivalent per week    Comment: daily   Drug use: No   Sexual activity: Yes  Other Topics Concern   Not on file  Social History Narrative   Not on file   Social Determinants of Health   Financial Resource Strain: Not on file  Food Insecurity: Not on file  Transportation Needs: No Transportation Needs   Lack of Transportation (Medical): No   Lack of Transportation (Non-Medical): No  Physical Activity: Inactive   Days of Exercise per Week: 0 days   Minutes of Exercise per Session: 0 min  Stress: Not on file  Social Connections: Not on file  Intimate Partner Violence: Not At Risk   Fear of Current or Ex-Partner: No   Emotionally Abused: No   Physically Abused: No   Sexually Abused: No    FAMILY HISTORY:  Family History  Problem Relation Age of Onset   Hypertension Mother    Asthma Father    Diabetes Sister    Hypertension Sister    Hypertension Brother    Hypertension Sister    Lupus Daughter     CURRENT MEDICATIONS:  Current Outpatient Medications  Medication Sig Dispense Refill   amLODipine (NORVASC) 5 MG tablet Take 5 mg by mouth daily.     aspirin 81 MG tablet Take 81 mg by mouth daily.     CARBOPLATIN IV Inject into the vein every 21 ( twenty-one) days.     Fluticasone-Umeclidin-Vilant (TRELEGY ELLIPTA) 100-62.5-25 MCG/INH AEPB Inhale 1 puff into the lungs daily in the afternoon.     gabapentin (NEURONTIN) 300 MG capsule Take 1 capsule (300 mg total) by mouth 3 (three) times daily. 90 capsule 3   lidocaine-prilocaine (EMLA) cream Apply small amount to port a cath site and cover with plastic wrap 1 hour prior  to infusion appointments 30 g 3   metoprolol tartrate (LOPRESSOR) 25 MG tablet Take 25 mg by mouth 2 (two) times daily.     Multiple Vitamin (MULTIVITAMIN WITH MINERALS) TABS tablet Take 1 tablet by mouth daily.     NIVOLUMAB IV Inject 360 mg into the vein every 21 ( twenty-one) days.     oxyCODONE (OXY IR/ROXICODONE) 5 MG immediate release tablet Take 2 tablets (10 mg total) by mouth every 4 (four) hours as needed for severe pain. 30 tablet 0   PACLITAXEL IV Inject 200 mg/m2 into the vein every 21 ( twenty-one) days.     predniSONE (DELTASONE) 5 MG tablet Take 1 tablet (5 mg total) by mouth daily with breakfast. 30 tablet 0  prochlorperazine (COMPAZINE) 10 MG tablet Take 1 tablet (10 mg total) by mouth every 6 (six) hours as needed (Nausea or vomiting). 30 tablet 1   PROVENTIL HFA 108 (90 Base) MCG/ACT inhaler INHALE 2 PUFFS BY MOUTH EVERY 6 HOURS AS NEEDED FOR COUGHING, WHEEZING, OR SHORTNESS OF BREATH 20.1 g 1   sildenafil (VIAGRA) 50 MG tablet Take 50 mg by mouth daily as needed.     No current facility-administered medications for this visit.    ALLERGIES:  Allergies  Allergen Reactions   Other Nausea Only and Other (See Comments)    No spicy foods- Patient cannot tolerate- HEARTBURN    PHYSICAL EXAM:  Performance status (ECOG): 1 - Symptomatic but completely ambulatory  Vitals:   03/17/21 0824  BP: (!) 160/75  Pulse: (!) 120  Resp: 18  Temp: 98.9 F (37.2 C)  SpO2: 98%   Wt Readings from Last 3 Encounters:  03/17/21 159 lb (72.1 kg)  03/16/21 160 lb (72.6 kg)  03/15/21 160 lb (72.6 kg)   Physical Exam Vitals reviewed.  Constitutional:      Appearance: Normal appearance.  Cardiovascular:     Rate and Rhythm: Normal rate and regular rhythm.     Pulses: Normal pulses.     Heart sounds: Normal heart sounds.  Pulmonary:     Effort: Pulmonary effort is normal.     Breath sounds: Wheezing (mild; front) present.  Neurological:     General: No focal deficit present.      Mental Status: He is alert and oriented to person, place, and time.  Psychiatric:        Mood and Affect: Mood normal.        Behavior: Behavior normal.     LABORATORY DATA:  I have reviewed the labs as listed.  CBC Latest Ref Rng & Units 03/17/2021 02/24/2021 02/03/2021  WBC 4.0 - 10.5 K/uL 5.0 6.1 7.9  Hemoglobin 13.0 - 17.0 g/dL 12.0(L) 11.5(L) 10.7(L)  Hematocrit 39.0 - 52.0 % 35.9(L) 35.6(L) 33.5(L)  Platelets 150 - 400 K/uL 161 209 399   CMP Latest Ref Rng & Units 03/17/2021 02/24/2021 02/03/2021  Glucose 70 - 99 mg/dL 131(H) 113(H) 113(H)  BUN 6 - 20 mg/dL 9 6 <5(L)  Creatinine 0.61 - 1.24 mg/dL 0.56(L) 0.56(L) 0.54(L)  Sodium 135 - 145 mmol/L 139 139 136  Potassium 3.5 - 5.1 mmol/L 3.5 3.5 3.7  Chloride 98 - 111 mmol/L 105 103 103  CO2 22 - 32 mmol/L _0 Calcium 8.9 - 10.3 mg/dL 9.6 9.1 8.5(L)  Total Protein 6.5 - 8.1 g/dL 7.9 7.5 7.7  Total Bilirubin 0.3 - 1.2 mg/dL 0.4 0.3 0.3  Alkaline Phos 38 - 126 U/L 81 92 210(H)  AST 15 - 41 U/L 33 34 82(H)  ALT 0 - 44 U/L 19 18 50(H)    DIAGNOSTIC IMAGING:  I have independently reviewed the scans and discussed with the patient. CT Chest W Contrast  Result Date: 03/16/2021 CLINICAL DATA:  Primary squamous cell carcinoma left lung EXAM: CT CHEST WITH CONTRAST TECHNIQUE: Multidetector CT imaging of the chest was performed during intravenous contrast administration. CONTRAST:  32m OMNIPAQUE IOHEXOL 300 MG/ML  SOLN COMPARISON:  CT chest dated Dec 21, 2020 FINDINGS: Cardiovascular: Normal heart size. No pericardial effusion. Three-vessel coronary artery calcifications. Calcifications of the aortic valve. Atherosclerotic disease of the thoracic aorta. Right chest wall port with tip in the upper right atrium. Mediastinum/Nodes: Interval decreased size of AP window lymph node measuring up to  6 mm in short axis on series 2, image 64, previously measured up to 9 mm. Esophagus and thyroid are unremarkable. Lungs/Pleura: Debris noted in the  right mainstem bronchus. Bilateral centrilobular emphysema. Spiculated right upper lobe mass with central cavitation, decreased in size compared to prior exam measuring 4.7 x 2.6 cm, previously measured 5.0 x 3.2 cm when measured in similar plane. Interval resolution of previously seen air-fluid level within the area of cavitation. No new or enlarging pulmonary nodules. Right lower lobe atelectasis. Upper Abdomen: No acute abnormality. Musculoskeletal: Old posterior right-sided rib fractures. No aggressive appearing osseous lesions IMPRESSION: Decreased size of left upper lobe cavitary mass. Decreased size of AP window lymph node. Three-vessel coronary artery calcifications and calcifications of the aortic valve. Aortic Atherosclerosis (ICD10-I70.0) and Emphysema (ICD10-J43.9). Electronically Signed   By: Yetta Glassman MD   On: 03/16/2021 15:18     ASSESSMENT:  1.  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 Halford Chessman for left upper lobe lung mass. - Is current active smoker, 1/2 pack/day for 35 years. - CT lung cancer screening scan on 10/16/2020 showed cavitary mass in the anterior left upper lobe measuring 4.4 x 3.0 x 3.5 cm with 2.5 cm solid nodular component inferomedially.  This abuts and involves the anterior pleura/chest wall.  Expansile lytic lesion in the right posterolateral ninth and 10th ribs (unchanged dating back to CT from 06/16/2016).  10 mm short axis AP window node suspicious for nodal metastasis. - No change in baseline cough.  No hemoptysis.  No chest pain. - Brain MRI was negative for metastatic disease. - PET scan on 12/14/2020 showed left upper lobe mass measuring 3.9 x 4 cm with SUV 17.1.  Some contiguous hypermetabolism along the left chest wall including anterolateral left third rib with SUV 5.4.  No gross osseous destruction.  AP window lymph node of 9 mm, mildly hypermetabolic, SUV 2.8. - On 2/53/6644 left upper lobe brushing and lavage  consistent with squamous cell carcinoma. - Left upper lobe fine-needle aspiration was also consistent with squamous cell carcinoma.  Lymph node could not be reached. - Caris testing with limited tissue showed MMR proficient, BRAF V6 energy negative, PD-L1 negative by 22 C3, 28-8, SP142.  (IHC/CISH results should be interpreted with caution given the potential for false negative results) - 3 cycles of carboplatin, paclitaxel and nivolumab from 01/13/2021 through 02/24/2021 based on keynote-816.   2.  Social/family history: - Previously worked in [employer] fund[employer], currently on disability. - Current active smoker, 1/2 pack/day for 35 years. - Drinks 6 pack of beers along with 1 quart of liquor every day. - 1 brother had throat cancer and another brother had lung cancer.   PLAN:  1.  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 - We reviewed CT chest with contrast dated 03/15/2021 which showed improvement in the size of right upper lobe mass with central cavitation.  Also interval decrease in size of AP window lymph node from 9 mm to 6 mm.  No new disease was seen. - He was evaluated by Dr. Roxan Hockey yesterday. - Patient is scheduled for stress test and echocardiogram soon. - He will see Dr. Roxan Hockey on 04/08/2021 to discuss surgery options.  I plan to see him back 4 weeks after surgery to discuss further plan.   2.  Smoking cessation: - He is still smoking less than 1 pack/day of  cigarettes. - I have counseled him to again quit smoking.  3.  Joint pains: - He reported pains in the joints involving ankles, knees, elbows and wrist. - We started him on prednisone 5 mg daily.  No improvement was seen.  He was told to take 5 mg every other day for 10 days and discontinue prednisone. - Continue oxycodone 5 mg 2 tablets every 6 hours as needed.  4.  Neuropathy: - Continue gabapentin 300 mg 3 times daily. - Constant numbness in  the fingertips and toes is stable.  No neuropathic pains.   Orders placed this encounter:  No orders of the defined types were placed in this encounter.    Derek Jack, MD [place] [phone]   I, Thana Ates, am acting as a scribe for Dr. Derek Jack.  I, Derek Jack MD, have reviewed the above documentation for accuracy and completeness, and I agree with the above.

## 2021-03-17 ENCOUNTER — Inpatient Hospital Stay (HOSPITAL_BASED_OUTPATIENT_CLINIC_OR_DEPARTMENT_OTHER): Payer: Medicare PPO | Admitting: Hematology

## 2021-03-17 ENCOUNTER — Other Ambulatory Visit: Payer: Self-pay

## 2021-03-17 ENCOUNTER — Ambulatory Visit (HOSPITAL_COMMUNITY): Payer: Medicare PPO

## 2021-03-17 ENCOUNTER — Other Ambulatory Visit (HOSPITAL_COMMUNITY): Payer: Self-pay

## 2021-03-17 ENCOUNTER — Inpatient Hospital Stay (HOSPITAL_COMMUNITY): Payer: Medicare PPO | Attending: Hematology

## 2021-03-17 VITALS — BP 160/75 | HR 120 | Temp 98.9°F | Resp 18 | Wt 159.0 lb

## 2021-03-17 DIAGNOSIS — I10 Essential (primary) hypertension: Secondary | ICD-10-CM | POA: Insufficient documentation

## 2021-03-17 DIAGNOSIS — F17210 Nicotine dependence, cigarettes, uncomplicated: Secondary | ICD-10-CM | POA: Insufficient documentation

## 2021-03-17 DIAGNOSIS — G629 Polyneuropathy, unspecified: Secondary | ICD-10-CM | POA: Diagnosis not present

## 2021-03-17 DIAGNOSIS — C3492 Malignant neoplasm of unspecified part of left bronchus or lung: Secondary | ICD-10-CM

## 2021-03-17 DIAGNOSIS — C3412 Malignant neoplasm of upper lobe, left bronchus or lung: Secondary | ICD-10-CM | POA: Insufficient documentation

## 2021-03-17 DIAGNOSIS — Z95828 Presence of other vascular implants and grafts: Secondary | ICD-10-CM

## 2021-03-17 DIAGNOSIS — Z801 Family history of malignant neoplasm of trachea, bronchus and lung: Secondary | ICD-10-CM | POA: Insufficient documentation

## 2021-03-17 LAB — CBC WITH DIFFERENTIAL/PLATELET
Abs Immature Granulocytes: 0.03 10*3/uL (ref 0.00–0.07)
Basophils Absolute: 0 10*3/uL (ref 0.0–0.1)
Basophils Relative: 1 %
Eosinophils Absolute: 0 10*3/uL (ref 0.0–0.5)
Eosinophils Relative: 0 %
HCT: 35.9 % — ABNORMAL LOW (ref 39.0–52.0)
Hemoglobin: 12 g/dL — ABNORMAL LOW (ref 13.0–17.0)
Immature Granulocytes: 1 %
Lymphocytes Relative: 39 %
Lymphs Abs: 1.9 10*3/uL (ref 0.7–4.0)
MCH: 30.3 pg (ref 26.0–34.0)
MCHC: 33.4 g/dL (ref 30.0–36.0)
MCV: 90.7 fL (ref 80.0–100.0)
Monocytes Absolute: 0.6 10*3/uL (ref 0.1–1.0)
Monocytes Relative: 11 %
Neutro Abs: 2.4 10*3/uL (ref 1.7–7.7)
Neutrophils Relative %: 48 %
Platelets: 161 10*3/uL (ref 150–400)
RBC: 3.96 MIL/uL — ABNORMAL LOW (ref 4.22–5.81)
RDW: 25 % — ABNORMAL HIGH (ref 11.5–15.5)
WBC: 5 10*3/uL (ref 4.0–10.5)
nRBC: 0 % (ref 0.0–0.2)

## 2021-03-17 LAB — COMPREHENSIVE METABOLIC PANEL
ALT: 19 U/L (ref 0–44)
AST: 33 U/L (ref 15–41)
Albumin: 4.1 g/dL (ref 3.5–5.0)
Alkaline Phosphatase: 81 U/L (ref 38–126)
Anion gap: 8 (ref 5–15)
BUN: 9 mg/dL (ref 6–20)
CO2: 26 mmol/L (ref 22–32)
Calcium: 9.6 mg/dL (ref 8.9–10.3)
Chloride: 105 mmol/L (ref 98–111)
Creatinine, Ser: 0.56 mg/dL — ABNORMAL LOW (ref 0.61–1.24)
GFR, Estimated: >60 mL/min (ref >60)
Glucose, Bld: 131 mg/dL — ABNORMAL HIGH (ref 70–99)
Potassium: 3.5 mmol/L (ref 3.5–5.1)
Sodium: 139 mmol/L (ref 135–145)
Total Bilirubin: 0.4 mg/dL (ref 0.3–1.2)
Total Protein: 7.9 g/dL (ref 6.5–8.1)

## 2021-03-17 LAB — TSH: TSH: 1.088 u[IU]/mL (ref 0.350–4.500)

## 2021-03-17 LAB — MAGNESIUM: Magnesium: 2 mg/dL (ref 1.7–2.4)

## 2021-03-17 MED ORDER — HEPARIN SOD (PORK) LOCK FLUSH 100 UNIT/ML IV SOLN
500.0000 [IU] | Freq: Once | INTRAVENOUS | Status: AC
  Administered 2021-03-17: 500 [IU] via INTRAVENOUS

## 2021-03-17 MED ORDER — SODIUM CHLORIDE 0.9% FLUSH
10.0000 mL | Freq: Once | INTRAVENOUS | Status: AC
  Administered 2021-03-17: 10 mL via INTRAVENOUS

## 2021-03-17 NOTE — Patient Instructions (Addendum)
[place] at [facility] Discharge Instructions  You were seen today by Dr. Delton Coombes. He went over your recent results and scans. Dr. Delton Coombes will see you back in during the first week of October for labs and follow up.   Thank you for choosing [place] at [facility] to provide your oncology and hematology care.  To afford each patient quality time with our provider, please arrive at least 15 minutes before your scheduled appointment time.   If you have a lab appointment with the [facility] please come in thru the Main Entrance and check in at the main information desk  You need to re-schedule your appointment should you arrive 10 or more minutes late.  We strive to give you quality time with our providers, and arriving late affects you and other patients whose appointments are after yours.  Also, if you no show three or more times for appointments you may be dismissed from the clinic at the providers discretion.     Again, thank you for choosing [facility].  Our hope is that these requests will decrease the amount of time that you wait before being seen by our physicians.       _____________________________________________________________  Should you have questions after your visit to [facility], please contact our office at [phone] between the hours of 8:00 a.m. and 4:30 p.m.  Voicemails left after 4:00 p.m. will not be returned until the following business day.  For prescription refill requests, have your pharmacy contact our office and allow 72 hours.    Cancer Center Support Programs:   > Cancer Support Group  2nd Tuesday of the month 1pm-2pm, Journey Room

## 2021-03-17 NOTE — Progress Notes (Signed)
Patients port flushed without difficulty.  Good blood return noted with no bruising or swelling noted at site.  Band aid applied.  VSS with discharge and left in satisfactory condition with no s/s of distress noted.   

## 2021-03-18 ENCOUNTER — Other Ambulatory Visit (HOSPITAL_COMMUNITY): Payer: Self-pay | Admitting: *Deleted

## 2021-03-18 ENCOUNTER — Ambulatory Visit (HOSPITAL_COMMUNITY): Payer: Medicare PPO

## 2021-03-18 ENCOUNTER — Encounter (HOSPITAL_COMMUNITY): Payer: Medicare PPO

## 2021-03-18 DIAGNOSIS — C3492 Malignant neoplasm of unspecified part of left bronchus or lung: Secondary | ICD-10-CM

## 2021-03-18 MED ORDER — OXYCODONE HCL 10 MG PO TABS: 10.0000 mg | ORAL_TABLET | ORAL | 0 refills | Status: DC | PRN

## 2021-03-24 DIAGNOSIS — J449 Chronic obstructive pulmonary disease, unspecified: Secondary | ICD-10-CM | POA: Diagnosis not present

## 2021-03-24 DIAGNOSIS — F17200 Nicotine dependence, unspecified, uncomplicated: Secondary | ICD-10-CM | POA: Diagnosis not present

## 2021-03-24 DIAGNOSIS — C3490 Malignant neoplasm of unspecified part of unspecified bronchus or lung: Secondary | ICD-10-CM | POA: Diagnosis not present

## 2021-03-24 DIAGNOSIS — I10 Essential (primary) hypertension: Secondary | ICD-10-CM | POA: Diagnosis not present

## 2021-04-01 ENCOUNTER — Ambulatory Visit (HOSPITAL_COMMUNITY)
Admission: RE | Admit: 2021-04-01 | Discharge: 2021-04-01 | Disposition: A | Discharge status: Home / Self Care | Payer: Medicare PPO | Source: Ambulatory Visit | Attending: Cardiovascular Disease | Admitting: Cardiovascular Disease

## 2021-04-01 ENCOUNTER — Other Ambulatory Visit: Payer: Self-pay

## 2021-04-01 ENCOUNTER — Encounter (HOSPITAL_COMMUNITY): Payer: Self-pay

## 2021-04-01 DIAGNOSIS — Z01818 Encounter for other preprocedural examination: Secondary | ICD-10-CM | POA: Insufficient documentation

## 2021-04-01 DIAGNOSIS — I2510 Atherosclerotic heart disease of native coronary artery without angina pectoris: Secondary | ICD-10-CM | POA: Diagnosis not present

## 2021-04-01 DIAGNOSIS — Z049 Encounter for examination and observation for unspecified reason: Secondary | ICD-10-CM | POA: Diagnosis not present

## 2021-04-01 DIAGNOSIS — I493 Ventricular premature depolarization: Secondary | ICD-10-CM | POA: Insufficient documentation

## 2021-04-01 DIAGNOSIS — R0600 Dyspnea, unspecified: Secondary | ICD-10-CM | POA: Insufficient documentation

## 2021-04-01 HISTORY — DX: Malignant (primary) neoplasm, unspecified: C80.1

## 2021-04-01 LAB — NM MYOCAR MULTI W/SPECT W/WALL MOTION / EF
Angina Index: 0
Base ST Depression (mm): 0 mm
Duke Treadmill Score: 4
Estimated workload: 5.3
Exercise duration (min): 3 min
Exercise duration (sec): 44 s
LV dias vol: 66 mL (ref 62–150)
LV sys vol: 23 mL
MPHR: 162 {beats}/min
Nuc Stress EF: 66 %
Peak HR: 144 {beats}/min
Percent HR: 88 %
RATE: 0.3
RPE: 17
Rest HR: 107 {beats}/min
Rest Nuclear Isotope Dose: 9.7 mCi
SDS: 2
SRS: 2
SSS: 4
ST Depression (mm): 0 mm
Stress Nuclear Isotope Dose: 30 mCi
TID: 1.08

## 2021-04-01 MED ORDER — TECHNETIUM TC 99M TETROFOSMIN IV KIT
10.0000 | PACK | Freq: Once | INTRAVENOUS | Status: AC | PRN
  Administered 2021-04-01: 9.7 via INTRAVENOUS

## 2021-04-01 MED ORDER — SODIUM CHLORIDE FLUSH 0.9 % IV SOLN
INTRAVENOUS | Status: AC
  Administered 2021-04-01: 10 mL via INTRAVENOUS
  Filled 2021-04-01: qty 10

## 2021-04-01 MED ORDER — TECHNETIUM TC 99M TETROFOSMIN IV KIT
30.0000 | PACK | Freq: Once | INTRAVENOUS | Status: AC | PRN
  Administered 2021-04-01: 30 via INTRAVENOUS

## 2021-04-01 MED ORDER — REGADENOSON 0.4 MG/5ML IV SOLN
INTRAVENOUS | Status: AC
  Filled 2021-04-01: qty 5

## 2021-04-02 ENCOUNTER — Telehealth: Payer: Self-pay

## 2021-04-02 DIAGNOSIS — R931 Abnormal findings on diagnostic imaging of heart and coronary circulation: Secondary | ICD-10-CM

## 2021-04-02 NOTE — Telephone Encounter (Signed)
-----   Message from Josue Hector, MD sent at 04/01/2021  6:31 PM EDT ----- Myovue is low risk regarding ischemia suggests old IMI need echo to confirm RWMA if none and EF normal ok for surgery

## 2021-04-02 NOTE — Telephone Encounter (Signed)
Contacted patient who verbalized understanding. Pt agreeable to Echo. Pt had no questions or concerns at this time.

## 2021-04-06 ENCOUNTER — Ambulatory Visit (HOSPITAL_COMMUNITY)
Admission: RE | Admit: 2021-04-06 | Discharge: 2021-04-06 | Disposition: A | Discharge status: Home / Self Care | Payer: Medicare PPO | Source: Ambulatory Visit | Attending: Cardiovascular Disease | Admitting: Cardiovascular Disease

## 2021-04-06 ENCOUNTER — Other Ambulatory Visit: Payer: Self-pay

## 2021-04-06 DIAGNOSIS — I350 Nonrheumatic aortic (valve) stenosis: Secondary | ICD-10-CM | POA: Insufficient documentation

## 2021-04-06 LAB — ECHOCARDIOGRAM COMPLETE
Area-P 1/2: 9.85 cm2
P 1/2 time: 353 msec
S' Lateral: 1.7 cm

## 2021-04-06 NOTE — Progress Notes (Signed)
*  PRELIMINARY RESULTS* Echocardiogram 2D Echocardiogram has been performed.  Bryan Fleming 04/06/2021, 12:33 PM

## 2021-04-08 ENCOUNTER — Ambulatory Visit (INDEPENDENT_AMBULATORY_CARE_PROVIDER_SITE_OTHER): Payer: Medicare PPO | Admitting: Thoracic Surgery (Cardiothoracic Vascular Surgery)

## 2021-04-08 ENCOUNTER — Other Ambulatory Visit: Payer: Self-pay

## 2021-04-08 VITALS — BP 117/75 | HR 122 | Ht 69.0 in | Wt 158.0 lb

## 2021-04-08 DIAGNOSIS — J984 Other disorders of lung: Secondary | ICD-10-CM | POA: Diagnosis not present

## 2021-04-09 NOTE — H&P (View-Only) (Signed)
[address]       [place] [address]             [phone]       HPI: Mr. Coger returns for follow-up to further discuss possible lung resection.  Bryan Fleming is a 60 year old man with history of tobacco abuse, COPD, ethanol abuse, alcoholic pancreatitis, alcoholic hepatitis, hypertension, hyperlipidemia, reflux, and coronary calcification on CT.  He was found to have a lung mass on the low-dose screening CT.  PET/CT showed the mass was hypermetabolic and there also was a suspicious AP window lymph node.  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  He was treated with neoadjuvant chemotherapy with 3 cycles of carboplatin and paclitaxel.  He then was started on nivolumab.  A follow-up CT showed a good partial response.  I saw him on 03/16/2021 and we discussed possible surgical resection.  We discussed that options at this point include radiation therapy versus surgical resection and that surgery likely offers a better chance, but no guarantee of a cure.  He was interested in pursuing surgery, so he was sent for cardiology consultation.  He saw Dr. Johnsie Cancel.  He had a stress test followed by an echocardiogram.    His stress Myoview was low risk, but suggested an old MI.  An echocardiogram showed preserved left ventricular function with no regional wall motion abnormalities.  He does continue to smoke although he says he has cut back to 3 to 4 cigarettes a day.  Zubrod Score: At the time of surgery this patient's most appropriate activity status/level should be described as: []     0    Normal activity, no symptoms [x]     1    Restricted in physical strenuous activity but ambulatory, able to do out light work []     2    Ambulatory and capable of self care, unable to do work activities, up and about >50 % of waking hours                              []     3    Only limited self care, in bed greater than 50% of  waking hours []     4    Completely disabled, no self care, confined to bed or chair []     5    Moribund  Past Medical History:  Diagnosis Date   Cancer ([place])    Lung cancer   COPD (chronic obstructive pulmonary disease) ([place]) 2007   EMPHYSEMA   Depression    Dyspnea    occasional - no oxygen   GERD (gastroesophageal reflux disease) 2010   Hepatitis    Alcoholic    HLD (hyperlipidemia)    no meds   Hypertension    Port-A-Cath in place 01/01/2021   Past Surgical History:  Procedure Laterality Date   BRONCHIAL BIOPSY  12/23/2020   Procedure: BRONCHIAL BIOPSIES;  Surgeon: [name], MD;  Location: [facility] ENDOSCOPY;  Service: Pulmonary;;   BRONCHIAL BRUSHINGS  12/23/2020   Procedure: BRONCHIAL BRUSHINGS;  Surgeon: [name], MD;  Location: [facility];  Service: Pulmonary;;   BRONCHIAL NEEDLE ASPIRATION BIOPSY  12/23/2020   Procedure: BRONCHIAL NEEDLE ASPIRATION BIOPSIES;  Surgeon: [name], MD;  Location: [place];  Service: Pulmonary;;   BRONCHIAL WASHINGS  12/23/2020   Procedure: BRONCHIAL WASHINGS;  Surgeon: [name], MD;  Location: [facility] ENDOSCOPY;  Service: Pulmonary;;   CARDIAC CATHETERIZATION  2010   COLONOSCOPY  03/14/2012   Procedure: COLONOSCOPY;  Surgeon: Rogene Houston, MD;  [facility];  Service: Endoscopy;  Laterality: N/A;  100   IR IMAGING GUIDED PORT INSERTION  01/12/2021   VIDEO BRONCHOSCOPY WITH ENDOBRONCHIAL NAVIGATION N/A 12/23/2020   Procedure: VIDEO BRONCHOSCOPY WITH ENDOBRONCHIAL NAVIGATION;  Surgeon: [name], MD;  Location: [facility] ENDOSCOPY;  Service: Pulmonary;  Laterality: N/A;   WRIST GANGLION EXCISION  1985    Current Outpatient Medications  Medication Sig Dispense Refill   amLODipine (NORVASC) 5 MG tablet Take 5 mg by mouth daily.     aspirin 81 MG tablet Take 81 mg by mouth daily.     CARBOPLATIN IV Inject into the vein every 21 ( twenty-one) days.     Fluticasone-Umeclidin-Vilant (TRELEGY ELLIPTA) 100-62.5-25  MCG/INH AEPB Inhale 1 puff into the lungs daily in the afternoon.     gabapentin (NEURONTIN) 300 MG capsule Take 1 capsule (300 mg total) by mouth 3 (three) times daily. 90 capsule 3   lidocaine-prilocaine (EMLA) cream Apply small amount to port a cath site and cover with plastic wrap 1 hour prior to infusion appointments 30 g 3   metoprolol tartrate (LOPRESSOR) 25 MG tablet Take 25 mg by mouth 2 (two) times daily.     Multiple Vitamin (MULTIVITAMIN WITH MINERALS) TABS tablet Take 1 tablet by mouth daily.     NIVOLUMAB IV Inject 360 mg into the vein every 21 ( twenty-one) days.     oxyCODONE 10 MG TABS Take 1 tablet (10 mg total) by mouth every 4 (four) hours as needed for severe pain. 180 tablet 0   PACLITAXEL IV Inject 200 mg/m2 into the vein every 21 ( twenty-one) days.     predniSONE (DELTASONE) 5 MG tablet Take 1 tablet (5 mg total) by mouth daily with breakfast. 30 tablet 0   prochlorperazine (COMPAZINE) 10 MG tablet Take 1 tablet (10 mg total) by mouth every 6 (six) hours as needed (Nausea or vomiting). 30 tablet 1   PROVENTIL HFA 108 (90 Base) MCG/ACT inhaler INHALE 2 PUFFS BY MOUTH EVERY 6 HOURS AS NEEDED FOR COUGHING, WHEEZING, OR SHORTNESS OF BREATH 20.1 g 1   sildenafil (VIAGRA) 50 MG tablet Take 50 mg by mouth daily as needed.     No current facility-administered medications for this visit.    Physical Exam Vitals reviewed.  Constitutional:      Appearance: Normal appearance.  HENT:     Head: Normocephalic and atraumatic.  Cardiovascular:     Rate and Rhythm: Normal rate and regular rhythm.     Heart sounds: Normal heart sounds.  Pulmonary:     Effort: Pulmonary effort is normal. No respiratory distress.     Breath sounds: No wheezing or rales.     Comments: Diminished breath sounds bilaterally Neurological:     Mental Status: He is alert.    Diagnostic Tests: Echocardiogram 04/06/2021 IMPRESSIONS     1. Left ventricular ejection fraction, by estimation, is >75%. The  left  ventricle has hyperdynamic function. The left ventricle has no regional  wall motion abnormalities. There is moderate left ventricular hypertrophy.  Left ventricular diastolic  parameters are indeterminate. Increased LVOT gradient with Valsalva,  approximately 2-3 m/s.   2. Right ventricular systolic function is normal. The right ventricular  size is normal. Tricuspid regurgitation signal is inadequate for assessing  PA pressure.   3. The mitral valve  is grossly normal. Trivial mitral valve  regurgitation.   4. The aortic valve is tricuspid. There is mild calcification of the  aortic valve. Aortic valve regurgitation is mild.   5. The inferior vena cava is normal in size with greater than 50%  respiratory variability, suggesting right atrial pressure of 3 mmHg.   Myoview 04/01/2021  Narrative & Impression      Findings are consistent with prior inferior myocardial infarction with minimal peri-infarct ischemia. The study is low risk.   No ST deviation was noted.   Defect 1: There is a medium defect with moderate reduction in uptake present in the apical to basal inferior location(s) with very minimal reversibility.   Left ventricular function is normal.   Achieved METs not representative of true cardiopulmonary functional capacity, exercise duration was limited by joint pain. In this setting Duke treadmill score was not calculated     Pulmonary function testing FVC equals 3.01 (77%) FEV1 1.72 (56%), no improvement with bronchodilator TLC 9.22 (135%) DLCO 11.39 (41%)  Impression: Bryan Fleming is a 60 year old man with history of tobacco abuse, COPD, ethanol abuse, alcoholic pancreatitis, alcoholic hepatitis, hypertension, hyperlipidemia, reflux, depression, and coronary calcification by CT scan.  He has a greater than 50-pack-year history of smoking.  He currently smoking less than half a pack of cigarettes daily.  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  He underwent 3 cycles of neoadjuvant chemotherapy with carboplatin and paclitaxel.  He also has been treated with nivolumab.  He had a good partial radiographic response.  He has adequate pulmonary reserve to tolerate a lobectomy, although with his impaired diffusion capacity, it is possible he may need oxygen.  We again discussed the option of surgery versus radiation.  He again strongly favor surgery over radiation.  He understands that we may still recommend radiation depending on intraoperative findings and final pathology.  He also likely will need additional chemotherapy.  I described postoperation to Mr. Deese and his wife.  His daughter listened in on the telephone.  We would attempt to do this with a robotic approach although there is always a possibility we might have to convert to an open procedure.  I informed them of the general nature of the procedure including the incisions to be used, the need for general anesthesia, the use of drainage tubes postoperatively, the expected hospital stay, and the overall recovery.  I informed him of the indications, risks, benefits, and alternatives.  They understand the risks include, but not limited to death, MI, DVT, PE, bleeding, possible need for transfusion, infection, prolonged air leak, cardiac arrhythmias, as well as possibility of other unforeseeable complications.  He understands and accepts the risks and wishes to proceed.  Plan: Robotic left upper lobectomy and lymph node dissection. We will call Mr. Selden to schedule.  Melrose Nakayama, MD [facility] [phone]

## 2021-04-09 NOTE — Progress Notes (Signed)
[address]       [place] [address]             [phone]       HPI: Bryan Fleming returns for follow-up to further discuss possible lung resection.  Bryan Fleming is a 60 year old man with history of tobacco abuse, COPD, ethanol abuse, alcoholic pancreatitis, alcoholic hepatitis, hypertension, hyperlipidemia, reflux, and coronary calcification on CT.  He was found to have a lung mass on the low-dose screening CT.  PET/CT showed the mass was hypermetabolic and there also was a suspicious AP window lymph node.  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  He was treated with neoadjuvant chemotherapy with 3 cycles of carboplatin and paclitaxel.  He then was started on nivolumab.  A follow-up CT showed a good partial response.  I saw him on 03/16/2021 and we discussed possible surgical resection.  We discussed that options at this point include radiation therapy versus surgical resection and that surgery likely offers a better chance, but no guarantee of a cure.  He was interested in pursuing surgery, so he was sent for cardiology consultation.  He saw Dr. Johnsie Cancel.  He had a stress test followed by an echocardiogram.    His stress Myoview was low risk, but suggested an old MI.  An echocardiogram showed preserved left ventricular function with no regional wall motion abnormalities.  He does continue to smoke although he says he has cut back to 3 to 4 cigarettes a day.  Zubrod Score: At the time of surgery this patient's most appropriate activity status/level should be described as: []     0    Normal activity, no symptoms [x]     1    Restricted in physical strenuous activity but ambulatory, able to do out light work []     2    Ambulatory and capable of self care, unable to do work activities, up and about >50 % of waking hours                              []     3    Only limited self care, in bed greater than 50% of  waking hours []     4    Completely disabled, no self care, confined to bed or chair []     5    Moribund  Past Medical History:  Diagnosis Date   Cancer ([place])    Lung cancer   COPD (chronic obstructive pulmonary disease) ([place]) 2007   EMPHYSEMA   Depression    Dyspnea    occasional - no oxygen   GERD (gastroesophageal reflux disease) 2010   Hepatitis    Alcoholic    HLD (hyperlipidemia)    no meds   Hypertension    Port-A-Cath in place 01/01/2021   Past Surgical History:  Procedure Laterality Date   BRONCHIAL BIOPSY  12/23/2020   Procedure: BRONCHIAL BIOPSIES;  Surgeon: [name], MD;  Location: [facility] ENDOSCOPY;  Service: Pulmonary;;   BRONCHIAL BRUSHINGS  12/23/2020   Procedure: BRONCHIAL BRUSHINGS;  Surgeon: [name], MD;  Location: [facility];  Service: Pulmonary;;   BRONCHIAL NEEDLE ASPIRATION BIOPSY  12/23/2020   Procedure: BRONCHIAL NEEDLE ASPIRATION BIOPSIES;  Surgeon: [name], MD;  Location: [place];  Service: Pulmonary;;   BRONCHIAL WASHINGS  12/23/2020   Procedure: BRONCHIAL WASHINGS;  Surgeon: [name], MD;  Location: [facility] ENDOSCOPY;  Service: Pulmonary;;   CARDIAC CATHETERIZATION  2010   COLONOSCOPY  03/14/2012   Procedure: COLONOSCOPY;  Surgeon: Rogene Houston, MD;  [facility];  Service: Endoscopy;  Laterality: N/A;  100   IR IMAGING GUIDED PORT INSERTION  01/12/2021   VIDEO BRONCHOSCOPY WITH ENDOBRONCHIAL NAVIGATION N/A 12/23/2020   Procedure: VIDEO BRONCHOSCOPY WITH ENDOBRONCHIAL NAVIGATION;  Surgeon: [name], MD;  Location: [facility] ENDOSCOPY;  Service: Pulmonary;  Laterality: N/A;   WRIST GANGLION EXCISION  1985    Current Outpatient Medications  Medication Sig Dispense Refill   amLODipine (NORVASC) 5 MG tablet Take 5 mg by mouth daily.     aspirin 81 MG tablet Take 81 mg by mouth daily.     CARBOPLATIN IV Inject into the vein every 21 ( twenty-one) days.     Fluticasone-Umeclidin-Vilant (TRELEGY ELLIPTA) 100-62.5-25  MCG/INH AEPB Inhale 1 puff into the lungs daily in the afternoon.     gabapentin (NEURONTIN) 300 MG capsule Take 1 capsule (300 mg total) by mouth 3 (three) times daily. 90 capsule 3   lidocaine-prilocaine (EMLA) cream Apply small amount to port a cath site and cover with plastic wrap 1 hour prior to infusion appointments 30 g 3   metoprolol tartrate (LOPRESSOR) 25 MG tablet Take 25 mg by mouth 2 (two) times daily.     Multiple Vitamin (MULTIVITAMIN WITH MINERALS) TABS tablet Take 1 tablet by mouth daily.     NIVOLUMAB IV Inject 360 mg into the vein every 21 ( twenty-one) days.     oxyCODONE 10 MG TABS Take 1 tablet (10 mg total) by mouth every 4 (four) hours as needed for severe pain. 180 tablet 0   PACLITAXEL IV Inject 200 mg/m2 into the vein every 21 ( twenty-one) days.     predniSONE (DELTASONE) 5 MG tablet Take 1 tablet (5 mg total) by mouth daily with breakfast. 30 tablet 0   prochlorperazine (COMPAZINE) 10 MG tablet Take 1 tablet (10 mg total) by mouth every 6 (six) hours as needed (Nausea or vomiting). 30 tablet 1   PROVENTIL HFA 108 (90 Base) MCG/ACT inhaler INHALE 2 PUFFS BY MOUTH EVERY 6 HOURS AS NEEDED FOR COUGHING, WHEEZING, OR SHORTNESS OF BREATH 20.1 g 1   sildenafil (VIAGRA) 50 MG tablet Take 50 mg by mouth daily as needed.     No current facility-administered medications for this visit.    Physical Exam Vitals reviewed.  Constitutional:      Appearance: Normal appearance.  HENT:     Head: Normocephalic and atraumatic.  Cardiovascular:     Rate and Rhythm: Normal rate and regular rhythm.     Heart sounds: Normal heart sounds.  Pulmonary:     Effort: Pulmonary effort is normal. No respiratory distress.     Breath sounds: No wheezing or rales.     Comments: Diminished breath sounds bilaterally Neurological:     Mental Status: He is alert.    Diagnostic Tests: Echocardiogram 04/06/2021 IMPRESSIONS     1. Left ventricular ejection fraction, by estimation, is >75%. The  left  ventricle has hyperdynamic function. The left ventricle has no regional  wall motion abnormalities. There is moderate left ventricular hypertrophy.  Left ventricular diastolic  parameters are indeterminate. Increased LVOT gradient with Valsalva,  approximately 2-3 m/s.   2. Right ventricular systolic function is normal. The right ventricular  size is normal. Tricuspid regurgitation signal is inadequate for assessing  PA pressure.   3. The mitral valve  is grossly normal. Trivial mitral valve  regurgitation.   4. The aortic valve is tricuspid. There is mild calcification of the  aortic valve. Aortic valve regurgitation is mild.   5. The inferior vena cava is normal in size with greater than 50%  respiratory variability, suggesting right atrial pressure of 3 mmHg.   Myoview 04/01/2021  Narrative & Impression      Findings are consistent with prior inferior myocardial infarction with minimal peri-infarct ischemia. The study is low risk.   No ST deviation was noted.   Defect 1: There is a medium defect with moderate reduction in uptake present in the apical to basal inferior location(s) with very minimal reversibility.   Left ventricular function is normal.   Achieved METs not representative of true cardiopulmonary functional capacity, exercise duration was limited by joint pain. In this setting Duke treadmill score was not calculated     Pulmonary function testing FVC equals 3.01 (77%) FEV1 1.72 (56%), no improvement with bronchodilator TLC 9.22 (135%) DLCO 11.39 (41%)  Impression: Bryan Fleming is a 60 year old man with history of tobacco abuse, COPD, ethanol abuse, alcoholic pancreatitis, alcoholic hepatitis, hypertension, hyperlipidemia, reflux, depression, and coronary calcification by CT scan.  He has a greater than 50-pack-year history of smoking.  He currently smoking less than half a pack of cigarettes daily.  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  He underwent 3 cycles of neoadjuvant chemotherapy with carboplatin and paclitaxel.  He also has been treated with nivolumab.  He had a good partial radiographic response.  He has adequate pulmonary reserve to tolerate a lobectomy, although with his impaired diffusion capacity, it is possible he may need oxygen.  We again discussed the option of surgery versus radiation.  He again strongly favor surgery over radiation.  He understands that we may still recommend radiation depending on intraoperative findings and final pathology.  He also likely will need additional chemotherapy.  I described postoperation to Bryan Fleming and his wife.  His daughter listened in on the telephone.  We would attempt to do this with a robotic approach although there is always a possibility we might have to convert to an open procedure.  I informed them of the general nature of the procedure including the incisions to be used, the need for general anesthesia, the use of drainage tubes postoperatively, the expected hospital stay, and the overall recovery.  I informed him of the indications, risks, benefits, and alternatives.  They understand the risks include, but not limited to death, MI, DVT, PE, bleeding, possible need for transfusion, infection, prolonged air leak, cardiac arrhythmias, as well as possibility of other unforeseeable complications.  He understands and accepts the risks and wishes to proceed.  Plan: Robotic left upper lobectomy and lymph node dissection. We will call Bryan Fleming to schedule.  Melrose Nakayama, MD [facility] [phone]

## 2021-04-13 ENCOUNTER — Encounter: Payer: Self-pay | Admitting: *Deleted

## 2021-04-13 ENCOUNTER — Other Ambulatory Visit: Payer: Self-pay | Admitting: *Deleted

## 2021-04-13 DIAGNOSIS — J984 Other disorders of lung: Secondary | ICD-10-CM

## 2021-04-14 ENCOUNTER — Other Ambulatory Visit (HOSPITAL_COMMUNITY): Payer: Self-pay | Admitting: Physician Assistant

## 2021-04-14 DIAGNOSIS — Z9189 Other specified personal risk factors, not elsewhere classified: Secondary | ICD-10-CM

## 2021-04-14 MED ORDER — NALOXONE HCL 4 MG/0.1ML NA LIQD: NASAL | 0 refills | Status: DC

## 2021-04-14 NOTE — Progress Notes (Signed)
Prescription for Narcan intranasal spray sent to patient's pharmacy due to daily opioid analgesia of 90 MME.  Patient called and informed of prescription and how it should be used.

## 2021-04-15 ENCOUNTER — Telehealth: Payer: Self-pay

## 2021-04-15 NOTE — Telephone Encounter (Signed)
-----   Message from Josue Hector, MD sent at 04/10/2021  2:28 PM EDT ----- [place] for surgery may have some aspects of HOCM avoid overdiuresis hydrate during surgery f/u with me I 6 months

## 2021-04-15 NOTE — Telephone Encounter (Signed)
Pt notified and voiced understanding. Pt had no questions or concerns at this time. Recall put in for 56m f/u with PN

## 2021-04-16 NOTE — Pre-Procedure Instructions (Signed)
Surgical Instructions    Your procedure is scheduled on Wednesday, September 14th.  Report to [facility] Main Entrance "A" at 10:00 A.M., then check in with the Admitting office.  Call this number if you have problems the morning of surgery:  [phone]   If you have any questions prior to your surgery date call [phone]: Open Monday-Friday 8am-4pm    Remember:  Do not eat or drink after midnight the night before your surgery    Take these medicines the morning of surgery with A SIP OF WATER  amLODipine (NORVASC)  gabapentin (NEURONTIN)  omeprazole (PRILOSEC) Fluticasone-Umeclidin-Vilant (TRELEGY ELLIPTA)   Take these medications as needed: oxyCODONE  Albuterol inhaler-please bring inhalers with you to the hospital.   As of today, STOP taking any Aspirin (unless otherwise instructed by your surgeon) Aleve, Naproxen, Ibuprofen, Motrin, Advil, Goody's, BC's, all herbal medications, fish oil, and all vitamins.                     Do NOT Smoke (Tobacco/Vaping) or drink Alcohol 24 hours prior to your procedure.  If you use a CPAP at night, you may bring all equipment for your overnight stay.   Contacts, glasses, piercing's, hearing aid's, dentures or partials may not be worn into surgery, please bring cases for these belongings.    For patients admitted to the hospital, discharge time will be determined by your treatment team.   Patients discharged the day of surgery will not be allowed to drive home, and someone needs to stay with them for 24 hours.  ONLY 1 SUPPORT PERSON MAY BE PRESENT WHILE YOU ARE IN SURGERY. IF YOU ARE TO BE ADMITTED ONCE YOU ARE IN YOUR ROOM YOU WILL BE ALLOWED TWO (2) VISITORS.  Minor children may have two parents present. Special consideration for safety and communication needs will be reviewed on a case by case basis.   Special instructions:   [place]- Preparing For Surgery  Before surgery, you can play an important role. Because skin is not  sterile, your skin needs to be as free of germs as possible. You can reduce the number of germs on your skin by washing with CHG (chlorahexidine gluconate) Soap before surgery.  CHG is an antiseptic cleaner which kills germs and bonds with the skin to continue killing germs even after washing.    Oral Hygiene is also important to reduce your risk of infection.  Remember - BRUSH YOUR TEETH THE MORNING OF SURGERY WITH YOUR REGULAR TOOTHPASTE  Please do not use if you have an allergy to CHG or antibacterial soaps. If your skin becomes reddened/irritated stop using the CHG.  Do not shave (including legs and underarms) for at least 48 hours prior to first CHG shower. It is OK to shave your face.  Please follow these instructions carefully.   Shower the NIGHT BEFORE SURGERY and the MORNING OF SURGERY  If you chose to wash your hair, wash your hair first as usual with your normal shampoo.  After you shampoo, rinse your hair and body thoroughly to remove the shampoo.  Use CHG Soap as you would any other liquid soap. You can apply CHG directly to the skin and wash gently with a scrungie or a clean washcloth.   Apply the CHG Soap to your body ONLY FROM THE NECK DOWN.  Do not use on open wounds or open sores. Avoid contact with your eyes, ears, mouth and genitals (private parts). Wash Face and genitals (private parts)  with your normal soap.   Wash thoroughly, paying special attention to the area where your surgery will be performed.  Thoroughly rinse your body with warm water from the neck down.  DO NOT shower/wash with your normal soap after using and rinsing off the CHG Soap.  Pat yourself dry with a CLEAN TOWEL.  Wear CLEAN PAJAMAS to bed the night before surgery  Place CLEAN SHEETS on your bed the night before your surgery  DO NOT SLEEP WITH PETS.   Day of Surgery: Shower with CHG soap. Do not wear jewelry. Do not wear lotions, powders, colognes, or deodorant. Men may shave face and  neck. Do not bring valuables to the hospital. [facility] is not responsible for any belongings or valuables. Wear Clean/Comfortable clothing the morning of surgery Remember to brush your teeth WITH YOUR REGULAR TOOTHPASTE.   Please read over the following fact sheets that you were given.

## 2021-04-19 ENCOUNTER — Encounter (HOSPITAL_COMMUNITY)
Admission: RE | Admit: 2021-04-19 | Discharge: 2021-04-19 | Disposition: A | Discharge status: Home / Self Care | Payer: Medicare PPO | Source: Ambulatory Visit | Attending: Thoracic Surgery (Cardiothoracic Vascular Surgery) | Admitting: Thoracic Surgery (Cardiothoracic Vascular Surgery)

## 2021-04-19 ENCOUNTER — Other Ambulatory Visit: Payer: Self-pay

## 2021-04-19 ENCOUNTER — Encounter (HOSPITAL_COMMUNITY): Payer: Self-pay

## 2021-04-19 ENCOUNTER — Ambulatory Visit (HOSPITAL_COMMUNITY)
Admission: RE | Admit: 2021-04-19 | Discharge: 2021-04-19 | Disposition: A | Discharge status: Home / Self Care | Payer: Medicare PPO | Source: Ambulatory Visit | Attending: Thoracic Surgery (Cardiothoracic Vascular Surgery) | Admitting: Thoracic Surgery (Cardiothoracic Vascular Surgery)

## 2021-04-19 DIAGNOSIS — Z7982 Long term (current) use of aspirin: Secondary | ICD-10-CM | POA: Diagnosis not present

## 2021-04-19 DIAGNOSIS — J939 Pneumothorax, unspecified: Secondary | ICD-10-CM | POA: Diagnosis present

## 2021-04-19 DIAGNOSIS — J439 Emphysema, unspecified: Secondary | ICD-10-CM | POA: Diagnosis not present

## 2021-04-19 DIAGNOSIS — I119 Hypertensive heart disease without heart failure: Secondary | ICD-10-CM | POA: Insufficient documentation

## 2021-04-19 DIAGNOSIS — I700 Atherosclerosis of aorta: Secondary | ICD-10-CM | POA: Diagnosis not present

## 2021-04-19 DIAGNOSIS — D630 Anemia in neoplastic disease: Secondary | ICD-10-CM | POA: Diagnosis not present

## 2021-04-19 DIAGNOSIS — C3412 Malignant neoplasm of upper lobe, left bronchus or lung: Secondary | ICD-10-CM | POA: Diagnosis present

## 2021-04-19 DIAGNOSIS — I252 Old myocardial infarction: Secondary | ICD-10-CM | POA: Diagnosis not present

## 2021-04-19 DIAGNOSIS — C3492 Malignant neoplasm of unspecified part of left bronchus or lung: Secondary | ICD-10-CM | POA: Insufficient documentation

## 2021-04-19 DIAGNOSIS — I2510 Atherosclerotic heart disease of native coronary artery without angina pectoris: Secondary | ICD-10-CM | POA: Diagnosis present

## 2021-04-19 DIAGNOSIS — I082 Rheumatic disorders of both aortic and tricuspid valves: Secondary | ICD-10-CM | POA: Insufficient documentation

## 2021-04-19 DIAGNOSIS — S2231XA Fracture of one rib, right side, initial encounter for closed fracture: Secondary | ICD-10-CM | POA: Diagnosis not present

## 2021-04-19 DIAGNOSIS — K219 Gastro-esophageal reflux disease without esophagitis: Secondary | ICD-10-CM | POA: Diagnosis present

## 2021-04-19 DIAGNOSIS — Z9221 Personal history of antineoplastic chemotherapy: Secondary | ICD-10-CM | POA: Diagnosis not present

## 2021-04-19 DIAGNOSIS — Z7951 Long term (current) use of inhaled steroids: Secondary | ICD-10-CM | POA: Diagnosis not present

## 2021-04-19 DIAGNOSIS — Z79899 Other long term (current) drug therapy: Secondary | ICD-10-CM | POA: Diagnosis not present

## 2021-04-19 DIAGNOSIS — Z01812 Encounter for preprocedural laboratory examination: Secondary | ICD-10-CM | POA: Insufficient documentation

## 2021-04-19 DIAGNOSIS — J449 Chronic obstructive pulmonary disease, unspecified: Secondary | ICD-10-CM | POA: Diagnosis present

## 2021-04-19 DIAGNOSIS — I10 Essential (primary) hypertension: Secondary | ICD-10-CM | POA: Diagnosis present

## 2021-04-19 DIAGNOSIS — Z7952 Long term (current) use of systemic steroids: Secondary | ICD-10-CM | POA: Diagnosis not present

## 2021-04-19 DIAGNOSIS — J984 Other disorders of lung: Secondary | ICD-10-CM

## 2021-04-19 DIAGNOSIS — Z20822 Contact with and (suspected) exposure to covid-19: Secondary | ICD-10-CM | POA: Diagnosis present

## 2021-04-19 DIAGNOSIS — Z01818 Encounter for other preprocedural examination: Secondary | ICD-10-CM

## 2021-04-19 DIAGNOSIS — C771 Secondary and unspecified malignant neoplasm of intrathoracic lymph nodes: Secondary | ICD-10-CM | POA: Diagnosis present

## 2021-04-19 DIAGNOSIS — F17210 Nicotine dependence, cigarettes, uncomplicated: Secondary | ICD-10-CM | POA: Diagnosis present

## 2021-04-19 DIAGNOSIS — E876 Hypokalemia: Secondary | ICD-10-CM | POA: Diagnosis not present

## 2021-04-19 DIAGNOSIS — E785 Hyperlipidemia, unspecified: Secondary | ICD-10-CM | POA: Diagnosis present

## 2021-04-19 DIAGNOSIS — T797XXA Traumatic subcutaneous emphysema, initial encounter: Secondary | ICD-10-CM | POA: Diagnosis not present

## 2021-04-19 DIAGNOSIS — K7010 Alcoholic hepatitis without ascites: Secondary | ICD-10-CM | POA: Diagnosis present

## 2021-04-19 DIAGNOSIS — J9811 Atelectasis: Secondary | ICD-10-CM | POA: Diagnosis not present

## 2021-04-19 DIAGNOSIS — Y838 Other surgical procedures as the cause of abnormal reaction of the patient, or of later complication, without mention of misadventure at the time of the procedure: Secondary | ICD-10-CM | POA: Diagnosis not present

## 2021-04-19 LAB — SARS CORONAVIRUS 2 (TAT 6-24 HRS): SARS Coronavirus 2: NEGATIVE

## 2021-04-19 LAB — URINALYSIS, ROUTINE W REFLEX MICROSCOPIC
Bilirubin Urine: NEGATIVE
Glucose, UA: NEGATIVE mg/dL
Hgb urine dipstick: NEGATIVE
Ketones, ur: NEGATIVE mg/dL
Leukocytes,Ua: NEGATIVE
Nitrite: NEGATIVE
Protein, ur: NEGATIVE mg/dL
Specific Gravity, Urine: 1.012 (ref 1.005–1.030)
pH: 5 (ref 5.0–8.0)

## 2021-04-19 LAB — COMPREHENSIVE METABOLIC PANEL
ALT: 34 U/L (ref 0–44)
AST: 75 U/L — ABNORMAL HIGH (ref 15–41)
Albumin: 3.6 g/dL (ref 3.5–5.0)
Alkaline Phosphatase: 99 U/L (ref 38–126)
Anion gap: 13 (ref 5–15)
BUN: <5 mg/dL — ABNORMAL LOW (ref 6–20)
CO2: 23 mmol/L (ref 22–32)
Calcium: 9.5 mg/dL (ref 8.9–10.3)
Chloride: 102 mmol/L (ref 98–111)
Creatinine, Ser: 0.56 mg/dL — ABNORMAL LOW (ref 0.61–1.24)
GFR, Estimated: >60 mL/min (ref >60)
Glucose, Bld: 120 mg/dL — ABNORMAL HIGH (ref 70–99)
Potassium: 3.5 mmol/L (ref 3.5–5.1)
Sodium: 138 mmol/L (ref 135–145)
Total Bilirubin: 0.5 mg/dL (ref 0.3–1.2)
Total Protein: 7.8 g/dL (ref 6.5–8.1)

## 2021-04-19 LAB — BLOOD GAS, ARTERIAL
Acid-Base Excess: 2 mmol/L (ref 0.0–2.0)
Bicarbonate: 25.6 mmol/L (ref 20.0–28.0)
Drawn by: 58793
FIO2: 21
O2 Saturation: 96.3 %
Patient temperature: 37
pCO2 arterial: 37.5 mmHg (ref 32.0–48.0)
pH, Arterial: 7.449 (ref 7.350–7.450)
pO2, Arterial: 77.2 mmHg — ABNORMAL LOW (ref 83.0–108.0)

## 2021-04-19 LAB — CBC
HCT: 40.4 % (ref 39.0–52.0)
Hemoglobin: 13.6 g/dL (ref 13.0–17.0)
MCH: 32.2 pg (ref 26.0–34.0)
MCHC: 33.7 g/dL (ref 30.0–36.0)
MCV: 95.5 fL (ref 80.0–100.0)
Platelets: 306 10*3/uL (ref 150–400)
RBC: 4.23 MIL/uL (ref 4.22–5.81)
RDW: 20.4 % — ABNORMAL HIGH (ref 11.5–15.5)
WBC: 8.1 10*3/uL (ref 4.0–10.5)
nRBC: 0 % (ref 0.0–0.2)

## 2021-04-19 LAB — TYPE AND SCREEN
ABO/RH(D): O POS
Antibody Screen: NEGATIVE

## 2021-04-19 LAB — SURGICAL PCR SCREEN
MRSA, PCR: NEGATIVE
Staphylococcus aureus: NEGATIVE

## 2021-04-19 LAB — PROTIME-INR
INR: 1 (ref 0.8–1.2)
Prothrombin Time: 12.6 seconds (ref 11.4–15.2)

## 2021-04-19 LAB — APTT: aPTT: 29 seconds (ref 24–36)

## 2021-04-19 IMAGING — CR DG CHEST 2V
2 series · 2 of 2 positions shown · non-contrast
Comparison: [DATE] CT and [DATE] plain film.

CLINICAL DATA: Cavitating mass in left upper lobe.  Preop.

EXAM:
CHEST - 2 VIEW

[w chest pa]
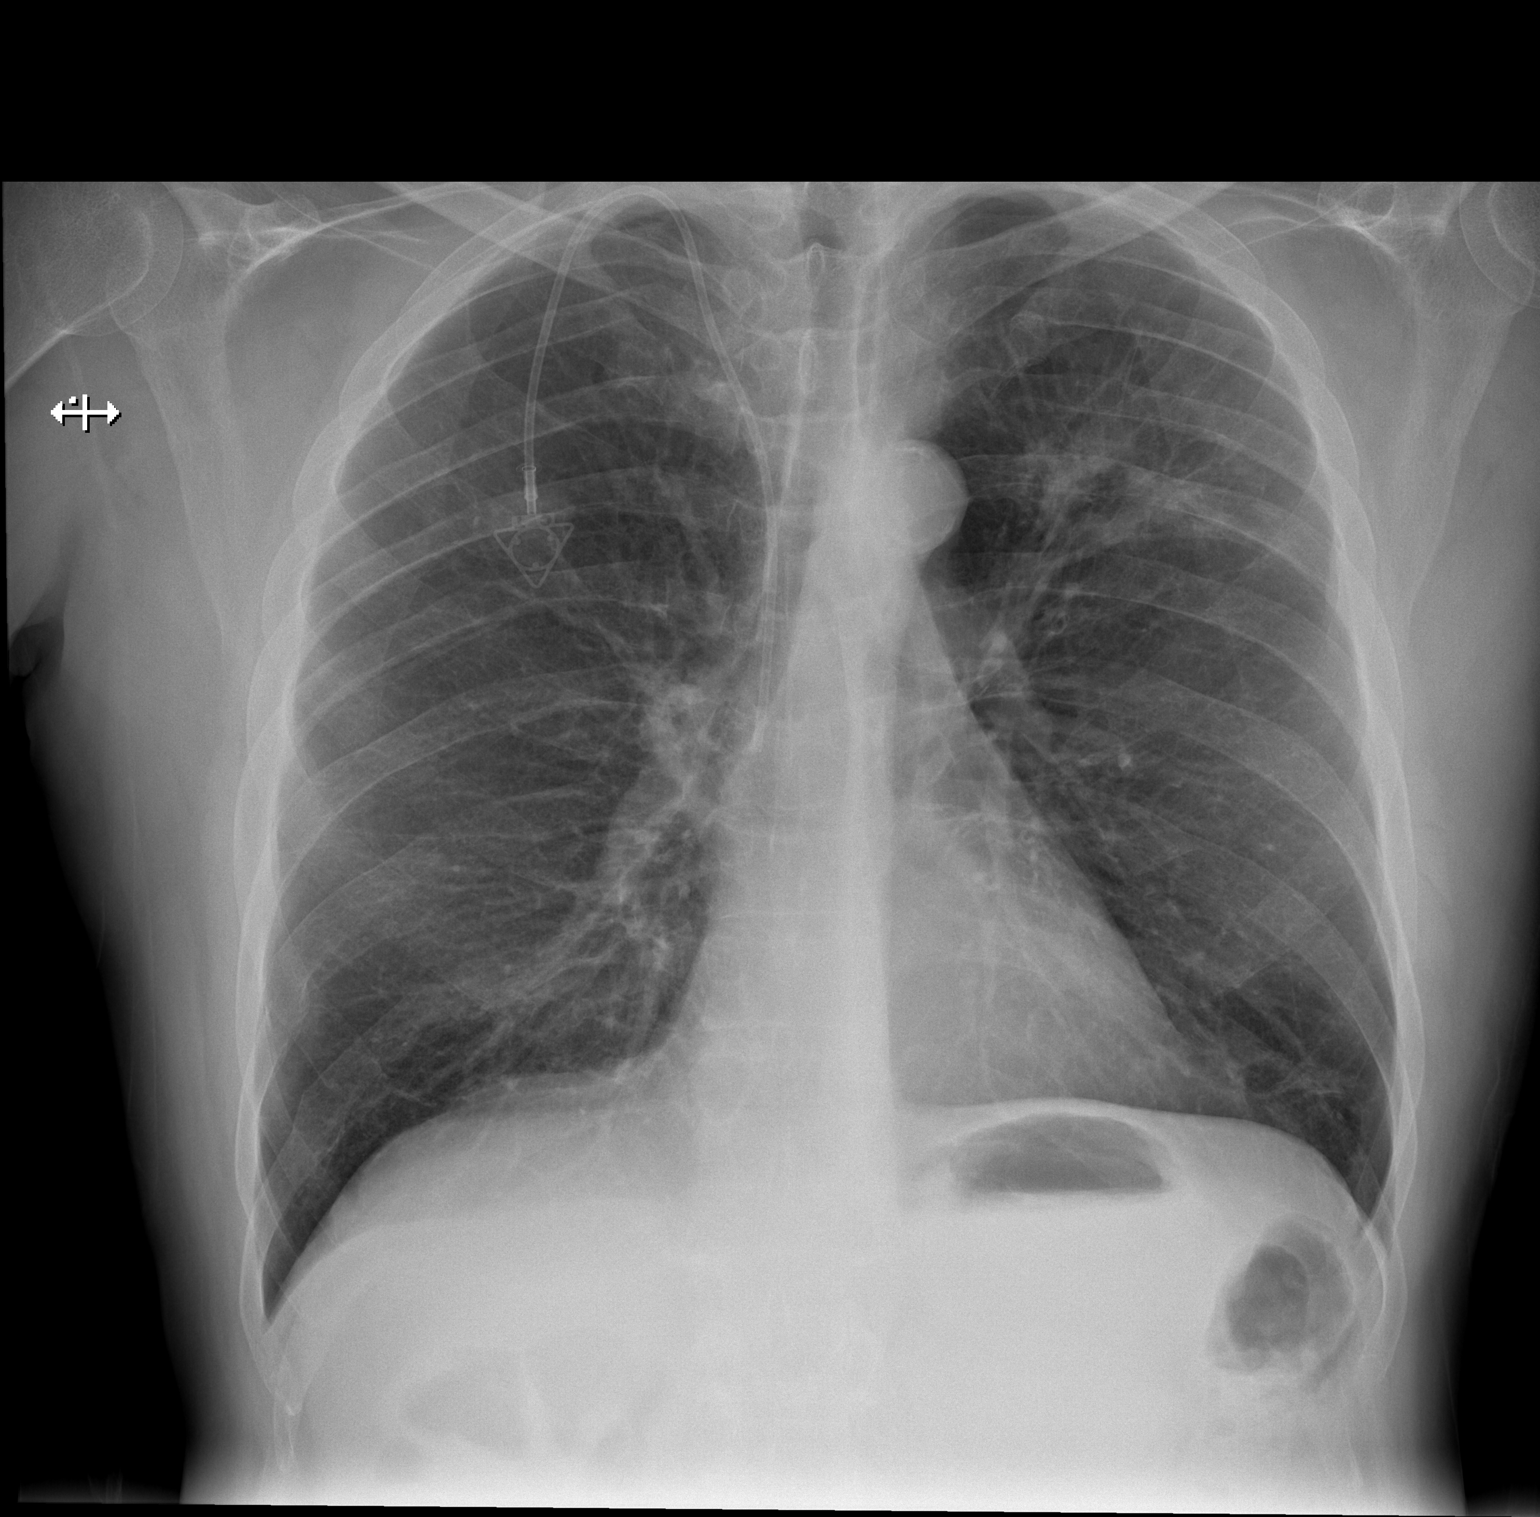

[w chest lat]
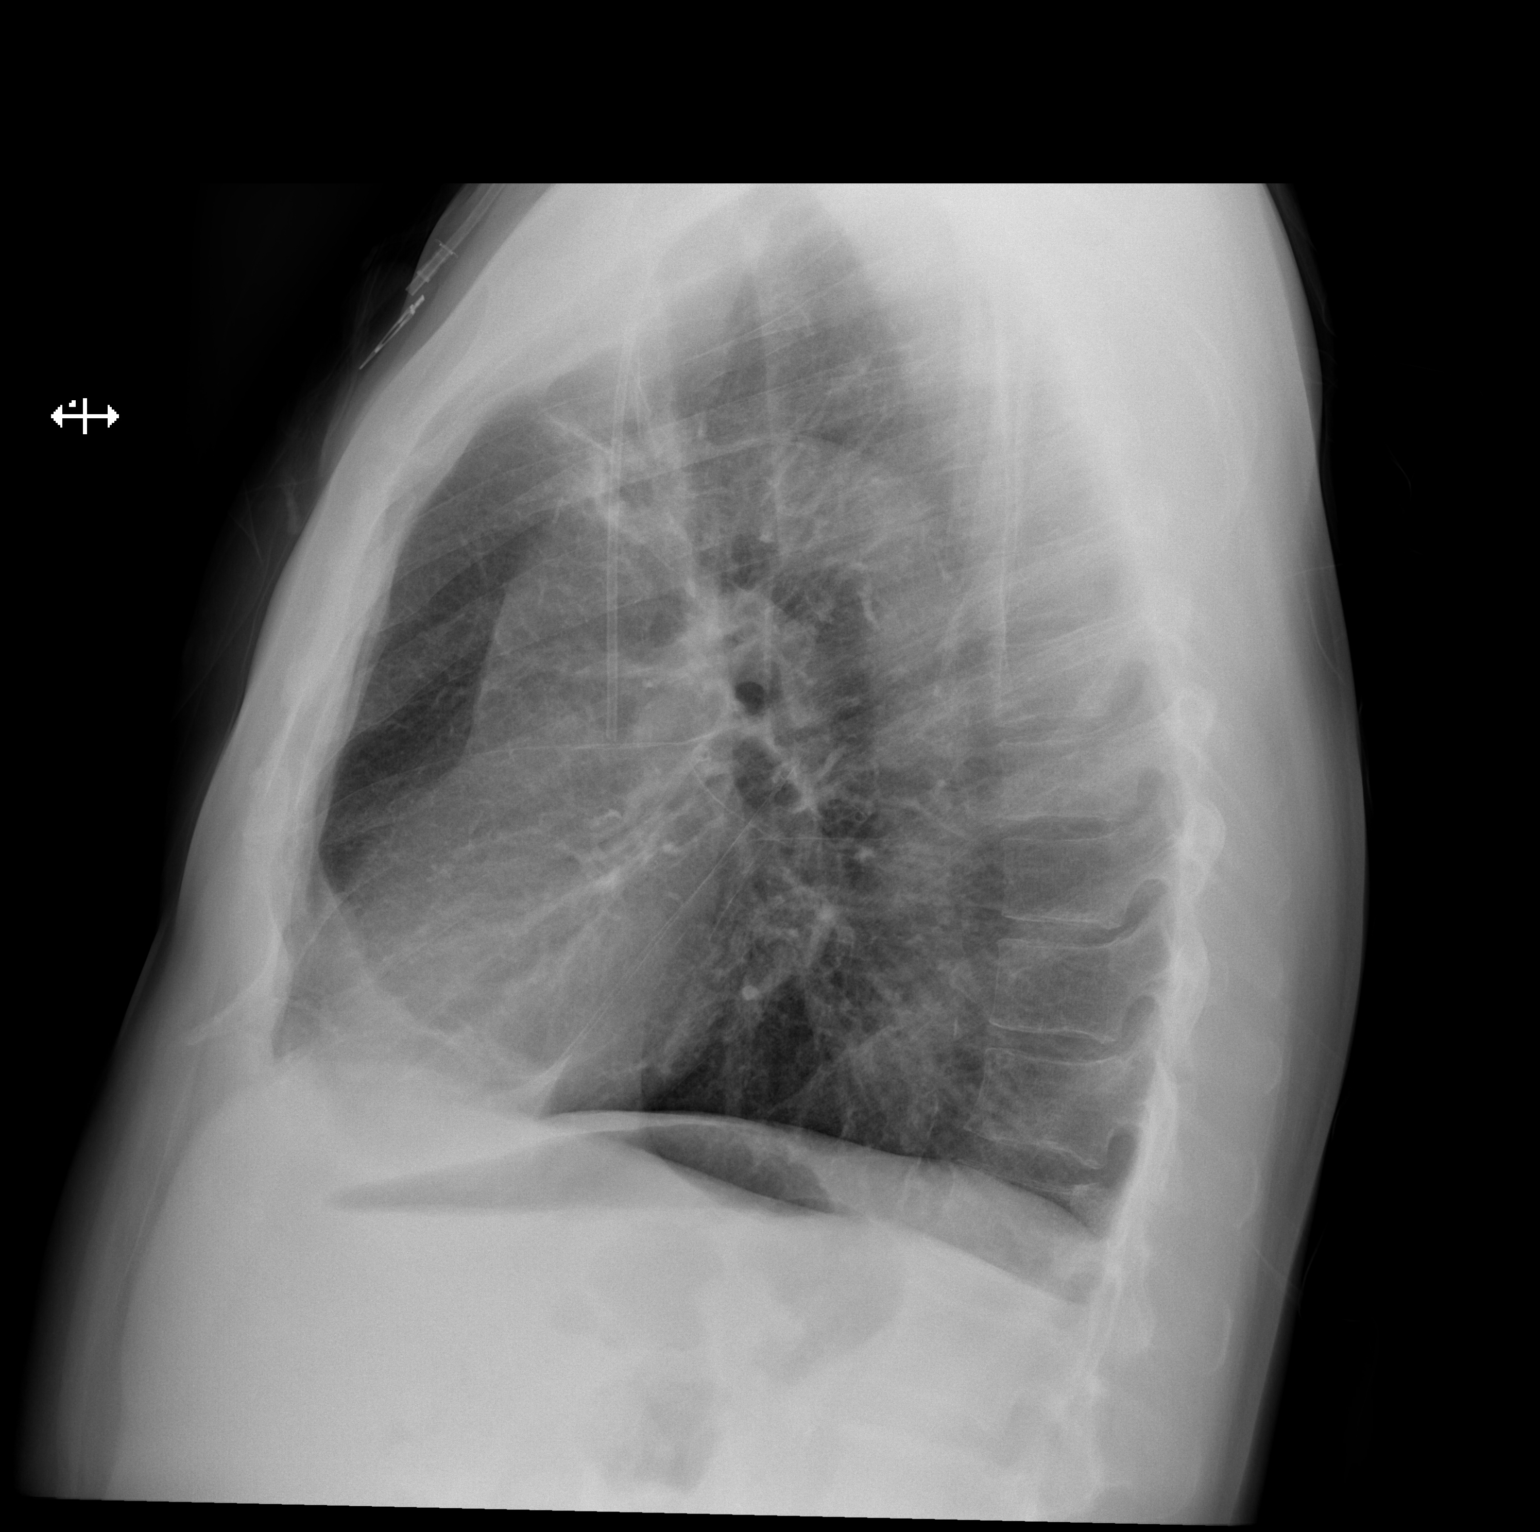

[2 of 2 positions shown; findings below may reference images not displayed]

FINDINGS: Right Port-A-Cath tip at superior caval/atrial junction. Remote
right rib fractures. Midline trachea. Normal heart size.
Atherosclerosis in the transverse aorta. No pleural effusion or
pneumothorax. Decrease in left upper lobe cavitary mass compared to
plain film of [DATE]. On the order of 3.7 cm versus 5.1 cm.
Clear right lung.
IMPRESSION: Improvement left upper lobe lung mass since prior plain film of
[DATE].

No acute superimposed process.

Aortic Atherosclerosis ([SX]-[SX]).

## 2021-04-19 NOTE — Progress Notes (Signed)
PCP - Dr. Rosita Fire Cardiologist - Dr. Johnsie Cancel Pulm/Onc: Dr. Delton Coombes  Chest x-ray - 04/19/21 EKG - 04/19/21 Stress Test - 04/01/21 ECHO - 04/06/21 Cardiac Cath - 2010  Sleep Study - denies CPAP- denies  Blood Thinner Instructions: n/a Aspirin Instructions: n/a  COVID TEST- 04/19/21; done in PAT  Anesthesia review: Yes, tachycardia at PAT appointment. EKG showed ST otherwise normal. Karoline Caldwell, PA-C made aware. Chart sent to f/u. Pt has had full cardiac work-up and cleared to have surgery. No distress noted at  PAT appointment.    Patient denies shortness of breath, fever, cough and chest pain at PAT appointment   All instructions explained to the patient, with a verbal understanding of the material. Patient agrees to go over the instructions while at home for a better understanding. Patient also instructed to self quarantine after being tested for COVID-19. The opportunity to ask questions was provided.

## 2021-04-20 NOTE — Progress Notes (Signed)
Anesthesia Chart Review:  Pertinent history includes tobacco abuse, COPD, ethanol abuse (reportedly ~5 beers and a quart of liquor daily), alcoholic pancreatitis, alcoholic hepatitis, hypertension, hyperlipidemia, reflux, and coronary calcification on CT.  He was found to have a lung mass on the low-dose screening CT.  PET/CT showed the mass was hypermetabolic and there also was a suspicious AP window lymph node.  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  He was treated with neoadjuvant chemotherapy with 3 cycles of carboplatin and paclitaxel.  He then was started on nivolumab.  A follow-up CT showed a good partial response.  Patient had preop cardiac evaluation by Dr. Johnsie Cancel on 03/15/2021.  Per note, "Preoperative:  Stage 3A lung cancer LUL with poor lifestyle habits including ongoing smoking and ETOH abuse CT chest showing LM and 3 vessel coronary calcification. Marginal candidate for surgery as it is will risk stratify with exercise  myovue He was able to walk fairly well in clinic today would used modified Bruce protocol or Naughton. 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  Subsequent echo 04/06/2021 showed EF >75%, moderate LVH, increased LVOT gradient with Valsalva (approximately 2 to 3 m/s), no significant valvular abnormalities.  Dr. 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 Pulse Readings from Last 3 Encounters:  04/19/21 (!) 124  04/08/21 (!) 122  03/17/21 (!) 120    Preop labs reviewed, AST mildly elevated at 75, otherwise unremarkable.  EKG 04/19/2021: Sinus tachycardia.  Rate 119.  PFT  12/08/2020: FVC-%Pred-Pre Latest Units: % 77  FEV1-%Pred-Pre Latest Units: % 56  FEV1FVC-%Pred-Pre Latest Units: % 72  TLC % pred Latest Units: % 135  RV % pred Latest Units: % 270  DLCO unc % pred Latest Units: % 41    CHEST - 2 VIEW 04/19/2021: COMPARISON:  03/15/2021 CT and 12/23/2020 plain film.   FINDINGS: Right Port-A-Cath tip at superior caval/atrial junction. Remote right rib fractures. Midline trachea. Normal heart size. Atherosclerosis in the transverse aorta. No pleural effusion or pneumothorax. Decrease in left upper lobe cavitary mass compared to plain film of 12/23/2020. On the order of 3.7 cm versus 5.1 cm. Clear right lung.   IMPRESSION: Improvement left upper lobe lung mass since prior plain film of 12/23/2020.   No acute superimposed process.   Aortic Atherosclerosis (ICD10-I70.0).  TTE 04/06/21:  1. Left ventricular ejection fraction, by estimation, is >75%. The left  ventricle has hyperdynamic function. The left ventricle has no regional  wall motion abnormalities. There is moderate left ventricular hypertrophy.  Left ventricular diastolic  parameters are indeterminate. Increased LVOT gradient with Valsalva,  approximately 2-3 m/s.   2. Right ventricular systolic function is normal. The right ventricular  size is normal. Tricuspid regurgitation signal is inadequate for assessing  PA pressure.   3. The mitral valve is grossly normal. Trivial mitral valve  regurgitation.   4. The aortic valve is tricuspid. There is mild calcification of the  aortic valve. Aortic valve regurgitation is mild.   5. The inferior vena cava is normal in size with greater than 50%  respiratory variability, suggesting right atrial pressure of 3 mmHg.  Comparison(s): No prior Echocardiogram.   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 The study is low risk.   No ST deviation was noted.    Defect 1: There is a medium defect with moderate reduction in uptake present in the apical to basal inferior location(s) with very minimal reversibility.   Left ventricular function is normal.   Achieved METs not representative of true cardiopulmonary functional capacity, exercise duration was limited by joint pain. In this setting Duke treadmill score was not calculated    Wynonia Musty [facility]/Anesthesiology Phone [phone] 04/20/2021 10:32 AM

## 2021-04-20 NOTE — Anesthesia Preprocedure Evaluation (Addendum)
Anesthesia Evaluation  Patient identified by MRN, date of birth, ID band Patient awake    Reviewed: Allergy & Precautions, NPO status , Patient's Chart, lab work & pertinent test results  History of Anesthesia Complications Negative for: history of anesthetic complications  Airway Mallampati: II  TM Distance: >3 FB Neck ROM: Full    Dental  (+) Dental Advisory Given   Pulmonary shortness of breath, COPD,  COPD inhaler, Current Smoker,    breath sounds clear to auscultation       Cardiovascular hypertension, Pt. on medications (-) angina(-) CHF  Rhythm:Regular  1. Left ventricular ejection fraction, by estimation, is >75%. The left  ventricle has hyperdynamic function. The left ventricle has no regional  wall motion abnormalities. There is moderate left ventricular hypertrophy.  Left ventricular diastolic  parameters are indeterminate. Increased LVOT gradient with Valsalva,  approximately 2-3 m/s.  2. Right ventricular systolic function is normal. The right ventricular  size is normal. Tricuspid regurgitation signal is inadequate for assessing  PA pressure.  3. The mitral valve is grossly normal. Trivial mitral valve  regurgitation.  4. The aortic valve is tricuspid. There is mild calcification of the  aortic valve. Aortic valve regurgitation is mild.  5. The inferior vena cava is normal in size with greater than 50%  respiratory variability, suggesting right atrial pressure of 3 mmHg.    Neuro/Psych PSYCHIATRIC DISORDERS Anxiety Depression  Neuromuscular disease    GI/Hepatic GERD  Medicated and Controlled,  Endo/Other  negative endocrine ROS  Renal/GU negative Renal ROS     Musculoskeletal negative musculoskeletal ROS (+)   Abdominal   Peds  Hematology negative hematology ROS (+) Lab Results      Component                Value               Date                      WBC                      8.1                  04/19/2021                HGB                      13.6                04/19/2021                HCT                      40.4                04/19/2021                MCV                      95.5                04/19/2021                PLT                      306  04/19/2021              Anesthesia Other Findings   Reproductive/Obstetrics                           Anesthesia Physical Anesthesia Plan  ASA: 2  Anesthesia Plan: General   Post-op Pain Management:    Induction: Intravenous  PONV Risk Score and Plan: 1 and Ondansetron and Dexamethasone  Airway Management Planned: Double Lumen EBT  Additional Equipment: Arterial line  Intra-op Plan:   Post-operative Plan: Extubation in OR  Informed Consent: I have reviewed the patients History and Physical, chart, labs and discussed the procedure including the risks, benefits and alternatives for the proposed anesthesia with the patient or authorized representative who has indicated his/her understanding and acceptance.     Dental advisory given  Plan Discussed with: CRNA and Anesthesiologist  Anesthesia Plan Comments: (PAT note by Karoline Caldwell, PA-C: Pertinent history includestobacco abuse, COPD, ethanol abuse (reportedly ~5 beers and a quart of liquor daily), alcoholic pancreatitis, alcoholic hepatitis, hypertension, hyperlipidemia, reflux, and coronary calcification on CT. He was found to have a lung mass on the low-dose screening CT. PET/CT showed the mass was hypermetabolic and there also was a suspicious AP window lymph node. Navigational bronchoscopy showed squamous cell carcinoma. Clinical stage was IIIA (T2b, N2). He was treated with neoadjuvant chemotherapy with 3 cycles of carboplatin and paclitaxel.He then was started on nivolumab. A follow-up CT showed a good partial response.  Patient had preop cardiac evaluation by Dr. Johnsie Cancel on 03/15/2021.  Per note, "Preoperative:   Stage 3A lung cancer LUL with poor lifestyle habits including ongoing smoking and ETOH abuse CT chest showing LM and 3 vessel coronary calcification. Marginal candidate for surgery as it is will risk stratify with exercise  myovue He was able to walk fairly well in clinic today would used modified Bruce protocol or Naughton. 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  Subsequent echo 04/06/2021 showed EF >75%, moderate LVH, increased LVOT gradient with Valsalva (approximately 2 to 3 m/s), no significant valvular abnormalities.  Dr. 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 Pulse Readings from Last 3 Encounters: 04/19/21 (!) 124 04/08/21 (!) 122 03/17/21 (!) 120   Preop labs reviewed, AST mildly elevated at 75, otherwise unremarkable.  EKG 04/19/2021: Sinus tachycardia.  Rate 119.  PFT 12/08/2020: FVC-%Pred-Pre Latest Units: % 77 FEV1-%Pred-Pre Latest Units: % 56 FEV1FVC-%Pred-Pre Latest Units: % 72 TLC % pred Latest Units: % 135 RV % pred Latest Units: % 270 DLCO unc % pred Latest Units: % 41   CHEST - 2 VIEW 04/19/2021: COMPARISON: 03/15/2021 CT and 12/23/2020 plain film.  FINDINGS: Right Port-A-Cath tip at superior caval/atrial junction. Remote right rib fractures. Midline trachea. Normal heart size. Atherosclerosis in the transverse aorta. No pleural effusion or pneumothorax. Decrease in left upper lobe cavitary mass compared to plain film of 12/23/2020. On the order of 3.7 cm versus 5.1 cm. Clear right lung.  IMPRESSION: Improvement left upper lobe lung mass since prior plain film of 12/23/2020.  No acute superimposed process.  Aortic  Atherosclerosis (ICD10-I70.0).  TTE 04/06/21: 1. Left  ventricular ejection fraction, by estimation, is >75%. The left  ventricle has hyperdynamic function. The left ventricle has no regional  wall motion abnormalities. There is moderate left ventricular hypertrophy.  Left ventricular diastolic  parameters are indeterminate. Increased LVOT gradient with Valsalva,  approximately 2-3 m/s.  2. Right ventricular systolic function is normal. The right ventricular  size is normal. Tricuspid regurgitation signal is inadequate for assessing  PA pressure.  3. The mitral valve is grossly normal. Trivial mitral valve  regurgitation.  4. The aortic valve is tricuspid. There is mild calcification of the  aortic valve. Aortic valve regurgitation is mild.  5. The inferior vena cava is normal in size with greater than 50%  respiratory variability, suggesting right atrial pressure of 3 mmHg.   Comparison(s): No prior Echocardiogram.   Nuclear stress 04/01/21: . Findings are consistent with prior inferior myocardial infarction with minimal peri-infarct ischemia. The study is low risk. . No ST deviation was noted. . Defect 1: There is a medium defect with moderate reduction in uptake present in the apical to basal inferior location(s) with very minimal reversibility. . Left ventricular function is normal. . Achieved METs not representative of true cardiopulmonary functional capacity, exercise duration was limited by joint pain. In this setting Duke treadmill score was not calculated   )       Anesthesia Quick Evaluation

## 2021-04-21 ENCOUNTER — Inpatient Hospital Stay (HOSPITAL_COMMUNITY): Payer: Medicare PPO

## 2021-04-21 ENCOUNTER — Encounter (HOSPITAL_COMMUNITY)
Admission: RE | Disposition: A | Discharge status: Home / Self Care | Payer: Self-pay | Source: Home / Self Care | Attending: Thoracic Surgery (Cardiothoracic Vascular Surgery)

## 2021-04-21 ENCOUNTER — Encounter (HOSPITAL_COMMUNITY): Payer: Self-pay | Admitting: Thoracic Surgery (Cardiothoracic Vascular Surgery)

## 2021-04-21 ENCOUNTER — Inpatient Hospital Stay (HOSPITAL_COMMUNITY)
Admission: RE | Admit: 2021-04-21 | Discharge: 2021-04-23 | DRG: 164 | Disposition: A | Discharge status: Home / Self Care | Payer: Medicare PPO | Attending: Thoracic Surgery (Cardiothoracic Vascular Surgery) | Admitting: Thoracic Surgery (Cardiothoracic Vascular Surgery)

## 2021-04-21 ENCOUNTER — Other Ambulatory Visit: Payer: Self-pay

## 2021-04-21 ENCOUNTER — Inpatient Hospital Stay (HOSPITAL_COMMUNITY): Payer: Medicare PPO | Admitting: Physician Assistant

## 2021-04-21 DIAGNOSIS — Z20822 Contact with and (suspected) exposure to covid-19: Secondary | ICD-10-CM | POA: Diagnosis present

## 2021-04-21 DIAGNOSIS — C771 Secondary and unspecified malignant neoplasm of intrathoracic lymph nodes: Secondary | ICD-10-CM | POA: Diagnosis present

## 2021-04-21 DIAGNOSIS — Z79899 Other long term (current) drug therapy: Secondary | ICD-10-CM

## 2021-04-21 DIAGNOSIS — E785 Hyperlipidemia, unspecified: Secondary | ICD-10-CM | POA: Diagnosis present

## 2021-04-21 DIAGNOSIS — Z7952 Long term (current) use of systemic steroids: Secondary | ICD-10-CM | POA: Diagnosis not present

## 2021-04-21 DIAGNOSIS — Z7982 Long term (current) use of aspirin: Secondary | ICD-10-CM | POA: Diagnosis not present

## 2021-04-21 DIAGNOSIS — Z4682 Encounter for fitting and adjustment of non-vascular catheter: Secondary | ICD-10-CM

## 2021-04-21 DIAGNOSIS — K219 Gastro-esophageal reflux disease without esophagitis: Secondary | ICD-10-CM | POA: Diagnosis present

## 2021-04-21 DIAGNOSIS — I2510 Atherosclerotic heart disease of native coronary artery without angina pectoris: Secondary | ICD-10-CM | POA: Diagnosis present

## 2021-04-21 DIAGNOSIS — J984 Other disorders of lung: Secondary | ICD-10-CM

## 2021-04-21 DIAGNOSIS — J939 Pneumothorax, unspecified: Secondary | ICD-10-CM | POA: Diagnosis present

## 2021-04-21 DIAGNOSIS — Z7951 Long term (current) use of inhaled steroids: Secondary | ICD-10-CM | POA: Diagnosis not present

## 2021-04-21 DIAGNOSIS — C3412 Malignant neoplasm of upper lobe, left bronchus or lung: Principal | ICD-10-CM | POA: Diagnosis present

## 2021-04-21 DIAGNOSIS — Z902 Acquired absence of lung [part of]: Secondary | ICD-10-CM

## 2021-04-21 DIAGNOSIS — J449 Chronic obstructive pulmonary disease, unspecified: Secondary | ICD-10-CM | POA: Diagnosis present

## 2021-04-21 DIAGNOSIS — I10 Essential (primary) hypertension: Secondary | ICD-10-CM | POA: Diagnosis present

## 2021-04-21 DIAGNOSIS — T797XXA Traumatic subcutaneous emphysema, initial encounter: Secondary | ICD-10-CM | POA: Diagnosis not present

## 2021-04-21 DIAGNOSIS — I252 Old myocardial infarction: Secondary | ICD-10-CM | POA: Diagnosis not present

## 2021-04-21 DIAGNOSIS — Y838 Other surgical procedures as the cause of abnormal reaction of the patient, or of later complication, without mention of misadventure at the time of the procedure: Secondary | ICD-10-CM | POA: Diagnosis not present

## 2021-04-21 DIAGNOSIS — Z9221 Personal history of antineoplastic chemotherapy: Secondary | ICD-10-CM

## 2021-04-21 DIAGNOSIS — K7010 Alcoholic hepatitis without ascites: Secondary | ICD-10-CM | POA: Diagnosis present

## 2021-04-21 DIAGNOSIS — F17210 Nicotine dependence, cigarettes, uncomplicated: Secondary | ICD-10-CM | POA: Diagnosis present

## 2021-04-21 DIAGNOSIS — R918 Other nonspecific abnormal finding of lung field: Secondary | ICD-10-CM | POA: Diagnosis present

## 2021-04-21 HISTORY — PX: VIDEO ASSISTED THORACOSCOPY (VATS)/ LOBECTOMY: SHX6169

## 2021-04-21 HISTORY — PX: INTERCOSTAL NERVE BLOCK: SHX5021

## 2021-04-21 IMAGING — DX DG CHEST 1V PORT
1 series · 1 of 1 positions shown · non-contrast
Comparison: Chest x-ray [DATE], CT chest [DATE]

CLINICAL DATA: Postop film floor left VATS. check support apparatus

EXAM:
PORTABLE CHEST 1 VIEW.  Patient is rotated.

[chest]
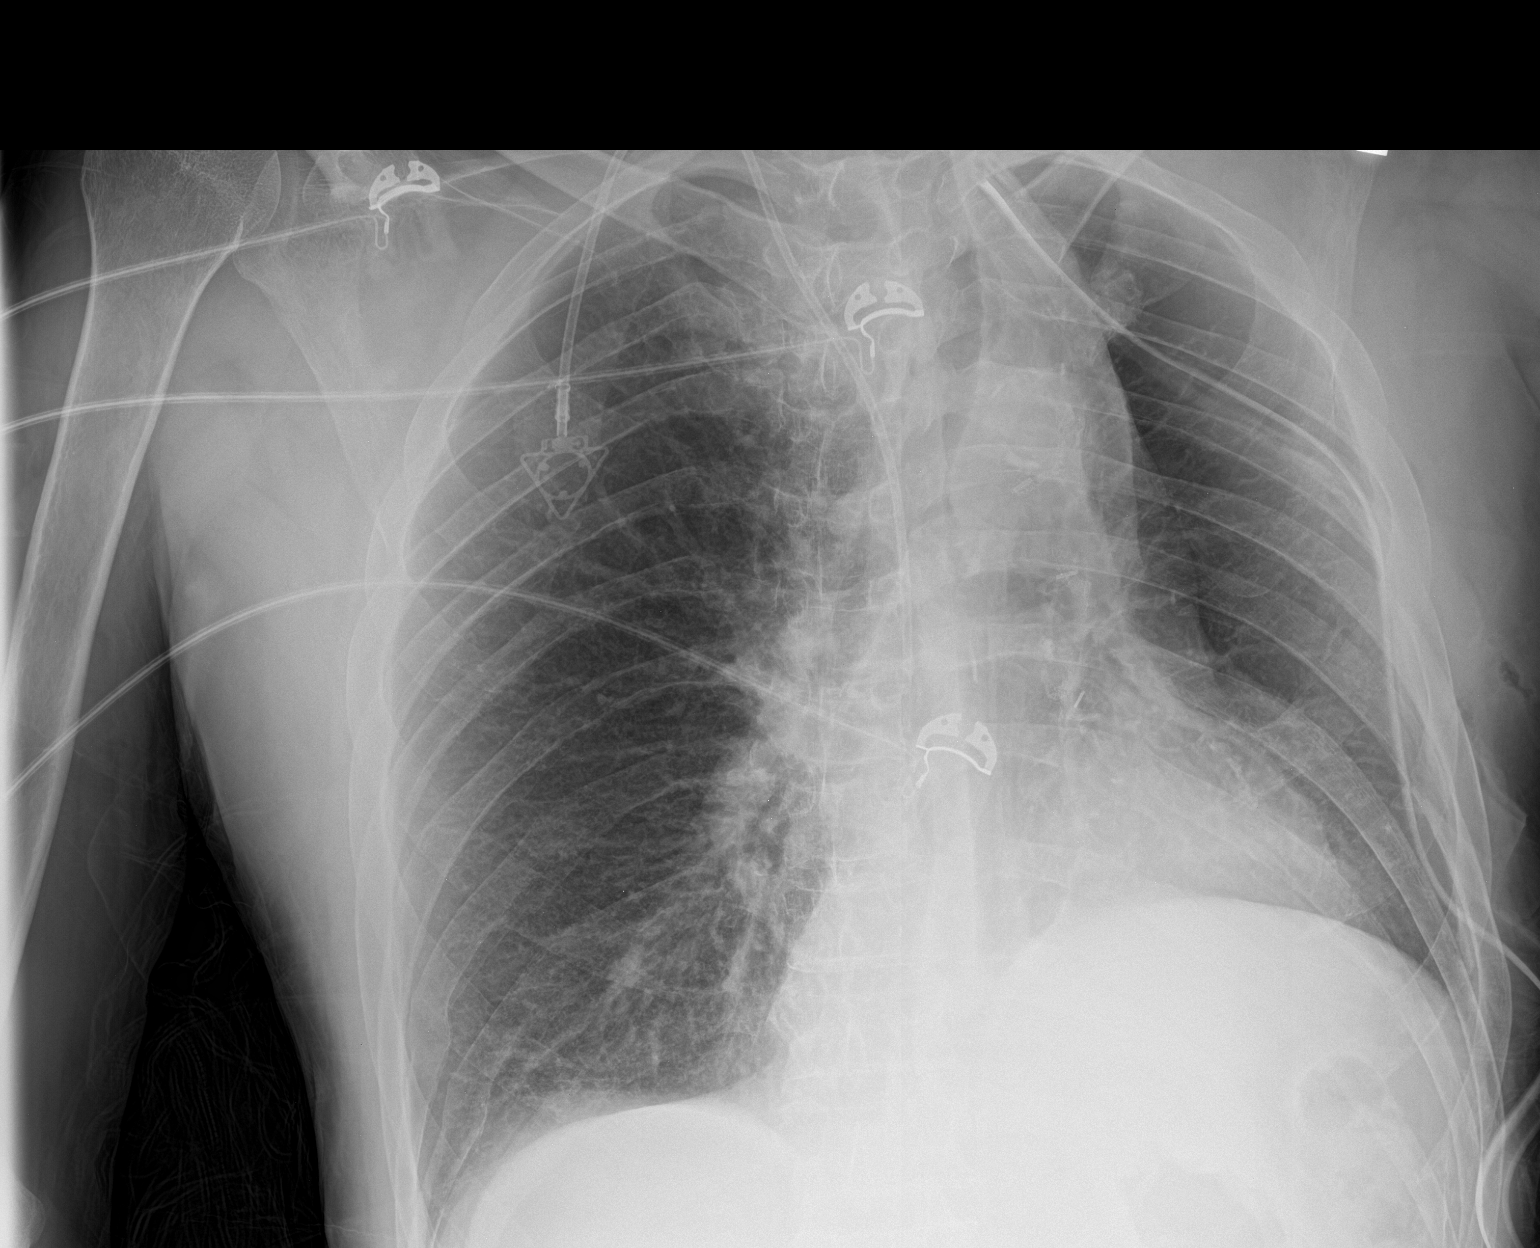

[1 of 1 positions shown; findings below may reference images not displayed]

FINDINGS: Right chest wall Port-A-Cath with tip overlying the expected region
of the superior cavoatrial junction. Status post left chest tube
overlying the left hemithorax with tip at the left apex. Surgical
clips and pulmonary staples noted overlying the left hemithorax.

The heart and mediastinal contours are within normal limits.
Expected right to left mediastinal shift and elevated left
hemidiaphragm. Slight prominence of the right hilar vasculature.

Trace left lower lobe atelectasis. No focal consolidation. No
pulmonary edema. No pleural effusion. No significant pneumothorax
identified.

No acute osseous abnormality.  Old healed right rib fractures.

Mild left chest wall subcutaneus soft tissue edema.
IMPRESSION: Left chest tube status post left upper lobectomy. No significant
pneumothorax identified.

## 2021-04-21 SURGERY — VIDEO ASSISTED THORACOSCOPY (VATS)/ LOBECTOMY
Anesthesia: General | Site: Chest | Laterality: Left

## 2021-04-21 MED ORDER — CEFAZOLIN SODIUM-DEXTROSE 2-4 GM/100ML-% IV SOLN
2.0000 g | INTRAVENOUS | Status: AC
  Administered 2021-04-21: 2 g via INTRAVENOUS
  Filled 2021-04-21: qty 100

## 2021-04-21 MED ORDER — CHLORHEXIDINE GLUCONATE 0.12 % MT SOLN
15.0000 mL | Freq: Once | OROMUCOSAL | Status: AC
  Administered 2021-04-21: 15 mL via OROMUCOSAL
  Filled 2021-04-21: qty 15

## 2021-04-21 MED ORDER — ALBUTEROL SULFATE (2.5 MG/3ML) 0.083% IN NEBU: 3.0000 mL | INHALATION_SOLUTION | Freq: Four times a day (QID) | RESPIRATORY_TRACT | Status: DC | PRN

## 2021-04-21 MED ORDER — FENTANYL CITRATE (PF) 250 MCG/5ML IJ SOLN
INTRAMUSCULAR | Status: DC | PRN
  Administered 2021-04-21 (×3): 50 ug via INTRAVENOUS
  Administered 2021-04-21: 100 ug via INTRAVENOUS

## 2021-04-21 MED ORDER — MIDAZOLAM HCL 2 MG/2ML IJ SOLN
INTRAMUSCULAR | Status: AC
  Filled 2021-04-21: qty 2

## 2021-04-21 MED ORDER — DEXAMETHASONE SODIUM PHOSPHATE 10 MG/ML IJ SOLN
INTRAMUSCULAR | Status: DC | PRN
  Administered 2021-04-21: 10 mg via INTRAVENOUS

## 2021-04-21 MED ORDER — ONDANSETRON HCL 4 MG/2ML IJ SOLN
INTRAMUSCULAR | Status: DC | PRN
  Administered 2021-04-21: 4 mg via INTRAVENOUS

## 2021-04-21 MED ORDER — GABAPENTIN 300 MG PO CAPS
300.0000 mg | ORAL_CAPSULE | Freq: Three times a day (TID) | ORAL | Status: DC
  Administered 2021-04-22 – 2021-04-23 (×5): 300 mg via ORAL
  Filled 2021-04-21 (×5): qty 1

## 2021-04-21 MED ORDER — 0.9 % SODIUM CHLORIDE (POUR BTL) OPTIME
TOPICAL | Status: DC | PRN
  Administered 2021-04-21: 1000 mL

## 2021-04-21 MED ORDER — OXYCODONE HCL 5 MG PO TABS
10.0000 mg | ORAL_TABLET | ORAL | Status: DC | PRN
  Administered 2021-04-22 – 2021-04-23 (×7): 10 mg via ORAL
  Filled 2021-04-21 (×7): qty 2

## 2021-04-21 MED ORDER — KETOROLAC TROMETHAMINE 15 MG/ML IJ SOLN
15.0000 mg | Freq: Four times a day (QID) | INTRAMUSCULAR | Status: DC
  Administered 2021-04-21 – 2021-04-23 (×7): 15 mg via INTRAVENOUS
  Filled 2021-04-21 (×7): qty 1

## 2021-04-21 MED ORDER — PROPOFOL 10 MG/ML IV BOLUS
INTRAVENOUS | Status: AC
  Filled 2021-04-21: qty 20

## 2021-04-21 MED ORDER — AMLODIPINE BESYLATE 5 MG PO TABS
5.0000 mg | ORAL_TABLET | Freq: Every day | ORAL | Status: DC
  Administered 2021-04-22 – 2021-04-23 (×2): 5 mg via ORAL
  Filled 2021-04-21 (×3): qty 1

## 2021-04-21 MED ORDER — MORPHINE SULFATE (PF) 2 MG/ML IV SOLN
2.0000 mg | INTRAVENOUS | Status: DC | PRN
  Filled 2021-04-21: qty 1

## 2021-04-21 MED ORDER — ENOXAPARIN SODIUM 40 MG/0.4ML IJ SOSY
40.0000 mg | PREFILLED_SYRINGE | Freq: Every day | INTRAMUSCULAR | Status: DC
  Administered 2021-04-22 – 2021-04-23 (×2): 40 mg via SUBCUTANEOUS
  Filled 2021-04-21 (×2): qty 0.4

## 2021-04-21 MED ORDER — FENTANYL CITRATE (PF) 100 MCG/2ML IJ SOLN: 25.0000 ug | INTRAMUSCULAR | Status: DC | PRN

## 2021-04-21 MED ORDER — LABETALOL HCL 5 MG/ML IV SOLN
INTRAVENOUS | Status: DC | PRN
  Administered 2021-04-21: 5 mg via INTRAVENOUS

## 2021-04-21 MED ORDER — LACTATED RINGERS IV SOLN: INTRAVENOUS | Status: DC

## 2021-04-21 MED ORDER — ACETAMINOPHEN 160 MG/5ML PO SOLN
1000.0000 mg | Freq: Four times a day (QID) | ORAL | Status: DC
Start: 2021-04-22 — End: 2021-04-23

## 2021-04-21 MED ORDER — METOPROLOL TARTRATE 5 MG/5ML IV SOLN
INTRAVENOUS | Status: DC | PRN
  Administered 2021-04-21 (×2): 2.5 mg via INTRAVENOUS

## 2021-04-21 MED ORDER — DEXMEDETOMIDINE (PRECEDEX) IN NS 20 MCG/5ML (4 MCG/ML) IV SYRINGE
PREFILLED_SYRINGE | INTRAVENOUS | Status: DC | PRN
  Administered 2021-04-21: 12 ug via INTRAVENOUS
  Administered 2021-04-21: 8 ug via INTRAVENOUS
  Administered 2021-04-21: 12 ug via INTRAVENOUS

## 2021-04-21 MED ORDER — ACETAMINOPHEN 500 MG PO TABS: 1000.0000 mg | ORAL_TABLET | Freq: Once | ORAL | Status: DC | PRN

## 2021-04-21 MED ORDER — LIDOCAINE 2% (20 MG/ML) 5 ML SYRINGE
INTRAMUSCULAR | Status: DC | PRN
  Administered 2021-04-21: 60 mg via INTRAVENOUS

## 2021-04-21 MED ORDER — ACETAMINOPHEN 10 MG/ML IV SOLN: 1000.0000 mg | Freq: Once | INTRAVENOUS | Status: DC | PRN

## 2021-04-21 MED ORDER — SODIUM CHLORIDE FLUSH 0.9 % IV SOLN: INTRAVENOUS | Status: DC | PRN

## 2021-04-21 MED ORDER — PANTOPRAZOLE SODIUM 40 MG PO TBEC
80.0000 mg | DELAYED_RELEASE_TABLET | Freq: Every day | ORAL | Status: DC
Start: 2021-04-22 — End: 2021-04-23
  Administered 2021-04-22 – 2021-04-23 (×2): 80 mg via ORAL
  Filled 2021-04-21 (×2): qty 2

## 2021-04-21 MED ORDER — ROCURONIUM BROMIDE 10 MG/ML (PF) SYRINGE
PREFILLED_SYRINGE | INTRAVENOUS | Status: DC | PRN
  Administered 2021-04-21: 80 mg via INTRAVENOUS
  Administered 2021-04-21 (×2): 20 mg via INTRAVENOUS

## 2021-04-21 MED ORDER — FENTANYL CITRATE (PF) 250 MCG/5ML IJ SOLN
INTRAMUSCULAR | Status: AC
  Filled 2021-04-21: qty 5

## 2021-04-21 MED ORDER — FLUTICASONE-UMECLIDIN-VILANT 100-62.5-25 MCG/INH IN AEPB: 1.0000 | INHALATION_SPRAY | Freq: Every day | RESPIRATORY_TRACT | Status: DC

## 2021-04-21 MED ORDER — CEFAZOLIN SODIUM-DEXTROSE 2-4 GM/100ML-% IV SOLN
2.0000 g | Freq: Three times a day (TID) | INTRAVENOUS | Status: AC
  Administered 2021-04-21 – 2021-04-22 (×2): 2 g via INTRAVENOUS
  Filled 2021-04-21 (×3): qty 100

## 2021-04-21 MED ORDER — SODIUM CHLORIDE 0.45 % IV SOLN: INTRAVENOUS | Status: DC

## 2021-04-21 MED ORDER — MIDAZOLAM HCL 2 MG/2ML IJ SOLN
INTRAMUSCULAR | Status: DC | PRN
  Administered 2021-04-21: 2 mg via INTRAVENOUS

## 2021-04-21 MED ORDER — BUPIVACAINE LIPOSOME 1.3 % IJ SUSP
INTRAMUSCULAR | Status: AC
  Filled 2021-04-21: qty 20

## 2021-04-21 MED ORDER — ACETAMINOPHEN 160 MG/5ML PO SOLN: 1000.0000 mg | Freq: Once | ORAL | Status: DC | PRN

## 2021-04-21 MED ORDER — ORAL CARE MOUTH RINSE: 15.0000 mL | Freq: Once | OROMUCOSAL | Status: AC

## 2021-04-21 MED ORDER — SENNOSIDES-DOCUSATE SODIUM 8.6-50 MG PO TABS
1.0000 | ORAL_TABLET | Freq: Every day | ORAL | Status: DC
  Administered 2021-04-22: 1 via ORAL
  Filled 2021-04-21: qty 1

## 2021-04-21 MED ORDER — FLUTICASONE FUROATE-VILANTEROL 100-25 MCG/INH IN AEPB
1.0000 | INHALATION_SPRAY | Freq: Every day | RESPIRATORY_TRACT | Status: DC
  Administered 2021-04-22 – 2021-04-23 (×2): 1 via RESPIRATORY_TRACT
  Filled 2021-04-21: qty 28

## 2021-04-21 MED ORDER — ESMOLOL HCL 100 MG/10ML IV SOLN
INTRAVENOUS | Status: DC | PRN
  Administered 2021-04-21: 40 mg via INTRAVENOUS

## 2021-04-21 MED ORDER — OXYCODONE HCL 5 MG PO TABS
5.0000 mg | ORAL_TABLET | Freq: Once | ORAL | Status: DC | PRN
Start: 2021-04-21 — End: 2021-04-21

## 2021-04-21 MED ORDER — ONDANSETRON HCL 4 MG/2ML IJ SOLN: 4.0000 mg | Freq: Four times a day (QID) | INTRAMUSCULAR | Status: DC | PRN

## 2021-04-21 MED ORDER — UMECLIDINIUM BROMIDE 62.5 MCG/INH IN AEPB
1.0000 | INHALATION_SPRAY | Freq: Every day | RESPIRATORY_TRACT | Status: DC
  Administered 2021-04-22 – 2021-04-23 (×2): 1 via RESPIRATORY_TRACT
  Filled 2021-04-21: qty 7

## 2021-04-21 MED ORDER — METOCLOPRAMIDE HCL 5 MG/ML IJ SOLN
10.0000 mg | Freq: Four times a day (QID) | INTRAMUSCULAR | Status: AC
  Administered 2021-04-21 – 2021-04-22 (×4): 10 mg via INTRAVENOUS
  Filled 2021-04-21 (×4): qty 2

## 2021-04-21 MED ORDER — PROPOFOL 10 MG/ML IV BOLUS
INTRAVENOUS | Status: DC | PRN
  Administered 2021-04-21: 40 mg via INTRAVENOUS
  Administered 2021-04-21: 80 mg via INTRAVENOUS
  Administered 2021-04-21: 120 mg via INTRAVENOUS
  Administered 2021-04-21: 30 mg via INTRAVENOUS
  Administered 2021-04-21: 20 mg via INTRAVENOUS

## 2021-04-21 MED ORDER — BISACODYL 5 MG PO TBEC
10.0000 mg | DELAYED_RELEASE_TABLET | Freq: Every day | ORAL | Status: DC
  Administered 2021-04-22 – 2021-04-23 (×2): 10 mg via ORAL
  Filled 2021-04-21 (×2): qty 2

## 2021-04-21 MED ORDER — BUPIVACAINE HCL (PF) 0.5 % IJ SOLN
INTRAMUSCULAR | Status: AC
  Filled 2021-04-21: qty 30

## 2021-04-21 MED ORDER — OXYCODONE HCL 5 MG/5ML PO SOLN: 5.0000 mg | Freq: Once | ORAL | Status: DC | PRN

## 2021-04-21 MED ORDER — SUGAMMADEX SODIUM 200 MG/2ML IV SOLN
INTRAVENOUS | Status: DC | PRN
  Administered 2021-04-21: 200 mg via INTRAVENOUS

## 2021-04-21 MED ORDER — ACETAMINOPHEN 500 MG PO TABS
1000.0000 mg | ORAL_TABLET | Freq: Four times a day (QID) | ORAL | Status: DC
  Administered 2021-04-21 – 2021-04-23 (×7): 1000 mg via ORAL
  Filled 2021-04-21 (×7): qty 2

## 2021-04-21 MED ORDER — MORPHINE SULFATE (PF) 2 MG/ML IV SOLN
2.0000 mg | INTRAVENOUS | Status: DC | PRN
  Administered 2021-04-21 – 2021-04-23 (×5): 2 mg via INTRAVENOUS
  Filled 2021-04-21 (×4): qty 1

## 2021-04-21 SURGICAL SUPPLY — 109 items
APPLICATOR COTTON TIP 6 STRL (MISCELLANEOUS) ×4 IMPLANT
APPLICATOR COTTON TIP 6IN STRL (MISCELLANEOUS) ×6
APPLIER CLIP ROT 10 11.4 M/L (STAPLE)
BLADE CLIPPER SURG (BLADE) IMPLANT
CANISTER SUCT 3000ML PPV (MISCELLANEOUS) ×3 IMPLANT
CATH THORACIC 28FR (CATHETERS) IMPLANT
CATH THORACIC 28FR RT ANG (CATHETERS) IMPLANT
CATH THORACIC 36FR (CATHETERS) IMPLANT
CATH THORACIC 36FR RT ANG (CATHETERS) IMPLANT
CLIP APPLIE ROT 10 11.4 M/L (STAPLE) IMPLANT
CLIP TI MEDIUM 24 (CLIP) ×3 IMPLANT
CNTNR URN SCR LID CUP LEK RST (MISCELLANEOUS) ×26 IMPLANT
CONN ST 1/4X3/8  BEN (MISCELLANEOUS) ×3
CONN ST 1/4X3/8 BEN (MISCELLANEOUS) ×2 IMPLANT
CONN Y 3/8X3/8X3/8  BEN (MISCELLANEOUS)
CONN Y 3/8X3/8X3/8 BEN (MISCELLANEOUS) IMPLANT
CONT SPEC 4OZ STRL OR WHT (MISCELLANEOUS) ×39
COVER SURGICAL LIGHT HANDLE (MISCELLANEOUS) IMPLANT
DEFOGGER ANTIFOG KIT (MISCELLANEOUS) ×3 IMPLANT
DEFOGGER SCOPE WARMER CLEARIFY (MISCELLANEOUS) IMPLANT
DERMABOND ADVANCED (GAUZE/BANDAGES/DRESSINGS) ×1
DERMABOND ADVANCED .7 DNX12 (GAUZE/BANDAGES/DRESSINGS) ×2 IMPLANT
DRAIN CHANNEL 28F RND 3/8 FF (WOUND CARE) ×3 IMPLANT
DRAIN CHANNEL 32F RND 10.7 FF (WOUND CARE) IMPLANT
DRAPE CV SPLIT W-CLR ANES SCRN (DRAPES) ×3 IMPLANT
DRAPE HALF SHEET 40X57 (DRAPES) ×3 IMPLANT
DRAPE INCISE IOBAN 66X45 STRL (DRAPES) IMPLANT
DRAPE ORTHO SPLIT 77X108 STRL (DRAPES) ×3
DRAPE SURG ORHT 6 SPLT 77X108 (DRAPES) ×2 IMPLANT
ELECT BLADE 6.5 EXT (BLADE) ×3 IMPLANT
ELECT REM PT RETURN 9FT ADLT (ELECTROSURGICAL) ×3
ELECTRODE REM PT RTRN 9FT ADLT (ELECTROSURGICAL) ×2 IMPLANT
GAUZE KITTNER 4X5 RF (MISCELLANEOUS) ×6 IMPLANT
GAUZE SPONGE 4X4 12PLY STRL (GAUZE/BANDAGES/DRESSINGS) ×3 IMPLANT
GLOVE SURG POLYISO LF SZ8 (GLOVE) ×6 IMPLANT
GLOVE SURG SIGNA 7.5 PF LTX (GLOVE) ×6 IMPLANT
GLOVE SURG SYN 7.5  E (GLOVE)
GLOVE SURG SYN 7.5 E (GLOVE) IMPLANT
GLOVE TRIUMPH SURG SIZE 7.5 (KITS) IMPLANT
GOWN STRL REUS W/ TWL LRG LVL3 (GOWN DISPOSABLE) ×8 IMPLANT
GOWN STRL REUS W/ TWL XL LVL3 (GOWN DISPOSABLE) ×6 IMPLANT
GOWN STRL REUS W/TWL 2XL LVL3 (GOWN DISPOSABLE) ×3 IMPLANT
GOWN STRL REUS W/TWL LRG LVL3 (GOWN DISPOSABLE) ×12
GOWN STRL REUS W/TWL XL LVL3 (GOWN DISPOSABLE) ×9
HEMOSTAT SURGICEL 2X14 (HEMOSTASIS) IMPLANT
KIT BASIN OR (CUSTOM PROCEDURE TRAY) ×3 IMPLANT
KIT SUCTION CATH 14FR (SUCTIONS) IMPLANT
KIT TURNOVER KIT B (KITS) ×3 IMPLANT
NEEDLE 22X1 1/2 (OR ONLY) (NEEDLE) ×3 IMPLANT
NEEDLE HYPO 25GX1X1/2 BEV (NEEDLE) ×3 IMPLANT
NEEDLE SPNL 22GX3.5 QUINCKE BK (NEEDLE) ×3 IMPLANT
NEEDLE SPNL 22GX7 QUINCKE BK (NEEDLE) IMPLANT
NS IRRIG 1000ML POUR BTL (IV SOLUTION) ×3 IMPLANT
PACK CHEST (CUSTOM PROCEDURE TRAY) ×3 IMPLANT
PAD ARMBOARD 7.5X6 YLW CONV (MISCELLANEOUS) ×12 IMPLANT
POUCH ENDO CATCH II 15MM (MISCELLANEOUS) IMPLANT
POUCH SPECIMEN RETRIEVAL 10MM (ENDOMECHANICALS) IMPLANT
RELOAD STAPLER WHITE 60MM (STAPLE) ×2 IMPLANT
SCISSORS LAP 5X35 DISP (ENDOMECHANICALS) IMPLANT
SEALANT PROGEL (MISCELLANEOUS) IMPLANT
SEALANT SURG COSEAL 4ML (VASCULAR PRODUCTS) IMPLANT
SEALANT SURG COSEAL 8ML (VASCULAR PRODUCTS) IMPLANT
SHEARS HARMONIC HDI 20CM (ELECTROSURGICAL) IMPLANT
SOL ANTI FOG 6CC (MISCELLANEOUS) ×2 IMPLANT
SOLUTION ANTI FOG 6CC (MISCELLANEOUS) ×1
SPONGE INTESTINAL PEANUT (DISPOSABLE) ×6 IMPLANT
SPONGE TONSIL TAPE 1 RFD (DISPOSABLE) IMPLANT
STAPLE RELOAD 2.5MM WHITE (STAPLE) ×21 IMPLANT
STAPLE RELOAD 45 GRN (STAPLE) ×4 IMPLANT
STAPLE RELOAD 45MM BLUE (STAPLE) ×15 IMPLANT
STAPLE RELOAD 45MM GREEN (STAPLE) ×6
STAPLER ECHELON POWERED (MISCELLANEOUS) ×3 IMPLANT
STAPLER RELOAD WHITE 60MM (STAPLE) ×3
STAPLER VASCULAR ECHELON 35 (CUTTER) ×3 IMPLANT
STOPCOCK 4 WAY LG BORE MALE ST (IV SETS) ×3 IMPLANT
SUT PDS AB 3-0 SH 27 (SUTURE) IMPLANT
SUT PROLENE 4 0 RB 1 (SUTURE) ×3
SUT PROLENE 4-0 RB1 .5 CRCL 36 (SUTURE) ×2 IMPLANT
SUT SILK  1 MH (SUTURE) ×6
SUT SILK 1 MH (SUTURE) ×4 IMPLANT
SUT SILK 1 TIES 10X30 (SUTURE) ×3 IMPLANT
SUT SILK 2 0 SH (SUTURE) ×6 IMPLANT
SUT SILK 2 0SH CR/8 30 (SUTURE) IMPLANT
SUT SILK 3 0 SH 30 (SUTURE) IMPLANT
SUT SILK 3 0SH CR/8 30 (SUTURE) ×3 IMPLANT
SUT VIC AB 1 CTX 36 (SUTURE) ×3
SUT VIC AB 1 CTX36XBRD ANBCTR (SUTURE) ×2 IMPLANT
SUT VIC AB 2-0 CT1 27 (SUTURE) ×3
SUT VIC AB 2-0 CT1 TAPERPNT 27 (SUTURE) ×2 IMPLANT
SUT VIC AB 2-0 CTX 36 (SUTURE) ×6 IMPLANT
SUT VIC AB 3-0 MH 27 (SUTURE) IMPLANT
SUT VIC AB 3-0 X1 27 (SUTURE) ×6 IMPLANT
SUT VICRYL 0 UR6 27IN ABS (SUTURE) ×6 IMPLANT
SUT VICRYL 2 TP 1 (SUTURE) IMPLANT
SYR 10ML LL (SYRINGE) ×3 IMPLANT
SYR 20CC LL (SYRINGE) ×6 IMPLANT
SYR 20ML LL LF (SYRINGE) ×6 IMPLANT
SYR 30ML SLIP (SYRINGE) ×3 IMPLANT
SYSTEM RETRIEVAL ANCHOR 15 (MISCELLANEOUS) ×3 IMPLANT
SYSTEM SAHARA CHEST DRAIN ATS (WOUND CARE) ×6 IMPLANT
TAPE CLOTH 4X10 WHT NS (GAUZE/BANDAGES/DRESSINGS) ×3 IMPLANT
TAPE CLOTH SURG 4X10 WHT LF (GAUZE/BANDAGES/DRESSINGS) ×3 IMPLANT
TIP APPLICATOR SPRAY EXTEND 16 (VASCULAR PRODUCTS) IMPLANT
TOWEL GREEN STERILE (TOWEL DISPOSABLE) ×3 IMPLANT
TOWEL GREEN STERILE FF (TOWEL DISPOSABLE) ×3 IMPLANT
TRAY FOLEY MTR SLVR 16FR STAT (SET/KITS/TRAYS/PACK) ×6 IMPLANT
TROCAR XCEL BLADELESS 5X75MML (TROCAR) ×3 IMPLANT
TUBING EXTENTION W/L.L. (IV SETS) ×3 IMPLANT
WATER STERILE IRR 1000ML POUR (IV SOLUTION) ×3 IMPLANT

## 2021-04-21 NOTE — Brief Op Note (Addendum)
04/21/2021  7:28 PM  PATIENT:  Bryan Fleming  60 y.o. male  PRE-OPERATIVE DIAGNOSIS:  LEFT UPPER LOBE SQUAMOUS CELL CARCINOMA  POST-OPERATIVE DIAGNOSIS:  LEFT UPPER LOBE SQUAMOUS CELL CARCINOMA  PROCEDURE:  Procedure(s):  VIDEO ASSISTED THORACOSCOPY (VATS) -Left Upper Lobectomy -Lymph Node Dissection  INTERCOSTAL NERVE BLOCK  SURGEON:  Surgeon(s) and Role:    * Melrose Nakayama, MD - Primary  PHYSICIAN ASSISTANT: Enid Cutter PA-C, Erin Barrett PA-C  ASSISTANTS: none   ANESTHESIA:   general  EBL:  100 mL   BLOOD ADMINISTERED:none  DRAINS:  28 [address]    LOCAL MEDICATIONS USED:  BUPIVICAINE   SPECIMEN:  Source of Specimen:  Left upper lobe, lymph nodes  DISPOSITION OF SPECIMEN:  PATHOLOGY  COUNTS:  YES  TOURNIQUET:  * No tourniquets in log *  DICTATION: .Dragon Dictation  PLAN OF CARE: Admit to inpatient   PATIENT DISPOSITION:  PACU - hemodynamically stable.   Delay start of Pharmacological VTE agent (>24hrs) due to surgical blood loss or risk of bleeding: no

## 2021-04-21 NOTE — Interval H&P Note (Signed)
History and Physical Interval Note:   The robot is currently still in use from 1st case that has gone on significantly longer than it was scheduled for and is still a couple of hours away from completion.  I discussed with Bryan Fleming that we will not be able to do robotic procedure today, as by the time it is available we will be after hours with limited staff. I gave him to option of coming back tomorrow (or another day) for the procedure. He is adamant that the surgery be done today.  I gave him the option of proceeding today with a VATS rather than the robot and he prefers that option to being rescheduled.  He understands the incision will be bigger.  May have more pain but that is highly variable and can't be predicted one way or the other.  He wishes to proceed with left VATS for upper lobectomy   04/21/2021 2:34 PM  Bryan Fleming  has presented today for surgery, with the diagnosis of LUL SQUAMOUS CELL CARCINOMA.  The various methods of treatment have been discussed with the patient and family. After consideration of risks, benefits and other options for treatment, the patient has consented to  Procedure(s): XI ROBOTIC ASSISTED THORASCOPY-LEFT UPPER LOBECTOMY (Left) as a surgical intervention.  The patient's history has been reviewed, patient examined, no change in status, stable for surgery.  I have reviewed the patient's chart and labs.  Questions were answered to the patient's satisfaction.     Melrose Nakayama

## 2021-04-21 NOTE — Anesthesia Procedure Notes (Signed)
Procedure Name: Intubation Date/Time: 04/21/2021 3:23 PM Performed by: Janace Litten, CRNA Pre-anesthesia Checklist: Patient identified, Emergency Drugs available, Suction available and Patient being monitored Patient Re-evaluated:Patient Re-evaluated prior to induction Oxygen Delivery Method: Circle System Utilized Preoxygenation: Pre-oxygenation with 100% oxygen Induction Type: IV induction Ventilation: Mask ventilation without difficulty Laryngoscope Size: Mac and 4 Grade View: Grade I Tube type: Oral Endobronchial tube: Left, Double lumen EBT, EBT position confirmed by auscultation and EBT position confirmed by fiberoptic bronchoscope and 37 Fr Number of attempts: 1 Airway Equipment and Method: Stylet and Oral airway Placement Confirmation: ETT inserted through vocal cords under direct vision, positive ETCO2 and breath sounds checked- equal and bilateral Tube secured with: Tape Dental Injury: Teeth and Oropharynx as per pre-operative assessment

## 2021-04-21 NOTE — Anesthesia Procedure Notes (Signed)
Arterial Line Insertion Start/End9/14/2022 10:45 AM, 04/21/2021 10:49 AM Performed by: Oleta Mouse, MD, Josephine Igo, CRNA, CRNA  Preanesthetic checklist: patient identified, IV checked, site marked, risks and benefits discussed, surgical consent, monitors and equipment checked, pre-op evaluation, timeout performed and anesthesia consent Lidocaine 1% used for infiltration Right, radial was placed Catheter size: 20 G Hand hygiene performed , maximum sterile barriers used  and Seldinger technique used  Attempts: 1 Procedure performed without using ultrasound guided technique. Following insertion, dressing applied and Biopatch. Post procedure assessment: normal and unchanged  Patient tolerated the procedure well with no immediate complications. Additional procedure comments: Placed by Tonna Corner.

## 2021-04-21 NOTE — Transfer of Care (Signed)
Immediate Anesthesia Transfer of Care Note  Patient: Bryan Fleming  Procedure(s) Performed: VIDEO ASSISTED THORACOSCOPY (VATS)/ LOBECTOMY (Left: Chest) INTERCOSTAL NERVE BLOCK  Patient Location: PACU  Anesthesia Type:General  Level of Consciousness: awake, alert  and oriented  Airway & Oxygen Therapy: Patient Spontanous Breathing and Patient connected to face mask oxygen  Post-op Assessment: Report given to RN, Post -op Vital signs reviewed and stable and Patient moving all extremities  Post vital signs: Reviewed and stable  Last Vitals:  Vitals Value Taken Time  BP 140/77 04/21/21 1951  Temp    Pulse 91 04/21/21 1959  Resp 22 04/21/21 1959  SpO2 99 % 04/21/21 1959  Vitals shown include unvalidated device data.  Last Pain:  Vitals:   04/21/21 1025  TempSrc:   PainSc: 0-No pain      Patients Stated Pain Goal: 3 (63/33/54 [id number])  Complications: No notable events documented.

## 2021-04-21 NOTE — Anesthesia Postprocedure Evaluation (Signed)
Anesthesia Post Note  Patient: Bryan Fleming  Procedure(s) Performed: VIDEO ASSISTED THORACOSCOPY (VATS)/ LOBECTOMY (Left: Chest) INTERCOSTAL NERVE BLOCK     Patient location during evaluation: PACU Anesthesia Type: General Level of consciousness: awake Pain management: pain level controlled Vital Signs Assessment: post-procedure vital signs reviewed and stable Respiratory status: spontaneous breathing, nonlabored ventilation, respiratory function stable and patient connected to nasal cannula oxygen Cardiovascular status: blood pressure returned to baseline and stable Postop Assessment: no apparent nausea or vomiting Anesthetic complications: no   No notable events documented.  Last Vitals:  Vitals:   04/21/21 2055 04/21/21 2113  BP: 112/72 111/85  Pulse: 90 94  Resp: 14 18  Temp: 37 C 36.9 C  SpO2: 97% 97%    Last Pain:  Vitals:   04/21/21 2113  TempSrc: Oral  PainSc: 8                  Estera Ozier P Augustine Brannick

## 2021-04-21 NOTE — Progress Notes (Signed)
Heart rate 106 and BP 166/86. Called and made Dr. Ermalene Postin with anesthesia aware. Also made Dr. Ermalene Postin aware that per the patient he has not taken Norvasc in 2 weeks. Patient states he is not out of the medication, he has just chosen not to take it.

## 2021-04-22 ENCOUNTER — Inpatient Hospital Stay (HOSPITAL_COMMUNITY): Payer: Medicare PPO

## 2021-04-22 ENCOUNTER — Encounter (HOSPITAL_COMMUNITY): Payer: Self-pay | Admitting: Thoracic Surgery (Cardiothoracic Vascular Surgery)

## 2021-04-22 LAB — BASIC METABOLIC PANEL
Anion gap: 10 (ref 5–15)
BUN: 6 mg/dL (ref 6–20)
CO2: 23 mmol/L (ref 22–32)
Calcium: 9.1 mg/dL (ref 8.9–10.3)
Chloride: 100 mmol/L (ref 98–111)
Creatinine, Ser: 0.74 mg/dL (ref 0.61–1.24)
GFR, Estimated: >60 mL/min (ref >60)
Glucose, Bld: 207 mg/dL — ABNORMAL HIGH (ref 70–99)
Potassium: 4 mmol/L (ref 3.5–5.1)
Sodium: 133 mmol/L — ABNORMAL LOW (ref 135–145)

## 2021-04-22 LAB — CBC
HCT: 39.2 % (ref 39.0–52.0)
Hemoglobin: 12.9 g/dL — ABNORMAL LOW (ref 13.0–17.0)
MCH: 31.2 pg (ref 26.0–34.0)
MCHC: 32.9 g/dL (ref 30.0–36.0)
MCV: 94.9 fL (ref 80.0–100.0)
Platelets: 291 10*3/uL (ref 150–400)
RBC: 4.13 MIL/uL — ABNORMAL LOW (ref 4.22–5.81)
RDW: 19.3 % — ABNORMAL HIGH (ref 11.5–15.5)
WBC: 14.1 10*3/uL — ABNORMAL HIGH (ref 4.0–10.5)
nRBC: 0 % (ref 0.0–0.2)

## 2021-04-22 IMAGING — DX DG CHEST 1V PORT
1 series · 1 of 1 positions shown · non-contrast
Comparison: [DATE]

CLINICAL DATA: Chest tube, status post lobectomy, sore chest

EXAM:
PORTABLE CHEST 1 VIEW

[chest]
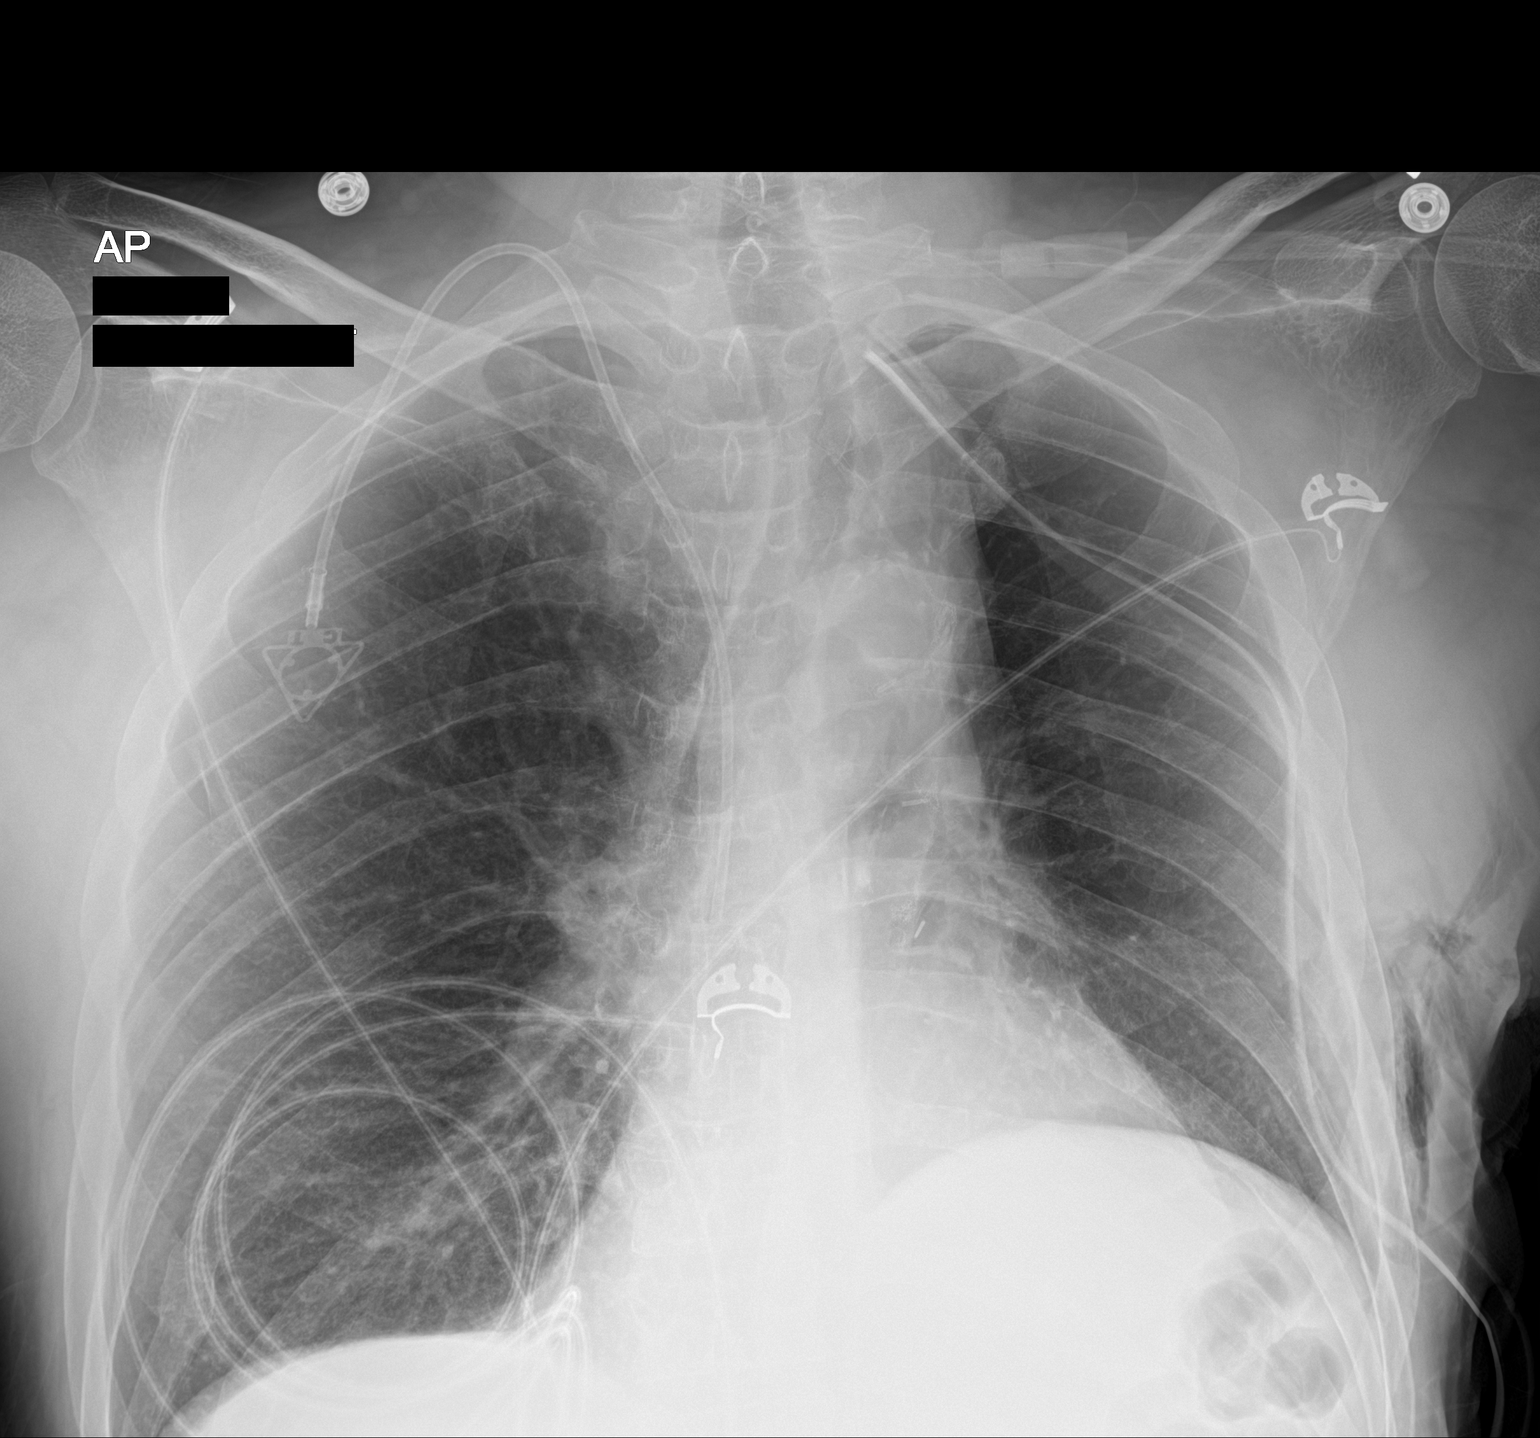

[1 of 1 positions shown; findings below may reference images not displayed]

FINDINGS: Left chest tube remains in place. No pneumothorax. Subcutaneous air
in the left chest wall slightly increased. Right Port-A-Cath is
unchanged. Postoperative changes on the left. No confluent opacities
or effusions. Heart is normal size.
IMPRESSION: Postoperative changes on the left left chest tube. No pneumothorax.
Slight increased subcutaneous emphysema in the left chest wall.

## 2021-04-22 MED ORDER — NICOTINE 14 MG/24HR TD PT24
14.0000 mg | MEDICATED_PATCH | Freq: Every day | TRANSDERMAL | Status: DC
  Administered 2021-04-22 – 2021-04-23 (×2): 14 mg via TRANSDERMAL
  Filled 2021-04-22 (×2): qty 1

## 2021-04-22 MED ORDER — PROCHLORPERAZINE EDISYLATE 10 MG/2ML IJ SOLN
10.0000 mg | Freq: Four times a day (QID) | INTRAMUSCULAR | Status: DC | PRN
  Filled 2021-04-22: qty 2

## 2021-04-22 NOTE — Progress Notes (Addendum)
      [address]       [place] [address]             [phone]      1 Day Post-Op Procedure(s) (LRB): VIDEO ASSISTED THORACOSCOPY (VATS)/ LOBECTOMY (Left) INTERCOSTAL NERVE BLOCK  Subjective:  The patient has some pain.  He isn't getting full relief with medication.  He has nausea at times  Objective: Vital signs in last 24 hours: Temp:  [97.7 F (36.5 C)-98.7 F (37.1 C)] 98.7 F (37.1 C) (09/15 0719) Pulse Rate:  [83-106] 83 (09/15 0719) Cardiac Rhythm: Normal sinus rhythm (09/14 2122) Resp:  [14-20] 16 (09/15 0719) BP: (100-177)/(68-86) 146/80 (09/15 0719) SpO2:  [92 %-100 %] 100 % (09/15 0719) Arterial Line BP: (151-167)/(60-61) 155/60 (09/14 2040) Weight:  [71.7 kg] 71.7 kg (09/14 1012)  Intake/Output from previous day: 09/14 0701 - 09/15 0700 In: 2480 [P.O.:480; I.V.:2000] Out: 1170 [Urine:1000; Blood:100; Chest Tube:70]  General appearance: alert, cooperative, and no distress Heart: regular rate and rhythm Lungs: clear to auscultation bilaterally Abdomen: soft, non-tender; bowel sounds normal; no masses,  no organomegaly Extremities: extremities normal, atraumatic, no cyanosis or edema Wound: clean and dry  Lab Results: Recent Labs    04/19/21 1515 04/22/21 0313  WBC 8.1 14.1*  HGB 13.6 12.9*  HCT 40.4 39.2  PLT 306 291   BMET:  Recent Labs    04/19/21 1515 04/22/21 0313  NA 138 133*  K 3.5 4.0  CL 102 100  CO2 23 23  GLUCOSE 120* 207*  BUN <5* 6  CREATININE 0.56* 0.74  CALCIUM 9.5 9.1    PT/INR:  Recent Labs    04/19/21 1515  LABPROT 12.6  INR 1.0   ABG    Component Value Date/Time   PHART 7.449 04/19/2021 1454   HCO3 25.6 04/19/2021 1454   O2SAT 96.3 04/19/2021 1454   CBG (last 3)  No results for input(s): GLUCAP in the last 72 hours.  Assessment/Plan: S/P Procedure(s) (LRB): VIDEO ASSISTED THORACOSCOPY (VATS)/ LOBECTOMY (Left) INTERCOSTAL NERVE BLOCK  CV- NSR, H/O HTN- home Norvasc ordered Pulm-  not on oxygen, CT with low output about 80 cc since surgery, CXR shows trace pneumothorax on the left, leave chest tube to water seal today, repeat CXR in AM D/C Arterial line- order was placed to remove in PACU last night, nurse reports when they brought patient over she was told order was placed to remove in PACU, but they didn't do this, so she left to draw his morning labs Lovenox for DVT prophylaxis GI- eating some, will add compazine prn for nausea which he stated worked well for him during Chemo, stop IV fluids Dispo- patient stable, HTN home norvasc ordered, d/c arterial lines, foley, IV fluids, repeat CXR in AM   LOS: 1 day   Ellwood Handler, PA-C 04/22/2021 Patient seen and examined, agree with above Will leave tube in today Dc Foley Advance diet Ambulate SCD + enoxaparin  Remo Lipps C. Roxan Hockey, MD [facility] [phone]

## 2021-04-22 NOTE — Op Note (Signed)
NAMEMIQUEAS, WHILDEN MEDICAL RECORD NO: [id number] ACCOUNT NO: [id number] DATE OF BIRTH: May 17, 1961 FACILITY: [facility] LOCATION: [facility] PHYSICIAN: Revonda Standard. Roxan Hockey, MD  Operative Report   DATE OF PROCEDURE: 04/21/2021   PREOPERATIVE DIAGNOSIS:  Non-small cell carcinoma, left upper lobe.  POSTOPERATIVE DIAGNOSIS:  Non-small cell carcinoma, left upper lobe.  PROCEDURE:   Left video-assisted thoracoscopy,  Left upper lobectomy,  Lymph node sampling,  Intercostal nerve blocks at levels 3 through 10.  SURGEON:  Revonda Standard. Roxan Hockey, MD  ASSISTANT: Ellwood Handler, PA  ANESTHESIA:  General.  FINDINGS:  Inflammatory response around the nodes and vessels, bronchial margin negative for tumor.  CLINICAL NOTE:  Bryan Fleming is a 60 year old gentleman diagnosed earlier this year with squamous cell carcinoma of the left upper lobe.  He had undergone preoperative chemotherapy with carboplatin and paclitaxel for clinical stage IIIA disease with  involvement of an aortopulmonary window lymph node.  Followup CT showed a good response.  He was offered surgical resection.  The indications, risks, benefits, and alternatives were discussed in detail with the patient.  He understood and accepted the  risks and agreed to proceed.  PROCEDURE: Bryan Fleming was brought to the preoperative holding area on 04/21/2021.  Anesthesia placed a central venous catheter and arterial blood pressure monitoring line.  He was taken to the operating room and anesthetized and intubated.  A Foley  catheter was placed.  Intravenous antibiotics were administered.  Sequential compression devices were placed on the calves for DVT prophylaxis.  He was placed in a right lateral decubitus position and the left chest was prepped and draped in the usual  sterile fashion.  Single lung ventilation of the right lung was initiated and was tolerated well throughout the procedure.  A timeout was performed.  A solution  containing 20 mL of liposomal bupivacaine, 30 mL of 0.5% bupivacaine and 50 mL of saline was prepared.  This was used for the intercostal nerve blocks and direct injection for local at the incisions.  An incision was made in  the eighth interspace in the mid axillary line, a 5 mm port was inserted.  The thoracoscope was advanced in the chest.  There was no cross ventilation of the left lung, but it was relatively slow to deflate.  Suction was applied through the endobronchial  tube and the lung slowly deflated.  Operation proceeded.  An incision was made in the fourth interspace anterolaterally.  This was carried through the skin and subcutaneous tissue.  The serratus muscles were separated and the intercostal muscles were  divided.  This incision was approximately 5 cm in length.  A second port incision was made anterior to the first incision for passage of the stapler for certain firings.  Initial inspection revealed no pleural effusion.  There were some thin filmy adhesions to the chest wall, but no invasion.  The inferior ligament was divided, a small node was present there, but while dissecting the node out it disintegrated and there was  no nodal tissue to send for pathology.  The pleural reflection was divided at the hilum anteriorly.  Working superiorly, there was an enlarged and suspicious appearing aortopulmonary window node.  This node was dissected out and sent as a separate  specimen as were all nodes.  They were all sent for permanent pathology.  The pleural reflection was divided at the hilum posteriorly as well.  It should be noted that there was a relatively intense inflammatory reaction around all the nodes and around  the  vessels as well.  The fissure was relatively complete in its mid portion, there were portions anteriorly and posteriorly that were not complete.  The pulmonary artery was dissected out and identified and the fissure was completed with sequential  firings of an Echelon  stapler.  An Echelon 45 mm stapler was used for the parenchyma and bronchus and then a vascular stapler was used for the pulmonary artery and vein branches.  Dissection was relatively slow and tedious because of the inflammatory  reaction.  There was a large lingular branch that was encircled and divided with a vascular stapler.  There was a very small branch adjacent to this, which was ultimately divided with a stapler as well.  The dissection then was performed on the fissure  and it was completed with sequential firings of the stapler using both blue and green cartridges.  There was a large posterior segmental branch that was dissected out and encircled and divided.  Attention then was turned anteriorly.  The pulmonary vein  was dissected out and it was divided with the vascular stapler.  There was a large level 10 node, which was removed.  Anterior and apical branches were dissected out.  This was a slow and tedious dissection in this area and dissection was carried out both  from the fissure side as well as anteriorly.  Ultimately, these branches were encircled and divided with the vascular stapler.  The upper lobe was retracted and a stapler was placed across the left upper lobe bronchus.  A test inflation showed good  aeration of the lower lobe.  The stapler was fired transecting the left upper lobe bronchus.  The upper lobe was placed into an endoscopic retrieval bag, removed, and sent for frozen section of the bronchial margin was subsequently returned with no tumor  seen.  Chest was copiously irrigated with warm saline.  A test inflation showed no air leakage.  Intercostal nerve blocks were performed from the 3rd to the 10th interspace.  A needle was used to inject the bupivacaine solution into a subpleural plane.   The remainder of the solution was injected into the incision sites.  A 28-French Blake drain was placed through the anterior port incision and secured with a #1 silk suture.  Dual lung  ventilation was resumed.  The remaining incisions were closed in  standard fashion.  Chest tube was placed to a Pleur-Evac on waterseal.  The patient then was extubated in the operating room and taken to the postanesthetic care unit in good condition.  All sponge, needle and instrument counts were correct.     SUJ D: 04/21/2021 7:49:04 pm T: 04/22/2021 3:19:00 am  JOB: [id number]/ [id number]

## 2021-04-22 NOTE — Plan of Care (Signed)
  Problem: Clinical Measurements: Goal: Ability to maintain clinical measurements within normal limits will improve Outcome: Not Progressing   Problem: Nutrition: Goal: Adequate nutrition will be maintained Outcome: Not Progressing   Problem: Pain Managment: Goal: General experience of comfort will improve Outcome: Not Progressing

## 2021-04-22 NOTE — Plan of Care (Signed)
  Problem: Education: Goal: Knowledge of General Education information will improve Description: Including pain rating scale, medication(s)/side effects and non-pharmacologic comfort measures Outcome: Progressing   Problem: Health Behavior/Discharge Planning: Goal: Ability to manage health-related needs will improve Outcome: Progressing   Problem: Clinical Measurements: Goal: Ability to maintain clinical measurements within normal limits will improve Outcome: Progressing Goal: Will remain free from infection Outcome: Progressing   Problem: Nutrition: Goal: Adequate nutrition will be maintained Outcome: Progressing   Problem: Coping: Goal: Level of anxiety will decrease Outcome: Progressing   Problem: Pain Managment: Goal: General experience of comfort will improve Outcome: Progressing   Problem: Safety: Goal: Ability to remain free from injury will improve Outcome: Progressing   Problem: Skin Integrity: Goal: Risk for impaired skin integrity will decrease Outcome: Progressing

## 2021-04-22 NOTE — Hospital Course (Addendum)
History of Present Illness:  Bryan Fleming is a 60 year old man with a history of tobacco abuse, COPD, ethanol abuse, alcoholic hepatitis, pancreatitis, hypertension, hyperlipidemia, reflux, and depression.  He has about a 50-pack-year history of smoking (1.5 packs/day for x37 years).  He recently had a low-dose screening CT which showed a left upper lobe mass.  He was referred to Dr. Halford Chessman.  He did a PET/CT which showed the mass was hypermetabolic.  There also was a suspicious AP window lymph node.  There was some questionable activity in the chest wall.  He underwent navigational bronchoscopy by Dr. Lamonte Sakai.  Biopsy showed squamous cell carcinoma.  The lymph node was not accessible for sampling.  Brain MRI was negative for metastases.  He was evaluated by Dr. Delton Coombes and due to the size of the mass it was felt chemotherapy would be indicated first.  He completed this and was closely followed by Dr. Roxan Hockey, with most recent appointment being on 04/08/2021.  At which time it was felt patient could undergo surgical resection of his left upper lobe.  The risks and benefits of the procedure were explained to the patient and he was agreeable to proceed.   Hospital Course:  Bryan Fleming presented to [facility] on 04/21/2021.  He was taken to the operating room and underwent Left Video Assisted Thoracoscopy with Left Upper Lobectomy, Lymph Node Dissection, and Intercostal Nerve Block.  The patient tolerated the procedure without difficulty, was extubated, and taken to the PACU in stable condition.  The patient's arterial line and foley catheter were removed without difficulty.  His chest tube was free from air leak with minimal output.  CXR showed a trace left lateral pneumothorax.   He was hypertensive and treated with his home Norvasc.  The patient's chest tube output remained low.  CXR was stable without significant pneumothorax.  His chest tube was removed on 04/23/2021.  Follow up CXR showed  ***.  The patient remains clinically stable.  His blood pressure is well controlled.  His surgical incisions are healing without evidence of infection.  He does have some swelling along his left pec due to sub cutaneous emphysema.  This will resolve with time. He is felt medically stable for discharge home today.

## 2021-04-23 ENCOUNTER — Inpatient Hospital Stay (HOSPITAL_COMMUNITY): Payer: Medicare PPO

## 2021-04-23 LAB — COMPREHENSIVE METABOLIC PANEL
ALT: 20 U/L (ref 0–44)
AST: 36 U/L (ref 15–41)
Albumin: 2.9 g/dL — ABNORMAL LOW (ref 3.5–5.0)
Alkaline Phosphatase: 65 U/L (ref 38–126)
Anion gap: 6 (ref 5–15)
BUN: 6 mg/dL (ref 6–20)
CO2: 30 mmol/L (ref 22–32)
Calcium: 8.9 mg/dL (ref 8.9–10.3)
Chloride: 99 mmol/L (ref 98–111)
Creatinine, Ser: 0.63 mg/dL (ref 0.61–1.24)
GFR, Estimated: >60 mL/min (ref >60)
Glucose, Bld: 127 mg/dL — ABNORMAL HIGH (ref 70–99)
Potassium: 3.6 mmol/L (ref 3.5–5.1)
Sodium: 135 mmol/L (ref 135–145)
Total Bilirubin: 0.5 mg/dL (ref 0.3–1.2)
Total Protein: 6.5 g/dL (ref 6.5–8.1)

## 2021-04-23 LAB — CBC
HCT: 35.8 % — ABNORMAL LOW (ref 39.0–52.0)
Hemoglobin: 11.8 g/dL — ABNORMAL LOW (ref 13.0–17.0)
MCH: 31.6 pg (ref 26.0–34.0)
MCHC: 33 g/dL (ref 30.0–36.0)
MCV: 95.7 fL (ref 80.0–100.0)
Platelets: 267 10*3/uL (ref 150–400)
RBC: 3.74 MIL/uL — ABNORMAL LOW (ref 4.22–5.81)
RDW: 19 % — ABNORMAL HIGH (ref 11.5–15.5)
WBC: 13 10*3/uL — ABNORMAL HIGH (ref 4.0–10.5)
nRBC: 0 % (ref 0.0–0.2)

## 2021-04-23 IMAGING — DX DG CHEST 1V SAME DAY
1 series · 1 of 1 positions shown · non-contrast
Comparison: [DATE] [DATE] a.m.

CLINICAL DATA: Chest tube removal

EXAM:
CHEST - 1 VIEW SAME DAY

[chest]
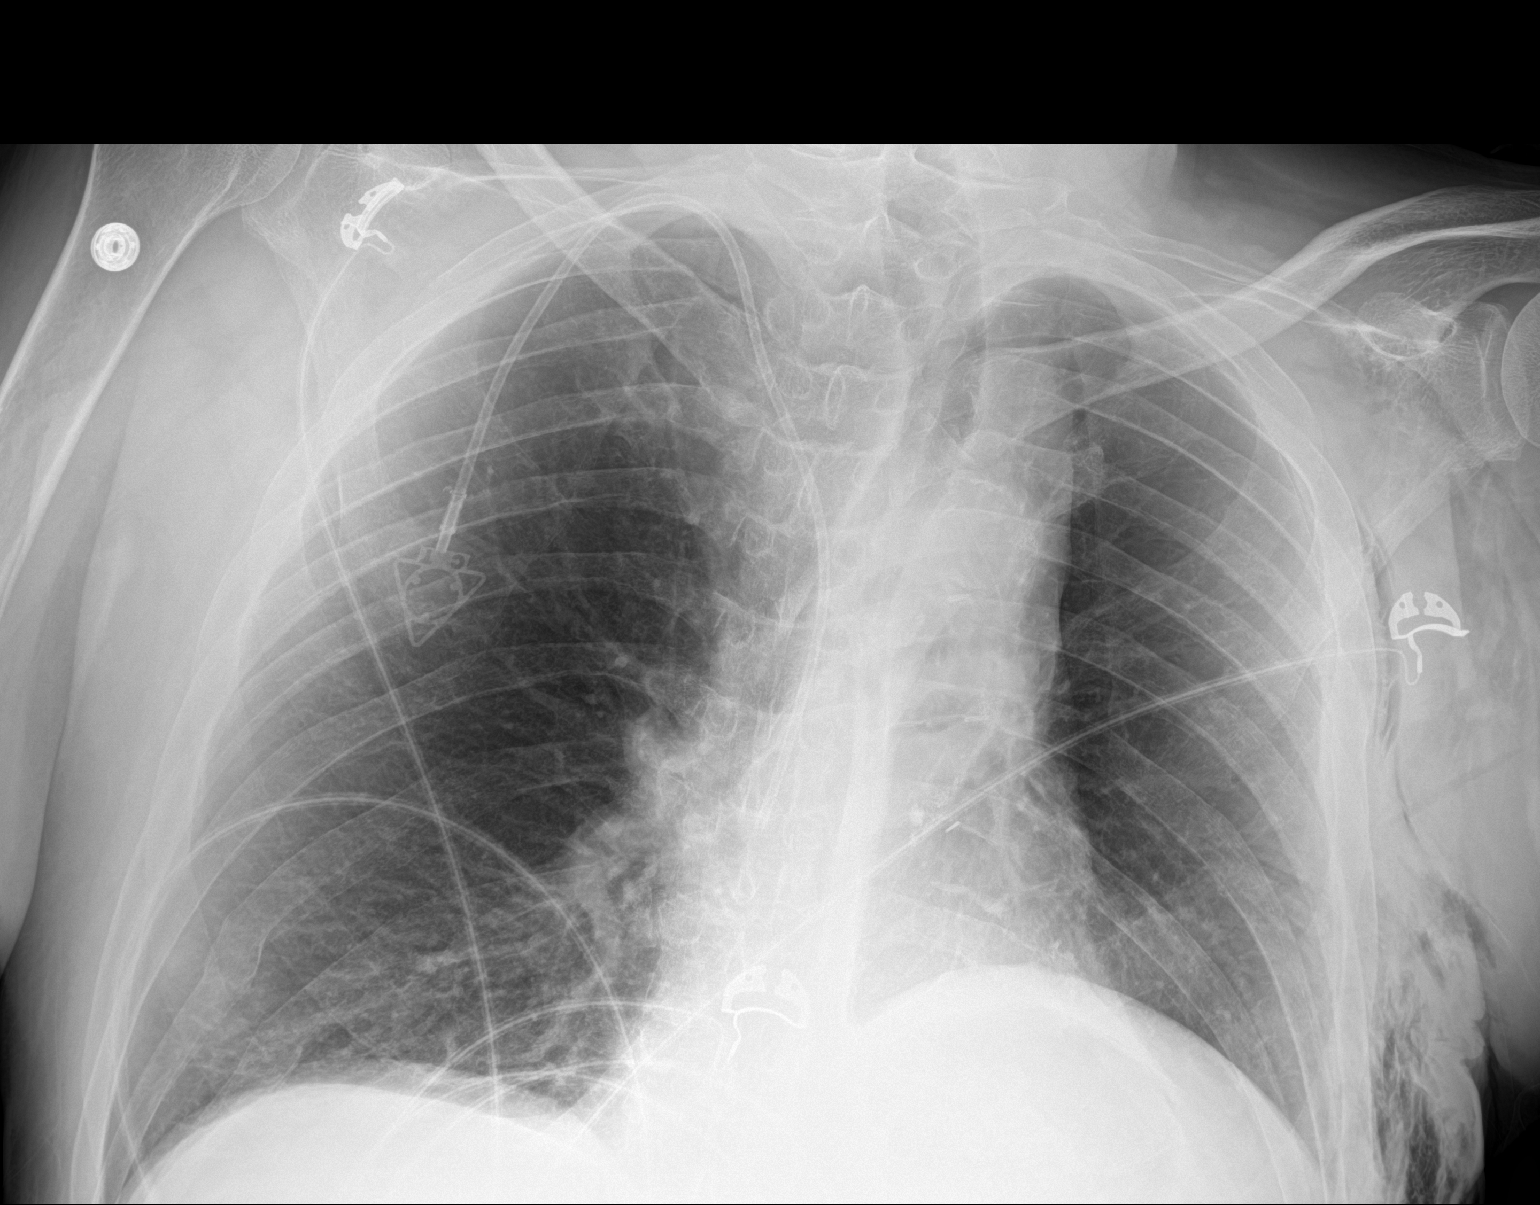

[1 of 1 positions shown; findings below may reference images not displayed]

FINDINGS: Right Port-A-Cath tip: SVC. The patient is rotated to the left on
today's radiograph, reducing diagnostic sensitivity and specificity.
Subcutaneous emphysema along the left chest.

There is a miniscule left lateral pneumothorax, about [DATE] sent of
hemithoracic volume.

Atherosclerotic calcification of the aortic arch.

Old bilateral rib deformities. The previous right basilar
subsegmental atelectasis is mildly reduced.
IMPRESSION: 1. Approximally 2% left apicolateral pneumothorax, s/the chest tube
removal.
2. Stable subcutaneous emphysema on the left.
3.  Aortic Atherosclerosis ([CQ]-[CQ]).

## 2021-04-23 IMAGING — DX DG CHEST 1V PORT
1 series · 1 of 1 positions shown · non-contrast
Comparison: One day prior

CLINICAL DATA: Follow-up of lung surgery.  Chest soreness.

EXAM:
PORTABLE CHEST 1 VIEW

[chest ap]
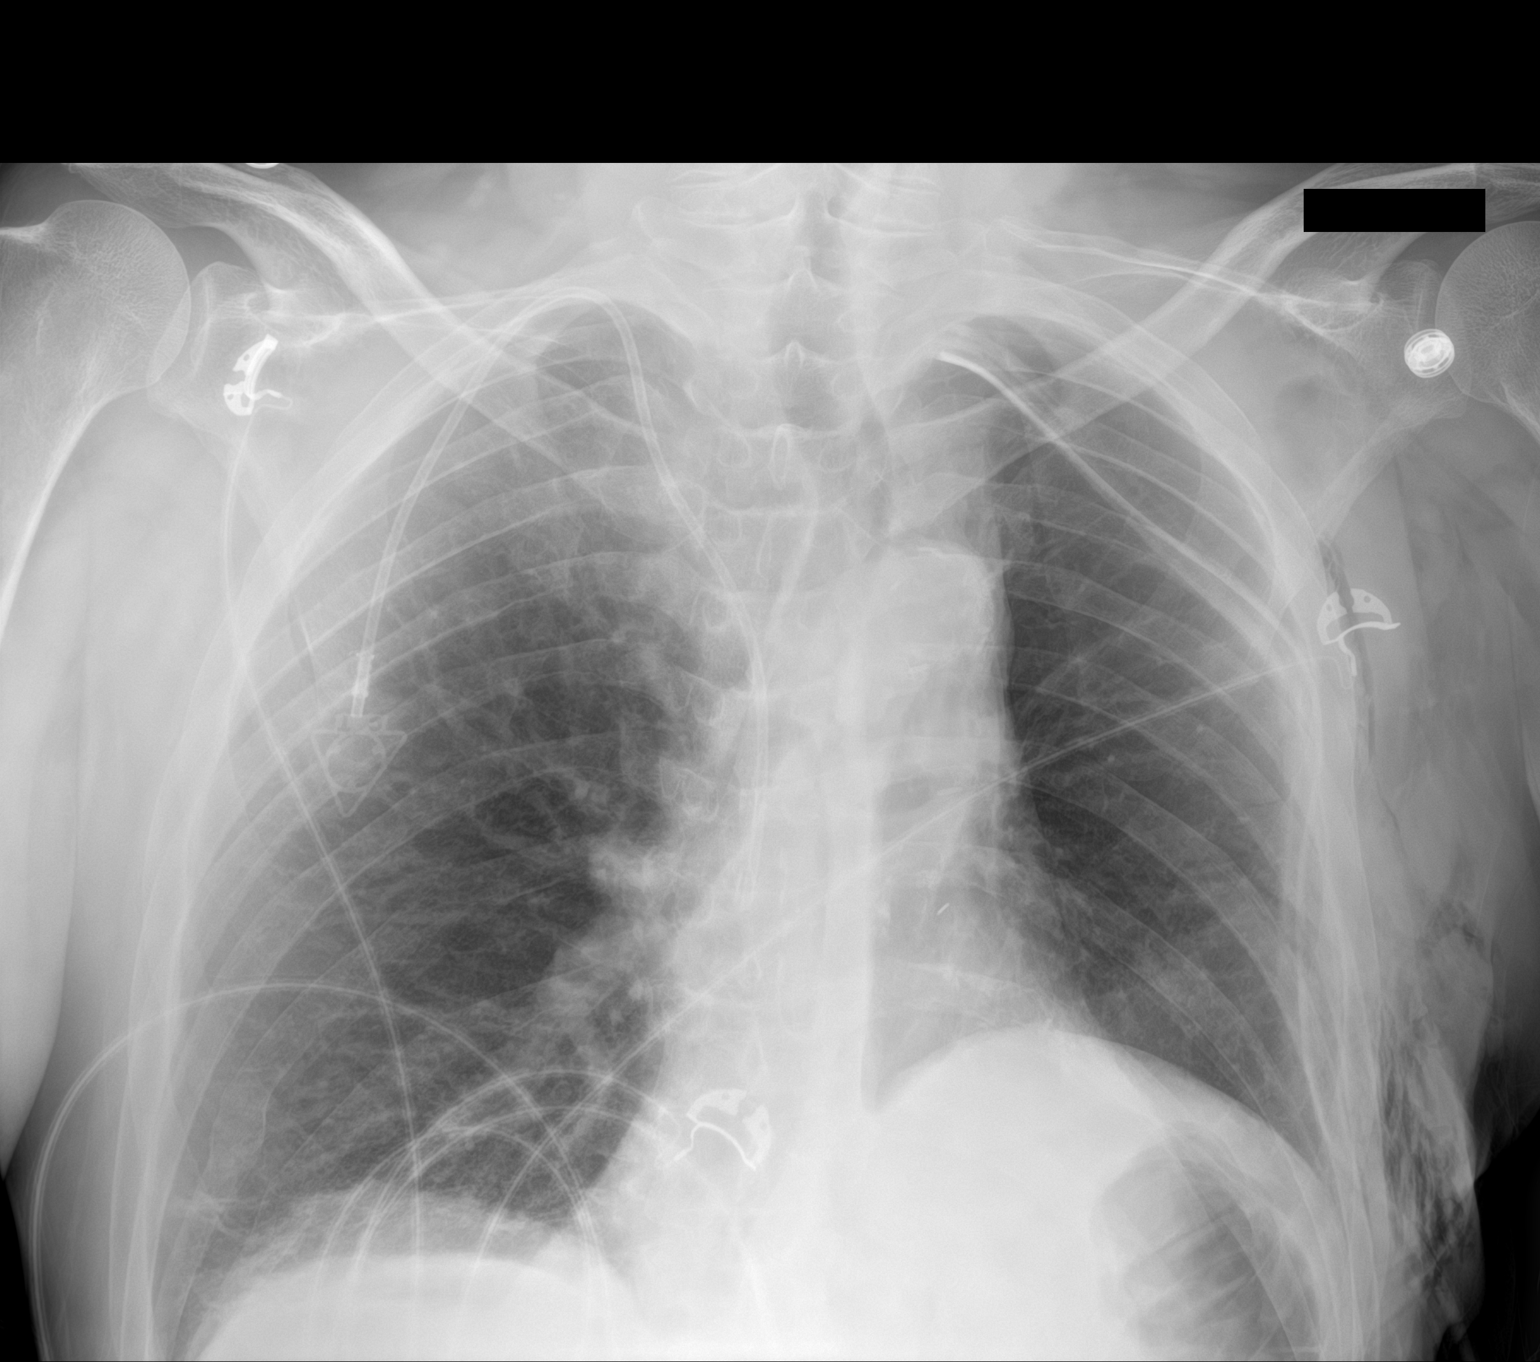

[1 of 1 positions shown; findings below may reference images not displayed]

FINDINGS: Right Port-A-Cath tip at superior caval/atrial junction. Tracheal
deviation left secondary to volume loss in the left hemithorax with
left hemidiaphragm elevation.

Normal heart size. Atherosclerosis in the transverse aorta. Left
chest tube is similar in position with tip in the apex left
hemithorax. Subcutaneous emphysema about the left chest wall is
slightly increased. No pneumothorax. Developing right base
atelectasis. Remote right rib fracture.
IMPRESSION: Left chest tube in place, without evidence of pneumothorax. There is
increase in subcutaneous emphysema about the left chest wall.

Developing right base atelectasis.

Aortic Atherosclerosis ([XJ]-[XJ]).

## 2021-04-23 MED ORDER — ACETAMINOPHEN 500 MG PO TABS: 1000.0000 mg | ORAL_TABLET | Freq: Four times a day (QID) | ORAL | 0 refills | Status: DC | PRN

## 2021-04-23 MED ORDER — OXYCODONE-ACETAMINOPHEN 10-325 MG PO TABS: 1.0000 | ORAL_TABLET | ORAL | 0 refills | Status: AC | PRN

## 2021-04-23 NOTE — Discharge Summary (Addendum)
Physician Discharge Summary  Patient ID: Bryan Fleming MRN: [id number] DOB/AGE: 59-Mar-1962 59 y.o.  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   Joint pain 02/03/2021   Nausea without vomiting 02/03/2021   Primary lung squamous cell carcinoma, left ([place]) 01/01/2021   Port-A-Cath in place 01/01/2021   Cavitating mass in left upper lung lobe 12/17/2020   Acute pancreatitis 06/04/2020   Leukocytosis 06/04/2020   Hypokalemia 06/04/2020   Elevated liver function tests 02/02/2016   Absolute anemia 02/02/2016   Alcohol abuse 08/30/2015   Anxiety state 08/30/2015   Nicotine dependence, cigarettes, with other nicotine-induced disorders 08/30/2015   Hepatitis 08/31/2011   Hypertension    GERD (gastroesophageal reflux disease)    COPD (chronic obstructive pulmonary disease) ([place])    Discharge Diagnoses: Squamous cell carcinoma pathologic stage IIB (ypT2b, N1)  Patient Active Problem List   Diagnosis Date Noted   Lung mass 04/21/2021   S/P Left Video Assisted Thorascopy with Left Upper Lobectomy, Lypmh Node Dissection, Intercostal Nerve block 04/21/2021   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   Joint pain 02/03/2021   Nausea without vomiting 02/03/2021   Primary lung squamous cell carcinoma, left ([place]) 01/01/2021   Port-A-Cath in place 01/01/2021   Cavitating mass in left upper lung lobe 12/17/2020   Acute pancreatitis 06/04/2020   Leukocytosis 06/04/2020   Hypokalemia 06/04/2020   Elevated liver function tests 02/02/2016   Absolute anemia 02/02/2016   Alcohol abuse 08/30/2015   Anxiety state 08/30/2015   Nicotine dependence, cigarettes, with other nicotine-induced disorders 08/30/2015   Hepatitis 08/31/2011   Hypertension    GERD  (gastroesophageal reflux disease)    COPD (chronic obstructive pulmonary disease) ([place])    Discharged Condition: good  History of Present Illness:  Bryan Fleming is a 60 year old man with a history of tobacco abuse, COPD, ethanol abuse, alcoholic hepatitis, pancreatitis, hypertension, hyperlipidemia, reflux, and depression.  He has about a 50-pack-year history of smoking (1.5 packs/day for x37 years).  He recently had a low-dose screening CT which showed a left upper lobe mass.  He was referred to Dr. Halford Chessman.  He did a PET/CT which showed the mass was hypermetabolic.  There also was a suspicious AP window lymph node.  There was some questionable activity in the chest wall.  He underwent navigational bronchoscopy by Dr. Lamonte Sakai.  Biopsy showed squamous cell carcinoma.  The lymph node was not accessible for sampling.  Brain MRI was negative for metastases.  He was evaluated by Dr. Delton Coombes and due to the size of the mass it was felt chemotherapy would be indicated first.  He completed this and was closely followed by Dr. Roxan Hockey, with most recent appointment being on 04/08/2021.  At which time it was felt patient could undergo surgical resection of his left upper lobe.  The risks and benefits of the procedure were explained to the patient and he was agreeable to proceed.   Hospital Course:  Bryan Fleming presented to [facility] on 04/21/2021.  He was taken to the operating room and underwent Left Video Assisted Thoracoscopy with Left Upper Lobectomy, Lymph Node Dissection, and Intercostal Nerve Block.  The patient tolerated the procedure without difficulty, was extubated, and taken to the PACU in stable condition.  The patient's arterial line and foley catheter were removed without difficulty.  His chest tube was free from air leak with minimal output.  CXR showed a trace left lateral pneumothorax.   He was hypertensive and treated with his home Norvasc.  The patient's chest tube output  remained low.  CXR was stable without significant pneumothorax.  His chest tube was removed on 04/23/2021.  Follow up CXR showed clear lung fields with a tiny left lateral air space.  The patient remains clinically stable.  His blood pressure is well controlled.  His surgical incisions are healing without evidence of infection.  He does have some swelling along his left pec due to sub cutaneous emphysema.  This will resolve with time. He is felt medically stable for discharge home today.  Consults: None  Significant Diagnostic Studies: radiology:   CT scan:    IMPRESSION: Decreased size of left upper lobe cavitary mass.   Decreased size of AP window lymph node.   Three-vessel coronary artery calcifications and calcifications of the aortic valve.     CLINICAL DATA:  Chest tube removal   EXAM: CHEST - 1 VIEW SAME DAY   COMPARISON:  04/23/2021 6:16 a.m.   FINDINGS: Right Port-A-Cath tip: SVC. The patient is rotated to the left on today's radiograph, reducing diagnostic sensitivity and specificity. Subcutaneous emphysema along the left chest.   There is a miniscule left lateral pneumothorax, about 2/10 sent of hemithoracic volume.   Atherosclerotic calcification of the aortic arch.   Old bilateral rib deformities. The previous right basilar subsegmental atelectasis is mildly reduced.   IMPRESSION: 1. Approximally 2% left apicolateral pneumothorax, s/the chest tube removal. 2. Stable subcutaneous emphysema on the left. 3.  Aortic Atherosclerosis (ICD10-I70.0).     Electronically Signed   By: Van Clines M.D.   On: 04/23/2021 14:55      Treatments: surgery:   Operative Report    DATE OF PROCEDURE: 04/21/2021     PREOPERATIVE DIAGNOSIS:  Non-small cell carcinoma, left upper lobe.   POSTOPERATIVE DIAGNOSIS:  Non-small cell carcinoma, left upper lobe.   PROCEDURE:  Left video-assisted thoracoscopy, left upper lobectomy, lymph node sampling, intercostal  nerve blocks at levels 3 through 10.   SURGEON:  Revonda Standard. Roxan Hockey, MD   ASSISTANT: Ellwood Handler, PA-C  PATHOLOGY:  Discharge Exam: Blood pressure (!) 146/71, pulse (!) 103, temperature 98.7 F (37.1 C), temperature source Oral, resp. rate 16, height _0  (1.753 m), weight 71.7 kg, SpO2 98 %.  General appearance: alert, cooperative, and no distress Heart: regular rate and rhythm Lungs: clear to auscultation bilaterally Abdomen: soft, non-tender; bowel sounds normal; no masses,  no organomegaly Extremities: extremities normal, atraumatic, no cyanosis or edema Wound: clean and dry, some swelling along left pec  Disposition: Discharged to home in stable condition.   Allergies as of 04/23/2021       Reactions   Other Nausea Only, Other (See Comments)   No spicy foods- Patient cannot tolerate- HEARTBURN        Medication List     STOP taking these medications    predniSONE 5 MG tablet Commonly known as: DELTASONE   prochlorperazine 10 MG tablet Commonly known as: COMPAZINE       TAKE these medications    acetaminophen 500 MG tablet Commonly known as: TYLENOL Take 2 tablets (1,000 mg total) by mouth every 6 (six) hours as needed.   amLODipine 5 MG tablet Commonly known as: NORVASC Take 5 mg by mouth daily.   aspirin 81 MG tablet Take 81 mg by mouth daily.  CARBOPLATIN IV Inject into the vein every 21 ( twenty-one) days.   gabapentin 300 MG capsule Commonly known as: NEURONTIN Take 1 capsule (300 mg total) by mouth 3 (three) times daily.   lidocaine-prilocaine cream Commonly known as: EMLA Apply small amount to port a cath site and cover with plastic wrap 1 hour prior to infusion appointments   multivitamin with minerals Tabs tablet Take 1 tablet by mouth daily.   naloxone 4 MG/0.1ML Liqd nasal spray kit Commonly known as: NARCAN Spray 0.1 mL (contents of one device) into one nostril upon signs of opioid overdose.  Call 911.  May repeat once if  no response within 2-3 minutes.   NIVOLUMAB IV Inject 360 mg into the vein every 21 ( twenty-one) days.   omeprazole 40 MG capsule Commonly known as: PRILOSEC Take 40 mg by mouth daily.   Oxycodone HCl 10 MG Tabs Take 1 tablet (10 mg total) by mouth every 4 (four) hours as needed for severe pain.   PACLITAXEL IV Inject 200 mg/m2 into the vein every 21 ( twenty-one) days.   Proventil HFA 108 (90 Base) MCG/ACT inhaler Generic drug: albuterol INHALE 2 PUFFS BY MOUTH EVERY 6 HOURS AS NEEDED FOR COUGHING, WHEEZING, OR SHORTNESS OF BREATH   Trelegy Ellipta 100-62.5-25 MCG/INH Aepb Generic drug: Fluticasone-Umeclidin-Vilant Inhale 1 puff into the lungs daily in the afternoon.        Follow-up Information     Melrose Nakayama, MD Follow up on 05/04/2021.   Specialty: Cardiothoracic Surgery Why: Appointment is at 10:45, please get CXR at 10:15 at [facility] located on first floor of our office building Contact information: [place] [address] [phone]                 Signed: Antony Odea, PA-C  04/23/2021, 3:03 PM

## 2021-04-23 NOTE — Discharge Instructions (Signed)

## 2021-04-23 NOTE — Progress Notes (Addendum)
      [address]       [place] [address]             [phone]      2 Days Post-Op Procedure(s) (LRB): VIDEO ASSISTED THORACOSCOPY (VATS)/ LOBECTOMY (Left) INTERCOSTAL NERVE BLOCK  Subjective:  No new complaints.  Continues to have some pain.    Objective: Vital signs in last 24 hours: Temp:  [98.4 F (36.9 C)-98.7 F (37.1 C)] 98.7 F (37.1 C) (09/16 0737) Pulse Rate:  [86-103] 103 (09/16 0737) Cardiac Rhythm: Sinus tachycardia (09/15 1954) Resp:  [16-20] 16 (09/16 0737) BP: (144-159)/(71-94) 146/71 (09/16 0737) SpO2:  [92 %-100 %] 97 % (09/16 0737)  Intake/Output from previous day: 09/15 0701 - 09/16 0700 In: 960 [P.O.:960] Out: 1432 [Urine:1300; Chest Tube:132]  General appearance: alert, cooperative, and no distress Heart: regular rate and rhythm Lungs: clear to auscultation bilaterally Abdomen: soft, non-tender; bowel sounds normal; no masses,  no organomegaly Extremities: extremities normal, atraumatic, no cyanosis or edema Wound: clean and dry, some swelling along left pec  Lab Results: Recent Labs    04/22/21 0313 04/23/21 0023  WBC 14.1* 13.0*  HGB 12.9* 11.8*  HCT 39.2 35.8*  PLT 291 267   BMET:  Recent Labs    04/22/21 0313 04/23/21 0023  NA 133* 135  K 4.0 3.6  CL 100 99  CO2 23 30  GLUCOSE 207* 127*  BUN 6 6  CREATININE 0.74 0.63  CALCIUM 9.1 8.9    PT/INR: No results for input(s): LABPROT, INR in the last 72 hours. ABG    Component Value Date/Time   PHART 7.449 04/19/2021 1454   HCO3 25.6 04/19/2021 1454   O2SAT 96.3 04/19/2021 1454   CBG (last 3)  No results for input(s): GLUCAP in the last 72 hours.  Assessment/Plan: S/P Procedure(s) (LRB): VIDEO ASSISTED THORACOSCOPY (VATS)/ LOBECTOMY (Left) INTERCOSTAL NERVE BLOCK  CV- NSR, H/O HTN- continue Norvasc Pulm- CT w/o air leak, 50 cc output yesterday, CXR overall looks okay, suspect we will remove chest tube today Renal- creatinine, lytes okay Lovenox  for DVT prophylaxis Dispo- patient stable, no air leak from chest tube on water-seal, output remains low, suspect removal today, patient would like to go home today if chest tube is removed, will defer to Dr. Roxan Hockey   LOS: 2 days   Ellwood Handler, PA-C 04/23/2021 Patient seen and examined, agree with above He is anxious to go home Nicoderm patch for nicotine dependence No air leak and minimal drainage- dc chest tube Possibly home later today if ambulating well, etc  Remo Lipps C. Roxan Hockey, MD [facility] [phone]

## 2021-04-24 DIAGNOSIS — R911 Solitary pulmonary nodule: Secondary | ICD-10-CM | POA: Diagnosis not present

## 2021-04-24 DIAGNOSIS — I10 Essential (primary) hypertension: Secondary | ICD-10-CM | POA: Diagnosis not present

## 2021-04-29 ENCOUNTER — Other Ambulatory Visit: Payer: Self-pay | Admitting: *Deleted

## 2021-04-29 NOTE — Progress Notes (Signed)
The proposed treatment discussed in conference is for discussion purpose only and is not a binding recommendation. The patient was not been physically examined, or presented with their treatment options. Therefore, final treatment plans cannot be decided.  

## 2021-04-30 ENCOUNTER — Other Ambulatory Visit: Payer: Self-pay | Admitting: Thoracic Surgery (Cardiothoracic Vascular Surgery)

## 2021-04-30 ENCOUNTER — Other Ambulatory Visit (HOSPITAL_COMMUNITY): Payer: Self-pay

## 2021-04-30 DIAGNOSIS — C3492 Malignant neoplasm of unspecified part of left bronchus or lung: Secondary | ICD-10-CM

## 2021-04-30 DIAGNOSIS — R918 Other nonspecific abnormal finding of lung field: Secondary | ICD-10-CM

## 2021-05-03 ENCOUNTER — Encounter (HOSPITAL_COMMUNITY): Payer: Self-pay | Admitting: Hematology

## 2021-05-03 ENCOUNTER — Other Ambulatory Visit (HOSPITAL_COMMUNITY): Payer: Self-pay | Admitting: *Deleted

## 2021-05-03 DIAGNOSIS — C3492 Malignant neoplasm of unspecified part of left bronchus or lung: Secondary | ICD-10-CM

## 2021-05-03 LAB — SURGICAL PATHOLOGY

## 2021-05-03 MED ORDER — OXYCODONE HCL 10 MG PO TABS: 10.0000 mg | ORAL_TABLET | ORAL | 0 refills | Status: DC | PRN

## 2021-05-04 ENCOUNTER — Ambulatory Visit: Payer: Self-pay | Admitting: Thoracic Surgery (Cardiothoracic Vascular Surgery)

## 2021-05-05 ENCOUNTER — Ambulatory Visit
Admission: RE | Admit: 2021-05-05 | Discharge: 2021-05-05 | Disposition: A | Discharge status: Home / Self Care | Payer: Medicare PPO | Source: Ambulatory Visit | Attending: Thoracic Surgery (Cardiothoracic Vascular Surgery) | Admitting: Thoracic Surgery (Cardiothoracic Vascular Surgery)

## 2021-05-05 ENCOUNTER — Ambulatory Visit (INDEPENDENT_AMBULATORY_CARE_PROVIDER_SITE_OTHER): Payer: Self-pay | Admitting: Physician Assistant

## 2021-05-05 ENCOUNTER — Other Ambulatory Visit: Payer: Self-pay

## 2021-05-05 VITALS — BP 105/76 | HR 128 | Resp 20 | Ht 69.0 in | Wt 156.0 lb

## 2021-05-05 DIAGNOSIS — Z09 Encounter for follow-up examination after completed treatment for conditions other than malignant neoplasm: Secondary | ICD-10-CM

## 2021-05-05 DIAGNOSIS — R918 Other nonspecific abnormal finding of lung field: Secondary | ICD-10-CM

## 2021-05-05 DIAGNOSIS — J984 Other disorders of lung: Secondary | ICD-10-CM

## 2021-05-05 DIAGNOSIS — J90 Pleural effusion, not elsewhere classified: Secondary | ICD-10-CM | POA: Diagnosis not present

## 2021-05-05 DIAGNOSIS — J939 Pneumothorax, unspecified: Secondary | ICD-10-CM | POA: Diagnosis not present

## 2021-05-05 DIAGNOSIS — I700 Atherosclerosis of aorta: Secondary | ICD-10-CM | POA: Diagnosis not present

## 2021-05-05 DIAGNOSIS — J439 Emphysema, unspecified: Secondary | ICD-10-CM | POA: Diagnosis not present

## 2021-05-05 NOTE — Progress Notes (Signed)
HPI Patient with localized very pleasant 60 year old gentleman with past history of tobacco abuse, COPD, ethanol abuse, alcoholic  Hepatitis, pancreatitis, hypertension, dyslipidemia, reflux, depression.  He had a 50-pack-year smoking history and was recently diagnosed by low-dose screening CT to have a left upper lobe mass.  He underwent navigational bronchoscopy by Dr. Lamonte Sakai and was proven to have squamous cell carcinoma.  He was evaluated by Dr. Fransisca Kaufmann got to and he recommended proceeding with chemotherapy prior to lung resection.  After course of chemotherapy, we arranged for left upper lobectomy.  This was carried out on 04/21/2021.  Please see the path report below.  Postoperative course was uncomplicated and he was discharged on postop day 2.  Since discharge home, Mr. Schranz feels he is continuing to progress.  He denies any cough, fever, or shortness of breath.  He continues to have some soreness in the incision and into his left shoulder but this is gradually improving.  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 30 tablet 0   amLODipine (NORVASC) 5 MG tablet Take 5 mg by mouth daily.     aspirin 81 MG tablet Take 81 mg by mouth daily.     CARBOPLATIN IV Inject into the vein every 21 ( twenty-one) days.     Fluticasone-Umeclidin-Vilant (TRELEGY ELLIPTA) 100-62.5-25 MCG/INH AEPB Inhale 1 puff into the lungs daily in the afternoon.     gabapentin (NEURONTIN) 300 MG capsule Take 1 capsule (300 mg total) by mouth 3 (three) times daily. 90 capsule 3   lidocaine-prilocaine (EMLA) cream Apply small amount to port a cath site and cover with plastic wrap 1 hour prior to infusion appointments 30 g 3   Multiple Vitamin (MULTIVITAMIN WITH MINERALS) TABS tablet Take 1 tablet by mouth daily.     naloxone (NARCAN)  nasal spray 4 mg/0.1 mL Spray 0.1 mL (contents of one device) into one nostril upon signs of opioid overdose.  Call 911.  May repeat once if no response within 2-3 minutes. 2 each 0   NIVOLUMAB IV Inject 360 mg into the vein every 21 ( twenty-one) days.     omeprazole (PRILOSEC) 40 MG capsule Take 40 mg by mouth daily.     Oxycodone HCl 10 MG TABS Take 1 tablet (10 mg total) by mouth every 4 (four) hours as needed. 180 tablet 0   PACLITAXEL IV Inject 200 mg/m2 into the vein every 21 ( twenty-one) days.     PROVENTIL HFA 108 (90 Base) MCG/ACT inhaler INHALE 2 PUFFS BY MOUTH EVERY 6 HOURS AS NEEDED FOR COUGHING, WHEEZING, OR SHORTNESS OF BREATH 20.1 g 1   No current facility-administered medications for this visit.       Physical Exam  BP 105/76 Pulse 100 Respirations 20 O2 sat 94%    Heart-Regular rate and rhythm  Chest-breath sounds are clear to auscultation, full and equal. The mini-thoracotomy incision is healing well.  There is expected surrounding ecchymosis and mild tenderness.  Chest tube site is healing well.  2 black silk sutures were removed.  Diagnostic Tests: CLINICAL DATA:  LEFT lung mass post VATS on 04/21/2021   EXAM: CHEST - 2 VIEW   COMPARISON:  04/23/2021   FINDINGS: RIGHT subclavian Port-A-Cath with tip projecting over SVC.   Normal heart size, mediastinal contours, and pulmonary vascularity.   Atherosclerotic calcification aorta.  Volume loss LEFT hemithorax with elevation of LEFT diaphragm and mild mediastinal shift to LEFT.   Underlying emphysematous changes.   No acute infiltrate or pneumothorax; pneumothorax seen on previous exam resolved.   Minimal bibasilar effusions.   Osseous structures unremarkable.   IMPRESSION: Postsurgical changes of the LEFT hemithorax.   No acute abnormalities.     Electronically Signed   By: Lavonia Dana M.D.   On: 05/05/2021 13:32   SURGICAL PATHOLOGY SURGICAL PATHOLOGY   THIS IS AN AMENDED REPORT    CASE: [id number]  PATIENT: [facility]  Surgical Pathology Report    Amendment   Reason for Amendment #1: Other   Clinical History: left upper lobe squamous cell carcinoma (cm)    FINAL MICROSCOPIC DIAGNOSIS:   A. LUNG, LEFT UPPER LOBE, LOBECTOMY:  - Invasive moderately differentiated squamous cell carcinoma, 5 cm  - Resection margins negative for carcinoma  - Visceral pleura is not involved  - Metastatic squamous cell carcinoma to one of two hilar lymph nodes  (1/2)  - See oncology table   B. LYMPH NODE, LEVEL 5, EXCISION:  - Lymph node, negative for carcinoma (0/1)   C. LYMPH NODE, 11, EXCISION:  - Lymph node, negative for carcinoma (0/1)   D. LYMPH NODE, 12, EXCISION:  - Lymph node, negative for carcinoma (0/1)   E. LYMPH NODE, 10, EXCISION:  - Lymph node, negative for carcinoma (0/1)   F. LYMPH NODE, 11 #2, EXCISION:  - Lymph node, negative for carcinoma (0/1)   G. LYMPH NODE, 12 #2, EXCISION:  - Lymph node, negative for carcinoma (0/1)      ONCOLOGY TABLE:   LUNG: Resection   Synchronous Tumors: Not applicable  Total Number of Primary Tumors: 1  Procedure: Lobectomy  Specimen Laterality: Left  Tumor Focality: Unifocal  Tumor Site: Upper lobe  Tumor Size: 5 cm  Histologic Type: Squamous cell carcinoma, moderately differentiated  Visceral Pleura Invasion: Not identified  Direct Invasion of Adjacent Structures: No adjacent structures present  Treatment Effect: No definite tumor response identified  Lymphovascular Invasion: Not identified  Margins: All margins negative for invasive carcinoma       Closest Margin(s) to Invasive Carcinoma: Bronchial margin  Treatment Effect: No known presurgical therapy  Regional Lymph Nodes:       Number of Lymph Nodes Involved:    1                        Nodal Sites with Tumor:    Level 10       Number of Lymph Nodes Examined: 8                         Nodal Sites Examined:     Levels 5, 10, 11 and 12   Distant Metastasis:       Distant Site(s) Involved: Not applicable  Pathologic Stage Classification (pTNM, AJCC 8th Edition): ypT2b, ypN1  Ancillary Studies: Can be performed upon request  Representative Tumor Block: A5  Comment(s): None   (v4.2.0.1)     INTRAOPERATIVE DIAGNOSIS:   A1. LUNG, LEFT UPPER LOBE BRONCHIAL MARGIN, FROZEN SECTION:         Negative for carcinoma.         Rapid intraoperative consult diagnosis rendered by Dr. Orene Desanctis @  [place] 04/21/2021.    AMENDMENT NOTE:   The prefix 'y' was added to the pathologic TNM stage as the patient had  neoadjuvant therapy.  Dr. Roxan Hockey was notified on 05/03/2010.      GROSS DESCRIPTION:   Specimen A: Lung, left upper lobe, received fresh for rapid  intraoperative consult evaluation of bronchial margin by frozen section.  Specimen integrity: Intact  Size, weight: 338 g, 20 x 12 x 4 cm  Pleura: Pink-purple with scattered moderate anthracosis.  Over the  anterior segment is a 4.2 x 2.7 cm area of indurated and slightly  umbilicated pleura.  This area is inked black.  Lesion: On sectioning through the area of indurated and umbilicated  pleura there is an underlying 5 x 4 x 3.2 cm gray-white to tan-red  centrally cavitary and friable ill-defined mass which grossly involves  overlying pleura.  Margin(s): The mass is 3 cm from the bronchial margin.  Hilar vessels: Patent, grossly uninvolved.  Nonneoplastic parenchyma: Pink-red, spongy, diffusely anthracotic.  There is focal emphysematous change within the apical segment.  Lymph nodes: At the hilum is a 1.2 cm possible soft anthracotic node.  Block Summary:  Block 1 = shave of bronchial margin submitted for frozen section.  Blocks 2, 3 = tumor with overlying pleura  Blocks 4, 5 = tumor and adjacent parenchyma  Block 6 = section from apical segment with possible emphysematous change  Block 7 = vascular margins  Block 8 = 1 possible node, sectioned   Specimen B: Received  fresh labeled level 5 node is a 1.9 x 1.8 x 0.3 cm  aggregate of yellow-pink to anthracotic soft tissue, submitted 1 block.   Specimen C: Received fresh labeled level 11 node is a 0.6 cm soft  anthracotic nodule, in toto 1 block.   Specimen D: Received fresh labeled level 12 node is a 0.6 cm soft  anthracotic nodule, in toto 1 block.   Specimen E: Received fresh labeled level 10 node is a 1 x 1 x 0.25 cm  aggregate of soft anthracotic tissue, submitted in 1 block.   Specimen F: Received fresh labeled level 11 node #2, is a 0.8 x 0.6 x  0.2 cm aggregate of pink-red to anthracotic soft tissue, submitted 1  block.   Specimen G: Received fresh labeled level 12 node #2 is a 1.1 x 0.8 x  0.25 cm aggregate of pink-red to anthracotic soft tissue, submitted 1  block.   SW 04/22/2021    Final Diagnosis performed by Jaquita Folds, MD.   Electronically  signed 04/23/2021  Amendment #1 performed by Jaquita Folds, MD.   Electronically signed  05/03/2021  Technical component performed at [facility]. [facility], [place] [address], [place], [place] [address].   Professional component performed at [facility],  [place] [address] [place], [place] [address].   Immunohistochemistry Technical component (if applicable) was performed  at [facility]. [address] [place],  [place], [place] [address].   IMMUNOHISTOCHEMISTRY DISCLAIMER (if applicable):  Some of these immunohistochemical stains may have been developed and the  performance characteristics determine by [facility]. Some  may not have been cleared or approved by the U.S. Food and Drug  Administration. The FDA has determined that such clearance or approval  is not necessary. This test is used for clinical purposes. It should not  be regarded as investigational or for research. This laboratory is  certified under the [employer]  (CLIA-88) as  qualified to perform high complexity clinical laboratory  testing.  The controls stained appropriately.      Impression / Plan:  Mr. Cherian is progressing well following left video-assisted thoracoscopy for left upper lobectomy with mediastinal lymph node sampling.  This procedure followed chemotherapy treatment by Dr. Delton Coombes for clinical stage IIIa squamous cell carcinoma.  He is progressing well and chest x-ray today has no complicating features.  He has scheduled follow-up with Dr. Delton Coombes next week. No further scheduled follow-up is planned with our practice as of now but I will confirm this with Dr. Roxan Hockey.    Antony Odea, PA-C [facility] [phone]

## 2021-05-05 NOTE — Patient Instructions (Signed)
May gradually increase activity as tolerated. Keep incisions clean and dry until healed.

## 2021-05-10 NOTE — Progress Notes (Signed)
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  PRIOR THERAPY: carboplatin, paclitaxel and nivolumab q21d x3 cycles  NGS Results: not done  CURRENT THERAPY: surveillance  BRIEF ONCOLOGIC HISTORY:  Oncology History  Primary lung squamous cell carcinoma, left ([place])  12/31/2020 Cancer Staging   Staging form: Lung, AJCC 8th Edition - Clinical stage from 12/31/2020: Stage IIIA (cT2b, cN2, cM0) - Signed by Derek Jack, MD on 01/01/2021 Stage prefix: Initial diagnosis   01/01/2021 Initial Diagnosis   Primary lung squamous cell carcinoma, left ([place])   01/13/2021 -  Chemotherapy    Patient is on Treatment Plan: LUNG NSCLC CARBOPLATIN / PACLITAXEL Q21D X 3 CYCLES         CANCER STAGING: Cancer Staging Primary lung squamous cell carcinoma, left ([place]) Staging form: Lung, AJCC 8th Edition - Clinical stage from 12/31/2020: Stage IIIA (cT2b, cN2, cM0) - Signed by Derek Jack, MD on 01/01/2021   INTERVAL HISTORY:  Bryan Fleming, a 60 y.o. 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 Bryan Fleming was last seen on 03/17/2021.   Today he reports feeling well. He reports numbness and tingling in his fingers and feet, and he also reports pain in his ankles, knees, wrists, and hips for which he takes oxycodone as needed and 4 prednisone daily. His appetite is fluctuating but overall good.   REVIEW OF SYSTEMS:  Review of Systems  Constitutional:  Negative for appetite change (50%) and fatigue (50%).  Respiratory:  Positive for cough and shortness of breath.   Gastrointestinal:  Positive for diarrhea and nausea.  Musculoskeletal:  Positive for arthralgias.  Neurological:  Positive for  numbness.  All other systems reviewed and are negative.  PAST MEDICAL/SURGICAL HISTORY:  Past Medical History:  Diagnosis Date   Cancer [place])    Lung cancer   COPD (chronic obstructive pulmonary disease) ([place]) 2007   EMPHYSEMA   Depression    Dyspnea    occasional - no oxygen   GERD (gastroesophageal reflux disease) 2010   Hepatitis    Alcoholic    HLD (hyperlipidemia)    no meds   Hypertension    Port-A-Cath in place 01/01/2021   Past Surgical History:  Procedure Laterality Date   BRONCHIAL BIOPSY  12/23/2020   Procedure: BRONCHIAL BIOPSIES;  Surgeon: [name], MD;  Location: [facility];  Service: Pulmonary;;   BRONCHIAL BRUSHINGS  12/23/2020   Procedure: BRONCHIAL BRUSHINGS;  Surgeon: [name], MD;  Location: [place];  Service: Pulmonary;;   BRONCHIAL NEEDLE ASPIRATION BIOPSY  12/23/2020   Procedure: BRONCHIAL NEEDLE ASPIRATION BIOPSIES;  Surgeon: [name], MD;  Location: [place];  Service: Pulmonary;;   BRONCHIAL WASHINGS  12/23/2020   Procedure: BRONCHIAL WASHINGS;  Surgeon: [name], MD;  Location: [facility];  Service: Pulmonary;;   CARDIAC CATHETERIZATION  2010   COLONOSCOPY  03/14/2012   Procedure: COLONOSCOPY;  Surgeon: Rogene Houston, MD;  [facility];  Service: Endoscopy;  Laterality: N/A;  100   INTERCOSTAL NERVE BLOCK  04/21/2021   Procedure: INTERCOSTAL NERVE BLOCK;  Surgeon: Melrose Nakayama, MD;  Location: [place];  Service: Thoracic;;   IR IMAGING GUIDED PORT INSERTION  01/12/2021   VIDEO ASSISTED THORACOSCOPY (  VATS)/ LOBECTOMY Left 04/21/2021   Procedure: VIDEO ASSISTED THORACOSCOPY (VATS)/ LOBECTOMY;  Surgeon: Melrose Nakayama, MD;  Location: [place];  Service: Thoracic;  Laterality: Left;   VIDEO BRONCHOSCOPY WITH ENDOBRONCHIAL NAVIGATION N/A 12/23/2020   Procedure: VIDEO BRONCHOSCOPY WITH ENDOBRONCHIAL NAVIGATION;  Surgeon: [name], MD;  Location: [place] ENDOSCOPY;  Service: Pulmonary;  Laterality: N/A;    WRIST GANGLION EXCISION  1985    SOCIAL HISTORY:  Social History   Socioeconomic History   Marital status: Widowed    Spouse name: Not on file   Number of children: Not on file   Years of education: Not on file   Highest education level: Not on file  Occupational History   Not on file  Tobacco Use   Smoking status: Every Day    Packs/day: 1.00    Years: 37.00    Pack years: 37.00    Types: Cigarettes   Smokeless tobacco: Never   Tobacco comments:    smokes 1/2 pack a day [facility] 03/08/21  Vaping Use   Vaping Use: Never used  Substance and Sexual Activity   Alcohol use: Yes    Alcohol/week: 56.0 standard drinks    Types: 35 Cans of beer, 21 Shots of liquor per week    Comment: 5-6 beers and 2-3 shots daily-04/19/21   Drug use: No   Sexual activity: Yes  Other Topics Concern   Not on file  Social History Narrative   Not on file   Social Determinants of Health   Financial Resource Strain: Not on file  Food Insecurity: Not on file  Transportation Needs: No Transportation Needs   Lack of Transportation (Medical): No   Lack of Transportation (Non-Medical): No  Physical Activity: Inactive   Days of Exercise per Week: 0 days   Minutes of Exercise per Session: 0 min  Stress: Not on file  Social Connections: Not on file  Intimate Partner Violence: Not At Risk   Fear of Current or Ex-Partner: No   Emotionally Abused: No   Physically Abused: No   Sexually Abused: No    FAMILY HISTORY:  Family History  Problem Relation Age of Onset   Hypertension Mother    Asthma Father    Diabetes Sister    Hypertension Sister    Hypertension Brother    Hypertension Sister    Lupus Daughter     CURRENT MEDICATIONS:  Current Outpatient Medications  Medication Sig Dispense Refill   amLODipine (NORVASC) 5 MG tablet Take 5 mg by mouth daily.     aspirin 81 MG tablet Take 81 mg by mouth daily.     CARBOPLATIN IV Inject into the vein every 21 ( twenty-one) days.      Fluticasone-Umeclidin-Vilant (TRELEGY ELLIPTA) 100-62.5-25 MCG/INH AEPB Inhale 1 puff into the lungs daily in the afternoon.     gabapentin (NEURONTIN) 300 MG capsule Take 1 capsule (300 mg total) by mouth 3 (three) times daily. 90 capsule 3   lidocaine-prilocaine (EMLA) cream Apply small amount to port a cath site and cover with plastic wrap 1 hour prior to infusion appointments 30 g 3   Multiple Vitamin (MULTIVITAMIN WITH MINERALS) TABS tablet Take 1 tablet by mouth daily.     naloxone (NARCAN) nasal spray 4 mg/0.1 mL Spray 0.1 mL (contents of one device) into one nostril upon signs of opioid overdose.  Call 911.  May repeat once if no response within 2-3 minutes. 2 each 0   NIVOLUMAB IV Inject 360 mg into the vein  every 21 ( twenty-one) days.     omeprazole (PRILOSEC) 40 MG capsule Take 40 mg by mouth daily.     PACLITAXEL IV Inject 200 mg/m2 into the vein every 21 ( twenty-one) days.     acetaminophen (TYLENOL) 500 MG tablet Take 2 tablets (1,000 mg total) by mouth every 6 (six) hours as needed. (Patient not taking: Reported on 05/11/2021) 30 tablet 0   Oxycodone HCl 10 MG TABS Take 1 tablet (10 mg total) by mouth every 4 (four) hours as needed. (Patient not taking: Reported on 05/11/2021) 180 tablet 0   PROVENTIL HFA 108 (90 Base) MCG/ACT inhaler INHALE 2 PUFFS BY MOUTH EVERY 6 HOURS AS NEEDED FOR COUGHING, WHEEZING, OR SHORTNESS OF BREATH (Patient not taking: Reported on 05/11/2021) 20.1 g 1   No current facility-administered medications for this visit.    ALLERGIES:  Allergies  Allergen Reactions   Other Nausea Only and Other (See Comments)    No spicy foods- Patient cannot tolerate- HEARTBURN    PHYSICAL EXAM:  Performance status (ECOG): 1 - Symptomatic but completely ambulatory  There were no vitals filed for this visit. Wt Readings from Last 3 Encounters:  05/11/21 154 lb 9.6 oz (70.1 kg)  05/05/21 156 lb (70.8 kg)  04/21/21 158 lb (71.7 kg)   Physical Exam Vitals reviewed.   Constitutional:      Appearance: Normal appearance.  Cardiovascular:     Rate and Rhythm: Normal rate and regular rhythm.     Pulses: Normal pulses.     Heart sounds: Normal heart sounds.  Pulmonary:     Effort: Pulmonary effort is normal.     Breath sounds: Normal breath sounds.  Neurological:     General: No focal deficit present.     Mental Status: He is alert and oriented to person, place, and time.  Psychiatric:        Mood and Affect: Mood normal.        Behavior: Behavior normal.     LABORATORY DATA:  I have reviewed the labs as listed.  CBC Latest Ref Rng & Units 05/11/2021 04/23/2021 04/22/2021  WBC 4.0 - 10.5 K/uL 11.0(H) 13.0(H) 14.1(H)  Hemoglobin 13.0 - 17.0 g/dL 12.9(L) 11.8(L) 12.9(L)  Hematocrit 39.0 - 52.0 % 39.7 35.8(L) 39.2  Platelets 150 - 400 K/uL 346 267 291   CMP Latest Ref Rng & Units 05/11/2021 04/23/2021 04/22/2021  Glucose 70 - 99 mg/dL 104(H) 127(H) 207(H)  BUN 6 - 20 mg/dL 5(L) 6 6  Creatinine 0.61 - 1.24 mg/dL 0.48(L) 0.63 0.74  Sodium 135 - 145 mmol/L 137 135 133(L)  Potassium 3.5 - 5.1 mmol/L 3.5 3.6 4.0  Chloride 98 - 111 mmol/L 103 99 100  CO2 22 - 32 mmol/L _0 Calcium 8.9 - 10.3 mg/dL 8.9 8.9 9.1  Total Protein 6.5 - 8.1 g/dL 7.9 6.5 -  Total Bilirubin 0.3 - 1.2 mg/dL 0.5 0.5 -  Alkaline Phos 38 - 126 U/L 94 65 -  AST 15 - 41 U/L 31 36 -  ALT 0 - 44 U/L 16 20 -    DIAGNOSTIC IMAGING:  I have independently reviewed the scans and discussed with the patient. DG Chest 2 View  Result Date: 05/05/2021 CLINICAL DATA:  LEFT lung mass post VATS on 04/21/2021 EXAM: CHEST - 2 VIEW COMPARISON:  04/23/2021 FINDINGS: RIGHT subclavian Port-A-Cath with tip projecting over SVC. Normal heart size, mediastinal contours, and pulmonary vascularity. Atherosclerotic calcification aorta. Volume loss LEFT hemithorax with elevation of  LEFT diaphragm and mild mediastinal shift to LEFT. Underlying emphysematous changes. No acute infiltrate or pneumothorax;  pneumothorax seen on previous exam resolved. Minimal bibasilar effusions. Osseous structures unremarkable. IMPRESSION: Postsurgical changes of the LEFT hemithorax. No acute abnormalities. Electronically Signed   By: Lavonia Dana M.D.   On: 05/05/2021 13:32   DG Chest 2 View  Result Date: 04/19/2021 CLINICAL DATA:  Cavitating mass in left upper lobe.  Preop. EXAM: CHEST - 2 VIEW COMPARISON:  03/15/2021 CT and 12/23/2020 plain film. FINDINGS: Right Port-A-Cath tip at superior caval/atrial junction. Remote right rib fractures. Midline trachea. Normal heart size. Atherosclerosis in the transverse aorta. No pleural effusion or pneumothorax. Decrease in left upper lobe cavitary mass compared to plain film of 12/23/2020. On the order of 3.7 cm versus 5.1 cm. Clear right lung. IMPRESSION: Improvement left upper lobe lung mass since prior plain film of 12/23/2020. No acute superimposed process. Aortic Atherosclerosis (ICD10-I70.0). Electronically Signed   By: Abigail Miyamoto M.D.   On: 04/19/2021 16:56   DG Chest 1V REPEAT Same Day  Result Date: 04/23/2021 CLINICAL DATA:  Chest tube removal EXAM: CHEST - 1 VIEW SAME DAY COMPARISON:  04/23/2021 6:16 a.m. FINDINGS: Right Port-A-Cath tip: SVC. The patient is rotated to the left on today's radiograph, reducing diagnostic sensitivity and specificity. Subcutaneous emphysema along the left chest. There is a miniscule left lateral pneumothorax, about 2/10 sent of hemithoracic volume. Atherosclerotic calcification of the aortic arch. Old bilateral rib deformities. The previous right basilar subsegmental atelectasis is mildly reduced. IMPRESSION: 1. Approximally 2% left apicolateral pneumothorax, s/the chest tube removal. 2. Stable subcutaneous emphysema on the left. 3.  Aortic Atherosclerosis (ICD10-I70.0). Electronically Signed   By: Van Clines M.D.   On: 04/23/2021 14:55   DG CHEST PORT 1 VIEW  Result Date: 04/23/2021 CLINICAL DATA:  Follow-up of lung surgery.   Chest soreness. EXAM: PORTABLE CHEST 1 VIEW COMPARISON:  One day prior FINDINGS: Right Port-A-Cath tip at superior caval/atrial junction. Tracheal deviation left secondary to volume loss in the left hemithorax with left hemidiaphragm elevation. Normal heart size. Atherosclerosis in the transverse aorta. Left chest tube is similar in position with tip in the apex left hemithorax. Subcutaneous emphysema about the left chest wall is slightly increased. No pneumothorax. Developing right base atelectasis. Remote right rib fracture. IMPRESSION: Left chest tube in place, without evidence of pneumothorax. There is increase in subcutaneous emphysema about the left chest wall. Developing right base atelectasis. Aortic Atherosclerosis (ICD10-I70.0). Electronically Signed   By: Abigail Miyamoto M.D.   On: 04/23/2021 08:54   DG Chest Port 1 View  Result Date: 04/22/2021 CLINICAL DATA:  Chest tube, status post lobectomy, sore chest EXAM: PORTABLE CHEST 1 VIEW COMPARISON:  04/21/2021 FINDINGS: Left chest tube remains in place. No pneumothorax. Subcutaneous air in the left chest wall slightly increased. Right Port-A-Cath is unchanged. Postoperative changes on the left. No confluent opacities or effusions. Heart is normal size. IMPRESSION: Postoperative changes on the left left chest tube. No pneumothorax. Slight increased subcutaneous emphysema in the left chest wall. Electronically Signed   By: Rolm Baptise M.D.   On: 04/22/2021 09:11   DG Chest Port 1 View  Result Date: 04/21/2021 CLINICAL DATA:  Postop film floor left VATS. check support apparatus EXAM: PORTABLE CHEST 1 VIEW.  Patient is rotated. COMPARISON:  Chest x-ray 04/19/2021, CT chest 03/15/2021 FINDINGS: Right chest wall Port-A-Cath with tip overlying the expected region of the superior cavoatrial junction. Status post left chest tube overlying the left hemithorax  with tip at the left apex. Surgical clips and pulmonary staples noted overlying the left hemithorax. The  heart and mediastinal contours are within normal limits. Expected right to left mediastinal shift and elevated left hemidiaphragm. Slight prominence of the right hilar vasculature. Trace left lower lobe atelectasis. No focal consolidation. No pulmonary edema. No pleural effusion. No significant pneumothorax identified. No acute osseous abnormality.  Old healed right rib fractures. Mild left chest wall subcutaneus soft tissue edema. IMPRESSION: Left chest tube status post left upper lobectomy. No significant pneumothorax identified. Electronically Signed   By: Iven Finn M.D.   On: 04/21/2021 20:36     ASSESSMENT:  1.  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 Halford Chessman for left upper lobe lung mass. - Is current active smoker, 1/2 pack/day for 35 years. - CT lung cancer screening scan on 10/16/2020 showed cavitary mass in the anterior left upper lobe measuring 4.4 x 3.0 x 3.5 cm with 2.5 cm solid nodular component inferomedially.  This abuts and involves the anterior pleura/chest wall.  Expansile lytic lesion in the right posterolateral ninth and 10th ribs (unchanged dating back to CT from 06/16/2016).  10 mm short axis AP window node suspicious for nodal metastasis. - No change in baseline cough.  No hemoptysis.  No chest pain. - Brain MRI was negative for metastatic disease. - PET scan on 12/14/2020 showed left upper lobe mass measuring 3.9 x 4 cm with SUV 17.1.  Some contiguous hypermetabolism along the left chest wall including anterolateral left third rib with SUV 5.4.  No gross osseous destruction.  AP window lymph node of 9 mm, mildly hypermetabolic, SUV 2.8. - On 8/34/1962 left upper lobe brushing and lavage consistent with squamous cell carcinoma. - Left upper lobe fine-needle aspiration was also consistent with squamous cell carcinoma.  Lymph node could not be reached. - Caris testing with limited tissue showed MMR proficient, BRAF V6 energy negative,  PD-L1 negative by 22 C3, 28-8, SP142.  (IHC/CISH results should be interpreted with caution given the potential for false negative results) - 3 cycles of carboplatin, paclitaxel and nivolumab from 01/13/2021 through 02/24/2021 based on Checkmate-[id number]. - Left upper lobectomy and lymph node resection on 04/21/2021. - Pathology consistent with invasive moderately differentiated squamous cell carcinoma, 5 cm, margins negative.  Visceral pleura not involved.  Metastatic squamous cell carcinoma in 1/2 hilar lymph nodes.  Levels 5, 11, 12, 10, 11 and 12 lymph nodes were negative.  No definitive tumor response identified.  No lymphovascular invasion.  ypT2b, ypN1.   2.  Social/family history: - Previously worked in [employer], currently on disability. - Current active smoker, 1/2 pack/day for 35 years. - Drinks 6 pack of beers along with 1 quart of liquor every day. - 1 brother had throat cancer and another brother had lung cancer.   PLAN:  1.  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  His energy levels are recovering. - We discussed her pathology report which showed residual 5 cm tumor, 1 out of 2 lymph nodes in the hilar region positive.  Several other lymph nodes were negative.  No high risk features were seen. - As he has received neoadjuvant chemotherapy, no indication for adjuvant chemotherapy. - I have recommended sending his tumor for Caris testing.  Initial Caris testing was limited panel due to limited amount of tissue. - If his PD-L1 is  more than 50%, he may be candidate for adjuvant Atezolizumab. - RTC 3 to 4 weeks for follow-up.   2.  Joint pains: - He reported joint pains involving ankles, knees, elbows and wrists since the start of chemotherapy. - He is taking oxycodone 10 mg every 4-6 hours as needed.  3.  Neuropathy: - Constant numbness in the fingertips and toes is stable. - Continue gabapentin 300 mg 3 times  daily.    Orders placed this encounter:  No orders of the defined types were placed in this encounter.    Derek Jack, MD [place] [phone]   I, Thana Ates, am acting as a scribe for Dr. Derek Jack.  I, Derek Jack MD, have reviewed the above documentation for accuracy and completeness, and I agree with the above.

## 2021-05-11 ENCOUNTER — Inpatient Hospital Stay (HOSPITAL_COMMUNITY): Payer: Medicare PPO

## 2021-05-11 ENCOUNTER — Other Ambulatory Visit: Payer: Self-pay

## 2021-05-11 ENCOUNTER — Inpatient Hospital Stay (HOSPITAL_COMMUNITY): Payer: Medicare PPO | Attending: Hematology | Admitting: Hematology

## 2021-05-11 DIAGNOSIS — C3412 Malignant neoplasm of upper lobe, left bronchus or lung: Secondary | ICD-10-CM

## 2021-05-11 DIAGNOSIS — Z9221 Personal history of antineoplastic chemotherapy: Secondary | ICD-10-CM | POA: Diagnosis not present

## 2021-05-11 DIAGNOSIS — C3492 Malignant neoplasm of unspecified part of left bronchus or lung: Secondary | ICD-10-CM | POA: Diagnosis not present

## 2021-05-11 DIAGNOSIS — Z79891 Long term (current) use of opiate analgesic: Secondary | ICD-10-CM | POA: Insufficient documentation

## 2021-05-11 DIAGNOSIS — Z801 Family history of malignant neoplasm of trachea, bronchus and lung: Secondary | ICD-10-CM | POA: Diagnosis not present

## 2021-05-11 DIAGNOSIS — Z808 Family history of malignant neoplasm of other organs or systems: Secondary | ICD-10-CM | POA: Insufficient documentation

## 2021-05-11 DIAGNOSIS — Z79899 Other long term (current) drug therapy: Secondary | ICD-10-CM | POA: Diagnosis not present

## 2021-05-11 DIAGNOSIS — F17210 Nicotine dependence, cigarettes, uncomplicated: Secondary | ICD-10-CM | POA: Insufficient documentation

## 2021-05-11 DIAGNOSIS — G629 Polyneuropathy, unspecified: Secondary | ICD-10-CM | POA: Insufficient documentation

## 2021-05-11 LAB — CBC WITH DIFFERENTIAL/PLATELET
Abs Immature Granulocytes: 0.05 10*3/uL (ref 0.00–0.07)
Basophils Absolute: 0.1 10*3/uL (ref 0.0–0.1)
Basophils Relative: 1 %
Eosinophils Absolute: 0.2 10*3/uL (ref 0.0–0.5)
Eosinophils Relative: 1 %
HCT: 39.7 % (ref 39.0–52.0)
Hemoglobin: 12.9 g/dL — ABNORMAL LOW (ref 13.0–17.0)
Immature Granulocytes: 1 %
Lymphocytes Relative: 38 %
Lymphs Abs: 4.2 10*3/uL — ABNORMAL HIGH (ref 0.7–4.0)
MCH: 31.6 pg (ref 26.0–34.0)
MCHC: 32.5 g/dL (ref 30.0–36.0)
MCV: 97.3 fL (ref 80.0–100.0)
Monocytes Absolute: 1 10*3/uL (ref 0.1–1.0)
Monocytes Relative: 9 %
Neutro Abs: 5.6 10*3/uL (ref 1.7–7.7)
Neutrophils Relative %: 50 %
Platelets: 346 10*3/uL (ref 150–400)
RBC: 4.08 MIL/uL — ABNORMAL LOW (ref 4.22–5.81)
RDW: 16.2 % — ABNORMAL HIGH (ref 11.5–15.5)
WBC: 11 10*3/uL — ABNORMAL HIGH (ref 4.0–10.5)
nRBC: 0 % (ref 0.0–0.2)

## 2021-05-11 LAB — COMPREHENSIVE METABOLIC PANEL
ALT: 16 U/L (ref 0–44)
AST: 31 U/L (ref 15–41)
Albumin: 3.7 g/dL (ref 3.5–5.0)
Alkaline Phosphatase: 94 U/L (ref 38–126)
Anion gap: 9 (ref 5–15)
BUN: 5 mg/dL — ABNORMAL LOW (ref 6–20)
CO2: 25 mmol/L (ref 22–32)
Calcium: 8.9 mg/dL (ref 8.9–10.3)
Chloride: 103 mmol/L (ref 98–111)
Creatinine, Ser: 0.48 mg/dL — ABNORMAL LOW (ref 0.61–1.24)
GFR, Estimated: >60 mL/min (ref >60)
Glucose, Bld: 104 mg/dL — ABNORMAL HIGH (ref 70–99)
Potassium: 3.5 mmol/L (ref 3.5–5.1)
Sodium: 137 mmol/L (ref 135–145)
Total Bilirubin: 0.5 mg/dL (ref 0.3–1.2)
Total Protein: 7.9 g/dL (ref 6.5–8.1)

## 2021-05-11 LAB — MAGNESIUM: Magnesium: 2.1 mg/dL (ref 1.7–2.4)

## 2021-05-11 LAB — TSH: TSH: 2.125 u[IU]/mL (ref 0.350–4.500)

## 2021-05-11 MED ORDER — SODIUM CHLORIDE 0.9% FLUSH
10.0000 mL | Freq: Once | INTRAVENOUS | Status: AC
  Administered 2021-05-11: 10 mL via INTRAVENOUS

## 2021-05-11 MED ORDER — HEPARIN SOD (PORK) LOCK FLUSH 100 UNIT/ML IV SOLN
500.0000 [IU] | Freq: Once | INTRAVENOUS | Status: AC
  Administered 2021-05-11: 500 [IU] via INTRAVENOUS

## 2021-05-11 NOTE — Patient Instructions (Addendum)
[place] [facility] Discharge Instructions  You were seen today by Dr. Delton Coombes. He went over your recent results. Dr. Delton Coombes will see you back in 1 month for labs and follow up.   Thank you for choosing [place] [facility] to provide your oncology and hematology care.  To afford each patient quality time with our provider, please arrive at least 15 minutes before your scheduled appointment time.   If you have a lab appointment with the [facility] please come in thru the Main Entrance and check in at the main information desk  You need to re-schedule your appointment should you arrive 10 or more minutes late.  We strive to give you quality time with our providers, and arriving late affects you and other patients whose appointments are after yours.  Also, if you no show three or more times for appointments you may be dismissed from the clinic at the providers discretion.     Again, thank you for choosing [facility].  Our hope is that these requests will decrease the amount of time that you wait before being seen by our physicians.       _____________________________________________________________  Should you have questions after your visit to [facility], please contact our office at [phone] between the hours of 8:00 a.m. and 4:30 p.m.  Voicemails left after 4:00 p.m. will not be returned until the following business day.  For prescription refill requests, have your pharmacy contact our office and allow 72 hours.    Cancer Center Support Programs:   > Cancer Support Group  2nd Tuesday of the month 1pm-2pm, Journey Room

## 2021-05-11 NOTE — Progress Notes (Signed)
Patients port flushed without difficulty.  Good blood return noted with no bruising or swelling noted at site.  Stable during access and blood draw.  Band aid applied.  VSS.  Patient returned to the waiting room for office visit.

## 2021-05-11 NOTE — Patient Instructions (Signed)
[facility]  Discharge Instructions: Thank you for choosing [facility] to provide your oncology and hematology care.  If you have a lab appointment with the Cancer Center, please come in thru the Main Entrance and check in at the main information desk.  Wear comfortable clothing and clothing appropriate for easy access to any Portacath or PICC line.   We strive to give you quality time with your provider. You may need to reschedule your appointment if you arrive late (15 or more minutes).  Arriving late affects you and other patients whose appointments are after yours.  Also, if you miss three or more appointments without notifying the office, you may be dismissed from the clinic at the provider's discretion.      For prescription refill requests, have your pharmacy contact our office and allow 72 hours for refills to be completed.    Today you received the following chemotherapy and/or immunotherapy agents PORT flush labs      To help prevent nausea and vomiting after your treatment, we encourage you to take your nausea medication as directed.  BELOW ARE SYMPTOMS THAT SHOULD BE REPORTED IMMEDIATELY: *FEVER GREATER THAN 100.4 F (38 C) OR HIGHER *CHILLS OR SWEATING *NAUSEA AND VOMITING THAT IS NOT CONTROLLED WITH YOUR NAUSEA MEDICATION *UNUSUAL SHORTNESS OF BREATH *UNUSUAL BRUISING OR BLEEDING *URINARY PROBLEMS (pain or burning when urinating, or frequent urination) *BOWEL PROBLEMS (unusual diarrhea, constipation, pain near the anus) TENDERNESS IN MOUTH AND THROAT WITH OR WITHOUT PRESENCE OF ULCERS (sore throat, sores in mouth, or a toothache) UNUSUAL RASH, SWELLING OR PAIN  UNUSUAL VAGINAL DISCHARGE OR ITCHING   Items with * indicate a potential emergency and should be followed up as soon as possible or go to the Emergency Department if any problems should occur.  Please show the CHEMOTHERAPY ALERT CARD or IMMUNOTHERAPY ALERT CARD at check-in to the Emergency  Department and triage nurse.  Should you have questions after your visit or need to cancel or reschedule your appointment, please contact [facility] [phone]  and follow the prompts.  Office hours are 8:00 a.m. to 4:30 p.m. Monday - Friday. Please note that voicemails left after 4:00 p.m. may not be returned until the following business day.  We are closed weekends and major holidays. You have access to a nurse at all times for urgent questions. Please call the main number to the clinic [phone] and follow the prompts.  For any non-urgent questions, you may also contact your provider using MyChart. We now offer e-Visits for anyone 18 and older to request care online for non-urgent symptoms. For details visit [email or web address].   Also download the MyChart app! Go to the app store, search "MyChart", open the app, select [facility], and log in with your MyChart username and password.  Due to Covid, a mask is required upon entering the hospital/clinic. If you do not have a mask, one will be given to you upon arrival. For doctor visits, patients may have 1 support person aged 18 or older with them. For treatment visits, patients cannot have anyone with them due to current Covid guidelines and our immunocompromised population.  

## 2021-05-13 DIAGNOSIS — Z23 Encounter for immunization: Secondary | ICD-10-CM | POA: Diagnosis not present

## 2021-05-19 ENCOUNTER — Encounter (HOSPITAL_COMMUNITY): Payer: Self-pay

## 2021-05-20 DIAGNOSIS — C771 Secondary and unspecified malignant neoplasm of intrathoracic lymph nodes: Secondary | ICD-10-CM | POA: Diagnosis not present

## 2021-05-20 DIAGNOSIS — C3412 Malignant neoplasm of upper lobe, left bronchus or lung: Secondary | ICD-10-CM | POA: Diagnosis not present

## 2021-05-24 DIAGNOSIS — F17210 Nicotine dependence, cigarettes, uncomplicated: Secondary | ICD-10-CM | POA: Diagnosis not present

## 2021-05-24 DIAGNOSIS — K851 Biliary acute pancreatitis without necrosis or infection: Secondary | ICD-10-CM | POA: Diagnosis not present

## 2021-05-25 ENCOUNTER — Other Ambulatory Visit (HOSPITAL_COMMUNITY): Payer: Self-pay | Admitting: Hematology

## 2021-05-25 DIAGNOSIS — C771 Secondary and unspecified malignant neoplasm of intrathoracic lymph nodes: Secondary | ICD-10-CM | POA: Diagnosis not present

## 2021-05-25 DIAGNOSIS — C3412 Malignant neoplasm of upper lobe, left bronchus or lung: Secondary | ICD-10-CM | POA: Diagnosis not present

## 2021-05-25 DIAGNOSIS — Z95828 Presence of other vascular implants and grafts: Secondary | ICD-10-CM

## 2021-05-25 DIAGNOSIS — C3492 Malignant neoplasm of unspecified part of left bronchus or lung: Secondary | ICD-10-CM

## 2021-05-25 NOTE — Telephone Encounter (Signed)
Refill request for Compazine.  Chart checked with nausea reported with visit 05/11/2021.  Next follow up visit 06/08/2021.

## 2021-06-02 ENCOUNTER — Other Ambulatory Visit (HOSPITAL_COMMUNITY): Payer: Self-pay

## 2021-06-02 ENCOUNTER — Encounter (HOSPITAL_COMMUNITY): Payer: Self-pay

## 2021-06-02 DIAGNOSIS — C3492 Malignant neoplasm of unspecified part of left bronchus or lung: Secondary | ICD-10-CM

## 2021-06-03 ENCOUNTER — Encounter (HOSPITAL_COMMUNITY): Payer: Self-pay | Admitting: Hematology

## 2021-06-03 MED ORDER — OXYCODONE HCL 10 MG PO TABS: 10.0000 mg | ORAL_TABLET | ORAL | 0 refills | Status: DC | PRN

## 2021-06-07 NOTE — Progress Notes (Signed)
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  PRIOR THERAPY: carboplatin, paclitaxel and nivolumab q21d x3 cycles  NGS Results: not done  CURRENT THERAPY: surveillance  BRIEF ONCOLOGIC HISTORY:  Oncology History  Primary lung squamous cell carcinoma, left ([place])  12/31/2020 Cancer Staging   Staging form: Lung, AJCC 8th Edition - Clinical stage from 12/31/2020: Stage IIIA (cT2b, cN2, cM0) - Signed by Derek Jack, MD on 01/01/2021 Stage prefix: Initial diagnosis    01/01/2021 Initial Diagnosis   Primary lung squamous cell carcinoma, left ([place])   01/13/2021 -  Chemotherapy    Patient is on Treatment Plan: LUNG NSCLC CARBOPLATIN / PACLITAXEL Q21D X 3 CYCLES         CANCER STAGING: Cancer Staging Primary lung squamous cell carcinoma, left ([place]) Staging form: Lung, AJCC 8th Edition - Clinical stage from 12/31/2020: Stage IIIA (cT2b, cN2, cM0) - Signed by Derek Jack, MD on 01/01/2021   INTERVAL HISTORY:  [name], a 60 y.o. 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 Aeon was last seen on 05/11/2021.   Today he reports feeling well. He reports improved fatigue and joint pains. He is taking oxycodone 4 times daily. He is currently smoking 1/2 ppd.   REVIEW OF SYSTEMS:  Review of Systems  Constitutional:  Negative for appetite change (50%) and fatigue (50%).  Respiratory:  Positive for cough and shortness of breath.   Gastrointestinal:  Positive for constipation and nausea.  Neurological:  Positive for numbness.  All other systems reviewed and are negative.  PAST MEDICAL/SURGICAL HISTORY:  Past Medical History:  Diagnosis Date   Cancer [facility])    Lung  cancer   COPD (chronic obstructive pulmonary disease) ([place]) 2007   EMPHYSEMA   Depression    Dyspnea    occasional - no oxygen   GERD (gastroesophageal reflux disease) 2010   Hepatitis    Alcoholic    HLD (hyperlipidemia)    no meds   Hypertension    Port-A-Cath in place 01/01/2021   Past Surgical History:  Procedure Laterality Date   BRONCHIAL BIOPSY  12/23/2020   Procedure: BRONCHIAL BIOPSIES;  Surgeon: [name], MD;  Location: [place];  Service: Pulmonary;;   BRONCHIAL BRUSHINGS  12/23/2020   Procedure: BRONCHIAL BRUSHINGS;  Surgeon: [name], MD;  Location: [place];  Service: Pulmonary;;   BRONCHIAL NEEDLE ASPIRATION BIOPSY  12/23/2020   Procedure: BRONCHIAL NEEDLE ASPIRATION BIOPSIES;  Surgeon: [name], MD;  Location: [place];  Service: Pulmonary;;   BRONCHIAL WASHINGS  12/23/2020   Procedure: BRONCHIAL WASHINGS;  Surgeon: [name], MD;  Location: [facility] ENDOSCOPY;  Service: Pulmonary;;   CARDIAC CATHETERIZATION  2010   COLONOSCOPY  03/14/2012   Procedure: COLONOSCOPY;  Surgeon: Rogene Houston, MD;  [facility];  Service: Endoscopy;  Laterality: N/A;  100   INTERCOSTAL NERVE BLOCK  04/21/2021   Procedure: INTERCOSTAL NERVE BLOCK;  Surgeon: Melrose Nakayama, MD;  Location: [place];  Service: Thoracic;;   IR IMAGING GUIDED PORT INSERTION  01/12/2021   VIDEO ASSISTED THORACOSCOPY (VATS)/ LOBECTOMY Left 04/21/2021   Procedure: VIDEO ASSISTED THORACOSCOPY (VATS)/ LOBECTOMY;  Surgeon: Melrose Nakayama, MD;  Location: [place];  Service: Thoracic;  Laterality: Left;   VIDEO BRONCHOSCOPY WITH ENDOBRONCHIAL NAVIGATION N/A 12/23/2020   Procedure: VIDEO BRONCHOSCOPY WITH ENDOBRONCHIAL NAVIGATION;  Surgeon: [name], MD;  Location: [place] ENDOSCOPY;  Service: Pulmonary;  Laterality: N/A;   WRIST GANGLION EXCISION  1985    SOCIAL HISTORY:  Social History   Socioeconomic History   Marital status: Widowed    Spouse name: Not on file    Number of children: Not on file   Years of education: Not on file   Highest education level: Not on file  Occupational History   Not on file  Tobacco Use   Smoking status: Every Day    Packs/day: 1.00    Years: 37.00    Pack years: 37.00    Types: Cigarettes   Smokeless tobacco: Never   Tobacco comments:    smokes 1/2 pack a day [facility] 03/08/21  Vaping Use   Vaping Use: Never used  Substance and Sexual Activity   Alcohol use: Yes    Alcohol/week: 56.0 standard drinks    Types: 35 Cans of beer, 21 Shots of liquor per week    Comment: 5-6 beers and 2-3 shots daily-04/19/21   Drug use: No   Sexual activity: Yes  Other Topics Concern   Not on file  Social History Narrative   Not on file   Social Determinants of Health   Financial Resource Strain: Not on file  Food Insecurity: Not on file  Transportation Needs: No Transportation Needs   Lack of Transportation (Medical): No   Lack of Transportation (Non-Medical): No  Physical Activity: Inactive   Days of Exercise per Week: 0 days   Minutes of Exercise per Session: 0 min  Stress: Not on file  Social Connections: Not on file  Intimate Partner Violence: Not At Risk   Fear of Current or Ex-Partner: No   Emotionally Abused: No   Physically Abused: No   Sexually Abused: No    FAMILY HISTORY:  Family History  Problem Relation Age of Onset   Hypertension Mother    Asthma Father    Diabetes Sister    Hypertension Sister    Hypertension Brother    Hypertension Sister    Lupus Daughter     CURRENT MEDICATIONS:  Current Outpatient Medications  Medication Sig Dispense Refill   acetaminophen (TYLENOL) 500 MG tablet Take 2 tablets (1,000 mg total) by mouth every 6 (six) hours as needed. (Patient not taking: Reported on 05/11/2021) 30 tablet 0   amLODipine (NORVASC) 5 MG tablet Take 5 mg by mouth daily.     aspirin 81 MG tablet Take 81 mg by mouth daily.     CARBOPLATIN IV Inject into the vein every 21 ( twenty-one) days.      Fluticasone-Umeclidin-Vilant (TRELEGY ELLIPTA) 100-62.5-25 MCG/INH AEPB Inhale 1 puff into the lungs daily in the afternoon.     gabapentin (NEURONTIN) 300 MG capsule Take 1 capsule (300 mg total) by mouth 3 (three) times daily. 90 capsule 3   lidocaine-prilocaine (EMLA) cream Apply small amount to port a cath site and cover with plastic wrap 1 hour prior to infusion appointments 30 g 3   Multiple Vitamin (MULTIVITAMIN WITH MINERALS) TABS tablet Take 1 tablet by mouth daily.     naloxone (NARCAN) nasal spray 4 mg/0.1 mL Spray 0.1 mL (contents of one device) into one nostril upon signs of opioid overdose.  Call 911.  May repeat once if no response within 2-3 minutes. 2 each 0   NIVOLUMAB IV Inject 360  mg into the vein every 21 ( twenty-one) days.     omeprazole (PRILOSEC) 40 MG capsule Take 40 mg by mouth daily.     Oxycodone HCl 10 MG TABS Take 1 tablet (10 mg total) by mouth every 4 (four) hours as needed. 180 tablet 0   PACLITAXEL IV Inject 200 mg/m2 into the vein every 21 ( twenty-one) days.     prochlorperazine (COMPAZINE) 10 MG tablet TAKE 1 TABLET BY MOUTH EVERY 6 HOURS AS NEEDED FOR NAUSEA OR VOMITING 30 tablet 0   PROVENTIL HFA 108 (90 Base) MCG/ACT inhaler INHALE 2 PUFFS BY MOUTH EVERY 6 HOURS AS NEEDED FOR COUGHING, WHEEZING, OR SHORTNESS OF BREATH (Patient not taking: Reported on 05/11/2021) 20.1 g 1   No current facility-administered medications for this visit.    ALLERGIES:  Allergies  Allergen Reactions   Other Nausea Only and Other (See Comments)    No spicy foods- Patient cannot tolerate- HEARTBURN    PHYSICAL EXAM:  Performance status (ECOG): 1 - Symptomatic but completely ambulatory  There were no vitals filed for this visit. Wt Readings from Last 3 Encounters:  05/11/21 154 lb 9.6 oz (70.1 kg)  05/05/21 156 lb (70.8 kg)  04/21/21 158 lb (71.7 kg)   Physical Exam Vitals reviewed.  Constitutional:      Appearance: Normal appearance.  Cardiovascular:     Rate and  Rhythm: Normal rate and regular rhythm.     Pulses: Normal pulses.     Heart sounds: Normal heart sounds.  Pulmonary:     Effort: Pulmonary effort is normal.     Breath sounds: Normal breath sounds.  Neurological:     General: No focal deficit present.     Mental Status: He is alert and oriented to person, place, and time.  Psychiatric:        Mood and Affect: Mood normal.        Behavior: Behavior normal.     LABORATORY DATA:  I have reviewed the labs as listed.  CBC Latest Ref Rng & Units 05/11/2021 04/23/2021 04/22/2021  WBC 4.0 - 10.5 K/uL 11.0(H) 13.0(H) 14.1(H)  Hemoglobin 13.0 - 17.0 g/dL 12.9(L) 11.8(L) 12.9(L)  Hematocrit 39.0 - 52.0 % 39.7 35.8(L) 39.2  Platelets 150 - 400 K/uL 346 267 291   CMP Latest Ref Rng & Units 05/11/2021 04/23/2021 04/22/2021  Glucose 70 - 99 mg/dL 104(H) 127(H) 207(H)  BUN 6 - 20 mg/dL 5(L) 6 6  Creatinine 0.61 - 1.24 mg/dL 0.48(L) 0.63 0.74  Sodium 135 - 145 mmol/L 137 135 133(L)  Potassium 3.5 - 5.1 mmol/L 3.5 3.6 4.0  Chloride 98 - 111 mmol/L 103 99 100  CO2 22 - 32 mmol/L '25 30 23  ' Calcium 8.9 - 10.3 mg/dL 8.9 8.9 9.1  Total Protein 6.5 - 8.1 g/dL 7.9 6.5 -  Total Bilirubin 0.3 - 1.2 mg/dL 0.5 0.5 -  Alkaline Phos 38 - 126 U/L 94 65 -  AST 15 - 41 U/L 31 36 -  ALT 0 - 44 U/L 16 20 -    DIAGNOSTIC IMAGING:  I have independently reviewed the scans and discussed with the patient. No results found.   ASSESSMENT:  1.  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 Halford Chessman for left upper lobe lung mass. - Is current active smoker, 1/2 pack/day for 35 years. - CT lung cancer screening scan on 10/16/2020 showed cavitary mass in the anterior left upper lobe measuring  4.4 x 3.0 x 3.5 cm with 2.5 cm solid nodular component inferomedially.  This abuts and involves the anterior pleura/chest wall.  Expansile lytic lesion in the right posterolateral ninth and 10th ribs (unchanged dating back to CT from 06/16/2016).   10 mm short axis AP window node suspicious for nodal metastasis. - No change in baseline cough.  No hemoptysis.  No chest pain. - Brain MRI was negative for metastatic disease. - PET scan on 12/14/2020 showed left upper lobe mass measuring 3.9 x 4 cm with SUV 17.1.  Some contiguous hypermetabolism along the left chest wall including anterolateral left third rib with SUV 5.4.  No gross osseous destruction.  AP window lymph node of 9 mm, mildly hypermetabolic, SUV 2.8. - On 7/41/6384 left upper lobe brushing and lavage consistent with squamous cell carcinoma. - Left upper lobe fine-needle aspiration was also consistent with squamous cell carcinoma.  Lymph node could not be reached. - Caris testing with limited tissue showed MMR proficient, BRAF V6 energy negative, PD-L1 negative by 22 C3, 28-8, SP142.  (IHC/CISH results should be interpreted with caution given the potential for false negative results) - 3 cycles of carboplatin, paclitaxel and nivolumab from 01/13/2021 through 02/24/2021 based on [id number]. - Left upper lobectomy and lymph node resection on 04/21/2021. - Pathology consistent with invasive moderately differentiated squamous cell carcinoma, 5 cm, margins negative.  Visceral pleura not involved.  Metastatic squamous cell carcinoma in 1/2 hilar lymph nodes.  Levels 5, 11, 12, 10, 11 and 12 lymph nodes were negative.  No definitive tumor response identified.  No lymphovascular invasion.  ypT2b, ypN1. - Caris testing on resection sample from 04/21/2021 shows PD-L1 TPS 1%.  No targetable mutations.  MSI stable.  TMB low.   2.  Social/family history: - Previously worked in [employer], currently on disability. - Current active smoker, 1/2 pack/day for 35 years. - Drinks 6 pack of beers along with 1 quart of liquor every day. - 1 brother had throat cancer and another brother had lung cancer.   PLAN:  1.  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 - We discussed molecular testing results.  No targetable mutations.  PD-L1 is 1%. - I did not recommend any adjuvant immunotherapy at this time. - We will continue close monitoring.  I plan to see him back in 6 weeks with repeat CT scan of the chest.  We will monitor doing imaging every 3 to 6 months.   2.  Joint pains: - He has joint pains involving multiple joints in the mornings. - He is taking oxycodone 4 times daily. - I have encouraged him to slowly taper it off initially by taking it 3 times daily.  3.  Neuropathy: - Constant numbness in the fingertips and toes is stable. - Continue gabapentin 300 mg 3 times daily.   Orders placed this encounter:  No orders of the defined types were placed in this encounter.    Derek Jack, MD [facility] [phone]   I, Thana Ates, am acting as a scribe for Dr. Derek Jack.  I, Derek Jack MD, have reviewed the above documentation for accuracy and completeness, and I agree with the above.

## 2021-06-08 ENCOUNTER — Other Ambulatory Visit: Payer: Self-pay

## 2021-06-08 ENCOUNTER — Inpatient Hospital Stay (HOSPITAL_COMMUNITY): Payer: Medicare PPO | Attending: Hematology | Admitting: Hematology

## 2021-06-08 ENCOUNTER — Inpatient Hospital Stay (HOSPITAL_COMMUNITY): Payer: Medicare PPO

## 2021-06-08 VITALS — BP 109/77 | HR 107 | Temp 96.8°F | Resp 18 | Wt 154.4 lb

## 2021-06-08 DIAGNOSIS — Z801 Family history of malignant neoplasm of trachea, bronchus and lung: Secondary | ICD-10-CM | POA: Insufficient documentation

## 2021-06-08 DIAGNOSIS — C3492 Malignant neoplasm of unspecified part of left bronchus or lung: Secondary | ICD-10-CM | POA: Diagnosis not present

## 2021-06-08 DIAGNOSIS — Z808 Family history of malignant neoplasm of other organs or systems: Secondary | ICD-10-CM | POA: Diagnosis not present

## 2021-06-08 DIAGNOSIS — C3412 Malignant neoplasm of upper lobe, left bronchus or lung: Secondary | ICD-10-CM | POA: Diagnosis not present

## 2021-06-08 DIAGNOSIS — G629 Polyneuropathy, unspecified: Secondary | ICD-10-CM | POA: Diagnosis not present

## 2021-06-08 DIAGNOSIS — F17210 Nicotine dependence, cigarettes, uncomplicated: Secondary | ICD-10-CM | POA: Diagnosis not present

## 2021-06-08 DIAGNOSIS — I10 Essential (primary) hypertension: Secondary | ICD-10-CM | POA: Insufficient documentation

## 2021-06-08 DIAGNOSIS — M2550 Pain in unspecified joint: Secondary | ICD-10-CM | POA: Insufficient documentation

## 2021-06-08 LAB — CBC WITH DIFFERENTIAL/PLATELET
Abs Immature Granulocytes: 0.02 10*3/uL (ref 0.00–0.07)
Basophils Absolute: 0.1 10*3/uL (ref 0.0–0.1)
Basophils Relative: 1 %
Eosinophils Absolute: 0.1 10*3/uL (ref 0.0–0.5)
Eosinophils Relative: 1 %
HCT: 41.3 % (ref 39.0–52.0)
Hemoglobin: 13.5 g/dL (ref 13.0–17.0)
Immature Granulocytes: 0 %
Lymphocytes Relative: 41 %
Lymphs Abs: 3.6 10*3/uL (ref 0.7–4.0)
MCH: 31.5 pg (ref 26.0–34.0)
MCHC: 32.7 g/dL (ref 30.0–36.0)
MCV: 96.3 fL (ref 80.0–100.0)
Monocytes Absolute: 0.7 10*3/uL (ref 0.1–1.0)
Monocytes Relative: 8 %
Neutro Abs: 4.3 10*3/uL (ref 1.7–7.7)
Neutrophils Relative %: 49 %
Platelets: 319 10*3/uL (ref 150–400)
RBC: 4.29 MIL/uL (ref 4.22–5.81)
RDW: 14.5 % (ref 11.5–15.5)
WBC: 8.8 10*3/uL (ref 4.0–10.5)
nRBC: 0 % (ref 0.0–0.2)

## 2021-06-08 LAB — MAGNESIUM: Magnesium: 2.2 mg/dL (ref 1.7–2.4)

## 2021-06-08 LAB — COMPREHENSIVE METABOLIC PANEL
ALT: 23 U/L (ref 0–44)
AST: 53 U/L — ABNORMAL HIGH (ref 15–41)
Albumin: 3.7 g/dL (ref 3.5–5.0)
Alkaline Phosphatase: 94 U/L (ref 38–126)
Anion gap: 9 (ref 5–15)
BUN: 5 mg/dL — ABNORMAL LOW (ref 6–20)
CO2: 25 mmol/L (ref 22–32)
Calcium: 8.8 mg/dL — ABNORMAL LOW (ref 8.9–10.3)
Chloride: 102 mmol/L (ref 98–111)
Creatinine, Ser: 0.51 mg/dL — ABNORMAL LOW (ref 0.61–1.24)
GFR, Estimated: >60 mL/min (ref >60)
Glucose, Bld: 115 mg/dL — ABNORMAL HIGH (ref 70–99)
Potassium: 3.5 mmol/L (ref 3.5–5.1)
Sodium: 136 mmol/L (ref 135–145)
Total Bilirubin: 0.4 mg/dL (ref 0.3–1.2)
Total Protein: 8 g/dL (ref 6.5–8.1)

## 2021-06-08 MED ORDER — HEPARIN SOD (PORK) LOCK FLUSH 100 UNIT/ML IV SOLN
500.0000 [IU] | Freq: Once | INTRAVENOUS | Status: AC
  Administered 2021-06-08: 500 [IU] via INTRAVENOUS

## 2021-06-08 MED ORDER — SODIUM CHLORIDE 0.9% FLUSH
10.0000 mL | Freq: Once | INTRAVENOUS | Status: AC
  Administered 2021-06-08: 10 mL via INTRAVENOUS

## 2021-06-08 NOTE — Progress Notes (Signed)
Patients port flushed without difficulty.  Good blood return noted with no bruising or swelling noted at site.  Patient stable during access and blood draw.  Band aid applied.  VSS with discharge and left in satisfactory condition with no s/s of distress noted.

## 2021-06-08 NOTE — Patient Instructions (Signed)
[facility]  Discharge Instructions: Thank you for choosing [facility] to provide your oncology and hematology care.  If you have a lab appointment with the [facility], please come in thru the Main Entrance and check in at the main information desk.  Wear comfortable clothing and clothing appropriate for easy access to any Portacath or PICC line.   We strive to give you quality time with your provider. You may need to reschedule your appointment if you arrive late (15 or more minutes).  Arriving late affects you and other patients whose appointments are after yours.  Also, if you miss three or more appointments without notifying the office, you may be dismissed from the clinic at the provider's discretion.      For prescription refill requests, have your pharmacy contact our office and allow 72 hours for refills to be completed.    Today you received the following chemotherapy and/or immunotherapy agents Port flush with labs      To help prevent nausea and vomiting after your treatment, we encourage you to take your nausea medication as directed.  BELOW ARE SYMPTOMS THAT SHOULD BE REPORTED IMMEDIATELY: *FEVER GREATER THAN 100.4 F (38 C) OR HIGHER *CHILLS OR SWEATING *NAUSEA AND VOMITING THAT IS NOT CONTROLLED WITH YOUR NAUSEA MEDICATION *UNUSUAL SHORTNESS OF BREATH *UNUSUAL BRUISING OR BLEEDING *URINARY PROBLEMS (pain or burning when urinating, or frequent urination) *BOWEL PROBLEMS (unusual diarrhea, constipation, pain near the anus) TENDERNESS IN MOUTH AND THROAT WITH OR WITHOUT PRESENCE OF ULCERS (sore throat, sores in mouth, or a toothache) UNUSUAL RASH, SWELLING OR PAIN  UNUSUAL VAGINAL DISCHARGE OR ITCHING   Items with * indicate a potential emergency and should be followed up as soon as possible or go to the Emergency Department if any problems should occur.  Please show the CHEMOTHERAPY ALERT CARD or IMMUNOTHERAPY ALERT CARD at check-in to the  Emergency Department and triage nurse.  Should you have questions after your visit or need to cancel or reschedule your appointment, please contact [facility] [phone]  and follow the prompts.  Office hours are 8:00 a.m. to 4:30 p.m. Monday - Friday. Please note that voicemails left after 4:00 p.m. may not be returned until the following business day.  We are closed weekends and major holidays. You have access to a nurse at all times for urgent questions. Please call the main number to the clinic [phone] and follow the prompts.  For any non-urgent questions, you may also contact your provider using MyChart. We now offer e-Visits for anyone 59 and older to request care online for non-urgent symptoms. For details visit [email or web address].   Also download the MyChart app! Go to the app store, search "MyChart", open the app, select [facility], and log in with your MyChart username and password.  Due to Covid, a mask is required upon entering the hospital/clinic. If you do not have a mask, one will be given to you upon arrival. For doctor visits, patients may have 1 support person aged 59 or older with them. For treatment visits, patients cannot have anyone with them due to current Covid guidelines and our immunocompromised population.

## 2021-06-08 NOTE — Patient Instructions (Addendum)
[place] at [facility] Discharge Instructions  You were seen and examined today by Dr. Delton Coombes. He reviewed your most recent labs and everything looks okay. Dr. Delton Coombes reviewed your PDL-1 results and he does not feel that you will benefit much from targeted treatments like immunotherapy. He recommends watching you carefully with scans every three months. Your next scan will be mid December.  Please keep follow up as scheduled.   Thank you for choosing [facility] at [facility] to provide your oncology and hematology care.  To afford each patient quality time with our provider, please arrive at least 15 minutes before your scheduled appointment time.   If you have a lab appointment with the [facility] please come in thru the Main Entrance and check in at the main information desk.  You need to re-schedule your appointment should you arrive 10 or more minutes late.  We strive to give you quality time with our providers, and arriving late affects you and other patients whose appointments are after yours.  Also, if you no show three or more times for appointments you may be dismissed from the clinic at the providers discretion.     Again, thank you for choosing [facility].  Our hope is that these requests will decrease the amount of time that you wait before being seen by our physicians.       _____________________________________________________________  Should you have questions after your visit to [facility], please contact our office at [phone] and follow the prompts.  Our office hours are 8:00 a.m. and 4:30 p.m. Monday - Friday.  Please note that voicemails left after 4:00 p.m. may not be returned until the following business day.  We are closed weekends and major holidays.  You do have access to a nurse 24-7, just call the main number to the clinic [phone] and do not press any options, hold on the line  and a nurse will answer the phone.    For prescription refill requests, have your pharmacy contact our office and allow 72 hours.    Due to Covid, you will need to wear a mask upon entering the hospital. If you do not have a mask, a mask will be given to you at the Main Entrance upon arrival. For doctor visits, patients may have 1 support person age 49 or older with them. For treatment visits, patients can not have anyone with them due to social distancing guidelines and our immunocompromised population.

## 2021-06-24 DIAGNOSIS — C3490 Malignant neoplasm of unspecified part of unspecified bronchus or lung: Secondary | ICD-10-CM | POA: Diagnosis not present

## 2021-06-24 DIAGNOSIS — I10 Essential (primary) hypertension: Secondary | ICD-10-CM | POA: Diagnosis not present

## 2021-07-05 ENCOUNTER — Encounter (HOSPITAL_COMMUNITY): Payer: Self-pay

## 2021-07-05 ENCOUNTER — Other Ambulatory Visit (HOSPITAL_COMMUNITY): Payer: Self-pay | Admitting: *Deleted

## 2021-07-05 DIAGNOSIS — C3492 Malignant neoplasm of unspecified part of left bronchus or lung: Secondary | ICD-10-CM

## 2021-07-05 MED ORDER — GABAPENTIN 300 MG PO CAPS: 300.0000 mg | ORAL_CAPSULE | Freq: Three times a day (TID) | ORAL | 3 refills | Status: DC

## 2021-07-05 MED ORDER — OXYCODONE HCL 10 MG PO TABS: 10.0000 mg | ORAL_TABLET | Freq: Three times a day (TID) | ORAL | 0 refills | Status: DC | PRN

## 2021-07-08 DIAGNOSIS — J449 Chronic obstructive pulmonary disease, unspecified: Secondary | ICD-10-CM | POA: Diagnosis not present

## 2021-07-08 HISTORY — PX: PORTACATH PLACEMENT: SHX2246

## 2021-07-19 ENCOUNTER — Encounter (HOSPITAL_COMMUNITY): Payer: Self-pay | Admitting: Hematology

## 2021-07-20 DIAGNOSIS — R000 Tachycardia, unspecified: Secondary | ICD-10-CM | POA: Diagnosis not present

## 2021-07-20 DIAGNOSIS — I10 Essential (primary) hypertension: Secondary | ICD-10-CM | POA: Diagnosis not present

## 2021-07-20 DIAGNOSIS — K219 Gastro-esophageal reflux disease without esophagitis: Secondary | ICD-10-CM | POA: Diagnosis not present

## 2021-07-20 DIAGNOSIS — J449 Chronic obstructive pulmonary disease, unspecified: Secondary | ICD-10-CM | POA: Diagnosis not present

## 2021-07-26 ENCOUNTER — Ambulatory Visit (HOSPITAL_COMMUNITY): Admission: RE | Admit: 2021-07-26 | Payer: Medicare HMO | Source: Ambulatory Visit

## 2021-07-28 ENCOUNTER — Ambulatory Visit (HOSPITAL_COMMUNITY): Payer: Medicare PPO | Admitting: Hematology

## 2021-07-29 ENCOUNTER — Other Ambulatory Visit: Payer: Self-pay

## 2021-07-29 ENCOUNTER — Ambulatory Visit (HOSPITAL_COMMUNITY)
Admission: RE | Admit: 2021-07-29 | Discharge: 2021-07-29 | Disposition: A | Discharge status: Home / Self Care | Payer: Medicare HMO | Source: Ambulatory Visit | Attending: Hematology | Admitting: Hematology

## 2021-07-29 DIAGNOSIS — C3412 Malignant neoplasm of upper lobe, left bronchus or lung: Secondary | ICD-10-CM | POA: Insufficient documentation

## 2021-07-29 DIAGNOSIS — C3490 Malignant neoplasm of unspecified part of unspecified bronchus or lung: Secondary | ICD-10-CM | POA: Diagnosis not present

## 2021-07-29 DIAGNOSIS — I700 Atherosclerosis of aorta: Secondary | ICD-10-CM | POA: Diagnosis not present

## 2021-07-29 DIAGNOSIS — C3492 Malignant neoplasm of unspecified part of left bronchus or lung: Secondary | ICD-10-CM | POA: Insufficient documentation

## 2021-07-29 DIAGNOSIS — J439 Emphysema, unspecified: Secondary | ICD-10-CM | POA: Diagnosis not present

## 2021-07-29 LAB — POCT I-STAT CREATININE: Creatinine, Ser: 0.7 mg/dL (ref 0.61–1.24)

## 2021-07-29 IMAGING — CT CT CHEST W/ CM
2 of 3 series · 15 of 36 positions shown, 18 images · IV contrast (Omnipaque or Isovue)
Comparison: [DATE]

CLINICAL DATA: Left upper lobe lung cancer restaging, status post
left upper lobectomy

EXAM:
CT CHEST WITH CONTRAST
TECHNIQUE: Multidetector CT imaging of the chest was performed during
intravenous contrast administration.
CONTRAST:  75mL OMNIPAQUE IOHEXOL 300 MG/ML  SOLN

[Series 2: routine chest with · axial · 0.75mm/px · z∈[+1248,+1524]mm · 12 of 164 slices shown, 15 images]
[im 13/164  mediastinal]
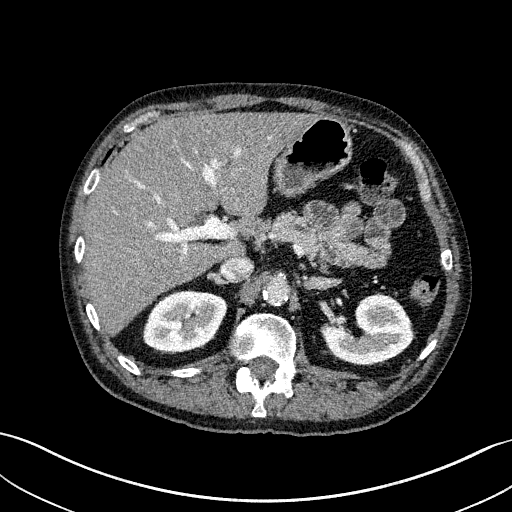
[im 13/164  lung]
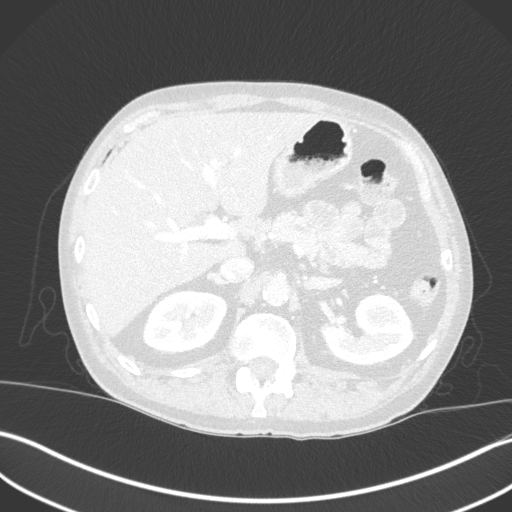
[im 25/164  lung]
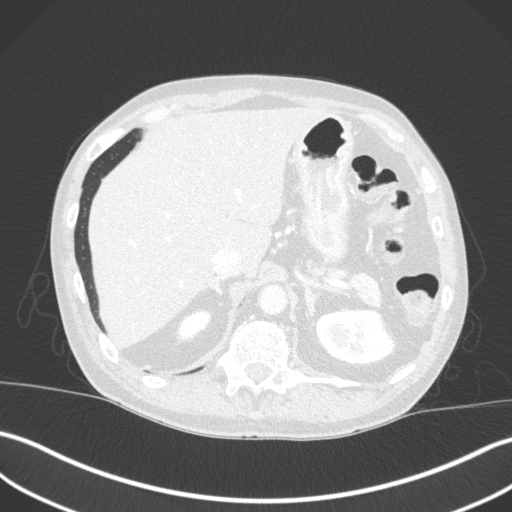
[im 37/164  lung]
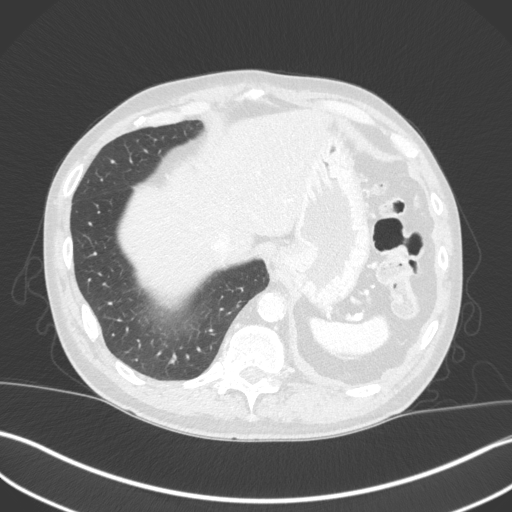
[im 49/164  lung]
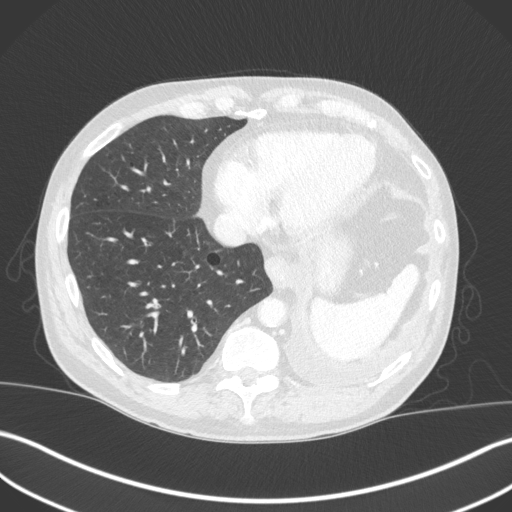
[im 61/164  mediastinal]
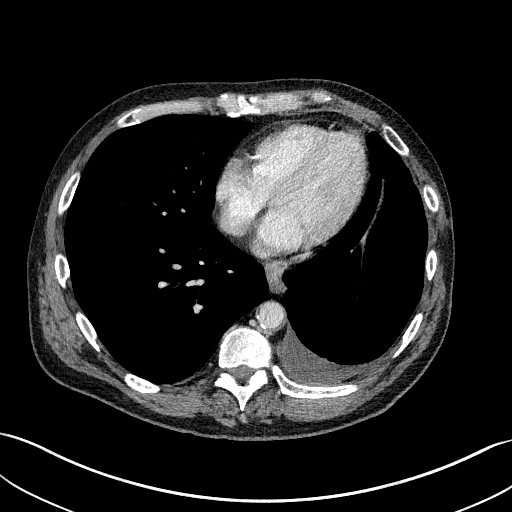
[im 61/164  lung]
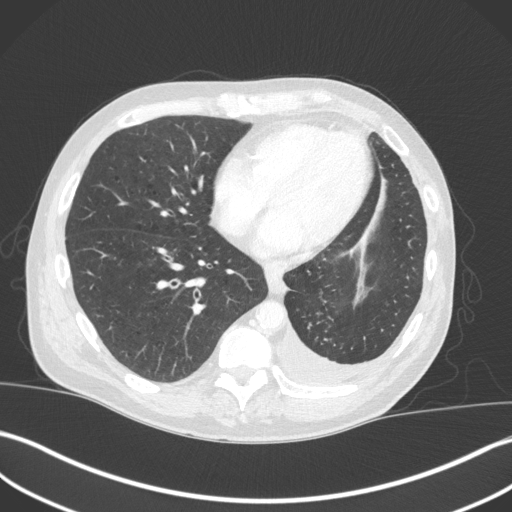
[im 73/164  lung]
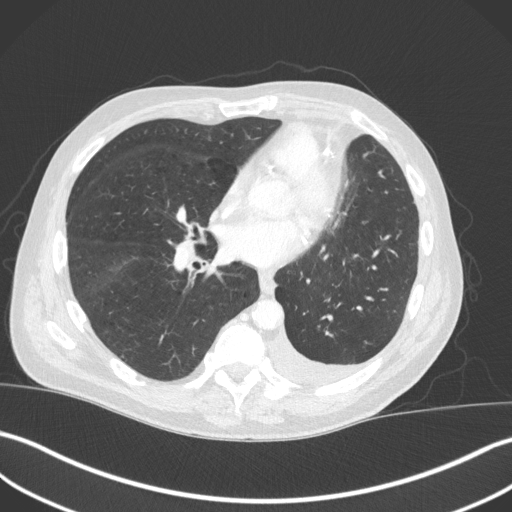
[im 91/164  lung]
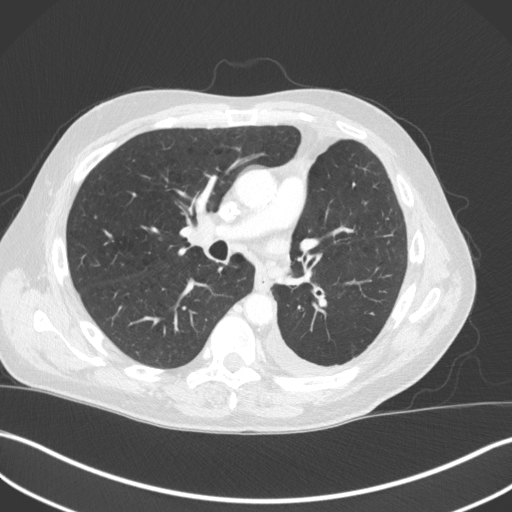
[im 103/164  lung]
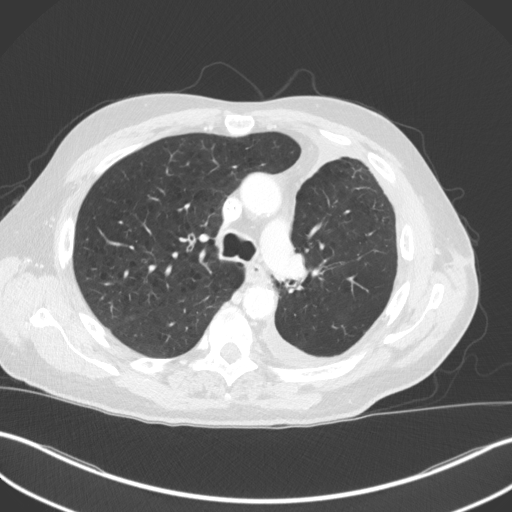
[im 115/164  mediastinal]
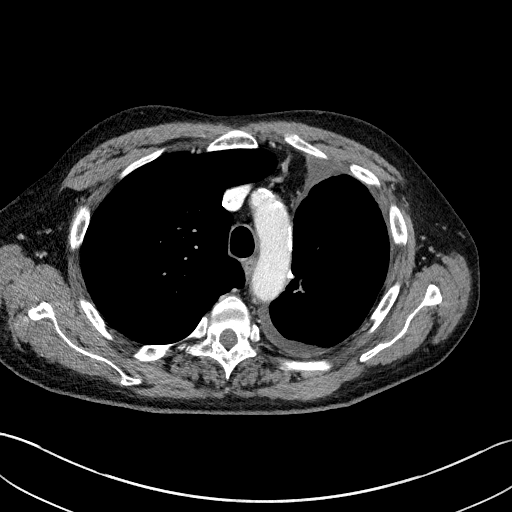
[im 115/164  lung]
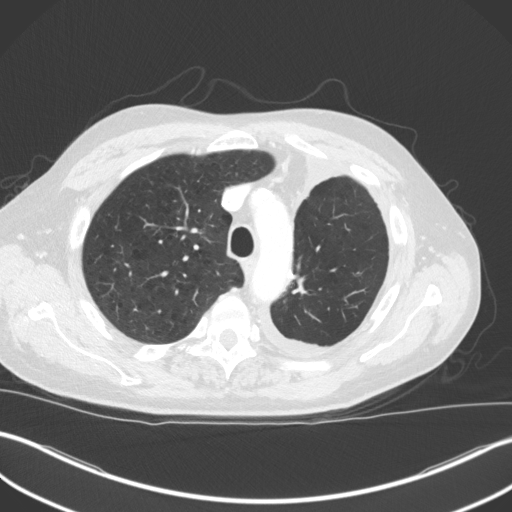
[im 127/164  lung]
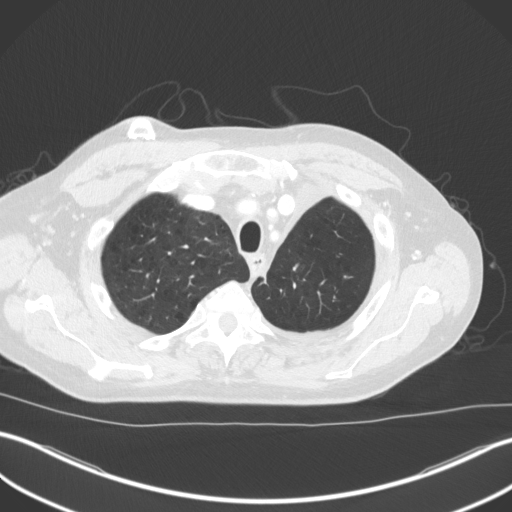
[im 139/164  lung]
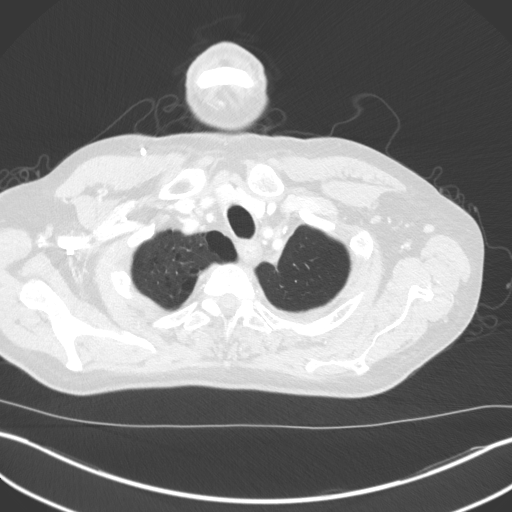
[im 151/164  lung]
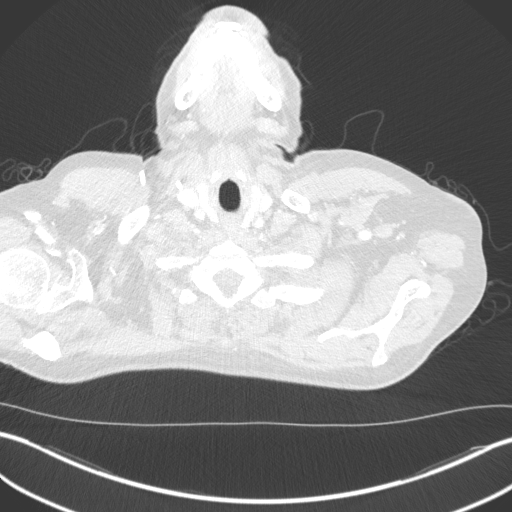

[Series 5: coronal · coronal · 0.68mm/px · 3 of 150 slices shown]
[im 30/150  lung]
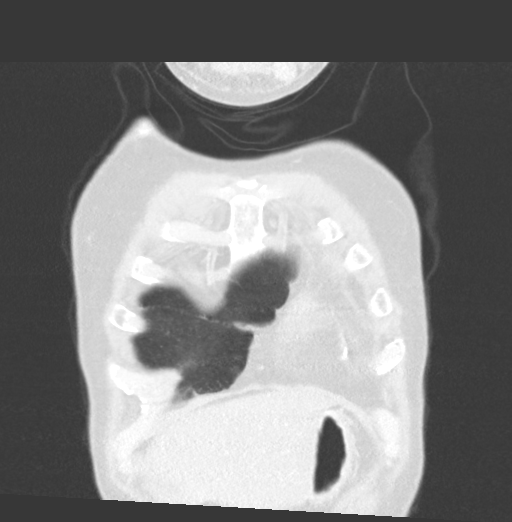
[im 60/150  lung]
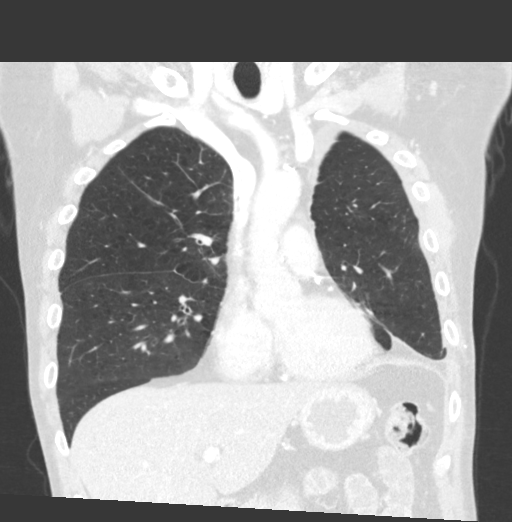
[im 90/150  lung]
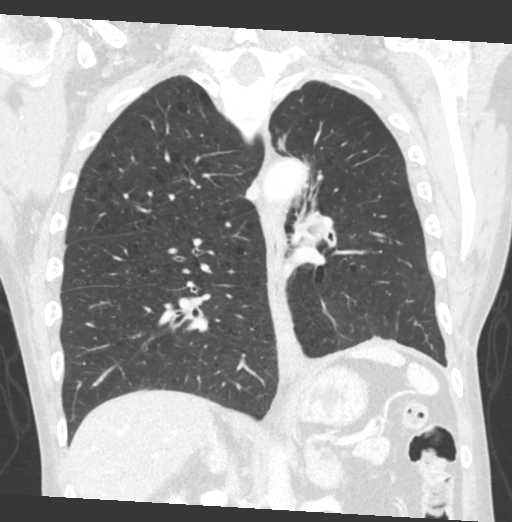

[15 of 36 positions shown; findings below may reference images not displayed]

FINDINGS: Cardiovascular: Right chest port catheter. Aortic atherosclerosis.
Normal heart size. Three-vessel coronary artery calcifications. No
pericardial effusion.

Mediastinum/Nodes: No enlarged mediastinal, hilar, or axillary lymph
nodes. Thyroid gland, trachea, and esophagus demonstrate no
significant findings.

Lungs/Pleura: Status post interval left upper lobectomy with a
small, likely loculated left pleural effusion. Moderate underlying
centrilobular and paraseptal emphysema and diffuse bilateral
bronchial wall thickening. No pleural effusion or pneumothorax.

Upper Abdomen: No acute abnormality.  Hepatic steatosis.

Musculoskeletal: No chest wall mass or suspicious bone lesions
identified.
IMPRESSION: 1. Status post interval left upper lobectomy with a small, likely
loculated left pleural effusion.
2. No evidence of recurrent or metastatic disease in the chest.
3. Emphysema.
4. Coronary artery disease.
5. Hepatic steatosis.

Aortic Atherosclerosis ([TR]-[TR]) and Emphysema ([TR]-[TR]).

## 2021-07-29 MED ORDER — IOHEXOL 300 MG/ML  SOLN
75.0000 mL | Freq: Once | INTRAMUSCULAR | Status: AC | PRN
  Administered 2021-07-29: 12:00:00 75 mL via INTRAVENOUS

## 2021-08-03 ENCOUNTER — Other Ambulatory Visit (HOSPITAL_COMMUNITY): Payer: Self-pay | Admitting: Hematology

## 2021-08-03 DIAGNOSIS — C3492 Malignant neoplasm of unspecified part of left bronchus or lung: Secondary | ICD-10-CM

## 2021-08-03 MED ORDER — OXYCODONE HCL 10 MG PO TABS
10.0000 mg | ORAL_TABLET | Freq: Three times a day (TID) | ORAL | 0 refills | Status: DC | PRN
Start: 2021-08-03 — End: 2021-09-06

## 2021-08-04 ENCOUNTER — Other Ambulatory Visit (HOSPITAL_COMMUNITY): Payer: Self-pay | Admitting: *Deleted

## 2021-08-04 NOTE — Progress Notes (Signed)
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  PRIOR THERAPY: carboplatin, paclitaxel and nivolumab q21d x3 cycles  NGS Results: not done  CURRENT THERAPY: surveillance  BRIEF ONCOLOGIC HISTORY:  Oncology History  Primary lung squamous cell carcinoma, left ([place])  12/31/2020 Cancer Staging   Staging form: Lung, AJCC 8th Edition - Clinical stage from 12/31/2020: Stage IIIA (cT2b, cN2, cM0) - Signed by Derek Jack, MD on 01/01/2021 Stage prefix: Initial diagnosis    01/01/2021 Initial Diagnosis   Primary lung squamous cell carcinoma, left ([place])   01/13/2021 -  Chemotherapy    Patient is on Treatment Plan: LUNG NSCLC CARBOPLATIN / PACLITAXEL Q21D X 3 CYCLES         CANCER STAGING:  Cancer Staging  Primary lung squamous cell carcinoma, left ([place]) Staging form: Lung, AJCC 8th Edition - Clinical stage from 12/31/2020: Stage IIIA (cT2b, cN2, cM0) - Signed by Derek Jack, MD on 01/01/2021   INTERVAL HISTORY:  [name], a 60 y.o. 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 Bryan Fleming was last seen on 06/08/2021.   Today he reports feeling good. He reports continued tingling, stiffness and pain in his fingers, toes, and joints which is worst in the morning, improved with movement, and for which he is taking oxycodone every 8 hours. He continues to smoke and is smoking 1 ppd. He denies skin rash.   REVIEW OF SYSTEMS:  Review of Systems  Constitutional:  Negative for appetite change (50%) and fatigue (70%).  Respiratory:  Positive for cough and shortness of breath.   Gastrointestinal:  Positive for diarrhea and nausea.  Musculoskeletal:  Positive for arthralgias (8/10  fingers, toes, and joints).  Skin:  Negative for rash.  Neurological:  Positive for numbness.  Psychiatric/Behavioral:  Positive for sleep disturbance.   All other systems reviewed and are negative.  PAST MEDICAL/SURGICAL HISTORY:  Past Medical History:  Diagnosis Date   Cancer [facility])    Lung cancer   COPD (chronic obstructive pulmonary disease) ([place]) 2007   EMPHYSEMA   Depression    Dyspnea    occasional - no oxygen   GERD (gastroesophageal reflux disease) 2010   Hepatitis    Alcoholic    HLD (hyperlipidemia)    no meds   Hypertension    Port-A-Cath in place 01/01/2021   Past Surgical History:  Procedure Laterality Date   BRONCHIAL BIOPSY  12/23/2020   Procedure: BRONCHIAL BIOPSIES;  Surgeon: [name], MD;  Location: [place];  Service: Pulmonary;;   BRONCHIAL BRUSHINGS  12/23/2020   Procedure: BRONCHIAL BRUSHINGS;  Surgeon: [name], MD;  Location: [place];  Service: Pulmonary;;   BRONCHIAL NEEDLE ASPIRATION BIOPSY  12/23/2020   Procedure: BRONCHIAL NEEDLE ASPIRATION BIOPSIES;  Surgeon: [name], MD;  Location: [place];  Service: Pulmonary;;   BRONCHIAL WASHINGS  12/23/2020   Procedure: BRONCHIAL WASHINGS;  Surgeon: [name], MD;  Location: [facility] ENDOSCOPY;  Service: Pulmonary;;   CARDIAC CATHETERIZATION  2010   COLONOSCOPY  03/14/2012   Procedure: COLONOSCOPY;  Surgeon: Rogene Houston, MD;  [facility];  Service: Endoscopy;  Laterality: N/A;  100   INTERCOSTAL NERVE BLOCK  04/21/2021   Procedure: INTERCOSTAL NERVE BLOCK;  Surgeon: Melrose Nakayama, MD;  Location: [place];  Service: Thoracic;;   IR IMAGING GUIDED PORT INSERTION  01/12/2021   VIDEO ASSISTED THORACOSCOPY (VATS)/ LOBECTOMY Left 04/21/2021   Procedure: VIDEO ASSISTED THORACOSCOPY (VATS)/ LOBECTOMY;  Surgeon: Melrose Nakayama, MD;  Location: [place];  Service: Thoracic;  Laterality: Left;   VIDEO BRONCHOSCOPY WITH ENDOBRONCHIAL NAVIGATION N/A 12/23/2020    Procedure: VIDEO BRONCHOSCOPY WITH ENDOBRONCHIAL NAVIGATION;  Surgeon: [name], MD;  Location: [place] ENDOSCOPY;  Service: Pulmonary;  Laterality: N/A;   WRIST GANGLION EXCISION  1985    SOCIAL HISTORY:  Social History   Socioeconomic History   Marital status: Widowed    Spouse name: Not on file   Number of children: Not on file   Years of education: Not on file   Highest education level: Not on file  Occupational History   Not on file  Tobacco Use   Smoking status: Every Day    Packs/day: 1.00    Years: 37.00    Pack years: 37.00    Types: Cigarettes   Smokeless tobacco: Never   Tobacco comments:    smokes 1/2 pack a day [facility] 03/08/21  Vaping Use   Vaping Use: Never used  Substance and Sexual Activity   Alcohol use: Yes    Alcohol/week: 56.0 standard drinks    Types: 35 Cans of beer, 21 Shots of liquor per week    Comment: 5-6 beers and 2-3 shots daily-04/19/21   Drug use: No   Sexual activity: Yes  Other Topics Concern   Not on file  Social History Narrative   Not on file   Social Determinants of Health   Financial Resource Strain: Not on file  Food Insecurity: Not on file  Transportation Needs: No Transportation Needs   Lack of Transportation (Medical): No   Lack of Transportation (Non-Medical): No  Physical Activity: Inactive   Days of Exercise per Week: 0 days   Minutes of Exercise per Session: 0 min  Stress: Not on file  Social Connections: Not on file  Intimate Partner Violence: Not At Risk   Fear of Current or Ex-Partner: No   Emotionally Abused: No   Physically Abused: No   Sexually Abused: No    FAMILY HISTORY:  Family History  Problem Relation Age of Onset   Hypertension Mother    Asthma Father    Diabetes Sister    Hypertension Sister    Hypertension Brother    Hypertension Sister    Lupus Daughter     CURRENT MEDICATIONS:  Current Outpatient Medications  Medication Sig Dispense Refill   acetaminophen (TYLENOL) 500 MG tablet Take 2  tablets (1,000 mg total) by mouth every 6 (six) hours as needed. 30 tablet 0   amLODipine (NORVASC) 5 MG tablet Take 5 mg by mouth daily.     aspirin 81 MG tablet Take 81 mg by mouth daily.     CARBOPLATIN IV Inject into the vein every 21 ( twenty-one) days.     Fluticasone-Umeclidin-Vilant (TRELEGY ELLIPTA) 100-62.5-25 MCG/INH AEPB Inhale 1 puff into the lungs daily in the afternoon.     gabapentin (NEURONTIN) 300 MG capsule Take 1 capsule (300 mg total) by mouth 3 (three) times daily. 90 capsule 3   metoprolol tartrate (LOPRESSOR) 25 MG tablet Take 25 mg by mouth 2 (two) times daily.     Multiple Vitamin (MULTIVITAMIN WITH MINERALS) TABS tablet Take 1 tablet by mouth daily.     naloxone (NARCAN) nasal spray 4 mg/0.1  mL Spray 0.1 mL (contents of one device) into one nostril upon signs of opioid overdose.  Call 911.  May repeat once if no response within 2-3 minutes. 2 each 0   NIVOLUMAB IV Inject 360 mg into the vein every 21 ( twenty-one) days.     omeprazole (PRILOSEC) 40 MG capsule Take 40 mg by mouth daily.     Oxycodone HCl 10 MG TABS Take 1 tablet (10 mg total) by mouth every 8 (eight) hours as needed. 90 tablet 0   PACLITAXEL IV Inject 200 mg/m2 into the vein every 21 ( twenty-one) days.     PROVENTIL HFA 108 (90 Base) MCG/ACT inhaler INHALE 2 PUFFS BY MOUTH EVERY 6 HOURS AS NEEDED FOR COUGHING, WHEEZING, OR SHORTNESS OF BREATH 20.1 g 1   sildenafil (VIAGRA) 50 MG tablet Take 50 mg by mouth daily as needed.     lidocaine-prilocaine (EMLA) cream Apply small amount to port a cath site and cover with plastic wrap 1 hour prior to infusion appointments (Patient not taking: Reported on 08/05/2021) 30 g 3   prochlorperazine (COMPAZINE) 10 MG tablet TAKE 1 TABLET BY MOUTH EVERY 6 HOURS AS NEEDED FOR NAUSEA OR VOMITING (Patient not taking: Reported on 08/05/2021) 30 tablet 0   No current facility-administered medications for this visit.    ALLERGIES:  Allergies  Allergen Reactions   Other  Nausea Only and Other (See Comments)    No spicy foods- Patient cannot tolerate- HEARTBURN    PHYSICAL EXAM:  Performance status (ECOG): 1 - Symptomatic but completely ambulatory  Vitals:   08/05/21 1418  BP: 116/77  Pulse: 97  Resp: 16  Temp: 98 F (36.7 C)   Wt Readings from Last 3 Encounters:  08/05/21 153 lb (69.4 kg)  06/08/21 154 lb 6.4 oz (70 kg)  05/11/21 154 lb 9.6 oz (70.1 kg)   Physical Exam Vitals reviewed.  Constitutional:      Appearance: Normal appearance.  Cardiovascular:     Rate and Rhythm: Normal rate and regular rhythm.     Pulses: Normal pulses.     Heart sounds: Normal heart sounds.  Pulmonary:     Effort: Pulmonary effort is normal.     Breath sounds: Normal breath sounds.  Neurological:     General: No focal deficit present.     Mental Status: He is alert and oriented to person, place, and time.  Psychiatric:        Mood and Affect: Mood normal.        Behavior: Behavior normal.     LABORATORY DATA:  I have reviewed the labs as listed.  CBC Latest Ref Rng & Units 06/08/2021 05/11/2021 04/23/2021  WBC 4.0 - 10.5 K/uL 8.8 11.0(H) 13.0(H)  Hemoglobin 13.0 - 17.0 g/dL 13.5 12.9(L) 11.8(L)  Hematocrit 39.0 - 52.0 % 41.3 39.7 35.8(L)  Platelets 150 - 400 K/uL 319 346 267   CMP Latest Ref Rng & Units 07/29/2021 06/08/2021 05/11/2021  Glucose 70 - 99 mg/dL - 115(H) 104(H)  BUN 6 - 20 mg/dL - 5(L) 5(L)  Creatinine 0.61 - 1.24 mg/dL 0.70 0.51(L) 0.48(L)  Sodium 135 - 145 mmol/L - 136 137  Potassium 3.5 - 5.1 mmol/L - 3.5 3.5  Chloride 98 - 111 mmol/L - 102 103  CO2 22 - 32 mmol/L - 25 25  Calcium 8.9 - 10.3 mg/dL - 8.8(L) 8.9  Total Protein 6.5 - 8.1 g/dL - 8.0 7.9  Total Bilirubin 0.3 - 1.2 mg/dL - 0.4 0.5  Alkaline  Phos 38 - 126 U/L - 94 94  AST 15 - 41 U/L - 53(H) 31  ALT 0 - 44 U/L - 23 16    DIAGNOSTIC IMAGING:  I have independently reviewed the scans and discussed with the patient. CT Chest W Contrast  Result Date: 07/30/2021 CLINICAL  DATA:  Left upper lobe lung cancer restaging, status post left upper lobectomy EXAM: CT CHEST WITH CONTRAST TECHNIQUE: Multidetector CT imaging of the chest was performed during intravenous contrast administration. CONTRAST:  58m OMNIPAQUE IOHEXOL 300 MG/ML  SOLN COMPARISON:  03/15/2021 FINDINGS: Cardiovascular: Right chest port catheter. Aortic atherosclerosis. Normal heart size. Three-vessel coronary artery calcifications. No pericardial effusion. Mediastinum/Nodes: No enlarged mediastinal, hilar, or axillary lymph nodes. Thyroid gland, trachea, and esophagus demonstrate no significant findings. Lungs/Pleura: Status post interval left upper lobectomy with a small, likely loculated left pleural effusion. Moderate underlying centrilobular and paraseptal emphysema and diffuse bilateral bronchial wall thickening. No pleural effusion or pneumothorax. Upper Abdomen: No acute abnormality.  Hepatic steatosis. Musculoskeletal: No chest wall mass or suspicious bone lesions identified. IMPRESSION: 1. Status post interval left upper lobectomy with a small, likely loculated left pleural effusion. 2. No evidence of recurrent or metastatic disease in the chest. 3. Emphysema. 4. Coronary artery disease. 5. Hepatic steatosis. Aortic Atherosclerosis (ICD10-I70.0) and Emphysema (ICD10-J43.9). Electronically Signed   By: ADelanna AhmadiM.D.   On: 07/30/2021 09:44     ASSESSMENT:  1.  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 SHalford Chessmanfor left upper lobe lung mass. - Is current active smoker, 1/2 pack/day for 35 years. - CT lung cancer screening scan on 10/16/2020 showed cavitary mass in the anterior left upper lobe measuring 4.4 x 3.0 x 3.5 cm with 2.5 cm solid nodular component inferomedially.  This abuts and involves the anterior pleura/chest wall.  Expansile lytic lesion in the right posterolateral ninth and 10th ribs (unchanged dating back to CT from 06/16/2016).  10 mm short axis AP  window node suspicious for nodal metastasis. - No change in baseline cough.  No hemoptysis.  No chest pain. - Brain MRI was negative for metastatic disease. - PET scan on 12/14/2020 showed left upper lobe mass measuring 3.9 x 4 cm with SUV 17.1.  Some contiguous hypermetabolism along the left chest wall including anterolateral left third rib with SUV 5.4.  No gross osseous destruction.  AP window lymph node of 9 mm, mildly hypermetabolic, SUV 2.8. - On 56/29/4765left upper lobe brushing and lavage consistent with squamous cell carcinoma. - Left upper lobe fine-needle aspiration was also consistent with squamous cell carcinoma.  Lymph node could not be reached. - Caris testing with limited tissue showed MMR proficient, BRAF V6 energy negative, PD-L1 negative by 22 C3, 28-8, SP142.  (IHC/CISH results should be interpreted with caution given the potential for false negative results) - 3 cycles of carboplatin, paclitaxel and nivolumab from 01/13/2021 through 02/24/2021 based on [id number]. - Left upper lobectomy and lymph node resection on 04/21/2021. - Pathology consistent with invasive moderately differentiated squamous cell carcinoma, 5 cm, margins negative.  Visceral pleura not involved.  Metastatic squamous cell carcinoma in 1/2 hilar lymph nodes.  Levels 5, 11, 12, 10, 11 and 12 lymph nodes were negative.  No definitive tumor response identified.  No lymphovascular invasion.  ypT2b, ypN1. - Caris testing on resection sample from 04/21/2021 shows PD-L1 TPS 1%.  No targetable mutations.  MSI stable.  TMB low.   2.  Social/family history: - Previously worked in [employer] fund[employer], currently on disability. - Current active smoker, 1/2 pack/day for 35 years. - Drinks 6 pack of beers along with 1 quart of liquor every day. - 1 brother had throat cancer and another brother had lung cancer.   PLAN:  1.  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  No  hemoptysis or chest pains. - We reviewed CT chest with contrast from 07/29/2021 showing postsurgical changes with no evidence of recurrence or metastatic disease.  Hepatic steatosis was seen. - He is still smoking 1 pack/day of cigarettes. - I have extensively counseled him to quit smoking. - RTC 6 months with repeat CT scan of the chest with contrast.   2.  Joint pains: - He has pains in ankles, knees and wrists. - He also reports stiffness in the joints early in the morning, lasting more than 30 minutes. - He is currently taking oxycodone 10 mg 3 times daily as needed.  I have told him to cut back to twice daily.  He was told to also take NSAIDs in between. - Differential diagnosis includes joint pains caused by immunotherapy.  His last treatment was on 02/24/2021. - We will make referral to rheumatology.  3.  Neuropathy: - Numbness in the fingertips and toes is stable. - Continue gabapentin 300 mg 3 times daily.   Orders placed this encounter:  No orders of the defined types were placed in this encounter.    Derek Jack, MD [place] [phone]   I, Thana Ates, am acting as a scribe for Dr. Derek Jack.  I, Derek Jack MD, have reviewed the above documentation for accuracy and completeness, and I agree with the above.

## 2021-08-05 ENCOUNTER — Other Ambulatory Visit: Payer: Self-pay

## 2021-08-05 ENCOUNTER — Inpatient Hospital Stay (HOSPITAL_COMMUNITY): Payer: Medicare HMO | Attending: Hematology | Admitting: Hematology

## 2021-08-05 VITALS — BP 116/77 | HR 97 | Temp 98.0°F | Resp 16 | Ht 69.0 in | Wt 153.0 lb

## 2021-08-05 DIAGNOSIS — Z808 Family history of malignant neoplasm of other organs or systems: Secondary | ICD-10-CM | POA: Diagnosis not present

## 2021-08-05 DIAGNOSIS — F17210 Nicotine dependence, cigarettes, uncomplicated: Secondary | ICD-10-CM | POA: Insufficient documentation

## 2021-08-05 DIAGNOSIS — I10 Essential (primary) hypertension: Secondary | ICD-10-CM

## 2021-08-05 DIAGNOSIS — M25572 Pain in left ankle and joints of left foot: Secondary | ICD-10-CM

## 2021-08-05 DIAGNOSIS — G629 Polyneuropathy, unspecified: Secondary | ICD-10-CM | POA: Diagnosis not present

## 2021-08-05 DIAGNOSIS — M25561 Pain in right knee: Secondary | ICD-10-CM | POA: Insufficient documentation

## 2021-08-05 DIAGNOSIS — M25571 Pain in right ankle and joints of right foot: Secondary | ICD-10-CM | POA: Insufficient documentation

## 2021-08-05 DIAGNOSIS — C3492 Malignant neoplasm of unspecified part of left bronchus or lung: Secondary | ICD-10-CM

## 2021-08-05 DIAGNOSIS — M25562 Pain in left knee: Secondary | ICD-10-CM | POA: Diagnosis not present

## 2021-08-05 DIAGNOSIS — M25532 Pain in left wrist: Secondary | ICD-10-CM | POA: Diagnosis not present

## 2021-08-05 DIAGNOSIS — R69 Illness, unspecified: Secondary | ICD-10-CM | POA: Diagnosis not present

## 2021-08-05 DIAGNOSIS — C3412 Malignant neoplasm of upper lobe, left bronchus or lung: Secondary | ICD-10-CM | POA: Diagnosis not present

## 2021-08-05 DIAGNOSIS — Z801 Family history of malignant neoplasm of trachea, bronchus and lung: Secondary | ICD-10-CM

## 2021-08-05 DIAGNOSIS — M25531 Pain in right wrist: Secondary | ICD-10-CM | POA: Diagnosis not present

## 2021-08-05 DIAGNOSIS — K760 Fatty (change of) liver, not elsewhere classified: Secondary | ICD-10-CM | POA: Insufficient documentation

## 2021-08-05 NOTE — Patient Instructions (Addendum)
[place] at [facility] Discharge Instructions  You were seen and examined today by Dr. Delton Coombes. He reviewed your most recent labs and CT scan and everything looks okay. CT scan shows that you have a fatty liver, some hardening in the arteries, COPD and a small pocket of fluid on your lungs that is likely surgical changes. He wants you to try some advil in between doses of pain medication. He recommends that you quit smoking. Please keep follow up appointment as scheduled in 6 months.   Thank you for choosing [place] at [facility] to provide your oncology and hematology care.  To afford each patient quality time with our provider, please arrive at least 15 minutes before your scheduled appointment time.   If you have a lab appointment with the [facility] please come in thru the Main Entrance and check in at the main information desk.  You need to re-schedule your appointment should you arrive 10 or more minutes late.  We strive to give you quality time with our providers, and arriving late affects you and other patients whose appointments are after yours.  Also, if you no show three or more times for appointments you may be dismissed from the clinic at the providers discretion.     Again, thank you for choosing [facility].  Our hope is that these requests will decrease the amount of time that you wait before being seen by our physicians.       _____________________________________________________________  Should you have questions after your visit to [facility], please contact our office at [phone] and follow the prompts.  Our office hours are 8:00 a.m. and 4:30 p.m. Monday - Friday.  Please note that voicemails left after 4:00 p.m. may not be returned until the following business day.  We are closed weekends and major holidays.  You do have access to a nurse 24-7, just call the main number to the clinic  [phone] and do not press any options, hold on the line and a nurse will answer the phone.    For prescription refill requests, have your pharmacy contact our office and allow 72 hours.    Due to Covid, you will need to wear a mask upon entering the hospital. If you do not have a mask, a mask will be given to you at the Main Entrance upon arrival. For doctor visits, patients may have 1 support person age 77 or older with them. For treatment visits, patients can not have anyone with them due to social distancing guidelines and our immunocompromised population.

## 2021-08-08 HISTORY — PX: LUNG REMOVAL, PARTIAL: SHX233

## 2021-08-16 ENCOUNTER — Other Ambulatory Visit (HOSPITAL_COMMUNITY): Payer: Self-pay | Admitting: Hematology

## 2021-08-16 DIAGNOSIS — Z95828 Presence of other vascular implants and grafts: Secondary | ICD-10-CM

## 2021-08-16 DIAGNOSIS — C3492 Malignant neoplasm of unspecified part of left bronchus or lung: Secondary | ICD-10-CM

## 2021-08-20 DIAGNOSIS — C3492 Malignant neoplasm of unspecified part of left bronchus or lung: Secondary | ICD-10-CM | POA: Diagnosis not present

## 2021-08-20 DIAGNOSIS — I10 Essential (primary) hypertension: Secondary | ICD-10-CM | POA: Diagnosis not present

## 2021-08-31 ENCOUNTER — Encounter (HOSPITAL_COMMUNITY): Payer: Self-pay

## 2021-09-06 ENCOUNTER — Encounter (HOSPITAL_COMMUNITY): Payer: Self-pay

## 2021-09-06 ENCOUNTER — Other Ambulatory Visit (HOSPITAL_COMMUNITY): Payer: Self-pay | Admitting: *Deleted

## 2021-09-06 DIAGNOSIS — C3492 Malignant neoplasm of unspecified part of left bronchus or lung: Secondary | ICD-10-CM

## 2021-09-07 ENCOUNTER — Encounter (HOSPITAL_COMMUNITY): Payer: Self-pay | Admitting: Hematology

## 2021-09-07 MED ORDER — OXYCODONE HCL 10 MG PO TABS
10.0000 mg | ORAL_TABLET | Freq: Two times a day (BID) | ORAL | 0 refills | Status: DC | PRN
Start: 2021-09-07 — End: 2021-09-30

## 2021-09-09 DIAGNOSIS — J449 Chronic obstructive pulmonary disease, unspecified: Secondary | ICD-10-CM | POA: Diagnosis not present

## 2021-09-09 DIAGNOSIS — F17210 Nicotine dependence, cigarettes, uncomplicated: Secondary | ICD-10-CM | POA: Diagnosis not present

## 2021-09-09 DIAGNOSIS — R69 Illness, unspecified: Secondary | ICD-10-CM | POA: Diagnosis not present

## 2021-09-09 DIAGNOSIS — Z0001 Encounter for general adult medical examination with abnormal findings: Secondary | ICD-10-CM | POA: Diagnosis not present

## 2021-09-09 DIAGNOSIS — C3492 Malignant neoplasm of unspecified part of left bronchus or lung: Secondary | ICD-10-CM | POA: Diagnosis not present

## 2021-09-09 DIAGNOSIS — Z1331 Encounter for screening for depression: Secondary | ICD-10-CM | POA: Diagnosis not present

## 2021-09-09 DIAGNOSIS — I10 Essential (primary) hypertension: Secondary | ICD-10-CM | POA: Diagnosis not present

## 2021-09-09 DIAGNOSIS — Z1389 Encounter for screening for other disorder: Secondary | ICD-10-CM | POA: Diagnosis not present

## 2021-09-23 ENCOUNTER — Ambulatory Visit (HOSPITAL_COMMUNITY)
Admission: RE | Admit: 2021-09-23 | Discharge: 2021-09-23 | Disposition: A | Discharge status: Home / Self Care | Payer: Medicare HMO | Source: Ambulatory Visit | Attending: Internal Medicine | Admitting: Internal Medicine

## 2021-09-23 DIAGNOSIS — R000 Tachycardia, unspecified: Secondary | ICD-10-CM | POA: Insufficient documentation

## 2021-09-23 DIAGNOSIS — R69 Illness, unspecified: Secondary | ICD-10-CM | POA: Diagnosis not present

## 2021-09-30 ENCOUNTER — Other Ambulatory Visit (HOSPITAL_COMMUNITY): Payer: Self-pay

## 2021-09-30 ENCOUNTER — Encounter (HOSPITAL_COMMUNITY): Payer: Self-pay

## 2021-09-30 DIAGNOSIS — C3492 Malignant neoplasm of unspecified part of left bronchus or lung: Secondary | ICD-10-CM

## 2021-09-30 MED ORDER — OXYCODONE HCL 10 MG PO TABS
10.0000 mg | ORAL_TABLET | Freq: Two times a day (BID) | ORAL | 0 refills | Status: DC | PRN
Start: 2021-09-30 — End: 2021-10-06

## 2021-09-30 MED ORDER — GABAPENTIN 300 MG PO CAPS: 300.0000 mg | ORAL_CAPSULE | Freq: Three times a day (TID) | ORAL | 3 refills | Status: DC

## 2021-09-30 NOTE — Addendum Note (Signed)
Addended by: Renda Rolls A on: 09/30/2021 03:02 PM   Modules accepted: Orders

## 2021-10-05 ENCOUNTER — Encounter (HOSPITAL_COMMUNITY): Payer: Self-pay

## 2021-10-06 ENCOUNTER — Other Ambulatory Visit (HOSPITAL_COMMUNITY): Payer: Self-pay | Admitting: *Deleted

## 2021-10-06 DIAGNOSIS — C3492 Malignant neoplasm of unspecified part of left bronchus or lung: Secondary | ICD-10-CM

## 2021-10-06 MED ORDER — OXYCODONE HCL 10 MG PO TABS: 10.0000 mg | ORAL_TABLET | Freq: Two times a day (BID) | ORAL | 0 refills | Status: DC | PRN

## 2021-10-06 NOTE — Progress Notes (Signed)
CARDIOLOGY CONSULT NOTE       Patient ID: Bryan Fleming MRN: [id number] DOB/AGE: 60-Jul-1962 60 y.o.  Admit date: (Not on file) Referring Physician: Nickerson Primary Physician: Carrolyn Meiers, MD Primary Cardiologist: New Reason for Consultation: Cardiomegaly  Active Problems:   * No active hospital problems. *   HPI:  61 y.o. 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 From immunoRx  Myovue done 04/01/21 suggested small IMI no significant ischemia EF normal 55% no RWMA TTE 04/06/21 EF > 75% moderate LVH possible small LVOT /mid cavity gradient trivial MR and mild AR   He is not sure why he is here Seems to think his cancer is doing well Still smoking despite Lung CA   ROS All other systems reviewed and negative except as noted above  Past Medical History:  Diagnosis Date   Cancer ([place])    Lung cancer   COPD (chronic obstructive pulmonary disease) ([place]) 2007   EMPHYSEMA   Depression    Dyspnea    occasional - no oxygen   GERD (gastroesophageal reflux disease) 2010   Hepatitis    Alcoholic    HLD (hyperlipidemia)    no meds   Hypertension    Port-A-Cath in place 01/01/2021    Family History  Problem Relation Age of Onset   Hypertension Mother    Asthma Father    Diabetes Sister    Hypertension Sister    Hypertension Brother    Hypertension Sister    Lupus Daughter     Social History   Socioeconomic History   Marital status: Widowed    Spouse name: Not on file   Number of children: Not on file   Years of education: Not on file   Highest education level: Not on file  Occupational History    Not on file  Tobacco Use   Smoking status: Every Day    Packs/day: 1.00    Years: 37.00    Pack years: 37.00    Types: Cigarettes   Smokeless tobacco: Never   Tobacco comments:    smokes 1/2 pack a day [facility] 03/08/21  Vaping Use   Vaping Use: Never used  Substance and Sexual Activity   Alcohol use: Yes    Alcohol/week: 56.0 standard drinks    Types: 35 Cans of beer, 21 Shots of liquor per week    Comment: 5-6 beers and 2-3 shots daily-04/19/21   Drug use: No   Sexual activity: Yes  Other Topics Concern   Not on file  Social History Narrative   Not on file   Social Determinants of Health   Financial Resource Strain: Not on file  Food Insecurity: Not on file  Transportation Needs: No Transportation Needs   Lack of Transportation (Medical): No   Lack of Transportation (Non-Medical): No  Physical Activity: Inactive   Days of Exercise per Week: 0 days   Minutes of Exercise per Session: 0 min  Stress: Not on file  Social Connections: Not on file  Intimate Partner Violence: Not At Risk   Fear of Current or Ex-Partner: No   Emotionally Abused:  No   Physically Abused: No   Sexually Abused: No    Past Surgical History:  Procedure Laterality Date   BRONCHIAL BIOPSY  12/23/2020   Procedure: BRONCHIAL BIOPSIES;  Surgeon: [name], MD;  Location: [facility] ENDOSCOPY;  Service: Pulmonary;;   BRONCHIAL BRUSHINGS  12/23/2020   Procedure: BRONCHIAL BRUSHINGS;  Surgeon: [name], MD;  Location: [facility] ENDOSCOPY;  Service: Pulmonary;;   BRONCHIAL NEEDLE ASPIRATION BIOPSY  12/23/2020   Procedure: BRONCHIAL NEEDLE ASPIRATION BIOPSIES;  Surgeon: [name], MD;  Location: [place];  Service: Pulmonary;;   BRONCHIAL WASHINGS  12/23/2020   Procedure: BRONCHIAL WASHINGS;  Surgeon: [name], MD;  Location: [facility] ENDOSCOPY;  Service: Pulmonary;;   CARDIAC CATHETERIZATION  2010   COLONOSCOPY  03/14/2012   Procedure: COLONOSCOPY;  Surgeon: Rogene Houston, MD;  Location: [facility] ENDO SUITE;   Service: Endoscopy;  Laterality: N/A;  100   INTERCOSTAL NERVE BLOCK  04/21/2021   Procedure: INTERCOSTAL NERVE BLOCK;  Surgeon: Melrose Nakayama, MD;  Location: [place];  Service: Thoracic;;   IR IMAGING GUIDED PORT INSERTION  01/12/2021   VIDEO ASSISTED THORACOSCOPY (VATS)/ LOBECTOMY Left 04/21/2021   Procedure: VIDEO ASSISTED THORACOSCOPY (VATS)/ LOBECTOMY;  Surgeon: Melrose Nakayama, MD;  Location: [place];  Service: Thoracic;  Laterality: Left;   VIDEO BRONCHOSCOPY WITH ENDOBRONCHIAL NAVIGATION N/A 12/23/2020   Procedure: VIDEO BRONCHOSCOPY WITH ENDOBRONCHIAL NAVIGATION;  Surgeon: [name], MD;  Location: [place] ENDOSCOPY;  Service: Pulmonary;  Laterality: N/A;   WRIST GANGLION EXCISION  1985      Current Outpatient Medications:    acetaminophen (TYLENOL) 500 MG tablet, Take 2 tablets (1,000 mg total) by mouth every 6 (six) hours as needed., Disp: 30 tablet, Rfl: 0   amLODipine (NORVASC) 5 MG tablet, Take 5 mg by mouth daily., Disp: , Rfl:    aspirin 81 MG tablet, Take 81 mg by mouth daily., Disp: , Rfl:    CARBOPLATIN IV, Inject into the vein every 21 ( twenty-one) days., Disp: , Rfl:    Fluticasone-Umeclidin-Vilant (TRELEGY ELLIPTA) 100-62.5-25 MCG/INH AEPB, Inhale 1 puff into the lungs daily in the afternoon., Disp: , Rfl:    gabapentin (NEURONTIN) 300 MG capsule, Take 1 capsule (300 mg total) by mouth 3 (three) times daily., Disp: 90 capsule, Rfl: 3   lidocaine-prilocaine (EMLA) cream, Apply small amount to port a cath site and cover with plastic wrap 1 hour prior to infusion appointments (Patient not taking: Reported on 08/05/2021), Disp: 30 g, Rfl: 3   metoprolol tartrate (LOPRESSOR) 25 MG tablet, Take 25 mg by mouth 2 (two) times daily., Disp: , Rfl:    Multiple Vitamin (MULTIVITAMIN WITH MINERALS) TABS tablet, Take 1 tablet by mouth daily., Disp: , Rfl:    naloxone (NARCAN) nasal spray 4 mg/0.1 mL, Spray 0.1 mL (contents of one device) into one nostril upon signs of opioid  overdose.  Call 911.  May repeat once if no response within 2-3 minutes., Disp: 2 each, Rfl: 0   NIVOLUMAB IV, Inject 360 mg into the vein every 21 ( twenty-one) days., Disp: , Rfl:    omeprazole (PRILOSEC) 40 MG capsule, Take 40 mg by mouth daily., Disp: , Rfl:    Oxycodone HCl 10 MG TABS, Take 1 tablet (10 mg total) by mouth 2 (two) times daily as needed., Disp: 60 tablet, Rfl: 0   PACLITAXEL IV, Inject 200 mg/m2 into the vein every 21 ( twenty-one) days., Disp: , Rfl:    prochlorperazine (COMPAZINE) 10 MG  tablet, TAKE 1 TABLET BY MOUTH EVERY 6 HOURS AS NEEDED FOR NAUSEA FOR VOMITING, Disp: 60 tablet, Rfl: 6   PROVENTIL HFA 108 (90 Base) MCG/ACT inhaler, INHALE 2 PUFFS BY MOUTH EVERY 6 HOURS AS NEEDED FOR COUGHING, WHEEZING, OR SHORTNESS OF BREATH, Disp: 20.1 g, Rfl: 1   sildenafil (VIAGRA) 50 MG tablet, Take 50 mg by mouth daily as needed., Disp: , Rfl:     Physical Exam: There were no vitals taken for this visit.    Chronically ill male  HEENT: right sided subclavian port a cath  Neck supple with no adenopathy JVP normal no bruits no thyromegaly Lungs emphysema exp wheezes post LULobectomy Heart:  S1/S2 no  murmur, no rub, gallop or click PMI normal Abdomen: benighn, BS positve, no tenderness, no AAA no bruit.  No HSM or HJR Distal pulses intact with no bruits No edema Neuro non-focal Skin warm and dry No muscular weakness   Labs:   Lab Results  Component Value Date   WBC 8.8 06/08/2021   HGB 13.5 06/08/2021   HCT 41.3 06/08/2021   MCV 96.3 06/08/2021   PLT 319 06/08/2021   No results for input(s): NA, K, CL, CO2, BUN, CREATININE, CALCIUM, PROT, BILITOT, ALKPHOS, ALT, AST, GLUCOSE in the last 168 hours.  Invalid input(s): LABALBU Lab Results  Component Value Date   CKTOTAL 158 03/12/2007   CKMB 1.2 03/12/2007   TROPONINI <0.03 12/16/2017    Lab Results  Component Value Date   CHOL 165 06/05/2020   CHOL 196 09/03/2019   CHOL 174 08/20/2015   Lab Results   Component Value Date   HDL 59 06/05/2020   HDL 63 09/03/2019   HDL 59 08/20/2015   Lab Results  Component Value Date   LDLCALC 84 06/05/2020   LDLCALC 110 (H) 09/03/2019   LDLCALC 90 08/20/2015   Lab Results  Component Value Date   TRIG 111 06/05/2020   TRIG 114 09/03/2019   TRIG 126 08/20/2015   Lab Results  Component Value Date   CHOLHDL 2.8 06/05/2020   CHOLHDL 3.1 09/03/2019   CHOLHDL 2.9 08/20/2015   No results found for: LDLDIRECT    Radiology: No results found.  EKG: ST rate 122 poor R wave progression   ASSESSMENT AND PLAN:   Cardiomegaly:  not clear of diagnosis EF normal with moderate LVH on TTE August 2022 update echo given chemoRx HTN:  Well controlled.  Continue current medications and low sodium Dash type diet.   Lung Cancer:  still smoking f/u oncology known metastatic disease currently observation post chemo and LULobectomy Port a cath in place ETOH:  counseled on moderation AST minimally elevated 53 labs 06/08/21  Neuropathy:  cancer/chemo on gabapentin Arthralgias:  been referred to rheumatology ? From immunoRx continue oxycodone   TTE  F/U cardiology PRN  Signed: Jenkins Rouge 10/06/2021, 4:46 PM

## 2021-10-11 ENCOUNTER — Encounter: Payer: Self-pay | Admitting: Cardiovascular Disease

## 2021-10-11 ENCOUNTER — Other Ambulatory Visit: Payer: Self-pay

## 2021-10-11 ENCOUNTER — Ambulatory Visit (INDEPENDENT_AMBULATORY_CARE_PROVIDER_SITE_OTHER): Payer: Medicare HMO | Admitting: Cardiovascular Disease

## 2021-10-11 VITALS — BP 120/80 | HR 82 | Ht 69.0 in | Wt 151.6 lb

## 2021-10-11 DIAGNOSIS — I517 Cardiomegaly: Secondary | ICD-10-CM

## 2021-10-11 DIAGNOSIS — C3492 Malignant neoplasm of unspecified part of left bronchus or lung: Secondary | ICD-10-CM

## 2021-10-11 DIAGNOSIS — I350 Nonrheumatic aortic (valve) stenosis: Secondary | ICD-10-CM | POA: Diagnosis not present

## 2021-10-11 NOTE — Patient Instructions (Signed)
Medication Instructions:  ?Your physician recommends that you continue on your current medications as directed. Please refer to the Current Medication list given to you today. ? ? ?Labwork: ?None today ? ?Testing/Procedures: ?Your physician has requested that you have an echocardiogram. Echocardiography is a painless test that uses sound waves to create images of your heart. It provides your doctor with information about the size and shape of your heart and how well your heart?s chambers and valves are working. This procedure takes approximately one hour. There are no restrictions for this procedure. ? ? ?Follow-Up: ?1 year ? ?Any Other Special Instructions Will Be Listed Below (If Applicable). ? ?If you need a refill on your cardiac medications before your next appointment, please call your pharmacy. ? ?

## 2021-10-21 ENCOUNTER — Ambulatory Visit (HOSPITAL_COMMUNITY)
Admission: RE | Admit: 2021-10-21 | Discharge: 2021-10-21 | Disposition: A | Discharge status: Home / Self Care | Payer: Medicare HMO | Source: Ambulatory Visit | Attending: Cardiovascular Disease | Admitting: Cardiovascular Disease

## 2021-10-21 DIAGNOSIS — C3492 Malignant neoplasm of unspecified part of left bronchus or lung: Secondary | ICD-10-CM | POA: Diagnosis not present

## 2021-10-21 DIAGNOSIS — I517 Cardiomegaly: Secondary | ICD-10-CM | POA: Diagnosis not present

## 2021-10-21 DIAGNOSIS — I10 Essential (primary) hypertension: Secondary | ICD-10-CM | POA: Diagnosis not present

## 2021-10-21 LAB — ECHOCARDIOGRAM COMPLETE
AR max vel: 2.68 cm2
AV Area VTI: 2.8 cm2
AV Area mean vel: 2.13 cm2
AV Mean grad: 2 mmHg
AV Peak grad: 3.1 mmHg
Ao pk vel: 0.88 m/s
Area-P 1/2: 3.46 cm2
Calc EF: 66.3 %
MV VTI: 2.92 cm2
S' Lateral: 2.2 cm
Single Plane A2C EF: 71.4 %
Single Plane A4C EF: 59.3 %

## 2021-10-21 NOTE — Progress Notes (Signed)
*  PRELIMINARY RESULTS* ?Echocardiogram ?2D Echocardiogram has been performed. ? ?Bryan Fleming ?10/21/2021, 2:04 PM ?

## 2021-10-26 DIAGNOSIS — Z0100 Encounter for examination of eyes and vision without abnormal findings: Secondary | ICD-10-CM | POA: Diagnosis not present

## 2021-10-26 DIAGNOSIS — H5210 Myopia, unspecified eye: Secondary | ICD-10-CM | POA: Diagnosis not present

## 2021-11-04 ENCOUNTER — Encounter (HOSPITAL_COMMUNITY): Payer: Self-pay

## 2021-11-05 ENCOUNTER — Other Ambulatory Visit: Payer: Self-pay | Admitting: *Deleted

## 2021-11-05 ENCOUNTER — Inpatient Hospital Stay (HOSPITAL_COMMUNITY): Payer: Medicare HMO | Attending: Hematology

## 2021-11-05 DIAGNOSIS — R69 Illness, unspecified: Secondary | ICD-10-CM | POA: Diagnosis not present

## 2021-11-05 DIAGNOSIS — F17210 Nicotine dependence, cigarettes, uncomplicated: Secondary | ICD-10-CM | POA: Diagnosis not present

## 2021-11-05 DIAGNOSIS — C3412 Malignant neoplasm of upper lobe, left bronchus or lung: Secondary | ICD-10-CM | POA: Diagnosis not present

## 2021-11-05 DIAGNOSIS — C3492 Malignant neoplasm of unspecified part of left bronchus or lung: Secondary | ICD-10-CM

## 2021-11-05 MED ORDER — HEPARIN SOD (PORK) LOCK FLUSH 100 UNIT/ML IV SOLN
500.0000 [IU] | Freq: Once | INTRAVENOUS | Status: AC
  Administered 2021-11-05: 500 [IU] via INTRAVENOUS

## 2021-11-05 MED ORDER — SODIUM CHLORIDE 0.9% FLUSH
10.0000 mL | Freq: Once | INTRAVENOUS | Status: AC
  Administered 2021-11-05: 10 mL via INTRAVENOUS

## 2021-11-05 NOTE — Patient Instructions (Signed)
[facility]  Discharge Instructions: ?Thank you for choosing [facility] to provide your oncology and hematology care.  ?If you have a lab appointment with the [facility], please come in thru the Main Entrance and check in at the main information desk. ? ?Wear comfortable clothing and clothing appropriate for easy access to any Portacath or PICC line.  ? ?We strive to give you quality time with your provider. You may need to reschedule your appointment if you arrive late (15 or more minutes).  Arriving late affects you and other patients whose appointments are after yours.  Also, if you miss three or more appointments without notifying the office, you may be dismissed from the clinic at the provider?s discretion.    ?  ?For prescription refill requests, have your pharmacy contact our office and allow 72 hours for refills to be completed.   ? ?Today you received the following chemotherapy and/or immunotherapy agents Port flush    ?  ?To help prevent nausea and vomiting after your treatment, we encourage you to take your nausea medication as directed. ? ?BELOW ARE SYMPTOMS THAT SHOULD BE REPORTED IMMEDIATELY: ?*FEVER GREATER THAN 100.4 F (38 ?C) OR HIGHER ?*CHILLS OR SWEATING ?*NAUSEA AND VOMITING THAT IS NOT CONTROLLED WITH YOUR NAUSEA MEDICATION ?*UNUSUAL SHORTNESS OF BREATH ?*UNUSUAL BRUISING OR BLEEDING ?*URINARY PROBLEMS (pain or burning when urinating, or frequent urination) ?*BOWEL PROBLEMS (unusual diarrhea, constipation, pain near the anus) ?TENDERNESS IN MOUTH AND THROAT WITH OR WITHOUT PRESENCE OF ULCERS (sore throat, sores in mouth, or a toothache) ?UNUSUAL RASH, SWELLING OR PAIN  ?UNUSUAL VAGINAL DISCHARGE OR ITCHING  ? ?Items with * indicate a potential emergency and should be followed up as soon as possible or go to the Emergency Department if any problems should occur. ? ?Please show the CHEMOTHERAPY ALERT CARD or IMMUNOTHERAPY ALERT CARD at check-in to the Emergency  Department and triage nurse. ? ?Should you have questions after your visit or need to cancel or reschedule your appointment, please contact [facility] [phone]  and follow the prompts.  Office hours are 8:00 a.m. to 4:30 p.m. Monday - Friday. Please note that voicemails left after 4:00 p.m. may not be returned until the following business day.  We are closed weekends and major holidays. You have access to a nurse at all times for urgent questions. Please call the main number to the clinic [phone] and follow the prompts. ? ?For any non-urgent questions, you may also contact your provider using MyChart. We now offer e-Visits for anyone 57 and older to request care online for non-urgent symptoms. For details visit [email or web address]. ?  ?Also download the MyChart app! Go to the app store, search "MyChart", open the app, select [facility], and log in with your MyChart username and password. ? ?Due to Covid, a mask is required upon entering the hospital/clinic. If you do not have a mask, one will be given to you upon arrival. For doctor visits, patients may have 1 support person aged 32 or older with them. For treatment visits, patients cannot have anyone with them due to current Covid guidelines and our immunocompromised population.  ?

## 2021-11-05 NOTE — Progress Notes (Signed)
Patients port flushed without difficulty.  Good blood return noted with no bruising or swelling noted at site.  Band aid applied.  VSS with discharge and left in satisfactory condition with no s/s of distress noted.   

## 2021-11-08 ENCOUNTER — Encounter (HOSPITAL_COMMUNITY): Payer: Self-pay | Admitting: Hematology

## 2021-11-08 MED ORDER — OXYCODONE HCL 10 MG PO TABS: 10.0000 mg | ORAL_TABLET | Freq: Two times a day (BID) | ORAL | 0 refills | Status: DC | PRN

## 2021-11-08 NOTE — Telephone Encounter (Signed)
Refill

## 2021-11-21 DIAGNOSIS — R69 Illness, unspecified: Secondary | ICD-10-CM | POA: Diagnosis not present

## 2021-11-21 DIAGNOSIS — I10 Essential (primary) hypertension: Secondary | ICD-10-CM | POA: Diagnosis not present

## 2021-11-30 ENCOUNTER — Other Ambulatory Visit (HOSPITAL_COMMUNITY): Payer: Self-pay | Admitting: *Deleted

## 2021-11-30 ENCOUNTER — Encounter (HOSPITAL_COMMUNITY): Payer: Self-pay

## 2021-12-14 DIAGNOSIS — Z811 Family history of alcohol abuse and dependence: Secondary | ICD-10-CM | POA: Diagnosis not present

## 2021-12-14 DIAGNOSIS — J439 Emphysema, unspecified: Secondary | ICD-10-CM | POA: Diagnosis not present

## 2021-12-14 DIAGNOSIS — Z809 Family history of malignant neoplasm, unspecified: Secondary | ICD-10-CM | POA: Diagnosis not present

## 2021-12-14 DIAGNOSIS — Z7951 Long term (current) use of inhaled steroids: Secondary | ICD-10-CM | POA: Diagnosis not present

## 2021-12-14 DIAGNOSIS — K219 Gastro-esophageal reflux disease without esophagitis: Secondary | ICD-10-CM | POA: Diagnosis not present

## 2021-12-14 DIAGNOSIS — Z008 Encounter for other general examination: Secondary | ICD-10-CM | POA: Diagnosis not present

## 2021-12-14 DIAGNOSIS — I10 Essential (primary) hypertension: Secondary | ICD-10-CM | POA: Diagnosis not present

## 2021-12-14 DIAGNOSIS — R69 Illness, unspecified: Secondary | ICD-10-CM | POA: Diagnosis not present

## 2021-12-14 DIAGNOSIS — I739 Peripheral vascular disease, unspecified: Secondary | ICD-10-CM | POA: Diagnosis not present

## 2021-12-14 DIAGNOSIS — Z79891 Long term (current) use of opiate analgesic: Secondary | ICD-10-CM | POA: Diagnosis not present

## 2021-12-14 DIAGNOSIS — Z8249 Family history of ischemic heart disease and other diseases of the circulatory system: Secondary | ICD-10-CM | POA: Diagnosis not present

## 2021-12-20 ENCOUNTER — Ambulatory Visit (INDEPENDENT_AMBULATORY_CARE_PROVIDER_SITE_OTHER): Payer: Medicare HMO | Admitting: Urology

## 2021-12-20 ENCOUNTER — Encounter: Payer: Self-pay | Admitting: Urology

## 2021-12-20 VITALS — BP 118/67 | HR 123 | Ht 69.0 in | Wt 148.0 lb

## 2021-12-20 DIAGNOSIS — N529 Male erectile dysfunction, unspecified: Secondary | ICD-10-CM

## 2021-12-20 DIAGNOSIS — Z960 Presence of urogenital implants: Secondary | ICD-10-CM

## 2021-12-20 MED ORDER — TADALAFIL 5 MG PO TABS: 5.0000 mg | ORAL_TABLET | Freq: Every day | ORAL | 3 refills | Status: DC | PRN

## 2021-12-20 NOTE — Patient Instructions (Signed)
Erectile Dysfunction ?Erectile dysfunction (ED) is the inability to get or keep an erection in order to have sexual intercourse. ED is considered a symptom of an underlying disorder and is not considered a disease. ED may include: ?Inability to get an erection. ?Lack of enough hardness of the erection to allow penetration. ?Loss of erection before sex is finished. ?What are the causes? ?This condition may be caused by: ?Physical causes, such as: ?Artery problems. This may include heart disease, high blood pressure, atherosclerosis, and diabetes. ?Hormonal problems, such as low testosterone. ?Obesity. ?Nerve problems. This may include back or pelvic injuries, multiple sclerosis, Parkinson's disease, spinal cord injury, and stroke. ?Certain medicines, such as: ?Pain relievers. ?Antidepressants. ?Blood pressure medicines and water pills (diuretics). ?Cancer medicines. ?Antihistamines. ?Muscle relaxants. ?Lifestyle factors, such as: ?Use of drugs such as marijuana, cocaine, or opioids. ?Excessive use of alcohol. ?Smoking. ?Lack of physical activity or exercise. ?Psychological causes, such as: ?Anxiety or stress. ?Sadness or depression. ?Exhaustion. ?Fear about sexual performance. ?Guilt. ?What are the signs or symptoms? ?Symptoms of this condition include: ?Inability to get an erection. ?Lack of enough hardness of the erection to allow penetration. ?Loss of the erection before sex is finished. ?Sometimes having normal erections, but with frequent unsatisfactory episodes. ?Low sexual satisfaction in either partner due to erection problems. ?A curved penis occurring with erection. The curve may cause pain, or the penis may be too curved to allow for intercourse. ?Never having nighttime or morning erections. ?How is this diagnosed? ?This condition is often diagnosed by: ?Performing a physical exam to find other diseases or specific problems with the penis. ?Asking you detailed questions about the problem. ?Doing tests,  such as: ?Blood tests to check for diabetes mellitus or high cholesterol, or to measure hormone levels. ?Other tests to check for underlying health conditions. ?An ultrasound exam to check for scarring. ?A test to check blood flow to the penis. ?Doing a sleep study at home to measure nighttime erections. ?How is this treated? ?This condition may be treated by: ?Medicines, such as: ?Medicine taken by mouth to help you achieve an erection (oral medicine). ?Hormone replacement therapy to replace low testosterone levels. ?Medicine that is injected into the penis. Your health care provider may instruct you how to give yourself these injections at home. ?Medicine that is delivered with a short applicator tube. The tube is inserted into the opening at the tip of the penis, which is the opening of the urethra. A tiny pellet of medicine is put in the urethra. The pellet dissolves and enhances erectile function. This is also called MUSE (medicated urethral system for erections) therapy. ?Vacuum pump. This is a pump with a ring on it. The pump and ring are placed on the penis and used to create pressure that helps the penis become erect. ?Penile implant surgery. In this procedure, you may receive: ?An inflatable implant. This consists of cylinders, a pump, and a reservoir. The cylinders can be inflated with a fluid that helps to create an erection, and they can be deflated after intercourse. ?A semi-rigid implant. This consists of two silicone rubber rods. The rods provide some rigidity. They are also flexible, so the penis can both curve downward in its normal position and become straight for sexual intercourse. ?Blood vessel surgery to improve blood flow to the penis. During this procedure, a blood vessel from a different part of the body is placed into the penis to allow blood to flow around (bypass) damaged or blocked blood vessels. ?Lifestyle changes,   such as exercising more, losing weight, and quitting smoking. ?Follow  these instructions at home: ?Medicines ? ?Take over-the-counter and prescription medicines only as told by your health care provider. Do not increase the dosage without first discussing it with your health care provider. ?If you are using self-injections, do injections as directed by your health care provider. Make sure you avoid any veins that are on the surface of the penis. After giving an injection, apply pressure to the injection site for 5 minutes. ?Talk to your health care provider about how to prevent headaches while taking ED medicines. These medicines may cause a sudden headache due to the increase in blood flow in your body. ?General instructions ?Exercise regularly, as directed by your health care provider. Work with your health care provider to lose weight, if needed. ?Do not use any products that contain nicotine or tobacco. These products include cigarettes, chewing tobacco, and vaping devices, such as e-cigarettes. If you need help quitting, ask your health care provider. ?Before using a vacuum pump, read the instructions that come with the pump and discuss any questions with your health care provider. ?Keep all follow-up visits. This is important. ?Contact a health care provider if: ?You feel nauseous. ?You are vomiting. ?You get sudden headaches while taking ED medicines. ?You have any concerns about your sexual health. ?Get help right away if: ?You are taking oral or injectable medicines and you have an erection that lasts longer than 4 hours. If your health care provider is unavailable, go to the nearest emergency room for evaluation. An erection that lasts much longer than 4 hours can result in permanent damage to your penis. ?You have severe pain in your groin or abdomen. ?You develop redness or severe swelling of your penis. ?You have redness spreading at your groin or lower abdomen. ?You are unable to urinate. ?You experience chest pain or a rapid heartbeat (palpitations) after taking oral  medicines. ?These symptoms may represent a serious problem that is an emergency. Do not wait to see if the symptoms will go away. Get medical help right away. Call your local emergency services (911 in the [place] Do not drive yourself to the hospital. ?Summary ?Erectile dysfunction (ED) is the inability to get or keep an erection during sexual intercourse. ?This condition is diagnosed based on a physical exam, your symptoms, and tests to determine the cause. Treatment varies depending on the cause and may include medicines, hormone therapy, surgery, or a vacuum pump. ?You may need follow-up visits to make sure that you are using your medicines or devices correctly. ?Get help right away if you are taking or injecting medicines and you have an erection that lasts longer than 4 hours. ?This information is not intended to replace advice given to you by your health care provider. Make sure you discuss any questions you have with your health care provider. ?Document Revised: 10/21/2020 Document Reviewed: 10/21/2020 ?Elsevier Patient Education ? 2023 [employer] ? ?

## 2021-12-20 NOTE — Progress Notes (Signed)
12/20/2021 11:15 AM   Rolan Lipa [place] 1961/06/29 [id number]  Referring provider: Carrolyn Meiers, MD [place],  [place] [address]  Erectile dysfunction   HPI: Mr Monger is a 61yo here for evaluation of erectile dysfunction. He has been on sildenafil for 5 years which intermittently works. Normal libido. He is interested in an IPP. He does not want to continue to take pills. He issues getting a firm erection and he cannot maintain his erection. He has a 30pk year smoking history. He recently finished chemotherapy for lung cancer.    PMH: Past Medical History:  Diagnosis Date   Cancer ([place])    Lung cancer   COPD (chronic obstructive pulmonary disease) ([place]) 2007   EMPHYSEMA   Depression    Dyspnea    occasional - no oxygen   GERD (gastroesophageal reflux disease) 2010   Hepatitis    Alcoholic    HLD (hyperlipidemia)    no meds   Hypertension    Port-A-Cath in place 01/01/2021    Surgical History: Past Surgical History:  Procedure Laterality Date   BRONCHIAL BIOPSY  12/23/2020   Procedure: BRONCHIAL BIOPSIES;  Surgeon: [name], MD;  Location: [facility];  Service: Pulmonary;;   BRONCHIAL BRUSHINGS  12/23/2020   Procedure: BRONCHIAL BRUSHINGS;  Surgeon: [name], MD;  Location: [facility];  Service: Pulmonary;;   BRONCHIAL NEEDLE ASPIRATION BIOPSY  12/23/2020   Procedure: BRONCHIAL NEEDLE ASPIRATION BIOPSIES;  Surgeon: [name], MD;  Location: [facility];  Service: Pulmonary;;   BRONCHIAL WASHINGS  12/23/2020   Procedure: BRONCHIAL WASHINGS;  Surgeon: [name], MD;  Location: [facility] ENDOSCOPY;  Service: Pulmonary;;   CARDIAC CATHETERIZATION  2010   COLONOSCOPY  03/14/2012   Procedure: COLONOSCOPY;  Surgeon: Rogene Houston, MD;  [facility];  Service: Endoscopy;  Laterality: N/A;  100   INTERCOSTAL NERVE BLOCK  04/21/2021   Procedure: INTERCOSTAL NERVE BLOCK;  Surgeon: Melrose Nakayama, MD;   Location: [place];  Service: Thoracic;;   IR IMAGING GUIDED PORT INSERTION  01/12/2021   VIDEO ASSISTED THORACOSCOPY (VATS)/ LOBECTOMY Left 04/21/2021   Procedure: VIDEO ASSISTED THORACOSCOPY (VATS)/ LOBECTOMY;  Surgeon: Melrose Nakayama, MD;  Location: [place];  Service: Thoracic;  Laterality: Left;   VIDEO BRONCHOSCOPY WITH ENDOBRONCHIAL NAVIGATION N/A 12/23/2020   Procedure: VIDEO BRONCHOSCOPY WITH ENDOBRONCHIAL NAVIGATION;  Surgeon: [name], MD;  Location: [place] [facility] ENDOSCOPY;  Service: Pulmonary;  Laterality: N/A;   WRIST GANGLION EXCISION  1985    Home Medications:  Allergies as of 12/20/2021       Reactions   Other Nausea Only, Other (See Comments)   No spicy foods- Patient cannot tolerate- HEARTBURN        Medication List        Accurate as of Dec 20, 2021 11:15 AM. If you have any questions, ask your nurse or doctor.          acetaminophen 500 MG tablet Commonly known as: TYLENOL Take 2 tablets (1,000 mg total) by mouth every 6 (six) hours as needed.   amLODipine 5 MG tablet Commonly known as: NORVASC Take 5 mg by mouth daily.   aspirin 81 MG tablet Take 81 mg by mouth daily.   CARBOPLATIN IV Inject into the vein every 21 ( twenty-one) days.   gabapentin 300 MG capsule Commonly known as: NEURONTIN Take 1 capsule (300 mg total) by mouth 3 (three) times daily.   lidocaine-prilocaine cream Commonly known as:  EMLA Apply small amount to port a cath site and cover with plastic wrap 1 hour prior to infusion appointments   metoprolol tartrate 25 MG tablet Commonly known as: LOPRESSOR Take 25 mg by mouth 2 (two) times daily.   multivitamin with minerals Tabs tablet Take 1 tablet by mouth daily.   naloxone 4 MG/0.1ML Liqd nasal spray kit Commonly known as: NARCAN Spray 0.1 mL (contents of one device) into one nostril upon signs of opioid overdose.  Call 911.  May repeat once if no response within 2-3 minutes.   NIVOLUMAB IV Inject 360 mg into the vein every  21 ( twenty-one) days.   omeprazole 40 MG capsule Commonly known as: PRILOSEC Take 40 mg by mouth daily.   Oxycodone HCl 10 MG Tabs Take 1 tablet (10 mg total) by mouth 2 (two) times daily as needed.   PACLITAXEL IV Inject 200 mg/m2 into the vein every 21 ( twenty-one) days.   prochlorperazine 10 MG tablet Commonly known as: COMPAZINE TAKE 1 TABLET BY MOUTH EVERY 6 HOURS AS NEEDED FOR NAUSEA FOR VOMITING   Proventil HFA 108 (90 Base) MCG/ACT inhaler Generic drug: albuterol INHALE 2 PUFFS BY MOUTH EVERY 6 HOURS AS NEEDED FOR COUGHING, WHEEZING, OR SHORTNESS OF BREATH   sildenafil 50 MG tablet Commonly known as: VIAGRA Take 50 mg by mouth daily as needed.   Trelegy Ellipta 100-62.5-25 MCG/ACT Aepb Generic drug: Fluticasone-Umeclidin-Vilant Inhale 1 puff into the lungs daily in the afternoon.        Allergies:  Allergies  Allergen Reactions   Other Nausea Only and Other (See Comments)    No spicy foods- Patient cannot tolerate- HEARTBURN    Family History: Family History  Problem Relation Age of Onset   Hypertension Mother    Asthma Father    Diabetes Sister    Hypertension Sister    Hypertension Brother    Hypertension Sister    Lupus Daughter     Social History:  reports that he has been smoking cigarettes. He has a 37.00 pack-year smoking history. He has never used smokeless tobacco. He reports current alcohol use of about 56.0 standard drinks per week. He reports that he does not use drugs.  ROS: All other review of systems were reviewed and are negative except what is noted above in HPI  Physical Exam: BP 118/67   Pulse (!) 123   Ht _0  (1.753 m)   Wt 148 lb (67.1 kg)   BMI 21.86 kg/m   Constitutional:  Alert and oriented, No acute distress. HEENT: [place] AT, moist mucus membranes.  Trachea midline, no masses. Cardiovascular: No clubbing, cyanosis, or edema. Respiratory: Normal respiratory effort, no increased work of breathing. GI: Abdomen is soft,  nontender, nondistended, no abdominal masses GU: No CVA tenderness.  Lymph: No cervical or inguinal lymphadenopathy. Skin: No rashes, bruises or suspicious lesions. Neurologic: Grossly intact, no focal deficits, moving all 4 extremities. Psychiatric: Normal mood and affect.  Laboratory Data: Lab Results  Component Value Date   WBC 8.8 06/08/2021   HGB 13.5 06/08/2021   HCT 41.3 06/08/2021   MCV 96.3 06/08/2021   PLT 319 06/08/2021    Lab Results  Component Value Date   CREATININE 0.70 07/29/2021    Lab Results  Component Value Date   PSA 0.82 08/20/2015    No results found for: TESTOSTERONE  Lab Results  Component Value Date   HGBA1C 5.5 06/04/2020    Urinalysis    Component Value Date/Time  COLORURINE YELLOW 04/19/2021 1510   APPEARANCEUR HAZY (A) 04/19/2021 1510   LABSPEC 1.012 04/19/2021 1510   PHURINE 5.0 04/19/2021 1510   GLUCOSEU NEGATIVE 04/19/2021 1510   HGBUR NEGATIVE 04/19/2021 1510   BILIRUBINUR NEGATIVE 04/19/2021 1510   KETONESUR NEGATIVE 04/19/2021 1510   PROTEINUR NEGATIVE 04/19/2021 1510   UROBILINOGEN 0.2 10/28/2007 1435   NITRITE NEGATIVE 04/19/2021 [place] 04/19/2021 1510    Lab Results  Component Value Date   BACTERIA NONE SEEN 06/04/2020    Pertinent Imaging:  No results found for this or any previous visit.  No results found for this or any previous visit.  No results found for this or any previous visit.  No results found for this or any previous visit.  No results found for this or any previous visit.  No results found for this or any previous visit.  No results found for this or any previous visit.  No results found for this or any previous visit.   Assessment & Plan:    1. Erectile dysfunction -We will trial tadalafil 63m daily   No follow-ups on file.  PNicolette Bang MD  [facility] [place]

## 2021-12-21 DIAGNOSIS — C3492 Malignant neoplasm of unspecified part of left bronchus or lung: Secondary | ICD-10-CM | POA: Diagnosis not present

## 2021-12-21 DIAGNOSIS — I10 Essential (primary) hypertension: Secondary | ICD-10-CM | POA: Diagnosis not present

## 2021-12-31 ENCOUNTER — Other Ambulatory Visit (HOSPITAL_COMMUNITY): Payer: Self-pay | Admitting: Hematology

## 2021-12-31 DIAGNOSIS — C3492 Malignant neoplasm of unspecified part of left bronchus or lung: Secondary | ICD-10-CM

## 2021-12-31 MED ORDER — OXYCODONE HCL 10 MG PO TABS: 10.0000 mg | ORAL_TABLET | Freq: Two times a day (BID) | ORAL | 0 refills | Status: DC | PRN

## 2022-01-15 ENCOUNTER — Other Ambulatory Visit: Payer: Self-pay | Admitting: Nurse Practitioner

## 2022-01-21 DIAGNOSIS — I10 Essential (primary) hypertension: Secondary | ICD-10-CM | POA: Diagnosis not present

## 2022-01-21 DIAGNOSIS — C3492 Malignant neoplasm of unspecified part of left bronchus or lung: Secondary | ICD-10-CM | POA: Diagnosis not present

## 2022-01-24 ENCOUNTER — Encounter: Payer: Self-pay | Admitting: Urology

## 2022-01-24 ENCOUNTER — Ambulatory Visit (INDEPENDENT_AMBULATORY_CARE_PROVIDER_SITE_OTHER): Payer: Medicare HMO | Admitting: Urology

## 2022-01-24 VITALS — BP 106/70 | HR 95

## 2022-01-24 DIAGNOSIS — Z960 Presence of urogenital implants: Secondary | ICD-10-CM

## 2022-01-24 DIAGNOSIS — N5201 Erectile dysfunction due to arterial insufficiency: Secondary | ICD-10-CM

## 2022-01-24 NOTE — H&P (View-Only) (Signed)
01/24/2022 3:16 PM   Bryan [place] 08-Feb-1961 [id number]  Referring provider: Carrolyn Meiers, MD [place],  [place] [address]  Erectile dysfunction   HPI: [name] is a 61yo here for followup for erectile dysfunction. He has tried sildenafil and tadalafil which failed to give him a firm erection. He is interested in a penile prosthesis. No other complaints today    PMH: Past Medical History:  Diagnosis Date   Cancer ([place])    Lung cancer   COPD (chronic obstructive pulmonary disease) ([place]) 2007   EMPHYSEMA   Depression    Dyspnea    occasional - no oxygen   GERD (gastroesophageal reflux disease) 2010   Hepatitis    Alcoholic    HLD (hyperlipidemia)    no meds   Hypertension    Port-A-Cath in place 01/01/2021    Surgical History: Past Surgical History:  Procedure Laterality Date   BRONCHIAL BIOPSY  12/23/2020   Procedure: BRONCHIAL BIOPSIES;  Surgeon: [name], MD;  Location: [facility];  Service: Pulmonary;;   BRONCHIAL BRUSHINGS  12/23/2020   Procedure: BRONCHIAL BRUSHINGS;  Surgeon: [name], MD;  Location: [place];  Service: Pulmonary;;   BRONCHIAL NEEDLE ASPIRATION BIOPSY  12/23/2020   Procedure: BRONCHIAL NEEDLE ASPIRATION BIOPSIES;  Surgeon: [name], MD;  Location: [place];  Service: Pulmonary;;   BRONCHIAL WASHINGS  12/23/2020   Procedure: BRONCHIAL WASHINGS;  Surgeon: [name], MD;  Location: [facility] ENDOSCOPY;  Service: Pulmonary;;   CARDIAC CATHETERIZATION  2010   COLONOSCOPY  03/14/2012   Procedure: COLONOSCOPY;  Surgeon: Rogene Houston, MD;  [facility];  Service: Endoscopy;  Laterality: N/A;  100   INTERCOSTAL NERVE BLOCK  04/21/2021   Procedure: INTERCOSTAL NERVE BLOCK;  Surgeon: Melrose Nakayama, MD;  Location: [place];  Service: Thoracic;;   IR IMAGING GUIDED PORT INSERTION  01/12/2021   VIDEO ASSISTED THORACOSCOPY (VATS)/ LOBECTOMY Left 04/21/2021   Procedure:  VIDEO ASSISTED THORACOSCOPY (VATS)/ LOBECTOMY;  Surgeon: Melrose Nakayama, MD;  Location: [place];  Service: Thoracic;  Laterality: Left;   VIDEO BRONCHOSCOPY WITH ENDOBRONCHIAL NAVIGATION N/A 12/23/2020   Procedure: VIDEO BRONCHOSCOPY WITH ENDOBRONCHIAL NAVIGATION;  Surgeon: [name], MD;  Location: [place] ENDOSCOPY;  Service: Pulmonary;  Laterality: N/A;   WRIST GANGLION EXCISION  1985    Home Medications:  Allergies as of 01/24/2022       Reactions   Other Nausea Only, Other (See Comments)   No spicy foods- Patient cannot tolerate- HEARTBURN        Medication List        Accurate as of January 24, 2022  3:16 PM. If you have any questions, ask your nurse or doctor.          acetaminophen 500 MG tablet Commonly known as: TYLENOL Take 2 tablets (1,000 mg total) by mouth every 6 (six) hours as needed.   amLODipine 5 MG tablet Commonly known as: NORVASC Take 5 mg by mouth daily.   aspirin 81 MG tablet Take 81 mg by mouth daily.   CARBOPLATIN IV Inject into the vein every 21 ( twenty-one) days.   gabapentin 300 MG capsule Commonly known as: NEURONTIN Take 1 capsule (300 mg total) by mouth 3 (three) times daily.   metoprolol tartrate 25 MG tablet Commonly known as: LOPRESSOR Take 25 mg by mouth 2 (two) times daily.   multivitamin with minerals Tabs tablet Take 1 tablet by mouth daily.  naloxone 4 MG/0.1ML Liqd nasal spray kit Commonly known as: NARCAN Spray 0.1 mL (contents of one device) into one nostril upon signs of opioid overdose.  Call 911.  May repeat once if no response within 2-3 minutes.   NIVOLUMAB IV Inject 360 mg into the vein every 21 ( twenty-one) days.   omeprazole 40 MG capsule Commonly known as: PRILOSEC Take 40 mg by mouth daily.   Oxycodone HCl 10 MG Tabs Take 1 tablet (10 mg total) by mouth 2 (two) times daily as needed.   PACLITAXEL IV Inject 200 mg/m2 into the vein every 21 ( twenty-one) days.   Proventil HFA 108 (90 Base)  MCG/ACT inhaler Generic drug: albuterol INHALE 2 PUFFS BY MOUTH EVERY 6 HOURS AS NEEDED FOR COUGHING, WHEEZING, OR SHORTNESS OF BREATH   sildenafil 50 MG tablet Commonly known as: VIAGRA Take 50 mg by mouth daily as needed.   tadalafil 5 MG tablet Commonly known as: CIALIS Take 1 tablet (5 mg total) by mouth daily as needed for erectile dysfunction.   Trelegy Ellipta 100-62.5-25 MCG/ACT Aepb Generic drug: Fluticasone-Umeclidin-Vilant Inhale 1 puff into the lungs daily in the afternoon.        Allergies:  Allergies  Allergen Reactions   Other Nausea Only and Other (See Comments)    No spicy foods- Patient cannot tolerate- HEARTBURN    Family History: Family History  Problem Relation Age of Onset   Hypertension Mother    Asthma Father    Diabetes Sister    Hypertension Sister    Hypertension Brother    Hypertension Sister    Lupus Daughter     Social History:  reports that he has been smoking cigarettes. He has a 37.00 pack-year smoking history. He has never used smokeless tobacco. He reports current alcohol use of about 56.0 standard drinks of alcohol per week. He reports that he does not use drugs.  ROS: All other review of systems were reviewed and are negative except what is noted above in HPI  Physical Exam: BP 106/70   Pulse 95   Constitutional:  Alert and oriented, No acute distress. HEENT: [place] AT, moist mucus membranes.  Trachea midline, no masses. Cardiovascular: No clubbing, cyanosis, or edema. Respiratory: Normal respiratory effort, no increased work of breathing. GI: Abdomen is soft, nontender, nondistended, no abdominal masses GU: No CVA tenderness.  Lymph: No cervical or inguinal lymphadenopathy. Skin: No rashes, bruises or suspicious lesions. Neurologic: Grossly intact, no focal deficits, moving all 4 extremities. Psychiatric: Normal mood and affect.  Laboratory Data: Lab Results  Component Value Date   WBC 8.8 06/08/2021   HGB 13.5 06/08/2021    HCT 41.3 06/08/2021   MCV 96.3 06/08/2021   PLT 319 06/08/2021    Lab Results  Component Value Date   CREATININE 0.70 07/29/2021    Lab Results  Component Value Date   PSA 0.82 08/20/2015    No results found for: "TESTOSTERONE"  Lab Results  Component Value Date   HGBA1C 5.5 06/04/2020    Urinalysis    Component Value Date/Time   COLORURINE YELLOW 04/19/2021 1510   APPEARANCEUR HAZY (A) 04/19/2021 1510   LABSPEC 1.012 04/19/2021 1510   PHURINE 5.0 04/19/2021 1510   GLUCOSEU NEGATIVE 04/19/2021 1510   HGBUR NEGATIVE 04/19/2021 1510   BILIRUBINUR NEGATIVE 04/19/2021 1510   KETONESUR NEGATIVE 04/19/2021 1510   PROTEINUR NEGATIVE 04/19/2021 1510   UROBILINOGEN 0.2 10/28/2007 1435   NITRITE NEGATIVE 04/19/2021 1510   LEUKOCYTESUR NEGATIVE 04/19/2021 1510      Lab Results  Component Value Date   BACTERIA NONE SEEN 06/04/2020    Pertinent Imaging:  No results found for this or any previous visit.  No results found for this or any previous visit.  No results found for this or any previous visit.  No results found for this or any previous visit.  No results found for this or any previous visit.  No results found for this or any previous visit.  No results found for this or any previous visit.  No results found for this or any previous visit.   Assessment & Plan:    1. Erectile dysfunction -We discussed the treatment options for erectile dysfunction including medical therapy, ICI, VED, MUSe and IPP. After discussing the options the patient elects for IPP placement. Risks/benefits/alternatives discussed. I showed the patient his stretched penile length which will correlate with his penile length with the IPP in place. He was satisfied with t stretched penile length   No follow-ups on file.  Jotham Ahn, MD  [facility] [place]   

## 2022-01-24 NOTE — Patient Instructions (Signed)
Penile Prosthesis Implantation Penile prosthesis implantation is a procedure to place a device into the penis. The device is used to treat problems with having or keeping an erection (erectile dysfunction). There are two main types of devices that can be put in during the procedure: malleable semi-rigid penile implants and inflatable (hydraulic) penile implants. Malleable semi-rigid penile implant A malleable semi-rigid penile implant, also called a non-hydraulic penile implant, consists of two silicone rubber rods. The rods provide some hardness (rigidity). They are also flexible, so the penis can curve downward in its normal position and become straight for sex. Inflatable penile implant  An inflatable penile implant, also called a hydraulic penile implant, consists of two- or three-piece inflatable devices. These devices include one or two cylinders in the shaft of the penis, a pump located in the scrotum, and a part that stores water in the abdomen (abdominal reservoir). The cylinders are inflated with normal salt water (saline) that helps to create an erection, and they can be deflated after sex. There are several types of inflatable implants. Tell a health care provider about: Any allergies you have. All medicines you are taking, including vitamins, herbs, eye drops, creams, and over-the-counter medicines. Any problems you or family members have had with anesthetic medicines. Any bleeding problems you have. Any surgeries you have had. Any medical conditions you have. What are the risks? Generally, this is a safe procedure. However, problems may occur, including: Infection in the penis. If this happens, the implant may need to be removed. Allergic reactions to medicines. Damage to nearby structures or organs, such as to the urethra. This is the part of the body that drains urine from the bladder. Not enough blood reaching the penis. This is rare. If this happens, the implant will need to be  removed. What happens before the procedure? When to stop eating and drinking Follow instructions from your health care provider about what you may eat and drink before your procedure. These may include: 8 hours before your procedure Stop eating most foods. Do not eat meat, fried foods, or fatty foods. Eat only light foods, such as toast or crackers. All liquids are okay except energy drinks and alcohol. 6 hours before your procedure Stop eating. Drink only clear liquids, such as water, clear fruit juice, black coffee, plain tea, and sports drinks. Do not drink energy drinks or alcohol. 2 hours before your procedure Stop drinking all liquids. You may be allowed to take medicines with small sips of water. If you do not follow your health care provider's instructions, your procedure may be delayed or canceled. Medicines Ask your health care provider about: Changing or stopping your regular medicines. This is especially important if you are taking diabetes medicines or blood thinners. Taking medicines such as aspirin and ibuprofen. These medicines can thin your blood. Do not take these medicines unless your health care provider tells you to take them. Taking over-the-counter medicines, vitamins, herbs, and supplements. General instructions Do not use any products that contain nicotine or tobacco for at least 4 weeks before the procedure. These products include cigarettes, chewing tobacco, and vaping devices, such as e-cigarettes. If you need help quitting, ask your health care provider. If you will be going home right after the procedure, plan to have a responsible adult: Take you home from the hospital or clinic. You will not be allowed to drive. Care for you for the time you are told. Ask your health care provider: How your surgery site will be marked. What  steps will be taken to help prevent infection. These steps may include: Removing hair at the surgery site. Washing skin with a  germ-killing soap. Taking antibiotic medicine. What happens during the procedure? An IV will be inserted into one of your veins. You will be given one or more of the following: A medicine to help you relax (sedative). A medicine to make you fall asleep (general anesthetic). A medicine that is injected into your spine to numb the area below and slightly above the injection site (spinal anesthetic). A small, thin tube called a catheter may be inserted through the tip of your penis and into your urethra and bladder. The catheter drains urine during the procedure and helps your surgeon easily locate your urethra. A small incision will be made in your scrotum or in your penis, just below the head of your penis. The rods or cylinders of the prosthesis will be placed into the shaft of your penis. For an inflatable penile implant: Incisions will also be made in your abdomen and scrotum. The reservoir and pump will be inserted in these incisions. The cylinders, reservoir, and pump will be joined by tubes and tested to ensure the fluid flows from one area to the other. Your incision will be closed with dissolvable stitches (sutures). Skin glue or adhesive strips may also be used to close your incision. A bandage (dressing) will be applied to your incision. You may be fitted with a device similar to a jock strap or underwear with a supportive pouch (scrotal support) to relieve pressure on the incision area. The procedure may vary among health care providers and hospitals. What happens after the procedure? Your blood pressure, heart rate, breathing rate, and blood oxygen level will be monitored until you leave the hospital or clinic. If you have a catheter in place, it may stay in place until the day after the procedure. You may be given antibiotics and pain medicines as needed. A towel roll or an ice pack may be placed under your scrotum to help reduce swelling. If you were given a sedative during the  procedure, it can affect you for several hours. Do not drive or operate machinery until your health care provider says that it is safe. Summary Penile prosthesis implantation is a procedure to place a device into the penis. The device is used to treat problems with having or keeping an erection (erectile dysfunction). There are two main types of penile implants that can be put in during this procedure: malleable semi-rigid types and inflatable types. After the procedure, you may be fitted with a device similar to a jock strap or underwear with a supportive pouch (scrotal support) to relieve pressure on the incision area. This information is not intended to replace advice given to you by your health care provider. Make sure you discuss any questions you have with your health care provider. Document Revised: 06/23/2021 Document Reviewed: 06/23/2021 Elsevier Patient Education  [employer].

## 2022-01-24 NOTE — Progress Notes (Signed)
01/24/2022 3:16 PM   Bryan Fleming Bryan Fleming 08-Feb-1961 [id number]  Referring provider: Carrolyn Meiers, MD [place],  [place] [address]  Erectile dysfunction   HPI: [name] is a 61yo here for followup for erectile dysfunction. He has tried sildenafil and tadalafil which failed to give him a firm erection. He is interested in a penile prosthesis. No other complaints today    PMH: Past Medical History:  Diagnosis Date   Cancer ([place])    Lung cancer   COPD (chronic obstructive pulmonary disease) ([place]) 2007   EMPHYSEMA   Depression    Dyspnea    occasional - no oxygen   GERD (gastroesophageal reflux disease) 2010   Hepatitis    Alcoholic    HLD (hyperlipidemia)    no meds   Hypertension    Port-A-Cath in place 01/01/2021    Surgical History: Past Surgical History:  Procedure Laterality Date   BRONCHIAL BIOPSY  12/23/2020   Procedure: BRONCHIAL BIOPSIES;  Surgeon: [name], MD;  Location: [place];  Service: Pulmonary;;   BRONCHIAL BRUSHINGS  12/23/2020   Procedure: BRONCHIAL BRUSHINGS;  Surgeon: [name], MD;  Location: [place];  Service: Pulmonary;;   BRONCHIAL NEEDLE ASPIRATION BIOPSY  12/23/2020   Procedure: BRONCHIAL NEEDLE ASPIRATION BIOPSIES;  Surgeon: [name], MD;  Location: [place];  Service: Pulmonary;;   BRONCHIAL WASHINGS  12/23/2020   Procedure: BRONCHIAL WASHINGS;  Surgeon: [name], MD;  Location: [facility] ENDOSCOPY;  Service: Pulmonary;;   CARDIAC CATHETERIZATION  2010   COLONOSCOPY  03/14/2012   Procedure: COLONOSCOPY;  Surgeon: Rogene Houston, MD;  [facility];  Service: Endoscopy;  Laterality: N/A;  100   INTERCOSTAL NERVE BLOCK  04/21/2021   Procedure: INTERCOSTAL NERVE BLOCK;  Surgeon: Melrose Nakayama, MD;  Location: [place];  Service: Thoracic;;   IR IMAGING GUIDED PORT INSERTION  01/12/2021   VIDEO ASSISTED THORACOSCOPY (VATS)/ LOBECTOMY Left 04/21/2021   Procedure:  VIDEO ASSISTED THORACOSCOPY (VATS)/ LOBECTOMY;  Surgeon: Melrose Nakayama, MD;  Location: [place];  Service: Thoracic;  Laterality: Left;   VIDEO BRONCHOSCOPY WITH ENDOBRONCHIAL NAVIGATION N/A 12/23/2020   Procedure: VIDEO BRONCHOSCOPY WITH ENDOBRONCHIAL NAVIGATION;  Surgeon: [name], MD;  Location: [place] ENDOSCOPY;  Service: Pulmonary;  Laterality: N/A;   WRIST GANGLION EXCISION  1985    Home Medications:  Allergies as of 01/24/2022       Reactions   Other Nausea Only, Other (See Comments)   No spicy foods- Patient cannot tolerate- HEARTBURN        Medication List        Accurate as of January 24, 2022  3:16 PM. If you have any questions, ask your nurse or doctor.          acetaminophen 500 MG tablet Commonly known as: TYLENOL Take 2 tablets (1,000 mg total) by mouth every 6 (six) hours as needed.   amLODipine 5 MG tablet Commonly known as: NORVASC Take 5 mg by mouth daily.   aspirin 81 MG tablet Take 81 mg by mouth daily.   CARBOPLATIN IV Inject into the vein every 21 ( twenty-one) days.   gabapentin 300 MG capsule Commonly known as: NEURONTIN Take 1 capsule (300 mg total) by mouth 3 (three) times daily.   metoprolol tartrate 25 MG tablet Commonly known as: LOPRESSOR Take 25 mg by mouth 2 (two) times daily.   multivitamin with minerals Tabs tablet Take 1 tablet by mouth daily.  naloxone 4 MG/0.1ML Liqd nasal spray kit Commonly known as: NARCAN Spray 0.1 mL (contents of one device) into one nostril upon signs of opioid overdose.  Call 911.  May repeat once if no response within 2-3 minutes.   NIVOLUMAB IV Inject 360 mg into the vein every 21 ( twenty-one) days.   omeprazole 40 MG capsule Commonly known as: PRILOSEC Take 40 mg by mouth daily.   Oxycodone HCl 10 MG Tabs Take 1 tablet (10 mg total) by mouth 2 (two) times daily as needed.   PACLITAXEL IV Inject 200 mg/m2 into the vein every 21 ( twenty-one) days.   Proventil HFA 108 (90 Base)  MCG/ACT inhaler Generic drug: albuterol INHALE 2 PUFFS BY MOUTH EVERY 6 HOURS AS NEEDED FOR COUGHING, WHEEZING, OR SHORTNESS OF BREATH   sildenafil 50 MG tablet Commonly known as: VIAGRA Take 50 mg by mouth daily as needed.   tadalafil 5 MG tablet Commonly known as: CIALIS Take 1 tablet (5 mg total) by mouth daily as needed for erectile dysfunction.   Trelegy Ellipta 100-62.5-25 MCG/ACT Aepb Generic drug: Fluticasone-Umeclidin-Vilant Inhale 1 puff into the lungs daily in the afternoon.        Allergies:  Allergies  Allergen Reactions   Other Nausea Only and Other (See Comments)    No spicy foods- Patient cannot tolerate- HEARTBURN    Family History: Family History  Problem Relation Age of Onset   Hypertension Mother    Asthma Father    Diabetes Sister    Hypertension Sister    Hypertension Brother    Hypertension Sister    Lupus Daughter     Social History:  reports that he has been smoking cigarettes. He has a 37.00 pack-year smoking history. He has never used smokeless tobacco. He reports current alcohol use of about 56.0 standard drinks of alcohol per week. He reports that he does not use drugs.  ROS: All other review of systems were reviewed and are negative except what is noted above in HPI  Physical Exam: BP 106/70   Pulse 95   Constitutional:  Alert and oriented, No acute distress. HEENT: [place] AT, moist mucus membranes.  Trachea midline, no masses. Cardiovascular: No clubbing, cyanosis, or edema. Respiratory: Normal respiratory effort, no increased work of breathing. GI: Abdomen is soft, nontender, nondistended, no abdominal masses GU: No CVA tenderness.  Lymph: No cervical or inguinal lymphadenopathy. Skin: No rashes, bruises or suspicious lesions. Neurologic: Grossly intact, no focal deficits, moving all 4 extremities. Psychiatric: Normal mood and affect.  Laboratory Data: Lab Results  Component Value Date   WBC 8.8 06/08/2021   HGB 13.5 06/08/2021    HCT 41.3 06/08/2021   MCV 96.3 06/08/2021   PLT 319 06/08/2021    Lab Results  Component Value Date   CREATININE 0.70 07/29/2021    Lab Results  Component Value Date   PSA 0.82 08/20/2015    No results found for: "TESTOSTERONE"  Lab Results  Component Value Date   HGBA1C 5.5 06/04/2020    Urinalysis    Component Value Date/Time   COLORURINE YELLOW 04/19/2021 1510   APPEARANCEUR HAZY (A) 04/19/2021 1510   LABSPEC 1.012 04/19/2021 1510   PHURINE 5.0 04/19/2021 1510   GLUCOSEU NEGATIVE 04/19/2021 1510   HGBUR NEGATIVE 04/19/2021 1510   BILIRUBINUR NEGATIVE 04/19/2021 1510   KETONESUR NEGATIVE 04/19/2021 1510   PROTEINUR NEGATIVE 04/19/2021 1510   UROBILINOGEN 0.2 10/28/2007 1435   NITRITE NEGATIVE 04/19/2021 1510   LEUKOCYTESUR NEGATIVE 04/19/2021 1510  Lab Results  Component Value Date   BACTERIA NONE SEEN 06/04/2020    Pertinent Imaging:  No results found for this or any previous visit.  No results found for this or any previous visit.  No results found for this or any previous visit.  No results found for this or any previous visit.  No results found for this or any previous visit.  No results found for this or any previous visit.  No results found for this or any previous visit.  No results found for this or any previous visit.   Assessment & Plan:    1. Erectile dysfunction -We discussed the treatment options for erectile dysfunction including medical therapy, ICI, VED, MUSe and IPP. After discussing the options the patient elects for IPP placement. Risks/benefits/alternatives discussed. I showed the patient his stretched penile length which will correlate with his penile length with the IPP in place. He was satisfied with t stretched penile length   No follow-ups on file.  Nicolette Bang, MD  [facility] [place]

## 2022-01-27 NOTE — Patient Instructions (Signed)
Bryan Fleming  01/27/2022     @PREFPERIOPPHARMACY @   Your procedure is scheduled on  02/07/2022.   Report to [facility] at  0800  A.M.   Call this number if you have problems the morning of surgery:  [phone]   Remember:  Do not eat or drink after midnight.      Use your inhalers before you come and bring your rescue inhaler with you.     Take these medicines the morning of surgery with A SIP OF WATER          Norvasc, neurontin, oxycodone(if needed).     Do not wear jewelry, make-up or nail polish.  Do not wear lotions, powders, or perfumes, or deodorant.  Do not shave 48 hours prior to surgery.  Men may shave face and neck.  Do not bring valuables to the hospital.  [facility] is not responsible for any belongings or valuables.  Contacts, dentures or bridgework may not be worn into surgery.  Leave your suitcase in the car.  After surgery it may be brought to your room.  For patients admitted to the hospital, discharge time will be determined by your treatment team.  Patients discharged the day of surgery will not be allowed to drive home and must have someone with them for 24 hours.    Special instructions:   DO NOT smoke tobacco or vape for 24 hours before your procedure.  Please read over the following fact sheets that you were given. Anesthesia Post-op Instructions and Care and Recovery After Surgery      Penile Prosthesis Implantation, Care After The following information offers guidance on how to care for yourself after your procedure. Your health care provider may also give you more specific instructions. If you have problems or questions, contact your health care provider. What can I expect after the procedure? After the procedure, it is common to have: Pain and discomfort at the areas around your incision or incisions. Some swelling of the scrotum or penis. Follow these instructions at home: Medicines Take over-the-counter and  prescription medicines only as told by your health care provider. If you were prescribed an antibiotic medicine, take it as told by your health care provider. Do not stop using the antibiotic even if you start to feel better. Ask your health care provider if the medicine prescribed to you: Requires you to avoid driving or using machinery. Can cause constipation. You may need to take these actions to prevent or treat constipation: Drink enough fluid to keep your urine pale yellow. Take over-the-counter or prescription medicines. Eat foods that are high in fiber, such as beans, whole grains, and fresh fruits and vegetables. Limit foods that are high in fat and processed sugars, such as fried or sweet foods. Incision care  Follow instructions from your health care provider about how to take care of your incision or incisions. Make sure you: Wash your hands with soap and water for at least 20 seconds before and after you change your bandage (dressing). If soap and water are not available, use hand sanitizer. Change your dressing as told by your health care provider. Leave stitches (sutures), skin glue, or adhesive strips in place. These skin closures may need to stay in place for 2 weeks or longer. If adhesive strip edges start to loosen and curl up, you may trim the loose edges. Do not remove adhesive strips completely unless your health care provider tells you to do  that. Check your incision area every day for signs of infection. Check for: More redness, swelling, or pain. Fluid or blood. Warmth. Pus or a bad smell. Managing pain and swelling If directed, put ice on the affected area. To do this: Put ice in a plastic bag. Place a towel between your skin and the bag. Leave the ice on for 20 minutes, 2-3 times a day. Remove the ice if your skin turns bright red. This is very important. If you cannot feel pain, heat, or cold, you have a greater risk of damage to the area. Activity If you were  given a sedative during the procedure, it can affect you for several hours. Do not drive or operate machinery until your health care provider says that it is safe. Get up to take short walks every 1-2 hours. This is important to improve blood flow and breathing. Ask for help if you feel weak or unsteady. Lie flat instead of sitting when you are resting for the first few days. This is to avoid too much pressure on the scrotum. You may have to avoid lifting. Ask your health care provider how much you can safely lift. Follow instructions from your health care provider about when it is safe for you to engage in sexual activity. Your health care provider will tell you when you may start using your penile implant, which is usually 4-6 weeks after surgery. Return to your normal activities as told by your health care provider. Ask your health care provider what activities are safe for you. General instructions Wear a device similar to a jock strap or underwear with a supportive pouch (scrotal support) as told by your health care provider. If your scrotal support irritates your incision area, you may remove the support. Do not take baths, swim, or use a hot tub until your health care provider approves. Ask your health care provider if you may take showers. You may only be allowed to take sponge baths. Do not use any products that contain nicotine or tobacco. These products include cigarettes, chewing tobacco, and vaping devices, such as e-cigarettes. These can delay incision healing after surgery. If you need help quitting, ask your health care provider. Keep all follow-up visits. This is important. Contact a health care provider if: The implant inflates on its own. The implant does not work or it stops working. Your penis becomes curved or it changes shape in some other way. Get help right away if: You have more redness, swelling, or pain around your incision area or in your scrotum. You have fluid or blood  coming from your incision area. Your incision area feels warm to the touch. You have pus or a bad smell coming from your incision area. You have pain in your penis that gets worse. The skin on your penis becomes dark or discolored. Summary Check your incision area every day for signs of infection, such as more redness, swelling, or pain. Get up to take short walks every 1-2 hours. This is important to improve blood flow and breathing. Ask for help if you feel weak or unsteady. Wear a device such as a jock strap or underwear with a supportive pouch (scrotal support) as told by your health care provider. If your scrotal support irritates your incision area, you may remove the support. This information is not intended to replace advice given to you by your health care provider. Make sure you discuss any questions you have with your health care provider. Document Revised: 06/23/2021 Document  Reviewed: 06/23/2021 Elsevier Patient Education  [place] Anesthesia, Adult, Care After This sheet gives you information about how to care for yourself after your procedure. Your health care provider may also give you more specific instructions. If you have problems or questions, contact your health care provider. What can I expect after the procedure? After the procedure, the following side effects are common: Pain or discomfort at the IV site. Nausea. Vomiting. Sore throat. Trouble concentrating. Feeling cold or chills. Feeling weak or tired. Sleepiness and fatigue. Soreness and body aches. These side effects can affect parts of the body that were not involved in surgery. Follow these instructions at home: For the time period you were told by your health care provider:  Rest. Do not participate in activities where you could fall or become injured. Do not drive or use machinery. Do not drink alcohol. Do not take sleeping pills or medicines that cause drowsiness. Do not make  important decisions or sign legal documents. Do not take care of children on your own. Eating and drinking Follow any instructions from your health care provider about eating or drinking restrictions. When you feel hungry, start by eating small amounts of foods that are soft and easy to digest (bland), such as toast. Gradually return to your regular diet. Drink enough fluid to keep your urine pale yellow. If you vomit, rehydrate by drinking water, juice, or clear broth. General instructions If you have sleep apnea, surgery and certain medicines can increase your risk for breathing problems. Follow instructions from your health care provider about wearing your sleep device: Anytime you are sleeping, including during daytime naps. While taking prescription pain medicines, sleeping medicines, or medicines that make you drowsy. Have a responsible adult stay with you for the time you are told. It is important to have someone help care for you until you are awake and alert. Return to your normal activities as told by your health care provider. Ask your health care provider what activities are safe for you. Take over-the-counter and prescription medicines only as told by your health care provider. If you smoke, do not smoke without supervision. Keep all follow-up visits as told by your health care provider. This is important. Contact a health care provider if: You have nausea or vomiting that does not get better with medicine. You cannot eat or drink without vomiting. You have pain that does not get better with medicine. You are unable to pass urine. You develop a skin rash. You have a fever. You have redness around your IV site that gets worse. Get help right away if: You have difficulty breathing. You have chest pain. You have blood in your urine or stool, or you vomit blood. Summary After the procedure, it is common to have a sore throat or nausea. It is also common to feel tired. Have a  responsible adult stay with you for the time you are told. It is important to have someone help care for you until you are awake and alert. When you feel hungry, start by eating small amounts of foods that are soft and easy to digest (bland), such as toast. Gradually return to your regular diet. Drink enough fluid to keep your urine pale yellow. Return to your normal activities as told by your health care provider. Ask your health care provider what activities are safe for you. This information is not intended to replace advice given to you by your health care provider. Make sure you discuss any questions you  have with your health care provider. Document Revised: 04/09/2020 Document Reviewed: 11/07/2019 Elsevier Patient Education  [place].

## 2022-01-31 ENCOUNTER — Other Ambulatory Visit (HOSPITAL_COMMUNITY): Payer: Self-pay | Admitting: Hematology

## 2022-01-31 DIAGNOSIS — C3492 Malignant neoplasm of unspecified part of left bronchus or lung: Secondary | ICD-10-CM

## 2022-02-01 MED ORDER — OXYCODONE HCL 10 MG PO TABS: 10.0000 mg | ORAL_TABLET | Freq: Two times a day (BID) | ORAL | 0 refills | Status: DC | PRN

## 2022-02-02 ENCOUNTER — Encounter (HOSPITAL_COMMUNITY): Payer: Self-pay

## 2022-02-02 ENCOUNTER — Encounter (HOSPITAL_COMMUNITY)
Admission: RE | Admit: 2022-02-02 | Discharge: 2022-02-02 | Disposition: A | Discharge status: Home / Self Care | Payer: Medicare HMO | Source: Ambulatory Visit | Attending: Urology | Admitting: Urology

## 2022-02-02 ENCOUNTER — Telehealth: Payer: Self-pay

## 2022-02-02 VITALS — BP 132/91 | HR 87 | Temp 98.7°F | Resp 18 | Ht 69.0 in | Wt 146.0 lb

## 2022-02-02 DIAGNOSIS — Z01812 Encounter for preprocedural laboratory examination: Secondary | ICD-10-CM | POA: Diagnosis not present

## 2022-02-02 DIAGNOSIS — R69 Illness, unspecified: Secondary | ICD-10-CM | POA: Diagnosis not present

## 2022-02-02 DIAGNOSIS — F1010 Alcohol abuse, uncomplicated: Secondary | ICD-10-CM | POA: Diagnosis not present

## 2022-02-02 LAB — COMPREHENSIVE METABOLIC PANEL
ALT: 22 U/L (ref 0–44)
AST: 57 U/L — ABNORMAL HIGH (ref 15–41)
Albumin: 3.7 g/dL (ref 3.5–5.0)
Alkaline Phosphatase: 93 U/L (ref 38–126)
Anion gap: 13 (ref 5–15)
BUN: 5 mg/dL — ABNORMAL LOW (ref 6–20)
CO2: 23 mmol/L (ref 22–32)
Calcium: 9.4 mg/dL (ref 8.9–10.3)
Chloride: 101 mmol/L (ref 98–111)
Creatinine, Ser: 0.6 mg/dL — ABNORMAL LOW (ref 0.61–1.24)
GFR, Estimated: >60 mL/min (ref >60)
Glucose, Bld: 121 mg/dL — ABNORMAL HIGH (ref 70–99)
Potassium: 3.7 mmol/L (ref 3.5–5.1)
Sodium: 137 mmol/L (ref 135–145)
Total Bilirubin: 0.6 mg/dL (ref 0.3–1.2)
Total Protein: 8 g/dL (ref 6.5–8.1)

## 2022-02-02 NOTE — Telephone Encounter (Signed)
Clinicals updated to [employer] as requested for upcoming surgery on 07/03 Pending review

## 2022-02-03 ENCOUNTER — Inpatient Hospital Stay (HOSPITAL_COMMUNITY): Payer: Medicare HMO | Attending: Hematology

## 2022-02-03 ENCOUNTER — Ambulatory Visit (HOSPITAL_COMMUNITY)
Admission: RE | Admit: 2022-02-03 | Discharge: 2022-02-03 | Disposition: A | Discharge status: Home / Self Care | Payer: Medicare HMO | Source: Ambulatory Visit | Attending: Hematology | Admitting: Hematology

## 2022-02-03 DIAGNOSIS — C3412 Malignant neoplasm of upper lobe, left bronchus or lung: Secondary | ICD-10-CM | POA: Insufficient documentation

## 2022-02-03 DIAGNOSIS — I2510 Atherosclerotic heart disease of native coronary artery without angina pectoris: Secondary | ICD-10-CM | POA: Diagnosis not present

## 2022-02-03 DIAGNOSIS — C3492 Malignant neoplasm of unspecified part of left bronchus or lung: Secondary | ICD-10-CM | POA: Insufficient documentation

## 2022-02-03 DIAGNOSIS — J439 Emphysema, unspecified: Secondary | ICD-10-CM | POA: Insufficient documentation

## 2022-02-03 DIAGNOSIS — J90 Pleural effusion, not elsewhere classified: Secondary | ICD-10-CM | POA: Diagnosis not present

## 2022-02-03 DIAGNOSIS — I700 Atherosclerosis of aorta: Secondary | ICD-10-CM | POA: Insufficient documentation

## 2022-02-03 DIAGNOSIS — K209 Esophagitis, unspecified without bleeding: Secondary | ICD-10-CM | POA: Insufficient documentation

## 2022-02-03 DIAGNOSIS — R911 Solitary pulmonary nodule: Secondary | ICD-10-CM | POA: Diagnosis not present

## 2022-02-03 DIAGNOSIS — C3490 Malignant neoplasm of unspecified part of unspecified bronchus or lung: Secondary | ICD-10-CM | POA: Diagnosis not present

## 2022-02-03 DIAGNOSIS — Z79899 Other long term (current) drug therapy: Secondary | ICD-10-CM | POA: Diagnosis not present

## 2022-02-03 LAB — CBC WITH DIFFERENTIAL/PLATELET
Abs Immature Granulocytes: 0.03 10*3/uL (ref 0.00–0.07)
Basophils Absolute: 0 10*3/uL (ref 0.0–0.1)
Basophils Relative: 0 %
Eosinophils Absolute: 0 10*3/uL (ref 0.0–0.5)
Eosinophils Relative: 0 %
HCT: 43.6 % (ref 39.0–52.0)
Hemoglobin: 14.8 g/dL (ref 13.0–17.0)
Immature Granulocytes: 0 %
Lymphocytes Relative: 33 %
Lymphs Abs: 2.4 10*3/uL (ref 0.7–4.0)
MCH: 32 pg (ref 26.0–34.0)
MCHC: 33.9 g/dL (ref 30.0–36.0)
MCV: 94.2 fL (ref 80.0–100.0)
Monocytes Absolute: 0.7 10*3/uL (ref 0.1–1.0)
Monocytes Relative: 10 %
Neutro Abs: 4.1 10*3/uL (ref 1.7–7.7)
Neutrophils Relative %: 57 %
Platelets: 164 10*3/uL (ref 150–400)
RBC: 4.63 MIL/uL (ref 4.22–5.81)
RDW: 15.4 % (ref 11.5–15.5)
WBC: 7.3 10*3/uL (ref 4.0–10.5)
nRBC: 0 % (ref 0.0–0.2)

## 2022-02-03 LAB — COMPREHENSIVE METABOLIC PANEL
ALT: 20 U/L (ref 0–44)
AST: 43 U/L — ABNORMAL HIGH (ref 15–41)
Albumin: 3.6 g/dL (ref 3.5–5.0)
Alkaline Phosphatase: 91 U/L (ref 38–126)
Anion gap: 11 (ref 5–15)
BUN: <5 mg/dL — ABNORMAL LOW (ref 6–20)
CO2: 23 mmol/L (ref 22–32)
Calcium: 9 mg/dL (ref 8.9–10.3)
Chloride: 100 mmol/L (ref 98–111)
Creatinine, Ser: 0.47 mg/dL — ABNORMAL LOW (ref 0.61–1.24)
GFR, Estimated: >60 mL/min (ref >60)
Glucose, Bld: 120 mg/dL — ABNORMAL HIGH (ref 70–99)
Potassium: 3.6 mmol/L (ref 3.5–5.1)
Sodium: 134 mmol/L — ABNORMAL LOW (ref 135–145)
Total Bilirubin: 0.8 mg/dL (ref 0.3–1.2)
Total Protein: 7.6 g/dL (ref 6.5–8.1)

## 2022-02-03 LAB — MAGNESIUM: Magnesium: 1.9 mg/dL (ref 1.7–2.4)

## 2022-02-03 LAB — TSH: TSH: 1.408 u[IU]/mL (ref 0.350–4.500)

## 2022-02-03 MED ORDER — IOHEXOL 300 MG/ML  SOLN
75.0000 mL | Freq: Once | INTRAMUSCULAR | Status: AC | PRN
  Administered 2022-02-03: 75 mL via INTRAVENOUS

## 2022-02-03 NOTE — Progress Notes (Signed)
Patients port flushed without difficulty.  Good blood return noted with no bruising or swelling noted at site.  Band aid applied.  VSS with discharge and left in satisfactory condition with no s/s of distress noted.   

## 2022-02-04 ENCOUNTER — Telehealth: Payer: Self-pay

## 2022-02-09 NOTE — Telephone Encounter (Signed)
Insurance still pending  

## 2022-02-10 ENCOUNTER — Ambulatory Visit (HOSPITAL_COMMUNITY): Payer: Medicare HMO | Admitting: Hematology

## 2022-02-14 ENCOUNTER — Ambulatory Visit: Payer: Medicare HMO | Admitting: Urology

## 2022-02-15 ENCOUNTER — Encounter (HOSPITAL_COMMUNITY)
Admission: RE | Admit: 2022-02-15 | Discharge: 2022-02-15 | Disposition: A | Discharge status: Home / Self Care | Payer: Medicare HMO | Source: Ambulatory Visit | Attending: Urology | Admitting: Urology

## 2022-02-17 ENCOUNTER — Ambulatory Visit (HOSPITAL_BASED_OUTPATIENT_CLINIC_OR_DEPARTMENT_OTHER): Payer: Medicare HMO | Admitting: Anesthesiology

## 2022-02-17 ENCOUNTER — Other Ambulatory Visit: Payer: Self-pay

## 2022-02-17 ENCOUNTER — Encounter (HOSPITAL_COMMUNITY): Payer: Self-pay | Admitting: Urology

## 2022-02-17 ENCOUNTER — Ambulatory Visit (HOSPITAL_COMMUNITY): Payer: Medicare HMO | Admitting: Anesthesiology

## 2022-02-17 ENCOUNTER — Encounter (HOSPITAL_COMMUNITY)
Admission: RE | Disposition: A | Discharge status: Home / Self Care | Payer: Self-pay | Source: Home / Self Care | Attending: Urology

## 2022-02-17 ENCOUNTER — Observation Stay (HOSPITAL_COMMUNITY)
Admission: RE | Admit: 2022-02-17 | Discharge: 2022-02-18 | Disposition: A | Discharge status: Home / Self Care | Payer: Medicare HMO | Attending: Urology | Admitting: Urology

## 2022-02-17 DIAGNOSIS — I10 Essential (primary) hypertension: Secondary | ICD-10-CM | POA: Diagnosis not present

## 2022-02-17 DIAGNOSIS — Z79899 Other long term (current) drug therapy: Secondary | ICD-10-CM | POA: Diagnosis not present

## 2022-02-17 DIAGNOSIS — Z7951 Long term (current) use of inhaled steroids: Secondary | ICD-10-CM | POA: Insufficient documentation

## 2022-02-17 DIAGNOSIS — J449 Chronic obstructive pulmonary disease, unspecified: Secondary | ICD-10-CM | POA: Diagnosis not present

## 2022-02-17 DIAGNOSIS — Z85118 Personal history of other malignant neoplasm of bronchus and lung: Secondary | ICD-10-CM | POA: Diagnosis not present

## 2022-02-17 DIAGNOSIS — N529 Male erectile dysfunction, unspecified: Principal | ICD-10-CM | POA: Diagnosis present

## 2022-02-17 DIAGNOSIS — C3492 Malignant neoplasm of unspecified part of left bronchus or lung: Secondary | ICD-10-CM

## 2022-02-17 DIAGNOSIS — Z7982 Long term (current) use of aspirin: Secondary | ICD-10-CM | POA: Diagnosis not present

## 2022-02-17 HISTORY — PX: PENILE PROSTHESIS IMPLANT: SHX240

## 2022-02-17 LAB — CBC
HCT: 44.8 % (ref 39.0–52.0)
Hemoglobin: 14.8 g/dL (ref 13.0–17.0)
MCH: 32.1 pg (ref 26.0–34.0)
MCHC: 33 g/dL (ref 30.0–36.0)
MCV: 97.2 fL (ref 80.0–100.0)
Platelets: 240 10*3/uL (ref 150–400)
RBC: 4.61 MIL/uL (ref 4.22–5.81)
RDW: 15.2 % (ref 11.5–15.5)
WBC: 8.4 10*3/uL (ref 4.0–10.5)
nRBC: 0 % (ref 0.0–0.2)

## 2022-02-17 LAB — BASIC METABOLIC PANEL
Anion gap: 8 (ref 5–15)
BUN: 5 mg/dL — ABNORMAL LOW (ref 6–20)
CO2: 23 mmol/L (ref 22–32)
Calcium: 8.5 mg/dL — ABNORMAL LOW (ref 8.9–10.3)
Chloride: 104 mmol/L (ref 98–111)
Creatinine, Ser: 0.57 mg/dL — ABNORMAL LOW (ref 0.61–1.24)
GFR, Estimated: >60 mL/min (ref >60)
Glucose, Bld: 136 mg/dL — ABNORMAL HIGH (ref 70–99)
Potassium: 4.3 mmol/L (ref 3.5–5.1)
Sodium: 135 mmol/L (ref 135–145)

## 2022-02-17 SURGERY — INSERTION, PENILE PROSTHESIS, INFLATABLE
Anesthesia: General | Site: Penis

## 2022-02-17 MED ORDER — SUGAMMADEX SODIUM 200 MG/2ML IV SOLN
INTRAVENOUS | Status: DC | PRN
  Administered 2022-02-17: 150 mg via INTRAVENOUS

## 2022-02-17 MED ORDER — ORAL CARE MOUTH RINSE: 15.0000 mL | Freq: Once | OROMUCOSAL | Status: AC

## 2022-02-17 MED ORDER — OXYCODONE HCL 5 MG PO TABS
5.0000 mg | ORAL_TABLET | ORAL | Status: DC | PRN
  Administered 2022-02-17 – 2022-02-18 (×4): 5 mg via ORAL
  Filled 2022-02-17 (×4): qty 1

## 2022-02-17 MED ORDER — OXYBUTYNIN CHLORIDE 5 MG PO TABS: 5.0000 mg | ORAL_TABLET | Freq: Three times a day (TID) | ORAL | Status: DC | PRN

## 2022-02-17 MED ORDER — HYDROMORPHONE HCL 1 MG/ML IJ SOLN
INTRAMUSCULAR | Status: DC | PRN
  Administered 2022-02-17 (×2): .5 mg via INTRAVENOUS

## 2022-02-17 MED ORDER — ONDANSETRON HCL 4 MG/2ML IJ SOLN
INTRAMUSCULAR | Status: DC | PRN
  Administered 2022-02-17: 4 mg via INTRAVENOUS

## 2022-02-17 MED ORDER — FLUTICASONE FUROATE-VILANTEROL 100-25 MCG/ACT IN AEPB
1.0000 | INHALATION_SPRAY | Freq: Every day | RESPIRATORY_TRACT | Status: DC
  Administered 2022-02-18: 1 via RESPIRATORY_TRACT
  Filled 2022-02-17: qty 28

## 2022-02-17 MED ORDER — ONDANSETRON HCL 4 MG/2ML IJ SOLN
INTRAMUSCULAR | Status: AC
  Filled 2022-02-17: qty 2

## 2022-02-17 MED ORDER — CHLORHEXIDINE GLUCONATE 0.12 % MT SOLN
15.0000 mL | Freq: Once | OROMUCOSAL | Status: AC
  Administered 2022-02-17: 15 mL via OROMUCOSAL

## 2022-02-17 MED ORDER — DEXMEDETOMIDINE HCL IN NACL 200 MCG/50ML IV SOLN
INTRAVENOUS | Status: DC | PRN
  Administered 2022-02-17 (×4): 4 ug via INTRAVENOUS

## 2022-02-17 MED ORDER — ONDANSETRON HCL 4 MG/2ML IJ SOLN: 4.0000 mg | INTRAMUSCULAR | Status: DC | PRN

## 2022-02-17 MED ORDER — GABAPENTIN 300 MG PO CAPS
300.0000 mg | ORAL_CAPSULE | Freq: Three times a day (TID) | ORAL | Status: DC
  Administered 2022-02-17 – 2022-02-18 (×2): 300 mg via ORAL
  Filled 2022-02-17 (×2): qty 1

## 2022-02-17 MED ORDER — SODIUM CHLORIDE 0.9 % IV SOLN: INTRAVENOUS | Status: DC

## 2022-02-17 MED ORDER — PROPOFOL 10 MG/ML IV BOLUS
INTRAVENOUS | Status: AC
  Filled 2022-02-17: qty 20

## 2022-02-17 MED ORDER — METOPROLOL TARTRATE 5 MG/5ML IV SOLN
INTRAVENOUS | Status: DC | PRN
  Administered 2022-02-17: 1 mg via INTRAVENOUS

## 2022-02-17 MED ORDER — FENTANYL CITRATE (PF) 100 MCG/2ML IJ SOLN
INTRAMUSCULAR | Status: AC
  Filled 2022-02-17: qty 2

## 2022-02-17 MED ORDER — ONDANSETRON HCL 4 MG/2ML IJ SOLN: 4.0000 mg | Freq: Once | INTRAMUSCULAR | Status: DC | PRN

## 2022-02-17 MED ORDER — ALBUTEROL SULFATE (2.5 MG/3ML) 0.083% IN NEBU
3.0000 mL | INHALATION_SOLUTION | RESPIRATORY_TRACT | Status: DC | PRN
Start: 2022-02-17 — End: 2022-02-18

## 2022-02-17 MED ORDER — MIDAZOLAM HCL 2 MG/2ML IJ SOLN
INTRAMUSCULAR | Status: AC
  Filled 2022-02-17: qty 2

## 2022-02-17 MED ORDER — ACETAMINOPHEN 10 MG/ML IV SOLN
INTRAVENOUS | Status: AC
  Filled 2022-02-17: qty 100

## 2022-02-17 MED ORDER — 0.9 % SODIUM CHLORIDE (POUR BTL) OPTIME
TOPICAL | Status: DC | PRN
  Administered 2022-02-17: 1000 mL
  Administered 2022-02-17: 500 mL

## 2022-02-17 MED ORDER — VANCOMYCIN HCL IN DEXTROSE 1-5 GM/200ML-% IV SOLN
1000.0000 mg | Freq: Once | INTRAVENOUS | Status: AC
  Administered 2022-02-18: 1000 mg via INTRAVENOUS
  Filled 2022-02-17: qty 200

## 2022-02-17 MED ORDER — HYDROMORPHONE HCL 1 MG/ML IJ SOLN
0.5000 mg | INTRAMUSCULAR | Status: DC | PRN
  Administered 2022-02-17 – 2022-02-18 (×6): 1 mg via INTRAVENOUS
  Filled 2022-02-17 (×6): qty 1

## 2022-02-17 MED ORDER — PROPOFOL 10 MG/ML IV BOLUS
INTRAVENOUS | Status: DC | PRN
  Administered 2022-02-17: 30 mg via INTRAVENOUS
  Administered 2022-02-17: 120 mg via INTRAVENOUS

## 2022-02-17 MED ORDER — AMLODIPINE BESYLATE 5 MG PO TABS
5.0000 mg | ORAL_TABLET | Freq: Every day | ORAL | Status: DC
  Administered 2022-02-18: 5 mg via ORAL
  Filled 2022-02-17: qty 1

## 2022-02-17 MED ORDER — METOPROLOL TARTRATE 5 MG/5ML IV SOLN
INTRAVENOUS | Status: AC
  Filled 2022-02-17: qty 5

## 2022-02-17 MED ORDER — GENTAMICIN SULFATE 40 MG/ML IJ SOLN
340.0000 mg | INTRAVENOUS | Status: AC
  Administered 2022-02-17: 340 mg via INTRAVENOUS
  Filled 2022-02-17: qty 8.5

## 2022-02-17 MED ORDER — LIDOCAINE HCL (PF) 2 % IJ SOLN
INTRAMUSCULAR | Status: AC
  Filled 2022-02-17: qty 15

## 2022-02-17 MED ORDER — HYDROCODONE-ACETAMINOPHEN 7.5-325 MG PO TABS
1.0000 | ORAL_TABLET | Freq: Once | ORAL | Status: AC | PRN
  Administered 2022-02-17: 1 via ORAL
  Filled 2022-02-17: qty 1

## 2022-02-17 MED ORDER — FENTANYL CITRATE PF 50 MCG/ML IJ SOSY
25.0000 ug | PREFILLED_SYRINGE | INTRAMUSCULAR | Status: DC | PRN
  Administered 2022-02-17 (×3): 50 ug via INTRAVENOUS
  Filled 2022-02-17 (×3): qty 1

## 2022-02-17 MED ORDER — CHLORHEXIDINE GLUCONATE 4 % EX LIQD: Freq: Once | CUTANEOUS | Status: DC

## 2022-02-17 MED ORDER — LIDOCAINE 2% (20 MG/ML) 5 ML SYRINGE
INTRAMUSCULAR | Status: DC | PRN
  Administered 2022-02-17: 80 mg via INTRAVENOUS

## 2022-02-17 MED ORDER — SODIUM CHLORIDE 0.9 % IV BOLUS
Freq: Once | INTRAVENOUS | Status: AC
  Administered 2022-02-17: 1000 mL
  Filled 2022-02-17 (×3): qty 10

## 2022-02-17 MED ORDER — VANCOMYCIN HCL 1500 MG/300ML IV SOLN
1500.0000 mg | INTRAVENOUS | Status: AC
  Administered 2022-02-17: 1500 mg via INTRAVENOUS
  Filled 2022-02-17: qty 300

## 2022-02-17 MED ORDER — DEXAMETHASONE SODIUM PHOSPHATE 10 MG/ML IJ SOLN
INTRAMUSCULAR | Status: DC | PRN
  Administered 2022-02-17: 8 mg via INTRAVENOUS

## 2022-02-17 MED ORDER — UMECLIDINIUM BROMIDE 62.5 MCG/ACT IN AEPB
1.0000 | INHALATION_SPRAY | Freq: Every day | RESPIRATORY_TRACT | Status: DC
  Administered 2022-02-18: 1 via RESPIRATORY_TRACT
  Filled 2022-02-17: qty 7

## 2022-02-17 MED ORDER — FENTANYL CITRATE (PF) 100 MCG/2ML IJ SOLN
INTRAMUSCULAR | Status: DC | PRN
  Administered 2022-02-17 (×7): 50 ug via INTRAVENOUS

## 2022-02-17 MED ORDER — LACTATED RINGERS IV SOLN
INTRAVENOUS | Status: DC
  Administered 2022-02-17: 1000 mL via INTRAVENOUS

## 2022-02-17 MED ORDER — DOUBLE ANTIBIOTIC 500-10000 UNIT/GM EX OINT
TOPICAL_OINTMENT | CUTANEOUS | Status: DC | PRN
  Administered 2022-02-17: 1 via TOPICAL

## 2022-02-17 MED ORDER — DIPHENHYDRAMINE HCL 50 MG/ML IJ SOLN: 12.5000 mg | Freq: Four times a day (QID) | INTRAMUSCULAR | Status: DC | PRN

## 2022-02-17 MED ORDER — DIPHENHYDRAMINE HCL 12.5 MG/5ML PO ELIX: 12.5000 mg | ORAL_SOLUTION | Freq: Four times a day (QID) | ORAL | Status: DC | PRN

## 2022-02-17 MED ORDER — ESMOLOL HCL 100 MG/10ML IV SOLN
INTRAVENOUS | Status: DC | PRN
  Administered 2022-02-17 (×3): 10 mg via INTRAVENOUS

## 2022-02-17 MED ORDER — MIDAZOLAM HCL 5 MG/5ML IJ SOLN
INTRAMUSCULAR | Status: DC | PRN
  Administered 2022-02-17: 2 mg via INTRAVENOUS

## 2022-02-17 MED ORDER — ACETAMINOPHEN 10 MG/ML IV SOLN
INTRAVENOUS | Status: DC | PRN
  Administered 2022-02-17: 1000 mg via INTRAVENOUS

## 2022-02-17 MED ORDER — DEXAMETHASONE SODIUM PHOSPHATE 10 MG/ML IJ SOLN
INTRAMUSCULAR | Status: AC
  Filled 2022-02-17: qty 1

## 2022-02-17 MED ORDER — ROCURONIUM BROMIDE 100 MG/10ML IV SOLN
INTRAVENOUS | Status: DC | PRN
  Administered 2022-02-17: 10 mg via INTRAVENOUS
  Administered 2022-02-17: 50 mg via INTRAVENOUS

## 2022-02-17 MED ORDER — STERILE WATER FOR IRRIGATION IR SOLN
Status: DC | PRN
  Administered 2022-02-17: 500 mL

## 2022-02-17 MED ORDER — ZOLPIDEM TARTRATE 5 MG PO TABS: 5.0000 mg | ORAL_TABLET | Freq: Every evening | ORAL | Status: DC | PRN

## 2022-02-17 MED ORDER — HYDROMORPHONE HCL 1 MG/ML IJ SOLN
INTRAMUSCULAR | Status: AC
  Filled 2022-02-17: qty 1

## 2022-02-17 SURGICAL SUPPLY — 55 items
BAG URINE DRAIN 2000ML AR STRL (UROLOGICAL SUPPLIES) ×1 IMPLANT
BLADE SURG 15 STRL LF DISP TIS (BLADE) ×1 IMPLANT
BLADE SURG 15 STRL SS (BLADE) ×2
BRUSH SCRUB EZ  4% CHG (MISCELLANEOUS) ×4
BRUSH SCRUB EZ 4% CHG (MISCELLANEOUS) ×2 IMPLANT
CATH FOLEY 2WAY SLVR  5CC 16FR (CATHETERS) ×2
CATH FOLEY 2WAY SLVR 5CC 16FR (CATHETERS) ×1 IMPLANT
CHLORAPREP W/TINT 26 (MISCELLANEOUS) ×4 IMPLANT
COVER MAYO STAND XLG (MISCELLANEOUS) ×2 IMPLANT
DERMABOND ADVANCED (GAUZE/BANDAGES/DRESSINGS) ×2
DERMABOND ADVANCED .7 DNX12 (GAUZE/BANDAGES/DRESSINGS) ×1 IMPLANT
DRAIN PENROSE 0.5X18 (DRAIN) ×1 IMPLANT
DRAPE INCISE IOBAN 66X45 STRL (DRAPES) ×2 IMPLANT
ELECT NDL BLADE 2-5/6 (NEEDLE) IMPLANT
ELECT NEEDLE BLADE 2-5/6 (NEEDLE) ×2 IMPLANT
ELECT REM PT RETURN 9FT ADLT (ELECTROSURGICAL) ×2
ELECTRODE REM PT RTRN 9FT ADLT (ELECTROSURGICAL) ×1 IMPLANT
GAUZE 4X4 16PLY ~~LOC~~+RFID DBL (SPONGE) ×3 IMPLANT
GAUZE SPONGE 4X4 12PLY STRL (GAUZE/BANDAGES/DRESSINGS) ×2 IMPLANT
GLOVE BIO SURGEON STRL SZ7 (GLOVE) ×1 IMPLANT
GLOVE BIO SURGEON STRL SZ8 (GLOVE) ×4 IMPLANT
GLOVE BIOGEL PI IND STRL 7.0 (GLOVE) ×2 IMPLANT
GLOVE BIOGEL PI IND STRL 7.5 (GLOVE) IMPLANT
GLOVE BIOGEL PI IND STRL 8 (GLOVE) IMPLANT
GLOVE BIOGEL PI INDICATOR 7.0 (GLOVE) ×2
GLOVE BIOGEL PI INDICATOR 7.5 (GLOVE) ×1
GLOVE BIOGEL PI INDICATOR 8 (GLOVE) ×1
GLOVE ECLIPSE 6.5 STRL STRAW (GLOVE) ×2 IMPLANT
GOWN STRL REUS W/TWL LRG LVL3 (GOWN DISPOSABLE) ×4 IMPLANT
GOWN STRL REUS W/TWL XL LVL3 (GOWN DISPOSABLE) ×2 IMPLANT
IMPL RTE SNAPCONE 0.5CM (Urological Implant) IMPLANT
IMPLANT RTE SNAPCONE 0.5CM (Urological Implant) ×2 IMPLANT
KIT ACCESSORY AMS 700 PUMP (UROLOGICAL SUPPLIES) ×1 IMPLANT
KIT TURNOVER KIT A (KITS) ×2 IMPLANT
NS IRRIG 1000ML POUR BTL (IV SOLUTION) ×2 IMPLANT
NS IRRIG 500ML POUR BTL (IV SOLUTION) ×1 IMPLANT
PACK ABDOMINAL MAJOR (CUSTOM PROCEDURE TRAY) ×2 IMPLANT
PANTS MESH DISP LRG (UNDERPADS AND DIAPERS) ×2 IMPLANT
PLUG CATH AND CAP STER (CATHETERS) ×2 IMPLANT
PUMP PRECONNECT MS 21 LGX (Erectile Restoration) IMPLANT
PUMP PRECONNECT MS 21CM LGX (Erectile Restoration) ×2 IMPLANT
RESERVOIR FLAT IZ 100ML (Miscellaneous) ×1 IMPLANT
RETRACTOR DEEP SCROTAL PENILE (MISCELLANEOUS) ×1 IMPLANT
SET BASIN LINEN APH (SET/KITS/TRAYS/PACK) ×2 IMPLANT
SUPPORT SCROTAL LG STRP (MISCELLANEOUS) ×1 IMPLANT
SUPPORT SCROTAL MED ADLT STRP (MISCELLANEOUS) ×1 IMPLANT
SUT MNCRL AB 4-0 PS2 18 (SUTURE) ×2 IMPLANT
SUT VIC AB 2-0 UR6 27 (SUTURE) ×8 IMPLANT
SUT VIC AB 3-0 SH 27 (SUTURE) ×6
SUT VIC AB 3-0 SH 27X BRD (SUTURE) IMPLANT
SYR 10ML LL (SYRINGE) ×4 IMPLANT
SYR 50ML LL SCALE MARK (SYRINGE) ×5 IMPLANT
TOWEL OR 17X26 4PK STRL BLUE (TOWEL DISPOSABLE) ×1 IMPLANT
WATER STERILE IRR 500ML POUR (IV SOLUTION) ×2 IMPLANT
YANKAUER SUCT BULB TIP NO VENT (SUCTIONS) ×1 IMPLANT

## 2022-02-17 NOTE — Interval H&P Note (Signed)
History and Physical Interval Note:  02/17/2022 12:33 PM  Bryan Fleming  has presented today for surgery, with the diagnosis of erectile dysfunction.  The various methods of treatment have been discussed with the patient and family. After consideration of risks, benefits and other options for treatment, the patient has consented to  Procedure(s): [place] (N/A) as a surgical intervention.  The patient's history has been reviewed, patient examined, no change in status, stable for surgery.  I have reviewed the patient's chart and labs.  Questions were answered to the patient's satisfaction.     Nicolette Bang

## 2022-02-17 NOTE — Progress Notes (Signed)
Patient ambulated in hallway without issue. Now sitting up in chair. Tolerated activity well. Pain 9/10.Family at bedside.

## 2022-02-17 NOTE — Anesthesia Procedure Notes (Signed)
Procedure Name: Intubation Date/Time: 02/17/2022 1:15 PM  Performed by: Gwyndolyn Saxon, CRNAPre-anesthesia Checklist: Patient identified, Emergency Drugs available, Suction available and Patient being monitored Patient Re-evaluated:Patient Re-evaluated prior to induction Oxygen Delivery Method: Circle system utilized Preoxygenation: Pre-oxygenation with 100% oxygen Induction Type: IV induction Ventilation: Mask ventilation without difficulty Laryngoscope Size: Miller and 2 Grade View: Grade I Tube type: Oral Tube size: 7.5 mm Number of attempts: 1 Airway Equipment and Method: Patient positioned with wedge pillow and Stylet Placement Confirmation: ETT inserted through vocal cords under direct vision, positive ETCO2 and breath sounds checked- equal and bilateral Secured at: 23 cm Tube secured with: Tape Dental Injury: Teeth and Oropharynx as per pre-operative assessment

## 2022-02-17 NOTE — Op Note (Signed)
Preoperative diagnosis: Erectile Dysfunction  Postoperative diagnosis: Same  Procedure: 1. Placement of an AMS 700 3 Piece inflatable penile prosthesis  Attending: Rosie Fate, MD  Anesthesia: General  History of blood loss: Minimal  Antibiotics: Vancomycin and Gentamicin  Drains: 16 french foley  Specimens: none  Findings: 100cc reservoir placed on the left. 21cm cylinder length on right and left. 21cm cylinder with no rear tip extension. No deformity or curvature on cycling of the device  Indications: Patient is a 61 year old male with a history of erectile dysfunction who has failed medical therapy.  We discussed the treatment options and he has elected to pursue penile prosthesis insertion.   Procedure in detail: Prior to procedure consent was obtained.  Patient was brought to the operating room and a brief timeout was done to ensure correct patient, correct procedure, correct site.  General anesthesia was administered and patient was placed in supine position. We performed a 10 minute scrub of his genitalia prior to the using alcohol prep.   His genitalia and abdomen was then prepped and draped in usual sterile fashion.  A 16 French foley catheter was placed and the bladder was drained. The penile was then placed on stretch with the aid of a hook through the meatus attached to the lonestar retractor. A 4 cm incision was made at the penoscrotal junction.  We dissected down to the urethra and corporal bodies.  We then placed 2 stay sutures on the medial and lateral aspect of the corporal bodies with 2-0 vicryl. We then used electrocautery to make a 2cm longitudinal incision between the stay sutures on the right corporal body. We then used sequential dilators to dilate the proximal and distal corporal body to 77mm. We the measured the proximal corporal length which was 8.5. We then measured the distal corporal length which was 12.5.  The corporal body was the irrigated with normal  saline. We then did a similar technique on the left. The proximal corporal length was 8.5cm and the distal corporal length was 12.5cm. We then turned our attention to placing the reservoir. We used blunt dissection along the left spermatic cord to create a space past the inguinal canal. We then placed the reservoir and filled it with 65cc of normal saline and then a rubber shod was placed on the tubing. WE then turned our attention to placing the cylinders. We placed the proximal end of the right cylinder into the corpora and seated it with the aid of the applicator. We the placed the string attached to the distal end of the cylinder through a Goulds needle. We then attached the needle to the furlough and advanced the furlough through the corporal incision up to the glans. The needle was then advanced through the glans and the string was then secured with a snap. Once cylinder was seated int he corpora we then tied the stay sutures over the cylinder. We then turned our attention to the left corpora. A similar technique was used to place the left cylinder. Once this was complete we then tested the device and noted no leaks and a straight erection.We then deflated the device. We then proceeded to make a subdartos pocket in the scrotum for the pump. Once this was complete the pump was placed the the pouch and the pouch was then closed with a running 2-0 Vicryl. We then turned our attention to connecting the device. The tubing was cut to length and then the locking clips were placed on either end of the  tubing. A barrel connector was then placed on on end of the tubing. The tubing was then irrigated with normal saline to ensure no air bubbles in the tubing. The free end of the barrel was then attached to the reservoir tubing and using the crimping tool the barrel was secured to the tubing. We then closed the dartos over the tubing suing a 2-0 vicryl in a running fashion. We then closed a second layer of dartos over the  tubing. The skin was then closed with 4-0 monocryl in a running fashion. Skin glue was then placed over the incision. We then removed the strings attached to the distal ends of the cylinders and placed skin glue over the incisions in the glans. The device was then cycled to partially erect.  We then placed a scrotal fluff and this then concluded the procedure which was well tolerated by the patient.  Complications: None  Condition: Stable, extubated, transferred to PACU.  Plan: Patient is to be admitted overnight for IV antibiotics.  His foley will be removed in the morning and the device will be deactivated. He will followup in 2 weeks for a wound check. He will be discharged with 1 week of antibiotics.

## 2022-02-17 NOTE — Anesthesia Preprocedure Evaluation (Addendum)
Anesthesia Evaluation  Patient identified by MRN, date of birth, ID band Patient awake    Reviewed: Allergy & Precautions, NPO status , Patient's Chart, lab work & pertinent test results  History of Anesthesia Complications Negative for: history of anesthetic complications  Airway Mallampati: II  TM Distance: >3 FB Neck ROM: Full    Dental  (+) Dental Advisory Given   Pulmonary shortness of breath, COPD,  COPD inhaler, Current Smoker,    breath sounds clear to auscultation       Cardiovascular hypertension, Pt. on medications (-) angina(-) CHF negative cardio ROS   Rhythm:Regular  1. Left ventricular ejection fraction, by estimation, is >75%. The left  ventricle has hyperdynamic function. The left ventricle has no regional  wall motion abnormalities. There is moderate left ventricular hypertrophy.  Left ventricular diastolic  parameters are indeterminate. Increased LVOT gradient with Valsalva,  approximately 2-3 m/s.  2. Right ventricular systolic function is normal. The right ventricular  size is normal. Tricuspid regurgitation signal is inadequate for assessing  PA pressure.  3. The mitral valve is grossly normal. Trivial mitral valve  regurgitation.  4. The aortic valve is tricuspid. There is mild calcification of the  aortic valve. Aortic valve regurgitation is mild.  5. The inferior vena cava is normal in size with greater than 50%  respiratory variability, suggesting right atrial pressure of 3 mmHg.    Neuro/Psych PSYCHIATRIC DISORDERS Anxiety Depression  Neuromuscular disease (peripheral neuropathy 2ry chemo)    GI/Hepatic GERD  Medicated and Controlled,(+) Hepatitis -  Endo/Other  negative endocrine ROS  Renal/GU negative Renal ROS     Musculoskeletal negative musculoskeletal ROS (+)   Abdominal   Peds  Hematology  (+) Blood dyscrasia, anemia , Lab Results      Component                Value                Date                      WBC                      8.1                 04/19/2021                HGB                      13.6                04/19/2021                HCT                      40.4                04/19/2021                MCV                      95.5                04/19/2021                PLT                      306  04/19/2021              Anesthesia Other Findings   Reproductive/Obstetrics                             Anesthesia Physical  Anesthesia Plan  ASA: 2  Anesthesia Plan: General   Post-op Pain Management:    Induction: Intravenous  PONV Risk Score and Plan:   Airway Management Planned: LMA and Oral ETT  Additional Equipment:   Intra-op Plan:   Post-operative Plan:   Informed Consent: I have reviewed the patients History and Physical, chart, labs and discussed the procedure including the risks, benefits and alternatives for the proposed anesthesia with the patient or authorized representative who has indicated his/her understanding and acceptance.     Dental advisory given  Plan Discussed with: CRNA  Anesthesia Plan Comments:        Anesthesia Quick Evaluation

## 2022-02-17 NOTE — Transfer of Care (Signed)
Immediate Anesthesia Transfer of Care Note  Patient: [name]  Procedure(s) Performed: PENILE PROTHESIS INFLATABLE (Penis)  Patient Location: PACU  Anesthesia Type:General  Level of Consciousness: awake, alert  and oriented  Airway & Oxygen Therapy: Patient Spontanous Breathing and Patient connected to nasal cannula oxygen  Post-op Assessment: Report given to RN and Post -op Vital signs reviewed and stable  Post vital signs: Reviewed and stable  Last Vitals:  Vitals Value Taken Time  BP 140/69 02/17/22 1530  Temp    Pulse 90 02/17/22 1535  Resp 12 02/17/22 1535  SpO2 98 % 02/17/22 1535  Vitals shown include unvalidated device data.  Last Pain:  Vitals:   02/17/22 1106  TempSrc: Oral  PainSc: 0-No pain      Patients Stated Pain Goal: 5 (26/33/35 4562)  Complications: No notable events documented.

## 2022-02-18 DIAGNOSIS — Z85118 Personal history of other malignant neoplasm of bronchus and lung: Secondary | ICD-10-CM | POA: Diagnosis not present

## 2022-02-18 DIAGNOSIS — I10 Essential (primary) hypertension: Secondary | ICD-10-CM | POA: Diagnosis not present

## 2022-02-18 DIAGNOSIS — Z79899 Other long term (current) drug therapy: Secondary | ICD-10-CM | POA: Diagnosis not present

## 2022-02-18 DIAGNOSIS — Z7982 Long term (current) use of aspirin: Secondary | ICD-10-CM | POA: Diagnosis not present

## 2022-02-18 DIAGNOSIS — Z7951 Long term (current) use of inhaled steroids: Secondary | ICD-10-CM | POA: Diagnosis not present

## 2022-02-18 DIAGNOSIS — J449 Chronic obstructive pulmonary disease, unspecified: Secondary | ICD-10-CM | POA: Diagnosis not present

## 2022-02-18 DIAGNOSIS — N529 Male erectile dysfunction, unspecified: Secondary | ICD-10-CM | POA: Diagnosis not present

## 2022-02-18 LAB — CBC
HCT: 44.1 % (ref 39.0–52.0)
Hemoglobin: 14 g/dL (ref 13.0–17.0)
MCH: 31.4 pg (ref 26.0–34.0)
MCHC: 31.7 g/dL (ref 30.0–36.0)
MCV: 98.9 fL (ref 80.0–100.0)
Platelets: 226 10*3/uL (ref 150–400)
RBC: 4.46 MIL/uL (ref 4.22–5.81)
RDW: 15.1 % (ref 11.5–15.5)
WBC: 10.5 10*3/uL (ref 4.0–10.5)
nRBC: 0 % (ref 0.0–0.2)

## 2022-02-18 LAB — BASIC METABOLIC PANEL
Anion gap: 8 (ref 5–15)
BUN: 9 mg/dL (ref 6–20)
CO2: 27 mmol/L (ref 22–32)
Calcium: 8.6 mg/dL — ABNORMAL LOW (ref 8.9–10.3)
Chloride: 102 mmol/L (ref 98–111)
Creatinine, Ser: 0.63 mg/dL (ref 0.61–1.24)
GFR, Estimated: >60 mL/min (ref >60)
Glucose, Bld: 136 mg/dL — ABNORMAL HIGH (ref 70–99)
Potassium: 4.1 mmol/L (ref 3.5–5.1)
Sodium: 137 mmol/L (ref 135–145)

## 2022-02-18 MED ORDER — SULFAMETHOXAZOLE-TRIMETHOPRIM 800-160 MG PO TABS: 1.0000 | ORAL_TABLET | Freq: Two times a day (BID) | ORAL | 0 refills | Status: DC

## 2022-02-18 MED ORDER — GENTAMICIN SULFATE 40 MG/ML IJ SOLN
340.0000 mg | Freq: Once | INTRAVENOUS | Status: AC
  Administered 2022-02-18: 340 mg via INTRAVENOUS
  Filled 2022-02-18: qty 8.5

## 2022-02-18 MED ORDER — CHLORHEXIDINE GLUCONATE CLOTH 2 % EX PADS
6.0000 | MEDICATED_PAD | Freq: Every day | CUTANEOUS | Status: DC
Start: 2022-02-18 — End: 2022-02-18
  Administered 2022-02-18: 6 via TOPICAL

## 2022-02-18 MED ORDER — NICOTINE 14 MG/24HR TD PT24
14.0000 mg | MEDICATED_PATCH | Freq: Every day | TRANSDERMAL | Status: DC
Start: 2022-02-18 — End: 2022-02-18
  Administered 2022-02-18: 14 mg via TRANSDERMAL
  Filled 2022-02-18: qty 1

## 2022-02-18 MED ORDER — OXYCODONE HCL 10 MG PO TABS: 10.0000 mg | ORAL_TABLET | ORAL | 0 refills | Status: DC | PRN

## 2022-02-18 NOTE — Progress Notes (Signed)
Pt status post surgery day 1. Frequent PRN pain meds for pain management at surgical site. Pt had prn oxy 5mg  PO this morning. Stated oxy 5mg  is more effective than dilaudid. Stated last BM was before surgery yesterday. 20G at LFA flushed and patent. Dressing clean, dry, and intact. 16 fr foley cath. Minor swelling at site. Urine yellow draining with no issues. In semi-fowler's with eyes open. Bed in lowest position with call light in reach. No acute distress at this time.

## 2022-02-18 NOTE — TOC Progression Note (Signed)
Transition of Care [facility]) - Progression Note    Patient Details  Name: Bryan Fleming MRN: [id number] Date of Birth: 05/22/1961  Transition of Care [facility]) CM/SW Contact  Salome Arnt, [place] Phone Number: 02/18/2022, 10:23 AM  Clinical Narrative:   Transition of Care (TOC) Screening Note   Patient Details  Name: Bryan Fleming Date of Birth: June 13, 1961   Transition of Care [facility]) CM/SW Contact:    Salome Arnt, [place] Phone Number: 02/18/2022, 10:23 AM    Transition of Care Department [facility]) has reviewed patient and no TOC needs have been identified at this time. We will continue to monitor patient advancement through interdisciplinary progression rounds. If new patient transition needs arise, please place a TOC consult.         Barriers to Discharge: Barriers Resolved  Expected Discharge Plan and Services           Expected Discharge Date: 02/18/22                                     Social Determinants of Health (SDOH) Interventions    Readmission Risk Interventions     No data to display

## 2022-02-18 NOTE — Anesthesia Postprocedure Evaluation (Signed)
Anesthesia Post Note  Patient: Bryan Fleming  Procedure(s) Performed: PENILE PROTHESIS INFLATABLE (Penis)  Patient location during evaluation: PACU Anesthesia Type: General Level of consciousness: awake and alert Pain management: pain level controlled Vital Signs Assessment: post-procedure vital signs reviewed and stable Respiratory status: spontaneous breathing, nonlabored ventilation, respiratory function stable and patient connected to nasal cannula oxygen Cardiovascular status: blood pressure returned to baseline and stable Postop Assessment: no apparent nausea or vomiting Anesthetic complications: no   There were no known notable events for this encounter.   Last Vitals:  Vitals:   02/18/22 0459 02/18/22 0815  BP: 122/79   Pulse: 90   Resp: 18   Temp: 37.1 C   SpO2: 91% 95%    Last Pain:  Vitals:   02/18/22 1109  TempSrc:   PainSc: 5                  Trixie Rude

## 2022-02-20 NOTE — Telephone Encounter (Signed)
Case approved.

## 2022-02-21 ENCOUNTER — Encounter (HOSPITAL_COMMUNITY): Payer: Self-pay | Admitting: Urology

## 2022-02-23 ENCOUNTER — Encounter: Payer: Self-pay | Admitting: Urology

## 2022-02-23 ENCOUNTER — Ambulatory Visit (INDEPENDENT_AMBULATORY_CARE_PROVIDER_SITE_OTHER): Payer: Medicare HMO | Admitting: Urology

## 2022-02-23 VITALS — BP 145/73 | HR 120

## 2022-02-23 DIAGNOSIS — C3492 Malignant neoplasm of unspecified part of left bronchus or lung: Secondary | ICD-10-CM

## 2022-02-23 DIAGNOSIS — Z5189 Encounter for other specified aftercare: Secondary | ICD-10-CM

## 2022-02-23 MED ORDER — OXYCODONE HCL 10 MG PO TABS: 10.0000 mg | ORAL_TABLET | ORAL | 0 refills | Status: DC | PRN

## 2022-02-23 NOTE — Progress Notes (Signed)
02/23/2022 10:46 AM   Bryan Fleming Bryan Fleming 1961-04-23 [id number]  Referring provider: Carrolyn Meiers, MD [place],  [place] [address]  Followup after IPP placement   HPI: [name] is a 61yo here for followup after IPP placement. Mild incisional pain. Penile and scrotal swelling has decreased. No other complaints today. Patient urinating well.    PMH: Past Medical History:  Diagnosis Date   Cancer ([place])    Lung cancer   COPD (chronic obstructive pulmonary disease) ([place]) 2007   EMPHYSEMA   Depression    Dyspnea    occasional - no oxygen   GERD (gastroesophageal reflux disease) 2010   Hepatitis    Alcoholic    HLD (hyperlipidemia)    no meds   Hypertension    Port-A-Cath in place 01/01/2021    Surgical History: Past Surgical History:  Procedure Laterality Date   BRONCHIAL BIOPSY  12/23/2020   Procedure: BRONCHIAL BIOPSIES;  Surgeon: [name], MD;  Location: [place];  Service: Pulmonary;;   BRONCHIAL BRUSHINGS  12/23/2020   Procedure: BRONCHIAL BRUSHINGS;  Surgeon: [name], MD;  Location: [place];  Service: Pulmonary;;   BRONCHIAL NEEDLE ASPIRATION BIOPSY  12/23/2020   Procedure: BRONCHIAL NEEDLE ASPIRATION BIOPSIES;  Surgeon: [name], MD;  Location: [place];  Service: Pulmonary;;   BRONCHIAL WASHINGS  12/23/2020   Procedure: BRONCHIAL WASHINGS;  Surgeon: [name], MD;  Location: [facility] ENDOSCOPY;  Service: Pulmonary;;   CARDIAC CATHETERIZATION  2010   COLONOSCOPY  03/14/2012   Procedure: COLONOSCOPY;  Surgeon: Rogene Houston, MD;  [facility];  Service: Endoscopy;  Laterality: N/A;  100   INTERCOSTAL NERVE BLOCK  04/21/2021   Procedure: INTERCOSTAL NERVE BLOCK;  Surgeon: Melrose Nakayama, MD;  Location: [place];  Service: Thoracic;;   IR IMAGING GUIDED PORT INSERTION  01/12/2021   LUNG REMOVAL, PARTIAL  08/2021   PENILE PROSTHESIS IMPLANT N/A 02/17/2022   Procedure: PENILE  PROTHESIS INFLATABLE;  Surgeon: Cleon Gustin, MD;  Location: [facility];  Service: Urology;  Laterality: N/A;   PORTACATH PLACEMENT  07/2021   VIDEO ASSISTED THORACOSCOPY (VATS)/ LOBECTOMY Left 04/21/2021   Procedure: VIDEO ASSISTED THORACOSCOPY (VATS)/ LOBECTOMY;  Surgeon: Melrose Nakayama, MD;  Location: [place];  Service: Thoracic;  Laterality: Left;   VIDEO BRONCHOSCOPY WITH ENDOBRONCHIAL NAVIGATION N/A 12/23/2020   Procedure: VIDEO BRONCHOSCOPY WITH ENDOBRONCHIAL NAVIGATION;  Surgeon: [name], MD;  Location: [place] ENDOSCOPY;  Service: Pulmonary;  Laterality: N/A;   WRIST GANGLION EXCISION  1985    Home Medications:  Allergies as of 02/23/2022       Reactions   Other Nausea Only, Other (See Comments)   No spicy foods- Patient cannot tolerate- HEARTBURN        Medication List        Accurate as of February 23, 2022 10:46 AM. If you have any questions, ask your nurse or doctor.          acetaminophen 500 MG tablet Commonly known as: TYLENOL Take 2 tablets (1,000 mg total) by mouth every 6 (six) hours as needed. What changed: how much to take   amLODipine 5 MG tablet Commonly known as: NORVASC Take 5 mg by mouth daily.   bismuth subsalicylate 334 DH/68SH suspension Commonly known as: PEPTO BISMOL Take 30 mLs by mouth every 6 (six) hours as needed for diarrhea or loose stools or indigestion.   gabapentin 300 MG capsule Commonly known as: NEURONTIN Take 1  capsule (300 mg total) by mouth 3 (three) times daily.   metoprolol tartrate 25 MG tablet Commonly known as: LOPRESSOR Take 25 mg by mouth daily as needed.   multivitamin with minerals Tabs tablet Take 1 tablet by mouth daily. What changed: when to take this   naloxone 4 MG/0.1ML Liqd nasal spray kit Commonly known as: NARCAN Spray 0.1 mL (contents of one device) into one nostril upon signs of opioid overdose.  Call 911.  May repeat once if no response within 2-3 minutes.   Oxycodone HCl 10 MG  Tabs Take 1 tablet (10 mg total) by mouth every 4 (four) hours as needed.   Proventil HFA 108 (90 Base) MCG/ACT inhaler Generic drug: albuterol INHALE 2 PUFFS BY MOUTH EVERY 6 HOURS AS NEEDED FOR COUGHING, WHEEZING, OR SHORTNESS OF BREATH   sulfamethoxazole-trimethoprim 800-160 MG tablet Commonly known as: BACTRIM DS Take 1 tablet by mouth 2 (two) times daily.   tadalafil 5 MG tablet Commonly known as: CIALIS Take 1 tablet (5 mg total) by mouth daily as needed for erectile dysfunction.   Trelegy Ellipta 100-62.5-25 MCG/ACT Aepb Generic drug: Fluticasone-Umeclidin-Vilant Inhale 1 puff into the lungs daily in the afternoon.        Allergies:  Allergies  Allergen Reactions   Other Nausea Only and Other (See Comments)    No spicy foods- Patient cannot tolerate- HEARTBURN    Family History: Family History  Problem Relation Age of Onset   Hypertension Mother    Asthma Father    Diabetes Sister    Hypertension Sister    Hypertension Brother    Hypertension Sister    Lupus Daughter     Social History:  reports that he has been smoking cigarettes. He has a 37.00 pack-year smoking history. He has never used smokeless tobacco. He reports current alcohol use of about 56.0 standard drinks of alcohol per week. He reports that he does not use drugs.  ROS: All other review of systems were reviewed and are negative except what is noted above in HPI  Physical Exam: BP (!) 145/73   Pulse (!) 120   Constitutional:  Alert and oriented, No acute distress. HEENT: [place] AT, moist mucus membranes.  Trachea midline, no masses. Cardiovascular: No clubbing, cyanosis, or edema. Respiratory: Normal respiratory effort, no increased work of breathing. GI: Abdomen is soft, nontender, nondistended, no abdominal masses GU: No CVA tenderness. Circumcised phallus. No masses/lesions on penis, testis, scrotum. Prostate 40g smooth no nodules no induration.  Lymph: No cervical or inguinal  lymphadenopathy. Skin: No rashes, bruises or suspicious lesions. Neurologic: Grossly intact, no focal deficits, moving all 4 extremities. Psychiatric: Normal mood and affect.  Laboratory Data: Lab Results  Component Value Date   WBC 10.5 02/18/2022   HGB 14.0 02/18/2022   HCT 44.1 02/18/2022   MCV 98.9 02/18/2022   PLT 226 02/18/2022    Lab Results  Component Value Date   CREATININE 0.63 02/18/2022    Lab Results  Component Value Date   PSA 0.82 08/20/2015    No results found for: "TESTOSTERONE"  Lab Results  Component Value Date   HGBA1C 5.5 06/04/2020    Urinalysis    Component Value Date/Time   COLORURINE YELLOW 04/19/2021 1510   APPEARANCEUR HAZY (A) 04/19/2021 1510   LABSPEC 1.012 04/19/2021 1510   PHURINE 5.0 04/19/2021 1510   GLUCOSEU NEGATIVE 04/19/2021 1510   HGBUR NEGATIVE 04/19/2021 1510   BILIRUBINUR NEGATIVE 04/19/2021 [place] 04/19/2021 1510   PROTEINUR  NEGATIVE 04/19/2021 1510   UROBILINOGEN 0.2 10/28/2007 1435   NITRITE NEGATIVE 04/19/2021 [place] 04/19/2021 1510    Lab Results  Component Value Date   BACTERIA NONE SEEN 06/04/2020    Pertinent Imaging:  No results found for this or any previous visit.  No results found for this or any previous visit.  No results found for this or any previous visit.  No results found for this or any previous visit.  No results found for this or any previous visit.  No results found for this or any previous visit.  No results found for this or any previous visit.  No results found for this or any previous visit.   Assessment & Plan:    1. Visit for wound check -Rx for oxycodone refilled RTC 5 weeks for IPP activation.   No follow-ups on file.  Nicolette Bang, MD  [facility] [place]

## 2022-02-23 NOTE — Patient Instructions (Signed)
Penile Prosthesis Implantation, Care After The following information offers guidance on how to care for yourself after your procedure. Your health care provider may also give you more specific instructions. If you have problems or questions, contact your health care provider. What can I expect after the procedure? After the procedure, it is common to have: Pain and discomfort at the areas around your incision or incisions. Some swelling of the scrotum or penis. Follow these instructions at home: Medicines Take over-the-counter and prescription medicines only as told by your health care provider. If you were prescribed an antibiotic medicine, take it as told by your health care provider. Do not stop using the antibiotic even if you start to feel better. Ask your health care provider if the medicine prescribed to you: Requires you to avoid driving or using machinery. Can cause constipation. You may need to take these actions to prevent or treat constipation: Drink enough fluid to keep your urine pale yellow. Take over-the-counter or prescription medicines. Eat foods that are high in fiber, such as beans, whole grains, and fresh fruits and vegetables. Limit foods that are high in fat and processed sugars, such as fried or sweet foods. Incision care  Follow instructions from your health care provider about how to take care of your incision or incisions. Make sure you: Wash your hands with soap and water for at least 20 seconds before and after you change your bandage (dressing). If soap and water are not available, use hand sanitizer. Change your dressing as told by your health care provider. Leave stitches (sutures), skin glue, or adhesive strips in place. These skin closures may need to stay in place for 2 weeks or longer. If adhesive strip edges start to loosen and curl up, you may trim the loose edges. Do not remove adhesive strips completely unless your health care provider tells you to do  that. Check your incision area every day for signs of infection. Check for: More redness, swelling, or pain. Fluid or blood. Warmth. Pus or a bad smell. Managing pain and swelling If directed, put ice on the affected area. To do this: Put ice in a plastic bag. Place a towel between your skin and the bag. Leave the ice on for 20 minutes, 2-3 times a day. Remove the ice if your skin turns bright red. This is very important. If you cannot feel pain, heat, or cold, you have a greater risk of damage to the area. Activity If you were given a sedative during the procedure, it can affect you for several hours. Do not drive or operate machinery until your health care provider says that it is safe. Get up to take short walks every 1-2 hours. This is important to improve blood flow and breathing. Ask for help if you feel weak or unsteady. Lie flat instead of sitting when you are resting for the first few days. This is to avoid too much pressure on the scrotum. You may have to avoid lifting. Ask your health care provider how much you can safely lift. Follow instructions from your health care provider about when it is safe for you to engage in sexual activity. Your health care provider will tell you when you may start using your penile implant, which is usually 4-6 weeks after surgery. Return to your normal activities as told by your health care provider. Ask your health care provider what activities are safe for you. General instructions Wear a device similar to a jock strap or underwear with a  supportive pouch (scrotal support) as told by your health care provider. If your scrotal support irritates your incision area, you may remove the support. Do not take baths, swim, or use a hot tub until your health care provider approves. Ask your health care provider if you may take showers. You may only be allowed to take sponge baths. Do not use any products that contain nicotine or tobacco. These products  include cigarettes, chewing tobacco, and vaping devices, such as e-cigarettes. These can delay incision healing after surgery. If you need help quitting, ask your health care provider. Keep all follow-up visits. This is important. Contact a health care provider if: The implant inflates on its own. The implant does not work or it stops working. Your penis becomes curved or it changes shape in some other way. Get help right away if: You have more redness, swelling, or pain around your incision area or in your scrotum. You have fluid or blood coming from your incision area. Your incision area feels warm to the touch. You have pus or a bad smell coming from your incision area. You have pain in your penis that gets worse. The skin on your penis becomes dark or discolored. Summary Check your incision area every day for signs of infection, such as more redness, swelling, or pain. Get up to take short walks every 1-2 hours. This is important to improve blood flow and breathing. Ask for help if you feel weak or unsteady. Wear a device such as a jock strap or underwear with a supportive pouch (scrotal support) as told by your health care provider. If your scrotal support irritates your incision area, you may remove the support. This information is not intended to replace advice given to you by your health care provider. Make sure you discuss any questions you have with your health care provider. Document Revised: 06/23/2021 Document Reviewed: 06/23/2021 Elsevier Patient Education  [place].

## 2022-03-01 NOTE — Discharge Summary (Signed)
Physician Discharge Summary  Patient ID: Bryan Fleming MRN: [id number] DOB/AGE: 60-Jan-1962 60 y.o.  Admit date: 02/17/2022 Discharge date: 02/18/2022  Admission Diagnoses:  Erectile dysfunction  Discharge Diagnoses:  Principal Problem:   Erectile dysfunction   Past Medical History:  Diagnosis Date   Cancer ([place])    Lung cancer   COPD (chronic obstructive pulmonary disease) ([place]) 2007   EMPHYSEMA   Depression    Dyspnea    occasional - no oxygen   GERD (gastroesophageal reflux disease) 2010   Hepatitis    Alcoholic    HLD (hyperlipidemia)    no meds   Hypertension    Port-A-Cath in place 01/01/2021    Surgeries: Procedure(s): PENILE PROTHESIS INFLATABLE on 02/17/2022   Consultants (if any):   Discharged Condition: Improved  Hospital Course: [name] is an 61 y.o. male who was admitted 02/17/2022 with a diagnosis of Erectile dysfunction and went to the operating room on 02/17/2022 and underwent the above named procedures.    He was given perioperative antibiotics:  Anti-infectives (From admission, onward)    Start     Dose/Rate Route Frequency Ordered Stop   02/18/22 1330  gentamicin (GARAMYCIN) 340 mg in dextrose 5 % 100 mL IVPB        340 mg 108.5 mL/hr over 60 Minutes Intravenous  Once 02/18/22 0827 02/18/22 1314   02/18/22 0800  vancomycin (VANCOCIN) IVPB 1000 mg/200 mL premix        1,000 mg 200 mL/hr over 60 Minutes Intravenous  Once 02/17/22 1634 02/18/22 1049   02/18/22 0000  sulfamethoxazole-trimethoprim (BACTRIM DS) 800-160 MG tablet        1 tablet Oral 2 times daily 02/18/22 1016     02/17/22 1300  polymyxin 500,000 units/gentamicin 80 mg/0.9 % sodium chloride ophth. irrigation         Irrigation  Once 02/17/22 1250 02/17/22 1430   02/17/22 1042  gentamicin (GARAMYCIN) 340 mg in dextrose 5 % 100 mL IVPB        340 mg 108.5 mL/hr over 60 Minutes Intravenous 30 min pre-op 02/17/22 1042 02/18/22 1217   02/17/22 1042  vancomycin  (VANCOREADY) IVPB 1500 mg/300 mL        1,500 mg 150 mL/hr over 120 Minutes Intravenous 120 min pre-op 02/17/22 1042 02/17/22 1437     .  He was given sequential compression devices, early ambulation for DVT prophylaxis.  He benefited maximally from the hospital stay and there were no complications.    Recent vital signs:  Vitals:   02/18/22 0459 02/18/22 0815  BP: 122/79   Pulse: 90   Resp: 18   Temp: 98.7 F (37.1 C)   SpO2: 91% 95%    Recent laboratory studies:  Lab Results  Component Value Date   HGB 14.0 02/18/2022   HGB 14.8 02/17/2022   HGB 14.8 02/03/2022   Lab Results  Component Value Date   WBC 10.5 02/18/2022   PLT 226 02/18/2022   Lab Results  Component Value Date   INR 1.0 04/19/2021   Lab Results  Component Value Date   NA 137 02/18/2022   K 4.1 02/18/2022   CL 102 02/18/2022   CO2 27 02/18/2022   BUN 9 02/18/2022   CREATININE 0.63 02/18/2022   GLUCOSE 136 (H) 02/18/2022    Discharge Medications:   Allergies as of 02/18/2022       Reactions   Other Nausea Only, Other (See Comments)   No spicy foods- Patient cannot tolerate-  HEARTBURN        Medication List     STOP taking these medications    Oxycodone HCl 10 MG Tabs       TAKE these medications    acetaminophen 500 MG tablet Commonly known as: TYLENOL Take 2 tablets (1,000 mg total) by mouth every 6 (six) hours as needed. What changed: how much to take   amLODipine 5 MG tablet Commonly known as: NORVASC Take 5 mg by mouth daily.   bismuth subsalicylate 817 RN/16FB suspension Commonly known as: PEPTO BISMOL Take 30 mLs by mouth every 6 (six) hours as needed for diarrhea or loose stools or indigestion.   gabapentin 300 MG capsule Commonly known as: NEURONTIN Take 1 capsule (300 mg total) by mouth 3 (three) times daily.   metoprolol tartrate 25 MG tablet Commonly known as: LOPRESSOR Take 25 mg by mouth daily as needed.   multivitamin with minerals Tabs tablet Take  1 tablet by mouth daily. What changed: when to take this   naloxone 4 MG/0.1ML Liqd nasal spray kit Commonly known as: NARCAN Spray 0.1 mL (contents of one device) into one nostril upon signs of opioid overdose.  Call 911.  May repeat once if no response within 2-3 minutes.   Proventil HFA 108 (90 Base) MCG/ACT inhaler Generic drug: albuterol INHALE 2 PUFFS BY MOUTH EVERY 6 HOURS AS NEEDED FOR COUGHING, WHEEZING, OR SHORTNESS OF BREATH   sulfamethoxazole-trimethoprim 800-160 MG tablet Commonly known as: BACTRIM DS Take 1 tablet by mouth 2 (two) times daily.   tadalafil 5 MG tablet Commonly known as: CIALIS Take 1 tablet (5 mg total) by mouth daily as needed for erectile dysfunction.   Trelegy Ellipta 100-62.5-25 MCG/ACT Aepb Generic drug: Fluticasone-Umeclidin-Vilant Inhale 1 puff into the lungs daily in the afternoon.        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 * Tracking Code: BO * EXAM: CT CHEST WITH CONTRAST TECHNIQUE: Multidetector CT imaging of the chest was performed during intravenous contrast administration. RADIATION DOSE REDUCTION: This exam was performed according to the departmental dose-optimization program which includes automated exposure control, adjustment of the mA and/or kV according to patient size and/or use of iterative reconstruction technique. CONTRAST:  35m OMNIPAQUE IOHEXOL 300 MG/ML  SOLN COMPARISON:  07/29/2021 FINDINGS: Cardiovascular: Coronary, aortic arch, and branch vessel atherosclerotic vascular disease. Mediastinum/Nodes: Stable circumferential distal esophageal wall thickening, nonspecific although esophagitis would be a common cause. Lungs/Pleura: Centrilobular emphysema. Airway thickening is present, suggesting bronchitis or reactive airways disease. 3 mm right lower lobe pulmonary nodule on image 91 series 4, not readily apparent on the prior exam. Left upper  lobectomy. Trace left pleural effusion, reduced from previous. Upper Abdomen: Unremarkable Musculoskeletal: Unremarkable IMPRESSION: 1. 3 mm right lower lobe pulmonary nodule on image 91 series 4 is not readily seen on the prior exam. Although most likely benign, this merits surveillance. 2. No definite findings of recurrent malignancy. 3. Stable circumferential distal esophageal wall thickening, nonspecific although esophagitis would be a common cause. 4. Aortic Atherosclerosis (ICD10-I70.0) and Emphysema (ICD10-J43.9). Coronary atherosclerosis. 5. Airway thickening is present, suggesting bronchitis or reactive airways disease. 6. Trace left pleural effusion, reduced from previous. Electronically Signed   By: WVan ClinesM.D.   On: 02/04/2022 13:06    Disposition: Discharge disposition: 01-Home or Self Care       Discharge Instructions     Discharge patient   Complete by: As directed  Discharge disposition: 01-Home or Self Care   Discharge patient date: 02/18/2022        Follow-up Information     Necie Wilcoxson, Candee Furbish, MD. Call in 2 week(s).   Specialty: Urology Contact information: [address]  [place] [address] [phone]                  Signed: Nicolette Bang 03/01/2022, 10:42 AM

## 2022-03-02 DIAGNOSIS — B370 Candidal stomatitis: Secondary | ICD-10-CM | POA: Diagnosis not present

## 2022-03-02 DIAGNOSIS — R000 Tachycardia, unspecified: Secondary | ICD-10-CM | POA: Diagnosis not present

## 2022-03-02 DIAGNOSIS — R69 Illness, unspecified: Secondary | ICD-10-CM | POA: Diagnosis not present

## 2022-03-02 DIAGNOSIS — J449 Chronic obstructive pulmonary disease, unspecified: Secondary | ICD-10-CM | POA: Diagnosis not present

## 2022-03-03 ENCOUNTER — Encounter (HOSPITAL_COMMUNITY): Payer: Self-pay

## 2022-03-03 ENCOUNTER — Other Ambulatory Visit: Payer: Self-pay | Admitting: Urology

## 2022-03-03 DIAGNOSIS — C3492 Malignant neoplasm of unspecified part of left bronchus or lung: Secondary | ICD-10-CM

## 2022-03-03 NOTE — Telephone Encounter (Signed)
Please see patient request for pain medication refill

## 2022-03-04 ENCOUNTER — Other Ambulatory Visit: Payer: Self-pay | Admitting: Urology

## 2022-03-04 ENCOUNTER — Other Ambulatory Visit (HOSPITAL_COMMUNITY): Payer: Self-pay

## 2022-03-04 DIAGNOSIS — C3492 Malignant neoplasm of unspecified part of left bronchus or lung: Secondary | ICD-10-CM

## 2022-03-04 MED ORDER — GABAPENTIN 300 MG PO CAPS: 300.0000 mg | ORAL_CAPSULE | Freq: Three times a day (TID) | ORAL | 3 refills | Status: DC

## 2022-03-04 NOTE — Telephone Encounter (Signed)
Please refill is appropriate

## 2022-03-07 MED ORDER — OXYCODONE HCL 10 MG PO TABS: 10.0000 mg | ORAL_TABLET | ORAL | 0 refills | Status: DC | PRN

## 2022-03-08 DIAGNOSIS — B370 Candidal stomatitis: Secondary | ICD-10-CM | POA: Diagnosis not present

## 2022-03-08 DIAGNOSIS — K219 Gastro-esophageal reflux disease without esophagitis: Secondary | ICD-10-CM | POA: Diagnosis not present

## 2022-03-08 DIAGNOSIS — J449 Chronic obstructive pulmonary disease, unspecified: Secondary | ICD-10-CM | POA: Diagnosis not present

## 2022-03-08 DIAGNOSIS — I10 Essential (primary) hypertension: Secondary | ICD-10-CM | POA: Diagnosis not present

## 2022-03-25 ENCOUNTER — Other Ambulatory Visit: Payer: Self-pay | Admitting: Urology

## 2022-03-25 DIAGNOSIS — C3492 Malignant neoplasm of unspecified part of left bronchus or lung: Secondary | ICD-10-CM

## 2022-03-28 NOTE — Telephone Encounter (Signed)
Rx Requested

## 2022-03-29 ENCOUNTER — Telehealth: Payer: Self-pay

## 2022-03-29 NOTE — Telephone Encounter (Signed)
Patient called over the weekend requesting oxycodone refill.  He is having pain from his penile implant, patient also sent MyChart message.  Please refill if appropriate.

## 2022-03-30 ENCOUNTER — Ambulatory Visit (INDEPENDENT_AMBULATORY_CARE_PROVIDER_SITE_OTHER): Payer: Medicare HMO | Admitting: Urology

## 2022-03-30 VITALS — BP 134/67 | HR 132

## 2022-03-30 DIAGNOSIS — C3492 Malignant neoplasm of unspecified part of left bronchus or lung: Secondary | ICD-10-CM

## 2022-03-30 DIAGNOSIS — Z5189 Encounter for other specified aftercare: Secondary | ICD-10-CM

## 2022-03-30 MED ORDER — OXYCODONE HCL 10 MG PO TABS: 10.0000 mg | ORAL_TABLET | ORAL | 0 refills | Status: DC | PRN

## 2022-03-30 NOTE — Progress Notes (Signed)
03/30/2022 1:45 PM   [name] [birth year] [id number]  Referring provider: Carrolyn Meiers, MD [place],  Bryan Fleming [id number]  Followup IPP placement   HPI: [name] is a 61yo here for followup after IPP placement. He denies any testicular/scrotal pain. Incision healed well. He is here for cycling of his device   PMH: Past Medical History:  Diagnosis Date   Cancer ([place])    Lung cancer   COPD (chronic obstructive pulmonary disease) ([place]) 2007   EMPHYSEMA   Depression    Dyspnea    occasional - no oxygen   GERD (gastroesophageal reflux disease) 2010   Hepatitis    Alcoholic    HLD (hyperlipidemia)    no meds   Hypertension    Port-A-Cath in place 01/01/2021    Surgical History: Past Surgical History:  Procedure Laterality Date   BRONCHIAL BIOPSY  12/23/2020   Procedure: BRONCHIAL BIOPSIES;  Surgeon: Bryan Gobble, MD;  Location: [facility];  Service: Pulmonary;;   BRONCHIAL BRUSHINGS  12/23/2020   Procedure: BRONCHIAL BRUSHINGS;  Surgeon: Bryan Gobble, MD;  Location: [facility];  Service: Pulmonary;;   BRONCHIAL NEEDLE ASPIRATION BIOPSY  12/23/2020   Procedure: BRONCHIAL NEEDLE ASPIRATION BIOPSIES;  Surgeon: Bryan Gobble, MD;  Location: [facility];  Service: Pulmonary;;   BRONCHIAL WASHINGS  12/23/2020   Procedure: BRONCHIAL WASHINGS;  Surgeon: Bryan Gobble, MD;  Location: [facility] END[facility];  Service: Pulmonary;;   CARDIAC CATHETERIZATION  2010   COLONOSCOPY  03/14/2012   Procedure: COLONOSCOPY;  Surgeon: Bryan Houston, MD;  [facility];  Service: Endoscopy;  Laterality: N/A;  100   INTERCOSTAL NERVE BLOCK  04/21/2021   Procedure: INTERCOSTAL NERVE BLOCK;  Surgeon: Bryan Nakayama, MD;  Location: [facility];  Service: Thoracic;;   IR IMAGING GUIDED PORT INSERTION  01/12/2021   LUNG REMOVAL, PARTIAL  08/2021   PENILE PROSTHESIS IMPLANT N/A 02/17/2022   Procedure: PENILE PROTHESIS INFLATABLE;   Surgeon: Bryan Gustin, MD;  Location: [facility];  Service: Urology;  Laterality: N/A;   PORTACATH PLACEMENT  07/2021   VIDEO ASSISTED THORACOSCOPY (VATS)/ LOBECTOMY Left 04/21/2021   Procedure: VIDEO ASSISTED THORACOSCOPY (VATS)/ LOBECTOMY;  Surgeon: Bryan Nakayama, MD;  Location: [facility];  Service: Thoracic;  Laterality: Left;   VIDEO BRONCHOSCOPY WITH ENDOBRONCHIAL NAVIGATION N/A 12/23/2020   Procedure: VIDEO BRONCHOSCOPY WITH ENDOBRONCHIAL NAVIGATION;  Surgeon: Bryan Gobble, MD;  Location: [facility];  Service: Pulmonary;  Laterality: N/A;   WRIST GANGLION EXCISION  1985    Home Medications:  Allergies as of 03/30/2022       Reactions   Other Nausea Only, Other (See Comments)   No spicy foods- Patient cannot tolerate- HEARTBURN        Medication List        Accurate as of March 30, 2022  1:45 PM. If you have any questions, ask your nurse or doctor.          acetaminophen 500 MG tablet Commonly known as: TYLENOL Take 2 tablets (1,000 mg total) by mouth every 6 (six) hours as needed. What changed: how much to take   amLODipine 5 MG tablet Commonly known as: NORVASC Take 5 mg by mouth daily.   bismuth subsalicylate 595 GL/87FI suspension Commonly known as: PEPTO BISMOL Take 30 mLs by mouth every 6 (six) hours as needed for diarrhea or loose stools or indigestion.   gabapentin 300 MG capsule Commonly known as: NEURONTIN Take 1 capsule (  300 mg total) by mouth 3 (three) times daily.   metoprolol tartrate 25 MG tablet Commonly known as: LOPRESSOR Take 25 mg by mouth daily as needed.   multivitamin with minerals Tabs tablet Take 1 tablet by mouth daily. What changed: when to take this   naloxone 4 MG/0.1ML Liqd nasal spray kit Commonly known as: NARCAN Spray 0.1 mL (contents of one device) into one nostril upon signs of opioid overdose.  Call 911.  May repeat once if no response within 2-3 minutes.   Oxycodone HCl 10 MG Tabs Take 1 tablet (10 mg  total) by mouth every 4 (four) hours as needed.   Proventil HFA 108 (90 Base) MCG/ACT inhaler Generic drug: albuterol INHALE 2 PUFFS BY MOUTH EVERY 6 HOURS AS NEEDED FOR COUGHING, WHEEZING, OR SHORTNESS OF BREATH   sulfamethoxazole-trimethoprim 800-160 MG tablet Commonly known as: BACTRIM DS Take 1 tablet by mouth 2 (two) times daily.   tadalafil 5 MG tablet Commonly known as: CIALIS Take 1 tablet (5 mg total) by mouth daily as needed for erectile dysfunction.   Trelegy Ellipta 100-62.5-25 MCG/ACT Aepb Generic drug: Fluticasone-Umeclidin-Vilant Inhale 1 puff into the lungs daily in the afternoon.        Allergies:  Allergies  Allergen Reactions   Other Nausea Only and Other (See Comments)    No spicy foods- Patient cannot tolerate- HEARTBURN    Family History: Family History  Problem Relation Age of Onset   Hypertension Mother    Asthma Father    Diabetes Sister    Hypertension Sister    Hypertension Brother    Hypertension Sister    Lupus Daughter     Social History:  reports that he has been smoking cigarettes. He has a 37.00 pack-year smoking history. He has never used smokeless tobacco. He reports current alcohol use of about 56.0 standard drinks of alcohol per week. He reports that he does not use drugs.  ROS: All other review of systems were reviewed and are negative except what is noted above in HPI  Physical Exam: BP 134/67   Pulse (!) 132   Constitutional:  Alert and oriented, No acute distress. HEENT: [place] AT, moist mucus membranes.  Trachea midline, no masses. Cardiovascular: No clubbing, cyanosis, or edema. Respiratory: Normal respiratory effort, no increased work of breathing. GI: Abdomen is soft, nontender, nondistended, no abdominal masses GU: No CVA tenderness. Circumcised phallus. No masses/lesions on penis, testis, scrotum. Scrotal IPP pump in good position Lymph: No cervical or inguinal lymphadenopathy. Skin: No rashes, bruises or suspicious  lesions. Neurologic: Grossly intact, no focal deficits, moving all 4 extremities. Psychiatric: Normal mood and affect.  Laboratory Data: Lab Results  Component Value Date   WBC 10.5 02/18/2022   HGB 14.0 02/18/2022   HCT 44.1 02/18/2022   MCV 98.9 02/18/2022   PLT 226 02/18/2022    Lab Results  Component Value Date   CREATININE 0.63 02/18/2022    Lab Results  Component Value Date   PSA 0.82 08/20/2015    No results found for: "TESTOSTERONE"  Lab Results  Component Value Date   HGBA1C 5.5 06/04/2020    Urinalysis    Component Value Date/Time   COLORURINE YELLOW 04/19/2021 1510   APPEARANCEUR HAZY (A) 04/19/2021 1510   LABSPEC 1.012 04/19/2021 1510   PHURINE 5.0 04/19/2021 1510   GLUCOSEU NEGATIVE 04/19/2021 1510   HGBUR NEGATIVE 04/19/2021 1510   BILIRUBINUR NEGATIVE 04/19/2021 [place] 04/19/2021 [place] 04/19/2021 1510  UROBILINOGEN 0.2 10/28/2007 1435   NITRITE NEGATIVE 04/19/2021 [place] 04/19/2021 1510    Lab Results  Component Value Date   BACTERIA NONE SEEN 06/04/2020    Pertinent Imaging:  No results found for this or any previous visit.  No results found for this or any previous visit.  No results found for this or any previous visit.  No results found for this or any previous visit.  No results found for this or any previous visit.  No results found for this or any previous visit.  No results found for this or any previous visit.  No results found for this or any previous visit.   Assessment & Plan:    1. Visit for wound check -IPP cycled today without difficulty. Patient educated on proper cycling of the device. RTC 3 months   No follow-ups on file.  Nicolette Bang, MD  [facility] [place]

## 2022-05-02 ENCOUNTER — Ambulatory Visit (INDEPENDENT_AMBULATORY_CARE_PROVIDER_SITE_OTHER): Payer: Medicare HMO | Admitting: Urology

## 2022-05-02 VITALS — BP 156/69 | HR 114

## 2022-05-02 DIAGNOSIS — Z5189 Encounter for other specified aftercare: Secondary | ICD-10-CM

## 2022-05-02 NOTE — Progress Notes (Signed)
05/02/2022 2:02 PM   Bryan Fleming 05-10-1961 [id number]  Referring provider: Carrolyn Meiers, MD [place],  [place] [address]  No chief complaint on file.   HPI:  Bryan Fleming is a 61yo here for followup after IPP placement. He is cycling the device without issue. No scrotal or penile pain  PMH: Past Medical History:  Diagnosis Date   Cancer ([place])    Lung cancer   COPD (chronic obstructive pulmonary disease) ([place]) 2007   EMPHYSEMA   Depression    Dyspnea    occasional - no oxygen   GERD (gastroesophageal reflux disease) 2010   Hepatitis    Alcoholic    HLD (hyperlipidemia)    no meds   Hypertension    Port-A-Cath in place 01/01/2021    Surgical History: Past Surgical History:  Procedure Laterality Date   BRONCHIAL BIOPSY  12/23/2020   Procedure: BRONCHIAL BIOPSIES;  Surgeon: [name], MD;  Location: [facility];  Service: Pulmonary;;   BRONCHIAL BRUSHINGS  12/23/2020   Procedure: BRONCHIAL BRUSHINGS;  Surgeon: [name], MD;  Location: [facility];  Service: Pulmonary;;   BRONCHIAL NEEDLE ASPIRATION BIOPSY  12/23/2020   Procedure: BRONCHIAL NEEDLE ASPIRATION BIOPSIES;  Surgeon: [name], MD;  Location: [facility];  Service: Pulmonary;;   BRONCHIAL WASHINGS  12/23/2020   Procedure: BRONCHIAL WASHINGS;  Surgeon: [name], MD;  Location: [facility] ENDOSCOPY;  Service: Pulmonary;;   CARDIAC CATHETERIZATION  2010   COLONOSCOPY  03/14/2012   Procedure: COLONOSCOPY;  Surgeon: Rogene Houston, MD;  [facility];  Service: Endoscopy;  Laterality: N/A;  100   INTERCOSTAL NERVE BLOCK  04/21/2021   Procedure: INTERCOSTAL NERVE BLOCK;  Surgeon: Melrose Nakayama, MD;  Location: [facility];  Service: Thoracic;;   IR IMAGING GUIDED PORT INSERTION  01/12/2021   LUNG REMOVAL, PARTIAL  08/2021   PENILE PROSTHESIS IMPLANT N/A 02/17/2022   Procedure: PENILE PROTHESIS INFLATABLE;  Surgeon: Cleon Gustin,  MD;  [facility]: [facility];  Service: Urology;  Laterality: N/A;   PORTACATH PLACEMENT  07/2021   VIDEO ASSISTED THORACOSCOPY (VATS)/ LOBECTOMY Left 04/21/2021   Procedure: VIDEO ASSISTED THORACOSCOPY (VATS)/ LOBECTOMY;  Surgeon: Melrose Nakayama, MD;  Location: [facility];  Service: Thoracic;  Laterality: Left;   VIDEO BRONCHOSCOPY WITH ENDOBRONCHIAL NAVIGATION N/A 12/23/2020   Procedure: VIDEO BRONCHOSCOPY WITH ENDOBRONCHIAL NAVIGATION;  Surgeon: [name], MD;  Location: [facility] ENDOSCOPY;  Service: Pulmonary;  Laterality: N/A;   WRIST GANGLION EXCISION  1985    Home Medications:  Allergies as of 05/02/2022       Reactions   Other Nausea Only, Other (See Comments)   No spicy foods- Patient cannot tolerate- HEARTBURN        Medication List        Accurate as of May 02, 2022  2:02 PM. If you have any questions, ask your nurse or doctor.          acetaminophen 500 MG tablet Commonly known as: TYLENOL Take 2 tablets (1,000 mg total) by mouth every 6 (six) hours as needed. What changed: how much to take   amLODipine 5 MG tablet Commonly known as: NORVASC Take 5 mg by mouth daily.   bismuth subsalicylate 416 LA/45XM suspension Commonly known as: PEPTO BISMOL Take 30 mLs by mouth every 6 (six) hours as needed for diarrhea or loose stools or indigestion.   gabapentin 300 MG capsule Commonly known as: NEURONTIN Take 1 capsule (300 mg  total) by mouth 3 (three) times daily.   metoprolol tartrate 25 MG tablet Commonly known as: LOPRESSOR Take 25 mg by mouth daily as needed.   multivitamin with minerals Tabs tablet Take 1 tablet by mouth daily. What changed: when to take this   naloxone 4 MG/0.1ML Liqd nasal spray kit Commonly known as: NARCAN Spray 0.1 mL (contents of one device) into one nostril upon signs of opioid overdose.  Call 911.  May repeat once if no response within 2-3 minutes.   Oxycodone HCl 10 MG Tabs Take 1 tablet (10 mg total) by mouth every 4 (four)  hours as needed.   Proventil HFA 108 (90 Base) MCG/ACT inhaler Generic drug: albuterol INHALE 2 PUFFS BY MOUTH EVERY 6 HOURS AS NEEDED FOR COUGHING, WHEEZING, OR SHORTNESS OF BREATH   sulfamethoxazole-trimethoprim 800-160 MG tablet Commonly known as: BACTRIM DS Take 1 tablet by mouth 2 (two) times daily.   tadalafil 5 MG tablet Commonly known as: CIALIS Take 1 tablet (5 mg total) by mouth daily as needed for erectile dysfunction.   Trelegy Ellipta 100-62.5-25 MCG/ACT Aepb Generic drug: Fluticasone-Umeclidin-Vilant Inhale 1 puff into the lungs daily in the afternoon.        Allergies:  Allergies  Allergen Reactions   Other Nausea Only and Other (See Comments)    No spicy foods- Patient cannot tolerate- HEARTBURN    Family History: Family History  Problem Relation Age of Onset   Hypertension Mother    Asthma Father    Diabetes Sister    Hypertension Sister    Hypertension Brother    Hypertension Sister    Lupus Daughter     Social History:  reports that he has been smoking cigarettes. He has a 37.00 pack-year smoking history. He has never used smokeless tobacco. He reports current alcohol use of about 56.0 standard drinks of alcohol per week. He reports that he does not use drugs.  ROS: All other review of systems were reviewed and are negative except what is noted above in HPI  Physical Exam: BP (!) 156/69   Pulse (!) 114   Constitutional:  Alert and oriented, No acute distress. HEENT: [name], moist mucus membranes.  Trachea midline, no masses. Cardiovascular: No clubbing, cyanosis, or edema. Respiratory: Normal respiratory effort, no increased work of breathing. GI: Abdomen is soft, nontender, nondistended, no abdominal masses GU: No CVA tenderness.  Lymph: No cervical or inguinal lymphadenopathy. Skin: No rashes, bruises or suspicious lesions. Neurologic: Grossly intact, no focal deficits, moving all 4 extremities. Psychiatric: Normal mood and  affect.  Laboratory Data: Lab Results  Component Value Date   WBC 10.5 02/18/2022   HGB 14.0 02/18/2022   HCT 44.1 02/18/2022   MCV 98.9 02/18/2022   PLT 226 02/18/2022    Lab Results  Component Value Date   CREATININE 0.63 02/18/2022    Lab Results  Component Value Date   PSA 0.82 08/20/2015    No results found for: "TESTOSTERONE"  Lab Results  Component Value Date   HGBA1C 5.5 06/04/2020    Urinalysis    Component Value Date/Time   COLORURINE YELLOW 04/19/2021 1510   APPEARANCEUR HAZY (A) 04/19/2021 1510   LABSPEC 1.012 04/19/2021 1510   PHURINE 5.0 04/19/2021 1510   GLUCOSEU NEGATIVE 04/19/2021 1510   HGBUR NEGATIVE 04/19/2021 1510   BILIRUBINUR NEGATIVE 04/19/2021 1510   KETONESUR NEGATIVE 04/19/2021 1510   PROTEINUR NEGATIVE 04/19/2021 1510   UROBILINOGEN 0.2 10/28/2007 1435   NITRITE NEGATIVE 04/19/2021 1510   LEUKOCYTESUR  NEGATIVE 04/19/2021 1510    Lab Results  Component Value Date   BACTERIA NONE SEEN 06/04/2020    Pertinent Imaging:  No results found for this or any previous visit.  No results found for this or any previous visit.  No results found for this or any previous visit.  No results found for this or any previous visit.  No results found for this or any previous visit.  No valid procedures specified. No results found for this or any previous visit.  No results found for this or any previous visit.   Assessment & Plan:    1. Visit for wound check -RTC 3 months   No follow-ups on file.  Nicolette Bang, MD  [facility] [place]

## 2022-06-07 ENCOUNTER — Ambulatory Visit (HOSPITAL_COMMUNITY)
Admission: RE | Admit: 2022-06-07 | Discharge: 2022-06-07 | Disposition: A | Discharge status: Home / Self Care | Payer: Medicare HMO | Source: Ambulatory Visit | Attending: Gerontology | Admitting: Gerontology

## 2022-06-07 ENCOUNTER — Other Ambulatory Visit (HOSPITAL_COMMUNITY): Payer: Self-pay | Admitting: Gerontology

## 2022-06-07 DIAGNOSIS — J449 Chronic obstructive pulmonary disease, unspecified: Secondary | ICD-10-CM | POA: Diagnosis not present

## 2022-06-07 DIAGNOSIS — M545 Low back pain, unspecified: Secondary | ICD-10-CM | POA: Diagnosis not present

## 2022-06-07 DIAGNOSIS — M25512 Pain in left shoulder: Secondary | ICD-10-CM | POA: Insufficient documentation

## 2022-06-07 DIAGNOSIS — Z23 Encounter for immunization: Secondary | ICD-10-CM | POA: Diagnosis not present

## 2022-06-07 DIAGNOSIS — K219 Gastro-esophageal reflux disease without esophagitis: Secondary | ICD-10-CM | POA: Diagnosis not present

## 2022-06-07 DIAGNOSIS — I10 Essential (primary) hypertension: Secondary | ICD-10-CM | POA: Diagnosis not present

## 2022-08-29 ENCOUNTER — Other Ambulatory Visit: Payer: Self-pay

## 2022-08-29 ENCOUNTER — Ambulatory Visit (INDEPENDENT_AMBULATORY_CARE_PROVIDER_SITE_OTHER): Payer: Medicare HMO | Admitting: Urology

## 2022-08-29 VITALS — BP 117/69 | HR 104

## 2022-08-29 DIAGNOSIS — C3492 Malignant neoplasm of unspecified part of left bronchus or lung: Secondary | ICD-10-CM

## 2022-08-29 DIAGNOSIS — Z960 Presence of urogenital implants: Secondary | ICD-10-CM | POA: Diagnosis not present

## 2022-08-29 NOTE — Progress Notes (Signed)
08/29/2022 10:00 AM   Bryan Fleming 1960/10/08 [id number]  Referring provider: Benetta Spar, MD [address] 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 The device stopped cycling in December. The pump bulb deflates when pumping and does not refill. No testicular or penile pain   PMH: Past Medical History:  Diagnosis Date   Cancer ([facility])    Lung cancer   COPD (chronic obstructive pulmonary disease) ([facility]) 2007   EMPHYSEMA   Depression    Dyspnea    occasional - no oxygen   GERD (gastroesophageal reflux disease) 2010   Hepatitis    Alcoholic    HLD (hyperlipidemia)    no meds   Hypertension    Port-A-Cath in place 01/01/2021    Surgical History: Past Surgical History:  Procedure Laterality Date   BRONCHIAL BIOPSY  12/23/2020   Procedure: BRONCHIAL BIOPSIES;  Surgeon: Leslye Peer, MD;  Location: [facility] ENDOSCOPY;  Service: Pulmonary;;   BRONCHIAL BRUSHINGS  12/23/2020   Procedure: BRONCHIAL BRUSHINGS;  Surgeon: Leslye Peer, MD;  [facility];  Service: Pulmonary;;   BRONCHIAL NEEDLE ASPIRATION BIOPSY  12/23/2020   Procedure: BRONCHIAL NEEDLE ASPIRATION BIOPSIES;  Surgeon: Leslye Peer, MD;  Location: [facility] ENDOSCOPY;  Service: Pulmonary;;   BRONCHIAL WASHINGS  12/23/2020   Procedure: BRONCHIAL WASHINGS;  Surgeon: Leslye Peer, MD;  Location: [facility] ENDOSCOPY;  Service: Pulmonary;;   CARDIAC CATHETERIZATION  2010   COLONOSCOPY  03/14/2012   Procedure: COLONOSCOPY;  Surgeon: Malissa Hippo, MD;  [facility];  Service: Endoscopy;  Laterality: N/A;  100   INTERCOSTAL NERVE BLOCK  04/21/2021   Procedure: INTERCOSTAL NERVE BLOCK;  Surgeon: Loreli Slot, MD;  Location: [facility] OR;  Service: Thoracic;;   IR IMAGING GUIDED PORT INSERTION  01/12/2021   LUNG REMOVAL, PARTIAL  08/2021   PENILE PROSTHESIS IMPLANT N/A  02/17/2022   Procedure: PENILE PROTHESIS INFLATABLE;  Surgeon: [name], MD;  [facility]: [facility];  Service: Urology;  Laterality: N/A;   PORTACATH PLACEMENT  07/2021   VIDEO ASSISTED THORACOSCOPY (VATS)/ LOBECTOMY Left 04/21/2021   Procedure: VIDEO ASSISTED THORACOSCOPY (VATS)/ LOBECTOMY;  Surgeon: Loreli Slot, MD;  Location: [facility] OR;  Service: Thoracic;  Laterality: Left;   VIDEO BRONCHOSCOPY WITH ENDOBRONCHIAL NAVIGATION N/A 12/23/2020   Procedure: VIDEO BRONCHOSCOPY WITH ENDOBRONCHIAL NAVIGATION;  Surgeon: Leslye Peer, MD;  Location: [facility] ENDOSCOPY;  Service: Pulmonary;  Laterality: N/A;   WRIST GANGLION EXCISION  1985    Home Medications:  Allergies as of 08/29/2022       Reactions   Other Nausea Only, Other (See Comments)   No spicy foods- Patient cannot tolerate- HEARTBURN        Medication List        Accurate as of August 29, 2022 10:00 AM. If you have any questions, ask your nurse or doctor.          acetaminophen 500 MG tablet Commonly known as: TYLENOL Take 2 tablets (1,000 mg total) by mouth every 6 (six) hours as needed. What changed: how much to take   amLODipine 5 MG tablet Commonly known as: NORVASC Take 5 mg by mouth daily.   bismuth subsalicylate 262 MG/15ML suspension Commonly known as: PEPTO BISMOL Take 30 mLs by mouth every 6 (six) hours as needed for diarrhea or loose stools or indigestion.   gabapentin 300 MG capsule Commonly  known as: NEURONTIN Take 1 capsule (300 mg total) by mouth 3 (three) times daily.   metoprolol tartrate 25 MG tablet Commonly known as: LOPRESSOR Take 25 mg by mouth daily as needed.   multivitamin with minerals Tabs tablet Take 1 tablet by mouth daily. What changed: when to take this   naloxone 4 MG/0.1ML Liqd nasal spray kit Commonly known as: NARCAN Spray 0.1 mL (contents of one device) into one nostril upon signs of opioid overdose.  Call 911.  May repeat once if no response within 2-3  minutes.   Oxycodone HCl 10 MG Tabs Take 1 tablet (10 mg total) by mouth every 4 (four) hours as needed.   Proventil HFA 108 (90 Base) MCG/ACT inhaler Generic drug: albuterol INHALE 2 PUFFS BY MOUTH EVERY 6 HOURS AS NEEDED FOR COUGHING, WHEEZING, OR SHORTNESS OF BREATH   sulfamethoxazole-trimethoprim 800-160 MG tablet Commonly known as: BACTRIM DS Take 1 tablet by mouth 2 (two) times daily.   tadalafil 5 MG tablet Commonly known as: CIALIS Take 1 tablet (5 mg total) by mouth daily as needed for erectile dysfunction.   Trelegy Ellipta 100-62.5-25 MCG/ACT Aepb Generic drug: Fluticasone-Umeclidin-Vilant Inhale 1 puff into the lungs daily in the afternoon.        Allergies:  Allergies  Allergen Reactions   Other Nausea Only and Other (See Comments)    No spicy foods- Patient cannot tolerate- HEARTBURN    Family History: Family History  Problem Relation Age of Onset   Hypertension Mother    Asthma Father    Diabetes Sister    Hypertension Sister    Hypertension Brother    Hypertension Sister    Lupus Daughter     Social History:  reports that he has been smoking cigarettes. He has a 37.00 pack-year smoking history. He has never used smokeless tobacco. He reports current alcohol use of about 56.0 standard drinks of alcohol per week. He reports that he does not use drugs.  ROS: All other review of systems were reviewed and are negative except what is noted above in HPI  Physical Exam: BP 117/69   Pulse (!) 104   Constitutional:  Alert and oriented, No acute distress. HEENT: [place] AT, moist mucus membranes.  Trachea midline, no masses. Cardiovascular: No clubbing, cyanosis, or edema. Respiratory: Normal respiratory effort, no increased work of breathing. GI: Abdomen is soft, nontender, nondistended, no abdominal masses GU: No CVA tenderness. Circumcised phallus. No masses/lesions on penis, testis, scrotum. Prostate 40g smooth no nodules no induration.  Lymph: No  cervical or inguinal lymphadenopathy. Skin: No rashes, bruises or suspicious lesions. Neurologic: Grossly intact, no focal deficits, moving all 4 extremities. Psychiatric: Normal mood and affect.  Laboratory Data: Lab Results  Component Value Date   WBC 10.5 02/18/2022   HGB 14.0 02/18/2022   HCT 44.1 02/18/2022   MCV 98.9 02/18/2022   PLT 226 02/18/2022    Lab Results  Component Value Date   CREATININE 0.63 02/18/2022    Lab Results  Component Value Date   PSA 0.82 08/20/2015    No results found for: "TESTOSTERONE"  Lab Results  Component Value Date   HGBA1C 5.5 06/04/2020    Urinalysis    Component Value Date/Time   COLORURINE YELLOW 04/19/2021 1510   APPEARANCEUR HAZY (A) 04/19/2021 1510   LABSPEC 1.012 04/19/2021 1510   PHURINE 5.0 04/19/2021 1510   GLUCOSEU NEGATIVE 04/19/2021 1510   HGBUR NEGATIVE 04/19/2021 1510   BILIRUBINUR NEGATIVE 04/19/2021 1510   KETONESUR NEGATIVE 04/19/2021  1510   PROTEINUR NEGATIVE 04/19/2021 1510   UROBILINOGEN 0.2 10/28/2007 1435   NITRITE NEGATIVE 04/19/2021 1510   LEUKOCYTESUR NEGATIVE 04/19/2021 1510    Lab Results  Component Value Date   BACTERIA NONE SEEN 06/04/2020    Pertinent Imaging:  No results found for this or any previous visit.  No results found for this or any previous visit.  No results found for this or any previous visit.  No results found for this or any previous visit.  No results found for this or any previous visit.  No valid procedures specified. No results found for this or any previous visit.  No results found for this or any previous visit.   Assessment & Plan:    1. S/P insertion of penile implant We discussed the management of malfunctioning penile prosthesis including observation and revision. After discussing the treatment options the patient elects to proceed with penile prosthesis revision. Risks/benefits/alternatives discussed   No follow-ups on file.  Wilkie Aye,  MD  [facility] [place]

## 2022-08-31 ENCOUNTER — Inpatient Hospital Stay: Payer: Medicare HMO | Attending: Hematology

## 2022-08-31 ENCOUNTER — Telehealth: Payer: Self-pay

## 2022-08-31 DIAGNOSIS — Z801 Family history of malignant neoplasm of trachea, bronchus and lung: Secondary | ICD-10-CM | POA: Insufficient documentation

## 2022-08-31 DIAGNOSIS — C3412 Malignant neoplasm of upper lobe, left bronchus or lung: Secondary | ICD-10-CM | POA: Insufficient documentation

## 2022-08-31 DIAGNOSIS — C779 Secondary and unspecified malignant neoplasm of lymph node, unspecified: Secondary | ICD-10-CM | POA: Diagnosis not present

## 2022-08-31 DIAGNOSIS — F17210 Nicotine dependence, cigarettes, uncomplicated: Secondary | ICD-10-CM | POA: Insufficient documentation

## 2022-08-31 DIAGNOSIS — I10 Essential (primary) hypertension: Secondary | ICD-10-CM | POA: Diagnosis not present

## 2022-08-31 DIAGNOSIS — C3492 Malignant neoplasm of unspecified part of left bronchus or lung: Secondary | ICD-10-CM

## 2022-08-31 DIAGNOSIS — R69 Illness, unspecified: Secondary | ICD-10-CM | POA: Diagnosis not present

## 2022-08-31 DIAGNOSIS — Z800 Family history of malignant neoplasm of digestive organs: Secondary | ICD-10-CM | POA: Insufficient documentation

## 2022-08-31 DIAGNOSIS — G629 Polyneuropathy, unspecified: Secondary | ICD-10-CM | POA: Insufficient documentation

## 2022-08-31 LAB — CBC WITH DIFFERENTIAL/PLATELET
Abs Immature Granulocytes: 0.01 10*3/uL (ref 0.00–0.07)
Basophils Absolute: 0.1 10*3/uL (ref 0.0–0.1)
Basophils Relative: 1 %
Eosinophils Absolute: 0 10*3/uL (ref 0.0–0.5)
Eosinophils Relative: 0 %
HCT: 45.9 % (ref 39.0–52.0)
Hemoglobin: 15.4 g/dL (ref 13.0–17.0)
Immature Granulocytes: 0 %
Lymphocytes Relative: 31 %
Lymphs Abs: 2.7 10*3/uL (ref 0.7–4.0)
MCH: 32.4 pg (ref 26.0–34.0)
MCHC: 33.6 g/dL (ref 30.0–36.0)
MCV: 96.4 fL (ref 80.0–100.0)
Monocytes Absolute: 0.8 10*3/uL (ref 0.1–1.0)
Monocytes Relative: 9 %
Neutro Abs: 5.1 10*3/uL (ref 1.7–7.7)
Neutrophils Relative %: 59 %
Platelets: 262 10*3/uL (ref 150–400)
RBC: 4.76 MIL/uL (ref 4.22–5.81)
RDW: 13.9 % (ref 11.5–15.5)
WBC: 8.7 10*3/uL (ref 4.0–10.5)
nRBC: 0 % (ref 0.0–0.2)

## 2022-08-31 LAB — COMPREHENSIVE METABOLIC PANEL
ALT: 18 U/L (ref 0–44)
AST: 34 U/L (ref 15–41)
Albumin: 4 g/dL (ref 3.5–5.0)
Alkaline Phosphatase: 85 U/L (ref 38–126)
Anion gap: 10 (ref 5–15)
BUN: 7 mg/dL (ref 6–20)
CO2: 25 mmol/L (ref 22–32)
Calcium: 9.2 mg/dL (ref 8.9–10.3)
Chloride: 103 mmol/L (ref 98–111)
Creatinine, Ser: 0.65 mg/dL (ref 0.61–1.24)
GFR, Estimated: >60 mL/min (ref >60)
Glucose, Bld: 97 mg/dL (ref 70–99)
Potassium: 4.2 mmol/L (ref 3.5–5.1)
Sodium: 138 mmol/L (ref 135–145)
Total Bilirubin: 0.6 mg/dL (ref 0.3–1.2)
Total Protein: 8 g/dL (ref 6.5–8.1)

## 2022-08-31 LAB — MAGNESIUM: Magnesium: 2.3 mg/dL (ref 1.7–2.4)

## 2022-08-31 MED ORDER — SODIUM CHLORIDE 0.9% FLUSH
10.0000 mL | INTRAVENOUS | Status: DC | PRN
  Administered 2022-08-31: 10 mL via INTRAVENOUS

## 2022-08-31 MED ORDER — HEPARIN SOD (PORK) LOCK FLUSH 100 UNIT/ML IV SOLN
500.0000 [IU] | Freq: Once | INTRAVENOUS | Status: AC
  Administered 2022-08-31: 500 [IU] via INTRAVENOUS

## 2022-08-31 NOTE — Telephone Encounter (Signed)
I called patient to discuss potential surgery date for March 7th, 2024.  Left message to return call to office. Surgery time slot held for patient at present time.  Confirmed date with Colletta Maryland with [facility]

## 2022-08-31 NOTE — Progress Notes (Signed)
Bryan Fleming presented for Portacath access and flush. Portacath located right chest wall accessed with  H 20 needle. No blood return and no resistance met Portacath flushed with 64ml NS and 500U/42ml Heparin and needle removed intact. Procedure without incident. Patient tolerated procedure well.

## 2022-08-31 NOTE — Patient Instructions (Signed)
[facility]  Discharge Instructions: Thank you for choosing [facility] to provide your oncology and hematology care.  If you have a lab appointment with the [facility], please come in thru the Main Entrance and check in at the main information desk.  Wear comfortable clothing and clothing appropriate for easy access to any Portacath or PICC line.   We strive to give you quality time with your provider. You may need to reschedule your appointment if you arrive late (15 or more minutes).  Arriving late affects you and other patients whose appointments are after yours.  Also, if you miss three or more appointments without notifying the office, you may be dismissed from the clinic at the provider's discretion.      For prescription refill requests, have your pharmacy contact our office and allow 72 hours for refills to be completed.    Today you had your port flushed. Labs had to be drawn peripherallly   To help prevent nausea and vomiting after your treatment, we encourage you to take your nausea medication as directed.  BELOW ARE SYMPTOMS THAT SHOULD BE REPORTED IMMEDIATELY: *FEVER GREATER THAN 100.4 F (38 C) OR HIGHER *CHILLS OR SWEATING *NAUSEA AND VOMITING THAT IS NOT CONTROLLED WITH YOUR NAUSEA MEDICATION *UNUSUAL SHORTNESS OF BREATH *UNUSUAL BRUISING OR BLEEDING *URINARY PROBLEMS (pain or burning when urinating, or frequent urination) *BOWEL PROBLEMS (unusual diarrhea, constipation, pain near the anus) TENDERNESS IN MOUTH AND THROAT WITH OR WITHOUT PRESENCE OF ULCERS (sore throat, sores in mouth, or a toothache) UNUSUAL RASH, SWELLING OR PAIN  UNUSUAL VAGINAL DISCHARGE OR ITCHING   Items with * indicate a potential emergency and should be followed up as soon as possible or go to the Emergency Department if any problems should occur.  Please show the CHEMOTHERAPY ALERT CARD or IMMUNOTHERAPY ALERT CARD at check-in to the Emergency Department and  triage nurse.  Should you have questions after your visit or need to cancel or reschedule your appointment, please contact [facility] [phone]  and follow the prompts.  Office hours are 8:00 a.m. to 4:30 p.m. Monday - Friday. Please note that voicemails left after 4:00 p.m. may not be returned until the following business day.  We are closed weekends and major holidays. You have access to a nurse at all times for urgent questions. Please call the main number to the clinic [phone] and follow the prompts.  For any non-urgent questions, you may also contact your provider using MyChart. We now offer e-Visits for anyone 27 and older to request care online for non-urgent symptoms. For details visit [email or web address].   Also download the MyChart app! Go to the app store, search "MyChart", open the app, select [facility], and log in with your MyChart username and password.

## 2022-09-01 DIAGNOSIS — R0981 Nasal congestion: Secondary | ICD-10-CM | POA: Diagnosis not present

## 2022-09-01 DIAGNOSIS — J029 Acute pharyngitis, unspecified: Secondary | ICD-10-CM | POA: Diagnosis not present

## 2022-09-01 DIAGNOSIS — I10 Essential (primary) hypertension: Secondary | ICD-10-CM | POA: Diagnosis not present

## 2022-09-02 ENCOUNTER — Telehealth: Payer: Self-pay

## 2022-09-02 NOTE — Telephone Encounter (Signed)
Patient called the office today to ask about ED medications since he will have his penile prosthesis removed.  Patient not currently taking any medications for ED. Patient informed we would send Dr. Alyson Ingles a message asking for a prescription to be sent to pharmacy.

## 2022-09-02 NOTE — Telephone Encounter (Signed)
I spoke with [name]. We have discussed possible surgery dates and 10/13/2022 was agreed upon by all parties. Patient given information about surgery date, what to expect pre-operatively and post operatively.    We discussed that a pre-op nurse will be calling to set up the pre-op visit that will take place prior to surgery. Informed patient that our office will communicate any additional care to be provided after surgery.    Patients questions or concerns were discussed during our call. Advised to call our office should there be any additional information, questions or concerns that arise. Patient verbalized understanding.

## 2022-09-04 ENCOUNTER — Emergency Department (HOSPITAL_COMMUNITY)
Admission: EM | Admit: 2022-09-04 | Discharge: 2022-09-04 | Disposition: A | Discharge status: Home / Self Care | Payer: Medicare HMO | Attending: Emergency Medicine | Admitting: Emergency Medicine

## 2022-09-04 ENCOUNTER — Other Ambulatory Visit: Payer: Self-pay

## 2022-09-04 DIAGNOSIS — J029 Acute pharyngitis, unspecified: Secondary | ICD-10-CM | POA: Diagnosis present

## 2022-09-04 DIAGNOSIS — I10 Essential (primary) hypertension: Secondary | ICD-10-CM | POA: Insufficient documentation

## 2022-09-04 DIAGNOSIS — B2790 Infectious mononucleosis, unspecified without complication: Secondary | ICD-10-CM | POA: Diagnosis not present

## 2022-09-04 DIAGNOSIS — J449 Chronic obstructive pulmonary disease, unspecified: Secondary | ICD-10-CM | POA: Insufficient documentation

## 2022-09-04 LAB — MONONUCLEOSIS SCREEN: Mono Screen: POSITIVE — AB

## 2022-09-04 LAB — GROUP A STREP BY PCR: Group A Strep by PCR: NOT DETECTED

## 2022-09-04 MED ORDER — LIDOCAINE VISCOUS HCL 2 % MT SOLN
15.0000 mL | Freq: Once | OROMUCOSAL | Status: AC
  Administered 2022-09-04: 15 mL via OROMUCOSAL
  Filled 2022-09-04: qty 15

## 2022-09-04 MED ORDER — LIDOCAINE VISCOUS HCL 2 % MT SOLN: 15.0000 mL | OROMUCOSAL | 0 refills | Status: DC | PRN

## 2022-09-04 NOTE — ED Provider Notes (Signed)
[facility] Provider Note   CSN: [id number] Arrival date & time: 09/04/22  1729     History  Chief Complaint  Patient presents with   Sore Throat    Bryan Fleming is a 62 y.o. male with medical history of hypertension, hyperlipidemia, depression, COPD.  Patient presents to ED for evaluation of sore throat.  Patient reports persistent sore throat for the last 2 weeks.  Patient reports he has been taking Goody powder at home which will not treat symptoms.  Patient states he has given oral sex in the last 2 weeks to his wife, denies any symptoms for her.  Patient denies any previous testing prior to today.  Patient denies fevers, cough, nausea, vomiting or trouble swallowing.  Patient denies chest pain or shortness of breath.  Patient denies abdominal pain.   Sore Throat Pertinent negatives include no chest pain, no abdominal pain and no shortness of breath.       Home Medications Prior to Admission medications   Medication Sig Start Date End Date Taking? Authorizing Provider  lidocaine (XYLOCAINE) 2 % solution Use as directed 15 mLs in the mouth or throat as needed for mouth pain. 09/04/22  Yes Azucena Cecil, PA-C  acetaminophen (TYLENOL) 500 MG tablet Take 2 tablets (1,000 mg total) by mouth every 6 (six) hours as needed. Patient taking differently: Take 500 mg by mouth every 6 (six) hours as needed. 04/23/21   Barrett, Erin R, PA-C  amLODipine (NORVASC) 5 MG tablet Take 5 mg by mouth daily. 04/16/20   [provider]  bismuth subsalicylate (PEPTO BISMOL) 262 MG/15ML suspension Take 30 mLs by mouth every 6 (six) hours as needed for diarrhea or loose stools or indigestion.    [provider]  Fluticasone-Umeclidin-Vilant (TRELEGY ELLIPTA) 100-62.5-25 MCG/INH AEPB Inhale 1 puff into the lungs daily in the afternoon.    [provider]  gabapentin (NEURONTIN) 300 MG capsule Take 1 capsule (300 mg total) by  mouth 3 (three) times daily. 03/04/22   Harriett Rush, PA-C  metoprolol tartrate (LOPRESSOR) 25 MG tablet Take 25 mg by mouth daily as needed. 07/20/21   [provider]  Multiple Vitamin (MULTIVITAMIN WITH MINERALS) TABS tablet Take 1 tablet by mouth daily. Patient taking differently: Take 1 tablet by mouth 2 (two) times a week. 06/07/20   Johnson, Clanford L, MD  naloxone [facility]) nasal spray 4 mg/0.1 mL Spray 0.1 mL (contents of one device) into one nostril upon signs of opioid overdose.  Call 911.  May repeat once if no response within 2-3 minutes. 04/14/21   Harriett Rush, PA-C  Oxycodone HCl 10 MG TABS Take 1 tablet (10 mg total) by mouth every 4 (four) hours as needed. 03/30/22   [name], Candee Furbish, MD  PROVENTIL HFA 108 (90 Base) MCG/ACT inhaler INHALE 2 PUFFS BY MOUTH EVERY 6 HOURS AS NEEDED FOR COUGHING, WHEEZING, OR SHORTNESS OF BREATH 11/13/17   Soyla Dryer, PA-C  sulfamethoxazole-trimethoprim (BACTRIM DS) 800-160 MG tablet Take 1 tablet by mouth 2 (two) times daily. 02/18/22   [name], Candee Furbish, MD  prochlorperazine (COMPAZINE) 10 MG tablet TAKE 1 TABLET BY MOUTH EVERY 6 HOURS AS NEEDED FOR NAUSEA FOR VOMITING 08/16/21 01/15/22  Derek Jack, MD      Allergies    Other    Review of Systems   Review of Systems  Constitutional:  Negative for fever.  HENT:  Positive for sore throat. Negative for trouble swallowing.  Respiratory:  Negative for cough and shortness of breath.   Cardiovascular:  Negative for chest pain.  Gastrointestinal:  Negative for abdominal pain, nausea and vomiting.  All other systems reviewed and are negative.   Physical Exam Updated Vital Signs BP (!) 155/78   Pulse 93   Temp 98.1 F (36.7 C) (Oral)   Resp 18   Ht 5\' 8"  (1.727 m)   Wt 68.9 kg   SpO2 100%   BMI 23.11 kg/m  Physical Exam Vitals and nursing note reviewed.  Constitutional:      General: He is not in acute distress.    Appearance: Normal appearance. He  is not ill-appearing, toxic-appearing or diaphoretic.  HENT:     Head: Normocephalic and atraumatic.     Nose: Nose normal. No congestion.     Mouth/Throat:     Mouth: Mucous membranes are moist.     Pharynx: Oropharyngeal exudate and posterior oropharyngeal erythema present.     Tonsils: Tonsillar exudate present. 1+ on the right. 1+ on the left.     Comments: Patient posterior oropharynx with exudate, uvula midline, handling secretions appropriately, no sign of RPA or PTA Eyes:     Extraocular Movements: Extraocular movements intact.     Conjunctiva/sclera: Conjunctivae normal.     Pupils: Pupils are equal, round, and reactive to light.  Cardiovascular:     Rate and Rhythm: Normal rate and regular rhythm.  Pulmonary:     Effort: Pulmonary effort is normal.     Breath sounds: Normal breath sounds. No wheezing.  Abdominal:     General: Abdomen is flat. Bowel sounds are normal.     Palpations: Abdomen is soft. There is no splenomegaly.     Tenderness: There is no abdominal tenderness.  Musculoskeletal:     Cervical back: Normal range of motion and neck supple. No tenderness.  Lymphadenopathy:     Cervical: No cervical adenopathy.  Skin:    General: Skin is warm and dry.     Capillary Refill: Capillary refill takes less than 2 seconds.  Neurological:     Mental Status: He is alert and oriented to person, place, and time.     ED Results / Procedures / Treatments   Labs (all labs ordered are listed, but only abnormal results are displayed) Labs Reviewed  MONONUCLEOSIS SCREEN - Abnormal; Notable for the following components:      Result Value   Mono Screen POSITIVE (*)    All other components within normal limits  GROUP A STREP BY PCR  GC/CHLAMYDIA PROBE AMP ([place]) NOT AT [facility]    EKG None  Radiology No results found.  Procedures Procedures    Medications Ordered in ED Medications  lidocaine (XYLOCAINE) 2 % viscous mouth solution 15 mL (15 mLs Mouth/Throat  Given 09/04/22 1809)    ED Course/ Medical Decision Making/ A&P                           Medical Decision Making Amount and/or Complexity of Data Reviewed Labs: ordered.  Risk Prescription drug management.   62 year old male presents to the ED for evaluation of sore throat.  Please see HPI for further details.  On exam patient posterior oropharynx is erythematous with exudate.  Uvula is midline, no drooling, no change in phonation, no sign of RPA or PTA.  Patient strep testing ordered in triage most likely due to exudate negative.  Mononucleosis screen, GC/chlamydia screen added on.  Patient reports recent oral sex with his wife.  Mononucleosis screen positive.  Patient denies abdominal pain, no splenomegaly on physical examination.  The patient be discharged home and advised to treat symptoms conservatively.  Patient advised to avoid contact sports.  Patient advised to return to the ED with any new or worsening signs or symptoms.  Patient voiced understanding with instructions.  Patient stable for discharge.   Final Clinical Impression(s) / ED Diagnoses Final diagnoses:  Infectious mononucleosis without complication, infectious mononucleosis due to unspecified organism    Rx / DC Orders ED Discharge Orders          Ordered    lidocaine (XYLOCAINE) 2 % solution  As needed        09/04/22 2002              Lawana Chambers 09/04/22 2003    Fredia Sorrow, MD 09/04/22 2318

## 2022-09-04 NOTE — ED Triage Notes (Signed)
Pt c/o sore throat x 2 weeks. Some nasal drainage noted every now and again. No n/v and afebrile.

## 2022-09-04 NOTE — Discharge Instructions (Addendum)
Return to the ED with any new or worsening signs or symptoms Read attached guide concerning infectious mononucleosis Please pick up prescription for viscous lidocaine sent in Please avoid contact sports. Please treat symptoms at home conservatively.  He may take ibuprofen or Tylenol for sore throat.  Please pick up viscous lidocaine abstinent.

## 2022-09-05 ENCOUNTER — Encounter: Payer: Self-pay | Admitting: Urology

## 2022-09-05 NOTE — Patient Instructions (Signed)
Penile Prosthesis Implantation Penile prosthesis implantation is a procedure to place a device into the penis. The device is used to treat problems with having or keeping an erection (erectile dysfunction). There are two main types of devices that can be put in during the procedure: malleable semi-rigid penile implants and inflatable (hydraulic) penile implants. Malleable semi-rigid penile implant A malleable semi-rigid penile implant, also called a non-hydraulic penile implant, consists of two silicone rubber rods. The rods provide some hardness (rigidity). They are also flexible, so the penis can curve downward in its normal position and become straight for sex. Inflatable penile implant  An inflatable penile implant, also called a hydraulic penile implant, consists of two- or three-piece inflatable devices. These devices include one or two cylinders in the shaft of the penis, a pump located in the scrotum, and a part that stores water in the abdomen (abdominal reservoir). The cylinders are inflated with normal salt water (saline) that helps to create an erection, and they can be deflated after sex. There are several types of inflatable implants. Tell a health care provider about: Any allergies you have. All medicines you are taking, including vitamins, herbs, eye drops, creams, and over-the-counter medicines. Any problems you or family members have had with anesthetic medicines. Any bleeding problems you have. Any surgeries you have had. Any medical conditions you have. What are the risks? Generally, this is a safe procedure. However, problems may occur, including: Infection in the penis. If this happens, the implant may need to be removed. Allergic reactions to medicines. Damage to nearby structures or organs, such as to the urethra. This is the part of the body that drains urine from the bladder. Not enough blood reaching the penis. This is rare. If this happens, the implant will need to be  removed. What happens before the procedure? When to stop eating and drinking Follow instructions from your health care provider about what you may eat and drink before your procedure. These may include: 8 hours before your procedure Stop eating most foods. Do not eat meat, fried foods, or fatty foods. Eat only light foods, such as toast or crackers. All liquids are okay except energy drinks and alcohol. 6 hours before your procedure Stop eating. Drink only clear liquids, such as water, clear fruit juice, black coffee, plain tea, and sports drinks. Do not drink energy drinks or alcohol. 2 hours before your procedure Stop drinking all liquids. You may be allowed to take medicines with small sips of water. If you do not follow your health care provider's instructions, your procedure may be delayed or canceled. Medicines Ask your health care provider about: Changing or stopping your regular medicines. This is especially important if you are taking diabetes medicines or blood thinners. Taking medicines such as aspirin and ibuprofen. These medicines can thin your blood. Do not take these medicines unless your health care provider tells you to take them. Taking over-the-counter medicines, vitamins, herbs, and supplements. General instructions Do not use any products that contain nicotine or tobacco for at least 4 weeks before the procedure. These products include cigarettes, chewing tobacco, and vaping devices, such as e-cigarettes. If you need help quitting, ask your health care provider. If you will be going home right after the procedure, plan to have a responsible adult: Take you home from the hospital or clinic. You will not be allowed to drive. Care for you for the time you are told. Ask your health care provider: How your surgery site will be marked. What  steps will be taken to help prevent infection. These steps may include: Removing hair at the surgery site. Washing skin with a  germ-killing soap. Taking antibiotic medicine. What happens during the procedure? An IV will be inserted into one of your veins. You will be given one or more of the following: A medicine to help you relax (sedative). A medicine to make you fall asleep (general anesthetic). A medicine that is injected into your spine to numb the area below and slightly above the injection site (spinal anesthetic). A small, thin tube called a catheter may be inserted through the tip of your penis and into your urethra and bladder. The catheter drains urine during the procedure and helps your surgeon easily locate your urethra. A small incision will be made in your scrotum or in your penis, just below the head of your penis. The rods or cylinders of the prosthesis will be placed into the shaft of your penis. For an inflatable penile implant: Incisions will also be made in your abdomen and scrotum. The reservoir and pump will be inserted in these incisions. The cylinders, reservoir, and pump will be joined by tubes and tested to ensure the fluid flows from one area to the other. Your incision will be closed with dissolvable stitches (sutures). Skin glue or adhesive strips may also be used to close your incision. A bandage (dressing) will be applied to your incision. You may be fitted with a device similar to a jock strap or underwear with a supportive pouch (scrotal support) to relieve pressure on the incision area. The procedure may vary among health care providers and hospitals. What happens after the procedure? Your blood pressure, heart rate, breathing rate, and blood oxygen level will be monitored until you leave the hospital or clinic. If you have a catheter in place, it may stay in place until the day after the procedure. You may be given antibiotics and pain medicines as needed. A towel roll or an ice pack may be placed under your scrotum to help reduce swelling. If you were given a sedative during the  procedure, it can affect you for several hours. Do not drive or operate machinery until your health care provider says that it is safe. Summary Penile prosthesis implantation is a procedure to place a device into the penis. The device is used to treat problems with having or keeping an erection (erectile dysfunction). There are two main types of penile implants that can be put in during this procedure: malleable semi-rigid types and inflatable types. After the procedure, you may be fitted with a device similar to a jock strap or underwear with a supportive pouch (scrotal support) to relieve pressure on the incision area. This information is not intended to replace advice given to you by your health care provider. Make sure you discuss any questions you have with your health care provider. Document Revised: 06/23/2021 Document Reviewed: 06/23/2021 Elsevier Patient Education  [place].

## 2022-09-07 ENCOUNTER — Inpatient Hospital Stay (HOSPITAL_BASED_OUTPATIENT_CLINIC_OR_DEPARTMENT_OTHER): Payer: Medicare HMO | Admitting: Hematology

## 2022-09-07 VITALS — BP 156/94 | HR 97 | Temp 99.2°F | Resp 19 | Ht 68.0 in | Wt 159.7 lb

## 2022-09-07 DIAGNOSIS — C779 Secondary and unspecified malignant neoplasm of lymph node, unspecified: Secondary | ICD-10-CM | POA: Diagnosis not present

## 2022-09-07 DIAGNOSIS — I10 Essential (primary) hypertension: Secondary | ICD-10-CM | POA: Diagnosis not present

## 2022-09-07 DIAGNOSIS — C3412 Malignant neoplasm of upper lobe, left bronchus or lung: Secondary | ICD-10-CM | POA: Diagnosis not present

## 2022-09-07 DIAGNOSIS — G629 Polyneuropathy, unspecified: Secondary | ICD-10-CM | POA: Diagnosis not present

## 2022-09-07 DIAGNOSIS — Z801 Family history of malignant neoplasm of trachea, bronchus and lung: Secondary | ICD-10-CM | POA: Diagnosis not present

## 2022-09-07 DIAGNOSIS — Z800 Family history of malignant neoplasm of digestive organs: Secondary | ICD-10-CM | POA: Diagnosis not present

## 2022-09-07 DIAGNOSIS — C3492 Malignant neoplasm of unspecified part of left bronchus or lung: Secondary | ICD-10-CM | POA: Diagnosis not present

## 2022-09-07 DIAGNOSIS — R69 Illness, unspecified: Secondary | ICD-10-CM | POA: Diagnosis not present

## 2022-09-07 NOTE — Progress Notes (Signed)
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  PRIOR THERAPY: carboplatin, paclitaxel and nivolumab q21d x3 cycles  NGS Results: not done  CURRENT THERAPY: surveillance  BRIEF ONCOLOGIC HISTORY:  Oncology History  Primary lung squamous cell carcinoma, left ([place])  12/31/2020 Cancer Staging   Staging form: Lung, AJCC 8th Edition - Clinical stage from 12/31/2020: Stage IIIA (cT2b, cN2, cM0) - Signed by Derek Jack, MD on 01/01/2021 Stage prefix: Initial diagnosis   01/01/2021 Initial Diagnosis   Primary lung squamous cell carcinoma, left ([place])   01/13/2021 - 02/24/2021 Chemotherapy   Patient is on Treatment Plan : LUNG NSCLC Carboplatin / Paclitaxel q21d x 3 cycles       CANCER STAGING:  Cancer Staging  Primary lung squamous cell carcinoma, left ([place]) Staging form: Lung, AJCC 8th Edition - Clinical stage from 12/31/2020: Stage IIIA (cT2b, cN2, cM0) - Signed by Derek Jack, MD on 01/01/2021   INTERVAL HISTORY:  Bryan Fleming, a 62 y.o. 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 He had a LUL lobectomy done by Dr. Modesto Charon on 04/21/2021.  He was last seen by me on 08/05/2021.  Today, he states that he is doing well overall. His appetite level is at 85%. His energy level is at 80%. He reports unchanged tingling in his hands and feet. He denies any chest pain or irritation at the port site. He notes chronic, unchanging cough and shortness of breath secondary to his COPD. He is still smoking  1ppd, sometimes less.  He no longer sees Dr. Roxan Hockey for surgical follow up.  He was treated in the ED on 09/04/22 for mononucleosis. He was given a Rx for viscous Lidocaine at that time.   REVIEW OF SYSTEMS:  Review of Systems  Constitutional:  Negative for chills, fatigue and fever.  HENT:   Positive for trouble swallowing. Negative for lump/mass, mouth sores, nosebleeds and sore throat.   Respiratory:  Positive for cough and shortness of breath.   Cardiovascular:  Negative for chest pain, leg swelling and palpitations.  Gastrointestinal:  Negative for abdominal pain, constipation, diarrhea, nausea and vomiting.  Genitourinary:  Negative for bladder incontinence, difficulty urinating, dysuria, frequency, hematuria and nocturia.   Musculoskeletal:  Negative for arthralgias, back pain, flank pain, myalgias and neck pain.  Skin:  Negative for itching and rash.  Neurological:  Positive for numbness (tingling in hands and feet). Negative for dizziness and headaches.  Hematological:  Does not bruise/bleed easily.  Psychiatric/Behavioral:  Negative for depression, sleep disturbance and suicidal ideas. The patient is not nervous/anxious.   All other systems reviewed and are negative.   PAST MEDICAL/SURGICAL HISTORY:  Past Medical History:  Diagnosis Date   Cancer [facility])    Lung cancer   COPD (chronic obstructive pulmonary disease) ([place]) 2007   EMPHYSEMA   Depression    Dyspnea    occasional - no oxygen   GERD (gastroesophageal reflux disease) 2010   Hepatitis    Alcoholic    HLD (hyperlipidemia)    no meds   Hypertension    Port-A-Cath  in place 01/01/2021   Past Surgical History:  Procedure Laterality Date   BRONCHIAL BIOPSY  12/23/2020   Procedure: BRONCHIAL BIOPSIES;  Surgeon: [name], MD;  Location: [facility] ENDOSCOPY;  Service: Pulmonary;;   BRONCHIAL BRUSHINGS  12/23/2020   Procedure: BRONCHIAL BRUSHINGS;  Surgeon: [name], MD;  Location: [facility] ENDOSCOPY;   Service: Pulmonary;;   BRONCHIAL NEEDLE ASPIRATION BIOPSY  12/23/2020   Procedure: BRONCHIAL NEEDLE ASPIRATION BIOPSIES;  Surgeon: [name], MD;  Location: [place];  Service: Pulmonary;;   BRONCHIAL WASHINGS  12/23/2020   Procedure: BRONCHIAL WASHINGS;  Surgeon: [name], MD;  Location: [facility] ENDOSCOPY;  Service: Pulmonary;;   CARDIAC CATHETERIZATION  2010   COLONOSCOPY  03/14/2012   Procedure: COLONOSCOPY;  Surgeon: Rogene [place], MD;  Location[facility] ENDO [facility];  Service: Endoscopy;  Laterality: N/A;  100   INTERCOSTAL NERVE BLOCK  04/21/2021   Procedure: INTERCOSTAL NERVE BLOCK;  Surgeon: Melrose Nakayama, MD;  Location: [place];  Service: Thoracic;;   IR IMAGING GUIDED PORT INSERTION  01/12/2021   LUNG REMOVAL, PARTIAL  08/2021   PENILE PROSTHESIS IMPLANT N/A 02/17/2022   Procedure: PENILE PROTHESIS INFLATABLE;  Surgeon: Cleon Gustin, MD;  Location: [facility] ORS;  Service: Urology;  Laterality: N/A;   PORTACATH PLACEMENT  07/2021   VIDEO ASSISTED THORACOSCOPY (VATS)/ LOBECTOMY Left 04/21/2021   Procedure: VIDEO ASSISTED THORACOSCOPY (VATS)/ LOBECTOMY;  Surgeon: Melrose Nakayama, MD;  Location: [place];  Service: Thoracic;  Laterality: Left;   VIDEO BRONCHOSCOPY WITH ENDOBRONCHIAL NAVIGATION N/A 12/23/2020   Procedure: VIDEO BRONCHOSCOPY WITH ENDOBRONCHIAL NAVIGATION;  Surgeon: [name], MD;  Location: [place] ENDOSCOPY;  Service: Pulmonary;  Laterality: N/A;   WRIST GANGLION EXCISION  1985    SOCIAL HISTORY:  Social History   Socioeconomic History   Marital status: Widowed    Spouse name: Not on file   Number of children: Not on file   Years of education: Not on file   Highest education level: Not on file  Occupational History   Not on file  Tobacco Use   Smoking status: Every Day    Packs/day: 1.00    Years: 37.00    Total pack years: 37.00    Types: Cigarettes   Smokeless tobacco: Never   Tobacco comments:    smokes 1/2 pack a day [facility] 03/08/21   Vaping Use   Vaping Use: Never used  Substance and Sexual Activity   Alcohol use: Yes    Alcohol/week: 56.0 standard drinks of alcohol    Types: 35 Cans of beer, 21 Shots of liquor per week    Comment: 5-6 beers and 2-3 shots daily-04/19/21   Drug use: No   Sexual activity: Yes  Other Topics Concern   Not on file  Social History Narrative   Not on file   Social Determinants of Health   Financial Resource Strain: Not on file  Food Insecurity: Not on file  Transportation Needs: No Transportation Needs (12/03/2020)   PRAPARE - Hydrologist (Medical): No    Lack of Transportation (Non-Medical): No  Physical Activity: Inactive (12/03/2020)   Exercise Vital Sign    Days of Exercise per Week: 0 days    Minutes of Exercise per Session: 0 min  Stress: Not on file  Social Connections: Not on file  Intimate Partner Violence: Not At Risk (12/03/2020)   Humiliation, Afraid, Rape, and Kick questionnaire    Fear of Current or Ex-Partner: No  Emotionally Abused: No    Physically Abused: No    Sexually Abused: No    FAMILY HISTORY:  Family History  Problem Relation Age of Onset   Hypertension Mother    Asthma Father    Diabetes Sister    Hypertension Sister    Hypertension Brother    Hypertension Sister    Lupus Daughter     CURRENT MEDICATIONS:  Current Outpatient Medications  Medication Sig Dispense Refill   acetaminophen (TYLENOL) 500 MG tablet Take 2 tablets (1,000 mg total) by mouth every 6 (six) hours as needed. (Patient taking differently: Take 500 mg by mouth every 6 (six) hours as needed.) 30 tablet 0   amLODipine (NORVASC) 5 MG tablet Take 5 mg by mouth daily.     bismuth subsalicylate (PEPTO BISMOL) 262 MG/15ML suspension Take 30 mLs by mouth every 6 (six) hours as needed for diarrhea or loose stools or indigestion.     Fluticasone-Umeclidin-Vilant (TRELEGY ELLIPTA) 100-62.5-25 MCG/INH AEPB Inhale 1 puff into the lungs daily in the  afternoon.     gabapentin (NEURONTIN) 300 MG capsule Take 1 capsule (300 mg total) by mouth 3 (three) times daily. 90 capsule 3   lidocaine (XYLOCAINE) 2 % solution Use as directed 15 mLs in the mouth or throat as needed for mouth pain. 100 mL 0   metoprolol tartrate (LOPRESSOR) 25 MG tablet Take 25 mg by mouth daily as needed.     Multiple Vitamin (MULTIVITAMIN WITH MINERALS) TABS tablet Take 1 tablet by mouth daily. (Patient taking differently: Take 1 tablet by mouth 2 (two) times a week.)     naloxone (NARCAN) nasal spray 4 mg/0.1 mL Spray 0.1 mL (contents of one device) into one nostril upon signs of opioid overdose.  Call 911.  May repeat once if no response within 2-3 minutes. 2 each 0   Oxycodone HCl 10 MG TABS Take 1 tablet (10 mg total) by mouth every 4 (four) hours as needed. 30 tablet 0   PROVENTIL HFA 108 (90 Base) MCG/ACT inhaler INHALE 2 PUFFS BY MOUTH EVERY 6 HOURS AS NEEDED FOR COUGHING, WHEEZING, OR SHORTNESS OF BREATH 20.1 g 1   sulfamethoxazole-trimethoprim (BACTRIM DS) 800-160 MG tablet Take 1 tablet by mouth 2 (two) times daily. 14 tablet 0   No current facility-administered medications for this visit.    ALLERGIES:  Allergies  Allergen Reactions   Other Nausea Only and Other (See Comments)    No spicy foods- Patient cannot tolerate- HEARTBURN    PHYSICAL EXAM:  Performance status (ECOG): 1 - Symptomatic but completely ambulatory  Vitals:   09/07/22 1528  BP: (!) 156/94  Pulse: 97  Resp: 19  Temp: 99.2 F (37.3 C)  SpO2: 100%    Wt Readings from Last 3 Encounters:  09/07/22 72.4 kg (159 lb 11.2 oz)  09/04/22 68.9 kg (152 lb)  02/17/22 64.5 kg (142 lb 3.2 oz)   Physical Exam Vitals and nursing note reviewed. Exam conducted with a chaperone present.  Constitutional:      Appearance: Normal appearance.  Cardiovascular:     Rate and Rhythm: Normal rate and regular rhythm.     Pulses: Normal pulses.     Heart sounds: Normal heart sounds.  Pulmonary:      Effort: Pulmonary effort is normal.     Breath sounds: Normal breath sounds.  Abdominal:     Palpations: Abdomen is soft. There is no hepatomegaly, splenomegaly or mass.     Tenderness: There is no abdominal tenderness.  Musculoskeletal:     Right lower leg: No edema.     Left lower leg: No edema.  Lymphadenopathy:     Cervical: No cervical adenopathy.     Right cervical: No superficial cervical adenopathy.    Left cervical: No superficial cervical adenopathy.     Upper Body:     Right upper body: No supraclavicular, axillary or pectoral adenopathy.     Left upper body: No supraclavicular, axillary or pectoral adenopathy.  Neurological:     General: No focal deficit present.     Mental Status: He is alert and oriented to person, place, and time.  Psychiatric:        Mood and Affect: Mood normal.        Behavior: Behavior normal.      LABORATORY DATA:  I have reviewed the labs as listed.     Latest Ref Rng & Units 08/31/2022    2:00 PM 02/18/2022    4:25 AM 02/17/2022    4:51 PM  CBC  WBC 4.0 - 10.5 K/uL 8.7  10.5  8.4   Hemoglobin 13.0 - 17.0 g/dL 15.4  14.0  14.8   Hematocrit 39.0 - 52.0 % 45.9  44.1  44.8   Platelets 150 - 400 K/uL 262  226  240       Latest Ref Rng & Units 08/31/2022    2:00 PM 02/18/2022    4:25 AM 02/17/2022    4:51 PM  CMP  Glucose 70 - 99 mg/dL 97  136  136   BUN 6 - 20 mg/dL 7  9  5    Creatinine 0.61 - 1.24 mg/dL 0.65  0.63  0.57   Sodium 135 - 145 mmol/L 138  137  135   Potassium 3.5 - 5.1 mmol/L 4.2  4.1  4.3   Chloride 98 - 111 mmol/L 103  102  104   CO2 22 - 32 mmol/L 25  27  23    Calcium 8.9 - 10.3 mg/dL 9.2  8.6  8.5   Total Protein 6.5 - 8.1 g/dL 8.0     Total Bilirubin 0.3 - 1.2 mg/dL 0.6     Alkaline Phos 38 - 126 U/L 85     AST 15 - 41 U/L 34     ALT 0 - 44 U/L 18       DIAGNOSTIC IMAGING:  I have independently reviewed the scans and discussed with the patient. No results found.   CT Chest W Contrast 02/03/2022 IMPRESSION: 1.  3 mm right lower lobe pulmonary nodule on image 91 series 4 is not readily seen on the prior exam. Although most likely benign, this merits surveillance. 2. No definite findings of recurrent malignancy. 3. Stable circumferential distal esophageal wall thickening, nonspecific although esophagitis would be a common cause. 4. Aortic Atherosclerosis (ICD10-I70.0) and Emphysema (ICD10-J43.9). Coronary atherosclerosis. 5. Airway thickening is present, suggesting bronchitis or reactive airways disease. 6. Trace left pleural effusion, reduced from previous.   ASSESSMENT:  1.  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 Halford Chessman for left upper lobe lung mass. - Is current active smoker, 1/2 pack/day for 35 years. - CT lung cancer screening scan on 10/16/2020 showed cavitary mass in the anterior left upper lobe measuring 4.4 x 3.0 x 3.5 cm with 2.5 cm solid nodular component inferomedially.  This abuts and involves the anterior pleura/chest wall.  Expansile lytic lesion in the right posterolateral ninth and 10th  ribs (unchanged dating back to CT from 06/16/2016).  10 mm short axis AP window node suspicious for nodal metastasis. - No change in baseline cough.  No hemoptysis.  No chest pain. - Brain MRI was negative for metastatic disease. - PET scan on 12/14/2020 showed left upper lobe mass measuring 3.9 x 4 cm with SUV 17.1.  Some contiguous hypermetabolism along the left chest wall including anterolateral left third rib with SUV 5.4.  No gross osseous destruction.  AP window lymph node of 9 mm, mildly hypermetabolic, SUV 2.8. - On 12/23/15 left upper lobe brushing and lavage consistent with squamous cell carcinoma. - Left upper lobe fine-needle aspiration was also consistent with squamous cell carcinoma.  Lymph node could not be reached. - Caris testing with limited tissue showed MMR proficient, BRAF V6 energy negative, PD-L1 negative by 22 C3, 28-8, SP142.   (IHC/CISH results should be interpreted with caution given the potential for false negative results) - 3 cycles of carboplatin, paclitaxel and nivolumab from 01/13/2021 through 02/24/2021 based on Checkmate-816. - Left upper lobectomy and lymph node resection on 04/21/2021. - Pathology consistent with invasive moderately differentiated squamous cell carcinoma, 5 cm, margins negative.  Visceral pleura not involved.  Metastatic squamous cell carcinoma in 1/2 hilar lymph nodes.  Levels 5, 11, 12, 10, 11 and 12 lymph nodes were negative.  No definitive tumor response identified.  No lymphovascular invasion.  ypT2b, ypN1. - Caris testing on resection sample from 04/21/2021 shows PD-L1 TPS 1%.  No targetable mutations.  MSI stable.  TMB low.   2.  Social/family history: - Previously worked in [employer] fund[employer], currently on disability. - Current active smoker, 1/2 pack/day for 35 years. - Drinks 6 pack of beers along with 1 quart of liquor every day. - 1 brother had throat cancer and another brother had lung cancer.   PLAN:  1.  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 - He denies any new symptoms including chest pains, cough or shortness of breath. - Labs from 08/31/2022 shows normal LFTs and CBC. - Last CT scan from 02/03/2022 showed 3 mm right lower lobe nodule, new, most likely benign.  No other evidence of recurrence. - I have recommended repeating a CT scan of the chest with contrast on follow-up.   2.  Neuropathy: -Numbness in the fingertips and toes is stable.  Continue gabapentin 300 mg 3 times daily.   Orders placed this encounter:  No orders of the defined types were placed in this encounter.   I,Alexis Herring,acting as a Education administrator for [employer], MD.,have documented all relevant documentation on the behalf of Derek Jack, MD,as directed by  Derek Jack, MD while in the presence of Derek Jack, MD.  I, Derek Jack MD, have reviewed the above documentation for accuracy and completeness, and I agree with the above.    Derek Jack, MD [place] [phone]

## 2022-09-07 NOTE — Patient Instructions (Addendum)
Bryan Fleming  Discharge Instructions  You were seen and examined today by Dr. Delton Coombes.  Dr. Delton Coombes discussed your most recent lab work which revealed everything looks good.  Dr. Delton Coombes is going to schedule you for a repeat Chest CT scan.  Follow-up as scheduled after CT chest.    Thank you for choosing [facility] to provide your oncology and hematology care.   To afford each patient quality time with our provider, please arrive at least 15 minutes before your scheduled appointment time. You may need to reschedule your appointment if you arrive late (10 or more minutes). Arriving late affects you and other patients whose appointments are after yours.  Also, if you miss three or more appointments without notifying the office, you may be dismissed from the clinic at the provider's discretion.    Again, thank you for choosing [facility].  Our hope is that these requests will decrease the amount of time that you wait before being seen by our physicians.   If you have a lab appointment with the [facility] please come in thru the Main Entrance and check in at the main information desk.           _____________________________________________________________  Should you have questions after your visit to [facility], please contact our office at [phone] and follow the prompts.  Our office hours are 8:00 a.m. to 4:30 p.m. Monday - Thursday and 8:00 a.m. to 2:30 p.m. Friday.  Please note that voicemails left after 4:00 p.m. may not be returned until the following business day.  We are closed weekends and all major holidays.  You do have access to a nurse 24-7, just call the main number to the clinic [phone] and do not press any options, hold on the line and a nurse will answer the phone.    For prescription refill requests, have your pharmacy contact our office and allow 72 hours.    Masks are  optional in the cancer centers. If you would like for your care team to wear a mask while they are taking care of you, please let them know. You may have one support person who is at least 61 years old accompany you for your appointments.

## 2022-09-07 NOTE — ED Notes (Signed)
Spoke with Dr. Gilford Raid and informed her pt had wrong swab performed for the GC/chlymdia test; she stated to call pt and inform him he can come back to be re swabbed per his preference  Call pt no answer and no vm left due to risk of HIPAA violations

## 2022-09-08 NOTE — Telephone Encounter (Signed)
Mychart message sent to patient.

## 2022-09-12 DIAGNOSIS — R7989 Other specified abnormal findings of blood chemistry: Secondary | ICD-10-CM | POA: Diagnosis not present

## 2022-09-12 DIAGNOSIS — Z1331 Encounter for screening for depression: Secondary | ICD-10-CM | POA: Diagnosis not present

## 2022-09-12 DIAGNOSIS — R69 Illness, unspecified: Secondary | ICD-10-CM | POA: Diagnosis not present

## 2022-09-12 DIAGNOSIS — Z1389 Encounter for screening for other disorder: Secondary | ICD-10-CM | POA: Diagnosis not present

## 2022-09-12 DIAGNOSIS — Z0001 Encounter for general adult medical examination with abnormal findings: Secondary | ICD-10-CM | POA: Diagnosis not present

## 2022-09-12 DIAGNOSIS — E785 Hyperlipidemia, unspecified: Secondary | ICD-10-CM | POA: Diagnosis not present

## 2022-09-12 DIAGNOSIS — F17210 Nicotine dependence, cigarettes, uncomplicated: Secondary | ICD-10-CM | POA: Diagnosis not present

## 2022-09-12 DIAGNOSIS — N529 Male erectile dysfunction, unspecified: Secondary | ICD-10-CM | POA: Diagnosis not present

## 2022-09-12 DIAGNOSIS — K709 Alcoholic liver disease, unspecified: Secondary | ICD-10-CM | POA: Diagnosis not present

## 2022-09-12 DIAGNOSIS — E559 Vitamin D deficiency, unspecified: Secondary | ICD-10-CM | POA: Diagnosis not present

## 2022-09-12 DIAGNOSIS — I10 Essential (primary) hypertension: Secondary | ICD-10-CM | POA: Diagnosis not present

## 2022-09-12 DIAGNOSIS — R0981 Nasal congestion: Secondary | ICD-10-CM | POA: Diagnosis not present

## 2022-09-12 DIAGNOSIS — F17200 Nicotine dependence, unspecified, uncomplicated: Secondary | ICD-10-CM | POA: Diagnosis not present

## 2022-09-12 DIAGNOSIS — I700 Atherosclerosis of aorta: Secondary | ICD-10-CM | POA: Diagnosis not present

## 2022-09-12 DIAGNOSIS — K219 Gastro-esophageal reflux disease without esophagitis: Secondary | ICD-10-CM | POA: Diagnosis not present

## 2022-09-12 DIAGNOSIS — Z125 Encounter for screening for malignant neoplasm of prostate: Secondary | ICD-10-CM | POA: Diagnosis not present

## 2022-09-13 ENCOUNTER — Telehealth: Payer: Self-pay

## 2022-09-13 NOTE — Telephone Encounter (Signed)
     Patient  visit on 1/28  at [facility] you been able to follow up with your primary care physician? Yes   The patient was or was not able to obtain any needed medicine or equipment. Yes   Are there diet recommendations that you are having difficulty following? Na   Patient expresses understanding of discharge instructions and education provided has no other needs at this time.  Yes      [facility] [phone] [address], [place], [place] [address] Phone: [phone] Email: [email or web address]

## 2022-09-15 ENCOUNTER — Ambulatory Visit (HOSPITAL_COMMUNITY)
Admission: RE | Admit: 2022-09-15 | Discharge: 2022-09-15 | Disposition: A | Discharge status: Home / Self Care | Payer: Medicare HMO | Source: Ambulatory Visit | Attending: Hematology | Admitting: Hematology

## 2022-09-15 DIAGNOSIS — C3490 Malignant neoplasm of unspecified part of unspecified bronchus or lung: Secondary | ICD-10-CM | POA: Diagnosis not present

## 2022-09-15 DIAGNOSIS — C3492 Malignant neoplasm of unspecified part of left bronchus or lung: Secondary | ICD-10-CM | POA: Diagnosis not present

## 2022-09-15 DIAGNOSIS — J432 Centrilobular emphysema: Secondary | ICD-10-CM | POA: Diagnosis not present

## 2022-09-15 MED ORDER — IOHEXOL 300 MG/ML  SOLN
75.0000 mL | Freq: Once | INTRAMUSCULAR | Status: AC | PRN
  Administered 2022-09-15: 75 mL via INTRAVENOUS

## 2022-09-26 ENCOUNTER — Inpatient Hospital Stay: Payer: Medicare HMO | Attending: Hematology | Admitting: Hematology

## 2022-09-26 VITALS — BP 125/79 | HR 115 | Temp 98.0°F | Resp 19 | Ht 69.0 in | Wt 157.8 lb

## 2022-09-26 DIAGNOSIS — F17210 Nicotine dependence, cigarettes, uncomplicated: Secondary | ICD-10-CM | POA: Insufficient documentation

## 2022-09-26 DIAGNOSIS — C3412 Malignant neoplasm of upper lobe, left bronchus or lung: Secondary | ICD-10-CM

## 2022-09-26 DIAGNOSIS — Z800 Family history of malignant neoplasm of digestive organs: Secondary | ICD-10-CM | POA: Diagnosis not present

## 2022-09-26 DIAGNOSIS — I10 Essential (primary) hypertension: Secondary | ICD-10-CM | POA: Insufficient documentation

## 2022-09-26 DIAGNOSIS — Z85118 Personal history of other malignant neoplasm of bronchus and lung: Secondary | ICD-10-CM | POA: Insufficient documentation

## 2022-09-26 DIAGNOSIS — R69 Illness, unspecified: Secondary | ICD-10-CM | POA: Diagnosis not present

## 2022-09-26 DIAGNOSIS — C3492 Malignant neoplasm of unspecified part of left bronchus or lung: Secondary | ICD-10-CM

## 2022-09-26 DIAGNOSIS — G629 Polyneuropathy, unspecified: Secondary | ICD-10-CM | POA: Diagnosis not present

## 2022-09-26 DIAGNOSIS — T451X5A Adverse effect of antineoplastic and immunosuppressive drugs, initial encounter: Secondary | ICD-10-CM

## 2022-09-26 DIAGNOSIS — Z801 Family history of malignant neoplasm of trachea, bronchus and lung: Secondary | ICD-10-CM | POA: Insufficient documentation

## 2022-09-26 MED ORDER — DULOXETINE HCL 30 MG PO CPEP: 60.0000 mg | ORAL_CAPSULE | Freq: Every day | ORAL | 3 refills | Status: DC

## 2022-09-26 NOTE — Patient Instructions (Signed)
[facility]  Discharge Instructions  You were seen and examined today by Dr. Delton Coombes.  Dr. Delton Coombes discussed your most recent lab work and  scan which revealed that everything looks good except your pancreas duct is enlarged.  Dr. Delton Coombes recommends having a CT scan of your abdomen to see what is going on with the pancreas.   Start taking the Cymbalta 30 mg once a day for the first week. You can then increase to 60 mg if you are able to tolerate it to help with your numbness and tingling.  Follow-up as scheduled after scan.    Thank you for choosing [facility] to provide your oncology and hematology care.   To afford each patient quality time with our provider, please arrive at least 15 minutes before your scheduled appointment time. You may need to reschedule your appointment if you arrive late (10 or more minutes). Arriving late affects you and other patients whose appointments are after yours.  Also, if you miss three or more appointments without notifying the office, you may be dismissed from the clinic at the provider's discretion.    Again, thank you for choosing [facility].  Our hope is that these requests will decrease the amount of time that you wait before being seen by our physicians.   If you have a lab appointment with the [facility] please come in thru the Main Entrance and check in at the main information desk.           _____________________________________________________________  Should you have questions after your visit to [facility], please contact our office at [phone] and follow the prompts.  Our office hours are 8:00 a.m. to 4:30 p.m. Monday - Thursday and 8:00 a.m. to 2:30 p.m. Friday.  Please note that voicemails left after 4:00 p.m. may not be returned until the following business day.  We are closed weekends and all major holidays.  You do have access to a nurse  24-7, just call the main number to the clinic [phone] and do not press any options, hold on the line and a nurse will answer the phone.    For prescription refill requests, have your pharmacy contact our office and allow 72 hours.    Masks are optional in the cancer centers. If you would like for your care team to wear a mask while they are taking care of you, please let them know. You may have one support person who is at least 61 years old accompany you for your appointments.

## 2022-09-26 NOTE — Progress Notes (Signed)
[name] [address], [place] [address]   Clinic Day:  09/26/2022  Referring physician: Carrolyn Meiers*  Patient Care Team: Carrolyn Meiers, MD as PCP - General (Internal Medicine) Josue Hector, MD as PCP - Cardiology (Cardiology) Brien Mates, RN as Oncology Nurse Navigator (Oncology) Derek Jack, MD as Medical Oncologist (Medical Oncology)   ASSESSMENT & PLAN:   Assessment: 1.  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 Halford Chessman for left upper lobe lung mass. - Is current active smoker, 1/2 pack/day for 35 years. - CT lung cancer screening scan on 10/16/2020 showed cavitary mass in the anterior left upper lobe measuring 4.4 x 3.0 x 3.5 cm with 2.5 cm solid nodular component inferomedially.  This abuts and involves the anterior pleura/chest wall.  Expansile lytic lesion in the right posterolateral ninth and 10th ribs (unchanged dating back to CT from 06/16/2016).  10 mm short axis AP window node suspicious for nodal metastasis. - No change in baseline cough.  No hemoptysis.  No chest pain. - Brain MRI was negative for metastatic disease. - PET scan on 12/14/2020 showed left upper lobe mass measuring 3.9 x 4 cm with SUV 17.1.  Some contiguous hypermetabolism along the left chest wall including anterolateral left third rib with SUV 5.4.  No gross osseous destruction.  AP window lymph node of 9 mm, mildly hypermetabolic, SUV 2.8. - On 2/95/1884 left upper lobe brushing and lavage consistent with squamous cell carcinoma. - Left upper lobe fine-needle aspiration was also consistent with squamous cell carcinoma.  Lymph node could not be reached. - Caris testing with limited tissue showed MMR proficient, BRAF V6 energy negative, PD-L1 negative by 22 C3, 28-8, SP142.  (IHC/CISH results should be interpreted with caution given the potential for false negative results) - 3 cycles of carboplatin,  paclitaxel and nivolumab from 01/13/2021 through 02/24/2021 based on [id number]. - Left upper lobectomy and lymph node resection on 04/21/2021. - Pathology consistent with invasive moderately differentiated squamous cell carcinoma, 5 cm, margins negative.  Visceral pleura not involved.  Metastatic squamous cell carcinoma in 1/2 hilar lymph nodes.  Levels 5, 11, 12, 10, 11 and 12 lymph nodes were negative.  No definitive tumor response identified.  No lymphovascular invasion.  ypT2b, ypN1. - Caris testing on resection sample from 04/21/2021 shows PD-L1 TPS 1%.  No targetable mutations.  MSI stable.  TMB low.   2.  Social/family history: - Previously worked in [employer] fund[employer], currently on disability. - Current active smoker, 1/2 pack/day for 35 years. - Drinks 6 pack of beers along with 1 quart of liquor every day. - 1 brother had throat cancer and another brother had lung cancer.  Plan: 1.  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  However there is incidental finding of pancreatic ductal dilatation as seen on 07/29/2021 but new from May 2022. - Based on these findings recommend CT abdomen with pancreatic protocol. - RTC after above.    2.  Neuropathy: - He reports numbness in the fingertips and toes and tingling in the feet.  Feet feel cold.  He is no longer taking gabapentin as it did not seem to work. - I will start him on Cymbalta 30 mg daily for the first week followed by 60 mg daily.   Orders placed this encounter:  No orders of the defined types were placed in this encounter.  Orders Placed This Encounter  Procedures   CT ABDOMEN W WO CONTRAST    Pancreatic protocol    Standing Status:   Future    Standing Expiration Date:   09/27/2023    Order Specific Question:   If indicated for the ordered procedure, I authorize the administration of contrast media per Radiology protocol     Answer:   Yes    Order Specific Question:   Does the patient have a contrast media/X-ray dye allergy?    Answer:   No    Order Specific Question:   Preferred imaging location?    Answer:   [facility]    Order Specific Question:   Release to patient    Answer:   Immediate [1]    Order Specific Question:   Is Oral Contrast requested for this exam?    Answer:   Yes, Per Radiology protocol      Kaleen Odea as a scribe for Derek Jack, MD.,have documented all relevant documentation on the behalf of Derek Jack, MD,as directed by  Derek Jack, MD while in the presence of Derek Jack, MD.   I, Derek Jack MD, have reviewed the above documentation for accuracy and completeness, and I agree with the above.   Derek Jack, MD   2/19/20245:59 PM  CHIEF COMPLAINT/PURPOSE OF CONSULT:   Diagnosis: bronchogenic carcnioma in left upper lobe of lung    Cancer Staging  Primary lung squamous cell carcinoma, left ([place]) Staging form: Lung, AJCC 8th Edition - Clinical stage from 12/31/2020: Stage IIIA (cT2b, cN2, cM0) - Signed by Derek Jack, MD on 01/01/2021    Prior Therapy: carboplatin, paclitaxel and nivolumab q21d x3 cycles   Current Therapy: Surveillance   HISTORY OF PRESENT ILLNESS:   Oncology History  Primary lung squamous cell carcinoma, left ([place])  12/31/2020 Cancer Staging   Staging form: Lung, AJCC 8th Edition - Clinical stage from 12/31/2020: Stage IIIA (cT2b, cN2, cM0) - Signed by Derek Jack, MD on 01/01/2021 Stage prefix: Initial diagnosis   01/01/2021 Initial Diagnosis   Primary lung squamous cell carcinoma, left ([place])   01/13/2021 - 02/24/2021 Chemotherapy   Patient is on Treatment Plan : LUNG NSCLC Carboplatin / Paclitaxel q21d x 3 cycles         Bryan Fleming is a 62 y.o. male presenting to clinic today for evaluation of bronchogenic carcnioma in left upper lobe of lung  at the request of  09/07/2022.  Today, he states that he is doing well overall. His appetite level is at 70%. His energy level is at 70%. He denies any new problems/issues. He denies having abdominal pain.He continue to have numbness sin his fingers and tingling in her feet. The feeling doesn't come at a particular time of the day.    PAST MEDICAL HISTORY:   Past Medical History: Past Medical History:  Diagnosis Date   Cancer ([place])    Lung cancer   COPD (chronic obstructive pulmonary disease) ([place]) 2007   EMPHYSEMA   Depression    Dyspnea    occasional - no oxygen   GERD (gastroesophageal reflux disease) 2010   Hepatitis    Alcoholic    HLD (hyperlipidemia)    no meds   Hypertension    Port-A-Cath in place 01/01/2021    Surgical History: Past Surgical History:  Procedure Laterality Date   BRONCHIAL BIOPSY  12/23/2020   Procedure: BRONCHIAL BIOPSIES;  Surgeon: Baltazar Apo  S, MD;  Location: [facility];  Service: Pulmonary;;   BRONCHIAL BRUSHINGS  12/23/2020   Procedure: BRONCHIAL BRUSHINGS;  Surgeon: [name], MD;  Location: [facility] ENDOSCOPY;  Service: Pulmonary;;   BRONCHIAL NEEDLE ASPIRATION BIOPSY  12/23/2020   Procedure: BRONCHIAL NEEDLE ASPIRATION BIOPSIES;  Surgeon: [name], MD;  Location: [facility] ENDOSCOPY;  Service: Pulmonary;;   BRONCHIAL WASHINGS  12/23/2020   Procedure: BRONCHIAL WASHINGS;  Surgeon: [name], MD;  Location: [facility] ENDOSCOPY;  Service: Pulmonary;;   CARDIAC CATHETERIZATION  2010   COLONOSCOPY  03/14/2012   Procedure: COLONOSCOPY;  Surgeon: Rogene Houston, MD;  [facility];  Service: Endoscopy;  Laterality: N/A;  100   INTERCOSTAL NERVE BLOCK  04/21/2021   Procedure: INTERCOSTAL NERVE BLOCK;  Surgeon: Melrose Nakayama, MD;  Location: [facility];  Service: Thoracic;;   IR IMAGING GUIDED PORT INSERTION  01/12/2021   LUNG REMOVAL, PARTIAL  08/2021   PENILE PROSTHESIS IMPLANT N/A 02/17/2022   Procedure: PENILE PROTHESIS INFLATABLE;  Surgeon:  Cleon Gustin, MD;  Location: [facility] ORS;  Service: Urology;  Laterality: N/A;   PORTACATH PLACEMENT  07/2021   VIDEO ASSISTED THORACOSCOPY (VATS)/ LOBECTOMY Left 04/21/2021   Procedure: VIDEO ASSISTED THORACOSCOPY (VATS)/ LOBECTOMY;  Surgeon: Melrose Nakayama, MD;  Location: [facility];  Service: Thoracic;  Laterality: Left;   VIDEO BRONCHOSCOPY WITH ENDOBRONCHIAL NAVIGATION N/A 12/23/2020   Procedure: VIDEO BRONCHOSCOPY WITH ENDOBRONCHIAL NAVIGATION;  Surgeon: [name], MD;  Location: [facility] ENDOSCOPY;  Service: Pulmonary;  Laterality: N/A;   WRIST GANGLION EXCISION  1985    Social History: Social History   Socioeconomic History   Marital status: Widowed    Spouse name: Not on file   Number of children: Not on file   Years of education: Not on file   Highest education level: Not on file  Occupational History   Not on file  Tobacco Use   Smoking status: Every Day    Packs/day: 1.00    Years: 37.00    Total pack years: 37.00    Types: Cigarettes   Smokeless tobacco: Never   Tobacco comments:    smokes 1/2 pack a day [facility] 03/08/21  Vaping Use   Vaping Use: Never used  Substance and Sexual Activity   Alcohol use: Yes    Alcohol/week: 56.0 standard drinks of alcohol    Types: 35 Cans of beer, 21 Shots of liquor per week    Comment: 5-6 beers and 2-3 shots daily-04/19/21   Drug use: No   Sexual activity: Yes  Other Topics Concern   Not on file  Social History Narrative   Not on file   Social Determinants of Health   Financial Resource Strain: Not on file  Food Insecurity: Not on file  Transportation Needs: No Transportation Needs (12/03/2020)   PRAPARE - Hydrologist (Medical): No    Lack of Transportation (Non-Medical): No  Physical Activity: Inactive (12/03/2020)   Exercise Vital Sign    Days of Exercise per Week: 0 days    Minutes of Exercise per Session: 0 min  Stress: Not on file  Social Connections: Not on file  Intimate Partner  Violence: Not At Risk (12/03/2020)   Humiliation, Afraid, Rape, and Kick questionnaire    Fear of Current or Ex-Partner: No    Emotionally Abused: No    Physically Abused: No    Sexually Abused: No    Family History: Family History  Problem Relation Age  of Onset   Hypertension Mother    Asthma Father    Diabetes Sister    Hypertension Sister    Hypertension Brother    Hypertension Sister    Lupus Daughter     Current Medications:  Current Outpatient Medications:    acetaminophen (TYLENOL) 500 MG tablet, Take 2 tablets (1,000 mg total) by mouth every 6 (six) hours as needed. (Patient taking differently: Take 500 mg by mouth every 6 (six) hours as needed.), Disp: 30 tablet, Rfl: 0   amLODipine (NORVASC) 5 MG tablet, Take 5 mg by mouth daily., Disp: , Rfl:    bismuth subsalicylate (PEPTO BISMOL) 262 MG/15ML suspension, Take 30 mLs by mouth every 6 (six) hours as needed for diarrhea or loose stools or indigestion., Disp: , Rfl:    DULoxetine (CYMBALTA) 30 MG capsule, Take 2 capsules (60 mg total) by mouth daily., Disp: 60 capsule, Rfl: 3   Fluticasone-Umeclidin-Vilant (TRELEGY ELLIPTA) 100-62.5-25 MCG/INH AEPB, Inhale 1 puff into the lungs daily in the afternoon., Disp: , Rfl:    gabapentin (NEURONTIN) 300 MG capsule, Take 1 capsule (300 mg total) by mouth 3 (three) times daily., Disp: 90 capsule, Rfl: 3   lidocaine (XYLOCAINE) 2 % solution, Use as directed 15 mLs in the mouth or throat as needed for mouth pain., Disp: 100 mL, Rfl: 0   metoprolol tartrate (LOPRESSOR) 25 MG tablet, Take 25 mg by mouth daily as needed., Disp: , Rfl:    Multiple Vitamin (MULTIVITAMIN WITH MINERALS) TABS tablet, Take 1 tablet by mouth daily. (Patient taking differently: Take 1 tablet by mouth 2 (two) times a week.), Disp: , Rfl:    naloxone (NARCAN) nasal spray 4 mg/0.1 mL, Spray 0.1 mL (contents of one device) into one nostril upon signs of opioid overdose.  Call 911.  May repeat once if no response within  2-3 minutes., Disp: 2 each, Rfl: 0   Oxycodone HCl 10 MG TABS, Take 1 tablet (10 mg total) by mouth every 4 (four) hours as needed., Disp: 30 tablet, Rfl: 0   PROVENTIL HFA 108 (90 Base) MCG/ACT inhaler, INHALE 2 PUFFS BY MOUTH EVERY 6 HOURS AS NEEDED FOR COUGHING, WHEEZING, OR SHORTNESS OF BREATH, Disp: 20.1 g, Rfl: 1   sulfamethoxazole-trimethoprim (BACTRIM DS) 800-160 MG tablet, Take 1 tablet by mouth 2 (two) times daily., Disp: 14 tablet, Rfl: 0   Allergies: Allergies  Allergen Reactions   Other Nausea Only and Other (See Comments)    No spicy foods- Patient cannot tolerate- HEARTBURN    REVIEW OF SYSTEMS:   Review of Systems  Constitutional:  Negative for chills, fatigue and fever.  HENT:   Negative for lump/mass, mouth sores, nosebleeds, sore throat and trouble swallowing.   Eyes:  Negative for eye problems.  Respiratory:  Positive for cough and shortness of breath.   Cardiovascular:  Negative for chest pain, leg swelling and palpitations.  Gastrointestinal:  Negative for abdominal pain, constipation, diarrhea, nausea and vomiting.  Genitourinary:  Negative for bladder incontinence, difficulty urinating, dysuria, frequency, hematuria and nocturia.   Musculoskeletal:  Negative for arthralgias, back pain, flank pain, myalgias and neck pain.  Skin:  Negative for itching and rash.  Neurological:  Positive for numbness. Negative for dizziness and headaches.  Hematological:  Does not bruise/bleed easily.  Psychiatric/Behavioral:  Negative for depression, sleep disturbance (Fingers and feet) and suicidal ideas. The patient is not nervous/anxious.   All other systems reviewed and are negative.    VITALS:   Blood pressure 125/79, pulse Marland Kitchen)  115, temperature 98 F (36.7 C), temperature source Tympanic, resp. rate 19, height 5\' 9"  (1.753 m), weight 157 lb 12.8 oz (71.6 kg), SpO2 97 %.  Wt Readings from Last 3 Encounters:  09/26/22 157 lb 12.8 oz (71.6 kg)  09/07/22 159 lb 11.2 oz (72.4  kg)  09/04/22 152 lb (68.9 kg)    Body mass index is 23.3 kg/m.  Performance status (ECOG): 1 - Symptomatic but completely ambulatory  PHYSICAL EXAM:   Physical Exam Vitals and nursing note reviewed. Exam conducted with a chaperone present.  Constitutional:      Appearance: Normal appearance.  Cardiovascular:     Rate and Rhythm: Normal rate and regular rhythm.     Pulses: Normal pulses.     Heart sounds: Normal heart sounds.  Pulmonary:     Effort: Pulmonary effort is normal.     Breath sounds: Normal breath sounds.  Abdominal:     Palpations: Abdomen is soft. There is no hepatomegaly, splenomegaly or mass.     Tenderness: There is no abdominal tenderness.  Musculoskeletal:     Right lower leg: No edema.     Left lower leg: No edema.  Lymphadenopathy:     Cervical: No cervical adenopathy.     Right cervical: No superficial, deep or posterior cervical adenopathy.    Left cervical: No superficial, deep or posterior cervical adenopathy.     Upper Body:     Right upper body: No supraclavicular or axillary adenopathy.     Left upper body: No supraclavicular or axillary adenopathy.  Neurological:     General: No focal deficit present.     Mental Status: He is alert and oriented to person, place, and time.  Psychiatric:        Mood and Affect: Mood normal.        Behavior: Behavior normal.     LABS:      Latest Ref Rng & Units 08/31/2022    2:00 PM 02/18/2022    4:25 AM 02/17/2022    4:51 PM  CBC  WBC 4.0 - 10.5 K/uL 8.7  10.5  8.4   Hemoglobin 13.0 - 17.0 g/dL 15.4  14.0  14.8   Hematocrit 39.0 - 52.0 % 45.9  44.1  44.8   Platelets 150 - 400 K/uL 262  226  240       Latest Ref Rng & Units 08/31/2022    2:00 PM 02/18/2022    4:25 AM 02/17/2022    4:51 PM  CMP  Glucose 70 - 99 mg/dL 97  136  136   BUN 6 - 20 mg/dL 7  9  5    Creatinine 0.61 - 1.24 mg/dL 0.65  0.63  0.57   Sodium 135 - 145 mmol/L 138  137  135   Potassium 3.5 - 5.1 mmol/L 4.2  4.1  4.3   Chloride  98 - 111 mmol/L 103  102  104   CO2 22 - 32 mmol/L 25  27  23    Calcium 8.9 - 10.3 mg/dL 9.2  8.6  8.5   Total Protein 6.5 - 8.1 g/dL 8.0     Total Bilirubin 0.3 - 1.2 mg/dL 0.6     Alkaline Phos 38 - 126 U/L 85     AST 15 - 41 U/L 34     ALT 0 - 44 U/L 18        No results found for: "CEA1", "CEA" / No results found for: "CEA1", "CEA" No results found  for: "PSA1" No results found for: "GMW102" No results found for: "CAN125"  No results found for: "TOTALPROTELP", "ALBUMINELP", "A1GS", "A2GS", "BETS", "BETA2SER", "GAMS", "MSPIKE", "SPEI" No results found for: "TIBC", "FERRITIN", "IRONPCTSAT" No results found for: "LDH"   STUDIES:   CT Chest W Contrast  Result Date: 09/16/2022 CLINICAL DATA:  Non-small cell lung cancer.  * Tracking Code: BO * EXAM: CT CHEST WITH CONTRAST TECHNIQUE: Multidetector CT imaging of the chest was performed during intravenous contrast administration. RADIATION DOSE REDUCTION: This exam was performed according to the departmental dose-optimization program which includes automated exposure control, adjustment of the mA and/or kV according to patient size and/or use of iterative reconstruction technique. CONTRAST:  68mL OMNIPAQUE IOHEXOL 300 MG/ML  SOLN COMPARISON:  02/03/2022, 07/29/2021 and PET 12/14/2020. FINDINGS: Cardiovascular: Right IJ Port-A-Cath terminates in the SVC. Atherosclerotic calcification of the aorta and coronary arteries. Heart size normal. Left ventricle may be somewhat hypertrophied. No pericardial effusion. Mediastinum/Nodes: No pathologically enlarged mediastinal lymph nodes. Right hilar lymph node measures 1.3 cm, stable. No left hilar or axillary adenopathy. Esophagus is grossly unremarkable. Lungs/Pleura: Centrilobular and paraseptal emphysema. 1 mm right lower lobe nodule (7/81), decreased from 3 mm on 02/03/2022. Left upper lobectomy. Lungs are otherwise clear. No pleural fluid. Debris is seen in the airway. Upper Abdomen: Visualized portions  of the liver and gallbladder are unremarkable. Slight nodular thickening of the lateral limb right adrenal gland and thickening of the lateral limb left adrenal gland. No specific follow-up necessary. Visualized portions of the kidneys and spleen are unremarkable. Pancreatic ductal dilatation, measuring up to 5 mm, as on 07/29/2021 but new from PET 12/14/2020. Pancreas is incompletely visualized. Visualized portions of the stomach and bowel are grossly unremarkable. Musculoskeletal: Degenerative changes in the spine. Old bilateral rib fractures. No worrisome lytic or sclerotic lesions. IMPRESSION: 1. No evidence of recurrent or metastatic disease. 2. Pancreatic ductal dilatation, as on 07/29/2021 but new from 12/14/2020. Recommend CT abdomen with contrast in further evaluation, as an obstructing pancreatic lesion cannot be excluded, as clinically indicated. 3. Aortic atherosclerosis (ICD10-I70.0). Coronary artery calcification. 4.  Emphysema (ICD10-J43.9). Electronically Signed   By: Lorin Picket M.D.   On: 09/16/2022 11:18

## 2022-10-05 ENCOUNTER — Ambulatory Visit (HOSPITAL_COMMUNITY): Admission: RE | Admit: 2022-10-05 | Payer: Medicare HMO | Source: Ambulatory Visit

## 2022-10-10 ENCOUNTER — Encounter (HOSPITAL_COMMUNITY)
Admission: RE | Admit: 2022-10-10 | Discharge: 2022-10-10 | Disposition: A | Discharge status: Home / Self Care | Payer: Medicare HMO | Source: Ambulatory Visit | Attending: Urology | Admitting: Urology

## 2022-10-10 ENCOUNTER — Other Ambulatory Visit: Payer: Self-pay

## 2022-10-10 ENCOUNTER — Encounter (HOSPITAL_COMMUNITY): Payer: Self-pay

## 2022-10-11 ENCOUNTER — Ambulatory Visit: Payer: Medicare HMO | Admitting: Hematology

## 2022-10-11 DIAGNOSIS — I10 Essential (primary) hypertension: Secondary | ICD-10-CM | POA: Diagnosis not present

## 2022-10-11 DIAGNOSIS — K219 Gastro-esophageal reflux disease without esophagitis: Secondary | ICD-10-CM | POA: Diagnosis not present

## 2022-10-12 MED ORDER — GENTAMICIN SULFATE 40 MG/ML IJ SOLN
5.0000 mg/kg | INTRAVENOUS | Status: AC
  Administered 2022-10-13: 360 mg via INTRAVENOUS
  Filled 2022-10-12: qty 9

## 2022-10-12 MED ORDER — VANCOMYCIN HCL 1500 MG/300ML IV SOLN
1500.0000 mg | INTRAVENOUS | Status: AC
  Administered 2022-10-13: 1500 mg via INTRAVENOUS
  Filled 2022-10-12: qty 300

## 2022-10-13 ENCOUNTER — Other Ambulatory Visit: Payer: Self-pay

## 2022-10-13 ENCOUNTER — Ambulatory Visit (HOSPITAL_BASED_OUTPATIENT_CLINIC_OR_DEPARTMENT_OTHER): Payer: Medicare HMO | Admitting: Certified Registered"

## 2022-10-13 ENCOUNTER — Observation Stay (HOSPITAL_COMMUNITY)
Admission: RE | Admit: 2022-10-13 | Discharge: 2022-10-14 | Disposition: A | Discharge status: Home / Self Care | Payer: Medicare HMO | Attending: Urology | Admitting: Urology

## 2022-10-13 ENCOUNTER — Ambulatory Visit (HOSPITAL_COMMUNITY): Payer: Medicare HMO | Admitting: Certified Registered"

## 2022-10-13 ENCOUNTER — Encounter (HOSPITAL_COMMUNITY): Payer: Self-pay | Admitting: Urology

## 2022-10-13 ENCOUNTER — Encounter (HOSPITAL_COMMUNITY)
Admission: RE | Disposition: A | Discharge status: Home / Self Care | Payer: Self-pay | Source: Home / Self Care | Attending: Urology

## 2022-10-13 DIAGNOSIS — Z85118 Personal history of other malignant neoplasm of bronchus and lung: Secondary | ICD-10-CM | POA: Diagnosis not present

## 2022-10-13 DIAGNOSIS — T83490A Other mechanical complication of penile (implanted) prosthesis, initial encounter: Secondary | ICD-10-CM | POA: Diagnosis not present

## 2022-10-13 DIAGNOSIS — T83410A Breakdown (mechanical) of penile (implanted) prosthesis, initial encounter: Secondary | ICD-10-CM | POA: Diagnosis not present

## 2022-10-13 DIAGNOSIS — I10 Essential (primary) hypertension: Secondary | ICD-10-CM | POA: Insufficient documentation

## 2022-10-13 DIAGNOSIS — J449 Chronic obstructive pulmonary disease, unspecified: Secondary | ICD-10-CM | POA: Diagnosis not present

## 2022-10-13 DIAGNOSIS — Y828 Other medical devices associated with adverse incidents: Secondary | ICD-10-CM | POA: Diagnosis not present

## 2022-10-13 DIAGNOSIS — R69 Illness, unspecified: Secondary | ICD-10-CM | POA: Diagnosis not present

## 2022-10-13 DIAGNOSIS — N529 Male erectile dysfunction, unspecified: Secondary | ICD-10-CM | POA: Diagnosis present

## 2022-10-13 DIAGNOSIS — F17210 Nicotine dependence, cigarettes, uncomplicated: Secondary | ICD-10-CM | POA: Insufficient documentation

## 2022-10-13 HISTORY — PX: REMOVAL OF PENILE PROSTHESIS: SHX6059

## 2022-10-13 SURGERY — REMOVAL, PENILE PROSTHESIS
Anesthesia: General | Site: Penis

## 2022-10-13 MED ORDER — ONDANSETRON HCL 4 MG/2ML IJ SOLN
INTRAMUSCULAR | Status: DC | PRN
  Administered 2022-10-13: 4 mg via INTRAVENOUS

## 2022-10-13 MED ORDER — ROCURONIUM BROMIDE 10 MG/ML (PF) SYRINGE
PREFILLED_SYRINGE | INTRAVENOUS | Status: DC | PRN
  Administered 2022-10-13: 10 mg via INTRAVENOUS
  Administered 2022-10-13: 60 mg via INTRAVENOUS

## 2022-10-13 MED ORDER — FENTANYL CITRATE (PF) 250 MCG/5ML IJ SOLN
INTRAMUSCULAR | Status: AC
  Filled 2022-10-13: qty 5

## 2022-10-13 MED ORDER — SODIUM CHLORIDE 0.9 % IV SOLN
INTRAVENOUS | Status: DC | PRN
  Administered 2022-10-13: 80 mg

## 2022-10-13 MED ORDER — SENNOSIDES-DOCUSATE SODIUM 8.6-50 MG PO TABS
2.0000 | ORAL_TABLET | Freq: Every day | ORAL | Status: DC
  Administered 2022-10-13: 2 via ORAL
  Filled 2022-10-13: qty 2

## 2022-10-13 MED ORDER — NICOTINE 14 MG/24HR TD PT24
14.0000 mg | MEDICATED_PATCH | Freq: Every day | TRANSDERMAL | Status: DC
  Administered 2022-10-13 – 2022-10-14 (×2): 14 mg via TRANSDERMAL
  Filled 2022-10-13 (×2): qty 1

## 2022-10-13 MED ORDER — PHENYLEPHRINE 80 MCG/ML (10ML) SYRINGE FOR IV PUSH (FOR BLOOD PRESSURE SUPPORT)
PREFILLED_SYRINGE | INTRAVENOUS | Status: AC
  Filled 2022-10-13: qty 10

## 2022-10-13 MED ORDER — CHLORHEXIDINE GLUCONATE 0.12 % MT SOLN
15.0000 mL | Freq: Once | OROMUCOSAL | Status: AC
  Administered 2022-10-13: 15 mL via OROMUCOSAL

## 2022-10-13 MED ORDER — ONDANSETRON HCL 4 MG/2ML IJ SOLN: 4.0000 mg | Freq: Once | INTRAMUSCULAR | Status: DC | PRN

## 2022-10-13 MED ORDER — CHLORHEXIDINE GLUCONATE CLOTH 2 % EX PADS
6.0000 | MEDICATED_PAD | Freq: Every day | CUTANEOUS | Status: DC
  Administered 2022-10-13: 6 via TOPICAL

## 2022-10-13 MED ORDER — FLUTICASONE FUROATE-VILANTEROL 100-25 MCG/ACT IN AEPB
1.0000 | INHALATION_SPRAY | Freq: Every day | RESPIRATORY_TRACT | Status: DC
  Administered 2022-10-14: 1 via RESPIRATORY_TRACT
  Filled 2022-10-13: qty 28

## 2022-10-13 MED ORDER — ZOLPIDEM TARTRATE 5 MG PO TABS: 5.0000 mg | ORAL_TABLET | Freq: Every evening | ORAL | Status: DC | PRN

## 2022-10-13 MED ORDER — HYDROMORPHONE HCL 1 MG/ML IJ SOLN
0.5000 mg | INTRAMUSCULAR | Status: DC | PRN
  Administered 2022-10-13 – 2022-10-14 (×7): 1 mg via INTRAVENOUS
  Filled 2022-10-13 (×7): qty 1

## 2022-10-13 MED ORDER — SODIUM CHLORIDE 0.9 % IV SOLN
80.0000 mg | Freq: Once | INTRAVENOUS | Status: DC
  Filled 2022-10-13: qty 2

## 2022-10-13 MED ORDER — DULOXETINE HCL 60 MG PO CPEP
60.0000 mg | ORAL_CAPSULE | Freq: Every day | ORAL | Status: DC
  Administered 2022-10-13 – 2022-10-14 (×2): 60 mg via ORAL
  Filled 2022-10-13 (×2): qty 1

## 2022-10-13 MED ORDER — DIPHENHYDRAMINE HCL 50 MG/ML IJ SOLN: 12.5000 mg | Freq: Four times a day (QID) | INTRAMUSCULAR | Status: DC | PRN

## 2022-10-13 MED ORDER — DEXAMETHASONE SODIUM PHOSPHATE 4 MG/ML IJ SOLN
INTRAMUSCULAR | Status: DC | PRN
  Administered 2022-10-13: 5 mg via INTRAVENOUS

## 2022-10-13 MED ORDER — FENTANYL CITRATE (PF) 250 MCG/5ML IJ SOLN
INTRAMUSCULAR | Status: DC | PRN
  Administered 2022-10-13 (×5): 50 ug via INTRAVENOUS

## 2022-10-13 MED ORDER — PROPOFOL 10 MG/ML IV BOLUS
INTRAVENOUS | Status: AC
  Filled 2022-10-13: qty 20

## 2022-10-13 MED ORDER — 0.9 % SODIUM CHLORIDE (POUR BTL) OPTIME
TOPICAL | Status: DC | PRN
  Administered 2022-10-13: 1000 mL

## 2022-10-13 MED ORDER — PROPOFOL 10 MG/ML IV BOLUS
INTRAVENOUS | Status: DC | PRN
  Administered 2022-10-13: 130 mg via INTRAVENOUS

## 2022-10-13 MED ORDER — OXYBUTYNIN CHLORIDE 5 MG PO TABS: 5.0000 mg | ORAL_TABLET | Freq: Three times a day (TID) | ORAL | Status: DC | PRN

## 2022-10-13 MED ORDER — STERILE WATER FOR IRRIGATION IR SOLN
Status: DC | PRN
  Administered 2022-10-13: 1000 mL

## 2022-10-13 MED ORDER — DIPHENHYDRAMINE HCL 12.5 MG/5ML PO ELIX: 12.5000 mg | ORAL_SOLUTION | Freq: Four times a day (QID) | ORAL | Status: DC | PRN

## 2022-10-13 MED ORDER — DEXMEDETOMIDINE HCL IN NACL 80 MCG/20ML IV SOLN
INTRAVENOUS | Status: DC | PRN
  Administered 2022-10-13 (×4): 8 ug via BUCCAL

## 2022-10-13 MED ORDER — ROCURONIUM BROMIDE 10 MG/ML (PF) SYRINGE
PREFILLED_SYRINGE | INTRAVENOUS | Status: AC
  Filled 2022-10-13: qty 10

## 2022-10-13 MED ORDER — ONDANSETRON HCL 4 MG/2ML IJ SOLN
INTRAMUSCULAR | Status: AC
  Filled 2022-10-13: qty 2

## 2022-10-13 MED ORDER — MIDAZOLAM HCL 2 MG/2ML IJ SOLN
INTRAMUSCULAR | Status: DC | PRN
  Administered 2022-10-13: 2 mg via INTRAVENOUS

## 2022-10-13 MED ORDER — MIDAZOLAM HCL 2 MG/2ML IJ SOLN
INTRAMUSCULAR | Status: AC
  Filled 2022-10-13: qty 2

## 2022-10-13 MED ORDER — DEXMEDETOMIDINE HCL IN NACL 80 MCG/20ML IV SOLN
INTRAVENOUS | Status: AC
  Filled 2022-10-13: qty 20

## 2022-10-13 MED ORDER — OXYCODONE HCL 5 MG PO TABS: 5.0000 mg | ORAL_TABLET | ORAL | Status: DC | PRN

## 2022-10-13 MED ORDER — GABAPENTIN 300 MG PO CAPS
300.0000 mg | ORAL_CAPSULE | Freq: Three times a day (TID) | ORAL | Status: DC
  Administered 2022-10-13 – 2022-10-14 (×3): 300 mg via ORAL
  Filled 2022-10-13 (×3): qty 1

## 2022-10-13 MED ORDER — PHENYLEPHRINE HCL-NACL 20-0.9 MG/250ML-% IV SOLN
INTRAVENOUS | Status: DC | PRN
  Administered 2022-10-13: 50 ug/min via INTRAVENOUS

## 2022-10-13 MED ORDER — OXYCODONE HCL 5 MG/5ML PO SOLN: 5.0000 mg | Freq: Once | ORAL | Status: DC | PRN

## 2022-10-13 MED ORDER — LACTATED RINGERS IV SOLN: INTRAVENOUS | Status: DC

## 2022-10-13 MED ORDER — VANCOMYCIN HCL IN DEXTROSE 1-5 GM/200ML-% IV SOLN
1000.0000 mg | Freq: Two times a day (BID) | INTRAVENOUS | Status: AC
  Administered 2022-10-13: 1000 mg via INTRAVENOUS
  Filled 2022-10-13: qty 200

## 2022-10-13 MED ORDER — ORAL CARE MOUTH RINSE: 15.0000 mL | Freq: Once | OROMUCOSAL | Status: AC

## 2022-10-13 MED ORDER — SUGAMMADEX SODIUM 200 MG/2ML IV SOLN
INTRAVENOUS | Status: DC | PRN
  Administered 2022-10-13: 200 mg via INTRAVENOUS

## 2022-10-13 MED ORDER — METOPROLOL TARTRATE 50 MG PO TABS
50.0000 mg | ORAL_TABLET | Freq: Every day | ORAL | Status: DC
  Administered 2022-10-13 – 2022-10-14 (×2): 50 mg via ORAL
  Filled 2022-10-13 (×2): qty 1

## 2022-10-13 MED ORDER — PHENYLEPHRINE HCL-NACL 20-0.9 MG/250ML-% IV SOLN
INTRAVENOUS | Status: AC
  Filled 2022-10-13: qty 250

## 2022-10-13 MED ORDER — PHENYLEPHRINE 80 MCG/ML (10ML) SYRINGE FOR IV PUSH (FOR BLOOD PRESSURE SUPPORT)
PREFILLED_SYRINGE | INTRAVENOUS | Status: DC | PRN
  Administered 2022-10-13 (×5): 160 ug via INTRAVENOUS

## 2022-10-13 MED ORDER — LIDOCAINE 2% (20 MG/ML) 5 ML SYRINGE
INTRAMUSCULAR | Status: DC | PRN
  Administered 2022-10-13: 100 mg via INTRAVENOUS

## 2022-10-13 MED ORDER — DEXAMETHASONE SODIUM PHOSPHATE 10 MG/ML IJ SOLN
INTRAMUSCULAR | Status: AC
  Filled 2022-10-13: qty 1

## 2022-10-13 MED ORDER — ACETAMINOPHEN 325 MG PO TABS: 650.0000 mg | ORAL_TABLET | ORAL | Status: DC | PRN

## 2022-10-13 MED ORDER — AMLODIPINE BESYLATE 5 MG PO TABS
5.0000 mg | ORAL_TABLET | Freq: Every day | ORAL | Status: DC
  Administered 2022-10-13 – 2022-10-14 (×2): 5 mg via ORAL
  Filled 2022-10-13 (×2): qty 1

## 2022-10-13 MED ORDER — OXYCODONE HCL 5 MG PO TABS: 5.0000 mg | ORAL_TABLET | Freq: Once | ORAL | Status: DC | PRN

## 2022-10-13 MED ORDER — LIDOCAINE HCL (PF) 2 % IJ SOLN
INTRAMUSCULAR | Status: AC
  Filled 2022-10-13: qty 5

## 2022-10-13 MED ORDER — FENTANYL CITRATE PF 50 MCG/ML IJ SOSY
25.0000 ug | PREFILLED_SYRINGE | INTRAMUSCULAR | Status: DC | PRN
  Administered 2022-10-13: 50 ug via INTRAVENOUS
  Filled 2022-10-13: qty 1

## 2022-10-13 MED ORDER — ONDANSETRON HCL 4 MG/2ML IJ SOLN: 4.0000 mg | INTRAMUSCULAR | Status: DC | PRN

## 2022-10-13 SURGICAL SUPPLY — 61 items
ADH SKN CLS APL DERMABOND .7 (GAUZE/BANDAGES/DRESSINGS) ×1
APL PRP STRL LF DISP 70% ISPRP (MISCELLANEOUS) ×1
BAG DECANTER FOR FLEXI CONT (MISCELLANEOUS) IMPLANT
BAG DRN RND TRDRP ANRFLXCHMBR (UROLOGICAL SUPPLIES)
BAG HAMPER (MISCELLANEOUS) ×1 IMPLANT
BAG URINE DRAIN 2000ML AR STRL (UROLOGICAL SUPPLIES) IMPLANT
BNDG GAUZE DERMACEA FLUFF 4 (GAUZE/BANDAGES/DRESSINGS) IMPLANT
BNDG GZE DERMACEA 4 6PLY (GAUZE/BANDAGES/DRESSINGS) ×1
BRUSH SCRUB SURG 4.25 DISP (MISCELLANEOUS) IMPLANT
CATH FOLEY 2WAY SLVR  5CC 16FR (CATHETERS) ×1
CATH FOLEY 2WAY SLVR 5CC 16FR (CATHETERS) IMPLANT
CHLORAPREP W/TINT 26 (MISCELLANEOUS) IMPLANT
COVER LIGHT HANDLE STERIS (MISCELLANEOUS) ×2 IMPLANT
COVER MAYO STAND XLG (MISCELLANEOUS) IMPLANT
DERMABOND ADVANCED .7 DNX12 (GAUZE/BANDAGES/DRESSINGS) ×1 IMPLANT
DRAIN PENROSE 0.5X18 (DRAIN) ×1 IMPLANT
ELECT NDL BLADE 2-5/6 (NEEDLE) IMPLANT
ELECT NEEDLE BLADE 2-5/6 (NEEDLE) ×1 IMPLANT
ELECT REM PT RETURN 9FT ADLT (ELECTROSURGICAL) ×1
ELECTRODE REM PT RTRN 9FT ADLT (ELECTROSURGICAL) ×1 IMPLANT
GAUZE SPONGE 4X4 12PLY STRL LF (GAUZE/BANDAGES/DRESSINGS) ×2 IMPLANT
GLOVE BIO SURGEON STRL SZ8 (GLOVE) ×1 IMPLANT
GLOVE BIOGEL PI IND STRL 7.0 (GLOVE) ×2 IMPLANT
GLOVE BIOGEL PI IND STRL 8 (GLOVE) ×1 IMPLANT
GLOVE ECLIPSE 6.5 STRL STRAW (GLOVE) IMPLANT
GOWN STRL REUS W/TWL LRG LVL3 (GOWN DISPOSABLE) ×2 IMPLANT
GOWN STRL REUS W/TWL XL LVL3 (GOWN DISPOSABLE) ×1 IMPLANT
KIT ACCESSORY AMS 700 PUMP (Urological Implant) IMPLANT
KIT TURNOVER KIT A (KITS) ×1 IMPLANT
MANIFOLD NEPTUNE II (INSTRUMENTS) ×1 IMPLANT
NS IRRIG 1000ML POUR BTL (IV SOLUTION) IMPLANT
NS IRRIG 500ML POUR BTL (IV SOLUTION) IMPLANT
PACK ABDOMINAL MAJOR (CUSTOM PROCEDURE TRAY) ×1 IMPLANT
PAD ABD 5X9 TENDERSORB (GAUZE/BANDAGES/DRESSINGS) ×1 IMPLANT
PAD ARMBOARD 7.5X6 YLW CONV (MISCELLANEOUS) ×1 IMPLANT
PLUG CATH AND CAP STER (CATHETERS) ×1 IMPLANT
PUMP PRECONNECT MS 21 LGX (Erectile Restoration) IMPLANT
PUMP PRECONNECT MS 21CM LGX (Erectile Restoration) ×1 IMPLANT
RESERVOIR FLAT IZ 100ML (Miscellaneous) IMPLANT
RETRACTOR DEEP SCROTAL PENILE (MISCELLANEOUS) IMPLANT
SET BASIN LINEN APH (SET/KITS/TRAYS/PACK) ×1 IMPLANT
SOL PREP PROV IODINE SCRUB 4OZ (MISCELLANEOUS) ×1 IMPLANT
SPONGE INTESTINAL PEANUT (DISPOSABLE) IMPLANT
SUT ETHILON 3 0 FSL (SUTURE) ×1 IMPLANT
SUT MNCRL AB 4-0 PS2 18 (SUTURE) ×1 IMPLANT
SUT VIC AB 0 CT1 27 (SUTURE) ×1
SUT VIC AB 0 CT1 27XBRD ANTBC (SUTURE) ×1 IMPLANT
SUT VIC AB 2-0 CT1 27 (SUTURE) ×1
SUT VIC AB 2-0 CT1 TAPERPNT 27 (SUTURE) ×1 IMPLANT
SUT VIC AB 2-0 SH 27 (SUTURE) ×2
SUT VIC AB 2-0 SH 27XBRD (SUTURE) IMPLANT
SUT VIC AB 2-0 UR6 27 (SUTURE) IMPLANT
SUT VIC AB 3-0 SH 27 (SUTURE)
SUT VIC AB 3-0 SH 27X BRD (SUTURE) IMPLANT
SYR 10ML LL (SYRINGE) IMPLANT
SYR 50ML LL SCALE MARK (SYRINGE) IMPLANT
SYR CONTROL 10ML LL (SYRINGE) IMPLANT
TOOL FURLOW INSERT DISP (UROLOGICAL SUPPLIES) IMPLANT
WATER STERILE IRR 1000ML POUR (IV SOLUTION) IMPLANT
WATER STERILE IRR 500ML POUR (IV SOLUTION) IMPLANT
YANKAUER SUCT BULB TIP 10FT TU (MISCELLANEOUS) IMPLANT

## 2022-10-13 NOTE — Op Note (Signed)
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 Replacement of an AMS 700 3 Piece inflatable penile prosthesis  Attending: Rosie Fate, MD  Anesthesia: General  History of blood loss: Minimal  Antibiotics: Vancomycin and Gentamicin  Drains: 16 french foley  Specimens: none  Findings: 100cc reservoir replaced on left. 21cm corporal length on right and left. Barrel crimp connection disconnected on original IPP. No deformity or curvature on cycling of the device  Indications: Patient is a 62 year old male with a history of a nonfunctioning IPP.  We discussed the treatment options and he has elected to pursue penile prosthesis revision   Procedure in detail: Prior to procedure consent was obtained.  Patient was brought to the operating room and a brief timeout was done to ensure correct patient, correct procedure, correct site.  General anesthesia was administered and patient was placed in supine position. We performed a 10 minute scrub of his genitalia prior to the using alcohol prep.   His genitalia and abdomen was then prepped and draped in usual sterile fashion.  A 16 French foley catheter was placed and the bladder was drained. The penile was then placed on stretch with the aid of a hook through the meatus attached to the lonestar retractor. A 4 cm incision was made at the penoscrotal junction.  We dissected down to the urethra and corporal bodies.  We then dissected around the original tubing and pump in the scrotum. We then brought the pump into the operative field. We noted the resevoir connection was severed. We then dissected around the reservoir and removed it intact. We then dissected the tubing down to the insertion into the corporal bodies. We then placed 2 stay sutures on the medial and lateral aspect of the corporal bodies with 2-0 vicryl. We then used electrocautery to make a 2cm longitudinal incision between  the stay sutures on the right corporal body. We then used sequential dilators to dilate the proximal and distal corporal body to 18m. We the measured the proximal corporal length which was 13cm. We then measured the distal corporal length which was 8cm.  The corporal body was the irrigated with normal saline. We then did a similar technique on the left. The proximal corporal length was 13cm and the distal corporal length was 8cm. We then turned our attention to placing the reservoir.  We then placed the reservoir and filled it with 100cc of normal saline and then a rubber shod was placed on the tubing. We then turned our attention to placing the cylinders. We placed the proximal end of the right cylinder into the corpora and seated it with the aid of the applicator. We the placed the string attached to the distal end of the cylinder through a [place]. We then attached the needle to the furlough and advanced the furlough through the corporal incision up to the glans. The needle was then advanced through the glans and the string was then secured with a snap. Once cylinder was seated int he corpora we then tied the stay sutures over the cylinder. We then turned our attention to the left corpora. A similar technique was used to place the left cylinder. Once this was complete we then tested the device and noted no leaks and a straight erection.We then deflated the device. We then proceeded to make a subdartos pocket in the scrotum for the pump. Once this was complete the pump was placed the the pouch and the pouch was then closed  with a running 2-0 Vicryl. We then turned our attention to connecting the device. The tubing was cut to length and then the locking clips were placed on either end of the tubing. A barrel connector was then placed on on end of the tubing. The tubing was then irrigated with normal saline to ensure no air bubbles in the tubing. The free end of the barrel was then attached to the reservoir  tubing and using the crimping tool the barrel was secured to the tubing. We then closed the dartos over the tubing suing a 2-0 vicryl in a running fashion. We then closed a second layer of dartos over the tubing. The skin was then closed with 4-0 monocryl in a running fashion. Skin glue was then placed over the incision. We then removed the strings attached to the distal ends of the cylinders and placed skin glue over the incisions in the glans. The device was then cycled to partially erect.  We then placed a scrotal fluff and this then concluded the procedure which was well tolerated by the patient.  Complications: None  Condition: Stable, extubated, transferred to PACU.  Plan: Patient is to be admitted overnight for IV antibiotics.  His foley will be removed in the morning and the device will be deactivated. He will followup in 2 weeks for a wound check. He will be discharged with 1 week of antibiotics.

## 2022-10-13 NOTE — Transfer of Care (Signed)
Immediate Anesthesia Transfer of Care Note  Patient: Bryan Fleming  Procedure(s) Performed: REMOVAL OF PENILE PROSTHESIS (Penis)  Patient Location: PACU  Anesthesia Type:General  Level of Consciousness: awake  Airway & Oxygen Therapy: Patient Spontanous Breathing and Patient connected to face mask oxygen  Post-op Assessment: Report given to RN and Post -op Vital signs reviewed and stable  Post vital signs: Reviewed and stable  Last Vitals:  Vitals Value Taken Time  BP    Temp    Pulse 76 10/13/22 1010  Resp 18 10/13/22 1010  SpO2 100 % 10/13/22 1010  Vitals shown include unvalidated device data.  Last Pain:  Vitals:   10/13/22 0647  TempSrc: Oral  PainSc: 0-No pain         Complications: No notable events documented.

## 2022-10-13 NOTE — Anesthesia Procedure Notes (Signed)
Procedure Name: Intubation Date/Time: 10/13/2022 7:45 AM  Performed by: Orlie Dakin, CRNAPre-anesthesia Checklist: Patient identified, Emergency Drugs available, Suction available and Patient being monitored Patient Re-evaluated:Patient Re-evaluated prior to induction Oxygen Delivery Method: Circle system utilized Preoxygenation: Pre-oxygenation with 100% oxygen Induction Type: IV induction Ventilation: Mask ventilation without difficulty Laryngoscope Size: Mac and 4 Grade View: Grade II Tube type: Oral Tube size: 7.5 mm Number of attempts: 1 Airway Equipment and Method: Stylet Placement Confirmation: ETT inserted through vocal cords under direct vision, positive ETCO2 and breath sounds checked- equal and bilateral Secured at: 22 cm Tube secured with: Tape Dental Injury: Teeth and Oropharynx as per pre-operative assessment  Comments: 4x4s bite block used.

## 2022-10-13 NOTE — H&P (Signed)
HPI: [name] is a 62yo here for evaluation of a nonfunctioning penile prosthesis. The device stopped cycling in December. The pump bulb deflates when pumping and does not refill. No testicular or penile pain     PMH:     Past Medical History:  Diagnosis Date   Cancer ([place])      Lung cancer   COPD (chronic obstructive pulmonary disease) ([place]) 2007    EMPHYSEMA   Depression     Dyspnea      occasional - no oxygen   GERD (gastroesophageal reflux disease) 2010   Hepatitis      Alcoholic    HLD (hyperlipidemia)      no meds   Hypertension     Port-A-Cath in place 01/01/2021      Surgical History:      Past Surgical History:  Procedure Laterality Date   BRONCHIAL BIOPSY   12/23/2020    Procedure: BRONCHIAL BIOPSIES;  Surgeon: [name], MD;  Location: [place];  Service: Pulmonary;;   BRONCHIAL BRUSHINGS   12/23/2020    Procedure: BRONCHIAL BRUSHINGS;  Surgeon: [name], MD;  Location: [place];  Service: Pulmonary;;   BRONCHIAL NEEDLE ASPIRATION BIOPSY   12/23/2020    Procedure: BRONCHIAL NEEDLE ASPIRATION BIOPSIES;  Surgeon: [name], MD;  Location: [place];  Service: Pulmonary;;   BRONCHIAL WASHINGS   12/23/2020    Procedure: BRONCHIAL WASHINGS;  Surgeon: [name], MD;  Location: [facility] ENDOSCOPY;  Service: Pulmonary;;   CARDIAC CATHETERIZATION   2010   COLONOSCOPY   03/14/2012    Procedure: COLONOSCOPY;  Surgeon: Rogene Houston, MD;  [facility];  Service: Endoscopy;  Laterality: N/A;  100   INTERCOSTAL NERVE BLOCK   04/21/2021    Procedure: INTERCOSTAL NERVE BLOCK;  Surgeon: Melrose Nakayama, MD;  Location: [place];  Service: Thoracic;;   IR IMAGING GUIDED PORT INSERTION   01/12/2021   LUNG REMOVAL, PARTIAL   08/2021   PENILE PROSTHESIS IMPLANT N/A 02/17/2022    Procedure: PENILE PROTHESIS INFLATABLE;  Surgeon: Cleon Gustin, MD;  Location: [facility];  Service: Urology;  Laterality: N/A;   PORTACATH PLACEMENT    07/2021   VIDEO ASSISTED THORACOSCOPY (VATS)/ LOBECTOMY Left 04/21/2021    Procedure: VIDEO ASSISTED THORACOSCOPY (VATS)/ LOBECTOMY;  Surgeon: Melrose Nakayama, MD;  Location: [place];  Service: Thoracic;  Laterality: Left;   VIDEO BRONCHOSCOPY WITH ENDOBRONCHIAL NAVIGATION N/A 12/23/2020    Procedure: VIDEO BRONCHOSCOPY WITH ENDOBRONCHIAL NAVIGATION;  Surgeon: [name], MD;  Location: [place] ENDOSCOPY;  Service: Pulmonary;  Laterality: N/A;   WRIST GANGLION EXCISION   1985      Home Medications:  Allergies as of 08/29/2022         Reactions    Other Nausea Only, Other (See Comments)    No spicy foods- Patient cannot tolerate- HEARTBURN            Medication List           Accurate as of August 29, 2022 10:00 AM. If you have any questions, ask your nurse or doctor.              acetaminophen 500 MG tablet Commonly known as: TYLENOL Take 2 tablets (1,000 mg total) by mouth every 6 (six) hours as needed. What changed: how much to take    amLODipine 5 MG tablet Commonly known as: NORVASC Take 5 mg by mouth daily.    bismuth subsalicylate [id number]  suspension Commonly known as: PEPTO BISMOL Take 30 mLs by mouth every 6 (six) hours as needed for diarrhea or loose stools or indigestion.    gabapentin 300 MG capsule Commonly known as: NEURONTIN Take 1 capsule (300 mg total) by mouth 3 (three) times daily.    metoprolol tartrate 25 MG tablet Commonly known as: LOPRESSOR Take 25 mg by mouth daily as needed.    multivitamin with minerals Tabs tablet Take 1 tablet by mouth daily. What changed: when to take this    naloxone 4 MG/0.1ML Liqd nasal spray kit Commonly known as: NARCAN Spray 0.1 mL (contents of one device) into one nostril upon signs of opioid overdose.  Call 911.  May repeat once if no response within 2-3 minutes.    Oxycodone HCl 10 MG Tabs Take 1 tablet (10 mg total) by mouth every 4 (four) hours as needed.    Proventil HFA 108 (90 Base) MCG/ACT  inhaler Generic drug: albuterol INHALE 2 PUFFS BY MOUTH EVERY 6 HOURS AS NEEDED FOR COUGHING, WHEEZING, OR SHORTNESS OF BREATH    sulfamethoxazole-trimethoprim 800-160 MG tablet Commonly known as: BACTRIM DS Take 1 tablet by mouth 2 (two) times daily.    tadalafil 5 MG tablet Commonly known as: CIALIS Take 1 tablet (5 mg total) by mouth daily as needed for erectile dysfunction.    Trelegy Ellipta 100-62.5-25 MCG/ACT Aepb Generic drug: Fluticasone-Umeclidin-Vilant Inhale 1 puff into the lungs daily in the afternoon.             Allergies:       Allergies  Allergen Reactions   Other Nausea Only and Other (See Comments)      No spicy foods- Patient cannot tolerate- HEARTBURN      Family History:      Family History  Problem Relation Age of Onset   Hypertension Mother     Asthma Father     Diabetes Sister     Hypertension Sister     Hypertension Brother     Hypertension Sister     Lupus Daughter        Social History:  reports that he has been smoking cigarettes. He has a 37.00 pack-year smoking history. He has never used smokeless tobacco. He reports current alcohol use of about 56.0 standard drinks of alcohol per week. He reports that he does not use drugs.   ROS: All other review of systems were reviewed and are negative except what is noted above in HPI   Physical Exam: BP 117/69   Pulse (!) 104   Constitutional:  Alert and oriented, No acute distress. HEENT: [place] AT, moist mucus membranes.  Trachea midline, no masses. Cardiovascular: No clubbing, cyanosis, or edema. Respiratory: Normal respiratory effort, no increased work of breathing. GI: Abdomen is soft, nontender, nondistended, no abdominal masses GU: No CVA tenderness. Circumcised phallus. No masses/lesions on penis, testis, scrotum. Prostate 40g smooth no nodules no induration.  Lymph: No cervical or inguinal lymphadenopathy. Skin: No rashes, bruises or suspicious lesions. Neurologic: Grossly intact, no  focal deficits, moving all 4 extremities. Psychiatric: Normal mood and affect.   Laboratory Data: Recent Labs       Lab Results  Component Value Date    WBC 10.5 02/18/2022    HGB 14.0 02/18/2022    HCT 44.1 02/18/2022    MCV 98.9 02/18/2022    PLT 226 02/18/2022        Recent Labs       Lab Results  Component Value Date  CREATININE 0.63 02/18/2022        Recent Labs       Lab Results  Component Value Date    PSA 0.82 08/20/2015        Recent Labs  No results found for: "TESTOSTERONE"     Recent Labs       Lab Results  Component Value Date    HGBA1C 5.5 06/04/2020        Urinalysis Labs (Brief)          Component Value Date/Time    COLORURINE YELLOW 04/19/2021 1510    APPEARANCEUR HAZY (A) 04/19/2021 1510    LABSPEC 1.012 04/19/2021 1510    PHURINE 5.0 04/19/2021 1510    GLUCOSEU NEGATIVE 04/19/2021 1510    HGBUR NEGATIVE 04/19/2021 1510    BILIRUBINUR NEGATIVE 04/19/2021 1510    KETONESUR NEGATIVE 04/19/2021 1510    PROTEINUR NEGATIVE 04/19/2021 1510    UROBILINOGEN 0.2 10/28/2007 1435    NITRITE NEGATIVE 04/19/2021 1510    LEUKOCYTESUR NEGATIVE 04/19/2021 1510        Recent Labs       Lab Results  Component Value Date    BACTERIA NONE SEEN 06/04/2020        Pertinent Imaging:   No results found for this or any previous visit.   No results found for this or any previous visit.   No results found for this or any previous visit.   No results found for this or any previous visit.   No results found for this or any previous visit.   No valid procedures specified. No results found for this or any previous visit.   No results found for this or any previous visit.     Assessment & Plan:     1. S/P insertion of penile implant We discussed the management of malfunctioning penile prosthesis including observation and revision. After discussing the treatment options the patient elects to proceed with penile prosthesis revision.  Risks/benefits/alternatives discussed     No follow-ups on file.   Nicolette Bang, MD   [facility] Urology [place]

## 2022-10-13 NOTE — Anesthesia Preprocedure Evaluation (Signed)
Anesthesia Evaluation  Patient identified by MRN, date of birth, ID band Patient awake    Reviewed: Allergy & Precautions, NPO status , Patient's Chart, lab work & pertinent test results  History of Anesthesia Complications Negative for: history of anesthetic complications  Airway Mallampati: II  TM Distance: >3 FB Neck ROM: Full    Dental  (+) Dental Advisory Given   Pulmonary shortness of breath, COPD,  COPD inhaler, Current Smoker   breath sounds clear to auscultation       Cardiovascular hypertension, Pt. on medications (-) angina (-) CHF negative cardio ROS  Rhythm:Regular  1. Left ventricular ejection fraction, by estimation, is >75%. The left  ventricle has hyperdynamic function. The left ventricle has no regional  wall motion abnormalities. There is moderate left ventricular hypertrophy.  Left ventricular diastolic  parameters are indeterminate. Increased LVOT gradient with Valsalva,  approximately 2-3 m/s.  2. Right ventricular systolic function is normal. The right ventricular  size is normal. Tricuspid regurgitation signal is inadequate for assessing  PA pressure.  3. The mitral valve is grossly normal. Trivial mitral valve  regurgitation.  4. The aortic valve is tricuspid. There is mild calcification of the  aortic valve. Aortic valve regurgitation is mild.  5. The inferior vena cava is normal in size with greater than 50%  respiratory variability, suggesting right atrial pressure of 3 mmHg.    Neuro/Psych  PSYCHIATRIC DISORDERS Anxiety Depression     Neuromuscular disease (peripheral neuropathy 2ry chemo)    GI/Hepatic ,GERD  Medicated and Controlled,,(+) Hepatitis -  Endo/Other  negative endocrine ROS    Renal/GU negative Renal ROS     Musculoskeletal negative musculoskeletal ROS (+)    Abdominal   Peds  Hematology  (+) Blood dyscrasia, anemia Lab Results      Component                Value                Date                      WBC                      8.1                 04/19/2021                HGB                      13.6                04/19/2021                HCT                      40.4                04/19/2021                MCV                      95.5                04/19/2021                PLT  306                 04/19/2021              Anesthesia Other Findings   Reproductive/Obstetrics                             Anesthesia Physical Anesthesia Plan  ASA: 2  Anesthesia Plan: General and General ETT   Post-op Pain Management:    Induction: Intravenous  PONV Risk Score and Plan: Ondansetron  Airway Management Planned: LMA and Oral ETT  Additional Equipment:   Intra-op Plan:   Post-operative Plan:   Informed Consent: I have reviewed the patients History and Physical, chart, labs and discussed the procedure including the risks, benefits and alternatives for the proposed anesthesia with the patient or authorized representative who has indicated his/her understanding and acceptance.     Dental advisory given  Plan Discussed with: CRNA  Anesthesia Plan Comments:         Anesthesia Quick Evaluation

## 2022-10-14 DIAGNOSIS — J449 Chronic obstructive pulmonary disease, unspecified: Secondary | ICD-10-CM | POA: Diagnosis not present

## 2022-10-14 DIAGNOSIS — I10 Essential (primary) hypertension: Secondary | ICD-10-CM | POA: Diagnosis not present

## 2022-10-14 DIAGNOSIS — Y828 Other medical devices associated with adverse incidents: Secondary | ICD-10-CM | POA: Diagnosis not present

## 2022-10-14 DIAGNOSIS — R69 Illness, unspecified: Secondary | ICD-10-CM | POA: Diagnosis not present

## 2022-10-14 DIAGNOSIS — Z85118 Personal history of other malignant neoplasm of bronchus and lung: Secondary | ICD-10-CM | POA: Diagnosis not present

## 2022-10-14 DIAGNOSIS — T83410A Breakdown (mechanical) of penile (implanted) prosthesis, initial encounter: Secondary | ICD-10-CM | POA: Diagnosis not present

## 2022-10-14 LAB — BASIC METABOLIC PANEL
Anion gap: 10 (ref 5–15)
BUN: 8 mg/dL (ref 6–20)
CO2: 26 mmol/L (ref 22–32)
Calcium: 8.6 mg/dL — ABNORMAL LOW (ref 8.9–10.3)
Chloride: 98 mmol/L (ref 98–111)
Creatinine, Ser: 0.62 mg/dL (ref 0.61–1.24)
GFR, Estimated: >60 mL/min (ref >60)
Glucose, Bld: 135 mg/dL — ABNORMAL HIGH (ref 70–99)
Potassium: 3.6 mmol/L (ref 3.5–5.1)
Sodium: 134 mmol/L — ABNORMAL LOW (ref 135–145)

## 2022-10-14 LAB — CBC
HCT: 43.6 % (ref 39.0–52.0)
Hemoglobin: 14.5 g/dL (ref 13.0–17.0)
MCH: 32.2 pg (ref 26.0–34.0)
MCHC: 33.3 g/dL (ref 30.0–36.0)
MCV: 96.7 fL (ref 80.0–100.0)
Platelets: 217 10*3/uL (ref 150–400)
RBC: 4.51 MIL/uL (ref 4.22–5.81)
RDW: 13.4 % (ref 11.5–15.5)
WBC: 11.1 10*3/uL — ABNORMAL HIGH (ref 4.0–10.5)
nRBC: 0 % (ref 0.0–0.2)

## 2022-10-14 LAB — SURGICAL PATHOLOGY, GROSS ONLY (NOT ARMC)

## 2022-10-14 MED ORDER — SULFAMETHOXAZOLE-TRIMETHOPRIM 800-160 MG PO TABS: 1.0000 | ORAL_TABLET | Freq: Two times a day (BID) | ORAL | 0 refills | Status: DC

## 2022-10-14 MED ORDER — OXYCODONE HCL 5 MG PO TABS: 5.0000 mg | ORAL_TABLET | ORAL | 0 refills | Status: DC | PRN

## 2022-10-14 NOTE — Progress Notes (Signed)
Patient foley was removed. Patient told to let staff know once he has voided.

## 2022-10-14 NOTE — Progress Notes (Signed)
Patient requested prn pain medication, provided per order.

## 2022-10-14 NOTE — Progress Notes (Signed)
Assisted patient with sitting on the edge of the bed and walking out in the hall way. Patient tolerated well.

## 2022-10-14 NOTE — TOC Progression Note (Signed)
Transition of Care [facility]) - Progression Note    Patient Details  Name: Bryan Fleming MRN: [id number] Date of Birth: [birth year]  Transition of Care [facility]) CM/SW Contact  Salome Arnt, [place] Phone Number: 10/14/2022, 8:43 AM  Clinical Narrative:   Transition of Care [facility]) Screening Note   Patient Details  Name: Bryan Fleming Date of Birth: 05/13/1961   Transition of Care [facility]) CM/SW Contact:    Salome Arnt, [place] Phone Number: 10/14/2022, 8:44 AM    Transition of Care Department [name]) has reviewed patient and no TOC needs have been identified at this time. We will continue to monitor patient advancement through interdisciplinary progression rounds. If new patient transition needs arise, please place a TOC consult.            Expected Discharge Plan and Services         Expected Discharge Date: 10/14/22                                     Social Determinants of Health (SDOH) Interventions [place]: Low Risk  (12/03/2020)  Transportation Needs: No Transportation Needs (12/03/2020)  Depression (PHQ2-9): Low Risk  (12/03/2020)  Physical Activity: Inactive (12/03/2020)  Tobacco Use: High Risk (10/13/2022)    Readmission Risk Interventions     No data to display

## 2022-10-14 NOTE — Care Management Obs Status (Signed)
[place] NOTIFICATION   Patient Details  Name: Bryan Fleming MRN: [id number] Date of Birth: 28-Sep-1960   Medicare Observation Status Notification Given:  Yes    Tommy Medal 10/14/2022, 11:43 AM

## 2022-10-14 NOTE — Anesthesia Postprocedure Evaluation (Signed)
Anesthesia Post Note  Patient: Bryan Fleming  Procedure(s) Performed: REMOVAL OF PENILE PROSTHESIS (Penis)  Patient location during evaluation: Phase II Anesthesia Type: General Level of consciousness: awake Pain management: pain level controlled Vital Signs Assessment: post-procedure vital signs reviewed and stable Respiratory status: spontaneous breathing and respiratory function stable Cardiovascular status: blood pressure returned to baseline and stable Postop Assessment: no headache and no apparent nausea or vomiting Anesthetic complications: no Comments: Late entry   No notable events documented.   Last Vitals:  Vitals:   10/14/22 0429 10/14/22 0729  BP: (!) 148/65   Pulse: 84   Resp: 18   Temp: 37.2 C   SpO2: 93% 94%    Last Pain:  Vitals:   10/14/22 0435  TempSrc:   PainSc: [place]

## 2022-10-14 NOTE — Progress Notes (Signed)
Patient is resting in bed at this time. 3 prn medications given during this shift. Patient eating a regular diet and tolerating well, no nausea or vomiting. Patient urine output is 900 for this shift.

## 2022-10-17 ENCOUNTER — Telehealth: Payer: Self-pay

## 2022-10-17 NOTE — Telephone Encounter (Signed)
Patient called in today concern about pain medication. Patient states what is he supposed to do if he runs out of medication taking it every 4 hours. Patient is aware that I will send a message to MD. Patient voiced understanding

## 2022-10-18 NOTE — Discharge Summary (Signed)
Physician Discharge Summary  Patient ID: Bryan Fleming MRN: [id number] DOB/AGE: [birth year] 62 y.o.  Admit date: 10/13/2022 Discharge date: 10/14/2022  Admission Diagnoses:  Erectile dysfunction  Discharge Diagnoses:  Principal Problem:   Erectile dysfunction Active Problems:   Malfunction of penile prosthesis [facility])   Past Medical History:  Diagnosis Date   Cancer ([place])    Lung cancer   COPD (chronic obstructive pulmonary disease) ([place]) 2007   EMPHYSEMA   Depression    Dyspnea    occasional - no oxygen   GERD (gastroesophageal reflux disease) 2010   Hepatitis    Alcoholic    HLD (hyperlipidemia)    no meds   Hypertension    Port-A-Cath in place 01/01/2021    Surgeries: Procedure(s): REMOVAL OF PENILE PROSTHESIS on 10/13/2022   Consultants (if any):   Discharged Condition: Improved  Hospital Course: Bryan Fleming is an 62 y.o. male who was admitted 10/13/2022 with a diagnosis of Erectile dysfunction and went to the operating room on 10/13/2022 and underwent the above named procedures.    He was given perioperative antibiotics:  Anti-infectives (From admission, onward)    Start     Dose/Rate Route Frequency Ordered Stop   10/14/22 0000  sulfamethoxazole-trimethoprim (BACTRIM DS) 800-160 MG tablet        1 tablet Oral 2 times daily 10/14/22 0834     10/13/22 1245  vancomycin (VANCOCIN) IVPB 1000 mg/200 mL premix        1,000 mg 200 mL/hr over 60 Minutes Intravenous Every 12 hours 10/13/22 1155 10/13/22 1436   10/13/22 0900  gentamicin (GARAMYCIN) 80 mg in sodium chloride 0.9 % 500 mL irrigation  Status:  Discontinued        80 mg Irrigation Once 10/13/22 0803 10/14/22 1701   10/13/22 0847  gentamicin (GARAMYCIN) 80 mg in sodium chloride 0.9 % 500 mL irrigation  Status:  Discontinued          As needed 10/13/22 0848 10/13/22 1121   10/13/22 0500  gentamicin (GARAMYCIN) 360 mg in dextrose 5 % 100 mL IVPB        5 mg/kg  71.6 kg 109 mL/hr over 60 Minutes  Intravenous 30 min pre-op 10/12/22 1151 10/14/22 0956   10/13/22 0500  vancomycin (VANCOREADY) IVPB 1500 mg/300 mL        1,500 mg 150 mL/hr over 120 Minutes Intravenous 120 min pre-op 10/12/22 1151 10/13/22 0935     .  He was given sequential compression devices, early ambulation for DVT prophylaxis.  He benefited maximally from the hospital stay and there were no complications.    Recent vital signs:  Vitals:   10/14/22 0429 10/14/22 0729  BP: (!) 148/65   Pulse: 84   Resp: 18   Temp: 99 F (37.2 C)   SpO2: 93% 94%    Recent laboratory studies:  Lab Results  Component Value Date   HGB 14.5 10/14/2022   HGB 15.4 08/31/2022   HGB 14.0 02/18/2022   Lab Results  Component Value Date   WBC 11.1 (H) 10/14/2022   PLT 217 10/14/2022   Lab Results  Component Value Date   INR 1.0 04/19/2021   Lab Results  Component Value Date   NA 134 (L) 10/14/2022   K 3.6 10/14/2022   CL 98 10/14/2022   CO2 26 10/14/2022   BUN 8 10/14/2022   CREATININE 0.62 10/14/2022   GLUCOSE 135 (H) 10/14/2022    Discharge Medications:   Allergies as of 10/14/2022  Reactions   Other Nausea Only, Other (See Comments)   No spicy foods- Patient cannot tolerate- HEARTBURN        Medication List     TAKE these medications    acetaminophen 500 MG tablet Commonly known as: TYLENOL Take 2 tablets (1,000 mg total) by mouth every 6 (six) hours as needed. What changed: how much to take   amLODipine 5 MG tablet Commonly known as: NORVASC Take 5 mg by mouth daily.   DULoxetine 30 MG capsule Commonly known as: CYMBALTA Take 2 capsules (60 mg total) by mouth daily.   gabapentin 300 MG capsule Commonly known as: NEURONTIN Take 1 capsule (300 mg total) by mouth 3 (three) times daily.   lidocaine 2 % solution Commonly known as: XYLOCAINE Use as directed 15 mLs in the mouth or throat as needed for mouth pain.   metoprolol tartrate 50 MG tablet Commonly known as: LOPRESSOR Take 50  mg by mouth daily.   multivitamin with minerals Tabs tablet Take 1 tablet by mouth daily.   oxyCODONE 5 MG immediate release tablet Commonly known as: Oxy IR/ROXICODONE Take 1 tablet (5 mg total) by mouth every 4 (four) hours as needed for moderate pain.   Proventil HFA 108 (90 Base) MCG/ACT inhaler Generic drug: albuterol INHALE 2 PUFFS BY MOUTH EVERY 6 HOURS AS NEEDED FOR COUGHING, WHEEZING, OR SHORTNESS OF BREATH   sulfamethoxazole-trimethoprim 800-160 MG tablet Commonly known as: BACTRIM DS Take 1 tablet by mouth 2 (two) times daily.   Trelegy Ellipta 100-62.5-25 MCG/ACT Aepb Generic drug: Fluticasone-Umeclidin-Vilant Inhale 1 puff into the lungs daily as needed (shortness of breath).        Diagnostic Studies: No results found.  Disposition: Discharge disposition: 01-Home or Self Care       Discharge Instructions     Discharge patient   Complete by: As directed    Discharge disposition: 01-Home or Self Care   Discharge patient date: 10/14/2022        Follow-up Information     Esmeralda Malay, Candee Furbish, MD. Call in 1 week(s).   Specialty: Urology Contact information: [address]  [place] [address] [phone]                  Signed: Nicolette Bang 10/18/2022, 7:49 AM

## 2022-10-24 ENCOUNTER — Encounter (HOSPITAL_COMMUNITY): Payer: Self-pay | Admitting: Urology

## 2022-10-31 ENCOUNTER — Ambulatory Visit: Payer: Medicare HMO | Admitting: Urology

## 2022-10-31 DIAGNOSIS — Z5189 Encounter for other specified aftercare: Secondary | ICD-10-CM

## 2022-11-02 ENCOUNTER — Ambulatory Visit (HOSPITAL_COMMUNITY)
Admission: RE | Admit: 2022-11-02 | Discharge: 2022-11-02 | Disposition: A | Discharge status: Home / Self Care | Payer: Medicare HMO | Source: Ambulatory Visit | Attending: Hematology | Admitting: Hematology

## 2022-11-02 DIAGNOSIS — K8020 Calculus of gallbladder without cholecystitis without obstruction: Secondary | ICD-10-CM | POA: Diagnosis not present

## 2022-11-02 DIAGNOSIS — C3492 Malignant neoplasm of unspecified part of left bronchus or lung: Secondary | ICD-10-CM | POA: Diagnosis not present

## 2022-11-02 DIAGNOSIS — K8689 Other specified diseases of pancreas: Secondary | ICD-10-CM | POA: Diagnosis not present

## 2022-11-02 MED ORDER — IOHEXOL 350 MG/ML SOLN
100.0000 mL | Freq: Once | INTRAVENOUS | Status: AC | PRN
  Administered 2022-11-02: 100 mL via INTRAVENOUS

## 2022-11-06 NOTE — Progress Notes (Signed)
[facility] [address] [place] [address]    Clinic Day:  11/07/2022  Referring physician: Carrolyn Meiers*  Patient Care Team: Carrolyn Meiers, MD as PCP - General (Internal Medicine) Josue Hector, MD as PCP - Cardiology (Cardiology) Brien Mates, RN as Oncology Nurse Navigator (Oncology) Derek Jack, MD as Medical Oncologist (Medical Oncology)   ASSESSMENT & PLAN:   Assessment: 1.  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 Halford Chessman for left upper lobe lung mass. - Is current active smoker, 1/2 pack/day for 35 years. - CT lung cancer screening scan on 10/16/2020 showed cavitary mass in the anterior left upper lobe measuring 4.4 x 3.0 x 3.5 cm with 2.5 cm solid nodular component inferomedially.  This abuts and involves the anterior pleura/chest wall.  Expansile lytic lesion in the right posterolateral ninth and 10th ribs (unchanged dating back to CT from 06/16/2016).  10 mm short axis AP window node suspicious for nodal metastasis. - No change in baseline cough.  No hemoptysis.  No chest pain. - Brain MRI was negative for metastatic disease. - PET scan on 12/14/2020 showed left upper lobe mass measuring 3.9 x 4 cm with SUV 17.1.  Some contiguous hypermetabolism along the left chest wall including anterolateral left third rib with SUV 5.4.  No gross osseous destruction.  AP window lymph node of 9 mm, mildly hypermetabolic, SUV 2.8. - On 0000000 left upper lobe brushing and lavage consistent with squamous cell carcinoma. - Left upper lobe fine-needle aspiration was also consistent with squamous cell carcinoma.  Lymph node could not be reached. - Caris testing with limited tissue showed MMR proficient, BRAF V6 energy negative, PD-L1 negative by 22 C3, 28-8, SP142.  (IHC/CISH results should be interpreted with caution given the potential for false negative results) - 3 cycles of carboplatin,  paclitaxel and nivolumab from 01/13/2021 through 02/24/2021 based on [id number]-[id number]. - Left upper lobectomy and lymph node resection on 04/21/2021. - Pathology consistent with invasive moderately differentiated squamous cell carcinoma, 5 cm, margins negative.  Visceral pleura not involved.  Metastatic squamous cell carcinoma in 1/2 hilar lymph nodes.  Levels 5, 11, 12, 10, 11 and 12 lymph nodes were negative.  No definitive tumor response identified.  No lymphovascular invasion.  ypT2b, ypN1. - Caris testing on resection sample from 04/21/2021 shows PD-L1 TPS 1%.  No targetable mutations.  MSI stable.  TMB low.   2.  Social/family history: - Previously worked in [employer] fund[employer], currently on disability. - Current active smoker, 1/2 pack/day for 35 years. - Drinks 6 pack of beers along with 1 quart of liquor every day. - 1 brother had throat cancer and another brother had lung cancer.  Plan: 1.  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  Incidental pancreatic ductal dilatation present. - Recommend follow-up in 4 months with repeat CT scan of the chest with labs.   2.  Neuropathy: - Continue Cymbalta 60 mg daily.  Numbness in the fingertips and feet has been stable.  3.  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  No discrete mass lesion evident.  This is associated with dilatation of the main pancreatic duct and an abrupt smooth tapering of both the main pancreatic duct and common duct as  the tracking to ampulla. - I have recommended consultation with Dr. Rush Landmark for EUS and possible biopsy.  Orders Placed This Encounter  Procedures   CT Chest W Contrast    Standing Status:   Future    Standing Expiration Date:   11/07/2023    Order Specific Question:   If indicated for the ordered procedure, I authorize the  administration of contrast media per Radiology protocol    Answer:   Yes    Order Specific Question:   Does the patient have a contrast media/X-ray dye allergy?    Answer:   No    Order Specific Question:   Preferred imaging location?    Answer:   [facility]    Order Specific Question:   Release to patient    Answer:   Immediate [1]   CBC with Differential/Platelet    Standing Status:   Future    Standing Expiration Date:   11/07/2023    Order Specific Question:   Release to patient    Answer:   Immediate   Comprehensive metabolic panel    Standing Status:   Future    Standing Expiration Date:   11/07/2023    Order Specific Question:   Release to patient    Answer:   Immediate      I,Katie Daubenspeck,acting as a scribe for Derek Jack, MD.,have documented all relevant documentation on the behalf of Derek Jack, MD,as directed by  Derek Jack, MD while in the presence of Derek Jack, MD.   I, Derek Jack MD, have reviewed the above documentation for accuracy and completeness, and I agree with the above.   Derek Jack, MD   4/1/20245:15 PM  CHIEF COMPLAINT:   Diagnosis: bronchogenic carcnioma in left upper lobe of lung    Cancer Staging  Primary lung squamous cell carcinoma, left Staging form: Lung, AJCC 8th Edition - Clinical stage from 12/31/2020: Stage IIIA (cT2b, cN2, cM0) - Signed by Derek Jack, MD on 01/01/2021    Prior Therapy: carboplatin, paclitaxel and nivolumab q21d x3 cycles   Current Therapy:  surveillance    HISTORY OF PRESENT ILLNESS:   Oncology History  Primary lung squamous cell carcinoma, left  12/31/2020 Cancer Staging   Staging form: Lung, AJCC 8th Edition - Clinical stage from 12/31/2020: Stage IIIA (cT2b, cN2, cM0) - Signed by Derek Jack, MD on 01/01/2021 Stage prefix: Initial diagnosis   01/01/2021 Initial Diagnosis   Primary lung squamous cell carcinoma, left ([place])    01/13/2021 - 02/24/2021 Chemotherapy   Patient is on Treatment Plan : LUNG NSCLC Carboplatin / Paclitaxel q21d x 3 cycles        INTERVAL HISTORY:   Bryan Fleming is a 62 y.o. male presenting to clinic today for follow up of bronchogenic carcnioma in left upper lobe of lung. He was last seen by me on 09/26/22.  Since his last visit, he underwent abdomen CT on 11/02/22 for follow up of pancreatic ductal dilatation seen on chest CT from 09/15/22. This showed: 1.4 cm focus of hypo-enhancement/attenuation in region of ampulla without discrete mass lesion, associated with dilatation of main pancreatic duct.  Of note, he also underwent surgery for removal of penile prosthesis on 10/13/22 under Dr. Alyson Ingles.  Today, he states that he is doing well overall. His appetite level is at 75%. His energy level is at 75%.  PAST MEDICAL HISTORY:   Past Medical History: Past Medical History:  Diagnosis Date   Cancer    Lung cancer  COPD (chronic obstructive pulmonary disease) 2007   EMPHYSEMA   Depression    Dyspnea    occasional - no oxygen   GERD (gastroesophageal reflux disease) 2010   Hepatitis    Alcoholic    HLD (hyperlipidemia)    no meds   Hypertension    Port-A-Cath in place 01/01/2021    Surgical History: Past Surgical History:  Procedure Laterality Date   BRONCHIAL BIOPSY  12/23/2020   Procedure: BRONCHIAL BIOPSIES;  Surgeon: [name], MD;  Location: [place];  Service: Pulmonary;;   BRONCHIAL BRUSHINGS  12/23/2020   Procedure: BRONCHIAL BRUSHINGS;  Surgeon: [name], MD;  Location: [place];  Service: Pulmonary;;   BRONCHIAL NEEDLE ASPIRATION BIOPSY  12/23/2020   Procedure: BRONCHIAL NEEDLE ASPIRATION BIOPSIES;  Surgeon: [name], MD;  Location: [facility] ENDOSCOPY;  Service: Pulmonary;;   BRONCHIAL WASHINGS  12/23/2020   Procedure: BRONCHIAL WASHINGS;  Surgeon: [name], MD;  Location: [facility] ENDOSCOPY;  Service: Pulmonary;;   CARDIAC CATHETERIZATION  2010    COLONOSCOPY  03/14/2012   Procedure: COLONOSCOPY;  Surgeon: Rogene Houston, MD;  [facility];  Service: Endoscopy;  Laterality: N/A;  100   INTERCOSTAL NERVE BLOCK  04/21/2021   Procedure: INTERCOSTAL NERVE BLOCK;  Surgeon: Melrose Nakayama, MD;  Location: [place];  Service: Thoracic;;   IR IMAGING GUIDED PORT INSERTION  01/12/2021   LUNG REMOVAL, PARTIAL  08/2021   PENILE PROSTHESIS IMPLANT N/A 02/17/2022   Procedure: PENILE PROTHESIS INFLATABLE;  Surgeon: Cleon Gustin, MD;  Location: [facility];  Service: Urology;  Laterality: N/A;   PORTACATH PLACEMENT  07/2021   REMOVAL OF PENILE PROSTHESIS N/A 10/13/2022   Procedure: REMOVAL OF PENILE PROSTHESIS;  Surgeon: Cleon Gustin, MD;  Location: [facility];  Service: Urology;  Laterality: N/A;   VIDEO ASSISTED THORACOSCOPY (VATS)/ LOBECTOMY Left 04/21/2021   Procedure: VIDEO ASSISTED THORACOSCOPY (VATS)/ LOBECTOMY;  Surgeon: Melrose Nakayama, MD;  Location: [facility] OR;  Service: Thoracic;  Laterality: Left;   VIDEO BRONCHOSCOPY WITH ENDOBRONCHIAL NAVIGATION N/A 12/23/2020   Procedure: VIDEO BRONCHOSCOPY WITH ENDOBRONCHIAL NAVIGATION;  Surgeon: [name], MD;  Location: [place] ENDOSCOPY;  Service: Pulmonary;  Laterality: N/A;   WRIST GANGLION EXCISION  1985    Social History: Social History   Socioeconomic History   Marital status: Married    Spouse name: Not on file   Number of children: Not on file   Years of education: Not on file   Highest education level: Not on file  Occupational History   Not on file  Tobacco Use   Smoking status: Every Day    Packs/day: 1.00    Years: 37.00    Additional pack years: 0.00    Total pack years: 37.00    Types: Cigarettes   Smokeless tobacco: Never   Tobacco comments:    smokes 1/2 pack a day [facility] 03/08/21  Vaping Use   Vaping Use: Never used  Substance and Sexual Activity   Alcohol use: Yes    Alcohol/week: 56.0 standard drinks of alcohol    Types: 35 Cans of beer, 21 Shots  of liquor per week    Comment: 5-6 beers and 2-3 shots daily-04/19/21   Drug use: No   Sexual activity: Yes  Other Topics Concern   Not on file  Social History Narrative   Not on file   Social Determinants of Health   Financial Resource Strain: Not on file  Food Insecurity: Not on file  Transportation Needs: No Transportation Needs (12/03/2020)   PRAPARE - Hydrologist (Medical): No    Lack of Transportation (Non-Medical): No  Physical Activity: Inactive (12/03/2020)   Exercise Vital Sign    Days of Exercise per Week: 0 days    Minutes of Exercise per Session: 0 min  Stress: Not on file  Social Connections: Not on file  Intimate Partner Violence: Not At Risk (12/03/2020)   Humiliation, Afraid, Rape, and Kick questionnaire    Fear of Current or Ex-Partner: No    Emotionally Abused: No    Physically Abused: No    Sexually Abused: No    Family History: Family History  Problem Relation Age of Onset   Hypertension Mother    Asthma Father    Diabetes Sister    Hypertension Sister    Hypertension Brother    Hypertension Sister    Lupus Daughter     Current Medications:  Current Outpatient Medications:    acetaminophen (TYLENOL) 500 MG tablet, Take 2 tablets (1,000 mg total) by mouth every 6 (six) hours as needed. (Patient taking differently: Take 500 mg by mouth every 6 (six) hours as needed.), Disp: 30 tablet, Rfl: 0   amLODipine (NORVASC) 5 MG tablet, Take 5 mg by mouth daily., Disp: , Rfl:    DULoxetine (CYMBALTA) 30 MG capsule, Take 2 capsules (60 mg total) by mouth daily., Disp: 60 capsule, Rfl: 3   Fluticasone-Umeclidin-Vilant (TRELEGY ELLIPTA) 100-62.5-25 MCG/INH AEPB, Inhale 1 puff into the lungs daily as needed (shortness of breath)., Disp: , Rfl:    gabapentin (NEURONTIN) 300 MG capsule, Take 1 capsule (300 mg total) by mouth 3 (three) times daily., Disp: 90 capsule, Rfl: 3   lidocaine (XYLOCAINE) 2 % solution, Use as directed 15 mLs in  the mouth or throat as needed for mouth pain., Disp: 100 mL, Rfl: 0   metoprolol tartrate (LOPRESSOR) 50 MG tablet, Take 50 mg by mouth daily., Disp: , Rfl:    Multiple Vitamin (MULTIVITAMIN WITH MINERALS) TABS tablet, Take 1 tablet by mouth daily., Disp: , Rfl:    oxyCODONE (OXY IR/ROXICODONE) 5 MG immediate release tablet, Take 1 tablet (5 mg total) by mouth every 4 (four) hours as needed for moderate pain., Disp: 30 tablet, Rfl: 0   PROVENTIL HFA 108 (90 Base) MCG/ACT inhaler, INHALE 2 PUFFS BY MOUTH EVERY 6 HOURS AS NEEDED FOR COUGHING, WHEEZING, OR SHORTNESS OF BREATH, Disp: 20.1 g, Rfl: 1   sulfamethoxazole-trimethoprim (BACTRIM DS) 800-160 MG tablet, Take 1 tablet by mouth 2 (two) times daily., Disp: 14 tablet, Rfl: 0   Allergies: Allergies  Allergen Reactions   Other Nausea Only and Other (See Comments)    No spicy foods- Patient cannot tolerate- HEARTBURN    REVIEW OF SYSTEMS:   Review of Systems  Constitutional:  Negative for chills, fatigue and fever.  HENT:   Negative for lump/mass, mouth sores, nosebleeds, sore throat and trouble swallowing.   Eyes:  Negative for eye problems.  Respiratory:  Positive for cough and shortness of breath.   Cardiovascular:  Negative for chest pain, leg swelling and palpitations.  Gastrointestinal:  Negative for abdominal pain, constipation, diarrhea, nausea and vomiting.  Genitourinary:  Negative for bladder incontinence, difficulty urinating, dysuria, frequency, hematuria and nocturia.   Musculoskeletal:  Negative for arthralgias, back pain, flank pain, myalgias and neck pain.  Skin:  Negative for itching and rash.  Neurological:  Positive for numbness. Negative for dizziness and headaches.  Hematological:  Does  not bruise/bleed easily.  Psychiatric/Behavioral:  Negative for depression, sleep disturbance and suicidal ideas. The patient is not nervous/anxious.   All other systems reviewed and are negative.    VITALS:   Blood pressure (!)  152/93, pulse (!) 110, temperature 99.1 F (37.3 C), temperature source Oral, resp. rate 19, weight 152 lb (68.9 kg), SpO2 97 %.  Wt Readings from Last 3 Encounters:  11/07/22 152 lb (68.9 kg)  10/13/22 157 lb 10.1 oz (71.5 kg)  10/10/22 157 lb 12.8 oz (71.6 kg)    Body mass index is 22.45 kg/m.  Performance status (ECOG): 1 - Symptomatic but completely ambulatory  PHYSICAL EXAM:   Physical Exam Vitals and nursing note reviewed. Exam conducted with a chaperone present.  Constitutional:      Appearance: Normal appearance.  Cardiovascular:     Rate and Rhythm: Normal rate and regular rhythm.     Pulses: Normal pulses.     Heart sounds: Normal heart sounds.  Pulmonary:     Effort: Pulmonary effort is normal.     Breath sounds: Normal breath sounds.  Abdominal:     Palpations: Abdomen is soft. There is no hepatomegaly, splenomegaly or mass.     Tenderness: There is no abdominal tenderness.  Musculoskeletal:     Right lower leg: No edema.     Left lower leg: No edema.  Lymphadenopathy:     Cervical: No cervical adenopathy.     Right cervical: No superficial, deep or posterior cervical adenopathy.    Left cervical: No superficial, deep or posterior cervical adenopathy.     Upper Body:     Right upper body: No supraclavicular or axillary adenopathy.     Left upper body: No supraclavicular or axillary adenopathy.  Neurological:     General: No focal deficit present.     Mental Status: He is alert and oriented to person, place, and time.  Psychiatric:        Mood and Affect: Mood normal.        Behavior: Behavior normal.     LABS:      Latest Ref Rng & Units 10/14/2022    3:58 AM 08/31/2022    2:00 PM 02/18/2022    4:25 AM  CBC  WBC 4.0 - 10.5 K/uL 11.1  8.7  10.5   Hemoglobin 13.0 - 17.0 g/dL 14.5  15.4  14.0   Hematocrit 39.0 - 52.0 % 43.6  45.9  44.1   Platelets 150 - 400 K/uL 217  262  226       Latest Ref Rng & Units 10/14/2022    3:58 AM 08/31/2022    2:00 PM  02/18/2022    4:25 AM  CMP  Glucose 70 - 99 mg/dL 135  97  136   BUN 6 - 20 mg/dL 8  7  9    Creatinine 0.61 - 1.24 mg/dL 0.62  0.65  0.63   Sodium 135 - 145 mmol/L 134  138  137   Potassium 3.5 - 5.1 mmol/L 3.6  4.2  4.1   Chloride 98 - 111 mmol/L 98  103  102   CO2 22 - 32 mmol/L 26  25  27    Calcium 8.9 - 10.3 mg/dL 8.6  9.2  8.6   Total Protein 6.5 - 8.1 g/dL  8.0    Total Bilirubin 0.3 - 1.2 mg/dL  0.6    Alkaline Phos 38 - 126 U/L  85    AST 15 - 41 U/L  34    ALT 0 - 44 U/L  18       No results found for: "CEA1", "CEA" / No results found for: "CEA1", "CEA" No results found for: "PSA1" No results found for: "[id number]" No results found for: "CAN125"  No results found for: "TOTALPROTELP", "ALBUMINELP", "A1GS", "A2GS", "BETS", "BETA2SER", "GAMS", "MSPIKE", "SPEI" No results found for: "TIBC", "FERRITIN", "IRONPCTSAT" No results found for: "LDH"   STUDIES:   CT ABDOMEN W WO CONTRAST  Result Date: 11/03/2022 CLINICAL DATA:  Pancreatic ductal dilatation on recent chest CT. EXAM: CT ABDOMEN WITHOUT AND WITH CONTRAST TECHNIQUE: Multidetector CT imaging of the abdomen was performed following the standard protocol before and following the bolus administration of intravenous contrast. RADIATION DOSE REDUCTION: This exam was performed according to the departmental dose-optimization program which includes automated exposure control, adjustment of the mA and/or kV according to patient size and/or use of iterative reconstruction technique. CONTRAST:  159mL OMNIPAQUE IOHEXOL 350 MG/ML SOLN COMPARISON:  Chest CT 09/15/2022.  MRI abdomen 09/25/2020. FINDINGS: Lower chest: Emphysema. Hepatobiliary: No suspicious focal abnormality within the liver parenchyma. Small gallstones are noted in the gallbladder. No intrahepatic or extrahepatic biliary dilation. Common bile duct upper normal at 6 mm diameter in the head of the pancreas with abrupt smooth tapering into the region of the ampulla. Pancreas: Main  pancreatic duct in the head of the pancreas is dilated up to 6 mm diameter and remains distended through to the tip of the pancreatic tail although it does taper towards the tail. Pancreatic duct abruptly tapers into the ampulla as well. 1.4 x 1.1 cm focus of hypoenhancement/hypo attenuation is identified in the region of the ampulla (see axial image 34/4 and coronal image 49/7). No discrete mass lesion evident. Spleen: No splenomegaly. No focal mass lesion. Adrenals/Urinary Tract: Adrenal thickening without a discrete nodule or mass. Kidneys unremarkable. Stomach/Bowel: Stomach is unremarkable. No gastric wall thickening. No evidence of outlet obstruction. Duodenum is normally positioned as is the ligament of Treitz. No small bowel or colonic dilatation within the visualized abdomen. Vascular/Lymphatic: There is moderate atherosclerotic calcification of the abdominal aorta without aneurysm. There is no gastrohepatic or hepatoduodenal ligament lymphadenopathy. No retroperitoneal or mesenteric lymphadenopathy. Other: No intraperitoneal free fluid. Musculoskeletal: No worrisome lytic or sclerotic osseous abnormality. IMPRESSION: 1. 1.4 x 1.1 cm focus of hypoenhancement/hypo attenuation is identified in the region of the ampulla. No discrete mass lesion evident. This is associated with dilatation of the main pancreatic duct an abrupt smooth tapering of both the main pancreatic duct and common duct as they track into the ampulla. MRI/MRCP of the abdomen may prove helpful to further evaluate although endoscopic ultrasound to assess for ampullary lesion would likely be the most helpful next step. 2. Cholelithiasis. 3. Emphysema (ICD10-J43.9) and Aortic Atherosclerosis (ICD10-170.0) Electronically Signed   By: Misty Stanley M.D.   On: 11/03/2022 11:51

## 2022-11-07 ENCOUNTER — Inpatient Hospital Stay: Payer: Medicare HMO | Admitting: Dietician

## 2022-11-07 ENCOUNTER — Inpatient Hospital Stay: Payer: Medicare HMO | Attending: Hematology | Admitting: Hematology

## 2022-11-07 ENCOUNTER — Encounter: Payer: Self-pay | Admitting: Hematology

## 2022-11-07 VITALS — BP 152/93 | HR 110 | Temp 99.1°F | Resp 19 | Wt 152.0 lb

## 2022-11-07 DIAGNOSIS — R000 Tachycardia, unspecified: Secondary | ICD-10-CM | POA: Diagnosis not present

## 2022-11-07 DIAGNOSIS — F411 Generalized anxiety disorder: Secondary | ICD-10-CM | POA: Diagnosis not present

## 2022-11-07 DIAGNOSIS — Z800 Family history of malignant neoplasm of digestive organs: Secondary | ICD-10-CM | POA: Diagnosis not present

## 2022-11-07 DIAGNOSIS — I10 Essential (primary) hypertension: Secondary | ICD-10-CM | POA: Diagnosis not present

## 2022-11-07 DIAGNOSIS — I739 Peripheral vascular disease, unspecified: Secondary | ICD-10-CM | POA: Diagnosis not present

## 2022-11-07 DIAGNOSIS — C3492 Malignant neoplasm of unspecified part of left bronchus or lung: Secondary | ICD-10-CM

## 2022-11-07 DIAGNOSIS — Z85118 Personal history of other malignant neoplasm of bronchus and lung: Secondary | ICD-10-CM | POA: Insufficient documentation

## 2022-11-07 DIAGNOSIS — G629 Polyneuropathy, unspecified: Secondary | ICD-10-CM | POA: Diagnosis not present

## 2022-11-07 DIAGNOSIS — Z801 Family history of malignant neoplasm of trachea, bronchus and lung: Secondary | ICD-10-CM | POA: Insufficient documentation

## 2022-11-07 DIAGNOSIS — J439 Emphysema, unspecified: Secondary | ICD-10-CM | POA: Diagnosis not present

## 2022-11-07 DIAGNOSIS — K219 Gastro-esophageal reflux disease without esophagitis: Secondary | ICD-10-CM | POA: Diagnosis not present

## 2022-11-07 DIAGNOSIS — Z8249 Family history of ischemic heart disease and other diseases of the circulatory system: Secondary | ICD-10-CM | POA: Diagnosis not present

## 2022-11-07 DIAGNOSIS — F17210 Nicotine dependence, cigarettes, uncomplicated: Secondary | ICD-10-CM | POA: Insufficient documentation

## 2022-11-07 DIAGNOSIS — I2510 Atherosclerotic heart disease of native coronary artery without angina pectoris: Secondary | ICD-10-CM | POA: Diagnosis not present

## 2022-11-07 DIAGNOSIS — J309 Allergic rhinitis, unspecified: Secondary | ICD-10-CM | POA: Diagnosis not present

## 2022-11-07 DIAGNOSIS — R69 Illness, unspecified: Secondary | ICD-10-CM | POA: Diagnosis not present

## 2022-11-07 DIAGNOSIS — E785 Hyperlipidemia, unspecified: Secondary | ICD-10-CM | POA: Diagnosis not present

## 2022-11-07 DIAGNOSIS — N529 Male erectile dysfunction, unspecified: Secondary | ICD-10-CM | POA: Diagnosis not present

## 2022-11-07 DIAGNOSIS — F3341 Major depressive disorder, recurrent, in partial remission: Secondary | ICD-10-CM | POA: Diagnosis not present

## 2022-11-07 NOTE — Progress Notes (Signed)
Nutrition Assessment   Reason for Assessment: Pt request   ASSESSMENT: 62 year old male completed chemotherapy for SCC of left lung (01/13/21-02/24/21)  Past medical history includes HTN, COPD, GERD, hepatitis, alcohol abuse   Met with patient in clinic. He reports doing well overall. Patient says appetite comes and goes. He recalls eating twice daily (boiled eggs, toast, coffee for breakfast, wings, mac/cheese, fish, pork chops, steak for dinner) Patient sometimes will snack. He is drinking one bottle of water, coffee, and tea. Patient endorses daily alcohol consumption. Recalls 5-6 beers on the weekdays. Says he drinks more on the weekend. Patient is here today because he would like case of Ensure.   Nutrition Focused Physical Exam: deferred    Medications: gabapentin, lopressor, MVI, oxycodone, cymbalta, amlodipine   Labs: 3/8 - Na 134, glucose 135   Anthropometrics:   Height: 5'9" Weight: 152 lb UBW: 152-157 lb  BMI: 22.45   NUTRITION DIAGNOSIS: Suboptimal oral intake related to social/environmental status as evidenced by pt reported daily EtOH consumption   INTERVENTION:  Unable to provide pt with case of Ensure. Fairly stable weight history s/p completing treatment in July 2022. Explained to pt Ensure cases are indicated for pts in active treatment experiencing nutrition impact symptoms. He is understanding of this Encouraged pt to contact [facility] to see what assistance/programs he may be eligible for - contact information given   Next Visit: No follow-up scheduled

## 2022-11-07 NOTE — Patient Instructions (Signed)
[facility]  Discharge Instructions  You were seen and examined today by Dr. Delton Coombes.  Dr. Delton Coombes discussed your most recent lab work and CT scan which revealed that you have a possible mass in the duct of your pancreas.   Dr. Delton Coombes is going to refer you to Dr. Rush Landmark to have a possible endoscopy and biopsy.  Follow-up as scheduled in 4 months with CT chest and labs prior.    Thank you for choosing [facility] to provide your oncology and hematology care.   To afford each patient quality time with our provider, please arrive at least 15 minutes before your scheduled appointment time. You may need to reschedule your appointment if you arrive late (10 or more minutes). Arriving late affects you and other patients whose appointments are after yours.  Also, if you miss three or more appointments without notifying the office, you may be dismissed from the clinic at the provider's discretion.    Again, thank you for choosing [facility].  Our hope is that these requests will decrease the amount of time that you wait before being seen by our physicians.   If you have a lab appointment with the [facility] please come in thru the Main Entrance and check in at the main information desk.           _____________________________________________________________  Should you have questions after your visit to [facility], please contact our office at [phone] and follow the prompts.  Our office hours are 8:00 a.m. to 4:30 p.m. Monday - Thursday and 8:00 a.m. to 2:30 p.m. Friday.  Please note that voicemails left after 4:00 p.m. may not be returned until the following business day.  We are closed weekends and all major holidays.  You do have access to a nurse 24-7, just call the main number to the clinic [phone] and do not press any options, hold on the line and a nurse will answer the phone.     For prescription refill requests, have your pharmacy contact our office and allow 72 hours.    Masks are optional in the cancer centers. If you would like for your care team to wear a mask while they are taking care of you, please let them know. You may have one support person who is at least 61 years old accompany you for your appointments.

## 2022-11-11 DIAGNOSIS — K219 Gastro-esophageal reflux disease without esophagitis: Secondary | ICD-10-CM | POA: Diagnosis not present

## 2022-11-11 DIAGNOSIS — I10 Essential (primary) hypertension: Secondary | ICD-10-CM | POA: Diagnosis not present

## 2022-12-01 ENCOUNTER — Inpatient Hospital Stay (HOSPITAL_COMMUNITY)
Admission: EM | Admit: 2022-12-01 | Discharge: 2022-12-06 | DRG: 988 | Disposition: A | Discharge status: Home / Self Care | Payer: Medicare HMO | Attending: Internal Medicine | Admitting: Internal Medicine

## 2022-12-01 ENCOUNTER — Encounter (HOSPITAL_COMMUNITY): Payer: Self-pay | Admitting: Internal Medicine

## 2022-12-01 ENCOUNTER — Other Ambulatory Visit: Payer: Self-pay

## 2022-12-01 ENCOUNTER — Telehealth: Payer: Self-pay

## 2022-12-01 DIAGNOSIS — Y732 Prosthetic and other implants, materials and accessory gastroenterology and urology devices associated with adverse incidents: Secondary | ICD-10-CM | POA: Diagnosis present

## 2022-12-01 DIAGNOSIS — Z8249 Family history of ischemic heart disease and other diseases of the circulatory system: Secondary | ICD-10-CM | POA: Diagnosis not present

## 2022-12-01 DIAGNOSIS — F17200 Nicotine dependence, unspecified, uncomplicated: Secondary | ICD-10-CM | POA: Diagnosis not present

## 2022-12-01 DIAGNOSIS — T8131XA Disruption of external operation (surgical) wound, not elsewhere classified, initial encounter: Principal | ICD-10-CM | POA: Diagnosis present

## 2022-12-01 DIAGNOSIS — F1010 Alcohol abuse, uncomplicated: Secondary | ICD-10-CM | POA: Diagnosis present

## 2022-12-01 DIAGNOSIS — K219 Gastro-esophageal reflux disease without esophagitis: Secondary | ICD-10-CM | POA: Diagnosis present

## 2022-12-01 DIAGNOSIS — F419 Anxiety disorder, unspecified: Secondary | ICD-10-CM | POA: Diagnosis not present

## 2022-12-01 DIAGNOSIS — E785 Hyperlipidemia, unspecified: Secondary | ICD-10-CM | POA: Diagnosis not present

## 2022-12-01 DIAGNOSIS — J439 Emphysema, unspecified: Secondary | ICD-10-CM | POA: Diagnosis present

## 2022-12-01 DIAGNOSIS — R6883 Chills (without fever): Secondary | ICD-10-CM | POA: Diagnosis not present

## 2022-12-01 DIAGNOSIS — I10 Essential (primary) hypertension: Secondary | ICD-10-CM | POA: Diagnosis not present

## 2022-12-01 DIAGNOSIS — Z825 Family history of asthma and other chronic lower respiratory diseases: Secondary | ICD-10-CM

## 2022-12-01 DIAGNOSIS — J449 Chronic obstructive pulmonary disease, unspecified: Secondary | ICD-10-CM | POA: Diagnosis present

## 2022-12-01 DIAGNOSIS — R7303 Prediabetes: Secondary | ICD-10-CM | POA: Diagnosis not present

## 2022-12-01 DIAGNOSIS — T83410A Breakdown (mechanical) of penile (implanted) prosthesis, initial encounter: Secondary | ICD-10-CM | POA: Diagnosis not present

## 2022-12-01 DIAGNOSIS — F17218 Nicotine dependence, cigarettes, with other nicotine-induced disorders: Secondary | ICD-10-CM | POA: Diagnosis not present

## 2022-12-01 DIAGNOSIS — N529 Male erectile dysfunction, unspecified: Secondary | ICD-10-CM | POA: Diagnosis present

## 2022-12-01 DIAGNOSIS — E876 Hypokalemia: Secondary | ICD-10-CM | POA: Diagnosis not present

## 2022-12-01 DIAGNOSIS — T8130XA Disruption of wound, unspecified, initial encounter: Secondary | ICD-10-CM | POA: Diagnosis not present

## 2022-12-01 DIAGNOSIS — R739 Hyperglycemia, unspecified: Secondary | ICD-10-CM | POA: Diagnosis not present

## 2022-12-01 DIAGNOSIS — F32A Depression, unspecified: Secondary | ICD-10-CM | POA: Diagnosis present

## 2022-12-01 DIAGNOSIS — Z79899 Other long term (current) drug therapy: Secondary | ICD-10-CM

## 2022-12-01 DIAGNOSIS — Z85118 Personal history of other malignant neoplasm of bronchus and lung: Secondary | ICD-10-CM | POA: Diagnosis not present

## 2022-12-01 DIAGNOSIS — R69 Illness, unspecified: Secondary | ICD-10-CM | POA: Diagnosis not present

## 2022-12-01 DIAGNOSIS — Z902 Acquired absence of lung [part of]: Secondary | ICD-10-CM

## 2022-12-01 DIAGNOSIS — Z833 Family history of diabetes mellitus: Secondary | ICD-10-CM

## 2022-12-01 DIAGNOSIS — Z23 Encounter for immunization: Secondary | ICD-10-CM | POA: Diagnosis not present

## 2022-12-01 LAB — CBC
HCT: 44.1 % (ref 39.0–52.0)
Hemoglobin: 14.9 g/dL (ref 13.0–17.0)
MCH: 32 pg (ref 26.0–34.0)
MCHC: 33.8 g/dL (ref 30.0–36.0)
MCV: 94.6 fL (ref 80.0–100.0)
Platelets: 179 10*3/uL (ref 150–400)
RBC: 4.66 MIL/uL (ref 4.22–5.81)
RDW: 14.4 % (ref 11.5–15.5)
WBC: 6.7 10*3/uL (ref 4.0–10.5)
nRBC: 0 % (ref 0.0–0.2)

## 2022-12-01 LAB — BASIC METABOLIC PANEL
Anion gap: 9 (ref 5–15)
BUN: 7 mg/dL (ref 6–20)
CO2: 25 mmol/L (ref 22–32)
Calcium: 8.9 mg/dL (ref 8.9–10.3)
Chloride: 100 mmol/L (ref 98–111)
Creatinine, Ser: 0.58 mg/dL — ABNORMAL LOW (ref 0.61–1.24)
GFR, Estimated: >60 mL/min (ref >60)
Glucose, Bld: 117 mg/dL — ABNORMAL HIGH (ref 70–99)
Potassium: 3.6 mmol/L (ref 3.5–5.1)
Sodium: 134 mmol/L — ABNORMAL LOW (ref 135–145)

## 2022-12-01 LAB — CBG MONITORING, ED: Glucose-Capillary: 130 mg/dL — ABNORMAL HIGH (ref 70–99)

## 2022-12-01 MED ORDER — ACETAMINOPHEN 325 MG PO TABS
650.0000 mg | ORAL_TABLET | Freq: Four times a day (QID) | ORAL | Status: DC | PRN
  Administered 2022-12-02 – 2022-12-04 (×4): 650 mg via ORAL
  Filled 2022-12-01 (×4): qty 2

## 2022-12-01 MED ORDER — THIAMINE HCL 100 MG/ML IJ SOLN
100.0000 mg | Freq: Every day | INTRAMUSCULAR | Status: DC
  Filled 2022-12-01: qty 2

## 2022-12-01 MED ORDER — FLUTICASONE FUROATE-VILANTEROL 100-25 MCG/ACT IN AEPB
1.0000 | INHALATION_SPRAY | Freq: Every day | RESPIRATORY_TRACT | Status: DC
  Administered 2022-12-02 – 2022-12-06 (×4): 1 via RESPIRATORY_TRACT
  Filled 2022-12-01 (×3): qty 28

## 2022-12-01 MED ORDER — VANCOMYCIN HCL IN DEXTROSE 1-5 GM/200ML-% IV SOLN
1000.0000 mg | Freq: Two times a day (BID) | INTRAVENOUS | Status: DC
  Administered 2022-12-02 – 2022-12-05 (×8): 1000 mg via INTRAVENOUS
  Filled 2022-12-01 (×8): qty 200

## 2022-12-01 MED ORDER — FOLIC ACID 1 MG PO TABS
1.0000 mg | ORAL_TABLET | Freq: Every day | ORAL | Status: DC
  Administered 2022-12-01 – 2022-12-06 (×5): 1 mg via ORAL
  Filled 2022-12-01 (×5): qty 1

## 2022-12-01 MED ORDER — TRAZODONE HCL 50 MG PO TABS
25.0000 mg | ORAL_TABLET | Freq: Every evening | ORAL | Status: DC | PRN
  Administered 2022-12-02 – 2022-12-04 (×3): 25 mg via ORAL
  Filled 2022-12-01 (×3): qty 1

## 2022-12-01 MED ORDER — ACETAMINOPHEN 650 MG RE SUPP: 650.0000 mg | Freq: Four times a day (QID) | RECTAL | Status: DC | PRN

## 2022-12-01 MED ORDER — OXYCODONE HCL 5 MG PO TABS
5.0000 mg | ORAL_TABLET | Freq: Four times a day (QID) | ORAL | Status: DC | PRN
  Administered 2022-12-02 – 2022-12-04 (×10): 5 mg via ORAL
  Filled 2022-12-01 (×11): qty 1

## 2022-12-01 MED ORDER — HYDROCODONE-ACETAMINOPHEN 5-325 MG PO TABS
1.0000 | ORAL_TABLET | Freq: Once | ORAL | Status: AC
  Administered 2022-12-01: 1 via ORAL
  Filled 2022-12-01: qty 1

## 2022-12-01 MED ORDER — HEPARIN SODIUM (PORCINE) 5000 UNIT/ML IJ SOLN
5000.0000 [IU] | Freq: Three times a day (TID) | INTRAMUSCULAR | Status: DC
  Administered 2022-12-02 – 2022-12-04 (×5): 5000 [IU] via SUBCUTANEOUS
  Filled 2022-12-01 (×5): qty 1

## 2022-12-01 MED ORDER — LORAZEPAM 2 MG/ML IJ SOLN: 1.0000 mg | INTRAMUSCULAR | Status: AC | PRN

## 2022-12-01 MED ORDER — NICOTINE 21 MG/24HR TD PT24
21.0000 mg | MEDICATED_PATCH | Freq: Once | TRANSDERMAL | Status: AC
  Administered 2022-12-01: 21 mg via TRANSDERMAL
  Filled 2022-12-01: qty 1

## 2022-12-01 MED ORDER — VANCOMYCIN HCL 1500 MG/300ML IV SOLN
1500.0000 mg | Freq: Once | INTRAVENOUS | Status: AC
  Administered 2022-12-01: 1500 mg via INTRAVENOUS
  Filled 2022-12-01: qty 300

## 2022-12-01 MED ORDER — ADULT MULTIVITAMIN W/MINERALS CH
1.0000 | ORAL_TABLET | Freq: Every day | ORAL | Status: DC
  Administered 2022-12-01 – 2022-12-06 (×5): 1 via ORAL
  Filled 2022-12-01 (×6): qty 1

## 2022-12-01 MED ORDER — AMLODIPINE BESYLATE 10 MG PO TABS
5.0000 mg | ORAL_TABLET | Freq: Every day | ORAL | Status: DC
  Administered 2022-12-02 – 2022-12-06 (×4): 5 mg via ORAL
  Filled 2022-12-01 (×4): qty 1

## 2022-12-01 MED ORDER — UMECLIDINIUM BROMIDE 62.5 MCG/ACT IN AEPB
1.0000 | INHALATION_SPRAY | Freq: Every day | RESPIRATORY_TRACT | Status: DC
  Administered 2022-12-02 – 2022-12-06 (×4): 1 via RESPIRATORY_TRACT
  Filled 2022-12-01 (×3): qty 7

## 2022-12-01 MED ORDER — RISAQUAD PO CAPS
2.0000 | ORAL_CAPSULE | Freq: Three times a day (TID) | ORAL | Status: DC
  Administered 2022-12-01 – 2022-12-06 (×12): 2 via ORAL
  Filled 2022-12-01 (×12): qty 2

## 2022-12-01 MED ORDER — THIAMINE MONONITRATE 100 MG PO TABS
100.0000 mg | ORAL_TABLET | Freq: Every day | ORAL | Status: DC
  Administered 2022-12-01 – 2022-12-06 (×5): 100 mg via ORAL
  Filled 2022-12-01 (×5): qty 1

## 2022-12-01 MED ORDER — HYDRALAZINE HCL 20 MG/ML IJ SOLN: 5.0000 mg | INTRAMUSCULAR | Status: DC | PRN

## 2022-12-01 MED ORDER — LORAZEPAM 1 MG PO TABS
1.0000 mg | ORAL_TABLET | ORAL | Status: AC | PRN
  Administered 2022-12-02 – 2022-12-03 (×2): 1 mg via ORAL
  Filled 2022-12-01 (×2): qty 1

## 2022-12-01 MED ORDER — PIPERACILLIN-TAZOBACTAM 3.375 G IVPB
3.3750 g | Freq: Three times a day (TID) | INTRAVENOUS | Status: DC
  Administered 2022-12-02 – 2022-12-04 (×9): 3.375 g via INTRAVENOUS
  Administered 2022-12-05: 3.375 mL via INTRAVENOUS
  Administered 2022-12-05 – 2022-12-06 (×3): 3.375 g via INTRAVENOUS
  Filled 2022-12-01 (×13): qty 50

## 2022-12-01 MED ORDER — PIPERACILLIN-TAZOBACTAM 3.375 G IVPB 30 MIN
3.3750 g | Freq: Once | INTRAVENOUS | Status: AC
  Administered 2022-12-01: 3.375 g via INTRAVENOUS
  Filled 2022-12-01: qty 50

## 2022-12-01 MED ORDER — PNEUMOCOCCAL 20-VAL CONJ VACC 0.5 ML IM SUSY
0.5000 mL | PREFILLED_SYRINGE | INTRAMUSCULAR | Status: AC
  Administered 2022-12-02: 0.5 mL via INTRAMUSCULAR
  Filled 2022-12-01: qty 0.5

## 2022-12-01 NOTE — ED Provider Notes (Addendum)
[place] EMERGENCY DEPARTMENT AT [facility] Provider Note   CSN: [id number] Arrival date & time: 12/01/22  1452     History  Chief Complaint  Patient presents with   Wound Check    Bryan Fleming is a 62 y.o. male.   Wound Check     Patient has a history of COPD lung cancer reflux, hepatitis, hypertension who on March 7 had surgery for removal of penile prosthesis.  Surgery was performed by Dr. Ronne Binning on March 7.  Patient states today he felt down in his scrotal area and felt the tube.  When he looked he noticed that the wound had opened up and he was able to see a tube from his surgery.  He denies any recent fevers.  He has not had any vomiting or diarrhea.  No other complaints patient states he tried to call the urologist office but did not get a call back.  He came to the ED for evaluation.  Home Medications Prior to Admission medications   Medication Sig Start Date End Date Taking? Authorizing Provider  acetaminophen (TYLENOL) 500 MG tablet Take 2 tablets (1,000 mg total) by mouth every 6 (six) hours as needed. Patient taking differently: Take 500 mg by mouth every 6 (six) hours as needed. 04/23/21  Yes Barrett, Erin R, PA-C  amLODipine (NORVASC) 5 MG tablet Take 5 mg by mouth daily. 04/16/20  Yes [provider]  Fluticasone-Umeclidin-Vilant (TRELEGY ELLIPTA) 100-62.5-25 MCG/INH AEPB Inhale 1 puff into the lungs daily as needed (shortness of breath).   Yes [provider]  lidocaine (XYLOCAINE) 2 % solution Use as directed 15 mLs in the mouth or throat as needed for mouth pain. 09/04/22  Yes Al Decant, PA-C  PROVENTIL HFA 108 (90 Base) MCG/ACT inhaler INHALE 2 PUFFS BY MOUTH EVERY 6 HOURS AS NEEDED FOR COUGHING, WHEEZING, OR SHORTNESS OF BREATH Patient taking differently: Inhale 2 puffs into the lungs every 6 (six) hours as needed for wheezing or shortness of breath. 11/13/17  Yes Jacquelin Hawking, PA-C  DULoxetine (CYMBALTA) 30 MG  capsule Take 2 capsules (60 mg total) by mouth daily. Patient not taking: Reported on 12/01/2022 09/26/22   Doreatha Massed, MD  gabapentin (NEURONTIN) 300 MG capsule Take 1 capsule (300 mg total) by mouth 3 (three) times daily. Patient not taking: Reported on 12/01/2022 03/04/22   Carnella Guadalajara, PA-C  metoprolol tartrate (LOPRESSOR) 50 MG tablet Take 50 mg by mouth daily. Patient not taking: Reported on 12/01/2022    [provider]  oxyCODONE (OXY IR/ROXICODONE) 5 MG immediate release tablet Take 1 tablet (5 mg total) by mouth every 4 (four) hours as needed for moderate pain. Patient not taking: Reported on 12/01/2022 10/14/22   [name], MD  sulfamethoxazole-trimethoprim (BACTRIM DS) 800-160 MG tablet Take 1 tablet by mouth 2 (two) times daily. Patient not taking: Reported on 12/01/2022 10/14/22   [name], MD  prochlorperazine (COMPAZINE) 10 MG tablet TAKE 1 TABLET BY MOUTH EVERY 6 HOURS AS NEEDED FOR NAUSEA FOR VOMITING 08/16/21 01/15/22  Doreatha Massed, MD      Allergies    Other    Review of Systems   Review of Systems  Physical Exam Updated Vital Signs BP 138/76   Pulse (!) 106   Temp 98.7 F (37.1 C)   Resp 20   Wt 68.9 kg   SpO2 98%   BMI 22.43 kg/m  Physical Exam Vitals and nursing note reviewed.  Constitutional:  General: He is not in acute distress.    Appearance: He is well-developed.  HENT:     Head: Normocephalic and atraumatic.     Right Ear: External ear normal.     Left Ear: External ear normal.  Eyes:     General: No scleral icterus.       Right eye: No discharge.        Left eye: No discharge.     Conjunctiva/sclera: Conjunctivae normal.  Neck:     Trachea: No tracheal deviation.  Cardiovascular:     Rate and Rhythm: Normal rate.  Pulmonary:     Effort: Pulmonary effort is normal. No respiratory distress.     Breath sounds: No stridor.  Abdominal:     General: There is no distension.  Musculoskeletal:         General: No swelling or deformity.     Cervical back: Neck supple.  Skin:    General: Skin is warm and dry.     Findings: No rash.  Neurological:     Mental Status: He is alert. Mental status is at baseline.     Cranial Nerves: No dysarthria or facial asymmetry.     Motor: No seizure activity.     ED Results / Procedures / Treatments   Labs (all labs ordered are listed, but only abnormal results are displayed) Labs Reviewed  BASIC METABOLIC PANEL - Abnormal; Notable for the following components:      Result Value   Sodium 134 (*)    Glucose, Bld 117 (*)    Creatinine, Ser 0.58 (*)    All other components within normal limits  CBG MONITORING, ED - Abnormal; Notable for the following components:   Glucose-Capillary 130 (*)    All other components within normal limits  CBC    EKG None  Radiology No results found.  Procedures Procedures    Medications Ordered in ED Medications  piperacillin-tazobactam (ZOSYN) IVPB 3.375 g (has no administration in time range)  vancomycin (VANCOREADY) IVPB 1500 mg/300 mL (1,500 mg Intravenous New Bag/Given 12/01/22 2105)  vancomycin (VANCOCIN) IVPB 1000 mg/200 mL premix (has no administration in time range)  piperacillin-tazobactam (ZOSYN) IVPB 3.375 g (0 g Intravenous Stopped 12/01/22 2106)  HYDROcodone-acetaminophen (NORCO/VICODIN) 5-325 MG per tablet 1 tablet (1 tablet Oral Given 12/01/22 2105)    ED Course/ Medical Decision Making/ A&P Clinical Course as of 12/01/22 2114  Thu Dec 01, 2022  9629 Reviewed case with Dr Ronne Binning.  Will plan on admitting to the hospital [JK]  1936 Discussed with Dr Ronne Binning.  Would like 4 days of IV abx before surgery.  He consulted with a colleague who would do that before surgical repair. [JK]  2100 CBC and metabolic panel unremarkable [JK]  2114 Case discussed with Dr. Randol Kern.  He will admit the patient to the hospital [JK]    Clinical Course User Index [JK] Linwood Dibbles, MD                              Medical Decision Making Problems Addressed: Wound dehiscence: acute illness or injury that poses a threat to life or bodily functions  Amount and/or Complexity of Data Reviewed Labs: ordered.  Risk Prescription drug management. Decision regarding hospitalization.   Patient appears to have a wound dehiscence in the scrotum at the base of penile shaft on the left side.  He has 2 small tubes that are visible and protruding from the  skin.  No overlying signs of erythema or drainage.  I will contact urology to discuss further treatment  Discussed the case with Dr. Ronne Binning.  Patient will need to go to the OR to have this area washed out.  He will need to have the wound closed again to try to salvage the prosthesis.  Dr. Ronne Binning will admit the patient to the hospital.  Have ordered CBC and metabolic panel we will start the patient on Vanco and Zosyn.        Final Clinical Impression(s) / ED Diagnoses Final diagnoses:  Wound dehiscence    Rx / DC Orders ED Discharge Orders     None         Linwood Dibbles, MD 12/01/22 Epimenio Foot    Linwood Dibbles, MD 12/01/22 2114

## 2022-12-01 NOTE — Progress Notes (Signed)
Pharmacy Antibiotic Note  Bryan Fleming is a 62 y.o. male admitted on 12/01/2022 with wound dehiscence in the scrotum at the base of penile shaft on the left side,  s/p surgery for removal of penile prosthesis on 10/13/22.  Pharmacy has been consulted for Zosyn and Vancomycin dosing for a wound infection.  Today's labs are pending.   Most recent SCr was 0.62 on 10/14/22  Plan: Zosyn 3.375 g IV every 8 hours.  Infuse each dose over 4 hours. Vancomycin 1500 mg IV  x1 dose then Vancomycin 1000 mg Q 12 hrs. Goal AUC 400-550. Expected AUC: 481 SCr used: 0.8  Monitor clinical progress, renal function, cultures daily.  Check steady state vancomycin levels per protocol if needed.    Weight: 68.9 kg (151 lb 14.4 oz)  Temp (24hrs), Avg:99.1 F (37.3 C), Min:99.1 F (37.3 C), Max:99.1 F (37.3 C)  No results for input(s): "WBC", "CREATININE", "LATICACIDVEN", "VANCOTROUGH", "VANCOPEAK", "VANCORANDOM", "GENTTROUGH", "GENTPEAK", "GENTRANDOM", "TOBRATROUGH", "TOBRAPEAK", "TOBRARND", "AMIKACINPEAK", "AMIKACINTROU", "AMIKACIN" in the last 168 hours.  CrCl cannot be calculated (Patient's most recent lab result is older than the maximum 21 days allowed.).    Allergies  Allergen Reactions   Other Nausea Only and Other (See Comments)    No spicy foods- Patient cannot tolerate- HEARTBURN    Antimicrobials this admission: Vancomycin 4/25>> Zosyn 4/25>>  Dose adjustments this admission:   Microbiology results:  None at this time   Thank you for allowing pharmacy to be a part of this patient's care. Noah Delaine, RPh Clinical Pharmacist 12/01/2022 7:27 PM

## 2022-12-01 NOTE — H&P (Addendum)
[facility] H&P   Patient Demographics:    Bryan Fleming, is a 61 y.o. male  MRN: [id number]   DOB - 1961-04-21  Admit Date - 12/01/2022  Outpatient Primary MD for the patient is [name], Wayland Salinas, MD  Referring MD/NP/PA: Dr Nira Conn  Outpatient Specialists: urology Dr Candida Peeling    Patient coming from: home  Chief Complaint  Patient presents with   Wound Check      HPI:    Bryan Fleming  is a 62 y.o. male, past medical history of COPD, lung cancer, GERD, hepatitis, pancreatitis in the past due to alcohol abuse, still drinking alcohol and smoking. -Patient came to ED for wound check, patient had revision of his penile prosthesis by Dr. Ronne Binning on March 7, patient fell down his scrotal area today and felt that Shoop, and he looked he noticed the wound has opened where he is able to see a tube from his surgery, he denies fever, chills, any foul-smelling odor or discharge, any penile pain, patient came to ED for evaluation - in ED significant lab abnormalities, is afebrile, upon discussion with Dr. Ronne Binning, he recommended admission for revision of his penile prosthesis, but patient will need 3 to 4 days of IV antibiotic administration prior to proceeding with surgery, so he requested throughout hospitalist to admit and to keep on IV vancomycin and Zosyn, and plan for surgery on Monday.    Review of systems:    A full 10 point Review of Systems was done, except as stated above, all other Review of Systems were negative.   With Past History of the following :    Past Medical History:  Diagnosis Date   Cancer (HCC)    Lung cancer   COPD (chronic obstructive pulmonary disease) (HCC) 2007   EMPHYSEMA   Depression    Dyspnea    occasional - no oxygen   GERD (gastroesophageal reflux disease) 2010   Hepatitis    Alcoholic    HLD (hyperlipidemia)    no meds    Hypertension    Port-A-Cath in place 01/01/2021      Past Surgical History:  Procedure Laterality Date   BRONCHIAL BIOPSY  12/23/2020   Procedure: BRONCHIAL BIOPSIES;  Surgeon: Leslye Peer, MD;  Location[facility] ENDOSCOPY;  Service: Pulmonary;;   BRONCHIAL BRUSHINGS  12/23/2020   Procedure: BRONCHIAL BRUSHINGS;  Surgeon: Leslye Peer, MD;  Location: [facility] ENDOSCOPY;  Service: Pulmonary;;   BRONCHIAL NEEDLE ASPIRATION BIOPSY  12/23/2020   Procedure: BRONCHIAL NEEDLE ASPIRATION BIOPSIES;  Surgeon: Leslye Peer, MD;  Location: [facility] ENDOSCOPY;  Service: Pulmonary;;   BRONCHIAL WASHINGS  12/23/2020   Procedure: BRONCHIAL WASHINGS;  Surgeon: Leslye Peer, MD;  Location: [facility] ENDOSCOPY;  Service: Pulmonary;;   CARDIAC CATHETERIZATION  2010   COLONOSCOPY  03/14/2012   Procedure: COLONOSCOPY;  Surgeon: Malissa Hippo, MD;  Location: [facility] ENDO SUITE;  Service: Endoscopy;  Laterality: N/A;  100   INTERCOSTAL NERVE BLOCK  04/21/2021   Procedure: INTERCOSTAL NERVE BLOCK;  Surgeon: Loreli Slot, MD;  Location: [facility] OR;  Service: Thoracic;;   IR IMAGING GUIDED PORT INSERTION  01/12/2021   LUNG REMOVAL, PARTIAL  08/2021   PENILE PROSTHESIS IMPLANT N/A 02/17/2022   Procedure: PENILE PROTHESIS INFLATABLE;  Surgeon: [name], MD;  Location: [facility] ORS;  Service: Urology;  Laterality: N/A;   PORTACATH PLACEMENT  07/2021   REMOVAL OF PENILE PROSTHESIS N/A 10/13/2022   Procedure: REMOVAL OF PENILE PROSTHESIS;  Surgeon: [name], MD;  Location: [facility] ORS;  Service: Urology;  Laterality: N/A;   VIDEO ASSISTED THORACOSCOPY (VATS)/ LOBECTOMY Left 04/21/2021   Procedure: VIDEO ASSISTED THORACOSCOPY (VATS)/ LOBECTOMY;  Surgeon: Loreli Slot, MD;  Location: [facility] OR;  Service: Thoracic;  Laterality: Left;   VIDEO BRONCHOSCOPY WITH ENDOBRONCHIAL NAVIGATION N/A 12/23/2020   Procedure: VIDEO BRONCHOSCOPY WITH ENDOBRONCHIAL NAVIGATION;  Surgeon: Leslye Peer, MD;  Location: [facility] ENDOSCOPY;   Service: Pulmonary;  Laterality: N/A;   WRIST GANGLION EXCISION  1985      Social History:     Social History   Tobacco Use   Smoking status: Every Day    Packs/day: 1.00    Years: 37.00    Additional pack years: 0.00    Total pack years: 37.00    Types: Cigarettes   Smokeless tobacco: Never   Tobacco comments:    smokes 1/2 pack a day [facility] 03/08/21  Substance Use Topics   Alcohol use: Yes    Alcohol/week: 56.0 standard drinks of alcohol    Types: 35 Cans of beer, 21 Shots of liquor per week    Comment: 5-6 beers and 2-3 shots daily-04/19/21      Family History :     Family History  Problem Relation Age of Onset   Hypertension Mother    Asthma Father    Diabetes Sister    Hypertension Sister    Hypertension Brother    Hypertension Sister    Lupus Daughter      Home Medications:   Prior to Admission medications   Medication Sig Start Date End Date Taking? Authorizing Provider  acetaminophen (TYLENOL) 500 MG tablet Take 2 tablets (1,000 mg total) by mouth every 6 (six) hours as needed. Patient taking differently: Take 500 mg by mouth every 6 (six) hours as needed. 04/23/21  Yes Barrett, Erin R, PA-C  amLODipine (NORVASC) 5 MG tablet Take 5 mg by mouth daily. 04/16/20  Yes [provider]  Fluticasone-Umeclidin-Vilant (TRELEGY ELLIPTA) 100-62.5-25 MCG/INH AEPB Inhale 1 puff into the lungs daily as needed (shortness of breath).   Yes [provider]  lidocaine (XYLOCAINE) 2 % solution Use as directed 15 mLs in the mouth or throat as needed for mouth pain. 09/04/22  Yes Al Decant, PA-C  PROVENTIL HFA 108 (90 Base) MCG/ACT inhaler INHALE 2 PUFFS BY MOUTH EVERY 6 HOURS AS NEEDED FOR COUGHING, WHEEZING, OR SHORTNESS OF BREATH Patient taking differently: Inhale 2 puffs into the lungs every 6 (six) hours as needed for wheezing or shortness of breath. 11/13/17  Yes Jacquelin Hawking, PA-C  DULoxetine (CYMBALTA) 30 MG capsule Take 2 capsules (60 mg total) by  mouth daily. Patient not taking: Reported on 12/01/2022 09/26/22   Doreatha Massed, MD  gabapentin (NEURONTIN) 300 MG capsule Take 1 capsule (300 mg total) by mouth 3 (three) times daily. Patient not taking: Reported on 12/01/2022 03/04/22   Carnella Guadalajara, PA-C  metoprolol tartrate (LOPRESSOR) 50 MG tablet Take 50 mg by mouth daily. Patient not taking: Reported on 12/01/2022    [provider]  oxyCODONE (OXY IR/ROXICODONE) 5 MG immediate release tablet Take 1 tablet (5 mg total) by mouth every 4 (four) hours as needed for moderate pain. Patient not taking: Reported on 12/01/2022 10/14/22   [name], MD  sulfamethoxazole-trimethoprim (BACTRIM DS) 800-160 MG tablet Take 1 tablet by mouth 2 (two) times daily. Patient not taking: Reported on 12/01/2022 10/14/22   [name], MD  prochlorperazine (COMPAZINE) 10 MG tablet TAKE 1 TABLET BY MOUTH EVERY 6 HOURS AS NEEDED FOR NAUSEA FOR VOMITING 08/16/21 01/15/22  Doreatha Massed, MD     Allergies:     Allergies  Allergen Reactions   Other Nausea Only and Other (See Comments)    No spicy foods- Patient cannot tolerate- HEARTBURN     Physical Exam:   Vitals  Blood pressure 135/70, pulse (!) 108, temperature 98.4 F (36.9 C), temperature source Oral, resp. rate 17, weight 68.9 kg, SpO2 94 %.   1. General developed male, laying in bed, no apparent distress  2. Normal affect and insight, Not Suicidal or Homicidal, Awake Alert, Oriented X 3.  3. No F.N deficits, ALL C.Nerves Intact, Strength 5/5 all 4 extremities, Sensation intact all 4 extremities, Plantars down going.  4. Ears and Eyes appear Normal, Conjunctivae clear, PERRLA. Moist Oral Mucosa.  5. Supple Neck, No JVD, No cervical lymphadenopathy appriciated, No Carotid Bruits.  6. Symmetrical Chest wall movement, Good air movement bilaterally, CTAB.  7. RRR, No Gallops, Rubs or Murmurs, No Parasternal Heave.  8. Positive Bowel Sounds, Abdomen Soft,  No tenderness, No organomegaly appriciated,No rebound -guarding or rigidity.  9.  No Cyanosis, Normal Skin Turgor, No Skin Rash or Bruise.  10. Good muscle tone,  joints appear normal , no effusions, Normal ROM.  scotal exam with evidence of proximal posterior area of his penile prosthesis, but no discharge or foul-smelling odor or surrounding erythema     Data Review:    CBC Recent Labs  Lab 12/01/22 2032  WBC 6.7  HGB 14.9  HCT 44.1  PLT 179  MCV 94.6  MCH 32.0  MCHC 33.8  RDW 14.4   ------------------------------------------------------------------------------------------------------------------  Chemistries  Recent Labs  Lab 12/01/22 2032  NA 134*  K 3.6  CL 100  CO2 25  GLUCOSE 117*  BUN 7  CREATININE 0.58*  CALCIUM 8.9   ------------------------------------------------------------------------------------------------------------------ estimated creatinine clearance is 95.7 mL/min (A) (by C-G formula based on SCr of 0.58 mg/dL (L)). ------------------------------------------------------------------------------------------------------------------ No results for input(s): "TSH", "T4TOTAL", "T3FREE", "THYROIDAB" in the last 72 hours.  Invalid input(s): "FREET3"  Coagulation profile No results for input(s): "INR", "PROTIME" in the last 168 hours. ------------------------------------------------------------------------------------------------------------------- No results for input(s): "DDIMER" in the last 72 hours. -------------------------------------------------------------------------------------------------------------------  Cardiac Enzymes No results for input(s): "CKMB", "TROPONINI", "MYOGLOBIN" in the last 168 hours.  Invalid input(s): "CK" ------------------------------------------------------------------------------------------------------------------ No results found for:  "BNP"   ---------------------------------------------------------------------------------------------------------------  Urinalysis    Component Value Date/Time   COLORURINE YELLOW 04/19/2021 1510   APPEARANCEUR HAZY (A) 04/19/2021 1510   LABSPEC 1.012 04/19/2021 1510   PHURINE 5.0 04/19/2021 1510   GLUCOSEU NEGATIVE 04/19/2021 1510   HGBUR NEGATIVE 04/19/2021 1510   BILIRUBINUR NEGATIVE 04/19/2021 1510   KETONESUR NEGATIVE 04/19/2021 1510   PROTEINUR NEGATIVE 04/19/2021 1510   UROBILINOGEN 0.2 10/28/2007 1435   NITRITE NEGATIVE 04/19/2021 1510   LEUKOCYTESUR NEGATIVE 04/19/2021 1510    ----------------------------------------------------------------------------------------------------------------  Imaging Results:    No results found.    Assessment & Plan:    Principal Problem:   Wound dehiscence Active Problems:   Hypertension   GERD (gastroesophageal reflux disease)   COPD (chronic obstructive pulmonary disease) (HCC)   Alcohol abuse   Nicotine dependence, cigarettes, with other nicotine-induced disorders   Erectile dysfunction   Wound dehiscence at scrotal/penile base area at site of recent penile prosthesis surgery -Discussed with urology, Dr. Ronne Binning, current recommendation for admission, with IV antibiotics administration for total of 3 to 4 days, then plan to exchange prosthesis likely on Monday. -urology has been consulted, they will evaluate in a.m.Marland Kitchen   Alcohol abuse -will start on CIWA protocol  Tobacco abuse -Counseled, will keep on nicotine patch  COPD -No active wheezing, continue with home medications, will keep on as needed albuterol  Hypertension -Blood pressure acceptable, continue with home medications  GERD - continue with PPI   DVT Prophylaxis Heparin   AM Labs Ordered, also please review Full Orders  Family Communication: Admission, patients condition and plan of care including tests being ordered have been discussed with the  patient  who indicate understanding and agree with the plan and Code Status.  Code Status Full  Likely DC to  home  Condition GUARDED    Consults called: urology Dr Candida Peeling    Admission status: inpatient    Time spent in minutes : 60 minutes   Huey Bienenstock M.D on 12/01/2022 at 9:31 PM   [facility] - Office  [phone]

## 2022-12-01 NOTE — Telephone Encounter (Signed)
Patient left voicemail, pt states he had penile implant surgery done with Dr. Ronne Binning.  Per his voicemail he is concerned because he has a tube showing.  He requested a call back before going to the ER.  Patient currently admitted to the hospital.

## 2022-12-01 NOTE — ED Triage Notes (Signed)
Pt had penile prosthesis removed in March and still has stiches in place. Pt states drainage from site. Pt unsure when he was supposed to have sutures removed and said no one called him and he has not followed up.

## 2022-12-02 ENCOUNTER — Other Ambulatory Visit: Payer: Self-pay | Admitting: Urology

## 2022-12-02 DIAGNOSIS — E876 Hypokalemia: Secondary | ICD-10-CM

## 2022-12-02 DIAGNOSIS — F17218 Nicotine dependence, cigarettes, with other nicotine-induced disorders: Secondary | ICD-10-CM

## 2022-12-02 DIAGNOSIS — I10 Essential (primary) hypertension: Secondary | ICD-10-CM

## 2022-12-02 DIAGNOSIS — T8130XA Disruption of wound, unspecified, initial encounter: Secondary | ICD-10-CM | POA: Diagnosis not present

## 2022-12-02 DIAGNOSIS — K219 Gastro-esophageal reflux disease without esophagitis: Secondary | ICD-10-CM

## 2022-12-02 DIAGNOSIS — J449 Chronic obstructive pulmonary disease, unspecified: Secondary | ICD-10-CM | POA: Diagnosis not present

## 2022-12-02 LAB — BASIC METABOLIC PANEL
Anion gap: 8 (ref 5–15)
BUN: 10 mg/dL (ref 6–20)
CO2: 24 mmol/L (ref 22–32)
Calcium: 8.8 mg/dL — ABNORMAL LOW (ref 8.9–10.3)
Chloride: 102 mmol/L (ref 98–111)
Creatinine, Ser: 0.49 mg/dL — ABNORMAL LOW (ref 0.61–1.24)
GFR, Estimated: >60 mL/min (ref >60)
Glucose, Bld: 134 mg/dL — ABNORMAL HIGH (ref 70–99)
Potassium: 3.3 mmol/L — ABNORMAL LOW (ref 3.5–5.1)
Sodium: 134 mmol/L — ABNORMAL LOW (ref 135–145)

## 2022-12-02 LAB — CBC
HCT: 43.7 % (ref 39.0–52.0)
Hemoglobin: 14.5 g/dL (ref 13.0–17.0)
MCH: 31.8 pg (ref 26.0–34.0)
MCHC: 33.2 g/dL (ref 30.0–36.0)
MCV: 95.8 fL (ref 80.0–100.0)
Platelets: 175 10*3/uL (ref 150–400)
RBC: 4.56 MIL/uL (ref 4.22–5.81)
RDW: 14.3 % (ref 11.5–15.5)
WBC: 7 10*3/uL (ref 4.0–10.5)
nRBC: 0 % (ref 0.0–0.2)

## 2022-12-02 LAB — HIV ANTIBODY (ROUTINE TESTING W REFLEX): HIV Screen 4th Generation wRfx: NONREACTIVE

## 2022-12-02 MED ORDER — POTASSIUM CHLORIDE CRYS ER 20 MEQ PO TBCR
40.0000 meq | EXTENDED_RELEASE_TABLET | ORAL | Status: AC
  Administered 2022-12-02 – 2022-12-03 (×2): 40 meq via ORAL
  Filled 2022-12-02 (×2): qty 2

## 2022-12-02 MED ORDER — MAGNESIUM SULFATE IN D5W 1-5 GM/100ML-% IV SOLN
1.0000 g | Freq: Once | INTRAVENOUS | Status: AC
  Administered 2022-12-02: 1 g via INTRAVENOUS
  Filled 2022-12-02: qty 100

## 2022-12-02 NOTE — Consult Note (Signed)
Urology Consult  Referring physician: Dr. Randol Kern Reason for referral: IPP malfunction  Chief Complaint: scrotal discomfort  History of Present Illness: [name] is a 62yo with a history of erectile dysfunction who presented to the ER last night with exposure of hiss IPP tubing. He underwent IPP replacement 7 weeks ago and was doing well with no swelling or issues with the incision until 2-3 days ago. He noted at that time a breakdown of the incision. He noted the tubing was exposed yesterday and he presented to the ER. He has mild pain over the tubing with palpation. He denies nay fevers or chills. WBC count 7.0. Blood sugars have been in the normal range.   Past Medical History:  Diagnosis Date   Cancer [facility])    Lung cancer   COPD (chronic obstructive pulmonary disease) (HCC) 2007   EMPHYSEMA   Depression    Dyspnea    occasional - no oxygen   GERD (gastroesophageal reflux disease) 2010   Hepatitis    Alcoholic    HLD (hyperlipidemia)    no meds   Hypertension    Port-A-Cath in place 01/01/2021   Past Surgical History:  Procedure Laterality Date   BRONCHIAL BIOPSY  12/23/2020   Procedure: BRONCHIAL BIOPSIES;  Surgeon: Leslye Peer, MD;  Location: [facility] ENDOSCOPY;  Service: Pulmonary;;   BRONCHIAL BRUSHINGS  12/23/2020   Procedure: BRONCHIAL BRUSHINGS;  Surgeon: Leslye Peer, MD;  Location: [facility] ENDOSCOPY;  Service: Pulmonary;;   BRONCHIAL NEEDLE ASPIRATION BIOPSY  12/23/2020   Procedure: BRONCHIAL NEEDLE ASPIRATION BIOPSIES;  Surgeon: Leslye Peer, MD;  Location: [facility] ENDOSCOPY;  Service: Pulmonary;;   BRONCHIAL WASHINGS  12/23/2020   Procedure: BRONCHIAL WASHINGS;  Surgeon: Leslye Peer, MD;  Location: [facility] ENDOSCOPY;  Service: Pulmonary;;   CARDIAC CATHETERIZATION  2010   COLONOSCOPY  03/14/2012   Procedure: COLONOSCOPY;  Surgeon: Malissa Hippo, MD;  Location: [facility] ENDO SUITE;  Service: Endoscopy;  Laterality: N/A;  100   INTERCOSTAL NERVE BLOCK  04/21/2021    Procedure: INTERCOSTAL NERVE BLOCK;  Surgeon: Loreli Slot, MD;  Location: [facility] OR;  Service: Thoracic;;   IR IMAGING GUIDED PORT INSERTION  01/12/2021   LUNG REMOVAL, PARTIAL  08/2021   PENILE PROSTHESIS IMPLANT N/A 02/17/2022   Procedure: PENILE PROTHESIS INFLATABLE;  Surgeon: [name], MD;  Location: [facility] ORS;  Service: Urology;  Laterality: N/A;   PORTACATH PLACEMENT  07/2021   REMOVAL OF PENILE PROSTHESIS N/A 10/13/2022   Procedure: REMOVAL OF PENILE PROSTHESIS;  Surgeon: [name], MD;  Location: [facility] ORS;  Service: Urology;  Laterality: N/A;   VIDEO ASSISTED THORACOSCOPY (VATS)/ LOBECTOMY Left 04/21/2021   Procedure: VIDEO ASSISTED THORACOSCOPY (VATS)/ LOBECTOMY;  Surgeon: Loreli Slot, MD;  Location: [facility] OR;  Service: Thoracic;  Laterality: Left;   VIDEO BRONCHOSCOPY WITH ENDOBRONCHIAL NAVIGATION N/A 12/23/2020   Procedure: VIDEO BRONCHOSCOPY WITH ENDOBRONCHIAL NAVIGATION;  Surgeon: Leslye Peer, MD;  Location: [facility] ENDOSCOPY;  Service: Pulmonary;  Laterality: N/A;   WRIST GANGLION EXCISION  1985    Medications: I have reviewed the patient's current medications. Allergies:  Allergies  Allergen Reactions   Other Nausea Only and Other (See Comments)    No spicy foods- Patient cannot tolerate- HEARTBURN    Family History  Problem Relation Age of Onset   Hypertension Mother    Asthma Father    Diabetes Sister    Hypertension Sister    Hypertension Brother    Hypertension Sister    Lupus  Daughter    Social History:  reports that he has been smoking cigarettes. He has a 37.00 pack-year smoking history. He has never used smokeless tobacco. He reports current alcohol use of about 56.0 standard drinks of alcohol per week. He reports that he does not use drugs.  Review of Systems  Genitourinary:  Positive for testicular pain.  All other systems reviewed and are negative.   Physical Exam:  Vital signs in last 24 hours: Temp:  [98.4 F (36.9  C)-99.3 F (37.4 C)] 98.6 F (37 C) (04/26 0225) Pulse Rate:  [100-118] 110 (04/26 0225) Resp:  [17-20] 19 (04/26 0225) BP: (134-164)/(70-96) 159/88 (04/26 0225) SpO2:  [91 %-100 %] 91 % (04/26 0807) Weight:  [67.6 kg-68.9 kg] 67.6 kg (04/25 2315) Physical Exam Vitals reviewed.  Constitutional:      Appearance: Normal appearance.  HENT:     Head: Normocephalic and atraumatic.     Nose: Nose normal. No congestion.     Mouth/Throat:     Mouth: Mucous membranes are dry.  Eyes:     Extraocular Movements: Extraocular movements intact.     Pupils: Pupils are equal, round, and reactive to light.  Cardiovascular:     Rate and Rhythm: Normal rate and regular rhythm.  Pulmonary:     Effort: Pulmonary effort is normal. No respiratory distress.  Abdominal:     General: Abdomen is flat. There is no distension.  Genitourinary:    Penis: Normal.      Testes: Normal.     Comments: 1.5cm incision breakdown with IPP tubing exposed . No cellulitis or induration Musculoskeletal:        General: No swelling. Normal range of motion.     Cervical back: Normal range of motion and neck supple.  Skin:    General: Skin is warm and dry.  Neurological:     General: No focal deficit present.     Mental Status: He is alert and oriented to person, place, and time.  Psychiatric:        Mood and Affect: Mood normal.        Behavior: Behavior normal.     Laboratory Data:  Results for orders placed or performed during the hospital encounter of 12/01/22 (from the past 72 hour(s))  CBG monitoring, ED     Status: Abnormal   Collection Time: 12/01/22  7:43 PM  Result Value Ref Range   Glucose-Capillary 130 (H) 70 - 99 mg/dL    Comment: Glucose reference range applies only to samples taken after fasting for at least 8 hours.  CBC     Status: None   Collection Time: 12/01/22  8:32 PM  Result Value Ref Range   WBC 6.7 4.0 - 10.5 K/uL   RBC 4.66 4.22 - 5.81 MIL/uL   Hemoglobin 14.9 13.0 - 17.0 g/dL    HCT 95.6 21.3 - 08.6 %   MCV 94.6 80.0 - 100.0 fL   MCH 32.0 26.0 - 34.0 pg   MCHC 33.8 30.0 - 36.0 g/dL   RDW 57.8 46.9 - 62.9 %   Platelets 179 150 - 400 K/uL   nRBC 0.0 0.0 - 0.2 %    Comment: Performed at [facility], [address], [place], [place] [address]  Basic metabolic panel     Status: Abnormal   Collection Time: 12/01/22  8:32 PM  Result Value Ref Range   Sodium 134 (L) 135 - 145 mmol/L   Potassium 3.6 3.5 - 5.1 mmol/L   Chloride  100 98 - 111 mmol/L   CO2 25 22 - 32 mmol/L   Glucose, Bld 117 (H) 70 - 99 mg/dL    Comment: Glucose reference range applies only to samples taken after fasting for at least 8 hours.   BUN 7 6 - 20 mg/dL   Creatinine, Ser 1.61 (L) 0.61 - 1.24 mg/dL   Calcium 8.9 8.9 - 09.6 mg/dL   GFR, Estimated >04 >54 mL/min    Comment: (NOTE) Calculated using the CKD-EPI Creatinine Equation (2021)    Anion gap 9 5 - 15    Comment: Performed at [facility], [address], [place], [place] [address]  Basic metabolic panel     Status: Abnormal   Collection Time: 12/02/22  4:31 AM  Result Value Ref Range   Sodium 134 (L) 135 - 145 mmol/L   Potassium 3.3 (L) 3.5 - 5.1 mmol/L   Chloride 102 98 - 111 mmol/L   CO2 24 22 - 32 mmol/L   Glucose, Bld 134 (H) 70 - 99 mg/dL    Comment: Glucose reference range applies only to samples taken after fasting for at least 8 hours.   BUN 10 6 - 20 mg/dL   Creatinine, Ser 9.14 (L) 0.61 - 1.24 mg/dL   Calcium 8.8 (L) 8.9 - 10.3 mg/dL   GFR, Estimated >78 >29 mL/min    Comment: (NOTE) Calculated using the CKD-EPI Creatinine Equation (2021)    Anion gap 8 5 - 15    Comment: Performed at [facility], [address] [place], [place] [address]  CBC     Status: None   Collection Time: 12/02/22  4:31 AM  Result Value Ref Range   WBC 7.0 4.0 - 10.5 K/uL   RBC 4.56 4.22 - 5.81 MIL/uL   Hemoglobin 14.5 13.0 - 17.0 g/dL   HCT 08.6 57.8 - 46.9 %   MCV 95.8 80.0 - 100.0 fL   MCH 31.8 26.0 - 34.0 pg   MCHC 33.2 30.0 - 36.0  g/dL   RDW 62.9 52.8 - 41.3 %   Platelets 175 150 - 400 K/uL   nRBC 0.0 0.0 - 0.2 %    Comment: Performed at [facility], [address], [place], [place] [address]  HIV Antibody (routine testing w rflx)     Status: None   Collection Time: 12/02/22  4:31 AM  Result Value Ref Range   HIV Screen 4th Generation wRfx Non Reactive Non Reactive    Comment: Performed at [facility], [address], [place], [place] [address]   No results found for this or any previous visit (from the past 240 hour(s)). Creatinine: Recent Labs    12/01/22 2032 12/02/22 0431  CREATININE 0.58* 0.49*   Baseline Creatinine: 0.5  Impression/Assessment:  61yo with exposed IPP tubing  Plan:  I discussed the management of infected/colonized IPP including removal versus salvage procedure involving removal of the IPP washout of the wound and replacement of the prosthesis. The patient wishes to pursue IPP replacement. I discussed the case with Dr. Irine Seal at [facility] and he has agreed to proceed with IPP replacement. The patient is currently scheduled for surgery 4/29. Please continue vanc and zosyn until his surgery. Please apply gauze over the scrotal wound  Wilkie Aye 12/02/2022, 1:00 PM

## 2022-12-02 NOTE — Progress Notes (Signed)
Progress Note   Patient: Bryan Fleming ZOX:[id number] DOB: [birth year] DOA: 12/01/2022     1 DOS: the patient was seen and examined on 12/02/2022   Brief hospital admission course:  Reford Olliff  is a 62 y.o. male, past medical history of COPD, lung cancer, GERD, hepatitis, pancreatitis in the past due to alcohol abuse, still drinking alcohol and smoking. -Patient came to ED for wound check, patient had revision of his penile prosthesis by Dr. Ronne Binning on March 7, patient fell down his scrotal area today and felt that [place], and he looked he noticed the wound has opened where he is able to see a tube from his surgery, he denies fever, chills, any foul-smelling odor or discharge, any penile pain, patient came to ED for evaluation - in ED significant lab abnormalities, is afebrile, upon discussion with Dr. Ronne Binning, he recommended admission for revision of his penile prosthesis, but patient will need 3 to 4 days of IV antibiotic administration prior to proceeding with surgery, so he requested throughout hospitalist to admit and to keep on IV vancomycin and Zosyn, and plan for surgery on Monday.  Assessment and Plan: Wound dehiscence at scrotal/penile base area at site of recent penile prosthesis surgery -Dr. Ronne Binning aware on board -Continue to follow urology service recommendations -Continue IV vancomycin and Zosyn -Planning for replacement of penile implant on the scrotal wound revision on 12/05/2022.   -Continue supportive care.   -Patient reports no pain and no dysuria currently.     Alcohol abuse -No active withdrawal symptoms appreciated -Continue CIWA protocol monitoring -Continue thiamine and folic acid.   Tobacco abuse -Cessation counseling provided -Continue nicotine patch.   COPD -No acute exacerbation appreciated -Good saturation on room air -Continue as needed bronchodilator management.   Hypertension -Stable and well-controlled; continue current  antihypertensive regimen. -Heart healthy diet discussed with patient.   GERD -Continue PPI.  HypoKalemia: -Replete electrolytes as needed -Checking magnesium level.  Subjective:  No fever, no chest pain, no nausea, no vomiting.  Expressing no dysuria or significant pain at the moment.  Physical Exam: Vitals:   12/01/22 2315 12/02/22 0225 12/02/22 0807 12/02/22 1400  BP: 134/80 (!) 159/88  139/74  Pulse: (!) 104 (!) 110  (!) 103  Resp: 17 19  17   Temp: 99.3 F (37.4 C) 98.6 F (37 C)  98.9 F (37.2 C)  TempSrc: Oral Oral  Oral  SpO2:  94% 91% 96%  Weight: 67.6 kg     Height: 5\' 9"  (1.753 m)      General exam: Alert, awake, oriented x 3.  In no acute distress. Respiratory system: Clear to auscultation. Respiratory effort normal.  Good saturation on room air. Cardiovascular system:RRR. No murmurs, rubs, gallops. Gastrointestinal system: Abdomen is nondistended, soft and nontender. No organomegaly or masses felt. Normal bowel sounds heard. Central nervous system: Alert and oriented. No focal neurological deficits. Extremities: No cyanosis, clubbing or edema. Skin: Scrotal wound and penile implants tubing exposed.  No induration or cellulitic changes appreciated.  Serosanguineous drainage seen. Psychiatry: Judgement and insight appear normal. Mood & affect appropriate.   Data Reviewed: Basic metabolic panel: Sodium 134, potassium 3.3, chloride 102, bicarb 24, BUN 10, creatinine 0.49 and GFR more than 60. CBC: WBC 7.0, hemoglobin 14.5 and platelet count 175 K.  Family Communication: No family at bedside.  Disposition: Status is: Inpatient Remains inpatient appropriate because: Continue IV antibiotics.   Planned Discharge Destination: Home   Time spent: 35 minutes  Author: Vassie Loll, MD 12/02/2022  7:23 PM  For on call review [email or web address].

## 2022-12-02 NOTE — Discharge Instructions (Signed)
Substance Abuse Resources:   Outpatient Substance Use Treatment Services  Agency Name: [facility] Address: [address] [place], [place] [address] Phone: [phone] / [phone] Website: [email or web address] Services Offered: Support groups for Bipolar, substance abuse, anger management, panic depression and anxiety. Mobile crisis unit, outpatient therapy, substance abuse treatment.   [facility] [address] [place], [place] [address] [phone] Private insurance, Medicare A&B, and [facility]  [facility] (Alcohol and Drug Services) [address] [place], [place] [address] [phone] Medicaid, Self Pay  [facility] [address] [place], [place] [phone] Medicaid and [facility], Self Pay  [facility] [address] [place], [place] [phone] [facility], and Self Pay  [facility] [address] [place], [place] [address] [phone] or [phone] Private Insurance Only  [facility] [address] Ginette Otto, [place] [address] [phone] Medicaid, Medicare, Private Insurance  [facility] Counseling Services at the [facility] [address] [place], [place] [address] [phone] Services are free or reduced  [facility] [address] [phone] Self Pay only, sliding scale  [facility] [address] [place], [place] [address] [phone] ([facility]) Self Pay, Medicaid Only  [facility] [address], [place] [address] [phone] Medicaid, Medicare, Private Insurance  Residential Substance Use Treatment Services  [facility] ([facility] [address] [place], [place] [address] [phone] or [phone] Detox (Medicare, Medicaid, private insurance, and self  pay) Residential Rehab 14 days (Medicare, [place], private insurance, and self pay)  [facility] ([facility]idential [facility]) [address] [place], [place] [phone] Male and Male Detox (Self Pay and Medicaid limited availability) Rehab only Male ([place] and self pay only)  [facility] [address] [place], [place] [address] [phone] or [phone] Detox and Residential Treatment Private Insurance Only   [facility] [address] [place], [place] [address] [phone] Treatment Only, must make assessment appointment, and must be sober for assessment appointment. Self Pay Only, Medicare A&B, [facility], [place] ID only! *Transportation assistance offered from [place] [facility] [employer] [address] [place], [place] [address] Walk in interviews M-Sat 8-4p No pending legal charges [phone]  [facility]: [facility] [address] [place], [place] [phone] (Self Pay, [facility]) [facility] [address] [place], [place] [address] [phone] Detox and Residential Treatment Medicare and Private Insurance  [facility] [address] [place], [place] [address] 28 Day Women's Facility: [phone] 28 Day Men's Facility: [phone] Long-term Residential Program: [phone] Males 25 and Over (No Insurance, upfront fee)  [facility] [address] [place], [place] [address] [phone] Private Insurance with [place], Private Pay [facility] [address] [place], [place] [address] Local [phone] Private Insurance Only  [facility] [address] [place], [place] [address] [phone] (Males, upfront fee)  [facility] [address] [place], [address] [phone] Private Insurance    Penile prosthesis postoperative instructions  Wound:  In most cases your incision will have absorbable sutures that will dissolve within the first 10-20  days. Some will fall out even earlier. Expect some redness as the sutures dissolved but this should occur only around the sutures. If there is generalized redness, especially with increasing pain or swelling, let us know. The scrotum and penis will very likely get "black and blue" as the blood in the tissues spread. Sometimes the  whole scrotum will turn colors. The black and blue is followed by a yellow and brown color. In time, all the discoloration will go away. In some cases some firm swelling in the area of the testicle and pump may persist for up to 4-6 weeks after the surgery and is considered normal in most cases.  Drain:   You may be discharged home with a drain in place. If so, you will be taught how to empty it and should keep track of the output. Additionally, you should call the office to arrange for an appointment to have it removed after a few days.   Diet:  You may return to your normal diet within 24 hours following your surgery. You may note some mild nausea and possibly vomiting the first 6-8 hours following surgery. This is usually due to the side effects of anesthesia, and will disappear quite soon. I would suggest clear liquids and a very light meal the first evening following your surgery.  Activity:  Your physical activity should be restricted the first 48 hours. During that time you should remain relatively inactive, moving about only when necessary. During the first 3 weeks following surgery you should avoid lifting any heavy objects (anything greater than 15 pounds), and avoid strenuous exercise. If you work, ask [place] specifically about your restrictions, both for work and home. We will write a note to your employer if needed.  Avoid using your penis until your follow up visit with Dr Lafonda Mosses, which will typically be around 3-4 weeks following the surgery. Most people are able to start cycling their device after that appointment, and can have intercourse soon thereafter.   You  should plan to wear a tight pair of jockey shorts or an athletic supporter for the first 4-5 days, even to sleep. This will keep the scrotum immobilized to some degree and keep the swelling down.The position of your penis will determine what is most comfortable but I strongly urge you to keep the penis in the "up" position (toward your head). You should continue to tuck "up" your penis when possible for the first 3 months following surgery.  Ice packs should be placed on and off over the scrotum for the first 48 hours. Frozen peas or corn in a ZipLock bag can be frozen, used and re-frozen. Fifteen minutes on and 15 minutes off is a reasonable schedule. The ice is a good pain reliever and keeps the swelling down.  Hygiene:  You may shower 48 hours after your surgery. Tub bathing should be restricted until the wound is completely healed, typically around 2-3 weeks.  Medication:  You will be sent home with some type of pain medication. In many cases you will be sent home with a strong anti-inflammatory medication (Celebrex, Meloxicam) and a narcotic pain pill (hydrocodone or oxycodone). You can also supplement these medications with tylenol (acetaminophen). If the pain medication you are sent home with does not control the pain, please notify the office Problems you should report to [place]:  Fever of 101.0 degrees Fahrenheit or greater. Moderate or severe swelling under the skin incision or involving the scrotum. Drug reaction such as hives, a rash, nausea or vomiting.

## 2022-12-02 NOTE — TOC Initial Note (Signed)
Transition of Care [facility]) - Initial/Assessment Note    Patient Details  Name: Bryan Fleming MRN: [id number] Date of Birth: 1961/02/27  Transition of [facility]) CM/SW Contact:    Annice Needy, LCSW Phone Number: 12/02/2022, 11:30 AM  Clinical Narrative:                 Patient from home. Admitted for wound dehiscence. TOC consulted for SA resources. Discussed with patient who indicated that he did not ask for resources but would accept the. SA resource placed on d/c summary instructions.  TOC signing off.   Expected Discharge Plan: Home/Self Care Barriers to Discharge: No Barriers Identified   Patient Goals and CMS Choice Patient states their goals for this hospitalization and ongoing recovery are:: home          Expected Discharge Plan and Services                                              Prior Living Arrangements/Services     Patient language and need for interpreter reviewed:: Yes        Need for Family Participation in Patient Care: Yes (Comment) Care giver support system in place?: Yes (comment)   Criminal Activity/Legal Involvement Pertinent to Current Situation/Hospitalization: No - Comment as needed  Activities of Daily Living Home Assistive Devices/Equipment: None ADL Screening (condition at time of admission) Patient's cognitive ability adequate to safely complete daily activities?: Yes Is the patient deaf or have difficulty hearing?: No Does the patient have difficulty seeing, even when wearing glasses/contacts?: No Does the patient have difficulty concentrating, remembering, or making decisions?: No Patient able to express need for assistance with ADLs?: Yes Does the patient have difficulty dressing or bathing?: No Independently performs ADLs?: Yes (appropriate for developmental age) Does the patient have difficulty walking or climbing stairs?: No Weakness of Legs: None Weakness of Arms/Hands: None  Permission  Sought/Granted                  Emotional Assessment     Affect (typically observed): Appropriate Orientation: : Oriented to Self, Oriented to Place, Oriented to  Time, Oriented to Situation Alcohol / Substance Use: Not Applicable Psych Involvement: No (comment)  Admission diagnosis:  Wound dehiscence [T81.30XA] Patient Active Problem List   Diagnosis Date Noted   Wound dehiscence 12/01/2022   Malfunction of penile prosthesis (HCC) 10/13/2022   Erectile dysfunction 02/17/2022   Lung mass 04/21/2021   S/P Left Video Assisted Thorascopy with Left Upper Lobectomy, Lypmh Node Dissection, Intercostal Nerve block 04/21/2021   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   Joint pain 02/03/2021   Nausea without vomiting 02/03/2021   Primary lung squamous cell carcinoma, left (HCC) 01/01/2021   Port-A-Cath in place 01/01/2021   Cavitating mass in left upper lung lobe 12/17/2020   Acute pancreatitis 06/04/2020   Leukocytosis 06/04/2020   Hypokalemia 06/04/2020   Elevated liver function tests 02/02/2016   Absolute anemia 02/02/2016   Alcohol abuse 08/30/2015   Anxiety state 08/30/2015   Nicotine dependence, cigarettes, with other nicotine-induced disorders 08/30/2015   Hepatitis 08/31/2011   Hypertension    GERD (gastroesophageal reflux disease)    COPD (chronic obstructive pulmonary disease) (HCC)    PCP:  Benetta Spar, MD Pharmacy:   [id number] - [place], [place] - [address]   [place] [address] Phone: [phone] Fax: [phone]     Social Determinants of Health (SDOH) Social History: SDOH Screenings   Food Insecurity: No Food Insecurity (12/01/2022)  Housing: Low Risk  (12/01/2022)  Transportation Needs: No Transportation Needs (12/01/2022)  Utilities: Not At Risk (12/01/2022)  Depression (PHQ2-9): Low Risk  (12/03/2020)  Physical Activity: Inactive (12/03/2020)  Tobacco Use: High Risk (12/01/2022)    SDOH Interventions:     Readmission Risk Interventions     No data to display

## 2022-12-03 DIAGNOSIS — T8130XA Disruption of wound, unspecified, initial encounter: Secondary | ICD-10-CM | POA: Diagnosis not present

## 2022-12-03 DIAGNOSIS — F17218 Nicotine dependence, cigarettes, with other nicotine-induced disorders: Secondary | ICD-10-CM | POA: Diagnosis not present

## 2022-12-03 DIAGNOSIS — J449 Chronic obstructive pulmonary disease, unspecified: Secondary | ICD-10-CM | POA: Diagnosis not present

## 2022-12-03 DIAGNOSIS — K219 Gastro-esophageal reflux disease without esophagitis: Secondary | ICD-10-CM | POA: Diagnosis not present

## 2022-12-03 LAB — HEMOGLOBIN A1C
Hgb A1c MFr Bld: 5.9 % — ABNORMAL HIGH (ref 4.8–5.6)
Mean Plasma Glucose: 122.63 mg/dL

## 2022-12-03 LAB — BASIC METABOLIC PANEL
Anion gap: 5 (ref 5–15)
BUN: 8 mg/dL (ref 6–20)
CO2: 25 mmol/L (ref 22–32)
Calcium: 8.9 mg/dL (ref 8.9–10.3)
Chloride: 106 mmol/L (ref 98–111)
Creatinine, Ser: 0.62 mg/dL (ref 0.61–1.24)
GFR, Estimated: >60 mL/min (ref >60)
Glucose, Bld: 123 mg/dL — ABNORMAL HIGH (ref 70–99)
Potassium: 4.3 mmol/L (ref 3.5–5.1)
Sodium: 136 mmol/L (ref 135–145)

## 2022-12-03 LAB — MAGNESIUM: Magnesium: 2.4 mg/dL (ref 1.7–2.4)

## 2022-12-03 MED ORDER — NICOTINE 21 MG/24HR TD PT24
21.0000 mg | MEDICATED_PATCH | Freq: Every day | TRANSDERMAL | Status: DC
  Administered 2022-12-03 – 2022-12-06 (×4): 21 mg via TRANSDERMAL
  Filled 2022-12-03 (×4): qty 1

## 2022-12-03 NOTE — Progress Notes (Signed)
Patients wallet returned to patient by security prior to transfer to [facility].

## 2022-12-03 NOTE — Progress Notes (Signed)
I am on-call over the weekend for Urology service.  Spoke with bed management and requested patient be transferred to the [facility] hospital service at some point this weekend so that he is ready for his surgery on Monday morning.

## 2022-12-03 NOTE — Progress Notes (Signed)
Progress Note   Patient: Bryan Fleming ZOX:[id number] DOB: [birth year] DOA: 12/01/2022     2 DOS: the patient was seen and examined on 12/03/2022   Brief hospital admission course:  Bryan Fleming  is a 62 y.o. male, past medical history of COPD, lung cancer, GERD, hepatitis, pancreatitis in the past due to alcohol abuse, still drinking alcohol and smoking. -Patient came to ED for wound check, patient had revision of his penile prosthesis by Bryan Fleming on March 7, patient fell down his scrotal area today and felt that Shoop, and he looked he noticed the wound has opened where he is able to see a tube from his surgery, he denies fever, chills, any foul-smelling odor or discharge, any penile pain, patient came to ED for evaluation - in ED significant lab abnormalities, is afebrile, upon discussion with Bryan Fleming, he recommended admission for revision of his penile prosthesis, but patient will need 3 to 4 days of IV antibiotic administration prior to proceeding with surgery, so he requested throughout hospitalist to admit and to keep on IV vancomycin and Zosyn, and plan for surgery on Monday.  Assessment and Plan: Wound dehiscence at scrotal/penile base area at site of recent penile prosthesis surgery -Bryan Fleming aware on board -Continue to follow urology service recommendations -Continue IV vancomycin and Zosyn -Planning for replacement of penile implant and scrotal wound revision on 12/05/2022 (at [facility]).   -Continue supportive care.   -Patient reports no pain and no dysuria currently.   -Patient will be transferred to [facility] to continue management in anticipation for surgery on Monday.   Alcohol abuse -No active withdrawal symptoms appreciated -Continue CIWA protocol monitoring -Continue thiamine and folic acid.   Tobacco abuse -Cessation counseling provided -Continue nicotine patch.   COPD -No acute exacerbation appreciated -Good saturation  on room air -Continue as needed bronchodilator management.   Hypertension -Stable and well-controlled; continue current antihypertensive regimen. -Heart healthy diet discussed with patient.   GERD -Continue PPI.  HypoKalemia: -Repeat D-dimer within normal limit -Continue to follow electrolytes trend intermittently.  Hyperglycemia/prediabetes -A1c 5.9 -Lifestyle discussed with patient -Close monitoring as an outpatient to determine initiation of hypoglycemic regimen.  Subjective:  Expressing some chills overnight; no nausea, no vomiting, no chest pain, no palpitations, no wound bleeding.  In no major distress.  Physical Exam: Vitals:   12/02/22 1400 12/02/22 2026 12/03/22 0445 12/03/22 0723  BP: 139/74 118/81 112/67   Pulse: (!) 103 95 87   Resp: 17 20 18    Temp: 98.9 F (37.2 C) 99 F (37.2 C) 99.2 F (37.3 C)   TempSrc: Oral Oral Oral   SpO2: 96% 97% 96% 96%  Weight:      Height:       General exam: Alert, awake, oriented x 3; reports having chills overnight; no frank fever.  No chest pain, no nausea, no vomiting.  Reports no significant pain or discomfort in his scrotal area. Respiratory system: Clear to auscultation. Respiratory effort normal.  Good aeration on room air. Cardiovascular system:RRR. No murmurs, rubs, gallops. Gastrointestinal system: Abdomen is nondistended, soft and nontender. No organomegaly or masses felt. Normal bowel sounds heard. Central nervous system: Alert and oriented. No focal neurological deficits. Extremities: No cyanosis or clubbing. Skin: No petechiae; scrotal wound and penile implant tubing exposed; refer to epic media for images of current lesion.  Serosanguineous drainage reported.  No bleeding. Psychiatry: Judgement and insight appear normal. Mood & affect appropriate.   Data Reviewed: Basic metabolic  panel: Sodium 136, potassium 4.3, chloride 106, bicarb 25, BUN 8, creatinine 0.62, GFR more than 60 Magnesium: 2.4 A1c:  5.9  Family Communication: No family at bedside.  Disposition: Status is: Inpatient Remains inpatient appropriate because: Continue IV antibiotics.   Planned Discharge Destination: Home    Time spent: 35 minutes  Author: Vassie Loll, MD 12/03/2022 2:25 PM  For on call review [email or web address].

## 2022-12-04 DIAGNOSIS — I10 Essential (primary) hypertension: Secondary | ICD-10-CM | POA: Diagnosis not present

## 2022-12-04 MED ORDER — NICOTINE 21 MG/24HR TD PT24
21.0000 mg | MEDICATED_PATCH | Freq: Once | TRANSDERMAL | Status: AC
  Administered 2022-12-04: 21 mg via TRANSDERMAL
  Filled 2022-12-04: qty 1

## 2022-12-04 MED ORDER — ORAL CARE MOUTH RINSE: 15.0000 mL | OROMUCOSAL | Status: DC | PRN

## 2022-12-04 MED ORDER — CHLORHEXIDINE GLUCONATE CLOTH 2 % EX PADS
6.0000 | MEDICATED_PAD | Freq: Every day | CUTANEOUS | Status: DC
  Administered 2022-12-04 – 2022-12-05 (×2): 6 via TOPICAL

## 2022-12-04 MED ORDER — FLUCONAZOLE IN SODIUM CHLORIDE 200-0.9 MG/100ML-% IV SOLN
200.0000 mg | INTRAVENOUS | Status: DC
  Administered 2022-12-04: 200 mg via INTRAVENOUS
  Filled 2022-12-04 (×3): qty 100

## 2022-12-04 NOTE — Progress Notes (Signed)
Patient scheduled for penile prosthesis revision tomorrow.  Please hold SQH tomorrow AM.  Please make NPO p MN.  Continue with IV antibiotics.

## 2022-12-04 NOTE — Progress Notes (Signed)
Pharmacy Antibiotic Note  Bryan Fleming is a 62 y.o. male admitted on 12/01/2022 with wound dehiscence in the scrotum at the base of penile shaft on the left side,  s/p surgery for removal of penile prosthesis on 10/13/22.  Pharmacy has been consulted for Zosyn and Vancomycin dosing for a wound infection.  Day #4 of broad abx. SCr up slightly to 0.62.  Plan: Zosyn 3.375 g IV q8h  EI Vancomycin 1000 mg Q12 hrs Monitor clinical picture, renal function, vanc levels prn F/U abx deescalation / LOT  Surgery planned tomorrow. If continuing broad spectrum IV abx after surgery, would recommend vanc, cefepime, flagyl.  Height: 5\' 9"  (175.3 cm) Weight: 67.6 kg (149 lb 0.5 oz) IBW/kg (Calculated) : 70.7  Temp (24hrs), Avg:98.4 F (36.9 C), Min:98 F (36.7 C), Max:99.3 F (37.4 C)  Recent Labs  Lab 12/01/22 2032 12/02/22 0431 12/03/22 0516  WBC 6.7 7.0  --   CREATININE 0.58* 0.49* 0.62    Estimated Creatinine Clearance: 93.9 mL/min (by C-G formula based on SCr of 0.62 mg/dL).    Allergies  Allergen Reactions   Other Nausea Only and Other (See Comments)    No spicy foods- Patient cannot tolerate- HEARTBURN    Antimicrobials this admission: Vancomycin 4/25>> Zosyn 4/25>>  Dose adjustments this admission:   Microbiology results:  None at this time  Thank you for allowing pharmacy to be a part of this patient's care.  Enzo Bi, PharmD, BCPS, BCIDP Clinical Pharmacist 12/04/2022 12:22 PM

## 2022-12-04 NOTE — Progress Notes (Signed)
[facility] PROGRESS NOTE   Bryan Fleming ZOX:[id number] DOB: [birth year] DOA: 12/01/2022  PCP: Benetta Spar, MD  Brief History/Interval Summary: Bryan Fleming  is a 62 y.o. male, past medical history of COPD, lung cancer, GERD, hepatitis, pancreatitis in the past due to alcohol abuse, still drinking alcohol and smoking. Patient came to ED for wound check, patient had revision of his penile prosthesis by Dr. Ronne Binning on March 7, patient fell down his scrotal area and he noticed that the wound had opened where he was able to see a tube from his surgery.  He was hospitalized.  Then transferred to [facility] for definitive management by urology.  Consultants: Urology  Procedures: None yet    Subjective/Interval History: Patient has multiple questions about the procedure that will be done tomorrow.  He was asked to discuss these with his urologist.  Denies any significant pain currently.    Assessment/Plan:  Wound dehiscence at scrotal/penile base area at site of recent penile prosthesis surgery Urology is aware.  Plan is for surgical intervention tomorrow. Patient on broad-spectrum antibiotics including vancomycin and Zosyn which is being continued as per urology recommendations.  Pain is reasonably well-controlled.   Alcohol abuse -No active withdrawal symptoms appreciated -Continue CIWA protocol monitoring -Continue thiamine and folic acid.   Tobacco abuse -Cessation counseling provided -Continue nicotine patch.   COPD -No acute exacerbation appreciated   Essential hypertension Blood pressure is reasonably well-controlled.   GERD Continue PPI.   HypoKalemia: Repleted   Hyperglycemia/prediabetes -A1c 5.9  DVT Prophylaxis: Subcutaneous heparin Code Status: Full code Family Communication: Discussed with patient Disposition Plan: Hopefully return home when cleared by urology  Status is: Inpatient Remains inpatient appropriate  because: Need for surgical intervention      Medications: Scheduled:  acidophilus  2 capsule Oral TID   amLODipine  5 mg Oral Daily   fluticasone furoate-vilanterol  1 puff Inhalation Daily   And   umeclidinium bromide  1 puff Inhalation Daily   folic acid  1 mg Oral Daily   heparin  5,000 Units Subcutaneous Q8H   multivitamin with minerals  1 tablet Oral Daily   nicotine  21 mg Transdermal Daily   thiamine  100 mg Oral Daily   Or   thiamine  100 mg Intravenous Daily   Continuous:  piperacillin-tazobactam (ZOSYN)  IV 3.375 g (12/04/22 8119)   vancomycin 1,000 mg (12/03/22 2156)   JYN:[id number] **OR** acetaminophen, hydrALAZINE, LORazepam **OR** LORazepam, oxyCODONE, traZODone  Antibiotics: Anti-infectives (From admission, onward)    Start     Dose/Rate Route Frequency Ordered Stop   12/02/22 1000  vancomycin (VANCOCIN) IVPB 1000 mg/200 mL premix        1,000 mg 200 mL/hr over 60 Minutes Intravenous Every 12 hours 12/01/22 1955     12/02/22 0400  piperacillin-tazobactam (ZOSYN) IVPB 3.375 g        3.375 g 12.5 mL/hr over 240 Minutes Intravenous Every 8 hours 12/01/22 1942     12/01/22 2015  vancomycin (VANCOREADY) IVPB 1500 mg/300 mL        1,500 mg 150 mL/hr over 120 Minutes Intravenous  Once 12/01/22 1942 12/01/22 2305   12/01/22 1945  piperacillin-tazobactam (ZOSYN) IVPB 3.375 g        3.375 g 100 mL/hr over 30 Minutes Intravenous  Once 12/01/22 1942 12/01/22 2106       Objective:  Vital Signs  Vitals:   12/03/22 2054 12/04/22 0059 12/04/22 0600 12/04/22 0923  BP: (!) 132/90  122/75 117/73 129/86  Pulse: 91 94 93 (!) 103  Resp: 18 18 18 16   Temp: 98.4 F (36.9 C) 98.3 F (36.8 C) 98.1 F (36.7 C) 98.1 F (36.7 C)  TempSrc: Oral Oral Oral Oral  SpO2: 97% 97% 96% 96%  Weight:      Height:        Intake/Output Summary (Last 24 hours) at 12/04/2022 1029 Last data filed at 12/04/2022 0904 Gross per 24 hour  Intake 440 ml  Output --  Net 440 ml    Filed Weights   12/01/22 1606 12/01/22 2315  Weight: 68.9 kg 67.6 kg    General appearance: Awake alert.  In no distress Resp: Clear to auscultation bilaterally.  Normal effort Cardio: S1-S2 is normal regular.  No S3-S4.  No rubs murmurs or bruit GI: Abdomen is soft.  Nontender nondistended.  Bowel sounds are present normal.  No masses organomegaly    Lab Results:  Data Reviewed: I have personally reviewed following labs and reports of the imaging studies  CBC: Recent Labs  Lab 12/01/22 2032 12/02/22 0431  WBC 6.7 7.0  HGB 14.9 14.5  HCT 44.1 43.7  MCV 94.6 95.8  PLT 179 175    Basic Metabolic Panel: Recent Labs  Lab 12/01/22 2032 12/02/22 0431 12/03/22 0516  NA 134* 134* 136  K 3.6 3.3* 4.3  CL 100 102 106  CO2 25 24 25   GLUCOSE 117* 134* 123*  BUN 7 10 8   CREATININE 0.58* 0.49* 0.62  CALCIUM 8.9 8.8* 8.9  MG  --   --  2.4    GFR: Estimated Creatinine Clearance: 93.9 mL/min (by C-G formula based on SCr of 0.62 mg/dL).   HbA1C: Recent Labs    12/03/22 0901  HGBA1C 5.9*    CBG: Recent Labs  Lab 12/01/22 1943  GLUCAP 130*    Radiology Studies: No results found.     LOS: 3 days   Bryan Fleming Bryan Fleming on [email or web address]  12/04/2022, 10:29 AM

## 2022-12-05 ENCOUNTER — Encounter (HOSPITAL_COMMUNITY): Payer: Self-pay | Admitting: Internal Medicine

## 2022-12-05 ENCOUNTER — Other Ambulatory Visit: Payer: Self-pay

## 2022-12-05 ENCOUNTER — Encounter (HOSPITAL_COMMUNITY)
Admission: EM | Disposition: A | Discharge status: Home / Self Care | Payer: Self-pay | Source: Home / Self Care | Attending: Internal Medicine

## 2022-12-05 ENCOUNTER — Inpatient Hospital Stay (HOSPITAL_COMMUNITY): Payer: Medicare HMO | Admitting: Anesthesiology

## 2022-12-05 DIAGNOSIS — J449 Chronic obstructive pulmonary disease, unspecified: Secondary | ICD-10-CM

## 2022-12-05 DIAGNOSIS — T8131XA Disruption of external operation (surgical) wound, not elsewhere classified, initial encounter: Secondary | ICD-10-CM

## 2022-12-05 DIAGNOSIS — I10 Essential (primary) hypertension: Secondary | ICD-10-CM | POA: Diagnosis not present

## 2022-12-05 DIAGNOSIS — F17200 Nicotine dependence, unspecified, uncomplicated: Secondary | ICD-10-CM

## 2022-12-05 DIAGNOSIS — N529 Male erectile dysfunction, unspecified: Secondary | ICD-10-CM

## 2022-12-05 HISTORY — PX: REMOVAL OF PENILE PROSTHESIS: SHX6059

## 2022-12-05 LAB — BASIC METABOLIC PANEL
Anion gap: 10 (ref 5–15)
BUN: 10 mg/dL (ref 6–20)
CO2: 23 mmol/L (ref 22–32)
Calcium: 9.3 mg/dL (ref 8.9–10.3)
Chloride: 101 mmol/L (ref 98–111)
Creatinine, Ser: 0.73 mg/dL (ref 0.61–1.24)
GFR, Estimated: >60 mL/min (ref >60)
Glucose, Bld: 121 mg/dL — ABNORMAL HIGH (ref 70–99)
Potassium: 3.8 mmol/L (ref 3.5–5.1)
Sodium: 134 mmol/L — ABNORMAL LOW (ref 135–145)

## 2022-12-05 LAB — AEROBIC/ANAEROBIC CULTURE W GRAM STAIN (SURGICAL/DEEP WOUND)

## 2022-12-05 LAB — CBC
HCT: 43.1 % (ref 39.0–52.0)
Hemoglobin: 14.3 g/dL (ref 13.0–17.0)
MCH: 32.1 pg (ref 26.0–34.0)
MCHC: 33.2 g/dL (ref 30.0–36.0)
MCV: 96.6 fL (ref 80.0–100.0)
Platelets: 214 10*3/uL (ref 150–400)
RBC: 4.46 MIL/uL (ref 4.22–5.81)
RDW: 14.4 % (ref 11.5–15.5)
WBC: 7.9 10*3/uL (ref 4.0–10.5)
nRBC: 0 % (ref 0.0–0.2)

## 2022-12-05 SURGERY — REMOVAL, PENILE PROSTHESIS
Anesthesia: General | Site: Penis

## 2022-12-05 MED ORDER — LABETALOL HCL 5 MG/ML IV SOLN
INTRAVENOUS | Status: DC | PRN
  Administered 2022-12-05 (×4): 2.5 mg via INTRAVENOUS

## 2022-12-05 MED ORDER — MIDAZOLAM HCL 2 MG/2ML IJ SOLN
INTRAMUSCULAR | Status: AC
  Filled 2022-12-05: qty 2

## 2022-12-05 MED ORDER — OXYCODONE HCL 5 MG/5ML PO SOLN: 5.0000 mg | Freq: Once | ORAL | Status: DC | PRN

## 2022-12-05 MED ORDER — PHENYLEPHRINE HCL (PRESSORS) 10 MG/ML IV SOLN
INTRAVENOUS | Status: DC | PRN
  Administered 2022-12-05: 160 ug via INTRAVENOUS

## 2022-12-05 MED ORDER — ONDANSETRON HCL 4 MG/2ML IJ SOLN
INTRAMUSCULAR | Status: DC | PRN
  Administered 2022-12-05: 4 mg via INTRAVENOUS

## 2022-12-05 MED ORDER — FENTANYL CITRATE (PF) 100 MCG/2ML IJ SOLN
INTRAMUSCULAR | Status: AC
  Filled 2022-12-05: qty 2

## 2022-12-05 MED ORDER — HYDROMORPHONE HCL 2 MG/ML IJ SOLN
INTRAMUSCULAR | Status: AC
  Filled 2022-12-05: qty 1

## 2022-12-05 MED ORDER — VANCOMYCIN HCL 1000 MG IV SOLR
Freq: Once | Status: DC
  Filled 2022-12-05: qty 20

## 2022-12-05 MED ORDER — AMISULPRIDE (ANTIEMETIC) 5 MG/2ML IV SOLN: 10.0000 mg | Freq: Once | INTRAVENOUS | Status: DC | PRN

## 2022-12-05 MED ORDER — ALBUTEROL SULFATE HFA 108 (90 BASE) MCG/ACT IN AERS
INHALATION_SPRAY | RESPIRATORY_TRACT | Status: AC
  Filled 2022-12-05: qty 6.7

## 2022-12-05 MED ORDER — PHENYLEPHRINE HCL-NACL 20-0.9 MG/250ML-% IV SOLN
INTRAVENOUS | Status: DC | PRN
  Administered 2022-12-05: 30 ug/min via INTRAVENOUS

## 2022-12-05 MED ORDER — HYDROMORPHONE HCL 1 MG/ML IJ SOLN
INTRAMUSCULAR | Status: AC
  Filled 2022-12-05: qty 1

## 2022-12-05 MED ORDER — FENTANYL CITRATE PF 50 MCG/ML IJ SOSY
12.5000 ug | PREFILLED_SYRINGE | Freq: Once | INTRAMUSCULAR | Status: AC
  Administered 2022-12-05: 12.5 ug via INTRAVENOUS
  Filled 2022-12-05: qty 1

## 2022-12-05 MED ORDER — HYDRALAZINE HCL 20 MG/ML IJ SOLN
INTRAMUSCULAR | Status: AC
  Filled 2022-12-05: qty 1

## 2022-12-05 MED ORDER — CELECOXIB 200 MG PO CAPS
200.0000 mg | ORAL_CAPSULE | Freq: Two times a day (BID) | ORAL | Status: DC
  Administered 2022-12-05 – 2022-12-06 (×2): 200 mg via ORAL
  Filled 2022-12-05 (×3): qty 1

## 2022-12-05 MED ORDER — PROPOFOL 10 MG/ML IV BOLUS
INTRAVENOUS | Status: AC
  Filled 2022-12-05: qty 20

## 2022-12-05 MED ORDER — DEXAMETHASONE SODIUM PHOSPHATE 10 MG/ML IJ SOLN
INTRAMUSCULAR | Status: DC | PRN
  Administered 2022-12-05: 4 mg via INTRAVENOUS

## 2022-12-05 MED ORDER — OXYCODONE HCL 5 MG PO TABS: 5.0000 mg | ORAL_TABLET | Freq: Once | ORAL | Status: DC | PRN

## 2022-12-05 MED ORDER — LIDOCAINE HCL 1 % IJ SOLN
INTRAMUSCULAR | Status: DC | PRN
  Administered 2022-12-05: 5 mL

## 2022-12-05 MED ORDER — STERILE WATER FOR IRRIGATION IR SOLN
Status: DC | PRN
  Administered 2022-12-05: 500 mL

## 2022-12-05 MED ORDER — IRRISEPT - 450ML BOTTLE WITH 0.05% CHG IN STERILE WATER, USP 99.95% OPTIME
TOPICAL | Status: DC | PRN
  Administered 2022-12-05 (×3): 450 mL via TOPICAL

## 2022-12-05 MED ORDER — SURGIRINSE WOUND IRRIGATION SYSTEM - OPTIME: TOPICAL | Status: DC | PRN

## 2022-12-05 MED ORDER — MIDAZOLAM HCL 5 MG/5ML IJ SOLN
INTRAMUSCULAR | Status: DC | PRN
  Administered 2022-12-05 (×2): 1 mg via INTRAVENOUS

## 2022-12-05 MED ORDER — GENTAMICIN SULFATE 40 MG/ML IJ SOLN
5.0000 mg/kg | INTRAVENOUS | Status: DC
  Filled 2022-12-05: qty 8.5

## 2022-12-05 MED ORDER — FLUCONAZOLE IN SODIUM CHLORIDE 200-0.9 MG/100ML-% IV SOLN
INTRAVENOUS | Status: DC | PRN
  Administered 2022-12-05: 200 mg via INTRAVENOUS

## 2022-12-05 MED ORDER — VANCOMYCIN HCL 1 G IV SOLR: INTRAVENOUS | Status: DC | PRN

## 2022-12-05 MED ORDER — PHENYLEPHRINE HCL (PRESSORS) 10 MG/ML IV SOLN
INTRAVENOUS | Status: AC
  Filled 2022-12-05: qty 1

## 2022-12-05 MED ORDER — BUPIVACAINE HCL (PF) 0.5 % IJ SOLN
INTRAMUSCULAR | Status: AC
  Filled 2022-12-05: qty 30

## 2022-12-05 MED ORDER — DEXAMETHASONE SODIUM PHOSPHATE 10 MG/ML IJ SOLN
INTRAMUSCULAR | Status: AC
  Filled 2022-12-05: qty 1

## 2022-12-05 MED ORDER — HYDRALAZINE HCL 20 MG/ML IJ SOLN
INTRAMUSCULAR | Status: DC | PRN
  Administered 2022-12-05: 2.5 mg via INTRAVENOUS

## 2022-12-05 MED ORDER — ONDANSETRON HCL 4 MG/2ML IJ SOLN: 4.0000 mg | Freq: Once | INTRAMUSCULAR | Status: DC | PRN

## 2022-12-05 MED ORDER — HYDROMORPHONE HCL 1 MG/ML IJ SOLN
0.2500 mg | INTRAMUSCULAR | Status: DC | PRN
  Administered 2022-12-05 (×2): 0.5 mg via INTRAVENOUS

## 2022-12-05 MED ORDER — PROPOFOL 500 MG/50ML IV EMUL
INTRAVENOUS | Status: DC | PRN
  Administered 2022-12-05: 40 ug/kg/min via INTRAVENOUS

## 2022-12-05 MED ORDER — OXYCODONE HCL 5 MG PO TABS
5.0000 mg | ORAL_TABLET | ORAL | Status: DC | PRN
  Administered 2022-12-05 – 2022-12-06 (×3): 5 mg via ORAL
  Filled 2022-12-05 (×3): qty 1

## 2022-12-05 MED ORDER — ROCURONIUM BROMIDE 10 MG/ML (PF) SYRINGE
PREFILLED_SYRINGE | INTRAVENOUS | Status: AC
  Filled 2022-12-05: qty 10

## 2022-12-05 MED ORDER — LACTATED RINGERS IV SOLN: Freq: Once | INTRAVENOUS | Status: AC

## 2022-12-05 MED ORDER — CHLORHEXIDINE GLUCONATE 4 % EX SOLN: Freq: Once | CUTANEOUS | Status: AC

## 2022-12-05 MED ORDER — LIDOCAINE HCL 1 % IJ SOLN
INTRAMUSCULAR | Status: AC
  Filled 2022-12-05: qty 20

## 2022-12-05 MED ORDER — HYDROMORPHONE HCL 1 MG/ML IJ SOLN
0.5000 mg | INTRAMUSCULAR | Status: DC | PRN
  Administered 2022-12-05 – 2022-12-06 (×2): 0.5 mg via INTRAVENOUS
  Filled 2022-12-05 (×2): qty 0.5

## 2022-12-05 MED ORDER — ALBUTEROL SULFATE HFA 108 (90 BASE) MCG/ACT IN AERS
INHALATION_SPRAY | RESPIRATORY_TRACT | Status: DC | PRN
  Administered 2022-12-05: 3 via RESPIRATORY_TRACT

## 2022-12-05 MED ORDER — FLUCONAZOLE IN SODIUM CHLORIDE 200-0.9 MG/100ML-% IV SOLN
200.0000 mg | Freq: Once | INTRAVENOUS | Status: DC
  Filled 2022-12-05: qty 100

## 2022-12-05 MED ORDER — PROPOFOL 10 MG/ML IV BOLUS
INTRAVENOUS | Status: DC | PRN
  Administered 2022-12-05: 20 mg via INTRAVENOUS
  Administered 2022-12-05: 180 mg via INTRAVENOUS
  Administered 2022-12-05 (×2): 10 mg via INTRAVENOUS
  Administered 2022-12-05: 20 mg via INTRAVENOUS

## 2022-12-05 MED ORDER — SODIUM CHLORIDE 0.9 % IR SOLN
Status: DC | PRN
  Administered 2022-12-05: 1000 mL

## 2022-12-05 MED ORDER — PROPOFOL 500 MG/50ML IV EMUL
INTRAVENOUS | Status: AC
  Filled 2022-12-05: qty 50

## 2022-12-05 MED ORDER — VANCOMYCIN HCL IN DEXTROSE 1-5 GM/200ML-% IV SOLN
1000.0000 mg | INTRAVENOUS | Status: AC
  Administered 2022-12-05: 1000 mg via INTRAVENOUS

## 2022-12-05 MED ORDER — LIDOCAINE HCL (CARDIAC) PF 100 MG/5ML IV SOSY
PREFILLED_SYRINGE | INTRAVENOUS | Status: DC | PRN
  Administered 2022-12-05: 60 mg via INTRAVENOUS
  Administered 2022-12-05: 40 mg via INTRAVENOUS

## 2022-12-05 MED ORDER — ONDANSETRON HCL 4 MG/2ML IJ SOLN
INTRAMUSCULAR | Status: AC
  Filled 2022-12-05: qty 2

## 2022-12-05 MED ORDER — BUPIVACAINE HCL 0.5 % IJ SOLN
INTRAMUSCULAR | Status: DC | PRN
  Administered 2022-12-05: 5 mL

## 2022-12-05 MED ORDER — FENTANYL CITRATE (PF) 100 MCG/2ML IJ SOLN
INTRAMUSCULAR | Status: DC | PRN
  Administered 2022-12-05: 25 ug via INTRAVENOUS
  Administered 2022-12-05 (×3): 50 ug via INTRAVENOUS
  Administered 2022-12-05: 25 ug via INTRAVENOUS
  Administered 2022-12-05: 100 ug via INTRAVENOUS
  Administered 2022-12-05: 50 ug via INTRAVENOUS

## 2022-12-05 MED ORDER — SUGAMMADEX SODIUM 200 MG/2ML IV SOLN
INTRAVENOUS | Status: DC | PRN
  Administered 2022-12-05: 200 mg via INTRAVENOUS

## 2022-12-05 MED ORDER — ACETAMINOPHEN 500 MG PO TABS
1000.0000 mg | ORAL_TABLET | Freq: Once | ORAL | Status: AC
  Administered 2022-12-05: 1000 mg via ORAL
  Filled 2022-12-05: qty 2

## 2022-12-05 MED ORDER — ROCURONIUM BROMIDE 100 MG/10ML IV SOLN
INTRAVENOUS | Status: DC | PRN
  Administered 2022-12-05: 50 mg via INTRAVENOUS
  Administered 2022-12-05: 20 mg via INTRAVENOUS

## 2022-12-05 MED ORDER — LIDOCAINE HCL (PF) 2 % IJ SOLN
INTRAMUSCULAR | Status: AC
  Filled 2022-12-05: qty 5

## 2022-12-05 MED ORDER — FENTANYL CITRATE (PF) 250 MCG/5ML IJ SOLN
INTRAMUSCULAR | Status: AC
  Filled 2022-12-05: qty 5

## 2022-12-05 MED ORDER — CHLORHEXIDINE GLUCONATE 0.12 % MT SOLN
15.0000 mL | Freq: Once | OROMUCOSAL | Status: AC
  Administered 2022-12-05: 15 mL via OROMUCOSAL

## 2022-12-05 MED ORDER — LABETALOL HCL 5 MG/ML IV SOLN
INTRAVENOUS | Status: AC
  Filled 2022-12-05: qty 4

## 2022-12-05 MED ORDER — LACTATED RINGERS IV SOLN: INTRAVENOUS | Status: DC | PRN

## 2022-12-05 MED ORDER — HYDROMORPHONE HCL 1 MG/ML IJ SOLN
INTRAMUSCULAR | Status: DC | PRN
  Administered 2022-12-05 (×4): .5 mg via INTRAVENOUS

## 2022-12-05 SURGICAL SUPPLY — 58 items
ADH SKN CLS APL DERMABOND .7 (GAUZE/BANDAGES/DRESSINGS) ×1
APL PRP STRL LF DISP 70% ISPRP (MISCELLANEOUS) ×2
BAG DRN RND TRDRP ANRFLXCHMBR (UROLOGICAL SUPPLIES) ×1
BAG URINE DRAIN 2000ML AR STRL (UROLOGICAL SUPPLIES) ×1 IMPLANT
BLADE SURG 15 STRL LF DISP TIS (BLADE) IMPLANT
BLADE SURG 15 STRL SS (BLADE)
BNDG GAUZE DERMACEA FLUFF 4 (GAUZE/BANDAGES/DRESSINGS) ×1 IMPLANT
BNDG GZE DERMACEA 4 6PLY (GAUZE/BANDAGES/DRESSINGS) ×1
BRIEF MESH DISP LRG (UNDERPADS AND DIAPERS) ×1 IMPLANT
CATH COUDE 5CC RIBBED (CATHETERS) ×1 IMPLANT
CATH RIBBED COUDE 5CC (CATHETERS) ×1
CHLORAPREP W/TINT 26 (MISCELLANEOUS) ×2 IMPLANT
COVER MAYO STAND STRL (DRAPES) ×1 IMPLANT
COVER SURGICAL LIGHT HANDLE (MISCELLANEOUS) ×1 IMPLANT
DERMABOND ADVANCED .7 DNX12 (GAUZE/BANDAGES/DRESSINGS) ×1 IMPLANT
DRAIN CHANNEL 10F 3/8 F FF (DRAIN) IMPLANT
DRAPE INCISE IOBAN 66X45 STRL (DRAPES) ×1 IMPLANT
DRAPE LAPAROTOMY T 98X78 PEDS (DRAPES) ×1 IMPLANT
DRSG TEGADERM 4X4.75 (GAUZE/BANDAGES/DRESSINGS) ×1 IMPLANT
DRSG TEGADERM 6X8 (GAUZE/BANDAGES/DRESSINGS) IMPLANT
ELECT REM PT RETURN 15FT ADLT (MISCELLANEOUS) ×1 IMPLANT
EVACUATOR SILICONE 100CC (DRAIN) ×1 IMPLANT
GAUZE SPONGE 2X2 8PLY STRL LF (GAUZE/BANDAGES/DRESSINGS) IMPLANT
GLOVE BIO SURGEON STRL SZ7 (GLOVE) ×1 IMPLANT
GLOVE BIOGEL PI IND STRL 7.0 (GLOVE) ×1 IMPLANT
GOWN STRL REUS W/ TWL LRG LVL3 (GOWN DISPOSABLE) ×1 IMPLANT
GOWN STRL REUS W/TWL LRG LVL3 (GOWN DISPOSABLE) ×1
HOLDER FOLEY CATH W/STRAP (MISCELLANEOUS) ×1 IMPLANT
IMPL RTE STACKING CX LGX1.5 (Breast) IMPLANT
IMPLANT RTE STACKING CX LGX1.5 (Breast) ×1 IMPLANT
JET LAVAGE IRRISEPT WOUND (IRRIGATION / IRRIGATOR) ×3
KIT ACCESSORY AMS 700 PUMP (Urological Implant) IMPLANT
KIT BASIN OR (CUSTOM PROCEDURE TRAY) ×1 IMPLANT
KIT TURNOVER KIT A (KITS) IMPLANT
LAVAGE JET IRRISEPT WOUND (IRRIGATION / IRRIGATOR) IMPLANT
NDL HYPO 22X1.5 SAFETY MO (MISCELLANEOUS) ×1 IMPLANT
NEEDLE HYPO 22X1.5 SAFETY MO (MISCELLANEOUS) ×1 IMPLANT
NS IRRIG 1000ML POUR BTL (IV SOLUTION) ×1 IMPLANT
PACK GENERAL/GYN (CUSTOM PROCEDURE TRAY) ×1 IMPLANT
PLUG CATH AND CAP STER (CATHETERS) ×1 IMPLANT
PUMP PENILE AMS 700CX MS 12X21 (Erectile Restoration) IMPLANT
RESERVOIR FLAT IZ 100ML (Miscellaneous) IMPLANT
RETRACTOR DEEP SCROTAL PENILE (MISCELLANEOUS) IMPLANT
RETRACTOR WILSON SYSTEM (INSTRUMENTS) IMPLANT
SURGILUBE 2OZ TUBE FLIPTOP (MISCELLANEOUS) IMPLANT
SUT ETHILON 3 0 PS 1 (SUTURE) IMPLANT
SUT MNCRL 0 MO-4 VIOLET 18 CR (SUTURE) IMPLANT
SUT MNCRL AB 4-0 PS2 18 (SUTURE) ×1 IMPLANT
SUT VIC AB 2-0 UR6 27 (SUTURE) ×4 IMPLANT
SUT VIC AB 3-0 SH 27 (SUTURE) ×7
SUT VIC AB 3-0 SH 27X BRD (SUTURE) ×2 IMPLANT
SYR 10ML LL (SYRINGE) ×2 IMPLANT
SYR 20ML LL LF (SYRINGE) IMPLANT
SYR 50ML LL SCALE MARK (SYRINGE) ×3 IMPLANT
SYR BULB IRRIG 60ML STRL (SYRINGE) IMPLANT
SYR CONTROL 10ML LL (SYRINGE) ×1 IMPLANT
TOWEL OR 17X26 10 PK STRL BLUE (TOWEL DISPOSABLE) ×2 IMPLANT
WATER STERILE IRR 500ML POUR (IV SOLUTION) ×1 IMPLANT

## 2022-12-05 NOTE — H&P (Signed)
H&P  History of Present Illness: Bryan Fleming is a 62 y.o. year old M who presents today for IPP removal, washout, and replacement. He recently had an IPP placed and had a wound dehiscence with exposed tubing.   He has no acute complaints today.   Past Medical History:  Diagnosis Date   Cancer [facility])    Lung cancer   COPD (chronic obstructive pulmonary disease) (HCC) 2007   EMPHYSEMA   Depression    Dyspnea    occasional - no oxygen   GERD (gastroesophageal reflux disease) 2010   Hepatitis    Alcoholic    HLD (hyperlipidemia)    no meds   Hypertension    Port-A-Cath in place 01/01/2021    Past Surgical History:  Procedure Laterality Date   BRONCHIAL BIOPSY  12/23/2020   Procedure: BRONCHIAL BIOPSIES;  Surgeon: Leslye Peer, MD;  Location: [facility] ENDOSCOPY;  Service: Pulmonary;;   BRONCHIAL BRUSHINGS  12/23/2020   Procedure: BRONCHIAL BRUSHINGS;  Surgeon: Leslye Peer, MD;  Location: [facility] ENDOSCOPY;  Service: Pulmonary;;   BRONCHIAL NEEDLE ASPIRATION BIOPSY  12/23/2020   Procedure: BRONCHIAL NEEDLE ASPIRATION BIOPSIES;  Surgeon: Leslye Peer, MD;  Location: [facility] ENDOSCOPY;  Service: Pulmonary;;   BRONCHIAL WASHINGS  12/23/2020   Procedure: BRONCHIAL WASHINGS;  Surgeon: Leslye Peer, MD;  Location: [facility] ENDOSCOPY;  Service: Pulmonary;;   CARDIAC CATHETERIZATION  2010   COLONOSCOPY  03/14/2012   Procedure: COLONOSCOPY;  Surgeon: Malissa Hippo, MD;  Location: [facility] ENDO SUITE;  Service: Endoscopy;  Laterality: N/A;  100   INTERCOSTAL NERVE BLOCK  04/21/2021   Procedure: INTERCOSTAL NERVE BLOCK;  Surgeon: Loreli Slot, MD;  Location: [facility] OR;  Service: Thoracic;;   IR IMAGING GUIDED PORT INSERTION  01/12/2021   LUNG REMOVAL, PARTIAL  08/2021   PENILE PROSTHESIS IMPLANT N/A 02/17/2022   Procedure: PENILE PROTHESIS INFLATABLE;  Surgeon: [name], MD;  Location: [facility] ORS;  Service: Urology;  Laterality: N/A;   PORTACATH PLACEMENT  07/2021   REMOVAL OF PENILE  PROSTHESIS N/A 10/13/2022   Procedure: REMOVAL OF PENILE PROSTHESIS;  Surgeon: [name], MD;  Location: [facility] ORS;  Service: Urology;  Laterality: N/A;   VIDEO ASSISTED THORACOSCOPY (VATS)/ LOBECTOMY Left 04/21/2021   Procedure: VIDEO ASSISTED THORACOSCOPY (VATS)/ LOBECTOMY;  Surgeon: Loreli Slot, MD;  Location: [facility] OR;  Service: Thoracic;  Laterality: Left;   VIDEO BRONCHOSCOPY WITH ENDOBRONCHIAL NAVIGATION N/A 12/23/2020   Procedure: VIDEO BRONCHOSCOPY WITH ENDOBRONCHIAL NAVIGATION;  Surgeon: Leslye Peer, MD;  Location: [facility] ENDOSCOPY;  Service: Pulmonary;  Laterality: N/A;   WRIST GANGLION EXCISION  1985    Home Medications:  Current Meds  Medication Sig   acetaminophen (TYLENOL) 500 MG tablet Take 2 tablets (1,000 mg total) by mouth every 6 (six) hours as needed. (Patient taking differently: Take 500 mg by mouth every 6 (six) hours as needed.)   amLODipine (NORVASC) 5 MG tablet Take 5 mg by mouth daily.   Fluticasone-Umeclidin-Vilant (TRELEGY ELLIPTA) 100-62.5-25 MCG/INH AEPB Inhale 1 puff into the lungs daily as needed (shortness of breath).   lidocaine (XYLOCAINE) 2 % solution Use as directed 15 mLs in the mouth or throat as needed for mouth pain.   PROVENTIL HFA 108 (90 Base) MCG/ACT inhaler INHALE 2 PUFFS BY MOUTH EVERY 6 HOURS AS NEEDED FOR COUGHING, WHEEZING, OR SHORTNESS OF BREATH (Patient taking differently: Inhale 2 puffs into the lungs every 6 (six) hours as needed for wheezing or shortness of breath.)  Allergies:  Allergies  Allergen Reactions   Other Nausea Only and Other (See Comments)    No spicy foods- Patient cannot tolerate- HEARTBURN    Family History  Problem Relation Age of Onset   Hypertension Mother    Asthma Father    Diabetes Sister    Hypertension Sister    Hypertension Brother    Hypertension Sister    Lupus Daughter     Social History:  reports that he has been smoking cigarettes. He has a 37.00 pack-year smoking history. He has  never used smokeless tobacco. He reports current alcohol use of about 56.0 standard drinks of alcohol per week. He reports that he does not use drugs.  ROS: A complete review of systems was performed.  All systems are negative except for pertinent findings as noted.  Physical Exam:  Vital signs in last 24 hours: Temp:  [98.2 F (36.8 C)-99.2 F (37.3 C)] 99.2 F (37.3 C) (04/29 1100) Pulse Rate:  [86-94] 94 (04/29 1100) Resp:  [15-20] 16 (04/29 1100) BP: (114-147)/(74-83) 147/83 (04/29 1100) SpO2:  [90 %-97 %] 96 % (04/29 1100) Weight:  [67.6 kg] 67.6 kg (04/29 1115) Constitutional:  Alert and oriented, No acute distress Cardiovascular: Regular rate and rhythm Respiratory: Normal respiratory effort, Lungs clear bilaterally GI: Abdomen is soft, nontender, nondistended, no abdominal masses Lymphatic: No lymphadenopathy Neurologic: Grossly intact, no focal deficits Psychiatric: Normal mood and affect   Laboratory Data:  Recent Labs    12/05/22 0436  WBC 7.9  HGB 14.3  HCT 43.1  PLT 214    Recent Labs    12/03/22 0516 12/05/22 0436  NA 136 134*  K 4.3 3.8  CL 106 101  GLUCOSE 123* 121*  BUN 8 10  CALCIUM 8.9 9.3  CREATININE 0.62 0.73     Results for orders placed or performed during the hospital encounter of 12/01/22 (from the past 24 hour(s))  CBC     Status: None   Collection Time: 12/05/22  4:36 AM  Result Value Ref Range   WBC 7.9 4.0 - 10.5 K/uL   RBC 4.46 4.22 - 5.81 MIL/uL   Hemoglobin 14.3 13.0 - 17.0 g/dL   HCT 16.1 09.6 - 04.5 %   MCV 96.6 80.0 - 100.0 fL   MCH 32.1 26.0 - 34.0 pg   MCHC 33.2 30.0 - 36.0 g/dL   RDW 40.9 81.1 - 91.4 %   Platelets 214 150 - 400 K/uL   nRBC 0.0 0.0 - 0.2 %  Basic metabolic panel     Status: Abnormal   Collection Time: 12/05/22  4:36 AM  Result Value Ref Range   Sodium 134 (L) 135 - 145 mmol/L   Potassium 3.8 3.5 - 5.1 mmol/L   Chloride 101 98 - 111 mmol/L   CO2 23 22 - 32 mmol/L   Glucose, Bld 121 (H) 70 - 99  mg/dL   BUN 10 6 - 20 mg/dL   Creatinine, Ser 7.82 0.61 - 1.24 mg/dL   Calcium 9.3 8.9 - 95.6 mg/dL   GFR, Estimated >21 >30 mL/min   Anion gap 10 5 - 15   No results found for this or any previous visit (from the past 240 hour(s)).  Renal Function: Recent Labs    12/01/22 2032 12/02/22 0431 12/03/22 0516 12/05/22 0436  CREATININE 0.58* 0.49* 0.62 0.73   Estimated Creatinine Clearance: 93.9 mL/min (by C-G formula based on SCr of 0.73 mg/dL).  Radiologic Imaging: No results found.  Assessment:  Murle  MIKAIL GOOSTREE is a 62 y.o. 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 I had a long discussion with Mr Schmelzle regarding his risks today. As he his wound is not grossly infected and he has been on abx for more than 3 days, I do feel it a full salvage procedure with replacement of an inflatable implant is reasonable to try, but we discussed the possibility of failure and implant infection. Conversely we discussed placement of a malleable in this OR setting. At this time, the patient's preference is to replace the Inflatable and he understands the risks. Other risks include bleeding, infection, damage to adjacent structures, device malplacement, device malfunction, penile shortening  Irine Seal, MD 12/05/2022, 12:09 PM  [facility] Pager: [phone]

## 2022-12-05 NOTE — Anesthesia Preprocedure Evaluation (Addendum)
Anesthesia Evaluation  Patient identified by MRN, date of birth, ID band Patient awake    Reviewed: Allergy & Precautions, NPO status , Patient's Chart, lab work & pertinent test results  Airway Mallampati: III  TM Distance: >3 FB Neck ROM: Full    Dental  (+) Dental Advisory Given, Poor Dentition   Pulmonary shortness of breath and with exertion, COPD,  COPD inhaler, Current Smoker and Patient abstained from smoking. 37 pack year history, still smoking 1ppd Lung ca s/p VATS lobectomy 2022 Last used trelegy yesterday Uses albuterol rescue inhaler every few weeks, has not used recently   Pulmonary exam normal breath sounds clear to auscultation       Cardiovascular hypertension (147/83 preop), Pt. on medications and Pt. on home beta blockers Normal cardiovascular exam Rhythm:Regular Rate:Normal     Neuro/Psych  PSYCHIATRIC DISORDERS Anxiety Depression    negative neurological ROS     GI/Hepatic ,GERD  Medicated and Controlled,,(+)     substance abuse (4-5drinks/d)  alcohol use  Endo/Other  negative endocrine ROS    Renal/GU negative Renal ROS  negative genitourinary   Musculoskeletal negative musculoskeletal ROS (+)    Abdominal   Peds  Hematology negative hematology ROS (+) Hb 14.3   Anesthesia Other Findings   Reproductive/Obstetrics Infected penile prosthesis                              Anesthesia Physical Anesthesia Plan  ASA: 3  Anesthesia Plan: General   Post-op Pain Management: Tylenol PO (pre-op)*   Induction: Intravenous  PONV Risk Score and Plan: 1 and Ondansetron, Dexamethasone, Midazolam and Treatment may vary due to age or medical condition  Airway Management Planned: Oral ETT  Additional Equipment: None  Intra-op Plan:   Post-operative Plan: Extubation in OR  Informed Consent: I have reviewed the patients History and Physical, chart, labs and discussed the  procedure including the risks, benefits and alternatives for the proposed anesthesia with the patient or authorized representative who has indicated his/her understanding and acceptance.     Dental advisory given  Plan Discussed with: CRNA  Anesthesia Plan Comments: (Last airway note: Laryngoscope Size: Mac and 4 Grade View: Grade II Tube type: Oral Tube size: 7.5 mm Number of attempts: 1 )        Anesthesia Quick Evaluation

## 2022-12-05 NOTE — Anesthesia Procedure Notes (Signed)
Procedure Name: Intubation Date/Time: 12/05/2022 12:31 PM  Performed by: Garth Bigness, CRNAPre-anesthesia Checklist: Patient identified, Emergency Drugs available, Suction available and Patient being monitored Patient Re-evaluated:Patient Re-evaluated prior to induction Oxygen Delivery Method: Circle system utilized Preoxygenation: Pre-oxygenation with 100% oxygen Induction Type: IV induction Ventilation: Mask ventilation without difficulty Laryngoscope Size: Mac and 4 Grade View: Grade II Tube type: Oral Tube size: 7.5 mm Number of attempts: 1 Airway Equipment and Method: Stylet Placement Confirmation: ETT inserted through vocal cords under direct vision, positive ETCO2 and breath sounds checked- equal and bilateral Secured at: 23 cm Tube secured with: Tape Dental Injury: Teeth and Oropharynx as per pre-operative assessment

## 2022-12-05 NOTE — Anesthesia Postprocedure Evaluation (Signed)
Anesthesia Post Note  Patient: DAXTYN ROTTENBERG  Procedure(s) Performed: REMOVAL AND REPLACEMENT OF PENILE PROSTHESIS (Penis)     Patient location during evaluation: PACU Anesthesia Type: General Level of consciousness: awake and alert Pain management: pain level controlled Vital Signs Assessment: post-procedure vital signs reviewed and stable Respiratory status: spontaneous breathing, nonlabored ventilation, respiratory function stable and patient connected to nasal cannula oxygen Cardiovascular status: blood pressure returned to baseline and stable Postop Assessment: no apparent nausea or vomiting Anesthetic complications: no  No notable events documented.  Last Vitals:  Vitals:   12/05/22 1630 12/05/22 1650  BP: 139/75 131/79  Pulse: 95 94  Resp: 12 18  Temp: 37.2 C 37.1 C  SpO2: 93% 95%    Last Pain:  Vitals:   12/05/22 1650  TempSrc: Oral  PainSc:                  Kennieth Rad

## 2022-12-05 NOTE — Op Note (Signed)
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 Patient admitted and started on IV abx Friday in preparation for operation today  ANESTHESIA:  General  EBL:  Minimal  Device: 3 piece AMS CX 700: 100 cc reservoir, 21 cm cylinders and 1.5 cm rear-tip extenders on right and left sides  LOCAL MEDICATIONS USED:  None  SPECIMEN: old implant removed and discarded  Description of procedure: The patient was taken to the major operating room, placed on the table and administered general anesthesia in the supine position. His genitalia was then prepped with chlorhexidine x 2. He was draped in the usual sterile fashion, and I used [place] on the field. An official timeout was then performed.  On exam, the pump and reservoir tubing loops were extruded out of the wound  A 14 [place] coude catheter was then placed in the bladder and the bladder was drained and the catheter was plugged. We opened the existing dehiscence with the bovie. The tissue was very stuck around this area. The lonestar retractor was positioned so as to have excellent exposure. The pump was carefully dissected free and delivered. We then traced the cylinder tubing down to the corpora, which was opened, and the cylinders were removed. The reservoir tubing was dissected free towards the left space of retzius, and we were able to drain and deliver the reservoir.   It was noted that no pus or purulent material was present. I did remove some of the pseudocapsule from the pump at this time. Given the appearance of the wound, I felt it safe to attempt a salvage procedure with an inflatable implant, which was the patient's preference.  The wound,  including the corpora, pump site, and reservoir site, was then vigorously irrigated with vang/gent and irrisept.  2-0 Vicryl sutures were then placed each corpus cavernosum to serve as stay sutures. I then irrigated the corpus cavernosum with antibiotic solution and measured the distance proximally and distally from the stay suture on the left and was found to be 11.5 and 11 cm, respectively.I then turned my attention to the contralateral corpus cavernosum and placed my stay sutures, made my corporotomy and dilated the corpus cavernosum in an identical fashion. This was measured and also was found to be 13.5 cm proximally and 9.5 distally. It was irrigated with anastomotic solution as was the scrotum. I then chose a 21 cm cylinder set with 1.5 cm rear-tip extenders and these were prepped while I prepared the site for reservoir placement.  I then digitally probed into the right external inguinal ring. My finger was used to poke through the posterior wall of the ring. I used my finger to ensure I was in the appropriate space, and to clear room for the reservoir. I irrigated the space with anastomotic solution and then placed the reservoir in this location. I then filled the reservoir with 99 cc of sterile saline, and checked to confirm proper position. There was minimal backpressure with the reservoir max-filled.  Attention was redirected to the corporotomies where the cylinders were then placed by first fixing the suture to the distal aspect of the right cylinder to a straight needle. This was then loaded on the [facility] inserter and passed through the corporotomy and distally. I  then advanced the straight needle with the [facility] inserter out through the glans and this was grasped with a hemostat and pulled through the glans and the suture was secured with a hemostat. I then performed an identical maneuver on the contralateral side. After this was performed I irrigated both corpus cavernosum; there was no evidence  of urethral perforation. I inserted the distal portion of the cylinder through the corporotomies and pulled this to the end of the corpora with the suture. The proximal aspect with the rear-tip extender was then passed through the corporotomy and into the seated position on each side. I then connected reservoir tubing to a syringe filled with sterile saline and inflated the device. I noted a good straight erection with both cylinders equidistant under the glans and no buckling of the cylinders. I therefore deflated the device and closed the corporotomies with used my previously placed stay sutures.   I then grasped the scrotal skin in the midline with a babcock, and used a hemostat to dissect down to the dependent-most portion of the scrotum, creating a new, deeper pouch for the pump. The nasal speculum was inserted into this space, and facilitated placement of the pump. The cylinder was then connected to the pump after excising the excess tubing with appropriate shodded hemostats in place and then I used the supplied connectors to make the connection. I then again cycled the device with the pump and it cycled properly.  I irrigated the wound one last time with antibiotic irrigation. Given the patient's prior procedure and scarring, the incision had torn more open with the retractor. Additionally, it was challenging to do a standard multi-layer closure. Ultimately, I was able to close 2 layers with 3-0 vicryl before closing skin.  I placed a 10 Fr blake drain over the corporotomies before closing.  Incision dressed with dermabond.  A mummy wrap was applied. The catheter was connected to closed system drainage, and drain connected to suction bulb and the patient was awakened and taken recovery room in stable and satisfactory condition. He tolerated the procedure well and there were no intraoperative complications. Needle sponge and instrument counts were correct at the end of the operation.  Will see patient in  morning. Likely home with drain in place. Would recommend waiting to activate implant 6 weeks

## 2022-12-05 NOTE — Progress Notes (Signed)
[facility] PROGRESS NOTE   [name] ZOX:[id number] DOB: 28-Jul-1961 DOA: 12/01/2022  PCP: Benetta Spar, MD  Brief History/Interval Summary: Bryan Fleming  is a 62 y.o. male, past medical history of COPD, lung cancer, GERD, hepatitis, pancreatitis in the past due to alcohol abuse, still drinking alcohol and smoking. Patient came to ED for wound check, patient had revision of his penile prosthesis by Dr. Ronne Binning on March 7, patient fell down his scrotal area and he noticed that the wound had opened where he was able to see a tube from his surgery.  He was hospitalized.  Then transferred to [facility] for definitive management by urology.  Consultants: Urology  Procedures: None yet    Subjective/Interval History: Patient denies any complaints this morning.  His wife is at the bedside.  Waiting on his surgery this morning.   Assessment/Plan:  Wound dehiscence at scrotal/penile base area at site of recent penile prosthesis surgery Urology is aware.  Plan is for surgical intervention today. Patient on broad-spectrum antibiotics including vancomycin and Zosyn which is being continued as per urology recommendations.  Pain is reasonably well-controlled.   Alcohol abuse -No active withdrawal symptoms appreciated -Continue CIWA protocol monitoring -Continue thiamine and folic acid.   Tobacco abuse -Cessation counseling provided -Continue nicotine patch.   COPD Stable.   Essential hypertension Blood pressure is reasonably well-controlled.  Noted to be on amlodipine   GERD Continue PPI.   HypoKalemia: Repleted   Hyperglycemia/prediabetes -A1c 5.9  DVT Prophylaxis: Subcutaneous heparin Code Status: Full code Family Communication: Discussed with patient and his wife Disposition Plan: Hopefully return home when cleared by urology  Status is: Inpatient Remains inpatient appropriate because: Need for surgical  intervention      Medications: Scheduled:  acidophilus  2 capsule Oral TID   amLODipine  5 mg Oral Daily   chlorhexidine  15 mL Mouth/Throat Once   Chlorhexidine Gluconate Cloth  6 each Topical Daily   fluticasone furoate-vilanterol  1 puff Inhalation Daily   And   umeclidinium bromide  1 puff Inhalation Daily   folic acid  1 mg Oral Daily   multivitamin with minerals  1 tablet Oral Daily   nicotine  21 mg Transdermal Daily   nicotine  21 mg Transdermal Once   thiamine  100 mg Oral Daily   Or   thiamine  100 mg Intravenous Daily   Continuous:  fluconazole (DIFLUCAN) IV Stopped (12/04/22 1457)   lactated ringers     piperacillin-tazobactam (ZOSYN)  IV 3.375 g (12/05/22 0541)   vancomycin 1,000 mg (12/04/22 2149)   WJX:[id number] **OR** acetaminophen, hydrALAZINE, mouth rinse, oxyCODONE, traZODone  Antibiotics: Anti-infectives (From admission, onward)    Start     Dose/Rate Route Frequency Ordered Stop   12/04/22 1400  fluconazole (DIFLUCAN) IVPB 200 mg        200 mg 100 mL/hr over 60 Minutes Intravenous Every 24 hours 12/04/22 1322     12/02/22 1000  vancomycin (VANCOCIN) IVPB 1000 mg/200 mL premix        1,000 mg 200 mL/hr over 60 Minutes Intravenous Every 12 hours 12/01/22 1955     12/02/22 0400  piperacillin-tazobactam (ZOSYN) IVPB 3.375 g        3.375 g 12.5 mL/hr over 240 Minutes Intravenous Every 8 hours 12/01/22 1942     12/01/22 2015  vancomycin (VANCOREADY) IVPB 1500 mg/300 mL        1,500 mg 150 mL/hr over 120 Minutes Intravenous  Once 12/01/22  1942 12/01/22 2305   12/01/22 1945  piperacillin-tazobactam (ZOSYN) IVPB 3.375 g        3.375 g 100 mL/hr over 30 Minutes Intravenous  Once 12/01/22 1942 12/01/22 2106       Objective:  Vital Signs  Vitals:   12/04/22 0923 12/04/22 1307 12/04/22 2002 12/05/22 0544  BP: 129/86 124/80 114/75 133/74  Pulse: (!) 103 86 93 94  Resp: 16 15 20 16   Temp: 98.1 F (36.7 C) 98.2 F (36.8 C) 98.5 F (36.9 C)  98.4 F (36.9 C)  TempSrc: Oral  Oral Oral  SpO2: 96% 97% 95% 90%  Weight:      Height:        Intake/Output Summary (Last 24 hours) at 12/05/2022 0915 Last data filed at 12/05/2022 0700 Gross per 24 hour  Intake 1135.02 ml  Output 675 ml  Net 460.02 ml    Filed Weights   12/01/22 1606 12/01/22 2315  Weight: 68.9 kg 67.6 kg    General appearance: Awake alert.  In no distress Resp: Clear to auscultation bilaterally.  Normal effort Cardio: S1-S2 is normal regular.  No S3-S4.  No rubs murmurs or bruit GI: Abdomen is soft.  Nontender nondistended.  Bowel sounds are present normal.  No masses organomegaly     Lab Results:  Data Reviewed: I have personally reviewed following labs and reports of the imaging studies  CBC: Recent Labs  Lab 12/01/22 2032 12/02/22 0431 12/05/22 0436  WBC 6.7 7.0 7.9  HGB 14.9 14.5 14.3  HCT 44.1 43.7 43.1  MCV 94.6 95.8 96.6  PLT 179 175 214     Basic Metabolic Panel: Recent Labs  Lab 12/01/22 2032 12/02/22 0431 12/03/22 0516 12/05/22 0436  NA 134* 134* 136 134*  K 3.6 3.3* 4.3 3.8  CL 100 102 106 101  CO2 25 24 25 23   GLUCOSE 117* 134* 123* 121*  BUN 7 10 8 10   CREATININE 0.58* 0.49* 0.62 0.73  CALCIUM 8.9 8.8* 8.9 9.3  MG  --   --  2.4  --      GFR: Estimated Creatinine Clearance: 93.9 mL/min (by C-G formula based on SCr of 0.73 mg/dL).   HbA1C: Recent Labs    12/03/22 0901  HGBA1C 5.9*     CBG: Recent Labs  Lab 12/01/22 1943  GLUCAP 130*     Radiology Studies: No results found.     LOS: 4 days   Senaya [employer] on [email or web address]  12/05/2022, 9:15 AM

## 2022-12-05 NOTE — Transfer of Care (Signed)
Immediate Anesthesia Transfer of Care Note  Patient: Bryan Fleming  Procedure(s) Performed: REMOVAL AND REPLACEMENT OF PENILE PROSTHESIS (Penis)  Patient Location: PACU  Anesthesia Type:General  Level of Consciousness: sedated  Airway & Oxygen Therapy: Patient Spontanous Breathing and Patient connected to face mask oxygen  Post-op Assessment: Report given to RN and Post -op Vital signs reviewed and stable  Post vital signs: Reviewed and stable  Last Vitals:  Vitals Value Taken Time  BP 174/87 12/05/22 1543  Temp    Pulse 106 12/05/22 1545  Resp 18 12/05/22 1545  SpO2 99 % 12/05/22 1545  Vitals shown include unvalidated device data.  Last Pain:  Vitals:   12/05/22 1115  TempSrc:   PainSc: 0-No pain      Patients Stated Pain Goal: 1 (12/04/22 2002)  Complications: No notable events documented.

## 2022-12-06 ENCOUNTER — Encounter (HOSPITAL_COMMUNITY): Payer: Self-pay | Admitting: Urology

## 2022-12-06 DIAGNOSIS — Y732 Prosthetic and other implants, materials and accessory gastroenterology and urology devices associated with adverse incidents: Secondary | ICD-10-CM | POA: Diagnosis not present

## 2022-12-06 DIAGNOSIS — Z23 Encounter for immunization: Secondary | ICD-10-CM | POA: Diagnosis not present

## 2022-12-06 DIAGNOSIS — T8130XA Disruption of wound, unspecified, initial encounter: Secondary | ICD-10-CM | POA: Diagnosis not present

## 2022-12-06 LAB — CBC
HCT: 40.4 % (ref 39.0–52.0)
Hemoglobin: 13 g/dL (ref 13.0–17.0)
MCH: 31.7 pg (ref 26.0–34.0)
MCHC: 32.2 g/dL (ref 30.0–36.0)
MCV: 98.5 fL (ref 80.0–100.0)
Platelets: 212 10*3/uL (ref 150–400)
RBC: 4.1 MIL/uL — ABNORMAL LOW (ref 4.22–5.81)
RDW: 14.4 % (ref 11.5–15.5)
WBC: 10.3 10*3/uL (ref 4.0–10.5)
nRBC: 0 % (ref 0.0–0.2)

## 2022-12-06 LAB — BASIC METABOLIC PANEL
Anion gap: 10 (ref 5–15)
BUN: 13 mg/dL (ref 6–20)
CO2: 23 mmol/L (ref 22–32)
Calcium: 9 mg/dL (ref 8.9–10.3)
Chloride: 103 mmol/L (ref 98–111)
Creatinine, Ser: 0.93 mg/dL (ref 0.61–1.24)
GFR, Estimated: >60 mL/min (ref >60)
Glucose, Bld: 165 mg/dL — ABNORMAL HIGH (ref 70–99)
Potassium: 3.5 mmol/L (ref 3.5–5.1)
Sodium: 136 mmol/L (ref 135–145)

## 2022-12-06 LAB — AEROBIC/ANAEROBIC CULTURE W GRAM STAIN (SURGICAL/DEEP WOUND)

## 2022-12-06 LAB — CREATININE, SERUM
Creatinine, Ser: 0.93 mg/dL (ref 0.61–1.24)
GFR, Estimated: >60 mL/min (ref >60)

## 2022-12-06 MED ORDER — OXYCODONE HCL 5 MG PO TABS: 5.0000 mg | ORAL_TABLET | ORAL | 0 refills | Status: DC | PRN

## 2022-12-06 MED ORDER — SODIUM CHLORIDE 0.9 % IV SOLN: INTRAVENOUS | Status: DC

## 2022-12-06 MED ORDER — CELECOXIB 200 MG PO CAPS: 200.0000 mg | ORAL_CAPSULE | Freq: Two times a day (BID) | ORAL | 1 refills | Status: DC

## 2022-12-06 MED ORDER — ACETAMINOPHEN 500 MG PO TABS: 1000.0000 mg | ORAL_TABLET | Freq: Four times a day (QID) | ORAL | 0 refills | Status: AC

## 2022-12-06 MED ORDER — POTASSIUM CHLORIDE CRYS ER 20 MEQ PO TBCR
40.0000 meq | EXTENDED_RELEASE_TABLET | Freq: Once | ORAL | Status: AC
  Administered 2022-12-06: 40 meq via ORAL
  Filled 2022-12-06: qty 2

## 2022-12-06 MED ORDER — ACETAMINOPHEN 500 MG PO TABS
1000.0000 mg | ORAL_TABLET | Freq: Four times a day (QID) | ORAL | Status: DC
  Administered 2022-12-06: 1000 mg via ORAL
  Filled 2022-12-06: qty 2

## 2022-12-06 MED ORDER — AMOXICILLIN-POT CLAVULANATE 875-125 MG PO TABS: 1.0000 | ORAL_TABLET | Freq: Two times a day (BID) | ORAL | 0 refills | Status: DC

## 2022-12-06 NOTE — Plan of Care (Signed)

## 2022-12-06 NOTE — Discharge Summary (Signed)
[facility]  Physician Discharge Summary   Patient ID: Bryan Fleming MRN: [id number] DOB/AGE: [birth year] 62 y.o.  Admit date: 12/01/2022 Discharge date:   12/06/2022   PCP: Benetta Spar, MD  DISCHARGE DIAGNOSES:  Penile Wound dehiscence   Hypertension   GERD (gastroesophageal reflux disease)   COPD (chronic obstructive pulmonary disease) (HCC)   Alcohol abuse   Nicotine dependence, cigarettes, with other nicotine-induced disorders   RECOMMENDATIONS FOR OUTPATIENT FOLLOW UP: Outpatient follow-up with urology as recommended by them   Home Health: None Equipment/Devices: None  CODE STATUS: Full code  DISCHARGE CONDITION: fair  Diet recommendation: As before  INITIAL HISTORY: Bryan Fleming  is a 62 y.o. male, past medical history of COPD, lung cancer, GERD, hepatitis, pancreatitis in the past due to alcohol abuse, still drinking alcohol and smoking. Patient came to ED for wound check, patient had revision of his penile prosthesis by Dr. Ronne Binning on March 7, patient fell down his scrotal area and he noticed that the wound had opened where he was able to see a tube from his surgery.  He was hospitalized.  Then transferred to [facility] for definitive management by urology.   Consultants: Urology   Procedures: 3 piece inflatable penile prosthesis (BS/AMS)    HOSPITAL COURSE:   Wound dehiscence at scrotal/penile base area at site of recent penile prosthesis surgery Patient seen by urology and underwent surgery.  Cleared by urology for discharge today.  They have written prescriptions for pain medications and antibiotics.     Alcohol abuse No active withdrawal symptoms appreciated   Tobacco abuse Cessation counseling provided   COPD Stable.   Essential hypertension Blood pressure is reasonably well-controlled.  Noted to be on amlodipine   GERD Continue PPI.   HypoKalemia: Repleted   Hyperglycemia/prediabetes -A1c  5.9  Patient is stable.  Okay for discharge home today after patient has voided.  Potassium will be supplemented prior to discharge.   PERTINENT LABS:  The results of significant diagnostics from this hospitalization (including imaging, microbiology, ancillary and laboratory) are listed below for reference.    Microbiology: Recent Results (from the past 240 hour(s))  Aerobic/Anaerobic Culture w Gram Stain (surgical/deep wound)     Status: None (Preliminary result)   Collection Time: 12/05/22  1:28 PM   Specimen: Wound  Result Value Ref Range Status   Specimen Description   Final    WOUND Performed at [facility], [address], [place], [place] [address]    Special Requests SCROTAL  Final   Gram Stain   Final    RARE SQUAMOUS EPITHELIAL CELLS PRESENT RARE WBC PRESENT, PREDOMINANTLY PMN NO ORGANISMS SEEN    Culture   Final    NO GROWTH < 24 HOURS Performed at [facility], [address], [place], [place] [address]    Report Status PENDING  Incomplete     Labs:   Basic Metabolic Panel: Recent Labs  Lab 12/01/22 2032 12/02/22 0431 12/03/22 0516 12/05/22 0436 12/06/22 0441 12/06/22 0700  NA 134* 134* 136 134*  --  136  K 3.6 3.3* 4.3 3.8  --  3.5  CL 100 102 106 101  --  103  CO2 25 24 25 23   --  23  GLUCOSE 117* 134* 123* 121*  --  165*  BUN 7 10 8 10   --  13  CREATININE 0.58* 0.49* 0.62 0.73 0.93 0.93  CALCIUM 8.9 8.8* 8.9 9.3  --  9.0  MG  --   --  2.4  --   --   --     CBC: Recent Labs  Lab 12/01/22 2032 12/02/22 0431 12/05/22 0436 12/06/22 0700  WBC 6.7 7.0 7.9 10.3  HGB 14.9 14.5 14.3 13.0  HCT 44.1 43.7 43.1 40.4  MCV 94.6 95.8 96.6 98.5  PLT 179 175 214 212     CBG: Recent Labs  Lab 12/01/22 1943  GLUCAP 130*     IMAGING STUDIES No results found.  DISCHARGE EXAMINATION: Vitals:   12/05/22 2106 12/06/22 0059 12/06/22 0500 12/06/22 0756  BP: 134/82 127/83 125/87   Pulse: (!) 108 (!) 101 87   Resp: 16  20 18    Temp: 98.5 F (36.9 C) 98.4 F (36.9 C) 98.3 F (36.8 C)   TempSrc: Oral Oral Oral   SpO2: 94% 92% 95% 96%  Weight:      Height:       General appearance: Awake alert.  In no distress Resp: Clear to auscultation bilaterally.  Normal effort Cardio: S1-S2 is normal regular.  No S3-S4.  No rubs murmurs or bruit GI: Abdomen is soft.  Nontender nondistended.  Bowel sounds are present normal.  No masses organomegaly   DISPOSITION: Home  Discharge Instructions     Call MD for:  difficulty breathing, headache or visual disturbances   Complete by: As directed    Call MD for:  extreme fatigue   Complete by: As directed    Call MD for:  persistant dizziness or light-headedness   Complete by: As directed    Call MD for:  persistant nausea and vomiting   Complete by: As directed    Call MD for:  severe uncontrolled pain   Complete by: As directed    Call MD for:  temperature >100.4   Complete by: As directed    Diet general   Complete by: As directed    Discharge instructions   Complete by: As directed    Please follow instructions provided by the urologist.  You were cared for by a hospitalist during your hospital stay. If you have any questions about your discharge medications or the care you received while you were in the hospital after you are discharged, you can call the unit and asked to speak with the hospitalist on call if the hospitalist that took care of you is not available. Once you are discharged, your primary care physician will handle any further medical issues. Please note that NO REFILLS for any discharge medications will be authorized once you are discharged, as it is imperative that you return to your primary care physician (or establish a relationship with a primary care physician if you do not have one) for your aftercare needs so that they can reassess your need for medications and monitor your lab values. If you do not have a primary care physician, you can  call [phone] for a physician referral.   Increase activity slowly   Complete by: As directed         Allergies as of 12/06/2022       Reactions   Other Nausea Only, Other (See Comments)   No spicy foods- Patient cannot tolerate- HEARTBURN        Medication List     STOP taking these medications    DULoxetine 30 MG capsule Commonly known as: CYMBALTA   gabapentin 300 MG capsule Commonly known as: NEURONTIN   sulfamethoxazole-trimethoprim 800-160 MG tablet Commonly known as: BACTRIM DS       TAKE these medications  acetaminophen 500 MG tablet Commonly known as: TYLENOL Take 2 tablets (1,000 mg total) by mouth every 6 (six) hours. What changed:  when to take this reasons to take this   amLODipine 5 MG tablet Commonly known as: NORVASC Take 5 mg by mouth daily.   amoxicillin-clavulanate 875-125 MG tablet Commonly known as: AUGMENTIN Take 1 tablet by mouth 2 (two) times daily.   celecoxib 200 MG capsule Commonly known as: CELEBREX Take 1 capsule (200 mg total) by mouth 2 (two) times daily.   lidocaine 2 % solution Commonly known as: XYLOCAINE Use as directed 15 mLs in the mouth or throat as needed for mouth pain.   metoprolol tartrate 50 MG tablet Commonly known as: LOPRESSOR Take 50 mg by mouth daily.   oxyCODONE 5 MG immediate release tablet Commonly known as: Oxy IR/ROXICODONE Take 1 tablet (5 mg total) by mouth every 4 (four) hours as needed for moderate pain.   Proventil HFA 108 (90 Base) MCG/ACT inhaler Generic drug: albuterol INHALE 2 PUFFS BY MOUTH EVERY 6 HOURS AS NEEDED FOR COUGHING, WHEEZING, OR SHORTNESS OF BREATH What changed: See the new instructions.   Trelegy Ellipta 100-62.5-25 MCG/ACT Aepb Generic drug: Fluticasone-Umeclidin-Vilant Inhale 1 puff into the lungs daily as needed (shortness of breath).          Follow-up Information     [name], MD. Call.   Specialty: Urology Why: drain out later this week, f/u  with me 2-3 weeks for wound check Contact information: [address] [place] [address] [phone]                 TOTAL DISCHARGE TIME: 35 mins  Davey Bergsma Rito Ehrlich  [facility] Pager on [email or web address]  12/06/2022, 10:45 AM

## 2022-12-06 NOTE — Care Management Important Message (Signed)
Important Message  Patient Details IM Letter given to the Patient. Name: Bryan Fleming MRN: [id number] Date of Birth: 1960-09-12   Medicare Important Message Given:  Yes     Caren Macadam 12/06/2022, 9:48 AM

## 2022-12-07 LAB — AEROBIC/ANAEROBIC CULTURE W GRAM STAIN (SURGICAL/DEEP WOUND)

## 2022-12-08 LAB — AEROBIC/ANAEROBIC CULTURE W GRAM STAIN (SURGICAL/DEEP WOUND)

## 2022-12-11 DIAGNOSIS — I10 Essential (primary) hypertension: Secondary | ICD-10-CM | POA: Diagnosis not present

## 2022-12-11 DIAGNOSIS — K219 Gastro-esophageal reflux disease without esophagitis: Secondary | ICD-10-CM | POA: Diagnosis not present

## 2022-12-14 NOTE — Progress Notes (Signed)
CARDIOLOGY CONSULT NOTE       Patient ID: Bryan Fleming MRN: [id number] DOB/AGE: 1961-05-30 62 y.o.  Admit date: (Not on file) Referring Physician: [name] Primary Physician: Benetta Spar, MD Primary Cardiologist: Eden Emms Reason for Consultation: Cardiomegaly  Active Problems:   * No active hospital problems. *   HPI:  62 y.o. 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 with hospitalization 4/25-30/24 wound dehiscence Original surgery 10/13/22 D/C on Bactrim   With cancer, smoking, ETOH and infection HR runs high ECG today ST rate 105 Discussed increasing Toprol to 100 mg daily He indicates being compliant   ROS All other systems reviewed and negative except as noted above  Past Medical History:  Diagnosis Date   Cancer (HCC)    Lung cancer   COPD (chronic obstructive pulmonary disease) (HCC) 2007   EMPHYSEMA   Depression    Dyspnea    occasional - no oxygen   GERD (gastroesophageal reflux disease) 2010   Hepatitis    Alcoholic    HLD (hyperlipidemia)    no meds   Hypertension    Port-A-Cath in place 01/01/2021    Family History  Problem Relation Age of Onset   Hypertension Mother    Asthma Father    Diabetes Sister    Hypertension Sister    Hypertension Brother    Hypertension Sister    Lupus Daughter      Social History   Socioeconomic History   Marital status: Married    Spouse name: Not on file   Number of children: Not on file   Years of education: Not on file   Highest education level: Not on file  Occupational History   Not on file  Tobacco Use   Smoking status: Every Day    Packs/day: 1.00    Years: 37.00    Additional pack years: 0.00    Total pack years: 37.00    Types: Cigarettes   Smokeless tobacco: Never   Tobacco comments:    smokes 1/2 pack a day [facility] 03/08/21  Vaping Use   Vaping Use: Never used  Substance and Sexual Activity   Alcohol use: Yes    Alcohol/week: 56.0 standard drinks of alcohol    Types: 35 Cans of beer, 21 Shots of liquor per week    Comment: 5-6 beers and 2-3 shots daily-04/19/21   Drug use: No   Sexual activity: Yes  Other Topics Concern   Not on file  Social History Narrative   Not on file   Social Determinants of Health   Financial Resource Strain: Not on file  Food Insecurity: No Food Insecurity (12/01/2022)   Hunger Vital Sign    Worried About Running Out of Food in the Last Year: Never true    Ran Out  of Food in the Last Year: Never true  Transportation Needs: No Transportation Needs (12/01/2022)   PRAPARE - Administrator, Civil Service (Medical): No    Lack of Transportation (Non-Medical): No  Physical Activity: Inactive (12/03/2020)   Exercise Vital Sign    Days of Exercise per Week: 0 days    Minutes of Exercise per Session: 0 min  Stress: Not on file  Social Connections: Not on file  Intimate Partner Violence: Not At Risk (12/01/2022)   Humiliation, Afraid, Rape, and Kick questionnaire    Fear of Current or Ex-Partner: No    Emotionally Abused: No    Physically Abused: No    Sexually Abused: No    Past Surgical History:  Procedure Laterality Date   BRONCHIAL BIOPSY  12/23/2020   Procedure: BRONCHIAL BIOPSIES;  Surgeon: Leslye Peer, MD;  Location[facility] ENDOSCOPY;  Service: Pulmonary;;   BRONCHIAL  BRUSHINGS  12/23/2020   Procedure: BRONCHIAL BRUSHINGS;  Surgeon: Leslye Peer, MD;  Location: [facility] ENDOSCOPY;  Service: Pulmonary;;   BRONCHIAL NEEDLE ASPIRATION BIOPSY  12/23/2020   Procedure: BRONCHIAL NEEDLE ASPIRATION BIOPSIES;  Surgeon: Leslye Peer, MD;  Location: [facility] ENDOSCOPY;  Service: Pulmonary;;   BRONCHIAL WASHINGS  12/23/2020   Procedure: BRONCHIAL WASHINGS;  Surgeon: Leslye Peer, MD;  Location: [facility] ENDOSCOPY;  Service: Pulmonary;;   CARDIAC CATHETERIZATION  2010   COLONOSCOPY  03/14/2012   Procedure: COLONOSCOPY;  Surgeon: Malissa Hippo, MD;  Location: [facility] ENDO SUITE;  Service: Endoscopy;  Laterality: N/A;  100   INTERCOSTAL NERVE BLOCK  04/21/2021   Procedure: INTERCOSTAL NERVE BLOCK;  Surgeon: Loreli Slot, MD;  Location: [facility] OR;  Service: Thoracic;;   IR IMAGING GUIDED PORT INSERTION  01/12/2021   LUNG REMOVAL, PARTIAL  08/2021   PENILE PROSTHESIS IMPLANT N/A 02/17/2022   Procedure: PENILE PROTHESIS INFLATABLE;  Surgeon: [name], MD;  Location: [facility] ORS;  Service: Urology;  Laterality: N/A;   PORTACATH PLACEMENT  07/2021   REMOVAL OF PENILE PROSTHESIS N/A 10/13/2022   Procedure: REMOVAL OF PENILE PROSTHESIS;  Surgeon: [name], MD;  Location: [facility] ORS;  Service: Urology;  Laterality: N/A;   REMOVAL OF PENILE PROSTHESIS N/A 12/05/2022   Procedure: REMOVAL AND REPLACEMENT OF PENILE PROSTHESIS;  Surgeon: [name], MD;  Location: [facility] ORS;  Service: Urology;  Laterality: N/A;  120 MINUTES NEEDED FOR CASE   VIDEO ASSISTED THORACOSCOPY (VATS)/ LOBECTOMY Left 04/21/2021   Procedure: VIDEO ASSISTED THORACOSCOPY (VATS)/ LOBECTOMY;  Surgeon: Loreli Slot, MD;  Location: [facility] OR;  Service: Thoracic;  Laterality: Left;   VIDEO BRONCHOSCOPY WITH ENDOBRONCHIAL NAVIGATION N/A 12/23/2020   Procedure: VIDEO BRONCHOSCOPY WITH ENDOBRONCHIAL NAVIGATION;  Surgeon: Leslye Peer, MD;  Location: [facility] ENDOSCOPY;  Service: Pulmonary;  Laterality: N/A;    WRIST GANGLION EXCISION  1985      Current Outpatient Medications:    acetaminophen (TYLENOL) 500 MG tablet, Take 2 tablets (1,000 mg total) by mouth every 6 (six) hours., Disp: 45 tablet, Rfl: 0   amLODipine (NORVASC) 5 MG tablet, Take 5 mg by mouth daily., Disp: , Rfl:    amoxicillin-clavulanate (AUGMENTIN) 875-125 MG tablet, Take 1 tablet by mouth 2 (two) times daily., Disp: 28 tablet, Rfl: 0   celecoxib (CELEBREX) 200 MG capsule, Take 1 capsule (200 mg total) by mouth 2 (two) times daily., Disp: 28 capsule, Rfl: 1   Fluticasone-Umeclidin-Vilant (TRELEGY ELLIPTA) 100-62.5-25 MCG/INH AEPB, Inhale 1 puff into the lungs daily as needed (shortness of  breath)., Disp: , Rfl:    lidocaine (XYLOCAINE) 2 % solution, Use as directed 15 mLs in the mouth or throat as needed for mouth pain., Disp: 100 mL, Rfl: 0   metoprolol tartrate (LOPRESSOR) 50 MG tablet, Take 50 mg by mouth daily. (Patient not taking: Reported on 12/01/2022), Disp: , Rfl:    oxyCODONE (OXY IR/ROXICODONE) 5 MG immediate release tablet, Take 1 tablet (5 mg total) by mouth every 4 (four) hours as needed for moderate pain., Disp: 16 tablet, Rfl: 0   PROVENTIL HFA 108 (90 Base) MCG/ACT inhaler, INHALE 2 PUFFS BY MOUTH EVERY 6 HOURS AS NEEDED FOR COUGHING, WHEEZING, OR SHORTNESS OF BREATH (Patient taking differently: Inhale 2 puffs into the lungs every 6 (six) hours as needed for wheezing or shortness of breath.), Disp: 20.1 g, Rfl: 1    Physical Exam: There were no vitals taken for this visit.    Chronically ill male  HEENT: right sided subclavian port a cath  Neck supple with no adenopathy JVP normal no bruits no thyromegaly Lungs emphysema exp wheezes post LULobectomy Heart:  S1/S2 2/6/ SEM murmur, no rub, gallop or click PMI normal Abdomen: benighn, BS positve, no tenderness, no AAA no bruit.  No HSM or HJR Distal pulses intact with no bruits No edema Post penile implant with wound dehiscence    Labs:   Lab Results   Component Value Date   WBC 10.3 12/06/2022   HGB 13.0 12/06/2022   HCT 40.4 12/06/2022   MCV 98.5 12/06/2022   PLT 212 12/06/2022   No results for input(s): "NA", "K", "CL", "CO2", "BUN", "CREATININE", "CALCIUM", "PROT", "BILITOT", "ALKPHOS", "ALT", "AST", "GLUCOSE" in the last 168 hours.  Invalid input(s): "LABALBU" Lab Results  Component Value Date   CKTOTAL 158 03/12/2007   CKMB 1.2 03/12/2007   TROPONINI <0.03 12/16/2017    Lab Results  Component Value Date   CHOL 165 06/05/2020   CHOL 196 09/03/2019   CHOL 174 08/20/2015   Lab Results  Component Value Date   HDL 59 06/05/2020   HDL 63 09/03/2019   HDL 59 08/20/2015   Lab Results  Component Value Date   LDLCALC 84 06/05/2020   LDLCALC 110 (H) 09/03/2019   LDLCALC 90 08/20/2015   Lab Results  Component Value Date   TRIG 111 06/05/2020   TRIG 114 09/03/2019   TRIG 126 08/20/2015   Lab Results  Component Value Date   CHOLHDL 2.8 06/05/2020   CHOLHDL 3.1 09/03/2019   CHOLHDL 2.9 08/20/2015   No results found for: "LDLDIRECT"    Radiology: No results found.  EKG: 12/19/2022 ST rate 105 otherwise normal    ASSESSMENT AND PLAN:   Cardiomegaly:  not clear of diagnosis Normal LV size EF and mild LVH on TTE 10/21/21  HTN:  Well controlled.  Continue current medications and low sodium Dash type diet.   Lung Cancer:  still smoking f/u oncology known metastatic disease currently observation post chemo and LULobectomy Port a cath in place ETOH:  counseled on moderation AST minimally elevated 53 labs 06/08/21  Neuropathy:  cancer/chemo on gabapentin Arthralgias:  been referred to rheumatology ? From immunoRx continue oxycodone  Urology:  Finished Bactrim f/u [facility] for penile implant infection Tachycardia:  sinus see above increase Toprol to 100 mg daily   F/U cardiology in a year  Signed: Charlton Haws 12/14/2022, 8:03 AM

## 2022-12-16 ENCOUNTER — Telehealth: Payer: Self-pay

## 2022-12-16 NOTE — Telephone Encounter (Signed)
Bryan Fleming, Bryan Starring., MD  Bryan Kaufman, MD; Loretha Stapler, RN; Bryan Fleming, Bryan Starring., MD SK, Recommend you all get an updated MRI/MRCP as well as LFTs, since there has been ductal dilation for quite awhile (prior MRCP in 2022 showed 5 mm PD dilation). With this being said, happy to perform an EUS due to the new ?ampullary abnormality as well. If there is significant LFT abnormalities may need ERCP. With history of alcohol use, this may be all chronic pancreatitis related.  Bryan Fleming, Please schedule this patient an EGD/EUS with me next available. In interim, we will see what the MRI/MRCP show and what the LFTs are as I have outlined to Dr. Ellin Saba. Thanks. GM

## 2022-12-19 ENCOUNTER — Encounter: Payer: Self-pay | Admitting: Cardiovascular Disease

## 2022-12-19 ENCOUNTER — Other Ambulatory Visit: Payer: Self-pay

## 2022-12-19 ENCOUNTER — Ambulatory Visit: Payer: Medicare HMO | Attending: Cardiovascular Disease | Admitting: Cardiovascular Disease

## 2022-12-19 VITALS — BP 158/80 | HR 111 | Ht 69.0 in | Wt 152.4 lb

## 2022-12-19 DIAGNOSIS — I10 Essential (primary) hypertension: Secondary | ICD-10-CM

## 2022-12-19 DIAGNOSIS — C3492 Malignant neoplasm of unspecified part of left bronchus or lung: Secondary | ICD-10-CM | POA: Diagnosis not present

## 2022-12-19 DIAGNOSIS — R000 Tachycardia, unspecified: Secondary | ICD-10-CM

## 2022-12-19 DIAGNOSIS — I517 Cardiomegaly: Secondary | ICD-10-CM

## 2022-12-19 DIAGNOSIS — K8689 Other specified diseases of pancreas: Secondary | ICD-10-CM

## 2022-12-19 DIAGNOSIS — K852 Alcohol induced acute pancreatitis without necrosis or infection: Secondary | ICD-10-CM

## 2022-12-19 MED ORDER — METOPROLOL SUCCINATE ER 100 MG PO TB24: 100.0000 mg | ORAL_TABLET | Freq: Every day | ORAL | 3 refills | Status: DC

## 2022-12-19 NOTE — Telephone Encounter (Signed)
EUS/EGD  scheduled, pt instructed and medications reviewed.  Patient instructions mailed to home.  Patient to call with any questions or concerns.  

## 2022-12-19 NOTE — Telephone Encounter (Signed)
The pt has been scheduled for EGD EUS on 03/02/23 at 845 am at [facility] with GM.     Left message on machine to call back

## 2022-12-19 NOTE — Patient Instructions (Signed)
Medication Instructions:  Your physician has recommended you make the following change in your medication:   Increase Toprol XL to 100 mg Daily   *If you need a refill on your cardiac medications before your next appointment, please call your pharmacy*   Lab Work: NONE   If you have labs (blood work) drawn today and your tests are completely normal, you will receive your results only by: MyChart Message (if you have MyChart) OR A paper copy in the mail If you have any lab test that is abnormal or we need to change your treatment, we will call you to review the results.   Testing/Procedures: NONE    Follow-Up: At [facility], you and your health needs are our priority.  As part of our continuing mission to provide you with exceptional heart care, we have created designated Provider Care Teams.  These Care Teams include your primary Cardiologist (physician) and Advanced Practice Providers (APPs -  Physician Assistants and Nurse Practitioners) who all work together to provide you with the care you need, when you need it.  We recommend signing up for the patient portal called "MyChart".  Sign up information is provided on this After Visit Summary.  MyChart is used to connect with patients for Virtual Visits (Telemedicine).  Patients are able to view lab/test results, encounter notes, upcoming appointments, etc.  Non-urgent messages can be sent to your provider as well.   To learn more about what you can do with MyChart, go to [email or web address].    Your next appointment:   1 year(s)  Provider:   Charlton Haws, MD    Other Instructions Thank you for choosing [facility]!

## 2022-12-26 ENCOUNTER — Other Ambulatory Visit: Payer: Self-pay | Admitting: *Deleted

## 2022-12-26 DIAGNOSIS — K8689 Other specified diseases of pancreas: Secondary | ICD-10-CM

## 2023-01-09 ENCOUNTER — Ambulatory Visit (HOSPITAL_COMMUNITY)
Admission: RE | Admit: 2023-01-09 | Discharge: 2023-01-09 | Disposition: A | Discharge status: Home / Self Care | Payer: Medicare HMO | Source: Ambulatory Visit | Attending: Hematology | Admitting: Hematology

## 2023-01-09 DIAGNOSIS — R935 Abnormal findings on diagnostic imaging of other abdominal regions, including retroperitoneum: Secondary | ICD-10-CM | POA: Diagnosis not present

## 2023-01-09 DIAGNOSIS — K8689 Other specified diseases of pancreas: Secondary | ICD-10-CM | POA: Insufficient documentation

## 2023-01-09 DIAGNOSIS — K838 Other specified diseases of biliary tract: Secondary | ICD-10-CM | POA: Diagnosis not present

## 2023-01-09 MED ORDER — GADOBUTROL 1 MMOL/ML IV SOLN
7.0000 mL | Freq: Once | INTRAVENOUS | Status: AC | PRN
  Administered 2023-01-09: 7 mL via INTRAVENOUS

## 2023-01-11 DIAGNOSIS — I10 Essential (primary) hypertension: Secondary | ICD-10-CM | POA: Diagnosis not present

## 2023-01-11 DIAGNOSIS — K219 Gastro-esophageal reflux disease without esophagitis: Secondary | ICD-10-CM | POA: Diagnosis not present

## 2023-02-20 NOTE — Progress Notes (Signed)
Called patient regarding upcoming procedure. No answer and vociemail box is full.

## 2023-02-28 ENCOUNTER — Encounter (HOSPITAL_COMMUNITY): Payer: Self-pay | Admitting: Gastroenterology

## 2023-02-28 NOTE — Progress Notes (Signed)
Attempted pre call for upcoming procedure via telephone, unable to reach at this time. Unable to leave voicemail to return pre surgical testing department's phone call,due to mailbox full.

## 2023-03-02 ENCOUNTER — Encounter (HOSPITAL_COMMUNITY): Payer: Self-pay | Admitting: Anesthesiology

## 2023-03-02 ENCOUNTER — Encounter (HOSPITAL_COMMUNITY): Admission: RE | Payer: Self-pay | Source: Home / Self Care

## 2023-03-02 ENCOUNTER — Ambulatory Visit (HOSPITAL_COMMUNITY): Admission: RE | Admit: 2023-03-02 | Payer: Medicare HMO | Source: Home / Self Care | Admitting: Gastroenterology

## 2023-03-02 ENCOUNTER — Telehealth: Payer: Self-pay

## 2023-03-02 SURGERY — ESOPHAGOGASTRODUODENOSCOPY (EGD) WITH PROPOFOL
Anesthesia: Monitor Anesthesia Care

## 2023-03-02 NOTE — Telephone Encounter (Signed)
-----   Message from [facility] sent at 03/02/2023  9:05 AM EDT ----- Regarding: No Show SK, Good morning.  Hope you are well. This patient did not show up for his EUS today and our team tried to contact him today without avail. If he is interested in moving forward with another EUS slot, you can let me know, its going to be a bit further out at this point. Sorry. GM  Yun Gutierrez, Remove patient from our list for now. GM

## 2023-03-02 NOTE — Anesthesia Preprocedure Evaluation (Deleted)
Anesthesia Evaluation    Reviewed: Allergy & Precautions, Patient's Chart, lab work & pertinent test results, reviewed documented beta blocker date and time   Airway        Dental   Pulmonary shortness of breath, COPD, Current Smoker and Patient abstained from smoking.          Cardiovascular hypertension, Pt. on home beta blockers and Pt. on medications   TTE 2023  1. Left ventricular ejection fraction, by estimation, is 60 to 65%. The  left ventricle has normal function. The left ventricle has no regional  wall motion abnormalities. There is mild left ventricular hypertrophy.  Left ventricular diastolic parameters  are consistent with Grade I diastolic dysfunction (impaired relaxation).   2. Right ventricular systolic function is normal. The right ventricular  size is normal.   3. The mitral valve is normal in structure. No evidence of mitral valve  regurgitation. No evidence of mitral stenosis.   4. The aortic valve is tricuspid. There is mild calcification of the  aortic valve. There is mild thickening of the aortic valve. Aortic valve  regurgitation is mild. No aortic stenosis is present.   5. The inferior vena cava is normal in size with greater than 50%  respiratory variability, suggesting right atrial pressure of 3 mmHg.      Neuro/Psych  PSYCHIATRIC DISORDERS Anxiety Depression    negative neurological ROS     GI/Hepatic ,GERD  ,,(+)     substance abuse  alcohol use  Endo/Other  negative endocrine ROS    Renal/GU negative Renal ROS  negative genitourinary   Musculoskeletal negative musculoskeletal ROS (+)    Abdominal   Peds  Hematology negative hematology ROS (+)   Anesthesia Other Findings   Reproductive/Obstetrics                             Anesthesia Physical Anesthesia Plan  ASA: 3  Anesthesia Plan: MAC   Post-op Pain Management:    Induction: Intravenous  PONV  Risk Score and Plan: Propofol infusion and Treatment may vary due to age or medical condition  Airway Management Planned: Natural Airway  Additional Equipment:   Intra-op Plan:   Post-operative Plan:   Informed Consent: I have reviewed the patients History and Physical, chart, labs and discussed the procedure including the risks, benefits and alternatives for the proposed anesthesia with the patient or authorized representative who has indicated his/her understanding and acceptance.     Dental advisory given  Plan Discussed with: CRNA  Anesthesia Plan Comments: (Pt did not show up for procedure. )       Anesthesia Quick Evaluation

## 2023-03-02 NOTE — Telephone Encounter (Signed)
It is unfortunate that he missed his appointment today with our availability. Based on my availability and trying to keep some open slots for urgent procedures and with him having missed this procedure, he can be scheduled in October. If they cannot wait until then, then he needs to be referred elsewhere. GM

## 2023-03-02 NOTE — Telephone Encounter (Signed)
Dr Meridee Score is it ok to reschedule this pt for EUS ?

## 2023-03-02 NOTE — Telephone Encounter (Signed)
Pt removed from list

## 2023-03-02 NOTE — Telephone Encounter (Signed)
PT wife is calling to reschedule EUS. Please advise of next availability.

## 2023-03-03 NOTE — Telephone Encounter (Signed)
The pt has been rescheduled to 05/15/23 at 1130 am at [facility] with GM He has been reinstructed and new instructions sent to My Chart

## 2023-03-03 NOTE — Telephone Encounter (Signed)
Mail box full will attempt later

## 2023-03-09 ENCOUNTER — Inpatient Hospital Stay: Payer: Medicare HMO | Attending: Hematology

## 2023-03-09 ENCOUNTER — Ambulatory Visit (HOSPITAL_COMMUNITY)
Admission: RE | Admit: 2023-03-09 | Discharge: 2023-03-09 | Disposition: A | Discharge status: Home / Self Care | Payer: Medicare HMO | Source: Ambulatory Visit | Attending: Hematology | Admitting: Hematology

## 2023-03-09 VITALS — BP 160/76 | HR 111 | Temp 97.4°F | Resp 16

## 2023-03-09 DIAGNOSIS — I10 Essential (primary) hypertension: Secondary | ICD-10-CM | POA: Diagnosis not present

## 2023-03-09 DIAGNOSIS — F17210 Nicotine dependence, cigarettes, uncomplicated: Secondary | ICD-10-CM | POA: Diagnosis not present

## 2023-03-09 DIAGNOSIS — G629 Polyneuropathy, unspecified: Secondary | ICD-10-CM | POA: Diagnosis not present

## 2023-03-09 DIAGNOSIS — C3492 Malignant neoplasm of unspecified part of left bronchus or lung: Secondary | ICD-10-CM | POA: Insufficient documentation

## 2023-03-09 DIAGNOSIS — Z800 Family history of malignant neoplasm of digestive organs: Secondary | ICD-10-CM | POA: Diagnosis not present

## 2023-03-09 DIAGNOSIS — Z801 Family history of malignant neoplasm of trachea, bronchus and lung: Secondary | ICD-10-CM | POA: Diagnosis not present

## 2023-03-09 DIAGNOSIS — E785 Hyperlipidemia, unspecified: Secondary | ICD-10-CM | POA: Diagnosis not present

## 2023-03-09 DIAGNOSIS — J432 Centrilobular emphysema: Secondary | ICD-10-CM | POA: Diagnosis not present

## 2023-03-09 DIAGNOSIS — K760 Fatty (change of) liver, not elsewhere classified: Secondary | ICD-10-CM | POA: Insufficient documentation

## 2023-03-09 DIAGNOSIS — K219 Gastro-esophageal reflux disease without esophagitis: Secondary | ICD-10-CM | POA: Diagnosis not present

## 2023-03-09 DIAGNOSIS — J449 Chronic obstructive pulmonary disease, unspecified: Secondary | ICD-10-CM | POA: Diagnosis not present

## 2023-03-09 DIAGNOSIS — C3490 Malignant neoplasm of unspecified part of unspecified bronchus or lung: Secondary | ICD-10-CM | POA: Diagnosis not present

## 2023-03-09 DIAGNOSIS — Z85118 Personal history of other malignant neoplasm of bronchus and lung: Secondary | ICD-10-CM | POA: Insufficient documentation

## 2023-03-09 DIAGNOSIS — I700 Atherosclerosis of aorta: Secondary | ICD-10-CM | POA: Diagnosis not present

## 2023-03-09 LAB — CBC WITH DIFFERENTIAL/PLATELET
Abs Immature Granulocytes: 0.03 10*3/uL (ref 0.00–0.07)
Basophils Absolute: 0.1 10*3/uL (ref 0.0–0.1)
Basophils Relative: 1 %
Eosinophils Absolute: 0.1 10*3/uL (ref 0.0–0.5)
Eosinophils Relative: 1 %
HCT: 44.3 % (ref 39.0–52.0)
Hemoglobin: 14.8 g/dL (ref 13.0–17.0)
Immature Granulocytes: 0 %
Lymphocytes Relative: 48 %
Lymphs Abs: 3.7 10*3/uL (ref 0.7–4.0)
MCH: 32 pg (ref 26.0–34.0)
MCHC: 33.4 g/dL (ref 30.0–36.0)
MCV: 95.9 fL (ref 80.0–100.0)
Monocytes Absolute: 0.7 10*3/uL (ref 0.1–1.0)
Monocytes Relative: 8 %
Neutro Abs: 3.3 10*3/uL (ref 1.7–7.7)
Neutrophils Relative %: 42 %
Platelets: 291 10*3/uL (ref 150–400)
RBC: 4.62 MIL/uL (ref 4.22–5.81)
RDW: 13.9 % (ref 11.5–15.5)
WBC: 7.9 10*3/uL (ref 4.0–10.5)
nRBC: 0 % (ref 0.0–0.2)

## 2023-03-09 LAB — COMPREHENSIVE METABOLIC PANEL
ALT: 18 U/L (ref 0–44)
AST: 37 U/L (ref 15–41)
Albumin: 3.7 g/dL (ref 3.5–5.0)
Alkaline Phosphatase: 90 U/L (ref 38–126)
Anion gap: 10 (ref 5–15)
BUN: 7 mg/dL (ref 6–20)
CO2: 24 mmol/L (ref 22–32)
Calcium: 8.7 mg/dL — ABNORMAL LOW (ref 8.9–10.3)
Chloride: 105 mmol/L (ref 98–111)
Creatinine, Ser: 0.62 mg/dL (ref 0.61–1.24)
GFR, Estimated: >60 mL/min (ref >60)
Glucose, Bld: 102 mg/dL — ABNORMAL HIGH (ref 70–99)
Potassium: 3.4 mmol/L — ABNORMAL LOW (ref 3.5–5.1)
Sodium: 139 mmol/L (ref 135–145)
Total Bilirubin: 0.4 mg/dL (ref 0.3–1.2)
Total Protein: 8 g/dL (ref 6.5–8.1)

## 2023-03-09 MED ORDER — IOHEXOL 300 MG/ML  SOLN
75.0000 mL | Freq: Once | INTRAMUSCULAR | Status: AC | PRN
  Administered 2023-03-09: 75 mL via INTRAVENOUS

## 2023-03-09 MED ORDER — HEPARIN SOD (PORK) LOCK FLUSH 100 UNIT/ML IV SOLN: 500.0000 [IU] | Freq: Once | INTRAVENOUS | Status: DC

## 2023-03-09 MED ORDER — SODIUM CHLORIDE 0.9% FLUSH
10.0000 mL | Freq: Once | INTRAVENOUS | Status: AC
  Administered 2023-03-09: 10 mL via INTRAVENOUS

## 2023-03-09 MED ORDER — HEPARIN SOD (PORK) LOCK FLUSH 100 UNIT/ML IV SOLN
500.0000 [IU] | Freq: Once | INTRAVENOUS | Status: AC
  Administered 2023-03-09: 500 [IU] via INTRAVENOUS

## 2023-03-09 MED ORDER — HEPARIN SOD (PORK) LOCK FLUSH 100 UNIT/ML IV SOLN
INTRAVENOUS | Status: AC
  Filled 2023-03-09: qty 5

## 2023-03-15 NOTE — Progress Notes (Signed)
[facility] [address] [place] [address]    Clinic Day:  03/16/2023  Referring physician: Benetta Spar*  Patient Care Team: Benetta Spar, MD as PCP - General (Internal Medicine) Wendall Stade, MD as PCP - Cardiology (Cardiology) Therese Sarah, RN as Oncology Nurse Navigator (Oncology) Doreatha Massed, MD as Medical Oncologist (Medical Oncology)   ASSESSMENT & PLAN:   Assessment: 1.  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 Craige Cotta for left upper lobe lung mass. - Is current active smoker, 1/2 pack/day for 35 years. - CT lung cancer screening scan on 10/16/2020 showed cavitary mass in the anterior left upper lobe measuring 4.4 x 3.0 x 3.5 cm with 2.5 cm solid nodular component inferomedially.  This abuts and involves the anterior pleura/chest wall.  Expansile lytic lesion in the right posterolateral ninth and 10th ribs (unchanged dating back to CT from 06/16/2016).  10 mm short axis AP window node suspicious for nodal metastasis. - No change in baseline cough.  No hemoptysis.  No chest pain. - Brain MRI was negative for metastatic disease. - PET scan on 12/14/2020 showed left upper lobe mass measuring 3.9 x 4 cm with SUV 17.1.  Some contiguous hypermetabolism along the left chest wall including anterolateral left third rib with SUV 5.4.  No gross osseous destruction.  AP window lymph node of 9 mm, mildly hypermetabolic, SUV 2.8. - On 12/23/2020 left upper lobe brushing and lavage consistent with squamous cell carcinoma. - Left upper lobe fine-needle aspiration was also consistent with squamous cell carcinoma.  Lymph node could not be reached. - Caris testing with limited tissue showed MMR proficient, BRAF V6 energy negative, PD-L1 negative by 22 C3, 28-8, SP142.  (IHC/CISH results should be interpreted with caution given the potential for false negative results) - 3 cycles of carboplatin,  paclitaxel and nivolumab from 01/13/2021 through 02/24/2021 based on [id number]. - Left upper lobectomy and lymph node resection on 04/21/2021. - Pathology consistent with invasive moderately differentiated squamous cell carcinoma, 5 cm, margins negative.  Visceral pleura not involved.  Metastatic squamous cell carcinoma in 1/2 hilar lymph nodes.  Levels 5, 11, 12, 10, 11 and 12 lymph nodes were negative.  No definitive tumor response identified.  No lymphovascular invasion.  ypT2b, ypN1. - Caris testing on resection sample from 04/21/2021 shows PD-L1 TPS 1%.  No targetable mutations.  MSI stable.  TMB low.   2.  Social/family history: - Previously worked in Designer, fashion/clothing, currently on disability. - Current active smoker, 1/2 pack/day for 35 years. - Drinks 6 pack of beers along with 1 quart of liquor every day. - 1 brother had throat cancer and another brother had lung cancer.  Plan: 1.  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  Again discussed about smoking cessation. - Reviewed CT chest from 03/09/2023: No evidence of recurrence or metastatic disease.  Hepatic steatosis. - Reviewed labs from 03/28/2023: Normal LFTs and CBC. - Recommend follow-up in 6 months with repeat CT scan of the chest and labs.   2.  Neuropathy: - Continue Cymbalta 60 mg daily.  Numbness is stable.  3.  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 - MRCP on 01/09/2023: Unchanged diffuse pancreatic ductal dilatation to the ampulla measuring 0.7 cm, similar to exam dated 2021.  This could be sequela from prior pancreatitis. - He missed appointment with Dr. Meridee Score for ERCP. - We have discussed findings on the MRCP.  He wants to delay ERCP at this time.  He will call us should he develop any abdominal pain.  Orders Placed This Encounter  Procedures   CT CHEST W CONTRAST    Standing Status:   Future     Standing Expiration Date:   03/15/2024    Order Specific Question:   If indicated for the ordered procedure, I authorize the administration of contrast media per Radiology protocol    Answer:   Yes    Order Specific Question:   Does the patient have a contrast media/X-ray dye allergy?    Answer:   No    Order Specific Question:   Preferred imaging location?    Answer:   [facility]    Order Specific Question:   Release to patient    Answer:   Immediate [1]   Comprehensive metabolic panel    Standing Status:   Future    Standing Expiration Date:   03/15/2024    Order Specific Question:   Release to patient    Answer:   Immediate   CBC with Differential/Platelet    Standing Status:   Future    Standing Expiration Date:   03/15/2024    Order Specific Question:   Release to patient    Answer:   Immediate      I,Helena R Teague,acting as a scribe for Doreatha Massed, MD.,have documented all relevant documentation on the behalf of Doreatha Massed, MD,as directed by  Doreatha Massed, MD while in the presence of Doreatha Massed, MD.  I, Doreatha Massed MD, have reviewed the above documentation for accuracy and completeness, and I agree with the above.    Doreatha Massed, MD   8/8/20245:17 PM  CHIEF COMPLAINT:   Diagnosis: bronchogenic carcnioma in left upper lobe of lung    Cancer Staging  Primary lung squamous cell carcinoma, left (HCC) Staging form: Lung, AJCC 8th Edition - Clinical stage from 12/31/2020: Stage IIIA (cT2b, cN2, cM0) - Signed by Doreatha Massed, MD on 01/01/2021    Prior Therapy: carboplatin, paclitaxel and nivolumab q21d x3 cycles   Current Therapy:  surveillance    HISTORY OF PRESENT ILLNESS:   Oncology History  Primary lung squamous cell carcinoma, left (HCC)  12/31/2020 Cancer Staging   Staging form: Lung, AJCC 8th Edition - Clinical stage from 12/31/2020: Stage IIIA (cT2b, cN2, cM0) - Signed by Doreatha Massed, MD  on 01/01/2021 Stage prefix: Initial diagnosis   01/01/2021 Initial Diagnosis   Primary lung squamous cell carcinoma, left (HCC)   01/13/2021 - 02/24/2021 Chemotherapy   Patient is on Treatment Plan : LUNG NSCLC Carboplatin / Paclitaxel q21d x 3 cycles        INTERVAL HISTORY:   Bryan Fleming is a 62 y.o. male presenting to clinic today for follow up of bronchogenic carcnioma in left upper lobe of lung. He was last seen by me on 11/07/22.  Since his last visit, he underwent chest CT on 03/09/23 that found: no evidence of recurrent or metastatic disease and the liver appearing steatotic.   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 His prothesis was removed and replaced with Dr. Lafonda Mosses on 4/29.   Today, he states that he is doing well overall. His appetite level is at 85%. His energy level is at 20%.  PAST MEDICAL  HISTORY:   Past Medical History: Past Medical History:  Diagnosis Date   Cancer (HCC)    Lung cancer   COPD (chronic obstructive pulmonary disease) (HCC) 2007   EMPHYSEMA   Depression    Dyspnea    occasional - no oxygen   GERD (gastroesophageal reflux disease) 2010   Hepatitis    Alcoholic    HLD (hyperlipidemia)    no meds   Hypertension    Port-A-Cath in place 01/01/2021    Surgical History: Past Surgical History:  Procedure Laterality Date   BRONCHIAL BIOPSY  12/23/2020   Procedure: BRONCHIAL BIOPSIES;  Surgeon: Leslye Peer, MD;  Location: [facility] ENDOSCOPY;  Service: Pulmonary;;   BRONCHIAL BRUSHINGS  12/23/2020   Procedure: BRONCHIAL BRUSHINGS;  Surgeon: Leslye Peer, MD;  Location: [facility] ENDOSCOPY;  Service: Pulmonary;;   BRONCHIAL NEEDLE ASPIRATION BIOPSY  12/23/2020   Procedure: BRONCHIAL NEEDLE ASPIRATION BIOPSIES;  Surgeon: Leslye Peer, MD;  Location[facility] ENDOSCOPY;  Service: Pulmonary;;   BRONCHIAL WASHINGS  12/23/2020   Procedure: BRONCHIAL WASHINGS;  Surgeon: Leslye Peer, MD;  Location: [facility] ENDOSCOPY;   Service: Pulmonary;;   CARDIAC CATHETERIZATION  2010   COLONOSCOPY  03/14/2012   Procedure: COLONOSCOPY;  Surgeon: Malissa Hippo, MD;  Location: [facility] ENDO SUITE;  Service: Endoscopy;  Laterality: N/A;  100   INTERCOSTAL NERVE BLOCK  04/21/2021   Procedure: INTERCOSTAL NERVE BLOCK;  Surgeon: Loreli Slot, MD;  Location: [facility] OR;  Service: Thoracic;;   IR IMAGING GUIDED PORT INSERTION  01/12/2021   LUNG REMOVAL, PARTIAL  08/2021   PENILE PROSTHESIS IMPLANT N/A 02/17/2022   Procedure: PENILE PROTHESIS INFLATABLE;  Surgeon: [name], MD;  Location: [facility] ORS;  Service: Urology;  Laterality: N/A;   PORTACATH PLACEMENT  07/2021   REMOVAL OF PENILE PROSTHESIS N/A 10/13/2022   Procedure: REMOVAL OF PENILE PROSTHESIS;  Surgeon: [name], MD;  Location: [facility] ORS;  Service: Urology;  Laterality: N/A;   REMOVAL OF PENILE PROSTHESIS N/A 12/05/2022   Procedure: REMOVAL AND REPLACEMENT OF PENILE PROSTHESIS;  Surgeon: [name], MD;  Location: [facility] ORS;  Service: Urology;  Laterality: N/A;  120 MINUTES NEEDED FOR CASE   VIDEO ASSISTED THORACOSCOPY (VATS)/ LOBECTOMY Left 04/21/2021   Procedure: VIDEO ASSISTED THORACOSCOPY (VATS)/ LOBECTOMY;  Surgeon: Loreli Slot, MD;  Location: [facility] OR;  Service: Thoracic;  Laterality: Left;   VIDEO BRONCHOSCOPY WITH ENDOBRONCHIAL NAVIGATION N/A 12/23/2020   Procedure: VIDEO BRONCHOSCOPY WITH ENDOBRONCHIAL NAVIGATION;  Surgeon: Leslye Peer, MD;  Location: [facility] ENDOSCOPY;  Service: Pulmonary;  Laterality: N/A;   WRIST GANGLION EXCISION  1985    Social History: Social History   Socioeconomic History   Marital status: Married    Spouse name: Not on file   Number of children: Not on file   Years of education: Not on file   Highest education level: Not on file  Occupational History   Not on file  Tobacco Use   Smoking status: Every Day    Current packs/day: 1.00    Average packs/day: 1 pack/day for 37.0 years (37.0 ttl pk-yrs)     Types: Cigarettes   Smokeless tobacco: Never   Tobacco comments:    smokes 1/2 pack a day [facility] 03/08/21  Vaping Use   Vaping status: Never Used  Substance and Sexual Activity   Alcohol use: Yes    Alcohol/week: 56.0 standard drinks of alcohol    Types: 35 Cans of beer, 21 Shots of liquor per week  Comment: 5-6 beers and 2-3 shots daily-04/19/21   Drug use: No   Sexual activity: Yes  Other Topics Concern   Not on file  Social History Narrative   Not on file   Social Determinants of Health   Financial Resource Strain: Not on file  Food Insecurity: No Food Insecurity (12/01/2022)   Hunger Vital Sign    Worried About Running Out of Food in the Last Year: Never true    Ran Out of Food in the Last Year: Never true  Transportation Needs: No Transportation Needs (12/01/2022)   PRAPARE - Administrator, Civil Service (Medical): No    Lack of Transportation (Non-Medical): No  Physical Activity: Inactive (12/03/2020)   Exercise Vital Sign    Days of Exercise per Week: 0 days    Minutes of Exercise per Session: 0 min  Stress: Not on file  Social Connections: Not on file  Intimate Partner Violence: Not At Risk (12/01/2022)   Humiliation, Afraid, Rape, and Kick questionnaire    Fear of Current or Ex-Partner: No    Emotionally Abused: No    Physically Abused: No    Sexually Abused: No    Family History: Family History  Problem Relation Age of Onset   Hypertension Mother    Asthma Father    Diabetes Sister    Hypertension Sister    Hypertension Brother    Hypertension Sister    Lupus Daughter     Current Medications:  Current Outpatient Medications:    acetaminophen (TYLENOL) 500 MG tablet, Take 2 tablets (1,000 mg total) by mouth every 6 (six) hours., Disp: 45 tablet, Rfl: 0   albuterol (VENTOLIN HFA) 108 (90 Base) MCG/ACT inhaler, Inhale 1-2 puffs into the lungs every 6 (six) hours as needed for wheezing or shortness of breath., Disp: , Rfl:    amLODipine (NORVASC)  5 MG tablet, Take 5 mg by mouth in the morning., Disp: , Rfl:    celecoxib (CELEBREX) 200 MG capsule, Take 1 capsule (200 mg total) by mouth 2 (two) times daily., Disp: 28 capsule, Rfl: 1   Fluticasone-Umeclidin-Vilant (TRELEGY ELLIPTA) 100-62.5-25 MCG/INH AEPB, Inhale 1 puff into the lungs in the morning., Disp: , Rfl:    gabapentin (NEURONTIN) 300 MG capsule, Take 300 mg by mouth 3 (three) times daily., Disp: , Rfl:    gemfibrozil (LOPID) 600 MG tablet, Take 600 mg by mouth 2 (two) times daily., Disp: , Rfl:    metoprolol succinate (TOPROL-XL) 100 MG 24 hr tablet, Take 1 tablet (100 mg total) by mouth daily. Take with or immediately following a meal., Disp: 90 tablet, Rfl: 3   Multiple Vitamin (MULTIVITAMIN WITH MINERALS) TABS tablet, Take 1 tablet by mouth in the morning., Disp: , Rfl:    Allergies: Allergies  Allergen Reactions   Other Nausea Only and Other (See Comments)    No spicy foods- Patient cannot tolerate- HEARTBURN    REVIEW OF SYSTEMS:   Review of Systems  Constitutional:  Negative for chills, fatigue and fever.  HENT:   Negative for lump/mass, mouth sores, nosebleeds, sore throat and trouble swallowing.   Eyes:  Negative for eye problems.  Respiratory:  Positive for cough and shortness of breath.   Cardiovascular:  Negative for chest pain, leg swelling and palpitations.  Gastrointestinal:  Negative for abdominal pain, constipation, diarrhea, nausea and vomiting.  Genitourinary:  Negative for bladder incontinence, difficulty urinating, dysuria, frequency, hematuria and nocturia.   Musculoskeletal:  Negative for arthralgias, back pain, flank pain,  myalgias and neck pain.  Skin:  Negative for itching and rash.  Neurological:  Negative for dizziness, headaches and numbness.  Hematological:  Does not bruise/bleed easily.  Psychiatric/Behavioral:  Negative for depression, sleep disturbance and suicidal ideas. The patient is not nervous/anxious.   All other systems reviewed and  are negative.    VITALS:   Blood pressure (!) 179/90, pulse (!) 128, temperature 98.8 F (37.1 C), temperature source Oral, resp. rate 20, weight 151 lb 9.6 oz (68.8 kg), SpO2 93%.  Wt Readings from Last 3 Encounters:  03/16/23 151 lb 9.6 oz (68.8 kg)  12/19/22 152 lb 6.4 oz (69.1 kg)  12/05/22 149 lb 0.5 oz (67.6 kg)    Body mass index is 22.39 kg/m.  Performance status (ECOG): 1 - Symptomatic but completely ambulatory  PHYSICAL EXAM:   Physical Exam Vitals and nursing note reviewed. Exam conducted with a chaperone present.  Constitutional:      Appearance: Normal appearance.  Cardiovascular:     Rate and Rhythm: Normal rate and regular rhythm.     Pulses: Normal pulses.     Heart sounds: Normal heart sounds.  Pulmonary:     Effort: Pulmonary effort is normal.     Breath sounds: Normal breath sounds.  Abdominal:     Palpations: Abdomen is soft. There is no hepatomegaly, splenomegaly or mass.     Tenderness: There is no abdominal tenderness.  Musculoskeletal:     Right lower leg: No edema.     Left lower leg: No edema.  Lymphadenopathy:     Cervical: No cervical adenopathy.     Right cervical: No superficial, deep or posterior cervical adenopathy.    Left cervical: No superficial, deep or posterior cervical adenopathy.     Upper Body:     Right upper body: No supraclavicular or axillary adenopathy.     Left upper body: No supraclavicular or axillary adenopathy.  Neurological:     General: No focal deficit present.     Mental Status: He is alert and oriented to person, place, and time.  Psychiatric:        Mood and Affect: Mood normal.        Behavior: Behavior normal.     LABS:      Latest Ref Rng & Units 03/09/2023   12:08 PM 12/06/2022    7:00 AM 12/05/2022    4:36 AM  CBC  WBC 4.0 - 10.5 K/uL 7.9  10.3  7.9   Hemoglobin 13.0 - 17.0 g/dL 16.1  09.6  04.5   Hematocrit 39.0 - 52.0 % 44.3  40.4  43.1   Platelets 150 - 400 K/uL 291  212  214       Latest  Ref Rng & Units 03/09/2023   12:08 PM 12/06/2022    7:00 AM 12/06/2022    4:41 AM  CMP  Glucose 70 - 99 mg/dL 409  811    BUN 6 - 20 mg/dL 7  13    Creatinine 9.14 - 1.24 mg/dL 7.82  9.56  2.13   Sodium 135 - 145 mmol/L 139  136    Potassium 3.5 - 5.1 mmol/L 3.4  3.5    Chloride 98 - 111 mmol/L 105  103    CO2 22 - 32 mmol/L 24  23    Calcium 8.9 - 10.3 mg/dL 8.7  9.0    Total Protein 6.5 - 8.1 g/dL 8.0     Total Bilirubin 0.3 - 1.2 mg/dL 0.4  Alkaline Phos 38 - 126 U/L 90     AST 15 - 41 U/L 37     ALT 0 - 44 U/L 18        No results found for: "CEA1", "CEA" / No results found for: "CEA1", "CEA" No results found for: "PSA1" No results found for: "BMW413" No results found for: "CAN125"  No results found for: "TOTALPROTELP", "ALBUMINELP", "A1GS", "A2GS", "BETS", "BETA2SER", "GAMS", "MSPIKE", "SPEI" No results found for: "TIBC", "FERRITIN", "IRONPCTSAT" No results found for: "LDH"   STUDIES:   CT Chest W Contrast  Result Date: 03/15/2023 CLINICAL DATA:  Non-small cell lung cancer.  * Tracking Code: BO * EXAM: CT CHEST WITH CONTRAST TECHNIQUE: Multidetector CT imaging of the chest was performed during intravenous contrast administration. RADIATION DOSE REDUCTION: This exam was performed according to the departmental dose-optimization program which includes automated exposure control, adjustment of the mA and/or kV according to patient size and/or use of iterative reconstruction technique. CONTRAST:  75mL OMNIPAQUE IOHEXOL 300 MG/ML  SOLN COMPARISON:  MR abdomen 01/09/2023, CT abdomen 11/02/2022 and CT chest 09/15/2022. FINDINGS: Cardiovascular: Right IJ Port-A-Cath terminates in the low SVC. Atherosclerotic calcification of the aorta, aortic valve and coronary arteries. Heart is at the upper limits of normal in size. No pericardial effusion. Mediastinum/Nodes: Right hilar lymph nodes measure up to 1.5 cm, similar. No mediastinal, left hilar or axillary adenopathy. Esophagus is grossly  unremarkable. Lungs/Pleura: Centrilobular and paraseptal emphysema. Vague peribronchovascular ground-glass in the anterolateral right lower lobe, new and likely infectious/inflammatory in etiology. Left upper lobectomy. Lungs are otherwise clear. No pleural fluid. Debris is seen in the airway. Upper Abdomen: Liver is slightly decreased in attenuation diffusely. Visualized portions of the liver and right adrenal gland are otherwise unremarkable. Slight thickening of the lateral limb left adrenal gland, unchanged and likely benign. Recommend continued attention on follow-up. Visualized portions of the kidneys, spleen, pancreas, stomach and bowel are grossly unremarkable with the exception of a small hiatal hernia. Musculoskeletal: No worrisome lytic or sclerotic lesions. Old bilateral rib fractures. IMPRESSION: 1. No evidence of recurrent or metastatic disease. 2. Liver appears steatotic. 3. Aortic atherosclerosis (ICD10-I70.0). Coronary artery calcification. 4.  Emphysema (ICD10-J43.9). Electronically Signed   By: Leanna Battles M.D.   On: 03/15/2023 11:37

## 2023-03-16 ENCOUNTER — Inpatient Hospital Stay (HOSPITAL_BASED_OUTPATIENT_CLINIC_OR_DEPARTMENT_OTHER): Payer: Medicare HMO | Admitting: Hematology

## 2023-03-16 VITALS — BP 179/90 | HR 128 | Temp 98.8°F | Resp 20 | Wt 151.6 lb

## 2023-03-16 DIAGNOSIS — Z801 Family history of malignant neoplasm of trachea, bronchus and lung: Secondary | ICD-10-CM | POA: Diagnosis not present

## 2023-03-16 DIAGNOSIS — K760 Fatty (change of) liver, not elsewhere classified: Secondary | ICD-10-CM | POA: Diagnosis not present

## 2023-03-16 DIAGNOSIS — F17210 Nicotine dependence, cigarettes, uncomplicated: Secondary | ICD-10-CM | POA: Diagnosis not present

## 2023-03-16 DIAGNOSIS — Z800 Family history of malignant neoplasm of digestive organs: Secondary | ICD-10-CM | POA: Diagnosis not present

## 2023-03-16 DIAGNOSIS — C3492 Malignant neoplasm of unspecified part of left bronchus or lung: Secondary | ICD-10-CM | POA: Diagnosis not present

## 2023-03-16 DIAGNOSIS — G629 Polyneuropathy, unspecified: Secondary | ICD-10-CM | POA: Diagnosis not present

## 2023-03-16 NOTE — Patient Instructions (Addendum)
[facility]  Discharge Instructions  You were seen and examined today by Dr. Ellin Saba.  Dr. Ellin Saba discussed your most recent lab work and CT scan which revealed that everything looks good and stable.  Call for sooner appointment if you are having any abdominal pain.  Follow-up as scheduled in 6 months.    Thank you for choosing [facility] to provide your oncology and hematology care.   To afford each patient quality time with our provider, please arrive at least 15 minutes before your scheduled appointment time. You may need to reschedule your appointment if you arrive late (10 or more minutes). Arriving late affects you and other patients whose appointments are after yours.  Also, if you miss three or more appointments without notifying the office, you may be dismissed from the clinic at the provider's discretion.    Again, thank you for choosing [facility].  Our hope is that these requests will decrease the amount of time that you wait before being seen by our physicians.   If you have a lab appointment with the Cancer Center - please note that after April 8th, all labs will be drawn in the cancer center.  You do not have to check in or register with the main entrance as you have in the past but will complete your check-in at the cancer center.            _____________________________________________________________  Should you have questions after your visit to [facility], please contact our office at [phone] and follow the prompts.  Our office hours are 8:00 a.m. to 4:30 p.m. Monday - Thursday and 8:00 a.m. to 2:30 p.m. Friday.  Please note that voicemails left after 4:00 p.m. may not be returned until the following business day.  We are closed weekends and all major holidays.  You do have access to a nurse 24-7, just call the main number to the clinic [phone] and do not press any  options, hold on the line and a nurse will answer the phone.    For prescription refill requests, have your pharmacy contact our office and allow 72 hours.    Masks are no longer required in the cancer centers. If you would like for your care team to wear a mask while they are taking care of you, please let them know. You may have one support person who is at least 61 years old accompany you for your appointments.

## 2023-04-09 DIAGNOSIS — I10 Essential (primary) hypertension: Secondary | ICD-10-CM | POA: Diagnosis not present

## 2023-04-09 DIAGNOSIS — K219 Gastro-esophageal reflux disease without esophagitis: Secondary | ICD-10-CM | POA: Diagnosis not present

## 2023-05-08 ENCOUNTER — Encounter (HOSPITAL_COMMUNITY): Payer: Self-pay | Admitting: Gastroenterology

## 2023-05-08 NOTE — Progress Notes (Signed)
Attempted to obtain medical history via telephone, unable to reach at this time. Unable to leave voicemail to return pre surgical testing department's phone call,due to mailbox full.  

## 2023-05-09 DIAGNOSIS — K219 Gastro-esophageal reflux disease without esophagitis: Secondary | ICD-10-CM | POA: Diagnosis not present

## 2023-05-09 DIAGNOSIS — I10 Essential (primary) hypertension: Secondary | ICD-10-CM | POA: Diagnosis not present

## 2023-05-15 ENCOUNTER — Ambulatory Visit (HOSPITAL_COMMUNITY): Payer: Medicare HMO | Admitting: Anesthesiology

## 2023-05-15 ENCOUNTER — Other Ambulatory Visit: Payer: Self-pay

## 2023-05-15 ENCOUNTER — Encounter (HOSPITAL_COMMUNITY)
Admission: RE | Disposition: A | Discharge status: Home / Self Care | Payer: Self-pay | Source: Home / Self Care | Attending: Gastroenterology

## 2023-05-15 ENCOUNTER — Ambulatory Visit (HOSPITAL_COMMUNITY)
Admission: RE | Admit: 2023-05-15 | Discharge: 2023-05-15 | Disposition: A | Discharge status: Home / Self Care | Payer: Medicare HMO | Attending: Gastroenterology | Admitting: Gastroenterology

## 2023-05-15 ENCOUNTER — Encounter (HOSPITAL_COMMUNITY): Payer: Self-pay | Admitting: Gastroenterology

## 2023-05-15 DIAGNOSIS — K449 Diaphragmatic hernia without obstruction or gangrene: Secondary | ICD-10-CM | POA: Diagnosis not present

## 2023-05-15 DIAGNOSIS — K2289 Other specified disease of esophagus: Secondary | ICD-10-CM | POA: Insufficient documentation

## 2023-05-15 DIAGNOSIS — K8689 Other specified diseases of pancreas: Secondary | ICD-10-CM | POA: Diagnosis not present

## 2023-05-15 DIAGNOSIS — I10 Essential (primary) hypertension: Secondary | ICD-10-CM | POA: Diagnosis not present

## 2023-05-15 DIAGNOSIS — J449 Chronic obstructive pulmonary disease, unspecified: Secondary | ICD-10-CM | POA: Diagnosis not present

## 2023-05-15 DIAGNOSIS — K852 Alcohol induced acute pancreatitis without necrosis or infection: Secondary | ICD-10-CM | POA: Diagnosis not present

## 2023-05-15 DIAGNOSIS — K8020 Calculus of gallbladder without cholecystitis without obstruction: Secondary | ICD-10-CM | POA: Diagnosis not present

## 2023-05-15 DIAGNOSIS — K2990 Gastroduodenitis, unspecified, without bleeding: Secondary | ICD-10-CM

## 2023-05-15 DIAGNOSIS — K859 Acute pancreatitis without necrosis or infection, unspecified: Secondary | ICD-10-CM | POA: Insufficient documentation

## 2023-05-15 DIAGNOSIS — F17210 Nicotine dependence, cigarettes, uncomplicated: Secondary | ICD-10-CM | POA: Diagnosis not present

## 2023-05-15 DIAGNOSIS — K838 Other specified diseases of biliary tract: Secondary | ICD-10-CM | POA: Diagnosis present

## 2023-05-15 DIAGNOSIS — K259 Gastric ulcer, unspecified as acute or chronic, without hemorrhage or perforation: Secondary | ICD-10-CM | POA: Insufficient documentation

## 2023-05-15 DIAGNOSIS — J439 Emphysema, unspecified: Secondary | ICD-10-CM | POA: Diagnosis not present

## 2023-05-15 DIAGNOSIS — I899 Noninfective disorder of lymphatic vessels and lymph nodes, unspecified: Secondary | ICD-10-CM | POA: Diagnosis not present

## 2023-05-15 DIAGNOSIS — K2970 Gastritis, unspecified, without bleeding: Secondary | ICD-10-CM | POA: Diagnosis not present

## 2023-05-15 DIAGNOSIS — K2950 Unspecified chronic gastritis without bleeding: Secondary | ICD-10-CM | POA: Diagnosis not present

## 2023-05-15 DIAGNOSIS — K219 Gastro-esophageal reflux disease without esophagitis: Secondary | ICD-10-CM | POA: Diagnosis not present

## 2023-05-15 DIAGNOSIS — K3189 Other diseases of stomach and duodenum: Secondary | ICD-10-CM | POA: Diagnosis not present

## 2023-05-15 HISTORY — PX: EUS: SHX5427

## 2023-05-15 HISTORY — PX: ESOPHAGOGASTRODUODENOSCOPY (EGD) WITH PROPOFOL: SHX5813

## 2023-05-15 HISTORY — PX: BIOPSY: SHX5522

## 2023-05-15 SURGERY — ESOPHAGOGASTRODUODENOSCOPY (EGD) WITH PROPOFOL
Anesthesia: Monitor Anesthesia Care

## 2023-05-15 MED ORDER — IPRATROPIUM-ALBUTEROL 0.5-2.5 (3) MG/3ML IN SOLN
RESPIRATORY_TRACT | Status: AC
  Filled 2023-05-15: qty 3

## 2023-05-15 MED ORDER — PROPOFOL 1000 MG/100ML IV EMUL
INTRAVENOUS | Status: AC
  Filled 2023-05-15: qty 100

## 2023-05-15 MED ORDER — FENTANYL CITRATE (PF) 100 MCG/2ML IJ SOLN
INTRAMUSCULAR | Status: AC
  Filled 2023-05-15: qty 2

## 2023-05-15 MED ORDER — OMEPRAZOLE 40 MG PO CPDR: 40.0000 mg | DELAYED_RELEASE_CAPSULE | Freq: Two times a day (BID) | ORAL | 12 refills | Status: DC

## 2023-05-15 MED ORDER — PROPOFOL 10 MG/ML IV BOLUS
INTRAVENOUS | Status: DC | PRN
Start: 2023-05-15 — End: 2023-05-15
  Administered 2023-05-15: 20 mg via INTRAVENOUS
  Administered 2023-05-15: 80 mg via INTRAVENOUS

## 2023-05-15 MED ORDER — PROPOFOL 500 MG/50ML IV EMUL
INTRAVENOUS | Status: DC | PRN
  Administered 2023-05-15: 125 ug/kg/min via INTRAVENOUS

## 2023-05-15 MED ORDER — SODIUM CHLORIDE 0.9 % IV SOLN: INTRAVENOUS | Status: DC

## 2023-05-15 MED ORDER — LIDOCAINE 2% (20 MG/ML) 5 ML SYRINGE
INTRAMUSCULAR | Status: DC | PRN
Start: 2023-05-15 — End: 2023-05-15
  Administered 2023-05-15: 80 mg via INTRAVENOUS

## 2023-05-15 MED ORDER — FENTANYL CITRATE (PF) 100 MCG/2ML IJ SOLN
INTRAMUSCULAR | Status: DC | PRN
  Administered 2023-05-15 (×2): 25 ug via INTRAVENOUS

## 2023-05-15 MED ORDER — IPRATROPIUM-ALBUTEROL 0.5-2.5 (3) MG/3ML IN SOLN
3.0000 mL | Freq: Once | RESPIRATORY_TRACT | Status: AC
  Administered 2023-05-15: 3 mL via RESPIRATORY_TRACT

## 2023-05-15 MED ORDER — LACTATED RINGERS IV SOLN: INTRAVENOUS | Status: DC

## 2023-05-15 SURGICAL SUPPLY — 15 items

## 2023-05-15 NOTE — Op Note (Addendum)
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 Estimated Blood Loss:     Estimated blood loss was minimal. Procedure:                Pre-Anesthesia Assessment:                           - Prior to the procedure, a History and Physical                            was performed, and patient medications and                            allergies were reviewed. The patient's tolerance of                            previous anesthesia was also reviewed. The risks                            and benefits of the procedure and the sedation                            options and risks were discussed with the patient.                            All questions were answered, and informed consent                            was obtained. Prior Anticoagulants: The patient has                            taken no anticoagulant or antiplatelet agents. ASA                            Grade Assessment: III - A patient with severe  systemic disease. After reviewing the risks and                            benefits, the patient was deemed in satisfactory                            condition to undergo the procedure.                           After obtaining informed consent, the endoscope was                            passed under  direct vision. Throughout the                            procedure, the patient's blood pressure, pulse, and                            oxygen saturations were monitored continuously. The                            [id number]-H190 ([id number]) Olympus endoscope was introduced                            through the mouth, and advanced to the second part                            of duodenum. The [id number] ([id number]) Olympus linear                            ultrasound scope was introduced through the mouth,                            and advanced to the duodenum for ultrasound                            examination from the stomach and duodenum. The                            upper EUS was accomplished without difficulty. The                            patient tolerated the procedure. Scope In: Scope Out: Findings:      Obvious mass?lesion noted on the vallecular fold and cornicullate       cartilage.      ENDOSCOPIC FINDING: :      No gross lesions were noted in the entire esophagus.      The Z-line was irregular and was found 40 cm from the incisors.      A 4 cm hiatal hernia was present.      Two non-bleeding cratered gastric ulcers with a clean ulcer base       (Forrest Class III) were found in the gastric antrum. The largest lesion       was 10 mm in largest dimension.  Patchy moderate inflammation characterized by erosions, erythema and       granularity was found in the entire examined stomach. Biopsies were       taken with a cold forceps for histology and Helicobacter pylori testing.      No gross lesions were noted in the duodenal bulb.      Diffuse moderately congested mucosa without active bleeding and with no       stigmata of bleeding was found in the first portion of the duodenum and       in the second portion of the duodenum. Biopsies were taken with a cold       forceps for histology.      Congested mucosa without active bleeding and with no stigmata of       bleeding was  found at the major papilla.      ENDOSONOGRAPHIC FINDING: :      Pancreatic parenchymal abnormalities were noted in the pancreatic head,       genu of the pancreas, pancreatic body and pancreatic tail. These       consisted of hyperechoic foci, lobularity without honeycombing and       hyperechoic strands.      The diameter of the main pancreatic duct (MPD) was dilated and tortuous       and measured:      - HOP 4.6 mm (head of pancreas)      - NOP 6.1 -> 5.1 mm (neck of pancreas)      - BOP 4 mm (body of the pancreas)      - TOP 2 mm (tail of the pancreas).      Two stones were visualized endosonographically in the gallbladder. The       stones were round. They were hyperechoic and characterized by shadowing.      There was no sign of significant endosonographic abnormality in the       common bile duct (5.9 mm) disease and in the common hepatic duct 6 point       millimeter entheses.      Endosonographic imaging of the ampulla showed no intramural       (subepithelial) lesion.      Endosonographic imaging in the visualized portion of the liver showed no       mass.      No malignant-appearing lymph nodes were visualized in the celiac region       (level 20), peripancreatic region and porta hepatis region.      The celiac region was visualized. Impression:               EGD impression:                           - Obvious mass?lesion noted to fold/clavicular                            cartilage.                           - No gross lesions in the entire esophagus. Z-line                            irregular, 40 cm from the incisors.                           -  4 cm hiatal hernia.                           - Non-bleeding gastric ulcers with a clean ulcer                            base (Forrest Class III) in the antrum. Moderate                            gastritis. Biopsied.                           - No gross lesions in the duodenal bulb.                           - Congested  duodenal mucosa at the D1/D2 angle and                            in D2. Biopsied. Query changes of potential groove                            pancreatitis or previous group pancreatitis.                           - Congested major papilla.                           EUS impression:                           - Pancreatic parenchymal abnormalities consisting                            of hyperechoic foci, lobularity and hyperechoic                            strands were noted in the pancreatic head, genu of                            the pancreas, pancreatic body and pancreatic tail.                           - Main pancreatic duct (MPD) diameter was measured.                            Endosonographically, the MPD had a dilated                            appearance.                           - Two stones were visualized endosonographically in                            the gallbladder.                           -  There was no sign of significant pathology in the                            common bile duct and in the common hepatic duct.                            Slightly prominent but no evidence of                            choledocholithiasis or overt biliary dilation noted                            at this time.                           - No malignant-appearing lymph nodes were                            visualized in the celiac region (level 20),                            peripancreatic region and porta hepatis region. Moderate Sedation:      Not Applicable - Patient had care per Anesthesia. Recommendation:           - The patient will be observed post-procedure,                            until all discharge criteria are met.                           - Discharge patient to home.                           - Patient has a contact number available for                            emergencies. The signs and symptoms of potential                            delayed complications were  discussed with the                            patient. Return to normal activities tomorrow.                            Written discharge instructions were provided to the                            patient.                           - Resume previous diet.                           - Observe patient's clinical course.                           -  Await path results.                           - Initiate omeprazole 40 mg twice daily.                           - Repeat EGD to be considered in 4 months to ensure                            healing of gastric ulcers and gastritis.                           - Referral to ENT for evaluation of oropharyngeal                            masslike lesion - query malignancy.                           - Follow-up with oncology as already scheduled.                           - Consider repeat MRI/MRCP in 1 year to follow-up                            biliary and pancreatic dilatation (as previously                            documented there is dilation going back a few years                            based on the most recent imaging/MRI/MRCP                            documentation).                           - The findings and recommendations were discussed                            with the patient.                           - The findings and recommendations were discussed                            with the patient's family. Procedure Code(s):        --- Professional ---                           [phone], Esophagogastroduodenoscopy, flexible,                            transoral; with endoscopic ultrasound examination                            limited to the  esophagus, stomach or duodenum, and                            adjacent structures                           43239, Esophagogastroduodenoscopy, flexible,                            transoral; with biopsy, single or multiple Diagnosis Code(s):        --- Professional ---                            K22.89, Other specified disease of esophagus                           K44.9, Diaphragmatic hernia without obstruction or                            gangrene                           K25.9, Gastric ulcer, unspecified as acute or                            chronic, without hemorrhage or perforation                           K29.70, Gastritis, unspecified, without bleeding                           K31.89, Other diseases of stomach and duodenum                           K86.9, Disease of pancreas, unspecified                           K80.20, Calculus of gallbladder without                            cholecystitis without obstruction                           I89.9, Noninfective disorder of lymphatic vessels                            and lymph nodes, unspecified                           K86.89, Other specified diseases of pancreas                           R93.3, Abnormal findings on diagnostic imaging of                            other parts of digestive tract  K83.8, Other specified diseases of biliary tract CPT copyright 2022 [employer]. All rights reserved. The codes documented in this report are preliminary and upon coder review may  be revised to meet current compliance requirements. Corliss Parish, MD 05/15/2023 11:56:11 AM Number of Addenda: 0

## 2023-05-15 NOTE — Discharge Instructions (Signed)
YOU HAD AN ENDOSCOPIC PROCEDURE TODAY: Refer to the procedure report and other information in the discharge instructions given to you for any specific questions about what was found during the examination. If this information does not answer your questions, please call [place] office at [phone] to clarify.   YOU SHOULD EXPECT: Some feelings of bloating in the abdomen. Passage of more gas than usual. Walking can help get rid of the air that was put into your GI tract during the procedure and reduce the bloating. If you had a lower endoscopy (such as a colonoscopy or flexible sigmoidoscopy) you may notice spotting of blood in your stool or on the toilet paper. Some abdominal soreness may be present for a day or two, also.  DIET: Your first meal following the procedure should be a light meal and then it is ok to progress to your normal diet. A half-sandwich or bowl of soup is an example of a good first meal. Heavy or fried foods are harder to digest and may make you feel nauseous or bloated. Drink plenty of fluids but you should avoid alcoholic beverages for 24 hours    ACTIVITY: Your care partner should take you home directly after the procedure. You should plan to take it easy, moving slowly for the rest of the day. You can resume normal activity the day after the procedure however YOU SHOULD NOT DRIVE, use power tools, machinery or perform tasks that involve climbing or major physical exertion for 24 hours (because of the sedation medicines used during the test).   SYMPTOMS TO REPORT IMMEDIATELY: A gastroenterologist can be reached at any hour. Please call [phone]  for any of the following symptoms:    Following upper endoscopy (EGD, EUS, ERCP, esophageal dilation) Vomiting of blood or coffee ground material  New, significant abdominal pain  New, significant chest pain or pain under the shoulder blades  Painful or persistently difficult swallowing  New shortness of breath  Black,  tarry-looking or red, bloody stools  FOLLOW UP:  If any biopsies were taken you will be contacted by phone or by letter within the next 1-3 weeks. Call [phone]  if you have not heard about the biopsies in 3 weeks.  Please also call with any specific questions about appointments or follow up tests.

## 2023-05-15 NOTE — Transfer of Care (Signed)
Immediate Anesthesia Transfer of Care Note  Patient: Bryan Fleming  Procedure(s) Performed: ESOPHAGOGASTRODUODENOSCOPY (EGD) WITH PROPOFOL UPPER ENDOSCOPIC ULTRASOUND (EUS) RADIAL BIOPSY  Patient Location: PACU  Anesthesia Type:MAC  Level of Consciousness: sedated  Airway & Oxygen Therapy: Patient Spontanous Breathing and Patient connected to face mask oxygen  Post-op Assessment: Report given to RN and Post -op Vital signs reviewed and stable  Post vital signs: Reviewed and stable  Last Vitals:  Vitals Value Taken Time  BP    Temp    Pulse    Resp    SpO2      Last Pain:  Vitals:   05/15/23 1049  TempSrc: Temporal  PainSc: 0-No pain         Complications: No notable events documented.

## 2023-05-15 NOTE — Anesthesia Preprocedure Evaluation (Addendum)
Anesthesia Evaluation  Patient identified by MRN, date of birth, ID band Patient awake    Reviewed: Allergy & Precautions, NPO status , Patient's Chart, lab work & pertinent test results  Airway Mallampati: II  TM Distance: >3 FB Neck ROM: Full    Dental no notable dental hx. (+) Teeth Intact, Dental Advisory Given   Pulmonary COPD, Current Smoker and Patient abstained from smoking. Lung CA   Pulmonary exam normal breath sounds clear to auscultation       Cardiovascular hypertension, Pt. on medications Normal cardiovascular exam Rhythm:Regular Rate:Normal  TTE 2023 1. Left ventricular ejection fraction, by estimation, is 60 to 65%. The  left ventricle has normal function. The left ventricle has no regional  wall motion abnormalities. There is mild left ventricular hypertrophy.  Left ventricular diastolic parameters  are consistent with Grade I diastolic dysfunction (impaired relaxation).   2. Right ventricular systolic function is normal. The right ventricular  size is normal.   3. The mitral valve is normal in structure. No evidence of mitral valve  regurgitation. No evidence of mitral stenosis.   4. The aortic valve is tricuspid. There is mild calcification of the  aortic valve. There is mild thickening of the aortic valve. Aortic valve  regurgitation is mild. No aortic stenosis is present.   5. The inferior vena cava is normal in size with greater than 50%  respiratory variability, suggesting right atrial pressure of 3 mmHg.     Neuro/Psych  PSYCHIATRIC DISORDERS Anxiety Depression    negative neurological ROS     GI/Hepatic ,GERD  ,,(+)     substance abuse  alcohol use, Hepatitis -  Endo/Other  negative endocrine ROS    Renal/GU negative Renal ROS  negative genitourinary   Musculoskeletal negative musculoskeletal ROS (+)    Abdominal   Peds  Hematology negative hematology ROS (+)   Anesthesia Other  Findings   Reproductive/Obstetrics                             Anesthesia Physical Anesthesia Plan  ASA: 3  Anesthesia Plan: MAC   Post-op Pain Management:    Induction: Intravenous  PONV Risk Score and Plan: Propofol infusion and Treatment may vary due to age or medical condition  Airway Management Planned: Natural Airway  Additional Equipment:   Intra-op Plan:   Post-operative Plan:   Informed Consent: I have reviewed the patients History and Physical, chart, labs and discussed the procedure including the risks, benefits and alternatives for the proposed anesthesia with the patient or authorized representative who has indicated his/her understanding and acceptance.     Dental advisory given  Plan Discussed with: CRNA  Anesthesia Plan Comments:        Anesthesia Quick Evaluation

## 2023-05-15 NOTE — H&P (Signed)
GASTROENTEROLOGY PROCEDURE H&P NOTE   Primary Care Physician: Benetta Spar, MD  HPI: Bryan Fleming is a 62 y.o. male who presents for EGD/EUS to evaluate the ampulla and abnormal imaging concerning for possible ampullary process.  Past Medical History:  Diagnosis Date   Cancer [facility])    Lung cancer   COPD (chronic obstructive pulmonary disease) (HCC) 2007   EMPHYSEMA   Depression    Dyspnea    occasional - no oxygen   GERD (gastroesophageal reflux disease) 2010   Hepatitis    Alcoholic    HLD (hyperlipidemia)    no meds   Hypertension    Port-A-Cath in place 01/01/2021   Past Surgical History:  Procedure Laterality Date   BRONCHIAL BIOPSY  12/23/2020   Procedure: BRONCHIAL BIOPSIES;  Surgeon: Leslye Peer, MD;  Location: [facility] ENDOSCOPY;  Service: Pulmonary;;   BRONCHIAL BRUSHINGS  12/23/2020   Procedure: BRONCHIAL BRUSHINGS;  Surgeon: Leslye Peer, MD;  Location: [facility] ENDOSCOPY;  Service: Pulmonary;;   BRONCHIAL NEEDLE ASPIRATION BIOPSY  12/23/2020   Procedure: BRONCHIAL NEEDLE ASPIRATION BIOPSIES;  Surgeon: Leslye Peer, MD;  Location[facility] ENDOSCOPY;  Service: Pulmonary;;   BRONCHIAL WASHINGS  12/23/2020   Procedure: BRONCHIAL WASHINGS;  Surgeon: Leslye Peer, MD;  Location: [facility] ENDOSCOPY;  Service: Pulmonary;;   CARDIAC CATHETERIZATION  2010   COLONOSCOPY  03/14/2012   Procedure: COLONOSCOPY;  Surgeon: Malissa Hippo, MD;  Location: [facility] ENDO SUITE;  Service: Endoscopy;  Laterality: N/A;  100   INTERCOSTAL NERVE BLOCK  04/21/2021   Procedure: INTERCOSTAL NERVE BLOCK;  Surgeon: Loreli Slot, MD;  Location: [facility] OR;  Service: Thoracic;;   IR IMAGING GUIDED PORT INSERTION  01/12/2021   LUNG REMOVAL, PARTIAL  08/2021   PENILE PROSTHESIS IMPLANT N/A 02/17/2022   Procedure: PENILE PROTHESIS INFLATABLE;  Surgeon: [name], MD;  Location: [facility] ORS;  Service: Urology;  Laterality: N/A;   PORTACATH PLACEMENT  07/2021   REMOVAL OF PENILE  PROSTHESIS N/A 10/13/2022   Procedure: REMOVAL OF PENILE PROSTHESIS;  Surgeon: [name], MD;  Location: [facility] ORS;  Service: Urology;  Laterality: N/A;   REMOVAL OF PENILE PROSTHESIS N/A 12/05/2022   Procedure: REMOVAL AND REPLACEMENT OF PENILE PROSTHESIS;  Surgeon: [name], MD;  Location: [facility] ORS;  Service: Urology;  Laterality: N/A;  120 MINUTES NEEDED FOR CASE   VIDEO ASSISTED THORACOSCOPY (VATS)/ LOBECTOMY Left 04/21/2021   Procedure: VIDEO ASSISTED THORACOSCOPY (VATS)/ LOBECTOMY;  Surgeon: Loreli Slot, MD;  Location: [facility] OR;  Service: Thoracic;  Laterality: Left;   VIDEO BRONCHOSCOPY WITH ENDOBRONCHIAL NAVIGATION N/A 12/23/2020   Procedure: VIDEO BRONCHOSCOPY WITH ENDOBRONCHIAL NAVIGATION;  Surgeon: Leslye Peer, MD;  Location: [facility] ENDOSCOPY;  Service: Pulmonary;  Laterality: N/A;   WRIST GANGLION EXCISION  1985   No current facility-administered medications for this encounter.   No current facility-administered medications for this encounter. Allergies  Allergen Reactions   Other Nausea Only and Other (See Comments)    No spicy foods- Patient cannot tolerate- HEARTBURN   Family History  Problem Relation Age of Onset   Hypertension Mother    Asthma Father    Diabetes Sister    Hypertension Sister    Hypertension Brother    Hypertension Sister    Lupus Daughter    Social History   Socioeconomic History   Marital status: Married    Spouse name: Not on file   Number of children: Not on file   Years of education:  Not on file   Highest education level: Not on file  Occupational History   Not on file  Tobacco Use   Smoking status: Every Day    Current packs/day: 1.00    Average packs/day: 1 pack/day for 37.0 years (37.0 ttl pk-yrs)    Types: Cigarettes   Smokeless tobacco: Never   Tobacco comments:    smokes 1/2 pack a day [facility] 03/08/21  Vaping Use   Vaping status: Never Used  Substance and Sexual Activity   Alcohol use: Yes    Alcohol/week: 56.0  standard drinks of alcohol    Types: 35 Cans of beer, 21 Shots of liquor per week    Comment: 5-6 beers and 2-3 shots daily-04/19/21   Drug use: No   Sexual activity: Yes  Other Topics Concern   Not on file  Social History Narrative   Not on file   Social Determinants of Health   Financial Resource Strain: Not on file  Food Insecurity: No Food Insecurity (12/01/2022)   Hunger Vital Sign    Worried About Running Out of Food in the Last Year: Never true    Ran Out of Food in the Last Year: Never true  Transportation Needs: No Transportation Needs (12/01/2022)   PRAPARE - Administrator, Civil Service (Medical): No    Lack of Transportation (Non-Medical): No  Physical Activity: Inactive (12/03/2020)   Exercise Vital Sign    Days of Exercise per Week: 0 days    Minutes of Exercise per Session: 0 min  Stress: Not on file  Social Connections: Not on file  Intimate Partner Violence: Not At Risk (12/01/2022)   Humiliation, Afraid, Rape, and Kick questionnaire    Fear of Current or Ex-Partner: No    Emotionally Abused: No    Physically Abused: No    Sexually Abused: No    Physical Exam: There were no vitals filed for this visit. There is no height or weight on file to calculate BMI. GEN: NAD EYE: Sclerae anicteric ENT: MMM CV: Non-tachycardic GI: Soft, NT/ND NEURO:  Alert & Oriented x 3  Lab Results: No results for input(s): "WBC", "HGB", "HCT", "PLT" in the last 72 hours. BMET No results for input(s): "NA", "K", "CL", "CO2", "GLUCOSE", "BUN", "CREATININE", "CALCIUM" in the last 72 hours. LFT No results for input(s): "PROT", "ALBUMIN", "AST", "ALT", "ALKPHOS", "BILITOT", "BILIDIR", "IBILI" in the last 72 hours. PT/INR No results for input(s): "LABPROT", "INR" in the last 72 hours.   Impression / Plan: This is a 62 y.o.male who presents for EGD/EUS to evaluate the ampulla and abnormal imaging concerning for possible ampullary process.  The risks of an EUS  including intestinal perforation, bleeding, infection, aspiration, and medication effects were discussed as was the possibility it may not give a definitive diagnosis if a biopsy is performed.  When a biopsy of the pancreas is done as part of the EUS, there is an additional risk of pancreatitis at the rate of about 1-2%.  It was explained that procedure related pancreatitis is typically mild, although it can be severe and even life threatening, which is why we do not perform random pancreatic biopsies and only biopsy a lesion/area we feel is concerning enough to warrant the risk.   The risks and benefits of endoscopic evaluation/treatment were discussed with the patient and/or family; these include but are not limited to the risk of perforation, infection, bleeding, missed lesions, lack of diagnosis, severe illness requiring hospitalization, as well as anesthesia and sedation  related illnesses.  The patient's history has been reviewed, patient examined, no change in status, and deemed stable for procedure.  The patient and/or family is agreeable to proceed.    Corliss Parish, MD  Gastroenterology Advanced Endoscopy Office # [phone]

## 2023-05-16 ENCOUNTER — Encounter: Payer: Self-pay | Admitting: Gastroenterology

## 2023-05-16 ENCOUNTER — Telehealth: Payer: Self-pay

## 2023-05-16 ENCOUNTER — Other Ambulatory Visit: Payer: Self-pay

## 2023-05-16 ENCOUNTER — Encounter (HOSPITAL_COMMUNITY): Payer: Self-pay | Admitting: Gastroenterology

## 2023-05-16 DIAGNOSIS — J392 Other diseases of pharynx: Secondary | ICD-10-CM

## 2023-05-16 LAB — SURGICAL PATHOLOGY

## 2023-05-16 NOTE — Telephone Encounter (Signed)
Thanks for update. GM 

## 2023-05-16 NOTE — Telephone Encounter (Signed)
-----   Message from [facility] sent at 05/15/2023 12:21 PM EDT ----- Regarding: ENT Referral Bryan Fleming, This patient needs an urgent ENT referral for an oropharyngeal mass.  Concern underlying malignancy.  Any ENT in [place] area. Patient also needs MRI/MRCP in 1 year follow-up of PD dilation.  FYI SK.  In case you have any ins with any of the ENT providers. Thanks. GM

## 2023-05-16 NOTE — Telephone Encounter (Signed)
Urgent referral has been made.

## 2023-05-17 NOTE — Anesthesia Postprocedure Evaluation (Signed)
Anesthesia Post Note  Patient: Bryan Fleming  Procedure(s) Performed: ESOPHAGOGASTRODUODENOSCOPY (EGD) WITH PROPOFOL UPPER ENDOSCOPIC ULTRASOUND (EUS) RADIAL BIOPSY     Patient location during evaluation: Endoscopy Anesthesia Type: MAC Level of consciousness: awake and alert Pain management: pain level controlled Vital Signs Assessment: post-procedure vital signs reviewed and stable Respiratory status: spontaneous breathing, nonlabored ventilation, respiratory function stable and patient connected to nasal cannula oxygen Cardiovascular status: blood pressure returned to baseline and stable Postop Assessment: no apparent nausea or vomiting Anesthetic complications: no   No notable events documented.  Last Vitals:  Vitals:   05/15/23 1230 05/15/23 1240  BP: (!) 141/104 (!) 161/68  Pulse: 98 95  Resp: (!) 8 19  Temp:    SpO2: 92% 90%    Last Pain:  Vitals:   05/15/23 1150  TempSrc:   PainSc: 0-No pain                 Ohm Dentler L Ramiyah Mcclenahan

## 2023-05-19 DIAGNOSIS — K2980 Duodenitis without bleeding: Secondary | ICD-10-CM | POA: Diagnosis not present

## 2023-05-19 DIAGNOSIS — C109 Malignant neoplasm of oropharynx, unspecified: Secondary | ICD-10-CM | POA: Diagnosis not present

## 2023-06-14 DIAGNOSIS — F17210 Nicotine dependence, cigarettes, uncomplicated: Secondary | ICD-10-CM | POA: Diagnosis not present

## 2023-06-14 DIAGNOSIS — J387 Other diseases of larynx: Secondary | ICD-10-CM | POA: Diagnosis not present

## 2023-06-14 DIAGNOSIS — J392 Other diseases of pharynx: Secondary | ICD-10-CM | POA: Diagnosis not present

## 2023-06-15 ENCOUNTER — Other Ambulatory Visit (HOSPITAL_COMMUNITY): Payer: Self-pay | Admitting: Otolaryngology

## 2023-06-15 ENCOUNTER — Inpatient Hospital Stay: Payer: Medicare HMO | Attending: Hematology

## 2023-06-15 DIAGNOSIS — J392 Other diseases of pharynx: Secondary | ICD-10-CM

## 2023-06-19 ENCOUNTER — Ambulatory Visit (HOSPITAL_COMMUNITY)
Admission: RE | Admit: 2023-06-19 | Discharge: 2023-06-19 | Disposition: A | Discharge status: Home / Self Care | Payer: Medicare HMO | Source: Ambulatory Visit | Attending: Otolaryngology | Admitting: Otolaryngology

## 2023-06-19 DIAGNOSIS — I10 Essential (primary) hypertension: Secondary | ICD-10-CM | POA: Diagnosis not present

## 2023-06-19 DIAGNOSIS — K219 Gastro-esophageal reflux disease without esophagitis: Secondary | ICD-10-CM | POA: Diagnosis not present

## 2023-06-19 DIAGNOSIS — J392 Other diseases of pharynx: Secondary | ICD-10-CM | POA: Diagnosis not present

## 2023-06-19 DIAGNOSIS — R918 Other nonspecific abnormal finding of lung field: Secondary | ICD-10-CM | POA: Diagnosis not present

## 2023-06-19 DIAGNOSIS — I6523 Occlusion and stenosis of bilateral carotid arteries: Secondary | ICD-10-CM | POA: Diagnosis not present

## 2023-06-19 MED ORDER — IOHEXOL 300 MG/ML  SOLN
75.0000 mL | Freq: Once | INTRAMUSCULAR | Status: AC | PRN
  Administered 2023-06-19: 75 mL via INTRAVENOUS

## 2023-06-20 ENCOUNTER — Other Ambulatory Visit: Payer: Self-pay | Admitting: Otolaryngology

## 2023-06-27 ENCOUNTER — Encounter (HOSPITAL_BASED_OUTPATIENT_CLINIC_OR_DEPARTMENT_OTHER): Payer: Self-pay | Admitting: Otolaryngology

## 2023-06-27 ENCOUNTER — Other Ambulatory Visit: Payer: Self-pay

## 2023-06-27 NOTE — Progress Notes (Signed)
   06/27/23 1347  PAT Phone Screen  Is the patient taking a GLP-1 receptor agonist? No  Do You Have Diabetes? No  Do You Have Hypertension? (S)  Yes  Have You Ever Been to the ER for Asthma? No  Have You Taken Oral Steroids in the Past 3 Months? No  Do you Take Phenteramine or any Other Diet Drugs? No  Recent  Lab Work, EKG, CXR? (S)  Yes  Where was this test performed? (S)  EKG 12/19/22 ST  Do you have a history of heart problems? No  Any Recent Hospitalizations? No  Height 5\' 9"  (1.753 m)  Weight 64 kg  Pat Appointment Scheduled No  Reason for No Appointment Not Needed

## 2023-06-28 NOTE — Progress Notes (Signed)
Notified patient that he needs to stop by [facility] between 7am-3pm for pre-surgical drink. Contact number included for any questions.

## 2023-07-04 ENCOUNTER — Ambulatory Visit (HOSPITAL_BASED_OUTPATIENT_CLINIC_OR_DEPARTMENT_OTHER): Payer: Medicare HMO | Admitting: Anesthesiology

## 2023-07-04 ENCOUNTER — Encounter (HOSPITAL_BASED_OUTPATIENT_CLINIC_OR_DEPARTMENT_OTHER)
Admission: RE | Disposition: A | Discharge status: Home / Self Care | Payer: Self-pay | Source: Home / Self Care | Attending: Otolaryngology

## 2023-07-04 ENCOUNTER — Encounter (HOSPITAL_BASED_OUTPATIENT_CLINIC_OR_DEPARTMENT_OTHER): Payer: Self-pay | Admitting: Otolaryngology

## 2023-07-04 ENCOUNTER — Ambulatory Visit (HOSPITAL_BASED_OUTPATIENT_CLINIC_OR_DEPARTMENT_OTHER)
Admission: RE | Admit: 2023-07-04 | Discharge: 2023-07-04 | Disposition: A | Discharge status: Home / Self Care | Payer: Medicare HMO | Attending: Otolaryngology | Admitting: Otolaryngology

## 2023-07-04 ENCOUNTER — Other Ambulatory Visit: Payer: Self-pay

## 2023-07-04 DIAGNOSIS — J439 Emphysema, unspecified: Secondary | ICD-10-CM | POA: Diagnosis not present

## 2023-07-04 DIAGNOSIS — C3412 Malignant neoplasm of upper lobe, left bronchus or lung: Secondary | ICD-10-CM | POA: Insufficient documentation

## 2023-07-04 DIAGNOSIS — C321 Malignant neoplasm of supraglottis: Secondary | ICD-10-CM | POA: Diagnosis not present

## 2023-07-04 DIAGNOSIS — C130 Malignant neoplasm of postcricoid region: Secondary | ICD-10-CM | POA: Diagnosis not present

## 2023-07-04 DIAGNOSIS — J449 Chronic obstructive pulmonary disease, unspecified: Secondary | ICD-10-CM | POA: Insufficient documentation

## 2023-07-04 DIAGNOSIS — I10 Essential (primary) hypertension: Secondary | ICD-10-CM | POA: Diagnosis not present

## 2023-07-04 DIAGNOSIS — K219 Gastro-esophageal reflux disease without esophagitis: Secondary | ICD-10-CM | POA: Diagnosis not present

## 2023-07-04 DIAGNOSIS — Z9221 Personal history of antineoplastic chemotherapy: Secondary | ICD-10-CM | POA: Insufficient documentation

## 2023-07-04 DIAGNOSIS — F17210 Nicotine dependence, cigarettes, uncomplicated: Secondary | ICD-10-CM | POA: Diagnosis not present

## 2023-07-04 DIAGNOSIS — C099 Malignant neoplasm of tonsil, unspecified: Secondary | ICD-10-CM | POA: Diagnosis not present

## 2023-07-04 DIAGNOSIS — C059 Malignant neoplasm of palate, unspecified: Secondary | ICD-10-CM | POA: Diagnosis not present

## 2023-07-04 DIAGNOSIS — I700 Atherosclerosis of aorta: Secondary | ICD-10-CM | POA: Insufficient documentation

## 2023-07-04 DIAGNOSIS — C7839 Secondary malignant neoplasm of other respiratory organs: Secondary | ICD-10-CM | POA: Diagnosis not present

## 2023-07-04 DIAGNOSIS — C7989 Secondary malignant neoplasm of other specified sites: Secondary | ICD-10-CM | POA: Diagnosis not present

## 2023-07-04 DIAGNOSIS — J387 Other diseases of larynx: Secondary | ICD-10-CM

## 2023-07-04 DIAGNOSIS — J392 Other diseases of pharynx: Secondary | ICD-10-CM

## 2023-07-04 DIAGNOSIS — C051 Malignant neoplasm of soft palate: Secondary | ICD-10-CM | POA: Diagnosis not present

## 2023-07-04 DIAGNOSIS — C320 Malignant neoplasm of glottis: Secondary | ICD-10-CM | POA: Diagnosis not present

## 2023-07-04 DIAGNOSIS — Z7951 Long term (current) use of inhaled steroids: Secondary | ICD-10-CM | POA: Diagnosis not present

## 2023-07-04 DIAGNOSIS — F17200 Nicotine dependence, unspecified, uncomplicated: Secondary | ICD-10-CM | POA: Insufficient documentation

## 2023-07-04 DIAGNOSIS — C090 Malignant neoplasm of tonsillar fossa: Secondary | ICD-10-CM | POA: Diagnosis not present

## 2023-07-04 HISTORY — PX: LARYNGOSCOPY: SHX5203

## 2023-07-04 SURGERY — LARYNGOSCOPY
Anesthesia: General | Site: Throat

## 2023-07-04 MED ORDER — PROPOFOL 10 MG/ML IV BOLUS
INTRAVENOUS | Status: AC
  Filled 2023-07-04: qty 20

## 2023-07-04 MED ORDER — LIDOCAINE-EPINEPHRINE 1 %-1:100000 IJ SOLN
INTRAMUSCULAR | Status: AC
Start: 2023-07-04 — End: ?
  Filled 2023-07-04: qty 1

## 2023-07-04 MED ORDER — EPINEPHRINE HCL (NASAL) 0.1 % NA SOLN
NASAL | Status: AC
  Filled 2023-07-04: qty 10

## 2023-07-04 MED ORDER — LIDOCAINE 2% (20 MG/ML) 5 ML SYRINGE
INTRAMUSCULAR | Status: DC | PRN
  Administered 2023-07-04: 100 mg via INTRAVENOUS

## 2023-07-04 MED ORDER — DEXAMETHASONE SODIUM PHOSPHATE 10 MG/ML IJ SOLN
INTRAMUSCULAR | Status: AC
  Filled 2023-07-04: qty 1

## 2023-07-04 MED ORDER — HYDROCODONE-ACETAMINOPHEN 5-325 MG PO TABS: 1.0000 | ORAL_TABLET | ORAL | 0 refills | Status: AC | PRN

## 2023-07-04 MED ORDER — EPINEPHRINE PF 1 MG/ML IJ SOLN
INTRAMUSCULAR | Status: DC | PRN
  Administered 2023-07-04: 4 mL

## 2023-07-04 MED ORDER — OXYCODONE HCL 5 MG PO TABS
ORAL_TABLET | ORAL | Status: AC
  Filled 2023-07-04: qty 1

## 2023-07-04 MED ORDER — DEXAMETHASONE SODIUM PHOSPHATE 10 MG/ML IJ SOLN
INTRAMUSCULAR | Status: DC | PRN
  Administered 2023-07-04: 10 mg via INTRAVENOUS

## 2023-07-04 MED ORDER — LIDOCAINE 2% (20 MG/ML) 5 ML SYRINGE
INTRAMUSCULAR | Status: AC
  Filled 2023-07-04: qty 5

## 2023-07-04 MED ORDER — OXYCODONE HCL 5 MG PO TABS
5.0000 mg | ORAL_TABLET | Freq: Once | ORAL | Status: AC | PRN
  Administered 2023-07-04: 5 mg via ORAL

## 2023-07-04 MED ORDER — ROCURONIUM BROMIDE 10 MG/ML (PF) SYRINGE
PREFILLED_SYRINGE | INTRAVENOUS | Status: DC | PRN
  Administered 2023-07-04: 50 mg via INTRAVENOUS

## 2023-07-04 MED ORDER — LACTATED RINGERS IV SOLN: INTRAVENOUS | Status: DC

## 2023-07-04 MED ORDER — FENTANYL CITRATE (PF) 100 MCG/2ML IJ SOLN
INTRAMUSCULAR | Status: AC
  Filled 2023-07-04: qty 2

## 2023-07-04 MED ORDER — ONDANSETRON HCL 4 MG/2ML IJ SOLN
INTRAMUSCULAR | Status: AC
  Filled 2023-07-04: qty 2

## 2023-07-04 MED ORDER — FENTANYL CITRATE (PF) 250 MCG/5ML IJ SOLN
INTRAMUSCULAR | Status: DC | PRN
  Administered 2023-07-04: 100 ug via INTRAVENOUS
  Administered 2023-07-04: 50 ug via INTRAVENOUS

## 2023-07-04 MED ORDER — EPINEPHRINE PF 1 MG/ML IJ SOLN
INTRAMUSCULAR | Status: AC
  Filled 2023-07-04: qty 2

## 2023-07-04 MED ORDER — OXYCODONE HCL 5 MG/5ML PO SOLN: 5.0000 mg | Freq: Once | ORAL | Status: AC | PRN

## 2023-07-04 MED ORDER — ACETAMINOPHEN 10 MG/ML IV SOLN: 1000.0000 mg | Freq: Once | INTRAVENOUS | Status: DC | PRN

## 2023-07-04 MED ORDER — ACETAMINOPHEN 325 MG PO TABS: 325.0000 mg | ORAL_TABLET | ORAL | Status: DC | PRN

## 2023-07-04 MED ORDER — ROCURONIUM BROMIDE 10 MG/ML (PF) SYRINGE
PREFILLED_SYRINGE | INTRAVENOUS | Status: AC
  Filled 2023-07-04: qty 10

## 2023-07-04 MED ORDER — PROPOFOL 10 MG/ML IV BOLUS
INTRAVENOUS | Status: DC | PRN
  Administered 2023-07-04: 200 mg via INTRAVENOUS

## 2023-07-04 MED ORDER — ACETAMINOPHEN 160 MG/5ML PO SOLN: 325.0000 mg | ORAL | Status: DC | PRN

## 2023-07-04 MED ORDER — ONDANSETRON HCL 4 MG/2ML IJ SOLN: 4.0000 mg | Freq: Once | INTRAMUSCULAR | Status: DC | PRN

## 2023-07-04 MED ORDER — SUGAMMADEX SODIUM 200 MG/2ML IV SOLN
INTRAVENOUS | Status: DC | PRN
  Administered 2023-07-04: 200 mg via INTRAVENOUS

## 2023-07-04 MED ORDER — ONDANSETRON HCL 4 MG/2ML IJ SOLN
INTRAMUSCULAR | Status: DC | PRN
  Administered 2023-07-04: 4 mg via INTRAVENOUS

## 2023-07-04 MED ORDER — OXYMETAZOLINE HCL 0.05 % NA SOLN
NASAL | Status: AC
  Filled 2023-07-04: qty 30

## 2023-07-04 MED ORDER — DEXMEDETOMIDINE HCL IN NACL 80 MCG/20ML IV SOLN
INTRAVENOUS | Status: DC | PRN
  Administered 2023-07-04 (×2): 8 ug via INTRAVENOUS

## 2023-07-04 MED ORDER — FENTANYL CITRATE (PF) 100 MCG/2ML IJ SOLN: 25.0000 ug | INTRAMUSCULAR | Status: DC | PRN

## 2023-07-04 MED ORDER — LACTATED RINGERS IV SOLN: INTRAVENOUS | Status: DC | PRN

## 2023-07-04 SURGICAL SUPPLY — 27 items
ANTIFOG SOL W/FOAM PAD STRL (MISCELLANEOUS) ×1
CANISTER SUCT 1200ML W/VALVE (MISCELLANEOUS) ×1 IMPLANT
CNTNR URN SCR LID CUP LEK RST (MISCELLANEOUS) IMPLANT
COAGULATOR SUCT SWTCH 10FR 6 (ELECTROSURGICAL) IMPLANT
ELECT REM PT RETURN 9FT ADLT (ELECTROSURGICAL) ×1
ELECTRODE REM PT RTRN 9FT ADLT (ELECTROSURGICAL) IMPLANT
GAUZE SPONGE 4X4 12PLY STRL LF (GAUZE/BANDAGES/DRESSINGS) ×1 IMPLANT
GLOVE BIO SURGEON STRL SZ 6.5 (GLOVE) ×1 IMPLANT
GLOVE BIOGEL PI IND STRL 7.0 (GLOVE) IMPLANT
GOWN STRL REUS W/ TWL LRG LVL3 (GOWN DISPOSABLE) ×2 IMPLANT
GUARD TEETH (MISCELLANEOUS) ×1 IMPLANT
MARKER SKIN DUAL TIP RULER LAB (MISCELLANEOUS) ×1 IMPLANT
NDL SAFETY ECLIPSE 18X1.5 (NEEDLE) IMPLANT
NDL SPNL 22GX7 QUINCKE BK (NEEDLE) IMPLANT
NDL SPNL 25GX3.5 QUINCKE BL (NEEDLE) IMPLANT
NEEDLE SPNL 22GX7 QUINCKE BK (NEEDLE)
NEEDLE SPNL 25GX3.5 QUINCKE BL (NEEDLE)
NS IRRIG 1000ML POUR BTL (IV SOLUTION) ×1 IMPLANT
PACK BASIN DAY SURGERY FS (CUSTOM PROCEDURE TRAY) ×1 IMPLANT
PATTIES SURGICAL .5 X3 (DISPOSABLE) ×1 IMPLANT
SHEET MEDIUM DRAPE 40X70 STRL (DRAPES) ×1 IMPLANT
SLEEVE SCD COMPRESS KNEE MED (STOCKING) ×1 IMPLANT
SOLUTION ANTFG W/FOAM PAD STRL (MISCELLANEOUS) ×1 IMPLANT
SYR CONTROL 10ML LL (SYRINGE) IMPLANT
SYR TB 1ML LL NO SAFETY (SYRINGE) IMPLANT
TOWEL GREEN STERILE FF (TOWEL DISPOSABLE) ×1 IMPLANT
TUBE CONNECTING 20X1/4 (TUBING) ×1 IMPLANT

## 2023-07-04 NOTE — Transfer of Care (Signed)
Immediate Anesthesia Transfer of Care Note  Patient: Bryan Fleming  Procedure(s) Performed: LARYNGOSCOPY WITH BIOPSY (Throat)  Patient Location: PACU  Anesthesia Type:General  Level of Consciousness: awake, alert , and oriented  Airway & Oxygen Therapy: Patient Spontanous Breathing and Patient connected to face mask oxygen  Post-op Assessment: Report given to RN and Post -op Vital signs reviewed and stable  Post vital signs: Reviewed and stable  Last Vitals:  Vitals Value Taken Time  BP 186/114 07/04/23 1445  Temp    Pulse 114 07/04/23 1446  Resp 19 07/04/23 1446  SpO2 99 % 07/04/23 1446  Vitals shown include unfiled device data.  Last Pain:  Vitals:   07/04/23 1224  TempSrc: Temporal  PainSc: 0-No pain         Complications: No notable events documented.

## 2023-07-04 NOTE — Anesthesia Postprocedure Evaluation (Deleted)
Anesthesia Post Note  Patient: Bryan Fleming  Procedure(s) Performed: LARYNGOSCOPY WITH BIOPSIES (Throat)     Anesthesia Type: General Anesthetic complications: no   There were no known notable events for this encounter.  Last Vitals:  Vitals:   07/04/23 1500 07/04/23 1529  BP: (!) 184/83 (!) 172/92  Pulse: (!) 102 98  Resp: 15 16  Temp:  37.2 C  SpO2: 95% 94%    Last Pain:  Vitals:   07/04/23 1529  TempSrc: Temporal  PainSc: 8                  [name] [address]

## 2023-07-04 NOTE — H&P (Signed)
Bryan Fleming is an 62 y.o. male.    Chief Complaint:  Mass of oropharynx and supraglottis  HPI: Patient presents today for planned elective procedure.  He denies any interval change in history since office visit on 06/14/2023:  Bryan Fleming is a 62 y.o. male who presents as a new consult, referred by Bryan Parish, MD, for evaluation and treatment of a throat mass which was noted incidentally on recent upper endoscopy.   He reports mild difficulty in swallowing and a slight change in his voice, but no issues with breathing. He has never undergone throat surgery and has not experienced any unintentional weight loss.  He has a history of squamous cell carcinoma of the left upper lung and is under the care of Bryan Fleming. His lung cancer was treated surgically with adjunctive chemotherapy. He continues to smoke about a pack a day and reports an approximate 30-pack-year history. His alcohol intake is limited to 32 ounces of beer per week, with occasional consumption of hard liquor. He does not use recreational drugs.   Past Medical History:  Diagnosis Date   Cancer [facility])    Lung cancer   COPD (chronic obstructive pulmonary disease) (HCC) 2007   EMPHYSEMA   Depression    Dyspnea    occasional - no oxygen   GERD (gastroesophageal reflux disease) 2010   Hepatitis    Alcoholic    HLD (hyperlipidemia)    no meds   Hypertension    Port-A-Cath in place 01/01/2021    Past Surgical History:  Procedure Laterality Date   BIOPSY  05/15/2023   Procedure: BIOPSY;  Surgeon: Bryan Lofty., MD;  Location: [facility] ENDOSCOPY;  Service: Gastroenterology;;   BRONCHIAL BIOPSY  12/23/2020   Procedure: BRONCHIAL BIOPSIES;  Surgeon: Bryan Peer, MD;  Location: [facility] ENDOSCOPY;  Service: Pulmonary;;   BRONCHIAL BRUSHINGS  12/23/2020   Procedure: BRONCHIAL BRUSHINGS;  Surgeon: Bryan Peer, MD;  Location: [facility] ENDOSCOPY;  Service: Pulmonary;;   BRONCHIAL NEEDLE ASPIRATION BIOPSY   12/23/2020   Procedure: BRONCHIAL NEEDLE ASPIRATION BIOPSIES;  Surgeon: Bryan Peer, MD;  Location: [facility] ENDOSCOPY;  Service: Pulmonary;;   BRONCHIAL WASHINGS  12/23/2020   Procedure: BRONCHIAL WASHINGS;  Surgeon: Bryan Peer, MD;  Location[facility]COPY;  Service: Pulmonary;;   CARDIAC CATHETERIZATION  2010   COLONOSCOPY  03/14/2012   Procedure: COLONOSCOPY;  Surgeon: Bryan Hippo, MD;  Location: [facility] ENDO SUITE;  Service: Endoscopy;  Laterality: N/A;  100   ESOPHAGOGASTRODUODENOSCOPY (EGD) WITH PROPOFOL N/A 05/15/2023   Procedure: ESOPHAGOGASTRODUODENOSCOPY (EGD) WITH PROPOFOL;  Surgeon: Bryan Score Netty Starring., MD;  Location: [facility] ENDOSCOPY;  Service: Gastroenterology;  Laterality: N/A;   EUS N/A 05/15/2023   Procedure: UPPER ENDOSCOPIC ULTRASOUND (EUS) RADIAL;  Surgeon: Bryan Lofty., MD;  Location: [facility] ENDOSCOPY;  Service: Gastroenterology;  Laterality: N/A;   INTERCOSTAL NERVE BLOCK  04/21/2021   Procedure: INTERCOSTAL NERVE BLOCK;  Surgeon: Bryan Slot, MD;  Location: [facility] OR;  Service: Thoracic;;   IR IMAGING GUIDED PORT INSERTION  01/12/2021   LUNG REMOVAL, PARTIAL  08/2021   PENILE PROSTHESIS IMPLANT N/A 02/17/2022   Procedure: PENILE PROTHESIS INFLATABLE;  Surgeon: Bryan Gauze, MD;  Location: [facility] ORS;  Service: Urology;  Laterality: N/A;   PORTACATH PLACEMENT  07/2021   REMOVAL OF PENILE PROSTHESIS N/A 10/13/2022   Procedure: REMOVAL OF PENILE PROSTHESIS;  Surgeon: Bryan Gauze, MD;  Location: [facility] ORS;  Service: Urology;  Laterality: N/A;   REMOVAL OF PENILE PROSTHESIS N/A 12/05/2022  Procedure: REMOVAL AND REPLACEMENT OF PENILE PROSTHESIS;  Surgeon: Bryan Arias, MD;  Location: [facility] ORS;  Service: Urology;  Laterality: N/A;  120 MINUTES NEEDED FOR CASE   VIDEO ASSISTED THORACOSCOPY (VATS)/ LOBECTOMY Left 04/21/2021   Procedure: VIDEO ASSISTED THORACOSCOPY (VATS)/ LOBECTOMY;  Surgeon: Bryan Slot, MD;  Location: [facility] OR;  Service: Thoracic;   Laterality: Left;   VIDEO BRONCHOSCOPY WITH ENDOBRONCHIAL NAVIGATION N/A 12/23/2020   Procedure: VIDEO BRONCHOSCOPY WITH ENDOBRONCHIAL NAVIGATION;  Surgeon: Bryan Peer, MD;  Location: [facility] ENDOSCOPY;  Service: Pulmonary;  Laterality: N/A;   WRIST GANGLION EXCISION  1985    Family History  Problem Relation Age of Onset   Hypertension Mother    Asthma Father    Diabetes Sister    Hypertension Sister    Hypertension Brother    Hypertension Sister    Lupus Daughter     Social History:  reports that he has been smoking cigarettes. He has a 37 pack-year smoking history. He has never used smokeless tobacco. He reports current alcohol use of about 56.0 standard drinks of alcohol per week. He reports that he does not use drugs.  Allergies: No Known Allergies  Medications Prior to Admission  Medication Sig Dispense Refill   albuterol (VENTOLIN HFA) 108 (90 Base) MCG/ACT inhaler Inhale 1-2 puffs into the lungs every 6 (six) hours as needed for wheezing or shortness of breath.     amLODipine (NORVASC) 5 MG tablet Take 5 mg by mouth in the morning.     Fluticasone-Umeclidin-Vilant (TRELEGY ELLIPTA) 100-62.5-25 MCG/INH AEPB Inhale 1 puff into the lungs in the morning.     acetaminophen (TYLENOL) 500 MG tablet Take 2 tablets (1,000 mg total) by mouth every 6 (six) hours. 45 tablet 0   celecoxib (CELEBREX) 200 MG capsule Take 1 capsule (200 mg total) by mouth 2 (two) times daily. (Patient not taking: Reported on 06/27/2023) 28 capsule 1   gabapentin (NEURONTIN) 300 MG capsule Take 300 mg by mouth 3 (three) times daily. (Patient not taking: Reported on 06/27/2023)     Multiple Vitamin (MULTIVITAMIN WITH MINERALS) TABS tablet Take 1 tablet by mouth in the morning.     omeprazole (PRILOSEC) 40 MG capsule Take 1 capsule (40 mg total) by mouth 2 (two) times daily. 60 capsule 12    No results found for this or any previous visit (from the past 48 hour(s)). No results found.  ROS: ROS  Blood  pressure 127/74, pulse 87, temperature 97.8 F (36.6 C), temperature source Temporal, resp. rate 16, height 5\' 9"  (1.753 m), weight 53.9 kg, SpO2 99%.  PHYSICAL EXAM: Physical Exam Constitutional:      Appearance: Normal appearance.  Pulmonary:     Effort: Pulmonary effort is normal.  Neurological:     General: No focal deficit present.     Mental Status: He is alert.  Psychiatric:        Mood and Affect: Mood normal.     Studies Reviewed: CT Neck reviewed   Assessment/Plan Malosi Niemczyk is a 62 y.o. male with standing history of tobacco and alcohol use, as well as history of squamous cell carcinoma of the left upper lung, status post surgical treatment and adjuvant chemotherapy with supraglottic mass incidentally noted on recent upper endoscopy performed for dysphagia. Exam demonstrated 2 distinct exophytic lesions, one in the supraglottis and 1 in the oropharynx, both of which are concerning for primary head neck cancer. Patient has a palpable lymph node in the right neck concerning for metastatic  spread.  -To OR for microlaryngoscopy and biopsy. Risks, recovery were reviewed. All questions answered.    Arby Dahir A Sheva Mcdougle 07/04/2023, 1:20 PM

## 2023-07-04 NOTE — Discharge Instructions (Addendum)
[place] [facility] POST OP INSTRUCTIONS: LARYNGOSCOPY  The Surgery Itself Laryngoscopy with biopsy or injection involves a brief general anesthesia, typically for  less than one hour. Patients may be sedated for several hours after surgery and may  remain sleepy for the better part of the day. Nausea and vomiting are occasionally seen,  and usually resolve by the evening of surgery - even without additional medications.  Almost all patients can go home the day of surgery.  After Surgery ? You will have a sore throat from the metal instruments used to allow a good view  of your voice box for 3-5 days after surgery. Some patients may have sores on  the tongue or in the mouth. This is normal and you will be given pain  medications for this. If you have sores in the mouth, avoid citrus or acidic  foods/drinks since they will burn.  ? Avoid coughing or frequent throat clearing. Drinking water can help alleviate the  urge to clear the throat. ? Avoid any heavy lifting (more than 20 lbs), straining, exercise, or sports activities  for 2 weeks after surgery. ? Avoid alcohol, tobacco products, spicy foods, or eating late at night as these may  cause heartburn or stomach reflux and may delay the healing process. ? Your voice may be hoarse after surgery from swelling of the vocal cords caused  by manipulation. ? You do not have to avoid talking unless your surgeon gives you specific  instructions. Use your normal voice since whispering is harder on your vocal  cords then talking at a regular volume.   Medications ? Pain medication can be used for pain as prescribed. Pain in the throat and the  tongue is normal. ? Some patients will be given steroids or acid reducing medications after surgery,  take these as directed. ? Take all of your routine medications as prescribed, unless told otherwise by your  surgeon. Any medications that thin the blood should be avoided unless approved  by your surgeon.   ? IT IS OK TO TAKE OVER THE COUNTER PAIN MEDICATION  (IBUPROFEN, NAPROXEN, or ACETAMINOPHEN) IN ADDITION TO  YOUR PRESCRIBED MEDICATIONS. DO NOT TAKE ASPIRIN UNLESS  CLEARED WITH YOUR SURGEON.  ? Limit Acetaminophen/Tylenol to less than 4,000mg /day  ? Limit Ibuprofen/Motrin to less than 3,600mg /day  Final Result ? If you have a biopsy in surgery, you will usually find out the pathology results  when you are seen in the office for follow up, or the surgeon will call.    Post Anesthesia Home Care Instructions  Activity: Get plenty of rest for the remainder of the day. A responsible individual must stay with you for 24 hours following the procedure.  For the next 24 hours, DO NOT: -Drive a car -Advertising copywriter -Drink alcoholic beverages -Take any medication unless instructed by your physician -Make any legal decisions or sign important papers.  Meals: Start with liquid foods such as gelatin or soup. Progress to regular foods as tolerated. Avoid greasy, spicy, heavy foods. If nausea and/or vomiting occur, drink only clear liquids until the nausea and/or vomiting subsides. Call your physician if vomiting continues.  Special Instructions/Symptoms: Your throat may feel dry or sore from the anesthesia or the breathing tube placed in your throat during surgery. If this causes discomfort, gargle with warm salt water. The discomfort should disappear within 24 hours.

## 2023-07-04 NOTE — Anesthesia Preprocedure Evaluation (Signed)
Anesthesia Evaluation  Patient identified by MRN, date of birth, ID band Patient awake    Reviewed: Allergy & Precautions, NPO status , Patient's Chart, lab work & pertinent test results  Airway Mallampati: II  TM Distance: >3 FB Neck ROM: Full    Dental no notable dental hx. (+) Teeth Intact, Dental Advisory Given   Pulmonary COPD,  COPD inhaler, Current Smoker and Patient abstained from smoking. Lung CA   Pulmonary exam normal breath sounds clear to auscultation       Cardiovascular hypertension, Pt. on medications Normal cardiovascular exam Rhythm:Regular Rate:Normal  TTE 2023 1. Left ventricular ejection fraction, by estimation, is 60 to 65%. The  left ventricle has normal function. The left ventricle has no regional  wall motion abnormalities. There is mild left ventricular hypertrophy.  Left ventricular diastolic parameters  are consistent with Grade I diastolic dysfunction (impaired relaxation).   2. Right ventricular systolic function is normal. The right ventricular  size is normal.   3. The mitral valve is normal in structure. No evidence of mitral valve  regurgitation. No evidence of mitral stenosis.   4. The aortic valve is tricuspid. There is mild calcification of the  aortic valve. There is mild thickening of the aortic valve. Aortic valve  regurgitation is mild. No aortic stenosis is present.   5. The inferior vena cava is normal in size with greater than 50%  respiratory variability, suggesting right atrial pressure of 3 mmHg.     Neuro/Psych negative neurological ROS     GI/Hepatic PUD,GERD  Medicated,,(+)     substance abuse  alcohol use, Hepatitis -  Endo/Other  negative endocrine ROS    Renal/GU negative Renal ROS  negative genitourinary   Musculoskeletal negative musculoskeletal ROS (+)    Abdominal Normal abdominal exam  (+)   Peds  Hematology   Anesthesia Other Findings    Reproductive/Obstetrics                             Anesthesia Physical Anesthesia Plan  ASA: 3  Anesthesia Plan: General   Post-op Pain Management:    Induction: Intravenous  PONV Risk Score and Plan: Propofol infusion and Treatment may vary due to age or medical condition  Airway Management Planned: Oral ETT  Additional Equipment: None  Intra-op Plan:   Post-operative Plan: Extubation in OR  Informed Consent: I have reviewed the patients History and Physical, chart, labs and discussed the procedure including the risks, benefits and alternatives for the proposed anesthesia with the patient or authorized representative who has indicated his/her understanding and acceptance.     Dental advisory given  Plan Discussed with: CRNA  Anesthesia Plan Comments:        Anesthesia Quick Evaluation

## 2023-07-04 NOTE — Op Note (Signed)
OPERATIVE NOTE  Bryan Fleming Date/Time of Admission: 07/04/2023 11:55 AM  CSN: [id number] Attending Provider: Cheron Schaumann A, DO Room/Bed: [facility]SP/NONE DOB: 1961/02/01 Age: 62 y.o.   Pre-Op Diagnosis: Supraglottic mass,Mass of oropharynx  Post-Op Diagnosis: Supraglottic mass,Mass of oropharynx  Procedure: Procedure(s): MICROLARYNGOSCOPY WITH BIOPSY  Anesthesia: General  Surgeon(s): Laren Boom, DO  Staff: Circulator: Lenn Cal, RN Scrub Person: Verdie Drown Circulator Assistant: McDonough-Hughes, Maceo Pro, RN  Implants: * No implants in log *  Specimens: ID Type Source Tests Collected by Time Destination  1 : Post cricoid Tissue PATH Other SURGICAL PATHOLOGY Ashe Graybeal A, DO 07/04/2023 1411   2 : Right arytenoid Tissue PATH Other SURGICAL PATHOLOGY Inocencio Roy A, DO 07/04/2023 1413   3 : Right  aryepiglottic fold Tissue PATH Other SURGICAL PATHOLOGY Colette Dicamillo A, DO 07/04/2023 1415   4 : Left soft palate Tissue PATH Other SURGICAL PATHOLOGY Jonty Morrical A, DO 07/04/2023 1417   5 : Right tonsil Tissue PATH Other SURGICAL PATHOLOGY Jhoselyn Ruffini A, DO 07/04/2023 1421     Complications: None  EBL: 20 ML  Condition: stable  Operative Findings:  Exophytic mucosal mass involving the right arytenoid, aryepiglottic fold, and post-cricoid area. Extension to vallecula noted, but no obvious mucosal abnormality of the epiglottis, false cords, or true cords.  Firm, abnormal mucosal changes of the soft palate, extending from the right anterior/posterior pillars to the left soft palate. Uvula absent. Firm palpable nodularity of the right tonsil.   Description of Operation: Once operative consent was obtained, and the surgical site confirmed with the operating room team, the patient was brought back to the operating room and general endotracheal anesthesia was obtained. The patient was turned over to the  ENT service. An operating laryngoscope was used to directly visualize the upper airway and glottis. All anatomic areas from the oral cavity to the glottis were examined and noted to be normal with only exceptions noted in this report. Areas examined included the oropharynx, vallecula, both surfaces of the epiglottis, glottis, post cricoid region and bilateral pyriform sinuses.   The patient was placed in laryngeal suspension with the focus on the glottis with the ET tube intact.   An operating microscope was used to visualize the lesion and several biopsies were taken and sent to pathology. Hemostasis was obtained with an Adrenaline soaked pledget.   A crow davis mouth gag was then used to better visualize the pathology in the oropharynx, and again, several biopsies were taken and sent to pathology. Hemostasis obtained with an Adrenaline soaked pledget and suction cautery.  An oral gastric tube was placed into the stomach and suctioned to reduce postoperative nausea. The patient was turned back over to the anesthesia service. The patient was transferred to the PACU in stable condition.    Laren Boom, DO [facility]  07/04/2023

## 2023-07-04 NOTE — Anesthesia Procedure Notes (Signed)
Procedure Name: Intubation Date/Time: 07/04/2023 1:51 PM  Performed by: Allyn Kenner, CRNAPre-anesthesia Checklist: Patient identified, Emergency Drugs available, Suction available and Patient being monitored Patient Re-evaluated:Patient Re-evaluated prior to induction Oxygen Delivery Method: Circle System Utilized Preoxygenation: Pre-oxygenation with 100% oxygen Induction Type: IV induction Ventilation: Mask ventilation without difficulty Laryngoscope Size: Glidescope and 3 Grade View: Grade I Tube type: Oral Tube size: 6.0 mm Number of attempts: 1 Airway Equipment and Method: Stylet and Oral airway Placement Confirmation: ETT inserted through vocal cords under direct vision, positive ETCO2 and breath sounds checked- equal and bilateral Secured at: 22 cm Tube secured with: Tape Dental Injury: Teeth and Oropharynx as per pre-operative assessment

## 2023-07-04 NOTE — Anesthesia Postprocedure Evaluation (Addendum)
Anesthesia Post Note  Patient: Bryan Fleming  Procedure(s) Performed: LARYNGOSCOPY WITH BIOPSIES (Throat)     Patient location during evaluation: Phase II Anesthesia Type: General Level of consciousness: awake Pain management: pain level controlled Vital Signs Assessment: post-procedure vital signs reviewed and stable Respiratory status: spontaneous breathing Cardiovascular status: stable Postop Assessment: no apparent nausea or vomiting Anesthetic complications: no   There were no known notable events for this encounter.  Last Vitals:  Vitals:   07/04/23 1500 07/04/23 1529  BP: (!) 184/83 (!) 172/92  Pulse: (!) 102 98  Resp: 15 16  Temp:  37.2 C  SpO2: 95% 94%    Last Pain:  Vitals:   07/04/23 1529  TempSrc: Temporal  PainSc: 8                  [name] [address]

## 2023-07-05 ENCOUNTER — Other Ambulatory Visit (HOSPITAL_COMMUNITY): Payer: Self-pay | Admitting: Otolaryngology

## 2023-07-05 ENCOUNTER — Encounter (HOSPITAL_BASED_OUTPATIENT_CLINIC_OR_DEPARTMENT_OTHER): Payer: Self-pay | Admitting: Otolaryngology

## 2023-07-05 DIAGNOSIS — J387 Other diseases of larynx: Secondary | ICD-10-CM

## 2023-07-05 DIAGNOSIS — J392 Other diseases of pharynx: Secondary | ICD-10-CM

## 2023-07-05 DIAGNOSIS — C328 Malignant neoplasm of overlapping sites of larynx: Secondary | ICD-10-CM

## 2023-07-05 NOTE — Progress Notes (Signed)
Oncology Nurse Navigator Documentation   Placed introductory call to new referral patient Delvecchio Wolbers.  Introduced myself as the H&N oncology nurse navigator that works with Dr. Basilio Cairo and Dr. Arlana Pouch to whom he has been referred by Dr. Marene Lenz.  He confirmed understanding of referral. Briefly explained my role as his navigator, provided my contact information.  I explained that he would be scheduled for a PET scan which he requested be done at [facility].  I explained the purpose of a dental evaluation prior to starting RT, indicated he would be contacted by [facility] to arrange an appointment for scatter protective devices.  I encouraged him to call with questions/concerns as he moves forward with appts and procedures.   He verbalized understanding of information provided, expressed appreciation for my call.   Hedda Slade RN, BSN, OCN Head & Neck Oncology Nurse Navigator [facility] Phone # [phone]  Fax # [phone]

## 2023-07-11 DIAGNOSIS — J449 Chronic obstructive pulmonary disease, unspecified: Secondary | ICD-10-CM | POA: Diagnosis not present

## 2023-07-11 DIAGNOSIS — I10 Essential (primary) hypertension: Secondary | ICD-10-CM | POA: Diagnosis not present

## 2023-07-11 DIAGNOSIS — F411 Generalized anxiety disorder: Secondary | ICD-10-CM | POA: Diagnosis not present

## 2023-07-11 DIAGNOSIS — C109 Malignant neoplasm of oropharynx, unspecified: Secondary | ICD-10-CM | POA: Diagnosis not present

## 2023-07-11 DIAGNOSIS — K219 Gastro-esophageal reflux disease without esophagitis: Secondary | ICD-10-CM | POA: Diagnosis not present

## 2023-07-11 DIAGNOSIS — Z23 Encounter for immunization: Secondary | ICD-10-CM | POA: Diagnosis not present

## 2023-07-13 ENCOUNTER — Encounter (HOSPITAL_COMMUNITY)
Admission: RE | Admit: 2023-07-13 | Discharge: 2023-07-13 | Disposition: A | Discharge status: Home / Self Care | Payer: Medicare HMO | Source: Ambulatory Visit | Attending: Otolaryngology | Admitting: Otolaryngology

## 2023-07-13 DIAGNOSIS — J392 Other diseases of pharynx: Secondary | ICD-10-CM | POA: Insufficient documentation

## 2023-07-13 DIAGNOSIS — C328 Malignant neoplasm of overlapping sites of larynx: Secondary | ICD-10-CM | POA: Diagnosis not present

## 2023-07-13 DIAGNOSIS — K056 Periodontal disease, unspecified: Secondary | ICD-10-CM | POA: Diagnosis not present

## 2023-07-13 DIAGNOSIS — J387 Other diseases of larynx: Secondary | ICD-10-CM | POA: Insufficient documentation

## 2023-07-13 DIAGNOSIS — C329 Malignant neoplasm of larynx, unspecified: Secondary | ICD-10-CM | POA: Diagnosis not present

## 2023-07-13 LAB — SURGICAL PATHOLOGY

## 2023-07-13 MED ORDER — FLUDEOXYGLUCOSE F - 18 (FDG) INJECTION
6.1700 | Freq: Once | INTRAVENOUS | Status: AC | PRN
  Administered 2023-07-13: 6.17 via INTRAVENOUS

## 2023-07-14 DIAGNOSIS — C109 Malignant neoplasm of oropharynx, unspecified: Secondary | ICD-10-CM | POA: Diagnosis not present

## 2023-07-14 DIAGNOSIS — C099 Malignant neoplasm of tonsil, unspecified: Secondary | ICD-10-CM | POA: Diagnosis not present

## 2023-07-14 DIAGNOSIS — C051 Malignant neoplasm of soft palate: Secondary | ICD-10-CM | POA: Diagnosis not present

## 2023-07-18 NOTE — Progress Notes (Signed)
Head and Neck Cancer Location of Tumor / Histology: supraglottic mass    Patient presented for CT soft tissue which showed 2.4cm right supraglottic mass.   Biopsies revealed:    Nutrition Status Yes No Comments  Weight changes? [x]  []  Weight loss due to decreased appetite.   Swallowing concerns? [x]  []    PEG? []  [x]     Referrals Yes No Comments  Social Work? []  [x]    Dentistry? []  []    Swallowing therapy? []  [x]    Nutrition? [x]  []  Referral placed t  Med/Onc? [x]  []     Safety Issues Yes No Comments  Prior radiation? []  [x]    Pacemaker/ICD? []  [x]    Possible current pregnancy? []  [x]    Is the patient on methotrexate? []  [x]     Tobacco/Marijuana/Snuff/ETOH use: yes,tobaco  Past/Anticipated interventions by otolaryngology, if any: yes.    Past/Anticipated interventions by medical oncology, if any: yes      Current Complaints / other details:    Patient reports having pain to throat and jaw. Patient rates pain 8/10. Patient to start taking Oxycodone to assist with pain control. Referral placed to nutrition due to weight loss and decreased appetite.   BP (!) 161/80 (BP Location: Right Arm, Patient Position: Sitting, Cuff Size: Normal)   Pulse (!) 112   Temp 97.8 F (36.6 C)   Resp 20   Ht 5\' 9"  (1.753 m)   SpO2 100%   BMI 20.50 kg/m

## 2023-07-18 NOTE — Progress Notes (Signed)
[facility]  ONCOLOGY CONSULT NOTE   PATIENT NAME: Bryan Fleming   MR#: [id number] DOB: 18-Apr-1961  DATE OF SERVICE: 07/19/2023   REFERRING PHYSICIAN  Bryan Skotnicki, DO, otolaryngology  Patient Care Team: Bryan Spar, MD as PCP - General (Internal Medicine) Bryan Stade, MD as PCP - Cardiology (Cardiology) Bryan Sarah, RN as Oncology Nurse Navigator (Oncology) Bryan Massed, MD as Medical Oncologist (Medical Oncology) Malmfelt, Lise Auer, RN as Oncology Nurse Navigator Bryan Peak, MD as Consulting Physician (Radiation Oncology) Bryan Crutch, MD as Consulting Physician (Oncology) Bryan Boom, DO as Consulting Physician (Otolaryngology) Bryan Peak, MD as Attending Physician (Radiation Oncology)    CHIEF COMPLAINT/ PURPOSE OF CONSULTATION:   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   Discussed the use of AI scribe software for clinical note transcription with the patient, who gave verbal consent to proceed.   I have reviewed his chart and materials related to his cancer extensively and collaborated history with the patient. Summary of oncologic history is as follows:  Patient was being followed by Dr. Ellin Fleming at [facility] for history of squamous cell carcinoma of left upper lobe of lung, diagnosed in  May 2022.  He underwent neoadjuvant chemotherapy with 3 cycles of carboplatin, paclitaxel and nivolumab from 01/13/2021 through 02/24/2021 based on [id number]. This was followed by left upper lobectomy and lymph node resection on 04/21/2021. Pathology consistent with invasive moderately differentiated squamous cell carcinoma, 5 cm, margins negative.  Visceral pleura not involved.  Metastatic squamous cell carcinoma in 1/2 hilar lymph nodes.  Levels 5, 11, 12, 10, 11 and 12 lymph nodes were negative.  No definitive tumor response identified.  No lymphovascular invasion.  ypT2b, ypN1. Caris testing on resection sample from 04/21/2021 shows PD-L1 TPS 1%.  No targetable mutations.  MSI stable.  TMB low.   On routine restaging CT of the chest on 09/16/2022, there was no evidence of recurrent or metastatic disease in the chest.  However there was pancreatic ductal dilatation and hence CT abdomen was recommended.  On 11/02/2022 CT abdomen with and without contrast showed 1.4 x 1.1 cm focus of hypoenhancement/ hypoattenuation in the region of the ampulla.  No discrete mass identified.  This was associated with dilation of the main pancreatic duct.  MRI/MRCP was recommended.  MRCP on 01/09/2023 showed similar findings.  Patient was referred to [facility], [facility] for further evaluation/ERCP.  Patient canceled few appointments and eventually established with Bryan [facility] on 05/15/2023.  ERCP/EUS was obtained on 05/15/2023.  EGD showed obvious mass lesion noted at vallecular fold.  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  Congested major papula.  EUS showed pancreatic parenchymal abnormalities consisting of hyperechoic foci, lobularity and hyperechoic strands in the pancreatic head, genu of pancreas and pancreatic body and tail.  There was no sign of significant pathology in the CBD and in the common hepatic duct.  No malignant appearing lymph nodes.  On 06/19/2023, CT soft tissue of the neck  showed 2.4 cm right  supraglottic mass consistent with malignancy.  Pathologic right level 3 lymph node consistent with metastatic disease.  Asymmetric soft tissue in the left lateral upper oropharynx, indeterminate for second mucosal malignancy.  New 11 mm subsolid pulmonary nodule in the right upper lobe with additional smaller peribronchovascular groundglass densities elsewhere in the right upper lobe.  Most likely infectious or inflammatory, however attention recommended on PET/CT to exclude primary lung malignancy or metastatic disease.  Patient was referred to ENT Dr. Marene Fleming for further evaluation of incidentally found throat mass on EGD. Exam demonstrated 2 distinct exophytic lesions, one in the supraglottis and 1 in the oropharynx, both of which were concerning for primary head and neck cancer. Patient had a palpable lymph node in the right neck concerning for metastatic spread. He was taken to OR for microlaryngoscopy and biopsy on 07/04/2023.  Operative findings included exophytic mucosal mass involving the right arytenoid, aryepiglottic fold and postcricoid area.  Extension to the vallecula noted but no obvious mucosal abnormality of the epiglottis, false cords or true cords.  From, abnormal mucosal changes of the soft palate extending from the right anterior/posterior pillars to the left soft palate.  Uvula absent.  Firm palpable nodularity of the right tonsil.  Pathology from postcricoid, right arytenoid, right aryepiglottic, left soft palate, right tonsil all came back positive for invasive squamous cell carcinoma, moderately to poorly differentiated.  p16 negative.  On 07/13/2023, staging PET/CT showed hypermetabolic right sided supraglottic mass consistent with the patient's known primary malignancy.  Hypermetabolic adjacent right level 3 lymph node consistent with metastatic disease.  No findings for distant metastatic disease involving the chest, abdomen or pelvis.  Stable surgical changes from left upper lobe  lobectomy.  No findings for residual or recurrent lung cancer.    Oncology History  Primary lung squamous cell carcinoma, left (HCC)  12/31/2020 Cancer Staging   Staging form: Lung, AJCC 8th Edition - Clinical stage from 12/31/2020: Stage IIIA (cT2b, cN2, cM0) - Signed by Bryan Massed, MD on 01/01/2021 Stage prefix: Initial diagnosis   01/01/2021 Initial Diagnosis   Primary lung squamous cell carcinoma, left (HCC)   01/13/2021 - 02/24/2021 Chemotherapy   Patient is on Treatment Plan : LUNG NSCLC Carboplatin / Paclitaxel q21d x 3 cycles     Cancer of soft palate (HCC)  07/19/2023 Initial Diagnosis   Cancer of soft palate (HCC)   07/19/2023 Cancer Staging   Staging form: Pharynx - P16 Negative Oropharynx, AJCC 8th Edition - Clinical stage from 07/19/2023: Stage III (cT3, cN1, cM0, p16-) - Signed by Bryan Peak, MD on 07/19/2023 Stage prefix: Initial diagnosis   07/31/2023 -  Chemotherapy   Patient is on Treatment Plan : HEAD/NECK Cisplatin (40) q7d     Malignant neoplasm of supraglottis (HCC)  07/19/2023 Initial Diagnosis   Malignant neoplasm of supraglottis (HCC)   07/31/2023 -  Chemotherapy   Patient is on Treatment Plan : HEAD/NECK Cisplatin (40) q7d       INTERVAL HISTORY:  He reports recent changes in his voice and difficulty swallowing solid foods, describing the sensation as "scratchy" and certain foods as "hard to swallow." He also reports throat pain.The patient is a current smoker, consuming about a pack of cigarettes per day.  MEDICAL HISTORY:  Past Medical History:  Diagnosis Date   Cancer [facility])    Lung cancer   COPD (chronic obstructive pulmonary disease) (HCC) 2007   EMPHYSEMA   Depression    Dyspnea    occasional - no  oxygen   GERD (gastroesophageal reflux disease) 2010   Hepatitis    Alcoholic    HLD (hyperlipidemia)    no meds   Hypertension    Port-A-Cath in place 01/01/2021    SURGICAL HISTORY: Past Surgical History:  Procedure  Laterality Date   BIOPSY  05/15/2023   Procedure: BIOPSY;  Surgeon: Bryan Fleming Netty Starring., MD;  Location: [facility] ENDOSCOPY;  Service: Gastroenterology;;   BRONCHIAL BIOPSY  12/23/2020   Procedure: BRONCHIAL BIOPSIES;  Surgeon: Leslye Peer, MD;  Location: [facility] ENDOSCOPY;  Service: Pulmonary;;   BRONCHIAL BRUSHINGS  12/23/2020   Procedure: BRONCHIAL BRUSHINGS;  Surgeon: Leslye Peer, MD;  Location[facility] ENDOSCOPY;  Service: Pulmonary;;   BRONCHIAL NEEDLE ASPIRATION BIOPSY  12/23/2020   Procedure: BRONCHIAL NEEDLE ASPIRATION BIOPSIES;  Surgeon: Leslye Peer, MD;  Location: [facility] ENDOSCOPY;  Service: Pulmonary;;   BRONCHIAL WASHINGS  12/23/2020   Procedure: BRONCHIAL WASHINGS;  Surgeon: Leslye Peer, MD;  Location: [facility] ENDOSCOPY;  Service: Pulmonary;;   CARDIAC CATHETERIZATION  2010   COLONOSCOPY  03/14/2012   Procedure: COLONOSCOPY;  Surgeon: Malissa Hippo, MD;  Location: [facility] END[facility];  Service: Endoscopy;  Laterality: N/A;  100   ESOPHAGOGASTRODUODENOSCOPY (EGD) WITH PROPOFOL N/A 05/15/2023   Procedure: ESOPHAGOGASTRODUODENOSCOPY (EGD) WITH PROPOFOL;  Surgeon: Bryan Fleming Netty Starring., MD;  Location: [facility] ENDOSCOPY;  Service: Gastroenterology;  Laterality: N/A;   EUS N/A 05/15/2023   Procedure: UPPER ENDOSCOPIC ULTRASOUND (EUS) RADIAL;  Surgeon: Lemar Lofty., MD;  Location: [facility] ENDOSCOPY;  Service: Gastroenterology;  Laterality: N/A;   INTERCOSTAL NERVE BLOCK  04/21/2021   Procedure: INTERCOSTAL NERVE BLOCK;  Surgeon: Loreli Slot, MD;  Location[facility] OR;  Service: Thoracic;;   IR IMAGING GUIDED PORT INSERTION  01/12/2021   LARYNGOSCOPY N/A 07/04/2023   Procedure: LARYNGOSCOPY WITH BIOPSIES;  Surgeon: Bryan Boom, DO;  Location: [facility];  Service: ENT;  Laterality: N/A;   LUNG REMOVAL, PARTIAL  08/2021   PENILE PROSTHESIS IMPLANT N/A 02/17/2022   Procedure: PENILE PROTHESIS INFLATABLE;  Surgeon: [name], MD;  Location: [facility] ORS;  Service:  Urology;  Laterality: N/A;   PORTACATH PLACEMENT  07/2021   REMOVAL OF PENILE PROSTHESIS N/A 10/13/2022   Procedure: REMOVAL OF PENILE PROSTHESIS;  Surgeon: [name], MD;  Location: [facility] ORS;  Service: Urology;  Laterality: N/A;   REMOVAL OF PENILE PROSTHESIS N/A 12/05/2022   Procedure: REMOVAL AND REPLACEMENT OF PENILE PROSTHESIS;  Surgeon: [name], MD;  Location: [facility] ORS;  Service: Urology;  Laterality: N/A;  120 MINUTES NEEDED FOR CASE   VIDEO ASSISTED THORACOSCOPY (VATS)/ LOBECTOMY Left 04/21/2021   Procedure: VIDEO ASSISTED THORACOSCOPY (VATS)/ LOBECTOMY;  Surgeon: Loreli Slot, MD;  Location: [facility] OR;  Service: Thoracic;  Laterality: Left;   VIDEO BRONCHOSCOPY WITH ENDOBRONCHIAL NAVIGATION N/A 12/23/2020   Procedure: VIDEO BRONCHOSCOPY WITH ENDOBRONCHIAL NAVIGATION;  Surgeon: Leslye Peer, MD;  Location: [facility] ENDOSCOPY;  Service: Pulmonary;  Laterality: N/A;   WRIST GANGLION EXCISION  1985    SOCIAL HISTORY: Social History   Socioeconomic History   Marital status: Married    Spouse name: Not on file   Number of children: Not on file   Years of education: Not on file   Highest education level: Not on file  Occupational History   Not on file  Tobacco Use   Smoking status: Every Day    Current packs/day: 1.00    Average packs/day: 1 pack/day for 37.0 years (37.0 ttl pk-yrs)  Types: Cigarettes   Smokeless tobacco: Never   Tobacco comments:    smokes 1/2 pack a day [facility] 03/08/21  Vaping Use   Vaping status: Never Used  Substance and Sexual Activity   Alcohol use: Yes    Alcohol/week: 56.0 standard drinks of alcohol    Types: 35 Cans of beer, 21 Shots of liquor per week    Comment: 5-6 beers and 2-3 shots daily-04/19/21   Drug use: No   Sexual activity: Yes  Other Topics Concern   Not on file  Social History Narrative   Not on file   Social Drivers of Health   Financial Resource Strain: Not on file  Food Insecurity: Food Insecurity Present  (07/19/2023)   Hunger Vital Sign    Worried About Running Out of Food in the Last Year: Sometimes true    Ran Out of Food in the Last Year: Sometimes true  Transportation Needs: No Transportation Needs (07/19/2023)   PRAPARE - Administrator, Civil Service (Medical): No    Lack of Transportation (Non-Medical): No  Physical Activity: Inactive (12/03/2020)   Exercise Vital Sign    Days of Exercise per Week: 0 days    Minutes of Exercise per Session: 0 min  Stress: Not on file  Social Connections: Not on file  Intimate Partner Violence: Not At Risk (07/19/2023)   Humiliation, Afraid, Rape, and Kick questionnaire    Fear of Current or Ex-Partner: No    Emotionally Abused: No    Physically Abused: No    Sexually Abused: No    FAMILY HISTORY: Family History  Problem Relation Age of Onset   Hypertension Mother    Asthma Father    Diabetes Sister    Hypertension Sister    Hypertension Brother    Hypertension Sister    Lupus Daughter     ALLERGIES:  He has no known allergies.  MEDICATIONS:  Current Outpatient Medications  Medication Sig Dispense Refill   nicotine (NICODERM CQ) 14 mg/24hr patch Place 1 patch (14 mg total) onto the skin daily. (Patient not taking: Reported on 07/19/2023) 28 patch 1   oxyCODONE 10 MG TABS Take 1 tablet (10 mg total) by mouth every 4 (four) hours as needed for severe pain (pain Fleming 7-10). 90 tablet 0   acetaminophen (TYLENOL) 500 MG tablet Take 2 tablets (1,000 mg total) by mouth every 6 (six) hours. 45 tablet 0   albuterol (VENTOLIN HFA) 108 (90 Base) MCG/ACT inhaler Inhale 1-2 puffs into the lungs every 6 (six) hours as needed for wheezing or shortness of breath.     amLODipine (NORVASC) 5 MG tablet Take 5 mg by mouth in the morning.     celecoxib (CELEBREX) 200 MG capsule Take 1 capsule (200 mg total) by mouth 2 (two) times daily. (Patient not taking: Reported on 06/27/2023) 28 capsule 1   dexamethasone (DECADRON) 4 MG tablet Take 2  tablets (8 mg) by mouth daily x 3 days starting the day after cisplatin chemotherapy. Take with food. 30 tablet 1   Fluticasone-Umeclidin-Vilant (TRELEGY ELLIPTA) 100-62.5-25 MCG/INH AEPB Inhale 1 puff into the lungs in the morning.     gabapentin (NEURONTIN) 300 MG capsule Take 300 mg by mouth 3 (three) times daily. (Patient not taking: Reported on 06/27/2023)     lidocaine-prilocaine (EMLA) cream Apply to affected area once 30 g 3   Multiple Vitamin (MULTIVITAMIN WITH MINERALS) TABS tablet Take 1 tablet by mouth in the morning. (Patient not taking: Reported on 07/19/2023)  omeprazole (PRILOSEC) 40 MG capsule Take 1 capsule (40 mg total) by mouth 2 (two) times daily. (Patient not taking: Reported on 07/19/2023) 60 capsule 12   ondansetron (ZOFRAN) 8 MG tablet Take 1 tablet (8 mg total) by mouth every 8 (eight) hours as needed for nausea or vomiting. Start on the third day after cisplatin. 30 tablet 1   prochlorperazine (COMPAZINE) 10 MG tablet Take 1 tablet (10 mg total) by mouth every 6 (six) hours as needed (Nausea or vomiting). 30 tablet 1   No current facility-administered medications for this visit.    REVIEW OF SYSTEMS:    Review of Systems - Oncology  All other pertinent systems were reviewed with the patient and are negative.  PHYSICAL EXAMINATION:  ECOG PERFORMANCE STATUS: 1 - Symptomatic but completely ambulatory  Vitals:   07/19/23 1108  BP: 139/75  Pulse: (!) 118  Resp: 20  Temp: 98.1 F (36.7 C)   Filed Weights   07/19/23 1108  Weight: 138 lb 12.8 oz (63 kg)    Physical Exam Constitutional:      General: He is not in acute distress.    Appearance: Normal appearance.  HENT:     Head: Normocephalic and atraumatic.     Mouth/Throat:     Comments: Erythematous, subtly friable mucosa more notable in the left soft palate than the right soft palate.   Eyes:     General: No scleral icterus.    Conjunctiva/sclera: Conjunctivae normal.  Cardiovascular:     Rate  and Rhythm: Normal rate and regular rhythm.     Heart sounds: Normal heart sounds.  Pulmonary:     Effort: Pulmonary effort is normal.     Breath sounds: Wheezing (bilateral, scattered) present.  Chest:     Comments: Right-sided Port-A-Cath in place, without any signs of infection. Abdominal:     General: There is no distension.  Musculoskeletal:     Right lower leg: No edema.     Left lower leg: No edema.  Lymphadenopathy:     Cervical: Cervical adenopathy present.  Neurological:     General: No focal deficit present.     Mental Status: He is alert and oriented to person, place, and time.  Psychiatric:        Mood and Affect: Mood normal.        Behavior: Behavior normal.        Thought Content: Thought content normal.     LABORATORY DATA:   I have reviewed the data as listed.  Results for orders placed or performed in visit on 07/19/23  TSH  Result Value Ref Range   TSH 0.701 0.350 - 4.500 uIU/mL  Comprehensive metabolic panel  Result Value Ref Range   Sodium 142 135 - 145 mmol/L   Potassium 4.4 3.5 - 5.1 mmol/L   Chloride 107 98 - 111 mmol/L   CO2 27 22 - 32 mmol/L   Glucose, Bld 100 (H) 70 - 99 mg/dL   BUN 10 8 - 23 mg/dL   Creatinine, Ser 0.86 (L) 0.61 - 1.24 mg/dL   Calcium 9.8 8.9 - 57.8 mg/dL   Total Protein 8.1 6.5 - 8.1 g/dL   Albumin 4.2 3.5 - 5.0 g/dL   AST 27 15 - 41 U/L   ALT 9 0 - 44 U/L   Alkaline Phosphatase 93 38 - 126 U/L   Total Bilirubin 0.4 <1.2 mg/dL   GFR, Estimated >46 >96 mL/min   Anion gap 8 5 - 15  CBC  with Differential/Platelet  Result Value Ref Range   WBC 10.2 4.0 - 10.5 K/uL   RBC 4.45 4.22 - 5.81 MIL/uL   Hemoglobin 14.4 13.0 - 17.0 g/dL   HCT 19.1 47.8 - 29.5 %   MCV 96.2 80.0 - 100.0 fL   MCH 32.4 26.0 - 34.0 pg   MCHC 33.6 30.0 - 36.0 g/dL   RDW 62.1 30.8 - 65.7 %   Platelets 261 150 - 400 K/uL   nRBC 0.0 0.0 - 0.2 %   Neutrophils Relative % 68 %   Neutro Abs 7.0 1.7 - 7.7 K/uL   Lymphocytes Relative 22 %   Lymphs  Abs 2.2 0.7 - 4.0 K/uL   Monocytes Relative 8 %   Monocytes Absolute 0.8 0.1 - 1.0 K/uL   Eosinophils Relative 1 %   Eosinophils Absolute 0.1 0.0 - 0.5 K/uL   Basophils Relative 1 %   Basophils Absolute 0.1 0.0 - 0.1 K/uL   Immature Granulocytes 0 %   Abs Immature Granulocytes 0.04 0.00 - 0.07 K/uL     RADIOGRAPHIC STUDIES:  I have personally reviewed the radiological images as listed and agree with the findings in the report.  NM PET Image Initial (PI) Skull Base To Thigh (F-18 FDG) Result Date: 07/13/2023 CLINICAL DATA:  Initial treatment strategy for squamous cell carcinoma of the larynx. History of non-small cell lung cancer status post left upper lobe lobectomy. EXAM: NUCLEAR MEDICINE PET SKULL BASE TO THIGH TECHNIQUE: 6.17 mCi F-18 FDG was injected intravenously. Full-ring PET imaging was performed from the skull base to thigh after the radiotracer. CT data was obtained and used for attenuation correction and anatomic localization. Fasting blood glucose: 107 mg/dl COMPARISON:  Prior PET-CT 12/14/2020 and neck CT 06/19/2023 FINDINGS: Mediastinal blood pool activity: SUV max 1.41 Liver activity: SUV max NA NECK: The right-sided supraglottic mass is hypermetabolic with SUV max of 8.04. The adjacent enlarged right level III lymph node is hypermetabolic with SUV max of 8.93. No other enlarged or hypermetabolic neck nodes are identified. Incidental CT findings: Age advanced bilateral carotid artery calcifications. CHEST: No hypermetabolic mediastinal or hilar nodes. No suspicious pulmonary nodules on the CT scan. No enlarged or hypermetabolic supraclavicular or axillary lymph nodes. Incidental CT findings: Stable surgical changes from a left upper lobe lobectomy. No findings suspicious for residual or recurrent lung cancer. Stable age advanced emphysematous changes and pulmonary scarring. Stable significant age advanced aortic and three-vessel coronary artery calcifications. The right IJ Port-A-Cath  is in good position. ABDOMEN/PELVIS: No abnormal hypermetabolic activity within the liver, pancreas, adrenal glands, or spleen. No hypermetabolic lymph nodes in the abdomen or pelvis. Incidental CT findings: Age advanced vascular disease but no aneurysm. A penile prosthesis is in place. 10 mm gallstone noted. SKELETON: No findings for osseous metastatic disease. Focus of hypermetabolism noted on the patient's skin and left groin area. This is probably urine contamination. No abnormality on the CT scan is identified. Incidental CT findings: None. IMPRESSION: 1. Hypermetabolic right-sided supraglottic mass consistent with the patient's known primary malignancy. 2. Hypermetabolic adjacent right level III lymph node consistent with metastatic disease. 3. No findings for distant metastatic disease involving the chest, abdomen/pelvis or bony structures. 4. Stable surgical changes from a left upper lobe lobectomy. No findings for residual or recurrent lung cancer. 5. Age advanced vascular disease. 6. Cholelithiasis. 7. Aortic atherosclerosis and emphysema. Aortic Atherosclerosis (ICD10-I70.0). Electronically Signed   By: Rudie Meyer M.D.   On: 07/13/2023 16:21    ASSESSMENT & PLAN:  62 y.o. 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    Cancer of soft palate (HCC) New diagnosis of squamous cell carcinoma in the supraglottis, oropharynx, multifocal disease.  Noted changes in voice and difficulty swallowing solid foods. No signs of lung cancer recurrence on PET scan.   He seems to have diffuse disease throughout much of his  oropharynx.  This is not appearing obviously on his PET scan.  He also has right supraglottic cancer and a hypermetabolic right level 3 lymph node consistent with lymphatic spread from his head and neck primaries.  He is less likely to be a surgical candidate given the extent of his disease.  Hence recommendation made to proceed with concurrent chemoradiation and patient and his daughter are agreeable with this plan.  -We have discussed about role of cisplatin being a radiosensitizer in the treatment of head and neck cancer.  We have discussed about the curative intent of chemoradiation for this patient.     We have discussed about mechanism of action of cisplatin, adverse effects of cisplatin including but not limited to fatigue, nausea, vomiting, increased risk of infections, mucositis, ototoxicity, nephrotoxicity, peripheral neuropathy.  Patient understands that some of the side effects can be permanent and even potentially fatal.  We have discussed about role of Mediport and G-tube for chemotherapy administration and nutrition respectively since most of these patients have severe mucositis during the treatment.  At this time we do not know if weekly cisplatin is inferior to every 21 days cisplatin since there is no head-to-head comparison trial.  We are awaiting results from NRG trial.  I did mention to the patient however weekly cisplatin is well-tolerated with less adverse effects and most of the patients tend to complete treatment as planned.  Patient is willing to proceed with weekly cisplatin.  -Plan for feeding tube placement to maintain hydration and nutrition during treatment due to expected mouth soreness and difficulty swallowing.  -He is scheduled to see Dr. Basilio Cairo later today.  I will plan to bring him back to clinic during the same week that he starts radiation treatments.  Will arrange chemo education in the interim.  Cancer associated pain - Refilled oxycodone and educated on judicial  use.  Nicotine dependence, cigarettes, with other nicotine-induced disorders Reports smoking a pack a day. Noted wheezing on both sides. -Prescribed nicotine patch and encourage smoking cessation for overall health and cancer prevention.  Primary lung squamous cell carcinoma, left (HCC) - Please see HPI above for details and past treatments at [facility], under the direction of Dr. Ellin Fleming.  -Remains in clinical remission.  Recent PET/CT showed no evidence of disease recurrence as far as lung cancer is concerned.  Will continue surveillance for now.  I spent a total of 60 minutes during this encounter with the patient including review of chart and various tests results, discussions about plan of care and coordination of care plan.  I reviewed lab results and outside records for this visit and discussed relevant results with the patient. Diagnosis, plan of care and treatment options were also discussed in detail with the patient. Opportunity provided to ask questions and answers provided to his apparent satisfaction. Provided  instructions to call our clinic with any problems, questions or concerns prior to return visit. I recommended to continue follow-up with PCP and sub-specialists. He verbalized understanding and agreed with the plan. No barriers to learning was detected.  NCCN guidelines have been consulted in the planning of this patient's care.  Bryan Crutch, MD  07/24/2023 9:55 AM  [facility] [address] [place] [address] Dept: [phone] Dept Fax: [phone]   Orders Placed This Encounter  Procedures   Consent Attestation for Oncology Treatment    The patient is informed of risks, benefits, side-effects of the prescribed oncology treatment. Potential short term and long term side effects and response rates discussed. After a long discussion, the patient made  informed decision to proceed.:   Yes   CBC with Differential/Platelet    Standing Status:   Future    Number of Occurrences:   1    Expiration Date:   07/18/2024   Comprehensive metabolic panel    Standing Status:   Future    Number of Occurrences:   1    Expiration Date:   07/18/2024   TSH    Standing Status:   Future    Number of Occurrences:   1    Expiration Date:   07/18/2024   CBC with Differential (Cancer Center Only)    Standing Status:   Future    Expected Date:   07/31/2023    Expiration Date:   07/30/2024   Basic Metabolic Panel - Cancer Center Only    Standing Status:   Future    Expected Date:   07/31/2023    Expiration Date:   07/30/2024   Magnesium    Standing Status:   Future    Expected Date:   07/31/2023    Expiration Date:   07/30/2024   CBC with Differential (Cancer Center Only)    Standing Status:   Future    Expected Date:   08/07/2023    Expiration Date:   08/06/2024   Basic Metabolic Panel - Cancer Center Only    Standing Status:   Future    Expected Date:   08/07/2023    Expiration Date:   08/06/2024   Magnesium    Standing Status:   Future    Expected Date:   08/07/2023    Expiration Date:   08/06/2024   CBC with Differential (Cancer Center Only)    Standing Status:   Future    Expected Date:   08/14/2023    Expiration Date:   08/13/2024   Basic Metabolic Panel - Cancer Center Only    Standing Status:   Future    Expected Date:   08/14/2023    Expiration Date:   08/13/2024   Magnesium    Standing Status:   Future    Expected Date:   08/14/2023    Expiration Date:   08/13/2024   CBC with Differential (Cancer Center Only)    Standing Status:   Future    Expected Date:   08/21/2023    Expiration Date:   08/20/2024   Basic Metabolic Panel - Cancer Center Only    Standing Status:   Future    Expected Date:   08/21/2023    Expiration Date:   08/20/2024   Magnesium    Standing Status:   Future    Expected Date:   08/21/2023  Expiration Date:   08/20/2024    CBC with Differential (Cancer Center Only)    Standing Status:   Future    Expected Date:   08/28/2023    Expiration Date:   08/27/2024   Basic Metabolic Panel - Cancer Center Only    Standing Status:   Future    Expected Date:   08/28/2023    Expiration Date:   08/27/2024   Magnesium    Standing Status:   Future    Expected Date:   08/28/2023    Expiration Date:   08/27/2024   CBC with Differential (Cancer Center Only)    Standing Status:   Future    Expected Date:   09/04/2023    Expiration Date:   09/03/2024   Basic Metabolic Panel - Cancer Center Only    Standing Status:   Future    Expected Date:   09/04/2023    Expiration Date:   09/03/2024   Magnesium    Standing Status:   Future    Expected Date:   09/04/2023    Expiration Date:   09/03/2024   CBC with Differential (Cancer Center Only)    Standing Status:   Future    Expected Date:   09/11/2023    Expiration Date:   09/10/2024   Basic Metabolic Panel - Cancer Center Only    Standing Status:   Future    Expected Date:   09/11/2023    Expiration Date:   09/10/2024   Magnesium    Standing Status:   Future    Expected Date:   09/11/2023    Expiration Date:   09/10/2024   PHYSICIAN COMMUNICATION ORDER    A baseline Audiogram is recommended prior to initiation of cisplatin chemotherapy.    CODE STATUS:  Code Status History     Date Active Date Inactive Code Status Order ID Comments User Context   12/01/2022 2127 12/06/2022 1709 Full Code [id number]  Starleen Arms, MD ED   10/13/2022 1155 10/14/2022 1706 Full Code [id number]  [name], MD Inpatient   02/17/2022 1635 02/18/2022 1856 Full Code [id number]  [name], MD Inpatient   04/21/2021 2124 04/23/2021 2111 Full Code [id number]  Harriet Pho, PA-C Inpatient   06/04/2020 1253 06/06/2020 1807 Full Code [id number]  Cleora Fleet, MD ED    Questions for Most Recent Historical Code Status (Order [id number])     Question Answer   By: Consent: discussion documented in  EHR            Future Appointments  Date Time Provider Department Center  07/24/2023  2:00 PM [facility] CHEMO EDU [facility] None  07/25/2023  2:30 PM Dinger, Renae Fickle, RD [facility] None  07/27/2023 12:00 PM [facility] 1 [facility]  07/31/2023  3:15 PM Bryan Peak, MD [facility] None  08/01/2023  7:00 AM [facility] LINAC 3 [facility] None  08/03/2023  3:15 PM [facility] LINAC 4 [facility] None  08/04/2023  3:15 PM [facility] LINAC 3 [facility] None  08/07/2023  3:15 PM [facility] [id number] [facility] None  08/08/2023  7:00 AM [facility] LINAC 4 [facility] None  08/10/2023  3:15 PM [facility] [id number] [facility] None  08/11/2023  3:15 PM [facility] [id number] [facility] None  08/14/2023  3:15 PM [facility] [id number] [facility] None  08/15/2023  3:15 PM [facility] [id number] [facility] None  08/16/2023  3:15 PM [facility] [id number] [facility] None  08/17/2023  3:15 PM [facility] [id number] [facility] None  08/18/2023  3:15 PM [facility] [id number] [facility] None  08/21/2023  3:15 PM [facility] [id number] [facility] None  08/22/2023  3:15 PM [facility] [id number] [facility] None  08/23/2023  3:15 PM [facility] [id number] [facility] None  08/24/2023  3:15 PM [facility] [id number] [facility] None  08/25/2023  3:15 PM [facility] [id number] [facility] None  08/28/2023  3:15 PM [facility] [id number] [facility] None  08/29/2023  3:15 PM [facility] [id number] [facility] None  08/30/2023  3:15 PM [facility] [id number] [facility] None  08/31/2023  3:15 PM [facility] [id number] [facility] None  09/01/2023  3:15 PM [facility] [id number] [facility] None  09/04/2023  3:15 PM [facility] [id number] [facility] None  09/05/2023  3:15 PM [facility] [id number] [facility] None  09/06/2023  3:15 PM [facility] [id number] [facility] None  09/07/2023  3:15 PM [facility] [id number] [facility] None  09/08/2023  3:15 PM [facility] [id number] [facility] None  09/11/2023  3:15 PM [facility]  [id number] [facility] None  09/12/2023  3:15 PM [facility] [id number] [facility] None  09/13/2023  3:15 PM [facility] [id number] [facility] None  09/14/2023  1:00 PM [facility]-AC[facility]A NURSE [facility] None  09/14/2023  2:00 PM AP-CT 1 AP-CT Laird [facility]  09/14/2023  3:15 PM [facility] [id number] [facility] None  09/15/2023  3:15 PM [facility] [id number] [facility] None  09/18/2023  3:15 PM [facility] [id number] [facility] None  09/19/2023  3:15 PM [facility] [id number] [facility] None  09/21/2023  3:00 PM Bryan Massed, MD [facility] None      This document was completed utilizing speech recognition software. Grammatical errors, random word insertions, pronoun errors, and incomplete sentences are an occasional consequence of this system due to software limitations, ambient noise, and hardware issues. Any formal questions or concerns about the content, text or information contained within the body of this dictation should be directly addressed to the provider for clarification.

## 2023-07-18 NOTE — Progress Notes (Signed)
Radiation Oncology         [phone] ________________________________  Initial Outpatient Consultation  Name: Bryan Fleming MRN: [id number]  Date: 07/19/2023  DOB: 1961-05-09  RK:YHCWC, [place] Bryan Fleming  Skotnicki, Meghan Fleming, Fleming   REFERRING PHYSICIAN: Skotnicki, Meghan Fleming, Fleming  DIAGNOSIS: No diagnosis found.   Cancer Staging  Primary lung squamous cell carcinoma, left (HCC) Staging form: Lung, AJCC 8th Edition - Clinical stage from 12/31/2020: Stage IIIA (cT2b, cN2, cM0) - Signed by Bryan Massed, Fleming on 01/01/2021 Stage prefix: Initial diagnosis   CHIEF COMPLAINT: Here to discuss management of laryngeal cancer  HISTORY OF PRESENT ILLNESS::Bryan Fleming is Fleming 62 y.o. male who presents today for consideration of radiation therapy in management of his recently diagnosed laryngeal cancer.   The patient presented for an EGD/EUS on 05/15/23 to evaluate Fleming possible ampullary process. Procedural findings incidentally revealed Fleming possible mass / lesion on the vallecular fold and cornicullate cartilage.   Subsequently, the patient was referred to Dr. Marene Lenz on 06/14/23 for further evaluation and management. ***  Biopsy of *** on *** revealed: ***  Pertinent imaging thus far includes *** performed on *** revealing ***.   ***  Swallowing issues, if any: ***  Weight Changes: ***  Pain status: ***  Other symptoms: ***  Tobacco history, if any: Current smoker with Fleming 30-pack-year smoking history   ETOH abuse, if any: Drinks approximately 32 ounces of beer per week, with occasional consumption of hard liquor   Prior cancers, if any: Yes: history of squamous cell carcinoma of the left upper lung diagnosed in May of 2022 : s/p 4 cycles of neoadjuvant chemotherapy (Carboplatin / Paclitaxel) delivered from 01/13/21 through 02/24/21 under the care of Dr. Ellin Saba   PREVIOUS RADIATION THERAPY: {EXAM; YES/NO:19492::"No"}  PAST MEDICAL HISTORY:  has Fleming past medical  history of Cancer (HCC), COPD (chronic obstructive pulmonary disease) (HCC) (2007), Depression, Dyspnea, GERD (gastroesophageal reflux disease) (2010), Hepatitis, HLD (hyperlipidemia), Hypertension, and Port-Fleming-Cath in place (01/01/2021).    PAST SURGICAL HISTORY: Past Surgical History:  Procedure Laterality Date   BIOPSY  05/15/2023   Procedure: BIOPSY;  Surgeon: Bryan Fleming Starring., Fleming;  Location: [facility]COPY;  Service: Gastroenterology;;   BRONCHIAL BIOPSY  12/23/2020   Procedure: BRONCHIAL BIOPSIES;  Surgeon: Leslye Peer, Fleming;  Location: [facility] ENDOSCOPY;  Service: Pulmonary;;   BRONCHIAL BRUSHINGS  12/23/2020   Procedure: BRONCHIAL BRUSHINGS;  Surgeon: Leslye Peer, Fleming;  Location: [facility] ENDOSCOPY;  Service: Pulmonary;;   BRONCHIAL NEEDLE ASPIRATION BIOPSY  12/23/2020   Procedure: BRONCHIAL NEEDLE ASPIRATION BIOPSIES;  Surgeon: Leslye Peer, Fleming;  Location: [facility] ENDOSCOPY;  Service: Pulmonary;;   BRONCHIAL WASHINGS  12/23/2020   Procedure: BRONCHIAL WASHINGS;  Surgeon: Leslye Peer, Fleming;  Location: [facility] ENDOSCOPY;  Service: Pulmonary;;   CARDIAC CATHETERIZATION  2010   COLONOSCOPY  03/14/2012   Procedure: COLONOSCOPY;  Surgeon: Malissa Hippo, Fleming;  [facility];  Service: Endoscopy;  Laterality: N/Fleming;  100   ESOPHAGOGASTRODUODENOSCOPY (EGD) WITH PROPOFOL N/Fleming 05/15/2023   Procedure: ESOPHAGOGASTRODUODENOSCOPY (EGD) WITH PROPOFOL;  Surgeon: Bryan Fleming Starring., Fleming;  Location: [facility] ENDOSCOPY;  Service: Gastroenterology;  Laterality: N/Fleming;   EUS N/Fleming 05/15/2023   Procedure: UPPER ENDOSCOPIC ULTRASOUND (EUS) RADIAL;  Surgeon: Bryan Lofty., Fleming;  Location: [facility] ENDOSCOPY;  Service: Gastroenterology;  Laterality: N/Fleming;   INTERCOSTAL NERVE BLOCK  04/21/2021   Procedure: INTERCOSTAL NERVE BLOCK;  Surgeon: Bryan Fleming;  Location: [facility] OR;  Service: Thoracic;;   IR  IMAGING GUIDED PORT INSERTION  01/12/2021   LARYNGOSCOPY N/Fleming 07/04/2023   Procedure: LARYNGOSCOPY WITH  BIOPSIES;  Surgeon: Bryan Fleming;  Location: [facility];  Service: ENT;  Laterality: N/Fleming;   LUNG REMOVAL, PARTIAL  08/2021   PENILE PROSTHESIS IMPLANT N/Fleming 02/17/2022   Procedure: PENILE PROTHESIS INFLATABLE;  Surgeon: Bryan Fleming;  Location: [facility] ORS;  Service: Urology;  Laterality: N/Fleming;   PORTACATH PLACEMENT  07/2021   REMOVAL OF PENILE PROSTHESIS N/Fleming 10/13/2022   Procedure: REMOVAL OF PENILE PROSTHESIS;  Surgeon: Bryan Fleming;  Location: [facility] ORS;  Service: Urology;  Laterality: N/Fleming;   REMOVAL OF PENILE PROSTHESIS N/Fleming 12/05/2022   Procedure: REMOVAL AND REPLACEMENT OF PENILE PROSTHESIS;  Surgeon: Bryan Fleming;  Location: [facility] ORS;  Service: Urology;  Laterality: N/Fleming;  120 MINUTES NEEDED FOR CASE   VIDEO ASSISTED THORACOSCOPY (VATS)/ LOBECTOMY Left 04/21/2021   Procedure: VIDEO ASSISTED THORACOSCOPY (VATS)/ LOBECTOMY;  Surgeon: Bryan Fleming;  Location: [facility] OR;  Service: Thoracic;  Laterality: Left;   VIDEO BRONCHOSCOPY WITH ENDOBRONCHIAL NAVIGATION N/Fleming 12/23/2020   Procedure: VIDEO BRONCHOSCOPY WITH ENDOBRONCHIAL NAVIGATION;  Surgeon: Leslye Peer, Fleming;  Location: [facility] ENDOSCOPY;  Service: Pulmonary;  Laterality: N/Fleming;   WRIST GANGLION EXCISION  1985    FAMILY HISTORY: family history includes Asthma in his father; Diabetes in his sister; Hypertension in his brother, mother, sister, and sister; Lupus in his daughter.  SOCIAL HISTORY:  reports that he has been smoking cigarettes. He has Fleming 37 pack-year smoking history. He has never used smokeless tobacco. He reports current alcohol use of about 56.0 standard drinks of alcohol per week. He reports that he does not use drugs.  ALLERGIES: Patient has no known allergies.  MEDICATIONS:  Current Outpatient Medications  Medication Sig Dispense Refill   acetaminophen (TYLENOL) 500 MG tablet Take 2 tablets (1,000 mg total) by mouth every 6 (six) hours. 45 tablet 0   albuterol (VENTOLIN HFA) 108  (90 Base) MCG/ACT inhaler Inhale 1-2 puffs into the lungs every 6 (six) hours as needed for wheezing or shortness of breath.     amLODipine (NORVASC) 5 MG tablet Take 5 mg by mouth in the morning.     celecoxib (CELEBREX) 200 MG capsule Take 1 capsule (200 mg total) by mouth 2 (two) times daily. (Patient not taking: Reported on 06/27/2023) 28 capsule 1   Fluticasone-Umeclidin-Vilant (TRELEGY ELLIPTA) 100-62.5-25 MCG/INH AEPB Inhale 1 puff into the lungs in the morning.     gabapentin (NEURONTIN) 300 MG capsule Take 300 mg by mouth 3 (three) times daily. (Patient not taking: Reported on 06/27/2023)     Multiple Vitamin (MULTIVITAMIN WITH MINERALS) TABS tablet Take 1 tablet by mouth in the morning.     omeprazole (PRILOSEC) 40 MG capsule Take 1 capsule (40 mg total) by mouth 2 (two) times daily. 60 capsule 12   No current facility-administered medications for this encounter.    REVIEW OF SYSTEMS:  Notable for that above.   PHYSICAL EXAM:  vitals were not taken for this visit.   General: Alert and oriented, in no acute distress HEENT: Head is normocephalic. Extraocular movements are intact. Oropharynx is notable for ***. Neck: Neck is notable for *** Heart: Regular in rate and rhythm with no murmurs, rubs, or gallops. Chest: Clear to auscultation bilaterally, with no rhonchi, wheezes, or rales. Abdomen: Soft, nontender, nondistended, with no rigidity or guarding. Extremities: No cyanosis or edema. Lymphatics: see Neck Exam Skin: No concerning  lesions. Musculoskeletal: symmetric strength and muscle tone throughout. Neurologic: Cranial nerves II through XII are grossly intact. No obvious focalities. Speech is fluent. Coordination is intact. Psychiatric: Judgment and insight are intact. Affect is appropriate.   ECOG = ***  0 - Asymptomatic (Fully active, able to carry on all predisease activities without restriction)  1 - Symptomatic but completely ambulatory (Restricted in physically  strenuous activity but ambulatory and able to carry out work of Fleming light or sedentary nature. For example, light housework, office work)  2 - Symptomatic, <50% in bed during the day (Ambulatory and capable of all self care but unable to carry out any work activities. Up and about more than 50% of waking hours)  3 - Symptomatic, >50% in bed, but not bedbound (Capable of only limited self-care, confined to bed or chair 50% or more of waking hours)  4 - Bedbound (Completely disabled. Cannot carry on any self-care. Totally confined to bed or chair)  5 - Death   Santiago Glad MM, Creech RH, Tormey DC, et al. [phone]). "Toxicity and response criteria of the [facility]". Am. Evlyn Clines. Oncol. 5 (6): 649-55   LABORATORY DATA:  Lab Results  Component Value Date   WBC 7.9 03/09/2023   HGB 14.8 03/09/2023   HCT 44.3 03/09/2023   MCV 95.9 03/09/2023   PLT 291 03/09/2023   CMP     Component Value Date/Time   NA 139 03/09/2023 1208   NA 128 (Fleming) 01/14/2011 0000   K 3.4 (L) 03/09/2023 1208   CL 105 03/09/2023 1208   CO2 24 03/09/2023 1208   GLUCOSE 102 (H) 03/09/2023 1208   BUN 7 03/09/2023 1208   BUN 6 01/14/2011 0000   CREATININE 0.62 03/09/2023 1208   CREATININE 0.64 (L) 11/21/2016 1520   CALCIUM 8.7 (L) 03/09/2023 1208   PROT 8.0 03/09/2023 1208   ALBUMIN 3.7 03/09/2023 1208   AST 37 03/09/2023 1208   ALT 18 03/09/2023 1208   ALKPHOS 90 03/09/2023 1208   BILITOT 0.4 03/09/2023 1208   GFRNONAA >60 03/09/2023 1208   GFRNONAA >89 02/02/2016 1155   GFRAA >60 12/06/2019 2028   GFRAA >89 02/02/2016 1155      Lab Results  Component Value Date   TSH 1.408 02/03/2022     RADIOGRAPHY: NM PET Image Initial (PI) Skull Base To Thigh (F-18 FDG)  Result Date: 07/13/2023 CLINICAL DATA:  Initial treatment strategy for squamous cell carcinoma of the larynx. History of non-small cell lung cancer status post left upper lobe lobectomy. EXAM: NUCLEAR MEDICINE PET SKULL BASE TO THIGH  TECHNIQUE: 6.17 mCi F-18 FDG was injected intravenously. Full-ring PET imaging was performed from the skull base to thigh after the radiotracer. CT data was obtained and used for attenuation correction and anatomic localization. Fasting blood glucose: 107 mg/dl COMPARISON:  Prior PET-CT 12/14/2020 and neck CT 06/19/2023 FINDINGS: Mediastinal blood pool activity: SUV max 1.41 Liver activity: SUV max NA NECK: The right-sided supraglottic mass is hypermetabolic with SUV max of 8.04. The adjacent enlarged right level III lymph node is hypermetabolic with SUV max of 8.93. No other enlarged or hypermetabolic neck nodes are identified. Incidental CT findings: Age advanced bilateral carotid artery calcifications. CHEST: No hypermetabolic mediastinal or hilar nodes. No suspicious pulmonary nodules on the CT scan. No enlarged or hypermetabolic supraclavicular or axillary lymph nodes. Incidental CT findings: Stable surgical changes from Fleming left upper lobe lobectomy. No findings suspicious for residual or recurrent lung cancer. Stable age advanced emphysematous changes  and pulmonary scarring. Stable significant age advanced aortic and three-vessel coronary artery calcifications. The right IJ Port-Fleming-Cath is in good position. ABDOMEN/PELVIS: No abnormal hypermetabolic activity within the liver, pancreas, adrenal glands, or spleen. No hypermetabolic lymph nodes in the abdomen or pelvis. Incidental CT findings: Age advanced vascular disease but no aneurysm. Fleming penile prosthesis is in place. 10 mm gallstone noted. SKELETON: No findings for osseous metastatic disease. Focus of hypermetabolism noted on the patient's skin and left groin area. This is probably urine contamination. No abnormality on the CT scan is identified. Incidental CT findings: None. IMPRESSION: 1. Hypermetabolic right-sided supraglottic mass consistent with the patient's known primary malignancy. 2. Hypermetabolic adjacent right level III lymph node consistent with  metastatic disease. 3. No findings for distant metastatic disease involving the chest, abdomen/pelvis or bony structures. 4. Stable surgical changes from Fleming left upper lobe lobectomy. No findings for residual or recurrent lung cancer. 5. Age advanced vascular disease. 6. Cholelithiasis. 7. Aortic atherosclerosis and emphysema. Aortic Atherosclerosis (ICD10-I70.0). Electronically Signed   By: Rudie Meyer M.D.   On: 07/13/2023 16:21   CT SOFT TISSUE NECK W CONTRAST  Result Date: 06/19/2023 CLINICAL DATA:  Massive oropharynx.  History of lung cancer. EXAM: CT NECK WITH CONTRAST TECHNIQUE: Multidetector CT imaging of the neck was performed using the standard protocol following the bolus administration of intravenous contrast. RADIATION DOSE REDUCTION: This exam was performed according to the departmental dose-optimization program which includes automated exposure control, adjustment of the mA and/or kV according to patient size and/or use of iterative reconstruction technique. CONTRAST:  75mL OMNIPAQUE IOHEXOL 300 MG/ML  SOLN COMPARISON:  CT chest 03/09/2023 FINDINGS: Pharynx and larynx: Approximately 2.4 x 1.6 x 2.4 cm enhancing right-sided supraglottic mass centered on the area epiglottic fold with inferior extension towards the false cord. Minimal extension across the midline posteriorly. Effacement of the right piriform sinus. Asymmetric soft tissue fullness involving the left lateral oropharynx/superior tonsillar region extending towards the posterior aspect of the left soft palate extending over Fleming craniocaudal length of 2-2.5 cm. Salivary glands: No inflammation, mass, or stone. Thyroid: Unremarkable. Lymph nodes: Partially cystic/necrotic right level III lymph node measuring 1.2 cm in short axis. No enlarged or suspicious lymph nodes in the left neck. Vascular: Right jugular Port-Fleming-Cath. Prominent atherosclerosis about the carotid bifurcations with associated stenoses inadequately assessed on this non  angiographic study. Limited intracranial: Unremarkable. Visualized orbits: Unremarkable. Mastoids and visualized paranasal sinuses: Mild mucosal thickening in the maxillary sinuses. Clear mastoid air cells. Skeleton: No suspicious osseous lesion. Asymmetrically advanced right facet arthrosis at C2-3. Upper chest: Emphysema. Left upper lobectomy. New 11 x 7 mm subsolid nodule in the right upper lobe (series 3, image 136). Additional smaller areas of peribronchovascular ground-glass density more anteriorly in the right upper lobe, with similar findings in the right lower lobe on the prior chest CT. Other: None. IMPRESSION: 1. 2.4 cm right supraglottic mass consistent with malignancy. 2. Pathologic right level III lymph node consistent with metastatic disease. 3. Asymmetric soft tissue in the left lateral upper oropharynx, indeterminate for Fleming second mucosal malignancy. Correlate with direct visualization. 4. New 11 mm subsolid pulmonary nodule in the right upper lobe with additional smaller peribronchovascular ground-glass densities elsewhere in the right upper lobe. As similar findings were present in the right lower lobe on prior chest CT, these are most likely infectious or inflammatory. However, attention is recommended on presumed follow-up chest CT or staging PET-CT to exclude primary lung malignancy or metastatic disease. Electronically Signed  By: Sebastian Ache M.D.   On: 06/19/2023 15:24      IMPRESSION/PLAN:  This is Fleming delightful patient with head and neck cancer. I *** recommend radiotherapy for this patient.  We discussed the potential risks, benefits, and side effects of radiotherapy. We talked in detail about acute and late effects. We discussed that some of the most bothersome acute effects may be mucositis, dysgeusia, salivary changes, skin irritation, hair loss, dehydration, weight loss and fatigue. We talked about late effects which include but are not necessarily limited to dysphagia,  hypothyroidism, nerve injury, vascular injury, spinal cord injury, xerostomia, trismus, neck edema, dental issues, non-healing wound, and potentially fatal injury to any of the tissues in the head and neck region. No guarantees of treatment were given. Fleming consent form was signed and placed in the patient's medical record. The patient is enthusiastic about proceeding with treatment. I look forward to participating in the patient's care.    Simulation (treatment planning) will take place ***  We also discussed that the treatment of head and neck cancer is Fleming multidisciplinary process to maximize treatment outcomes and quality of life. For this reason the following referrals have been or will be made:  *** Medical oncology to discuss chemotherapy   *** Dentistry for dental evaluation, possible extractions in the radiation fields, and /or advice on reducing risk of cavities, osteoradionecrosis, or other oral issues.  *** Nutritionist for nutrition support during and after treatment.  *** Speech language pathology for swallowing and/or speech therapy.  *** Social work for social support.   *** Physical therapy due to risk of lymphedema in neck and deconditioning.  *** Baseline labs including TSH.  On date of service, in total, I spent *** minutes on this encounter. Patient was seen in person.  __________________________________________   Lonie Peak, Fleming  This document serves as Fleming record of services personally performed by Lonie Peak, Fleming. It was created on her behalf by Neena Rhymes, Fleming trained medical scribe. The creation of this record is based on the scribe's personal observations and the provider's statements to them. This document has been checked and approved by the attending provider.

## 2023-07-19 ENCOUNTER — Inpatient Hospital Stay: Payer: Medicare HMO

## 2023-07-19 ENCOUNTER — Ambulatory Visit
Admission: RE | Admit: 2023-07-19 | Discharge: 2023-07-19 | Disposition: A | Discharge status: Home / Self Care | Payer: Medicare HMO | Source: Ambulatory Visit | Attending: Radiation Oncology | Admitting: Radiation Oncology

## 2023-07-19 ENCOUNTER — Inpatient Hospital Stay: Payer: Medicare HMO | Attending: Hematology | Admitting: Oncology

## 2023-07-19 ENCOUNTER — Encounter: Payer: Self-pay | Admitting: Radiation Oncology

## 2023-07-19 VITALS — BP 161/80 | HR 112 | Temp 97.8°F | Resp 20 | Ht 69.0 in

## 2023-07-19 VITALS — BP 139/75 | HR 118 | Temp 98.1°F | Resp 20 | Wt 138.8 lb

## 2023-07-19 DIAGNOSIS — C051 Malignant neoplasm of soft palate: Secondary | ICD-10-CM | POA: Insufficient documentation

## 2023-07-19 DIAGNOSIS — C328 Malignant neoplasm of overlapping sites of larynx: Secondary | ICD-10-CM | POA: Insufficient documentation

## 2023-07-19 DIAGNOSIS — Z85118 Personal history of other malignant neoplasm of bronchus and lung: Secondary | ICD-10-CM | POA: Insufficient documentation

## 2023-07-19 DIAGNOSIS — C321 Malignant neoplasm of supraglottis: Secondary | ICD-10-CM | POA: Insufficient documentation

## 2023-07-19 DIAGNOSIS — G4700 Insomnia, unspecified: Secondary | ICD-10-CM | POA: Insufficient documentation

## 2023-07-19 DIAGNOSIS — G893 Neoplasm related pain (acute) (chronic): Secondary | ICD-10-CM | POA: Diagnosis not present

## 2023-07-19 DIAGNOSIS — R1310 Dysphagia, unspecified: Secondary | ICD-10-CM | POA: Diagnosis not present

## 2023-07-19 DIAGNOSIS — F17218 Nicotine dependence, cigarettes, with other nicotine-induced disorders: Secondary | ICD-10-CM | POA: Insufficient documentation

## 2023-07-19 DIAGNOSIS — C3492 Malignant neoplasm of unspecified part of left bronchus or lung: Secondary | ICD-10-CM

## 2023-07-19 DIAGNOSIS — E876 Hypokalemia: Secondary | ICD-10-CM | POA: Diagnosis not present

## 2023-07-19 DIAGNOSIS — Z5111 Encounter for antineoplastic chemotherapy: Secondary | ICD-10-CM | POA: Diagnosis present

## 2023-07-19 DIAGNOSIS — Z79899 Other long term (current) drug therapy: Secondary | ICD-10-CM | POA: Insufficient documentation

## 2023-07-19 DIAGNOSIS — Z79630 Long term (current) use of alkylating agent: Secondary | ICD-10-CM | POA: Diagnosis not present

## 2023-07-19 LAB — CBC WITH DIFFERENTIAL/PLATELET
Abs Immature Granulocytes: 0.04 10*3/uL (ref 0.00–0.07)
Basophils Absolute: 0.1 10*3/uL (ref 0.0–0.1)
Basophils Relative: 1 %
Eosinophils Absolute: 0.1 10*3/uL (ref 0.0–0.5)
Eosinophils Relative: 1 %
HCT: 42.8 % (ref 39.0–52.0)
Hemoglobin: 14.4 g/dL (ref 13.0–17.0)
Immature Granulocytes: 0 %
Lymphocytes Relative: 22 %
Lymphs Abs: 2.2 10*3/uL (ref 0.7–4.0)
MCH: 32.4 pg (ref 26.0–34.0)
MCHC: 33.6 g/dL (ref 30.0–36.0)
MCV: 96.2 fL (ref 80.0–100.0)
Monocytes Absolute: 0.8 10*3/uL (ref 0.1–1.0)
Monocytes Relative: 8 %
Neutro Abs: 7 10*3/uL (ref 1.7–7.7)
Neutrophils Relative %: 68 %
Platelets: 261 10*3/uL (ref 150–400)
RBC: 4.45 MIL/uL (ref 4.22–5.81)
RDW: 14.2 % (ref 11.5–15.5)
WBC: 10.2 10*3/uL (ref 4.0–10.5)
nRBC: 0 % (ref 0.0–0.2)

## 2023-07-19 LAB — COMPREHENSIVE METABOLIC PANEL
ALT: 9 U/L (ref 0–44)
AST: 27 U/L (ref 15–41)
Albumin: 4.2 g/dL (ref 3.5–5.0)
Alkaline Phosphatase: 93 U/L (ref 38–126)
Anion gap: 8 (ref 5–15)
BUN: 10 mg/dL (ref 8–23)
CO2: 27 mmol/L (ref 22–32)
Calcium: 9.8 mg/dL (ref 8.9–10.3)
Chloride: 107 mmol/L (ref 98–111)
Creatinine, Ser: 0.57 mg/dL — ABNORMAL LOW (ref 0.61–1.24)
GFR, Estimated: >60 mL/min (ref >60)
Glucose, Bld: 100 mg/dL — ABNORMAL HIGH (ref 70–99)
Potassium: 4.4 mmol/L (ref 3.5–5.1)
Sodium: 142 mmol/L (ref 135–145)
Total Bilirubin: 0.4 mg/dL (ref <1.2)
Total Protein: 8.1 g/dL (ref 6.5–8.1)

## 2023-07-19 LAB — TSH: TSH: 0.701 u[IU]/mL (ref 0.350–4.500)

## 2023-07-19 MED ORDER — NICOTINE 14 MG/24HR TD PT24
14.0000 mg | MEDICATED_PATCH | Freq: Every day | TRANSDERMAL | 1 refills | Status: DC
Start: 2023-07-19 — End: 2023-09-01

## 2023-07-19 MED ORDER — OXYCODONE HCL 10 MG PO TABS: 10.0000 mg | ORAL_TABLET | ORAL | 0 refills | Status: DC | PRN

## 2023-07-19 NOTE — Progress Notes (Signed)
Oncology Nurse Navigator Documentation   Met with patient during initial consult with Dr. Basilio Cairo and Dr. Arlana Pouch. He was accompanied by his daughter.  Further introduced myself as his/their Navigator, explained my role as a member of the Care Team. Provided New Patient resource guide binder: Contact information for physicians, this navigator, other members of the Care Team Advance Directive information; provided [facility] AD booklet at their request,  Fall Prevention Patient Safety Plan Financial Assistance Information sheet Symptom Management Clinic information [facility] map with highlight of [facility] SLP Information sheet Head and Neck cancer basics Nutrition information Patient and family support information including Spiritual care/Chaplain information, Peer mentor program, health and wellness classes, and the survivorship program Community resources  Provided and discussed educational handouts for PEG. Assisted with post-consult appt scheduling. He verbalized understanding of information provided. I encouraged him to call with questions/concerns moving forward.  Hedda Slade, RN, BSN, OCN Head & Neck Oncology Nurse Navigator [facility] [place] [phone]

## 2023-07-20 ENCOUNTER — Telehealth: Payer: Self-pay | Admitting: Dietician

## 2023-07-20 ENCOUNTER — Telehealth (HOSPITAL_COMMUNITY): Payer: Self-pay

## 2023-07-20 ENCOUNTER — Other Ambulatory Visit: Payer: Self-pay

## 2023-07-20 DIAGNOSIS — C328 Malignant neoplasm of overlapping sites of larynx: Secondary | ICD-10-CM

## 2023-07-20 NOTE — Telephone Encounter (Signed)
-----   Message from Simonne Come sent at 07/20/2023  2:43 PM EST ----- Regarding: RE: Gastrostomy Tube Placement OK for G-tube placement.  Ideal for pt to drink barium night before if able.  PET CT - 07/13/23 - anatomy amenable.  Nedra Hai ----- Message ----- From: Sharee Pimple Sent: 07/20/2023  12:05 PM EST To: Ir Procedure Requests Subject: Gastrostomy Tube Placement                     Procedure: Gastrostomy tube placement  Ordering: Dr. 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   Thanks,  Fara Boros

## 2023-07-21 ENCOUNTER — Ambulatory Visit
Admission: RE | Admit: 2023-07-21 | Discharge: 2023-07-21 | Disposition: A | Discharge status: Home / Self Care | Payer: Medicare HMO | Source: Ambulatory Visit | Attending: Radiation Oncology

## 2023-07-21 ENCOUNTER — Ambulatory Visit
Admission: RE | Admit: 2023-07-21 | Discharge: 2023-07-21 | Disposition: A | Discharge status: Home / Self Care | Payer: Medicare HMO | Source: Ambulatory Visit | Attending: Radiation Oncology | Admitting: Radiation Oncology

## 2023-07-21 ENCOUNTER — Encounter: Payer: Self-pay | Admitting: Oncology

## 2023-07-21 VITALS — BP 140/75 | HR 119 | Temp 97.3°F | Resp 20 | Ht 69.0 in | Wt 142.0 lb

## 2023-07-21 DIAGNOSIS — C328 Malignant neoplasm of overlapping sites of larynx: Secondary | ICD-10-CM | POA: Insufficient documentation

## 2023-07-21 DIAGNOSIS — Z95828 Presence of other vascular implants and grafts: Secondary | ICD-10-CM

## 2023-07-21 DIAGNOSIS — Z510 Encounter for antineoplastic radiation therapy: Secondary | ICD-10-CM | POA: Insufficient documentation

## 2023-07-21 DIAGNOSIS — C051 Malignant neoplasm of soft palate: Secondary | ICD-10-CM | POA: Diagnosis not present

## 2023-07-21 DIAGNOSIS — C321 Malignant neoplasm of supraglottis: Secondary | ICD-10-CM | POA: Diagnosis not present

## 2023-07-21 MED ORDER — SODIUM CHLORIDE 0.9% FLUSH
10.0000 mL | Freq: Once | INTRAVENOUS | Status: AC
  Administered 2023-07-21: 10 mL via INTRAVENOUS

## 2023-07-21 MED ORDER — DEXAMETHASONE 4 MG PO TABS: ORAL_TABLET | ORAL | 1 refills | Status: DC

## 2023-07-21 MED ORDER — PROCHLORPERAZINE MALEATE 10 MG PO TABS: 10.0000 mg | ORAL_TABLET | Freq: Four times a day (QID) | ORAL | 1 refills | Status: AC | PRN

## 2023-07-21 MED ORDER — ONDANSETRON HCL 8 MG PO TABS: 8.0000 mg | ORAL_TABLET | Freq: Three times a day (TID) | ORAL | 1 refills | Status: AC | PRN

## 2023-07-21 MED ORDER — LIDOCAINE-PRILOCAINE 2.5-2.5 % EX CREA: TOPICAL_CREAM | CUTANEOUS | 3 refills | Status: AC

## 2023-07-21 MED ORDER — HEPARIN SOD (PORK) LOCK FLUSH 100 UNIT/ML IV SOLN
500.0000 [IU] | Freq: Once | INTRAVENOUS | Status: AC
  Administered 2023-07-21: 500 [IU] via INTRAVENOUS

## 2023-07-21 NOTE — Progress Notes (Signed)
Has armband been applied?  Yes.    Does patient have an allergy to IV contrast dye?: No.   Has patient ever received premedication for IV contrast dye?: No.   Date of lab work: July 19, 2023 BUN: 10 CR: 0.57 eGFR: >60  Does patient take metformin?: No.  Is eGFR >60?: Yes.   If no, when can patient resume? (Must be 48 hrs AFTER they receive IV contrast):  N/A  IV site: subclavian right, condition patent and no redness  Has IV site been added to flowsheet?  Yes.    BP (!) 140/75 (BP Location: Left Arm, Patient Position: Sitting)   Pulse (!) 119   Temp (!) 97.3 F (36.3 C)   Resp 20   Ht 5\' 9"  (1.753 m)   Wt 142 lb (64.4 kg)   SpO2 98%   BMI 20.97 kg/m

## 2023-07-21 NOTE — Progress Notes (Signed)
Oncology Nurse Navigator Documentation   To provide support, encouragement and care continuity, met with [name] after his CT SIM. He tolerated procedure without difficulty, denied questions/concerns.   I encouraged him to call me prior to his 12/23 [facility].   I also met with [name] to provide PEG education prior to 12/19 placement.   Using  PEG teaching device   and Teach Back, provided education for PEG use and care, including: hand hygiene, gravity bolus administration of daily water flushes and nutritional supplement, fluids and medications; care of tube insertion site including daily dressing change and cleaning; S&S of infection.   [name] correctly verbalized procedures for and provided correct return demonstration of gravity administration of water, dressing change and site care.  I provided written instructions for PEG flushing/dressing change in support of verbal instruction.   I provided/described contents of Start of Care Bolus Feeding Kit (2 60 cc syringes, 1 box 4x4 drainage sponges, 1 package mesh briefs, 1 roll paper tape, 1 case Osmolite 1.5).  He voiced understanding he is to start using Osmolite per guidance of Nutrition. He understands I will be available for ongoing PEG support. Provided barium sulfate prep which I obtained from [facility] and reviewed instructions.    Hedda Slade RN, BSN, OCN Head & Neck Oncology Nurse Navigator [facility] Phone # [phone]  Fax # [phone]

## 2023-07-22 ENCOUNTER — Other Ambulatory Visit: Payer: Self-pay

## 2023-07-24 ENCOUNTER — Inpatient Hospital Stay: Payer: Medicare HMO

## 2023-07-24 ENCOUNTER — Encounter: Payer: Self-pay | Admitting: Oncology

## 2023-07-24 DIAGNOSIS — C051 Malignant neoplasm of soft palate: Secondary | ICD-10-CM

## 2023-07-24 DIAGNOSIS — G893 Neoplasm related pain (acute) (chronic): Secondary | ICD-10-CM | POA: Insufficient documentation

## 2023-07-24 NOTE — Assessment & Plan Note (Signed)
-   Refilled oxycodone and educated on judicial use.

## 2023-07-24 NOTE — Assessment & Plan Note (Signed)
Reports smoking a pack a day. Noted wheezing on both sides. -Prescribed nicotine patch and encourage smoking cessation for overall health and cancer prevention.

## 2023-07-24 NOTE — Assessment & Plan Note (Signed)
-   Please see HPI above for details and past treatments at [facility], under the direction of Dr. Ellin Saba.  -Remains in clinical remission.  Recent PET/CT showed no evidence of disease recurrence as far as lung cancer is concerned.  Will continue surveillance for now.

## 2023-07-24 NOTE — Assessment & Plan Note (Signed)
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  Noted changes in voice and difficulty swallowing solid foods. No signs of lung cancer recurrence on PET scan.   He seems to have diffuse disease throughout much of his oropharynx.  This is not appearing obviously on his PET scan.  He also has right supraglottic cancer and a hypermetabolic right level 3 lymph node consistent with lymphatic spread from his head and neck primaries.  He is less likely to be a surgical candidate given the extent of his disease.  Hence recommendation made to proceed with concurrent chemoradiation and patient and his daughter are agreeable with this plan.  -We have discussed about role of cisplatin being a radiosensitizer in the treatment of head and neck cancer.  We have discussed about the curative intent of chemoradiation for this patient.     We have discussed about mechanism of action of cisplatin, adverse effects of cisplatin including but not limited to fatigue, nausea, vomiting, increased risk of infections, mucositis, ototoxicity, nephrotoxicity, peripheral neuropathy.  Patient understands that some of the side effects can be permanent and even potentially fatal.  We have discussed about role of Mediport and G-tube for chemotherapy administration and nutrition respectively since most of these patients have severe mucositis during the treatment.  At this time we do not know if weekly cisplatin is inferior to every 21 days cisplatin since there is no head-to-head comparison trial.  We are awaiting results from NRG trial.  I did mention to the patient however weekly cisplatin is well-tolerated with less adverse effects and most of the patients tend to complete treatment as planned.  Patient is willing to proceed with weekly cisplatin.  -Plan for feeding tube placement to maintain hydration and nutrition during treatment due to expected mouth soreness and difficulty  swallowing.  -He is scheduled to see Dr. Basilio Cairo later today.  I will plan to bring him back to clinic during the same week that he starts radiation treatments.  Will arrange chemo education in the interim.

## 2023-07-25 ENCOUNTER — Other Ambulatory Visit: Payer: Self-pay

## 2023-07-25 ENCOUNTER — Inpatient Hospital Stay: Payer: Medicare HMO

## 2023-07-25 ENCOUNTER — Telehealth: Payer: Self-pay | Admitting: Internal Medicine

## 2023-07-25 ENCOUNTER — Inpatient Hospital Stay: Payer: Medicare HMO | Admitting: Dietician

## 2023-07-25 DIAGNOSIS — C328 Malignant neoplasm of overlapping sites of larynx: Secondary | ICD-10-CM

## 2023-07-25 DIAGNOSIS — C321 Malignant neoplasm of supraglottis: Secondary | ICD-10-CM

## 2023-07-25 NOTE — Telephone Encounter (Signed)
Called and spoke  patient and scheduled future appts for patient. Confirmed all scheduled appts.Bryan KitchenMarland Fleming

## 2023-07-25 NOTE — Progress Notes (Signed)
[facility] Clinical Social Work  Initial Assessment   Bryan Fleming is a 62 y.o. year old male contacted by phone. Clinical Social Work was referred by new patient protocol for assessment of psychosocial needs.   SDOH (Social Determinants of Health) assessments performed: No   SDOH Screenings   Food Insecurity: Food Insecurity Present (07/19/2023)  Housing: Medium Risk (07/19/2023)  Transportation Needs: No Transportation Needs (07/19/2023)  Utilities: Not At Risk (07/19/2023)  Depression (PHQ2-9): Low Risk  (12/03/2020)  Physical Activity: Inactive (12/03/2020)  Tobacco Use: High Risk (07/19/2023)     Distress Screen completed: No     No data to display            Family/Social Information:  Housing Arrangement: patient lives with his spouse. Family members/support persons in your life? Family Transportation concerns: Patient denied any transportation concerns. Patient reports he will drive himself as long as possible. If patient is unable to drive himself, patient's brother or family members will step in. Patient is transporting from [place], [place] and expressed concern about cost of transportation.  Employment: Disabled patient reported being disable for approx five years.  Income source: Social Security Disability Financial concerns:  Patient is on a limited income and concerned about cost of treatment.  Type of concern: Transportation and Medical bills Food access concerns: yes, due to limited income and limited SNAP benefits.  Religious or spiritual practice: Yes-patient reported that he is christian and denied any spiritual distress with diagnosis. Services Currently in place:  Insurance, Family, Income  Coping/ Adjustment to diagnosis: Patient understands treatment plan and what happens next? yes Concerns about diagnosis and/or treatment: How I will pay for the services I need and success of treatment Patient reported stressors: Adjusting to my illness Hopes  and/or priorities: Patient is hoping that treatment will be successful, and is anxious to get started. 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  Clinical Social Work Clinical Goal(s):  Explore community resource options for unmet needs related to:  Insurance risk surveyor Strain   Interventions: Discussed common feeling and emotions when being diagnosed with cancer, and the importance of support during treatment Informed patient of the support team roles and support services at [facility] Provided CSW contact information and encouraged patient to call with any questions or concerns CSW sent in referral to patient financial specialist Everlean Alstrom for the [employer]. Patient meets presumptive eligibility (SNAP benefits for the application). CSW sent in referral for [facility]. Cancer Organization Marijean Niemann   Follow Up Plan: Patient will contact CSW with any support or resource needs Patient verbalizes understanding of plan: Yes  Marguerita Merles, LCSW Clinical Social Worker [facility]

## 2023-07-25 NOTE — Progress Notes (Signed)
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  He has PMHx that includes: Lung cancer s/p neoadjuvant chemo immunotherapy followed by left upper lobectomy in September 2022, HTN, COPD, GERD, hepatitis, alcohol abuse.  His treatment plan includes PEG placement planned for 12/19 followed by concurrent chemo radiation and he will be followed by [facility] team of Drs. [name] and Squire. Called patient at home/mobile telephone # wife was present during call. he reports tolerating some soft foods currently. Odynophagia with pain 8 out of 10 with solids and tolerating liquids at this time.  He's been trying to eat softer foods.  This morning he had a boiled egg and jello, nothing yet for lunch yesterday had bologna sandwich, soups, chicken He reports he was given some Ensure Samples, likes the vanilla but it is cost prohibitive for him to continue.  He also reports drinking apple juice, some water, Gatorade, and continues with beer (60oz).    Medications: not taking MVI   Labs: reviewed 07/19/23     Anthropometrics:  weight has fluctuated past month.  Down 14# (9%) one month from October through November. Significant for timeframe.   Height: 5'9" Weight:  07/21/23  142# 06/27/23  141# 05/15/23  155# UBW: 152-155 #  BMI: 20.97   Estimated Energy Needs  Kcals: 2000-2300 Protein: 78-98 g Fluid: 2 L   NUTRITION DIAGNOSIS: Sub optimal oral intake related to social/environmental status as evidenced by pt reported daily EtOH consumption.  Continues   INTERVENTION:  Relayed that cancer center is unable to provide pt with cases of Ensure.  Encouraged home blended high protein shakes instead of beer to improve nutrient intake.  Encouraged soft moist foods or blended soups and  more frequently feeds.   Relayed that RD would be visiting  him during his infusion next week to demonstrate how to use his PEG for bolus feeds.  Emailed soft moist protein list and shake recipes to daughters email (also sent to [email or web address] as patient was unable to confirm email address.   Next Visit:  RD at [facility] with do in-person evaluation next week.  Bryan Fleming, RDN, LDN Registered Dietitian, [facility] Part Time Remote (Usual office hours: Tuesday-Thursday) Mobile: [phone] Remote Office: [phone]

## 2023-07-26 ENCOUNTER — Other Ambulatory Visit: Payer: Self-pay | Admitting: Radiology

## 2023-07-26 DIAGNOSIS — C3492 Malignant neoplasm of unspecified part of left bronchus or lung: Secondary | ICD-10-CM

## 2023-07-27 ENCOUNTER — Other Ambulatory Visit: Payer: Self-pay

## 2023-07-27 ENCOUNTER — Other Ambulatory Visit: Payer: Medicare HMO

## 2023-07-27 ENCOUNTER — Ambulatory Visit: Payer: Medicare HMO | Admitting: Oncology

## 2023-07-27 ENCOUNTER — Ambulatory Visit (HOSPITAL_COMMUNITY)
Admission: RE | Admit: 2023-07-27 | Discharge: 2023-07-27 | Disposition: A | Discharge status: Home / Self Care | Payer: Medicare HMO | Source: Ambulatory Visit | Attending: Oncology | Admitting: Oncology

## 2023-07-27 ENCOUNTER — Encounter (HOSPITAL_COMMUNITY): Payer: Self-pay

## 2023-07-27 DIAGNOSIS — C3492 Malignant neoplasm of unspecified part of left bronchus or lung: Secondary | ICD-10-CM

## 2023-07-27 DIAGNOSIS — E785 Hyperlipidemia, unspecified: Secondary | ICD-10-CM | POA: Insufficient documentation

## 2023-07-27 DIAGNOSIS — I10 Essential (primary) hypertension: Secondary | ICD-10-CM | POA: Insufficient documentation

## 2023-07-27 DIAGNOSIS — Z85118 Personal history of other malignant neoplasm of bronchus and lung: Secondary | ICD-10-CM | POA: Insufficient documentation

## 2023-07-27 DIAGNOSIS — Z9221 Personal history of antineoplastic chemotherapy: Secondary | ICD-10-CM | POA: Insufficient documentation

## 2023-07-27 DIAGNOSIS — F17210 Nicotine dependence, cigarettes, uncomplicated: Secondary | ICD-10-CM | POA: Insufficient documentation

## 2023-07-27 DIAGNOSIS — J449 Chronic obstructive pulmonary disease, unspecified: Secondary | ICD-10-CM | POA: Insufficient documentation

## 2023-07-27 DIAGNOSIS — Z431 Encounter for attention to gastrostomy: Secondary | ICD-10-CM | POA: Diagnosis not present

## 2023-07-27 DIAGNOSIS — K219 Gastro-esophageal reflux disease without esophagitis: Secondary | ICD-10-CM | POA: Diagnosis not present

## 2023-07-27 DIAGNOSIS — C760 Malignant neoplasm of head, face and neck: Secondary | ICD-10-CM | POA: Diagnosis not present

## 2023-07-27 DIAGNOSIS — C328 Malignant neoplasm of overlapping sites of larynx: Secondary | ICD-10-CM | POA: Insufficient documentation

## 2023-07-27 HISTORY — PX: IR GASTROSTOMY TUBE MOD SED: IMG625

## 2023-07-27 LAB — PROTIME-INR
INR: 1 (ref 0.8–1.2)
Prothrombin Time: 13.6 s (ref 11.4–15.2)

## 2023-07-27 LAB — CBC
HCT: 40.9 % (ref 39.0–52.0)
Hemoglobin: 13.9 g/dL (ref 13.0–17.0)
MCH: 32.9 pg (ref 26.0–34.0)
MCHC: 34 g/dL (ref 30.0–36.0)
MCV: 96.7 fL (ref 80.0–100.0)
Platelets: 243 10*3/uL (ref 150–400)
RBC: 4.23 MIL/uL (ref 4.22–5.81)
RDW: 13.9 % (ref 11.5–15.5)
WBC: 12.8 10*3/uL — ABNORMAL HIGH (ref 4.0–10.5)
nRBC: 0 % (ref 0.0–0.2)

## 2023-07-27 MED ORDER — CEFAZOLIN SODIUM-DEXTROSE 2-4 GM/100ML-% IV SOLN
INTRAVENOUS | Status: AC
  Filled 2023-07-27: qty 100

## 2023-07-27 MED ORDER — LIDOCAINE-EPINEPHRINE 1 %-1:100000 IJ SOLN
INTRAMUSCULAR | Status: AC
  Filled 2023-07-27: qty 1

## 2023-07-27 MED ORDER — FENTANYL CITRATE (PF) 100 MCG/2ML IJ SOLN
INTRAMUSCULAR | Status: AC
  Filled 2023-07-27: qty 2

## 2023-07-27 MED ORDER — MIDAZOLAM HCL 2 MG/2ML IJ SOLN
INTRAMUSCULAR | Status: AC | PRN
  Administered 2023-07-27: 1 mg via INTRAVENOUS

## 2023-07-27 MED ORDER — LIDOCAINE-EPINEPHRINE 1 %-1:100000 IJ SOLN: 20.0000 mL | Freq: Once | INTRAMUSCULAR | Status: DC

## 2023-07-27 MED ORDER — IOHEXOL 300 MG/ML  SOLN: 50.0000 mL | Freq: Once | INTRAMUSCULAR | Status: DC | PRN

## 2023-07-27 MED ORDER — CEFAZOLIN SODIUM-DEXTROSE 2-4 GM/100ML-% IV SOLN
2.0000 g | Freq: Once | INTRAVENOUS | Status: AC
  Administered 2023-07-27: 2 g via INTRAVENOUS

## 2023-07-27 MED ORDER — FENTANYL CITRATE (PF) 100 MCG/2ML IJ SOLN
INTRAMUSCULAR | Status: AC | PRN
  Administered 2023-07-27: 50 ug via INTRAVENOUS

## 2023-07-27 MED ORDER — MIDAZOLAM HCL 2 MG/2ML IJ SOLN
INTRAMUSCULAR | Status: AC
  Filled 2023-07-27: qty 2

## 2023-07-27 NOTE — H&P (Signed)
Chief Complaint: Patient was seen in consultation today for gastrostomy tube placement.   at the request of Bryan Fleming  Referring Physician(s): Bryan Fleming  Supervising Physician: Marliss Coots  Patient Status: [facility] - Out-pt  Full Code  History of Present Illness: Bryan Fleming is a 62 y.o. male with history of of SCC left lung-received chemotherapy 2022 and upper lobectomy, tobacco abuse, COPD, GERD, HTN, HLD, ETOH hepatitis, and newly diagnosed squamous cell carcinoma of the right supraglottis with nodal involvement. CT scan 06/19/23 neck reported a 2.4 cm right supraglottic mass consistent with malignancy. There was also a pathologic right level III lymph node consistent with metastatic disease. Patient underwent a microlaryngoscopy and biopsy 07/04/23, which confirmed invasive squamous cell carcinoma.  Patient is currently being followed by Dr. Meryl Fleming. Patient was referred for a gastrostomy tube to assist with nutrition due to chemotherapy side effects. Patient reports being here today for a tube placement to assist with feedings. Patient reports feeling well today.       Past Medical History:  Diagnosis Date   Cancer [facility])    Lung cancer   COPD (chronic obstructive pulmonary disease) (HCC) 2007   EMPHYSEMA   Depression    Dyspnea    occasional - no oxygen   GERD (gastroesophageal reflux disease) 2010   Hepatitis    Alcoholic    HLD (hyperlipidemia)    no meds   Hypertension    Port-A-Cath in place 01/01/2021    Past Surgical History:  Procedure Laterality Date   BIOPSY  05/15/2023   Procedure: BIOPSY;  Surgeon: Bryan Lofty., Fleming;  Location: [facility] ENDOSCOPY;  Service: Gastroenterology;;   BRONCHIAL BIOPSY  12/23/2020   Procedure: BRONCHIAL BIOPSIES;  Surgeon: Bryan Fleming;  Location: [facility] ENDOSCOPY;  Service: Pulmonary;;   BRONCHIAL BRUSHINGS  12/23/2020   Procedure: BRONCHIAL BRUSHINGS;  Surgeon: Bryan Fleming;  Location:  [facility] ENDOSCOPY;  Service: Pulmonary;;   BRONCHIAL NEEDLE ASPIRATION BIOPSY  12/23/2020   Procedure: BRONCHIAL NEEDLE ASPIRATION BIOPSIES;  Surgeon: Bryan Fleming;  Location: [facility] ENDOSCOPY;  Service: Pulmonary;;   BRONCHIAL WASHINGS  12/23/2020   Procedure: BRONCHIAL WASHINGS;  Surgeon: Bryan Fleming;  Location: [facility] ENDOSCOPY;  Service: Pulmonary;;   CARDIAC CATHETERIZATION  2010   COLONOSCOPY  03/14/2012   Procedure: COLONOSCOPY;  Surgeon: Bryan Fleming;  [facility]: [facility] ENDO SUITE;  Service: Endoscopy;  Laterality: N/A;  100   ESOPHAGOGASTRODUODENOSCOPY (EGD) WITH PROPOFOL N/A 05/15/2023   Procedure: ESOPHAGOGASTRODUODENOSCOPY (EGD) WITH PROPOFOL;  Surgeon: Meridee Score Netty Starring., Fleming;  Location: [facility] ENDOSCOPY;  Service: Gastroenterology;  Laterality: N/A;   EUS N/A 05/15/2023   Procedure: UPPER ENDOSCOPIC ULTRASOUND (EUS) RADIAL;  Surgeon: Bryan Lofty., Fleming;  Location: [facility] ENDOSCOPY;  Service: Gastroenterology;  Laterality: N/A;   INTERCOSTAL NERVE BLOCK  04/21/2021   Procedure: INTERCOSTAL NERVE BLOCK;  Surgeon: Loreli Slot, Fleming;  Location: [facility] OR;  Service: Thoracic;;   IR IMAGING GUIDED PORT INSERTION  01/12/2021   LARYNGOSCOPY N/A 07/04/2023   Procedure: LARYNGOSCOPY WITH BIOPSIES;  Surgeon: Laren Boom, DO;  Location: [facility];  Service: ENT;  Laterality: N/A;   LUNG REMOVAL, PARTIAL  08/2021   PENILE PROSTHESIS IMPLANT N/A 02/17/2022   Procedure: PENILE PROTHESIS INFLATABLE;  Surgeon: Bryan Fleming;  Location: [facility] ORS;  Service: Urology;  Laterality: N/A;   PORTACATH PLACEMENT  07/2021   REMOVAL OF PENILE PROSTHESIS N/A 10/13/2022   Procedure: REMOVAL OF PENILE PROSTHESIS;  Surgeon:  Bryan Fleming;  Location: [facility] ORS;  Service: Urology;  Laterality: N/A;   REMOVAL OF PENILE PROSTHESIS N/A 12/05/2022   Procedure: REMOVAL AND REPLACEMENT OF PENILE PROSTHESIS;  Surgeon: Bryan Fleming;  Location: [facility] ORS;  Service:  Urology;  Laterality: N/A;  120 MINUTES NEEDED FOR CASE   VIDEO ASSISTED THORACOSCOPY (VATS)/ LOBECTOMY Left 04/21/2021   Procedure: VIDEO ASSISTED THORACOSCOPY (VATS)/ LOBECTOMY;  Surgeon: Loreli Slot, Fleming;  Location: [facility] OR;  Service: Thoracic;  Laterality: Left;   VIDEO BRONCHOSCOPY WITH ENDOBRONCHIAL NAVIGATION N/A 12/23/2020   Procedure: VIDEO BRONCHOSCOPY WITH ENDOBRONCHIAL NAVIGATION;  Surgeon: Bryan Fleming;  Location: [facility] ENDOSCOPY;  Service: Pulmonary;  Laterality: N/A;   WRIST GANGLION EXCISION  1985    Allergies: Patient has no known allergies.  Medications: Prior to Admission medications   Medication Sig Start Date End Date Taking? Authorizing Provider  acetaminophen (TYLENOL) 500 MG tablet Take 2 tablets (1,000 mg total) by mouth every 6 (six) hours. 12/06/22  Yes Bryan Fleming  albuterol (VENTOLIN HFA) 108 (90 Base) MCG/ACT inhaler Inhale 1-2 puffs into the lungs every 6 (six) hours as needed for wheezing or shortness of breath.   Yes Provider, Historical, Fleming  amLODipine (NORVASC) 5 MG tablet Take 5 mg by mouth in the morning. 04/16/20  Yes Provider, Historical, Fleming  Fluticasone-Umeclidin-Vilant (TRELEGY ELLIPTA) 100-62.5-25 MCG/INH AEPB Inhale 1 puff into the lungs in the morning.   Yes Provider, Historical, Fleming  oxyCODONE 10 MG TABS Take 1 tablet (10 mg total) by mouth every 4 (four) hours as needed for severe pain (pain score 7-10). 07/19/23  Yes [name], [name], Fleming  celecoxib (CELEBREX) 200 MG capsule Take 1 capsule (200 mg total) by mouth 2 (two) times daily. Patient not taking: Reported on 06/27/2023 12/06/22   Bryan Fleming  dexamethasone (DECADRON) 4 MG tablet Take 2 tablets (8 mg) by mouth daily x 3 days starting the day after cisplatin chemotherapy. Take with food. 07/21/23   [name], Bryan Fleming  gabapentin (NEURONTIN) 300 MG capsule Take 300 mg by mouth 3 (three) times daily. Patient not taking: Reported on 06/27/2023 01/02/23   Provider, Historical,  Fleming  lidocaine-prilocaine (EMLA) cream Apply to affected area once 07/21/23   [name], [name], Fleming  Multiple Vitamin (MULTIVITAMIN WITH MINERALS) TABS tablet Take 1 tablet by mouth in the morning. Patient not taking: Reported on 07/19/2023    Provider, Historical, Fleming  nicotine (NICODERM CQ) 14 mg/24hr patch Place 1 patch (14 mg total) onto the skin daily. Patient not taking: Reported on 07/19/2023 07/19/23   [name], Bryan Fleming  omeprazole (PRILOSEC) 40 MG capsule Take 1 capsule (40 mg total) by mouth 2 (two) times daily. Patient not taking: Reported on 07/19/2023 05/15/23   Mansouraty, Netty Starring., Fleming  ondansetron (ZOFRAN) 8 MG tablet Take 1 tablet (8 mg total) by mouth every 8 (eight) hours as needed for nausea or vomiting. Start on the third day after cisplatin. 07/21/23   [name], Bryan Fleming  prochlorperazine (COMPAZINE) 10 MG tablet Take 1 tablet (10 mg total) by mouth every 6 (six) hours as needed (Nausea or vomiting). 07/21/23   Bryan Crutch, Fleming     Family History  Problem Relation Age of Onset   Hypertension Mother    Asthma Father    Diabetes Sister    Hypertension Sister    Hypertension Brother    Hypertension Sister    Lupus Daughter     Social History   Socioeconomic History  Marital status: Married    Spouse name: Not on file   Number of children: Not on file   Years of education: Not on file   Highest education level: Not on file  Occupational History   Not on file  Tobacco Use   Smoking status: Every Day    Current packs/day: 1.00    Average packs/day: 1 pack/day for 37.0 years (37.0 ttl pk-yrs)    Types: Cigarettes   Smokeless tobacco: Never   Tobacco comments:    smokes 1/2 pack a day [facility] 03/08/21  Vaping Use   Vaping status: Never Used  Substance and Sexual Activity   Alcohol use: Yes    Alcohol/week: 56.0 standard drinks of alcohol    Types: 35 Cans of beer, 21 Shots of liquor per week    Comment: 5-6 beers and 2-3 shots daily-04/19/21   Drug use: No    Sexual activity: Yes  Other Topics Concern   Not on file  Social History Narrative   Not on file   Social Drivers of Health   Financial Resource Strain: Not on file  Food Insecurity: Food Insecurity Present (07/19/2023)   Hunger Vital Sign    Worried About Running Out of Food in the Last Year: Sometimes true    Ran Out of Food in the Last Year: Sometimes true  Transportation Needs: No Transportation Needs (07/19/2023)   PRAPARE - Administrator, Civil Service (Medical): No    Lack of Transportation (Non-Medical): No  Physical Activity: Inactive (12/03/2020)   Exercise Vital Sign    Days of Exercise per Week: 0 days    Minutes of Exercise per Session: 0 min  Stress: Not on file  Social Connections: Not on file    Review of Systems: A 12 point ROS discussed and pertinent positives are indicated in the HPI above.  All other systems are negative.  Review of Systems  Constitutional:  Negative for fever.  Respiratory:  Negative for shortness of breath.   Cardiovascular:  Negative for chest pain.  Gastrointestinal:  Negative for abdominal pain, diarrhea, nausea and vomiting.  Psychiatric/Behavioral:  Negative for confusion.     Vital Signs: BP 139/65 (BP Location: Right Arm)   Pulse (!) 114   Temp 98 F (36.7 C) (Oral)   Resp 16   Ht 5\' 9"  (1.753 m)   Wt 141 lb (64 kg)   BMI 20.82 kg/m     Physical Exam HENT:     Head: Normocephalic.     Mouth/Throat:     Mouth: Mucous membranes are moist.     Pharynx: Oropharynx is clear.  Cardiovascular:     Rate and Rhythm: Normal rate and regular rhythm.  Pulmonary:     Effort: Pulmonary effort is normal.     Breath sounds: Normal breath sounds.  Abdominal:     General: Abdomen is flat.     Palpations: Abdomen is soft.     Tenderness: There is no abdominal tenderness.  Skin:    General: Skin is warm.  Neurological:     Mental Status: He is alert and oriented to person, place, and time.  Psychiatric:         Thought Content: Thought content normal.        Judgment: Judgment normal.     Imaging: NM PET Image Initial (PI) Skull Base To Thigh (F-18 FDG) Result Date: 07/13/2023 CLINICAL DATA:  Initial treatment strategy for squamous cell carcinoma of the larynx. History of non-small  cell lung cancer status post left upper lobe lobectomy. EXAM: NUCLEAR MEDICINE PET SKULL BASE TO THIGH TECHNIQUE: 6.17 mCi F-18 FDG was injected intravenously. Full-ring PET imaging was performed from the skull base to thigh after the radiotracer. CT data was obtained and used for attenuation correction and anatomic localization. Fasting blood glucose: 107 mg/dl COMPARISON:  Prior PET-CT 12/14/2020 and neck CT 06/19/2023 FINDINGS: Mediastinal blood pool activity: SUV max 1.41 Liver activity: SUV max NA NECK: The right-sided supraglottic mass is hypermetabolic with SUV max of 8.04. The adjacent enlarged right level III lymph node is hypermetabolic with SUV max of 8.93. No other enlarged or hypermetabolic neck nodes are identified. Incidental CT findings: Age advanced bilateral carotid artery calcifications. CHEST: No hypermetabolic mediastinal or hilar nodes. No suspicious pulmonary nodules on the CT scan. No enlarged or hypermetabolic supraclavicular or axillary lymph nodes. Incidental CT findings: Stable surgical changes from a left upper lobe lobectomy. No findings suspicious for residual or recurrent lung cancer. Stable age advanced emphysematous changes and pulmonary scarring. Stable significant age advanced aortic and three-vessel coronary artery calcifications. The right IJ Port-A-Cath is in good position. ABDOMEN/PELVIS: No abnormal hypermetabolic activity within the liver, pancreas, adrenal glands, or spleen. No hypermetabolic lymph nodes in the abdomen or pelvis. Incidental CT findings: Age advanced vascular disease but no aneurysm. A penile prosthesis is in place. 10 mm gallstone noted. SKELETON: No findings for osseous  metastatic disease. Focus of hypermetabolism noted on the patient's skin and left groin area. This is probably urine contamination. No abnormality on the CT scan is identified. Incidental CT findings: None. IMPRESSION: 1. Hypermetabolic right-sided supraglottic mass consistent with the patient's known primary malignancy. 2. Hypermetabolic adjacent right level III lymph node consistent with metastatic disease. 3. No findings for distant metastatic disease involving the chest, abdomen/pelvis or bony structures. 4. Stable surgical changes from a left upper lobe lobectomy. No findings for residual or recurrent lung cancer. 5. Age advanced vascular disease. 6. Cholelithiasis. 7. Aortic atherosclerosis and emphysema. Aortic Atherosclerosis (ICD10-I70.0). Electronically Signed   By: Rudie Meyer M.D.   On: 07/13/2023 16:21    Labs:  CBC: Recent Labs    12/06/22 0700 03/09/23 1208 07/19/23 1216 07/27/23 1101  WBC 10.3 7.9 10.2 12.8*  HGB 13.0 14.8 14.4 13.9  HCT 40.4 44.3 42.8 40.9  PLT 212 291 261 243    COAGS: Recent Labs    07/27/23 1101  INR 1.0    BMP: Recent Labs    12/05/22 0436 12/06/22 0441 12/06/22 0700 03/09/23 1208 07/19/23 1216  NA 134*  --  136 139 142  K 3.8  --  3.5 3.4* 4.4  CL 101  --  103 105 107  CO2 23  --  23 24 27   GLUCOSE 121*  --  165* 102* 100*  BUN 10  --  13 7 10   CALCIUM 9.3  --  9.0 8.7* 9.8  CREATININE 0.73 0.93 0.93 0.62 0.57*  GFRNONAA >60 >60 >60 >60 >60    LIVER FUNCTION TESTS: Recent Labs    08/31/22 1400 03/09/23 1208 07/19/23 1216  BILITOT 0.6 0.4 0.4  AST 34 37 27  ALT 18 18 9   ALKPHOS 85 90 93  PROT 8.0 8.0 8.1  ALBUMIN 4.0 3.7 4.2    TUMOR MARKERS: No results for input(s): "AFPTM", "CEA", "CA199", "CHROMGRNA" in the last 8760 hours.  Assessment and Plan:  Patient is scheduled today for a gastrostomy tube placement in the IR suite.   Risks and benefits  image guided gastrostomy tube placement was discussed with the  patient including, but not limited to the need for a barium enema during the procedure, bleeding, infection, peritonitis and/or damage to adjacent structures.  All of the patient's questions were answered, patient is agreeable to proceed.  Consent signed and in chart.   Thank you for this interesting consult.  I greatly enjoyed meeting BRADLEIGH DELLY and look forward to participating in their care.  A copy of this report was sent to the requesting provider on this date.  Electronically Signed: Rosalita Levan, PA 07/27/2023, 12:20 PM   I spent a total of  25 Minutes in face to face in clinical consultation, greater than 50% of which was counseling/coordinating care for gastrostomy tube placement.

## 2023-07-27 NOTE — Procedures (Signed)
Interventional Radiology Procedure Note  Procedure: Placement of percutaneous 64F balloon-retention gastrostomy tube.  Complications: None  EBL:  < 5 mL  Recommendations: - NPO for 4 hours after placement - May advance diet as tolerated and begin using tube 4 hours after placement - Gastropexy sutures are absorbable and do not require removal  Marliss Coots, MD Pager: [phone]

## 2023-07-28 ENCOUNTER — Inpatient Hospital Stay: Payer: Medicare HMO

## 2023-07-28 ENCOUNTER — Encounter: Payer: Self-pay | Admitting: Oncology

## 2023-07-28 ENCOUNTER — Inpatient Hospital Stay (HOSPITAL_BASED_OUTPATIENT_CLINIC_OR_DEPARTMENT_OTHER): Payer: Medicare HMO | Admitting: Oncology

## 2023-07-28 ENCOUNTER — Telehealth: Payer: Self-pay

## 2023-07-28 VITALS — BP 134/82 | HR 112 | Temp 98.2°F | Resp 16 | Ht 69.0 in | Wt 141.4 lb

## 2023-07-28 DIAGNOSIS — Z510 Encounter for antineoplastic radiation therapy: Secondary | ICD-10-CM | POA: Diagnosis not present

## 2023-07-28 DIAGNOSIS — C3492 Malignant neoplasm of unspecified part of left bronchus or lung: Secondary | ICD-10-CM

## 2023-07-28 DIAGNOSIS — C321 Malignant neoplasm of supraglottis: Secondary | ICD-10-CM | POA: Diagnosis not present

## 2023-07-28 DIAGNOSIS — G893 Neoplasm related pain (acute) (chronic): Secondary | ICD-10-CM

## 2023-07-28 DIAGNOSIS — Z5111 Encounter for antineoplastic chemotherapy: Secondary | ICD-10-CM | POA: Diagnosis not present

## 2023-07-28 DIAGNOSIS — F418 Other specified anxiety disorders: Secondary | ICD-10-CM

## 2023-07-28 DIAGNOSIS — C051 Malignant neoplasm of soft palate: Secondary | ICD-10-CM | POA: Diagnosis not present

## 2023-07-28 DIAGNOSIS — C328 Malignant neoplasm of overlapping sites of larynx: Secondary | ICD-10-CM | POA: Diagnosis not present

## 2023-07-28 DIAGNOSIS — R232 Flushing: Secondary | ICD-10-CM | POA: Insufficient documentation

## 2023-07-28 DIAGNOSIS — F17218 Nicotine dependence, cigarettes, with other nicotine-induced disorders: Secondary | ICD-10-CM

## 2023-07-28 LAB — CBC WITH DIFFERENTIAL (CANCER CENTER ONLY)
Abs Immature Granulocytes: 0.03 10*3/uL (ref 0.00–0.07)
Basophils Absolute: 0 10*3/uL (ref 0.0–0.1)
Basophils Relative: 0 %
Eosinophils Absolute: 0.1 10*3/uL (ref 0.0–0.5)
Eosinophils Relative: 1 %
HCT: 40 % (ref 39.0–52.0)
Hemoglobin: 13.8 g/dL (ref 13.0–17.0)
Immature Granulocytes: 0 %
Lymphocytes Relative: 33 %
Lymphs Abs: 3.5 10*3/uL (ref 0.7–4.0)
MCH: 32.7 pg (ref 26.0–34.0)
MCHC: 34.5 g/dL (ref 30.0–36.0)
MCV: 94.8 fL (ref 80.0–100.0)
Monocytes Absolute: 0.8 10*3/uL (ref 0.1–1.0)
Monocytes Relative: 7 %
Neutro Abs: 6 10*3/uL (ref 1.7–7.7)
Neutrophils Relative %: 59 %
Platelet Count: 213 10*3/uL (ref 150–400)
RBC: 4.22 MIL/uL (ref 4.22–5.81)
RDW: 13.4 % (ref 11.5–15.5)
WBC Count: 10.4 10*3/uL (ref 4.0–10.5)
nRBC: 0 % (ref 0.0–0.2)

## 2023-07-28 LAB — BASIC METABOLIC PANEL - CANCER CENTER ONLY
Anion gap: 10 (ref 5–15)
BUN: 5 mg/dL — ABNORMAL LOW (ref 8–23)
CO2: 27 mmol/L (ref 22–32)
Calcium: 9.5 mg/dL (ref 8.9–10.3)
Chloride: 103 mmol/L (ref 98–111)
Creatinine: 0.48 mg/dL — ABNORMAL LOW (ref 0.61–1.24)
GFR, Estimated: >60 mL/min (ref >60)
Glucose, Bld: 115 mg/dL — ABNORMAL HIGH (ref 70–99)
Potassium: 3.5 mmol/L (ref 3.5–5.1)
Sodium: 140 mmol/L (ref 135–145)

## 2023-07-28 LAB — MAGNESIUM: Magnesium: 1.8 mg/dL (ref 1.7–2.4)

## 2023-07-28 MED ORDER — ALPRAZOLAM 0.5 MG PO TABS: 0.5000 mg | ORAL_TABLET | Freq: Every evening | ORAL | 0 refills | Status: DC | PRN

## 2023-07-28 MED ORDER — HEPARIN SOD (PORK) LOCK FLUSH 100 UNIT/ML IV SOLN
500.0000 [IU] | Freq: Once | INTRAVENOUS | Status: AC
  Administered 2023-07-28: 500 [IU]

## 2023-07-28 MED ORDER — SODIUM CHLORIDE 0.9% FLUSH
10.0000 mL | Freq: Once | INTRAVENOUS | Status: AC
  Administered 2023-07-28: 10 mL

## 2023-07-28 MED FILL — Fosaprepitant Dimeglumine For IV Infusion 150 MG (Base Eq): INTRAVENOUS | Qty: 5 | Status: AC

## 2023-07-28 NOTE — Telephone Encounter (Signed)
Returned patient's wife's Britta Mccreedy) call and informed her that patient would not be radioactive from the form of radiation he will be receiving (external beam). Confirmed that she was aware of safety precautions pertaining to patient's chemotherapy treatment. Britta Mccreedy verbalized understanding and appreciation of call. Denied any other needs at this time

## 2023-07-28 NOTE — Progress Notes (Signed)
[facility]  ONCOLOGY CLINIC PROGRESS NOTE   Patient Care Team: [name], Wayland Salinas, MD as PCP - General (Internal Medicine) Wendall Stade, MD as PCP - Cardiology (Cardiology) Therese Sarah, RN as Oncology Nurse Navigator (Oncology) Doreatha Massed, MD as Medical Oncologist (Medical Oncology) Malmfelt, Lise Auer, RN as Oncology Nurse Navigator Lonie Peak, MD as Consulting Physician (Radiation Oncology) [name], MD as Consulting Physician (Oncology) Laren Boom, DO as Consulting Physician (Otolaryngology) Lonie Peak, MD as Attending Physician (Radiation Oncology)  PATIENT NAME: Bryan Fleming   MR#: [id number] DOB: June 21, 1961  Date of visit: 07/28/2023   ASSESSMENT & PLAN:   Bryan Fleming is a 62 y.o.  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   Cancer of soft palate (HCC) Recent diagnosis of squamous cell carcinoma in the supraglottis, oropharynx, multifocal disease.  Noted changes in voice and difficulty swallowing solid foods. No signs of lung cancer recurrence on PET scan.   He seems to have diffuse disease throughout much of his oropharynx.  This is not appearing obviously on his PET scan.  He also has right supraglottic cancer and a hypermetabolic right level 3 lymph node consistent with lymphatic spread from his head and neck primaries.  He is less likely to be a surgical candidate given the extent of his disease.  Hence recommendation made to proceed with concurrent chemoradiation and patient and his daughter are agreeable with  this plan.  -We have discussed about role of cisplatin being a radiosensitizer in the treatment of head and neck cancer.  We have discussed about the curative intent of chemoradiation for this patient.     We have discussed about mechanism of action of cisplatin, adverse effects of cisplatin including but not limited to fatigue, nausea, vomiting, increased risk of infections, mucositis, ototoxicity, nephrotoxicity, peripheral neuropathy.  Patient understands that some of the side effects can be permanent and even potentially fatal.  We have discussed about role of Mediport and G-tube for chemotherapy administration and nutrition respectively since most of these patients have severe mucositis during the treatment.  At this time we do not know if weekly cisplatin is inferior to every 21 days cisplatin since there is no head-to-head comparison trial.  I did mention to the patient however weekly cisplatin is well-tolerated with less adverse effects and most of the patients tend to complete treatment as planned.  Patient is willing to proceed with weekly cisplatin.  -Yesterday he underwent feeding tube placement which will help to maintain hydration and nutrition during treatment due to expected mouth soreness and difficulty swallowing.  -He is tentatively scheduled to begin radiation treatments from 07/31/2023.  We will start him on chemotherapy with cisplatin same day.  Labs today reveal no dose-limiting toxicities.  -I will see him on 08/03/2023 for follow-up.  Cisplatin will be continued weekly.  Cancer associated pain - Refilled oxycodone and educated on judicial use.  Primary lung squamous cell carcinoma, left (HCC) - Please see HPI above for details and past treatments at [facility], under the direction of Dr. Ellin Saba.  -Remains in clinical remission.  Recent PET/CT showed no evidence of disease recurrence as far  as lung cancer is concerned.  Will continue surveillance for  now.  Nicotine dependence, cigarettes, with other nicotine-induced disorders Reports smoking a pack a day. Noted wheezing on both sides. -Prescribed nicotine patch and encourage smoking cessation for overall health and cancer prevention.  Situational anxiety -Patient reported insomnia from recent developments and has been experiencing fatigue from it.    -Per patient request, I gave him a trial of Xanax to be used at bedtime as needed and explained to him that we will not continue this after he completes concurrent chemoradiation.    I reviewed lab results and outside records for this visit and discussed relevant results with the patient. Diagnosis, plan of care and treatment options were also discussed in detail with the patient. Opportunity provided to ask questions and answers provided to his apparent satisfaction. Provided instructions to call our clinic with any problems, questions or concerns prior to return visit. I recommended to continue follow-up with PCP and sub-specialists. He verbalized understanding and agreed with the plan.   NCCN guidelines have been consulted in the planning of this patient's care.  I spent a total of 40 minutes during this encounter with the patient including review of chart and various tests results, discussions about plan of care and coordination of care plan.   [name], MD  07/28/2023 4:41 PM  [facility] - A DEPT OF [facility] [address] [place] [address] Dept: [phone] Dept Fax: [phone]    CHIEF COMPLAINT/ REASON FOR VISIT:   Moderate to poorly differentiated squamous cell carcinoma of the right supraglottis and oropharynx, with nodal involvement, multifocal disease   Current Treatment: Concurrent chemoradiation with weekly cisplatin to start from 07/31/2023.  INTERVAL HISTORY:    Discussed the use of AI scribe software for clinical note transcription  with the patient, who gave verbal consent to proceed.   Bryan Fleming is here today for repeat clinical assessment.    He is scheduled to start chemotherapy and radiation treatments next week. He has been educated about the treatment plan and has no questions at this time. He has been experiencing pain, which is currently controlled with oxycodone. He is also trying to cut down on smoking and was supposed to receive patches, but there was a mix-up at the pharmacy. The patient is also having difficulty sleeping at night.  I have reviewed the past medical history, past surgical history, social history and family history with the patient and they are unchanged from previous note.  HISTORY OF PRESENT ILLNESS:   Oncology History  Primary lung squamous cell carcinoma, left (HCC)  12/31/2020 Cancer Staging   Staging form: Lung, AJCC 8th Edition - Clinical stage from 12/31/2020: Stage IIIA (cT2b, cN2, cM0) - Signed by Doreatha Massed, MD on 01/01/2021 Stage prefix: Initial diagnosis   01/01/2021 Initial Diagnosis   Primary lung squamous cell carcinoma, left (HCC)   01/13/2021 - 02/24/2021 Chemotherapy   Patient is on Treatment Plan : LUNG NSCLC Carboplatin / Paclitaxel q21d x 3 cycles     Cancer of soft palate (HCC)  07/19/2023 Initial Diagnosis   Cancer of soft palate (HCC)   07/19/2023 Cancer Staging   Staging form: Pharynx - P16 Negative Oropharynx, AJCC 8th Edition - Clinical stage from 07/19/2023: Stage III (cT3, cN1, cM0, p16-) - Signed by Lonie Peak, MD on 07/19/2023 Stage prefix: Initial diagnosis   07/31/2023 -  Chemotherapy   Patient is on Treatment  Plan : HEAD/NECK Cisplatin (40) q7d     Malignant neoplasm of supraglottis (HCC)  07/19/2023 Initial Diagnosis   Malignant neoplasm of supraglottis (HCC)   07/31/2023 -  Chemotherapy   Patient is on Treatment Plan : HEAD/NECK Cisplatin (40) q7d         REVIEW OF SYSTEMS:   Review of Systems - Oncology  All other  pertinent systems were reviewed with the patient and are negative.  ALLERGIES: He has no known allergies.  MEDICATIONS:  Current Outpatient Medications  Medication Sig Dispense Refill   acetaminophen (TYLENOL) 500 MG tablet Take 2 tablets (1,000 mg total) by mouth every 6 (six) hours. 45 tablet 0   albuterol (VENTOLIN HFA) 108 (90 Base) MCG/ACT inhaler Inhale 1-2 puffs into the lungs every 6 (six) hours as needed for wheezing or shortness of breath.     ALPRAZolam (XANAX) 0.5 MG tablet Take 1 tablet (0.5 mg total) by mouth at bedtime as needed for anxiety. 30 tablet 0   amLODipine (NORVASC) 5 MG tablet Take 5 mg by mouth in the morning.     celecoxib (CELEBREX) 200 MG capsule Take 1 capsule (200 mg total) by mouth 2 (two) times daily. 28 capsule 1   dexamethasone (DECADRON) 4 MG tablet Take 2 tablets (8 mg) by mouth daily x 3 days starting the day after cisplatin chemotherapy. Take with food. 30 tablet 1   Fluticasone-Umeclidin-Vilant (TRELEGY ELLIPTA) 100-62.5-25 MCG/INH AEPB Inhale 1 puff into the lungs in the morning.     gabapentin (NEURONTIN) 300 MG capsule Take 300 mg by mouth 3 (three) times daily.     lidocaine-prilocaine (EMLA) cream Apply to affected area once 30 g 3   Multiple Vitamin (MULTIVITAMIN WITH MINERALS) TABS tablet Take 1 tablet by mouth in the morning.     nicotine (NICODERM CQ) 14 mg/24hr patch Place 1 patch (14 mg total) onto the skin daily. 28 patch 1   omeprazole (PRILOSEC) 40 MG capsule Take 1 capsule (40 mg total) by mouth 2 (two) times daily. 60 capsule 12   ondansetron (ZOFRAN) 8 MG tablet Take 1 tablet (8 mg total) by mouth every 8 (eight) hours as needed for nausea or vomiting. Start on the third day after cisplatin. 30 tablet 1   oxyCODONE 10 MG TABS Take 1 tablet (10 mg total) by mouth every 4 (four) hours as needed for severe pain (pain score 7-10). 90 tablet 0   prochlorperazine (COMPAZINE) 10 MG tablet Take 1 tablet (10 mg total) by mouth every 6 (six) hours  as needed (Nausea or vomiting). 30 tablet 1   No current facility-administered medications for this visit.     VITALS:   Blood pressure 134/82, pulse (!) 112, temperature 98.2 F (36.8 C), temperature source Temporal, resp. rate 16, height 5\' 9"  (1.753 m), weight 141 lb 6.4 oz (64.1 kg), SpO2 95%.  Wt Readings from Last 3 Encounters:  07/28/23 141 lb 6.4 oz (64.1 kg)  07/27/23 141 lb (64 kg)  07/21/23 142 lb (64.4 kg)    Body mass index is 20.88 kg/m.  Performance status (ECOG): 1 - Symptomatic but completely ambulatory  PHYSICAL EXAM:   Physical Exam Constitutional:      General: He is not in acute distress.    Appearance: Normal appearance.  HENT:     Head: Normocephalic and atraumatic.     Mouth/Throat:     Comments: Erythematous, subtly friable mucosa more notable in the left soft palate than the right soft palate.  Eyes:     General: No scleral icterus.    Conjunctiva/sclera: Conjunctivae normal.  Cardiovascular:     Rate and Rhythm: Normal rate and regular rhythm.     Heart sounds: Normal heart sounds.  Pulmonary:     Effort: Pulmonary effort is normal.     Breath sounds: Wheezing (scattered, bilateral) present.  Chest:     Comments: Right-sided Port-A-Cath in place, without any signs of infection Abdominal:     General: There is no distension.     Comments: Feeding tube in place  Musculoskeletal:     Right lower leg: No edema.     Left lower leg: No edema.  Neurological:     General: No focal deficit present.     Mental Status: He is alert and oriented to person, place, and time.  Psychiatric:        Mood and Affect: Mood normal.        Behavior: Behavior normal.        Thought Content: Thought content normal.       LABORATORY DATA:   I have reviewed the data as listed.  Results for orders placed or performed in visit on 07/28/23  Magnesium  Result Value Ref Range   Magnesium 1.8 1.7 - 2.4 mg/dL  Basic Metabolic Panel - Cancer Center Only   Result Value Ref Range   Sodium 140 135 - 145 mmol/L   Potassium 3.5 3.5 - 5.1 mmol/L   Chloride 103 98 - 111 mmol/L   CO2 27 22 - 32 mmol/L   Glucose, Bld 115 (H) 70 - 99 mg/dL   BUN 5 (L) 8 - 23 mg/dL   Creatinine 1.61 (L) 0.96 - 1.24 mg/dL   Calcium 9.5 8.9 - 04.5 mg/dL   GFR, Estimated >40 >98 mL/min   Anion gap 10 5 - 15  CBC with Differential (Cancer Center Only)  Result Value Ref Range   WBC Count 10.4 4.0 - 10.5 K/uL   RBC 4.22 4.22 - 5.81 MIL/uL   Hemoglobin 13.8 13.0 - 17.0 g/dL   HCT 11.9 14.7 - 82.9 %   MCV 94.8 80.0 - 100.0 fL   MCH 32.7 26.0 - 34.0 pg   MCHC 34.5 30.0 - 36.0 g/dL   RDW 56.2 13.0 - 86.5 %   Platelet Count 213 150 - 400 K/uL   nRBC 0.0 0.0 - 0.2 %   Neutrophils Relative % 59 %   Neutro Abs 6.0 1.7 - 7.7 K/uL   Lymphocytes Relative 33 %   Lymphs Abs 3.5 0.7 - 4.0 K/uL   Monocytes Relative 7 %   Monocytes Absolute 0.8 0.1 - 1.0 K/uL   Eosinophils Relative 1 %   Eosinophils Absolute 0.1 0.0 - 0.5 K/uL   Basophils Relative 0 %   Basophils Absolute 0.0 0.0 - 0.1 K/uL   Immature Granulocytes 0 %   Abs Immature Granulocytes 0.03 0.00 - 0.07 K/uL         RADIOGRAPHIC STUDIES:  I have personally reviewed the radiological images as listed and agree with the findings in the report.  IR Gastrostomy Tube Result Date: 07/27/2023 INDICATION: 62 year old male with history of head neck cancer requiring percutaneous gastrostomy access for nutritional purposes. EXAM: 1. Fluoroscopic guided orogastric tube placement. 2. PERC PLACEMENT GASTROSTOMY MEDICATIONS: Ancef 1 gm IV; Antibiotics were administered within 1 hour of the procedure. ANESTHESIA/SEDATION: Moderate (conscious) sedation was employed during this procedure. A total of Versed 1 mg and Fentanyl 50 mcg was administered  intravenously. Moderate Sedation Time: 10 minutes. The patient's level of consciousness and vital signs were monitored continuously by radiology nursing throughout the procedure under  my direct supervision. CONTRAST:  Seven-administered into the gastric lumen. FLUOROSCOPY TIME:  Eleven mGy COMPLICATIONS: None immediate. PROCEDURE: Informed written consent was obtained from the patient after a thorough discussion of the procedural risks, benefits and alternatives. All questions were addressed. Maximal Sterile barrier Technique was utilized including caps, mask, sterile gowns, sterile gloves, sterile drape, hand hygiene and skin antiseptic. A timeout was performed prior to the initiation of the procedure. The patient was placed on the procedure table in the supine position. Pre-procedure abdominal film confirmed visualization of the transverse colon. An angled 5-French catheter was passed through the nares into the stomach. The patient was prepped and draped in usual sterile fashion. The stomach was insufflated with air via the indwelling nasogastric tube. Under fluoroscopy, a puncture site was selected and local analgesia achieved with 1% lidocaine infiltrated subcutaneously. Under fluoroscopic guidance, a gastropexy needle was passed into the stomach and the T-bar suture was released. Entry into the stomach was confirmed with fluoroscopy, aspiration of air, and injection of contrast material. This was repeated with an additional gastropexy suture (for a total of 2 fasteners). At the center of these gastropexy sutures, a dermatotomy was performed. An 18 gauge needle was passed into the stomach at the site of this dermatotomy, and position within the gastric lumen again confirmed under fluoroscopy using aspiration of air and contrast injection. An Amplatz guidewire was passed through this needle and intraluminal placement within the stomach was confirmed by fluoroscopy. The needle was removed. Over the guidewire, the percutaneous tract was dilated using a 10 mm non-compliant balloon. The balloon was deflated, then pushed into the gastric lumen followed in concert by the 20 Fr gastrostomy tube. The  retention balloon of the percutaneous gastrostomy tube was inflated with 10 mL of sterile water. The tube was withdrawn until the retention balloon was at the edge of the gastric lumen. The external bumper was brought to the abdominal wall. Contrast was injected through the gastrostomy tube, confirming intraluminal positioning. The patient tolerated the procedure well without any immediate post-procedural complications. IMPRESSION: Technically successful placement of 20 Fr gastrostomy tube. PLAN: Return in 6 months for routine gastrostomy tube exchange. Marliss Coots, MD [facility] Radiology Electronically Signed   By: Marliss Coots M.D.   On: 07/27/2023 21:08   NM PET Image Initial (PI) Skull Base To Thigh (F-18 FDG) Result Date: 07/13/2023 CLINICAL DATA:  Initial treatment strategy for squamous cell carcinoma of the larynx. History of non-small cell lung cancer status post left upper lobe lobectomy. EXAM: NUCLEAR MEDICINE PET SKULL BASE TO THIGH TECHNIQUE: 6.17 mCi F-18 FDG was injected intravenously. Full-ring PET imaging was performed from the skull base to thigh after the radiotracer. CT data was obtained and used for attenuation correction and anatomic localization. Fasting blood glucose: 107 mg/dl COMPARISON:  Prior PET-CT 12/14/2020 and neck CT 06/19/2023 FINDINGS: Mediastinal blood pool activity: SUV max 1.41 Liver activity: SUV max NA NECK: The right-sided supraglottic mass is hypermetabolic with SUV max of 8.04. The adjacent enlarged right level III lymph node is hypermetabolic with SUV max of 8.93. No other enlarged or hypermetabolic neck nodes are identified. Incidental CT findings: Age advanced bilateral carotid artery calcifications. CHEST: No hypermetabolic mediastinal or hilar nodes. No suspicious pulmonary nodules on the CT scan. No enlarged or hypermetabolic supraclavicular or axillary lymph nodes. Incidental CT  findings: Stable surgical changes  from a left upper lobe lobectomy. No findings suspicious for residual or recurrent lung cancer. Stable age advanced emphysematous changes and pulmonary scarring. Stable significant age advanced aortic and three-vessel coronary artery calcifications. The right IJ Port-A-Cath is in good position. ABDOMEN/PELVIS: No abnormal hypermetabolic activity within the liver, pancreas, adrenal glands, or spleen. No hypermetabolic lymph nodes in the abdomen or pelvis. Incidental CT findings: Age advanced vascular disease but no aneurysm. A penile prosthesis is in place. 10 mm gallstone noted. SKELETON: No findings for osseous metastatic disease. Focus of hypermetabolism noted on the patient's skin and left groin area. This is probably urine contamination. No abnormality on the CT scan is identified. Incidental CT findings: None. IMPRESSION: 1. Hypermetabolic right-sided supraglottic mass consistent with the patient's known primary malignancy. 2. Hypermetabolic adjacent right level III lymph node consistent with metastatic disease. 3. No findings for distant metastatic disease involving the chest, abdomen/pelvis or bony structures. 4. Stable surgical changes from a left upper lobe lobectomy. No findings for residual or recurrent lung cancer. 5. Age advanced vascular disease. 6. Cholelithiasis. 7. Aortic atherosclerosis and emphysema. Aortic Atherosclerosis (ICD10-I70.0). Electronically Signed   By: Rudie Meyer M.D.   On: 07/13/2023 16:21    CODE STATUS:  Code Status History     Date Active Date Inactive Code Status Order ID Comments User Context   07/27/2023 1558 07/28/2023 0516 Full Code [id number]  Bennie Dallas, MD [facility]   12/01/2022 2127 12/06/2022 1709 Full Code [id number]  Elgergawy, Leana Roe, MD ED   10/13/2022 1155 10/14/2022 1706 Full Code [id number]  [name], MD Inpatient   02/17/2022 1635 02/18/2022 1856 Full Code [id number]  [name], MD Inpatient   04/21/2021 2124 04/23/2021 2111 Full Code  [id number]  Barrett, Allayne Butcher Inpatient   06/04/2020 1253 06/06/2020 1807 Full Code [id number]  Cleora Fleet, MD ED    Questions for Most Recent Historical Code Status (Order [id number])     Question Answer   By: Consent: discussion documented in EHR            No orders of the defined types were placed in this encounter.    Future Appointments  Date Time Provider Department Center  07/31/2023  7:30 AM [facility] INFUSION [facility] None  07/31/2023  1:45 PM Alphonse Guild, RD [facility] None  07/31/2023  3:15 PM Lonie Peak, MD [facility] None  07/31/2023  3:30 PM LINAC-SQUIRE [facility] None  08/01/2023  7:00 AM [facility] LINAC 3 [facility] None  08/03/2023 11:15 AM [facility] FLUSH [facility] None  08/03/2023 11:40 AM Aryani Daffern, MD [facility] None  08/03/2023  3:15 PM [facility] LINAC 4 [facility] None  08/04/2023  3:15 PM [facility] LINAC 3 [facility] None  08/07/2023  7:30 AM [facility] INFUSION [facility] None  08/07/2023  3:15 PM [facility] [id number] [facility] None  08/08/2023  7:00 AM [facility] LINAC 4 [facility] None  08/10/2023  3:15 PM [facility] [id number] [facility] None  08/11/2023 11:00 AM [facility] FLUSH [facility] None  08/11/2023 11:20 AM Bentleigh Stankus, MD [facility] None  08/11/2023  3:15 PM [facility] [id number] [facility] None  08/14/2023  7:45 AM [facility] INFUSION [facility] None  08/14/2023  3:15 PM [facility] [id number] [facility] None  08/15/2023  3:15 PM [facility] [id number] [facility] None  08/16/2023  3:15 PM [facility] [id number] [facility] None  08/17/2023 11:15 AM [facility] FLUSH [facility] None  08/17/2023 11:40 AM Joannie Medine, MD [facility] None  08/17/2023  3:15 PM [facility] [id number] [facility] None  08/18/2023  3:15  PM [facility] [id number] [facility] None  08/21/2023  8:00 AM [facility] INFUSION [facility] None  08/21/2023  3:15 PM [facility] [id number] [facility] None  08/22/2023  3:15 PM  [facility] [id number] [facility] None  08/23/2023  3:15 PM [facility] [id number] [facility] None  08/24/2023  3:15 PM [facility] [id number] [facility] None  08/25/2023 10:45 AM [facility] FLUSH [facility] None  08/25/2023 11:20 AM Merric Yost, MD [facility] None  08/25/2023  3:15 PM [facility] [id number] [facility] None  08/28/2023  7:30 AM [facility] INFUSION [facility] None  08/28/2023  3:15 PM [facility] [id number] [facility] None  08/29/2023  3:15 PM [facility] [id number] [facility] None  08/30/2023  3:15 PM [facility] [id number] [facility] None  08/31/2023  3:15 PM [facility] [id number] [facility] None  09/01/2023 11:00 AM [facility] FLUSH [facility] None  09/01/2023 11:20 AM Shanyah Gattuso, MD [facility] None  09/01/2023  3:15 PM [facility] [id number] [facility] None  09/04/2023  7:30 AM [facility] INFUSION [facility] None  09/04/2023  3:15 PM [facility] [id number] [facility] None  09/05/2023  3:15 PM [facility] [id number] [facility] None  09/06/2023  3:15 PM [facility] [id number] [facility] None  09/07/2023  3:15 PM [facility] [id number] [facility] None  09/08/2023 11:00 AM [facility] FLUSH [facility] None  09/08/2023 11:20 AM Haliey Romberg, MD [facility] None  09/08/2023  3:15 PM [facility] [id number] [facility] None  09/11/2023  7:30 AM [facility] INFUSION [facility] None  09/11/2023  3:15 PM [facility] [id number] [facility] None  09/12/2023  3:15 PM [facility] [id number] [facility] None  09/13/2023  3:15 PM [facility] [id number] [facility] None  09/14/2023  1:00 PM AP-ACAPA NURSE [facility] None  09/14/2023  2:00 PM AP-CT 1 AP-CT [place] [facility]  09/14/2023  3:15 PM [facility] [id number] [facility] None  09/15/2023  3:15 PM [facility] [id number] [facility] None  09/18/2023  3:15 PM [facility] [id number] [facility] None  09/19/2023  3:15 PM [facility] [id number] [facility] None  09/21/2023  3:00 PM Doreatha Massed, MD [facility] None      This document was completed  utilizing speech recognition software. Grammatical errors, random word insertions, pronoun errors, and incomplete sentences are an occasional consequence of this system due to software limitations, ambient noise, and hardware issues. Any formal questions or concerns about the content, text or information contained within the body of this dictation should be directly addressed to the provider for clarification.

## 2023-07-28 NOTE — Assessment & Plan Note (Addendum)
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  Noted changes in voice and difficulty swallowing solid foods. No signs of lung cancer recurrence on PET scan.   He seems to have diffuse disease throughout much of his oropharynx.  This is not appearing obviously on his PET scan.  He also has right supraglottic cancer and a hypermetabolic right level 3 lymph node consistent with lymphatic spread from his head and neck primaries.  He is less likely to be a surgical candidate given the extent of his disease.  Hence recommendation made to proceed with concurrent chemoradiation and patient and his daughter are agreeable with this plan.  -We have discussed about role of cisplatin being a radiosensitizer in the treatment of head and neck cancer.  We have discussed about the curative intent of chemoradiation for this patient.     We have discussed about mechanism of action of cisplatin, adverse effects of cisplatin including but not limited to fatigue, nausea, vomiting, increased risk of infections, mucositis, ototoxicity, nephrotoxicity, peripheral neuropathy.  Patient understands that some of the side effects can be permanent and even potentially fatal.  We have discussed about role of Mediport and G-tube for chemotherapy administration and nutrition respectively since most of these patients have severe mucositis during the treatment.  At this time we do not know if weekly cisplatin is inferior to every 21 days cisplatin since there is no head-to-head comparison trial.  I did mention to the patient however weekly cisplatin is well-tolerated with less adverse effects and most of the patients tend to complete treatment as planned.  Patient is willing to proceed with weekly cisplatin.  -Yesterday he underwent feeding tube placement which will help to maintain hydration and nutrition during treatment due to expected mouth soreness and difficulty swallowing.  -He is  tentatively scheduled to begin radiation treatments from 07/31/2023.  We will start him on chemotherapy with cisplatin same day.  Labs today reveal no dose-limiting toxicities.  -I will see him on 08/03/2023 for follow-up.  Cisplatin will be continued weekly.

## 2023-07-28 NOTE — Assessment & Plan Note (Signed)
 Reports smoking a pack a day. Noted wheezing on both sides. -Prescribed nicotine patch and encourage smoking cessation for overall health and cancer prevention.

## 2023-07-28 NOTE — Assessment & Plan Note (Signed)
-   Please see HPI above for details and past treatments at [facility], under the direction of Dr. Ellin Saba.  -Remains in clinical remission.  Recent PET/CT showed no evidence of disease recurrence as far as lung cancer is concerned.  Will continue surveillance for now.

## 2023-07-28 NOTE — Assessment & Plan Note (Signed)
-  Patient reported insomnia from recent developments and has been experiencing fatigue from it.    -Per patient request, I gave him a trial of Xanax to be used at bedtime as needed and explained to him that we will not continue this after he completes concurrent chemoradiation.

## 2023-07-28 NOTE — Assessment & Plan Note (Signed)
-   Refilled oxycodone and educated on judicial use.

## 2023-07-31 ENCOUNTER — Ambulatory Visit
Admission: RE | Admit: 2023-07-31 | Discharge: 2023-07-31 | Disposition: A | Discharge status: Home / Self Care | Payer: Medicare HMO | Source: Ambulatory Visit | Attending: Radiation Oncology | Admitting: Radiation Oncology

## 2023-07-31 ENCOUNTER — Other Ambulatory Visit: Payer: Self-pay | Admitting: Radiation Oncology

## 2023-07-31 ENCOUNTER — Inpatient Hospital Stay: Payer: Medicare HMO

## 2023-07-31 ENCOUNTER — Other Ambulatory Visit: Payer: Self-pay

## 2023-07-31 VITALS — BP 131/71 | HR 82 | Temp 97.7°F | Resp 16 | Wt 144.5 lb

## 2023-07-31 DIAGNOSIS — C321 Malignant neoplasm of supraglottis: Secondary | ICD-10-CM

## 2023-07-31 DIAGNOSIS — C051 Malignant neoplasm of soft palate: Secondary | ICD-10-CM

## 2023-07-31 DIAGNOSIS — Z5111 Encounter for antineoplastic chemotherapy: Secondary | ICD-10-CM | POA: Diagnosis not present

## 2023-07-31 DIAGNOSIS — C328 Malignant neoplasm of overlapping sites of larynx: Secondary | ICD-10-CM | POA: Diagnosis not present

## 2023-07-31 DIAGNOSIS — R232 Flushing: Secondary | ICD-10-CM

## 2023-07-31 DIAGNOSIS — Z510 Encounter for antineoplastic radiation therapy: Secondary | ICD-10-CM | POA: Diagnosis not present

## 2023-07-31 LAB — RAD ONC ARIA SESSION SUMMARY
Course Elapsed Days: 0
Plan Fractions Treated to Date: 1
Plan Prescribed Dose Per Fraction: 2 Gy
Plan Total Fractions Prescribed: 35
Plan Total Prescribed Dose: 70 Gy
Reference Point Dosage Given to Date: 2 Gy
Reference Point Session Dosage Given: 2 Gy
Session Number: 1

## 2023-07-31 MED ORDER — SODIUM CHLORIDE 0.9% FLUSH
10.0000 mL | INTRAVENOUS | Status: DC | PRN
  Administered 2023-07-31: 10 mL

## 2023-07-31 MED ORDER — HEPARIN SOD (PORK) LOCK FLUSH 100 UNIT/ML IV SOLN
500.0000 [IU] | Freq: Once | INTRAVENOUS | Status: AC | PRN
  Administered 2023-07-31: 500 [IU]

## 2023-07-31 MED ORDER — SONAFINE EX EMUL
1.0000 | Freq: Two times a day (BID) | CUTANEOUS | Status: DC
  Administered 2023-07-31: 1 via TOPICAL

## 2023-07-31 MED ORDER — LIDOCAINE VISCOUS HCL 2 % MT SOLN: OROMUCOSAL | 3 refills | Status: DC

## 2023-07-31 MED ORDER — POTASSIUM CHLORIDE IN NACL 20-0.9 MEQ/L-% IV SOLN
Freq: Once | INTRAVENOUS | Status: AC
Start: 2023-07-31 — End: 2023-07-31
  Filled 2023-07-31: qty 1000

## 2023-07-31 MED ORDER — MAGNESIUM SULFATE 2 GM/50ML IV SOLN
2.0000 g | Freq: Once | INTRAVENOUS | Status: AC
  Administered 2023-07-31: 2 g via INTRAVENOUS
  Filled 2023-07-31: qty 50

## 2023-07-31 MED ORDER — CISPLATIN CHEMO INJECTION 100MG/100ML
40.0000 mg/m2 | Freq: Once | INTRAVENOUS | Status: AC
  Administered 2023-07-31: 70 mg via INTRAVENOUS
  Filled 2023-07-31: qty 70

## 2023-07-31 MED ORDER — DEXAMETHASONE SODIUM PHOSPHATE 10 MG/ML IJ SOLN
10.0000 mg | Freq: Once | INTRAMUSCULAR | Status: AC
  Administered 2023-07-31: 10 mg via INTRAVENOUS
  Filled 2023-07-31: qty 1

## 2023-07-31 MED ORDER — SODIUM CHLORIDE 0.9 % IV SOLN
150.0000 mg | Freq: Once | INTRAVENOUS | Status: AC
  Administered 2023-07-31: 150 mg via INTRAVENOUS
  Filled 2023-07-31: qty 150

## 2023-07-31 MED ORDER — SODIUM CHLORIDE 0.9 % IV SOLN: INTRAVENOUS | Status: DC

## 2023-07-31 MED ORDER — PALONOSETRON HCL INJECTION 0.25 MG/5ML
0.2500 mg | Freq: Once | INTRAVENOUS | Status: AC
Start: 2023-07-31 — End: 2023-07-31
  Administered 2023-07-31: 0.25 mg via INTRAVENOUS
  Filled 2023-07-31: qty 5

## 2023-07-31 NOTE — Progress Notes (Signed)
Nutrition Follow-up:  Patient with SCC of right supra glottis and oropharynx, with  nodal involvement, multifocal disease.  Patient starting cisplatin and radiation today. PEG (20 Fr) placed on 12/19.  Met with patient during infusion.  Reports that everything went well during PEG placement.  He has been flushing tube with syringe of water daily.  Continues to eat soft foods (soups, eggs, bologna sandwich, mashed potatoes).  Ensure/boost shakes cost prohibitive for patient to drink.    Medications: reviewed  Labs: reviewed  Anthropometrics:   Weight 144 lb 8 oz today, increased  141 lb on 12/19  UBW of 152 lb    Estimated Energy Needs  Kcals: 2000-2300 Protein: 78-98 g Fluid: > 2 L  NUTRITION DIAGNOSIS: Sub optimal energy intake continues   INTERVENTION:  Reviewed soft, moist foods high in calories and protein.  Coupons given for ensure shakes along with 2 samples of ensure complete. Continue flushing tube with 1 syringe of water daily.  Patient demonstrated how to give water flush during RD's visit.   Contact information provided    MONITORING, EVALUATION, GOAL: weight trends, intake, tube feeding    NEXT VISIT: Monday, Dec 30 during infusion  Jermya Dowding B. Freida Busman, RD, LDN Registered Dietitian [phone]

## 2023-07-31 NOTE — Progress Notes (Signed)
Oncology Nurse Navigator Documentation   To provide support, encouragement and care continuity, met with Bryan Fleming for his initial RT.  He also received his first chemotherapy infusion today. I reviewed the 2-step treatment process, answered questions.  Mr. Lachance completed treatment without difficulty, denied questions/concerns. I reviewed the registration/arrival procedure for subsequent treatments. I encouraged them to call me with questions/concerns as treatments proceed.   Hedda Slade RN, BSN, OCN Head & Neck Oncology Nurse Navigator [facility] Phone # [phone]  Fax # [phone]

## 2023-07-31 NOTE — Patient Instructions (Signed)
 [facility] - A DEPT OF [facility]  Discharge Instructions: Thank you for choosing [facility] to provide your oncology and hematology care.   If you have a lab appointment with the Cancer Center, please go directly to the Cancer Center and check in at the registration area.   Wear comfortable clothing and clothing appropriate for easy access to any Portacath or PICC line.   We strive to give you quality time with your provider. You may need to reschedule your appointment if you arrive late (15 or more minutes).  Arriving late affects you and other patients whose appointments are after yours.  Also, if you miss three or more appointments without notifying the office, you may be dismissed from the clinic at the provider's discretion.      For prescription refill requests, have your pharmacy contact our office and allow 72 hours for refills to be completed.    Today you received the following chemotherapy and/or immunotherapy agents: Cisplatin.       To help prevent nausea and vomiting after your treatment, we encourage you to take your nausea medication as directed.  BELOW ARE SYMPTOMS THAT SHOULD BE REPORTED IMMEDIATELY: *FEVER GREATER THAN 100.4 F (38 C) OR HIGHER *CHILLS OR SWEATING *NAUSEA AND VOMITING THAT IS NOT CONTROLLED WITH YOUR NAUSEA MEDICATION *UNUSUAL SHORTNESS OF BREATH *UNUSUAL BRUISING OR BLEEDING *URINARY PROBLEMS (pain or burning when urinating, or frequent urination) *BOWEL PROBLEMS (unusual diarrhea, constipation, pain near the anus) TENDERNESS IN MOUTH AND THROAT WITH OR WITHOUT PRESENCE OF ULCERS (sore throat, sores in mouth, or a toothache) UNUSUAL RASH, SWELLING OR PAIN  UNUSUAL VAGINAL DISCHARGE OR ITCHING   Items with * indicate a potential emergency and should be followed up as soon as possible or go to the Emergency Department if any problems should occur.  Please show the CHEMOTHERAPY ALERT CARD or  IMMUNOTHERAPY ALERT CARD at check-in to the Emergency Department and triage nurse.  Should you have questions after your visit or need to cancel or reschedule your appointment, please contact [facility] - A DEPT OF [facility]  Dept: [phone]  and follow the prompts.  Office hours are 8:00 a.m. to 4:30 p.m. Monday - Friday. Please note that voicemails left after 4:00 p.m. may not be returned until the following business day.  We are closed weekends and major holidays. You have access to a nurse at all times for urgent questions. Please call the main number to the clinic Dept: [phone] and follow the prompts.   For any non-urgent questions, you may also contact your provider using MyChart. We now offer e-Visits for anyone 11 and older to request care online for non-urgent symptoms. For details visit [email or web address].   Also download the MyChart app! Go to the app store, search "MyChart", open the app, select [place], and log in with your MyChart username and password.

## 2023-08-01 ENCOUNTER — Telehealth: Payer: Self-pay

## 2023-08-01 ENCOUNTER — Other Ambulatory Visit: Payer: Self-pay

## 2023-08-01 ENCOUNTER — Ambulatory Visit
Admission: RE | Admit: 2023-08-01 | Discharge: 2023-08-01 | Disposition: A | Discharge status: Home / Self Care | Payer: Medicare HMO | Source: Ambulatory Visit | Attending: Radiation Oncology | Admitting: Radiation Oncology

## 2023-08-01 ENCOUNTER — Other Ambulatory Visit: Payer: Self-pay | Admitting: *Deleted

## 2023-08-01 DIAGNOSIS — C051 Malignant neoplasm of soft palate: Secondary | ICD-10-CM | POA: Diagnosis not present

## 2023-08-01 DIAGNOSIS — C321 Malignant neoplasm of supraglottis: Secondary | ICD-10-CM | POA: Diagnosis not present

## 2023-08-01 DIAGNOSIS — Z510 Encounter for antineoplastic radiation therapy: Secondary | ICD-10-CM | POA: Diagnosis not present

## 2023-08-01 DIAGNOSIS — C328 Malignant neoplasm of overlapping sites of larynx: Secondary | ICD-10-CM | POA: Diagnosis not present

## 2023-08-01 LAB — RAD ONC ARIA SESSION SUMMARY
Course Elapsed Days: 1
Plan Fractions Treated to Date: 2
Plan Prescribed Dose Per Fraction: 2 Gy
Plan Total Fractions Prescribed: 35
Plan Total Prescribed Dose: 70 Gy
Reference Point Dosage Given to Date: 4 Gy
Reference Point Session Dosage Given: 2 Gy
Session Number: 2

## 2023-08-01 NOTE — Telephone Encounter (Signed)
-----   Message from Nurse Reece Levy sent at 07/31/2023  1:44 PM EST ----- Regarding: Dr. Arlana Pouch 1st time Cisplatin f/u tol well Dr. Arlana Pouch 1st time Cisplatin, Pt call back due. Pt tolerated tx well without incident.

## 2023-08-01 NOTE — Telephone Encounter (Signed)
Mr. Watz Spouse  states that Braijon is doing fine. He is eating, drinking, and urinating well. They know to call the office at [phone] if  they have any questions or concerns.

## 2023-08-03 ENCOUNTER — Inpatient Hospital Stay: Payer: Medicare HMO | Admitting: Oncology

## 2023-08-03 ENCOUNTER — Other Ambulatory Visit: Payer: Self-pay

## 2023-08-03 ENCOUNTER — Inpatient Hospital Stay: Payer: Medicare HMO

## 2023-08-03 ENCOUNTER — Encounter: Payer: Self-pay | Admitting: Oncology

## 2023-08-03 ENCOUNTER — Ambulatory Visit
Admission: RE | Admit: 2023-08-03 | Discharge: 2023-08-03 | Disposition: A | Discharge status: Home / Self Care | Payer: Medicare HMO | Source: Ambulatory Visit | Attending: Radiation Oncology | Admitting: Radiation Oncology

## 2023-08-03 VITALS — BP 136/64 | HR 88 | Temp 98.2°F | Resp 17 | Wt 146.4 lb

## 2023-08-03 DIAGNOSIS — R232 Flushing: Secondary | ICD-10-CM

## 2023-08-03 DIAGNOSIS — C3492 Malignant neoplasm of unspecified part of left bronchus or lung: Secondary | ICD-10-CM

## 2023-08-03 DIAGNOSIS — C051 Malignant neoplasm of soft palate: Secondary | ICD-10-CM | POA: Diagnosis not present

## 2023-08-03 DIAGNOSIS — C321 Malignant neoplasm of supraglottis: Secondary | ICD-10-CM | POA: Diagnosis not present

## 2023-08-03 DIAGNOSIS — F418 Other specified anxiety disorders: Secondary | ICD-10-CM | POA: Diagnosis not present

## 2023-08-03 DIAGNOSIS — F17218 Nicotine dependence, cigarettes, with other nicotine-induced disorders: Secondary | ICD-10-CM

## 2023-08-03 DIAGNOSIS — C328 Malignant neoplasm of overlapping sites of larynx: Secondary | ICD-10-CM | POA: Diagnosis not present

## 2023-08-03 DIAGNOSIS — E876 Hypokalemia: Secondary | ICD-10-CM

## 2023-08-03 DIAGNOSIS — Z5111 Encounter for antineoplastic chemotherapy: Secondary | ICD-10-CM | POA: Diagnosis not present

## 2023-08-03 DIAGNOSIS — G893 Neoplasm related pain (acute) (chronic): Secondary | ICD-10-CM | POA: Diagnosis not present

## 2023-08-03 DIAGNOSIS — Z510 Encounter for antineoplastic radiation therapy: Secondary | ICD-10-CM | POA: Diagnosis not present

## 2023-08-03 LAB — MAGNESIUM: Magnesium: 1.8 mg/dL (ref 1.7–2.4)

## 2023-08-03 LAB — CBC WITH DIFFERENTIAL (CANCER CENTER ONLY)
Abs Immature Granulocytes: 0.05 10*3/uL (ref 0.00–0.07)
Basophils Absolute: 0 10*3/uL (ref 0.0–0.1)
Basophils Relative: 0 %
Eosinophils Absolute: 0 10*3/uL (ref 0.0–0.5)
Eosinophils Relative: 0 %
HCT: 40.4 % (ref 39.0–52.0)
Hemoglobin: 13.7 g/dL (ref 13.0–17.0)
Immature Granulocytes: 0 %
Lymphocytes Relative: 17 %
Lymphs Abs: 2.3 10*3/uL (ref 0.7–4.0)
MCH: 32.6 pg (ref 26.0–34.0)
MCHC: 33.9 g/dL (ref 30.0–36.0)
MCV: 96.2 fL (ref 80.0–100.0)
Monocytes Absolute: 0.9 10*3/uL (ref 0.1–1.0)
Monocytes Relative: 6 %
Neutro Abs: 10.4 10*3/uL — ABNORMAL HIGH (ref 1.7–7.7)
Neutrophils Relative %: 77 %
Platelet Count: 244 10*3/uL (ref 150–400)
RBC: 4.2 MIL/uL — ABNORMAL LOW (ref 4.22–5.81)
RDW: 13.3 % (ref 11.5–15.5)
WBC Count: 13.7 10*3/uL — ABNORMAL HIGH (ref 4.0–10.5)
nRBC: 0 % (ref 0.0–0.2)

## 2023-08-03 LAB — RAD ONC ARIA SESSION SUMMARY
Course Elapsed Days: 3
Plan Fractions Treated to Date: 3
Plan Prescribed Dose Per Fraction: 2 Gy
Plan Total Fractions Prescribed: 35
Plan Total Prescribed Dose: 70 Gy
Reference Point Dosage Given to Date: 6 Gy
Reference Point Session Dosage Given: 2 Gy
Session Number: 3

## 2023-08-03 LAB — BASIC METABOLIC PANEL - CANCER CENTER ONLY
Anion gap: 11 (ref 5–15)
BUN: 13 mg/dL (ref 8–23)
CO2: 27 mmol/L (ref 22–32)
Calcium: 9.2 mg/dL (ref 8.9–10.3)
Chloride: 101 mmol/L (ref 98–111)
Creatinine: 0.58 mg/dL — ABNORMAL LOW (ref 0.61–1.24)
GFR, Estimated: >60 mL/min (ref >60)
Glucose, Bld: 126 mg/dL — ABNORMAL HIGH (ref 70–99)
Potassium: 3.1 mmol/L — ABNORMAL LOW (ref 3.5–5.1)
Sodium: 139 mmol/L (ref 135–145)

## 2023-08-03 MED ORDER — POTASSIUM CHLORIDE CRYS ER 20 MEQ PO TBCR: 20.0000 meq | EXTENDED_RELEASE_TABLET | Freq: Two times a day (BID) | ORAL | 1 refills | Status: AC

## 2023-08-03 MED ORDER — HEPARIN SOD (PORK) LOCK FLUSH 100 UNIT/ML IV SOLN
500.0000 [IU] | Freq: Once | INTRAVENOUS | Status: AC
  Administered 2023-08-03: 500 [IU]

## 2023-08-03 MED ORDER — SODIUM CHLORIDE 0.9% FLUSH
10.0000 mL | Freq: Once | INTRAVENOUS | Status: AC
  Administered 2023-08-03: 10 mL

## 2023-08-03 NOTE — Assessment & Plan Note (Signed)
-  Patient reported insomnia from recent developments and has been experiencing fatigue from it.    -Per patient request, I gave him a trial of Xanax to be used at bedtime as needed and explained to him that we will not continue this after he completes concurrent chemoradiation.

## 2023-08-03 NOTE — Assessment & Plan Note (Addendum)
-   He is currently on oxycodone as needed with adequate pain control.  He was educated on judicial use.

## 2023-08-03 NOTE — Assessment & Plan Note (Addendum)
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  Noted changes in voice and difficulty swallowing solid foods. No signs of lung cancer recurrence on PET scan.   He seems to have diffuse disease throughout much of his oropharynx.  This is not appearing obviously on his PET scan.  He also has right supraglottic cancer and a hypermetabolic right level 3 lymph node consistent with lymphatic spread from his head and neck primaries.  He is less likely to be a surgical candidate given the extent of his disease.  Hence recommendation made to proceed with concurrent chemoradiation and patient and his daughter are agreeable with this plan.  -He started concurrent chemoradiation with weekly cisplatin from 07/31/2023.  Tolerated first dose of cisplatin well without major side effects.  - Labs today reveal hypokalemia with potassium of 3.1.  Otherwise no dose-limiting toxicities.  Will proceed with cycle 2 of cisplatin as planned on 08/07/2023.  Cisplatin will be continued weekly during the course of radiation.  -Prescription sent for potassium supplement to his pharmacy.

## 2023-08-03 NOTE — Progress Notes (Signed)
[facility]  ONCOLOGY CLINIC PROGRESS NOTE   Patient Care Team: [name], Wayland Salinas, MD as PCP - General (Internal Medicine) Wendall Stade, MD as PCP - Cardiology (Cardiology) Therese Sarah, RN as Oncology Nurse Navigator (Oncology) Doreatha Massed, MD as Medical Oncologist (Medical Oncology) Malmfelt, Lise Auer, RN as Oncology Nurse Navigator Lonie Peak, MD as Consulting Physician (Radiation Oncology) [name], MD as Consulting Physician (Oncology) Laren Boom, DO as Consulting Physician (Otolaryngology) Lonie Peak, MD as Attending Physician (Radiation Oncology)  PATIENT NAME: Bryan Fleming   MR#: [id number] DOB: Jul 18, 1961  Date of visit: 08/03/2023   ASSESSMENT & PLAN:   Bryan Fleming is a 62 y.o.  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   Cancer of soft palate (HCC) Recent diagnosis of squamous cell carcinoma in the supraglottis, oropharynx, multifocal disease.  Noted changes in voice and difficulty swallowing solid foods. No signs of lung cancer recurrence on PET scan.   He seems to have diffuse disease throughout much of his oropharynx.  This is not appearing obviously on his PET scan.  He also has right supraglottic cancer and a hypermetabolic right level 3 lymph node consistent with lymphatic spread from his head and neck primaries.  He is less likely to be a surgical candidate given the extent of his disease.  Hence recommendation made to proceed with concurrent chemoradiation and patient and his daughter are agreeable with  this plan.  -He started concurrent chemoradiation with weekly cisplatin from 07/31/2023.  Tolerated first dose of cisplatin well without major side effects.  - Labs today reveal hypokalemia with potassium of 3.1.  Otherwise no dose-limiting toxicities.  Will proceed with cycle 2 of cisplatin as planned on 08/07/2023.  Cisplatin will be continued weekly during the course of radiation.  -Prescription sent for potassium supplement to his pharmacy.  Cancer associated pain - He is currently on oxycodone as needed with adequate pain control.  He was educated on judicial use.  Primary lung squamous cell carcinoma, left (HCC) - Please see HPI above for details and past treatments at [facility], under the direction of Dr. Ellin Saba.  -Remains in clinical remission.  Recent PET/CT showed no evidence of disease recurrence as far as lung cancer is concerned.  Will continue surveillance for now.  Nicotine dependence, cigarettes, with other nicotine-induced disorders Reports smoking a pack a day.   -Previously prescribed nicotine patches but these were not covered by his insurance.  We will prescribe him Wellbutrin or Chantix, once he completes chemoradiation treatments.  We do encourage smoking cessation for overall health and cancer prevention.  Situational anxiety -Patient reported insomnia from recent developments and has been experiencing fatigue from it.    -Per patient request, I gave him a trial of Xanax to be used at bedtime as needed and explained to him that we will not continue this after he completes concurrent chemoradiation.    I reviewed lab results and outside records for this visit and discussed relevant results with the patient. Diagnosis, plan of care and treatment options were also discussed in detail with the patient. Opportunity provided to ask questions and answers provided to his  apparent satisfaction. Provided instructions to call our clinic with any problems,  questions or concerns prior to return visit. I recommended to continue follow-up with PCP and sub-specialists. He verbalized understanding and agreed with the plan.   NCCN guidelines have been consulted in the planning of this patient's care.  I spent a total of 40 minutes during this encounter with the patient including review of chart and various tests results, discussions about plan of care and coordination of care plan.   [name], MD  08/03/2023 3:30 PM  [facility] - A DEPT OF [facility] [address] [place] [address] Dept: [phone] Dept Fax: [phone]    CHIEF COMPLAINT/ REASON FOR VISIT:   Moderate to poorly differentiated squamous cell carcinoma of the right supraglottis and oropharynx, with nodal involvement, multifocal disease   Current Treatment: Concurrent chemoradiation with weekly cisplatin to start from 07/31/2023.  INTERVAL HISTORY:    Discussed the use of AI scribe software for clinical note transcription with the patient, who gave verbal consent to proceed.   Bryan Fleming is here today for repeat clinical assessment.    He reports persistent pain and recent placement of a feeding tube. He has not yet experienced any mouth sores or difficulty swallowing. He has been managing pain with oxycodone, which has been effective. He has not experienced any nausea or vomiting since starting treatment, which he attributes to the medications he received. He has been maintaining hydration at home with water and apple juice. He also reports a history of smoking, currently about a pack a day, and has had difficulty obtaining nicotine patches due to insurance coverage issues. He also reports some tingling in his hands and feet, which he had experienced prior to the current treatment.  I have reviewed the past medical history, past surgical history, social history and family history with the  patient and they are unchanged from previous note.  HISTORY OF PRESENT ILLNESS:   I have reviewed his chart and materials related to his cancer extensively and collaborated history with the patient. Summary of oncologic history is as follows:   Patient was being followed by Dr. Ellin Saba at [facility] for history of squamous cell carcinoma of left upper lobe of lung, diagnosed in May 2022.  He underwent neoadjuvant chemotherapy with 3 cycles of carboplatin, paclitaxel and nivolumab from 01/13/2021 through 02/24/2021 based on [id number]. This was followed by left upper lobectomy and lymph node resection on 04/21/2021. Pathology consistent with invasive moderately differentiated squamous cell carcinoma, 5 cm, margins negative.  Visceral pleura not involved.  Metastatic squamous cell carcinoma in 1/2 hilar lymph nodes.  Levels 5, 11, 12, 10, 11 and 12 lymph nodes were negative.  No definitive tumor response identified.  No lymphovascular invasion.  ypT2b, ypN1. Caris testing on resection sample from 04/21/2021 shows PD-L1 TPS 1%.  No targetable mutations.  MSI stable.  TMB low.    On routine restaging CT of the chest on 09/16/2022, there was no evidence of recurrent or metastatic disease in the chest.  However there was pancreatic ductal dilatation and hence CT abdomen was recommended.   On 11/02/2022 CT abdomen with and without contrast showed 1.4 x 1.1 cm focus of hypoenhancement/ hypoattenuation in the region of the ampulla.  No discrete mass identified.  This was associated with dilation of the main pancreatic duct.  MRI/MRCP was recommended.  MRCP on 01/09/2023 showed similar findings.  Patient was referred to Dr. Meridee Score, GI for further evaluation/ERCP.  Patient canceled few appointments and eventually established with Dr. Meridee Score on 05/15/2023.  ERCP/EUS was obtained on 05/15/2023.  EGD showed obvious mass lesion noted at vallecular fold.  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  Congested major papula.  EUS showed pancreatic parenchymal abnormalities consisting of hyperechoic foci, lobularity and hyperechoic strands in the pancreatic head, genu of pancreas and pancreatic body and tail.  There was no sign of significant pathology in the CBD and in the common hepatic duct.  No malignant appearing lymph nodes.   On 06/19/2023, CT soft tissue of the neck showed 2.4 cm right supraglottic mass consistent with malignancy.  Pathologic right level 3 lymph node consistent with metastatic disease.  Asymmetric soft tissue in the left lateral upper oropharynx, indeterminate for second mucosal malignancy.  New 11 mm subsolid pulmonary nodule in the right upper lobe with additional smaller peribronchovascular groundglass densities elsewhere in the right upper lobe.  Most likely infectious or inflammatory, however attention recommended on PET/CT to exclude primary lung malignancy or metastatic disease.   Patient was referred to ENT Dr. Marene Lenz for further evaluation of incidentally found throat mass on EGD. Exam demonstrated 2 distinct exophytic lesions, one in the supraglottis and 1 in the oropharynx, both of which were concerning for primary head and neck cancer. Patient had a palpable lymph node in the right neck concerning for metastatic spread. He was taken to OR for microlaryngoscopy and biopsy on 07/04/2023.  Operative findings included exophytic mucosal mass involving the right arytenoid, aryepiglottic fold and postcricoid area.  Extension to the vallecula noted but no obvious mucosal abnormality of the epiglottis, false cords or true cords.  From, abnormal mucosal changes of the soft palate extending from the right anterior/posterior pillars to the left soft palate.  Uvula absent.  Firm palpable nodularity of the right tonsil.  Pathology from postcricoid, right arytenoid, right aryepiglottic, left soft palate, right tonsil all came back positive for invasive squamous cell  carcinoma, moderately to poorly differentiated.  p16 negative.   On 07/13/2023, staging PET/CT showed hypermetabolic right sided supraglottic mass consistent with the patient's known primary malignancy.  Hypermetabolic adjacent right level 3 lymph node consistent with metastatic disease.  No findings for distant metastatic disease involving the chest, abdomen or pelvis.  Stable surgical changes from left upper lobe lobectomy.  No findings for residual or recurrent lung cancer.  Started concurrent chemoradiation with weekly cisplatin on 07/31/2023.  Oncology History  Primary lung squamous cell carcinoma, left (HCC)  12/31/2020 Cancer Staging   Staging form: Lung, AJCC 8th Edition - Clinical stage from 12/31/2020: Stage IIIA (cT2b, cN2, cM0) - Signed by Doreatha Massed, MD on 01/01/2021 Stage prefix: Initial diagnosis   01/01/2021 Initial Diagnosis   Primary lung squamous cell carcinoma, left (HCC)   01/13/2021 - 02/24/2021 Chemotherapy   Patient is on Treatment Plan : LUNG NSCLC Carboplatin / Paclitaxel q21d x 3 cycles     Cancer of soft palate (HCC)  07/19/2023 Initial Diagnosis   Cancer of soft palate (HCC)   07/19/2023 Cancer Staging   Staging form: Pharynx - P16 Negative Oropharynx, AJCC 8th Edition - Clinical stage from 07/19/2023: Stage III (cT3, cN1, cM0, p16-) - Signed by Lonie Peak, MD on 07/19/2023 Stage prefix: Initial diagnosis   07/31/2023 -  Chemotherapy   Patient is on Treatment Plan : HEAD/NECK Cisplatin (40) q7d     Malignant neoplasm of supraglottis (HCC)  07/19/2023 Initial Diagnosis  Malignant neoplasm of supraglottis (HCC)   07/31/2023 -  Chemotherapy   Patient is on Treatment Plan : HEAD/NECK Cisplatin (40) q7d         REVIEW OF SYSTEMS:   Review of Systems - Oncology  All other pertinent systems were reviewed with the patient and are negative.  ALLERGIES: He has no known allergies.  MEDICATIONS:  Current Outpatient Medications  Medication Sig  Dispense Refill   acetaminophen (TYLENOL) 500 MG tablet Take 2 tablets (1,000 mg total) by mouth every 6 (six) hours. 45 tablet 0   albuterol (VENTOLIN HFA) 108 (90 Base) MCG/ACT inhaler Inhale 1-2 puffs into the lungs every 6 (six) hours as needed for wheezing or shortness of breath.     ALPRAZolam (XANAX) 0.5 MG tablet Take 1 tablet (0.5 mg total) by mouth at bedtime as needed for anxiety. 30 tablet 0   amLODipine (NORVASC) 5 MG tablet Take 5 mg by mouth in the morning.     celecoxib (CELEBREX) 200 MG capsule Take 1 capsule (200 mg total) by mouth 2 (two) times daily. 28 capsule 1   dexamethasone (DECADRON) 4 MG tablet Take 2 tablets (8 mg) by mouth daily x 3 days starting the day after cisplatin chemotherapy. Take with food. 30 tablet 1   Fluticasone-Umeclidin-Vilant (TRELEGY ELLIPTA) 100-62.5-25 MCG/INH AEPB Inhale 1 puff into the lungs in the morning.     gabapentin (NEURONTIN) 300 MG capsule Take 300 mg by mouth 3 (three) times daily.     lidocaine (XYLOCAINE) 2 % solution Patient: Mix 1part 2% viscous lidocaine, 1part H20. Swish & swallow 10mL of diluted mixture, before meals and at bedtime, up to QID 200 mL 3   lidocaine-prilocaine (EMLA) cream Apply to affected area once 30 g 3   Multiple Vitamin (MULTIVITAMIN WITH MINERALS) TABS tablet Take 1 tablet by mouth in the morning.     nicotine (NICODERM CQ) 14 mg/24hr patch Place 1 patch (14 mg total) onto the skin daily. 28 patch 1   omeprazole (PRILOSEC) 40 MG capsule Take 1 capsule (40 mg total) by mouth 2 (two) times daily. 60 capsule 12   ondansetron (ZOFRAN) 8 MG tablet Take 1 tablet (8 mg total) by mouth every 8 (eight) hours as needed for nausea or vomiting. Start on the third day after cisplatin. 30 tablet 1   oxyCODONE 10 MG TABS Take 1 tablet (10 mg total) by mouth every 4 (four) hours as needed for severe pain (pain score 7-10). 90 tablet 0   potassium chloride SA (KLOR-CON M) 20 MEQ tablet Take 1 tablet (20 mEq total) by mouth 2  (two) times daily. 60 tablet 1   prochlorperazine (COMPAZINE) 10 MG tablet Take 1 tablet (10 mg total) by mouth every 6 (six) hours as needed (Nausea or vomiting). 30 tablet 1   No current facility-administered medications for this visit.     VITALS:   Blood pressure 136/64, pulse 88, temperature 98.2 F (36.8 C), temperature source Temporal, resp. rate 17, weight 146 lb 6.4 oz (66.4 kg).  Wt Readings from Last 3 Encounters:  08/03/23 146 lb 6.4 oz (66.4 kg)  07/31/23 144 lb 8 oz (65.5 kg)  07/28/23 141 lb 6.4 oz (64.1 kg)    Body mass index is 21.62 kg/m.  Performance status (ECOG): 1 - Symptomatic but completely ambulatory  PHYSICAL EXAM:   Physical Exam Constitutional:      General: He is not in acute distress.    Appearance: Normal appearance.  HENT:  Head: Normocephalic and atraumatic.     Mouth/Throat:     Comments: Erythematous, subtly friable mucosa more notable in the left soft palate than the right soft palate.   Eyes:     General: No scleral icterus.    Conjunctiva/sclera: Conjunctivae normal.  Cardiovascular:     Rate and Rhythm: Normal rate and regular rhythm.     Heart sounds: Normal heart sounds.  Pulmonary:     Effort: Pulmonary effort is normal.     Breath sounds: Wheezing (scattered, bilateral) present.  Chest:     Comments: Right-sided Port-A-Cath in place, without any signs of infection Abdominal:     General: There is no distension.     Comments: Feeding tube in place  Musculoskeletal:     Right lower leg: No edema.     Left lower leg: No edema.  Neurological:     General: No focal deficit present.     Mental Status: He is alert and oriented to person, place, and time.  Psychiatric:        Mood and Affect: Mood normal.        Behavior: Behavior normal.        Thought Content: Thought content normal.       LABORATORY DATA:   I have reviewed the data as listed.  Results for orders placed or performed in visit on 08/03/23  CBC with  Differential (Cancer Center Only)  Result Value Ref Range   WBC Count 13.7 (H) 4.0 - 10.5 K/uL   RBC 4.20 (L) 4.22 - 5.81 MIL/uL   Hemoglobin 13.7 13.0 - 17.0 g/dL   HCT 95.2 84.1 - 32.4 %   MCV 96.2 80.0 - 100.0 fL   MCH 32.6 26.0 - 34.0 pg   MCHC 33.9 30.0 - 36.0 g/dL   RDW 40.1 02.7 - 25.3 %   Platelet Count 244 150 - 400 K/uL   nRBC 0.0 0.0 - 0.2 %   Neutrophils Relative % 77 %   Neutro Abs 10.4 (H) 1.7 - 7.7 K/uL   Lymphocytes Relative 17 %   Lymphs Abs 2.3 0.7 - 4.0 K/uL   Monocytes Relative 6 %   Monocytes Absolute 0.9 0.1 - 1.0 K/uL   Eosinophils Relative 0 %   Eosinophils Absolute 0.0 0.0 - 0.5 K/uL   Basophils Relative 0 %   Basophils Absolute 0.0 0.0 - 0.1 K/uL   Immature Granulocytes 0 %   Abs Immature Granulocytes 0.05 0.00 - 0.07 K/uL  Basic Metabolic Panel - Cancer Center Only  Result Value Ref Range   Sodium 139 135 - 145 mmol/L   Potassium 3.1 (L) 3.5 - 5.1 mmol/L   Chloride 101 98 - 111 mmol/L   CO2 27 22 - 32 mmol/L   Glucose, Bld 126 (H) 70 - 99 mg/dL   BUN 13 8 - 23 mg/dL   Creatinine 6.64 (L) 4.03 - 1.24 mg/dL   Calcium 9.2 8.9 - 47.4 mg/dL   GFR, Estimated >25 >95 mL/min   Anion gap 11 5 - 15  Magnesium  Result Value Ref Range   Magnesium 1.8 1.7 - 2.4 mg/dL     RADIOGRAPHIC STUDIES:  I have personally reviewed the radiological images as listed and agree with the findings in the report.  IR Gastrostomy Tube Result Date: 07/27/2023 INDICATION: 62 year old male with history of head neck cancer requiring percutaneous gastrostomy access for nutritional purposes. EXAM: 1. Fluoroscopic guided orogastric tube placement. 2. PERC PLACEMENT GASTROSTOMY MEDICATIONS: Ancef 1 gm  IV; Antibiotics were administered within 1 hour of the procedure. ANESTHESIA/SEDATION: Moderate (conscious) sedation was employed during this procedure. A total of Versed 1 mg and Fentanyl 50 mcg was administered intravenously. Moderate Sedation Time: 10 minutes. The patient's level  of consciousness and vital signs were monitored continuously by radiology nursing throughout the procedure under my direct supervision. CONTRAST:  Seven-administered into the gastric lumen. FLUOROSCOPY TIME:  Eleven mGy COMPLICATIONS: None immediate. PROCEDURE: Informed written consent was obtained from the patient after a thorough discussion of the procedural risks, benefits and alternatives. All questions were addressed. Maximal Sterile barrier Technique was utilized including caps, mask, sterile gowns, sterile gloves, sterile drape, hand hygiene and skin antiseptic. A timeout was performed prior to the initiation of the procedure. The patient was placed on the procedure table in the supine position. Pre-procedure abdominal film confirmed visualization of the transverse colon. An angled 5-French catheter was passed through the nares into the stomach. The patient was prepped and draped in usual sterile fashion. The stomach was insufflated with air via the indwelling nasogastric tube. Under fluoroscopy, a puncture site was selected and local analgesia achieved with 1% lidocaine infiltrated subcutaneously. Under fluoroscopic guidance, a gastropexy needle was passed into the stomach and the T-bar suture was released. Entry into the stomach was confirmed with fluoroscopy, aspiration of air, and injection of contrast material. This was repeated with an additional gastropexy suture (for a total of 2 fasteners). At the center of these gastropexy sutures, a dermatotomy was performed. An 18 gauge needle was passed into the stomach at the site of this dermatotomy, and position within the gastric lumen again confirmed under fluoroscopy using aspiration of air and contrast injection. An Amplatz guidewire was passed through this needle and intraluminal placement within the stomach was confirmed by fluoroscopy. The needle was removed. Over the guidewire, the percutaneous tract was dilated using a 10 mm non-compliant balloon. The  balloon was deflated, then pushed into the gastric lumen followed in concert by the 20 Fr gastrostomy tube. The retention balloon of the percutaneous gastrostomy tube was inflated with 10 mL of sterile water. The tube was withdrawn until the retention balloon was at the edge of the gastric lumen. The external bumper was brought to the abdominal wall. Contrast was injected through the gastrostomy tube, confirming intraluminal positioning. The patient tolerated the procedure well without any immediate post-procedural complications. IMPRESSION: Technically successful placement of 20 Fr gastrostomy tube. PLAN: Return in 6 months for routine gastrostomy tube exchange. Marliss Coots, MD [facility] Radiology Electronically Signed   By: Marliss Coots M.D.   On: 07/27/2023 21:08   NM PET Image Initial (PI) Skull Base To Thigh (F-18 FDG) Result Date: 07/13/2023 CLINICAL DATA:  Initial treatment strategy for squamous cell carcinoma of the larynx. History of non-small cell lung cancer status post left upper lobe lobectomy. EXAM: NUCLEAR MEDICINE PET SKULL BASE TO THIGH TECHNIQUE: 6.17 mCi F-18 FDG was injected intravenously. Full-ring PET imaging was performed from the skull base to thigh after the radiotracer. CT data was obtained and used for attenuation correction and anatomic localization. Fasting blood glucose: 107 mg/dl COMPARISON:  Prior PET-CT 12/14/2020 and neck CT 06/19/2023 FINDINGS: Mediastinal blood pool activity: SUV max 1.41 Liver activity: SUV max NA NECK: The right-sided supraglottic mass is hypermetabolic with SUV max of 8.04. The adjacent enlarged right level III lymph node is hypermetabolic with SUV max of 8.93. No other enlarged or hypermetabolic neck nodes are identified. Incidental CT findings: Age  advanced bilateral carotid artery calcifications. CHEST: No hypermetabolic mediastinal or hilar nodes. No suspicious pulmonary nodules on the CT scan. No  enlarged or hypermetabolic supraclavicular or axillary lymph nodes. Incidental CT findings: Stable surgical changes from a left upper lobe lobectomy. No findings suspicious for residual or recurrent lung cancer. Stable age advanced emphysematous changes and pulmonary scarring. Stable significant age advanced aortic and three-vessel coronary artery calcifications. The right IJ Port-A-Cath is in good position. ABDOMEN/PELVIS: No abnormal hypermetabolic activity within the liver, pancreas, adrenal glands, or spleen. No hypermetabolic lymph nodes in the abdomen or pelvis. Incidental CT findings: Age advanced vascular disease but no aneurysm. A penile prosthesis is in place. 10 mm gallstone noted. SKELETON: No findings for osseous metastatic disease. Focus of hypermetabolism noted on the patient's skin and left groin area. This is probably urine contamination. No abnormality on the CT scan is identified. Incidental CT findings: None. IMPRESSION: 1. Hypermetabolic right-sided supraglottic mass consistent with the patient's known primary malignancy. 2. Hypermetabolic adjacent right level III lymph node consistent with metastatic disease. 3. No findings for distant metastatic disease involving the chest, abdomen/pelvis or bony structures. 4. Stable surgical changes from a left upper lobe lobectomy. No findings for residual or recurrent lung cancer. 5. Age advanced vascular disease. 6. Cholelithiasis. 7. Aortic atherosclerosis and emphysema. Aortic Atherosclerosis (ICD10-I70.0). Electronically Signed   By: Rudie Meyer M.D.   On: 07/13/2023 16:21    CODE STATUS:  Code Status History     Date Active Date Inactive Code Status Order ID Comments User Context   07/27/2023 1558 07/28/2023 0516 Full Code [id number]  Bennie Dallas, MD [facility]   12/01/2022 2127 12/06/2022 1709 Full Code [id number]  Elgergawy, Leana Roe, MD ED   10/13/2022 1155 10/14/2022 1706 Full Code [id number]  [name], MD Inpatient   02/17/2022 1635  02/18/2022 1856 Full Code [id number]  [name], MD Inpatient   04/21/2021 2124 04/23/2021 2111 Full Code [id number]  Barrett, Allayne Butcher Inpatient   06/04/2020 1253 06/06/2020 1807 Full Code [id number]  Cleora Fleet, MD ED    Questions for Most Recent Historical Code Status (Order [id number])     Question Answer   By: Consent: discussion documented in EHR            No orders of the defined types were placed in this encounter.    Future Appointments  Date Time Provider Department Center  08/04/2023  3:15 PM [facility] LINAC 3 [facility] None  08/07/2023  7:30 AM [facility] INFUSION [facility] None  08/07/2023  3:15 PM [facility] [id number] [facility] None  08/08/2023  7:00 AM [facility] LINAC 4 [facility] None  08/10/2023 10:15 AM [place]edlove Blue, Blaire L, PT [id number] None  08/10/2023 11:00 AM Barron Alvine, CCC-SLP [facility]  08/10/2023 11:45 AM [facility] LINAC 3 [facility] None  08/11/2023 11:00 AM [facility] FLUSH [facility] None  08/11/2023 11:20 AM Waynetta Metheny, MD [facility] None  08/11/2023  3:15 PM [facility] [id number] [facility] None  08/14/2023  7:45 AM [facility] INFUSION [facility] None  08/14/2023  3:15 PM [facility] [id number] [facility] None  08/15/2023  3:15 PM [facility] [id number] [facility] None  08/16/2023  3:15 PM [facility] [id number] [facility] None  08/17/2023 11:15 AM [facility] FLUSH [facility] None  08/17/2023 11:40 AM Karita Dralle, MD [facility] None  08/17/2023  3:15 PM [facility] [id number] [facility] None  08/18/2023  3:15 PM [facility] [id number] [facility] None  08/21/2023  8:00 AM [facility] INFUSION [facility] None  08/21/2023  3:15 PM [facility] [id number] [facility] None  08/22/2023  3:15 PM [facility] [id number] [facility] None  08/23/2023  3:15 PM [facility] [id number] [facility] None  08/24/2023  3:15 PM [facility] [id number] [facility] None  08/25/2023 10:45 AM [facility] FLUSH [facility] None  08/25/2023  11:20 AM Zaylyn Bergdoll, MD [facility] None  08/25/2023  3:15 PM [facility] [id number] [facility] None  08/28/2023  7:30 AM [facility] INFUSION [facility] None  08/28/2023  3:15 PM [facility] [id number] [facility] None  08/29/2023  3:15 PM [facility] [id number] [facility] None  08/30/2023  3:15 PM [facility] [id number] [facility] None  08/31/2023  3:15 PM [facility] [id number] [facility] None  09/01/2023 11:00 AM [facility] FLUSH [facility] None  09/01/2023 11:20 AM Soren Pigman, MD [facility] None  09/01/2023  3:15 PM [facility] [id number] [facility] None  09/04/2023  7:30 AM [facility] INFUSION [facility] None  09/04/2023  3:15 PM [facility] [id number] [facility] None  09/05/2023  3:15 PM [facility] [id number] [facility] None  09/06/2023  3:15 PM [facility] [id number] [facility] None  09/07/2023  3:15 PM [facility] [id number] [facility] None  09/08/2023 11:00 AM [facility] FLUSH [facility] None  09/08/2023 11:20 AM Sorah Falkenstein, MD [facility] None  09/08/2023  3:15 PM [facility] [id number] [facility] None  09/11/2023  7:30 AM [facility] INFUSION [facility] None  09/11/2023  3:15 PM [facility] [id number] [facility] None  09/12/2023  3:15 PM [facility] [id number] [facility] None  09/13/2023  3:15 PM [facility] [id number] [facility] None  09/14/2023  1:00 PM AP-ACAPA NURSE [facility] None  09/14/2023  2:00 PM AP-CT 1 AP-CT IXL H  09/14/2023  3:15 PM [facility] [id number] [facility] None  09/15/2023  3:15 PM [facility] [id number] [facility] None  09/18/2023  3:15 PM [facility] [id number] [facility] None  09/19/2023  3:15 PM [facility] [id number] [facility] None  09/21/2023  3:00 PM Doreatha Massed, MD [facility] None      This document was completed utilizing speech recognition software. Grammatical errors, random word insertions, pronoun errors, and incomplete sentences are an occasional consequence of this system due to software limitations, ambient noise, and hardware  issues. Any formal questions or concerns about the content, text or information contained within the body of this dictation should be directly addressed to the provider for clarification.

## 2023-08-03 NOTE — Assessment & Plan Note (Signed)
-   Please see HPI above for details and past treatments at [facility], under the direction of Dr. Ellin Saba.  -Remains in clinical remission.  Recent PET/CT showed no evidence of disease recurrence as far as lung cancer is concerned.  Will continue surveillance for now.

## 2023-08-03 NOTE — Assessment & Plan Note (Addendum)
Reports smoking a pack a day.   -Previously prescribed nicotine patches but these were not covered by his insurance.  We will prescribe him Wellbutrin or Chantix, once he completes chemoradiation treatments.  We do encourage smoking cessation for overall health and cancer prevention.

## 2023-08-04 ENCOUNTER — Other Ambulatory Visit: Payer: Self-pay

## 2023-08-04 ENCOUNTER — Ambulatory Visit
Admission: RE | Admit: 2023-08-04 | Discharge: 2023-08-04 | Disposition: A | Discharge status: Home / Self Care | Payer: Medicare HMO | Source: Ambulatory Visit | Attending: Radiation Oncology

## 2023-08-04 ENCOUNTER — Telehealth: Payer: Self-pay

## 2023-08-04 DIAGNOSIS — C321 Malignant neoplasm of supraglottis: Secondary | ICD-10-CM | POA: Diagnosis not present

## 2023-08-04 DIAGNOSIS — Z510 Encounter for antineoplastic radiation therapy: Secondary | ICD-10-CM | POA: Diagnosis not present

## 2023-08-04 DIAGNOSIS — C328 Malignant neoplasm of overlapping sites of larynx: Secondary | ICD-10-CM | POA: Diagnosis not present

## 2023-08-04 DIAGNOSIS — C051 Malignant neoplasm of soft palate: Secondary | ICD-10-CM | POA: Diagnosis not present

## 2023-08-04 LAB — RAD ONC ARIA SESSION SUMMARY
Course Elapsed Days: 4
Plan Fractions Treated to Date: 4
Plan Prescribed Dose Per Fraction: 2 Gy
Plan Total Fractions Prescribed: 35
Plan Total Prescribed Dose: 70 Gy
Reference Point Dosage Given to Date: 8 Gy
Reference Point Session Dosage Given: 2 Gy
Session Number: 4

## 2023-08-04 MED FILL — Fosaprepitant Dimeglumine For IV Infusion 150 MG (Base Eq): INTRAVENOUS | Qty: 5 | Status: AC

## 2023-08-07 ENCOUNTER — Inpatient Hospital Stay: Payer: Medicare HMO

## 2023-08-07 ENCOUNTER — Other Ambulatory Visit: Payer: Self-pay

## 2023-08-07 ENCOUNTER — Other Ambulatory Visit: Payer: Medicare HMO

## 2023-08-07 ENCOUNTER — Ambulatory Visit
Admission: RE | Admit: 2023-08-07 | Discharge: 2023-08-07 | Disposition: A | Discharge status: Home / Self Care | Payer: Medicare HMO | Source: Ambulatory Visit | Attending: Radiation Oncology

## 2023-08-07 VITALS — BP 126/77 | HR 93 | Temp 99.0°F | Resp 20 | Wt 139.4 lb

## 2023-08-07 DIAGNOSIS — C328 Malignant neoplasm of overlapping sites of larynx: Secondary | ICD-10-CM | POA: Diagnosis not present

## 2023-08-07 DIAGNOSIS — G893 Neoplasm related pain (acute) (chronic): Secondary | ICD-10-CM

## 2023-08-07 DIAGNOSIS — C051 Malignant neoplasm of soft palate: Secondary | ICD-10-CM

## 2023-08-07 DIAGNOSIS — C321 Malignant neoplasm of supraglottis: Secondary | ICD-10-CM

## 2023-08-07 DIAGNOSIS — Z510 Encounter for antineoplastic radiation therapy: Secondary | ICD-10-CM | POA: Diagnosis not present

## 2023-08-07 DIAGNOSIS — Z5111 Encounter for antineoplastic chemotherapy: Secondary | ICD-10-CM | POA: Diagnosis not present

## 2023-08-07 DIAGNOSIS — R232 Flushing: Secondary | ICD-10-CM

## 2023-08-07 LAB — RAD ONC ARIA SESSION SUMMARY
Course Elapsed Days: 7
Plan Fractions Treated to Date: 5
Plan Prescribed Dose Per Fraction: 2 Gy
Plan Total Fractions Prescribed: 35
Plan Total Prescribed Dose: 70 Gy
Reference Point Dosage Given to Date: 10 Gy
Reference Point Session Dosage Given: 2 Gy
Session Number: 5

## 2023-08-07 MED ORDER — POTASSIUM CHLORIDE IN NACL 20-0.9 MEQ/L-% IV SOLN
Freq: Once | INTRAVENOUS | Status: AC
  Filled 2023-08-07: qty 1000

## 2023-08-07 MED ORDER — SODIUM CHLORIDE 0.9 % IV SOLN
150.0000 mg | Freq: Once | INTRAVENOUS | Status: AC
  Administered 2023-08-07: 150 mg via INTRAVENOUS
  Filled 2023-08-07: qty 150

## 2023-08-07 MED ORDER — CISPLATIN CHEMO INJECTION 100MG/100ML
40.0000 mg/m2 | Freq: Once | INTRAVENOUS | Status: AC
  Administered 2023-08-07: 70 mg via INTRAVENOUS
  Filled 2023-08-07: qty 70

## 2023-08-07 MED ORDER — PALONOSETRON HCL INJECTION 0.25 MG/5ML
0.2500 mg | Freq: Once | INTRAVENOUS | Status: AC
Start: 2023-08-07 — End: 2023-08-07
  Administered 2023-08-07: 0.25 mg via INTRAVENOUS
  Filled 2023-08-07: qty 5

## 2023-08-07 MED ORDER — HEPARIN SOD (PORK) LOCK FLUSH 100 UNIT/ML IV SOLN
500.0000 [IU] | Freq: Once | INTRAVENOUS | Status: AC | PRN
  Administered 2023-08-07: 500 [IU]

## 2023-08-07 MED ORDER — SODIUM CHLORIDE 0.9 % IV SOLN
INTRAVENOUS | Status: DC
Start: 2023-08-07 — End: 2023-08-07

## 2023-08-07 MED ORDER — SODIUM CHLORIDE 0.9% FLUSH
10.0000 mL | INTRAVENOUS | Status: DC | PRN
Start: 2023-08-07 — End: 2023-08-07
  Administered 2023-08-07: 10 mL

## 2023-08-07 MED ORDER — DEXAMETHASONE SODIUM PHOSPHATE 10 MG/ML IJ SOLN
10.0000 mg | Freq: Once | INTRAMUSCULAR | Status: AC
  Administered 2023-08-07: 10 mg via INTRAVENOUS
  Filled 2023-08-07: qty 1

## 2023-08-07 MED ORDER — MAGNESIUM SULFATE 2 GM/50ML IV SOLN
2.0000 g | Freq: Once | INTRAVENOUS | Status: AC
  Administered 2023-08-07: 2 g via INTRAVENOUS
  Filled 2023-08-07: qty 50

## 2023-08-07 MED ORDER — OXYCODONE HCL 5 MG PO TABS
10.0000 mg | ORAL_TABLET | Freq: Once | ORAL | Status: AC
  Administered 2023-08-07: 10 mg via ORAL
  Filled 2023-08-07: qty 2

## 2023-08-07 NOTE — Progress Notes (Signed)
Oncology Nurse Navigator Documentation   I met with [name] in infusion today to remove his remaining t-tacs to his PET tube. He had one remaining t tac which was removed without difficulty. There was brown drainage present around his PEG which I cleansed with water and then placed a new dressing. He tells me that he is changing and flushing his PEG daily. Bryan Fleming RD was there at the same time and he has been advised to begin osmolite tube feedings as directed. He knows to call me if I can help with anything else.  Hedda Slade RN, BSN, OCN Head & Neck Oncology Nurse Navigator [facility] Phone # [phone]  Fax # [phone]

## 2023-08-07 NOTE — Progress Notes (Signed)
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  Patient receiving concurrent chemotherapy of cisplatin and radiation.  PEG (20 Fr) placed on 12/19.    Met with patient today during infusion.  Patient eating soft foods (soups, eggs, bologna sandwich) due to odynophagia. Limited oral intake due to odynophagia.  Anticipate long term need of feeding tube due to side effects from radiation with ongoing treatment.  Unable to afford ensure/boost shakes.    Medications: potassium chloride, ondansetron, MVI, dexamethasone, prochlorperazine  Labs: K 3.1, glucose 126, creatinine 0.58  Anthropometrics:   Weight 139 lb 6.7 oz today, decreased  144 lb 8 oz on 12/23 141 lb on 12/19 UBW of 152 lb   3% weight loss in 1 week, significant  Estimated Energy Needs  Kcals: 2000-2300 Protein: 78-98 g Fluid: > 2 L  NUTRITION DIAGNOSIS: Sub optimal energy intake ongoing with ongoing radiation treatment  INTERVENTION:  Recommend starting osmolite 1.5 via feeding tube for additional calories and protein.  Patient will give 1 carton of osmolite 1.5 with 60 cc water flush before and after feeding today.  Tomorrow, 12/31 will give 2 cartons of osmolite 1.5 and on 1/1 will give 3 cartons and continue 3 cartons until seen by RD on 1/6.  Patient has case of osmolite 1.5 at home.   Written instructions given to patient regarding tube feeding regimen today.   Verbalized understanding.     MONITORING, EVALUATION, GOAL: weight trends, intake, tube feeding   NEXT VISIT: Monday, Jan 6 during infusion  Zerina Hallinan B. Freida Busman, RD, LDN Registered Dietitian [phone]

## 2023-08-07 NOTE — Patient Instructions (Signed)
 [facility] - A DEPT OF [facility]  Discharge Instructions: Thank you for choosing [facility] to provide your oncology and hematology care.   If you have a lab appointment with the Cancer Center, please go directly to the Cancer Center and check in at the registration area.   Wear comfortable clothing and clothing appropriate for easy access to any Portacath or PICC line.   We strive to give you quality time with your provider. You may need to reschedule your appointment if you arrive late (15 or more minutes).  Arriving late affects you and other patients whose appointments are after yours.  Also, if you miss three or more appointments without notifying the office, you may be dismissed from the clinic at the provider's discretion.      For prescription refill requests, have your pharmacy contact our office and allow 72 hours for refills to be completed.    Today you received the following chemotherapy and/or immunotherapy agents: Cisplatin.       To help prevent nausea and vomiting after your treatment, we encourage you to take your nausea medication as directed.  BELOW ARE SYMPTOMS THAT SHOULD BE REPORTED IMMEDIATELY: *FEVER GREATER THAN 100.4 F (38 C) OR HIGHER *CHILLS OR SWEATING *NAUSEA AND VOMITING THAT IS NOT CONTROLLED WITH YOUR NAUSEA MEDICATION *UNUSUAL SHORTNESS OF BREATH *UNUSUAL BRUISING OR BLEEDING *URINARY PROBLEMS (pain or burning when urinating, or frequent urination) *BOWEL PROBLEMS (unusual diarrhea, constipation, pain near the anus) TENDERNESS IN MOUTH AND THROAT WITH OR WITHOUT PRESENCE OF ULCERS (sore throat, sores in mouth, or a toothache) UNUSUAL RASH, SWELLING OR PAIN  UNUSUAL VAGINAL DISCHARGE OR ITCHING   Items with * indicate a potential emergency and should be followed up as soon as possible or go to the Emergency Department if any problems should occur.  Please show the CHEMOTHERAPY ALERT CARD or  IMMUNOTHERAPY ALERT CARD at check-in to the Emergency Department and triage nurse.  Should you have questions after your visit or need to cancel or reschedule your appointment, please contact [facility] - A DEPT OF [facility]  Dept: [phone]  and follow the prompts.  Office hours are 8:00 a.m. to 4:30 p.m. Monday - Friday. Please note that voicemails left after 4:00 p.m. may not be returned until the following business day.  We are closed weekends and major holidays. You have access to a nurse at all times for urgent questions. Please call the main number to the clinic Dept: [phone] and follow the prompts.   For any non-urgent questions, you may also contact your provider using MyChart. We now offer e-Visits for anyone 11 and older to request care online for non-urgent symptoms. For details visit [email or web address].   Also download the MyChart app! Go to the app store, search "MyChart", open the app, select [place], and log in with your MyChart username and password.

## 2023-08-08 ENCOUNTER — Other Ambulatory Visit: Payer: Self-pay

## 2023-08-08 ENCOUNTER — Ambulatory Visit
Admission: RE | Admit: 2023-08-08 | Discharge: 2023-08-08 | Disposition: A | Discharge status: Home / Self Care | Payer: Medicare HMO | Source: Ambulatory Visit | Attending: Radiation Oncology

## 2023-08-08 DIAGNOSIS — C051 Malignant neoplasm of soft palate: Secondary | ICD-10-CM | POA: Diagnosis not present

## 2023-08-08 DIAGNOSIS — C328 Malignant neoplasm of overlapping sites of larynx: Secondary | ICD-10-CM | POA: Diagnosis not present

## 2023-08-08 DIAGNOSIS — Z510 Encounter for antineoplastic radiation therapy: Secondary | ICD-10-CM | POA: Diagnosis not present

## 2023-08-08 DIAGNOSIS — C321 Malignant neoplasm of supraglottis: Secondary | ICD-10-CM | POA: Diagnosis not present

## 2023-08-08 LAB — RAD ONC ARIA SESSION SUMMARY
Course Elapsed Days: 8
Plan Fractions Treated to Date: 6
Plan Prescribed Dose Per Fraction: 2 Gy
Plan Total Fractions Prescribed: 35
Plan Total Prescribed Dose: 70 Gy
Reference Point Dosage Given to Date: 12 Gy
Reference Point Session Dosage Given: 2 Gy
Session Number: 6

## 2023-08-10 ENCOUNTER — Other Ambulatory Visit: Payer: Self-pay

## 2023-08-10 ENCOUNTER — Ambulatory Visit: Payer: Medicare HMO | Attending: Radiation Oncology | Admitting: Physical Therapy

## 2023-08-10 ENCOUNTER — Encounter: Payer: Self-pay | Admitting: Physical Therapy

## 2023-08-10 ENCOUNTER — Other Ambulatory Visit: Payer: Self-pay | Admitting: Oncology

## 2023-08-10 ENCOUNTER — Encounter: Payer: Self-pay | Admitting: Oncology

## 2023-08-10 ENCOUNTER — Ambulatory Visit: Payer: Medicare HMO | Attending: Radiation Oncology

## 2023-08-10 ENCOUNTER — Ambulatory Visit
Admission: RE | Admit: 2023-08-10 | Discharge: 2023-08-10 | Disposition: A | Discharge status: Home / Self Care | Payer: Medicare HMO | Source: Ambulatory Visit | Attending: Radiation Oncology | Admitting: Radiation Oncology

## 2023-08-10 DIAGNOSIS — R293 Abnormal posture: Secondary | ICD-10-CM | POA: Insufficient documentation

## 2023-08-10 DIAGNOSIS — G893 Neoplasm related pain (acute) (chronic): Secondary | ICD-10-CM

## 2023-08-10 DIAGNOSIS — C321 Malignant neoplasm of supraglottis: Secondary | ICD-10-CM | POA: Insufficient documentation

## 2023-08-10 DIAGNOSIS — R1313 Dysphagia, pharyngeal phase: Secondary | ICD-10-CM | POA: Insufficient documentation

## 2023-08-10 DIAGNOSIS — Z510 Encounter for antineoplastic radiation therapy: Secondary | ICD-10-CM | POA: Diagnosis not present

## 2023-08-10 DIAGNOSIS — C328 Malignant neoplasm of overlapping sites of larynx: Secondary | ICD-10-CM | POA: Insufficient documentation

## 2023-08-10 DIAGNOSIS — C051 Malignant neoplasm of soft palate: Secondary | ICD-10-CM | POA: Diagnosis not present

## 2023-08-10 LAB — RAD ONC ARIA SESSION SUMMARY
Course Elapsed Days: 10
Plan Fractions Treated to Date: 7
Plan Prescribed Dose Per Fraction: 2 Gy
Plan Total Fractions Prescribed: 35
Plan Total Prescribed Dose: 70 Gy
Reference Point Dosage Given to Date: 14 Gy
Reference Point Session Dosage Given: 2 Gy
Session Number: 7

## 2023-08-10 MED ORDER — OXYCODONE HCL 10 MG PO TABS: 10.0000 mg | ORAL_TABLET | ORAL | 0 refills | Status: DC | PRN

## 2023-08-10 NOTE — Patient Instructions (Signed)
 SWALLOWING EXERCISES Do these 5-6 days/week until 6 months after your last day of radiation, then 2 days per week afterwards You can use 1-2 drops of liquid to help you swallow, if your mouth gets dry  Effortful Swallows - Press your tongue against the roof of your mouth for 3 seconds, then swallow as hard as you can - Do at least 20 reps/day, in sets of 5-10  Masako Swallow - swallow with your tongue sticking out - Stick tongue out past your lips and gently bite tongue with your teeth - Swallow, while holding your tongue with your teeth - Do at least 20 reps/day, in sets of 5-10   Shaker Exercise - head lift - Lie flat on your back in your bed, the floor, or a couch  - Raise your head and look at your feet - KEEP YOUR SHOULDERS DOWN - HOLD FOR 45-60 SECONDS, then lower your head back down - Repeat 3 times, 2-3 times a day  [employer] - "squeeze swallow" exercise - Swallow, and squeeze tight to keep your Adam's Apple up - Hold the squeeze for 5-7 seconds - then relax - Do at least 20 reps/day, in sets of 5-10

## 2023-08-10 NOTE — Therapy (Signed)
 OUTPATIENT PHYSICAL THERAPY HEAD AND NECK BASELINE EVALUATION   Patient Name: Bryan Fleming MRN: [id number] DOB:07-29-1961, 63 y.o., male Today's Date: 08/10/2023  END OF SESSION:  PT End of Session - 08/10/23 1040     Visit Number 1    Number of Visits 2    Date for PT Re-Evaluation 10/19/23    PT Start Time 1023    PT Stop Time 1040    PT Time Calculation (min) 17 min    Activity Tolerance Patient tolerated treatment well    Behavior During Therapy [facility] for tasks assessed/performed             Past Medical History:  Diagnosis Date   Cancer (HCC)    Lung cancer   COPD (chronic obstructive pulmonary disease) (HCC) 2007   EMPHYSEMA   Depression    Dyspnea    occasional - no oxygen   GERD (gastroesophageal reflux disease) 2010   Hepatitis    Alcoholic    HLD (hyperlipidemia)    no meds   Hypertension    Port-A-Cath in place 01/01/2021   Past Surgical History:  Procedure Laterality Date   BIOPSY  05/15/2023   Procedure: BIOPSY;  Surgeon: Wilhelmenia Aloha Raddle., MD;  Location: [facility];  Service: Gastroenterology;;   BRONCHIAL BIOPSY  12/23/2020   Procedure: BRONCHIAL BIOPSIES;  Surgeon: Shelah Lamar RAMAN, MD;  Location[facility];  Service: Pulmonary;;   BRONCHIAL BRUSHINGS  12/23/2020   Procedure: BRONCHIAL BRUSHINGS;  Surgeon: Shelah Lamar RAMAN, MD;  Location: [facility] ENDOSCOPY;  Service: Pulmonary;;   BRONCHIAL NEEDLE ASPIRATION BIOPSY  12/23/2020   Procedure: BRONCHIAL NEEDLE ASPIRATION BIOPSIES;  Surgeon: Shelah Lamar RAMAN, MD;  Location[facility] END[facility]COPY;  Service: Pulmonary;;   BRONCHIAL WASHINGS  12/23/2020   Procedure: BRONCHIAL WASHINGS;  Surgeon: Shelah Lamar RAMAN, MD;  Location: [facility] ENDOSCOPY;  Service: Pulmonary;;   CARDIAC CATHETERIZATION  2010   COLONOSCOPY  03/14/2012   Procedure: COLONOSCOPY;  Surgeon: Claudis RAYMOND Rivet, MD;  [facility];  Service: Endoscopy;  Laterality: N/A;  100   ESOPHAGOGASTRODUODENOSCOPY (EGD) WITH PROPOFOL  N/A  05/15/2023   Procedure: ESOPHAGOGASTRODUODENOSCOPY (EGD) WITH PROPOFOL ;  Surgeon: Wilhelmenia Aloha Raddle., MD;  Location: [facility];  Service: Gastroenterology;  Laterality: N/A;   EUS N/A 05/15/2023   Procedure: UPPER ENDOSCOPIC ULTRASOUND (EUS) RADIAL;  Surgeon: Wilhelmenia Aloha Raddle., MD;  Location: [facility];  Service: Gastroenterology;  Laterality: N/A;   INTERCOSTAL NERVE BLOCK  04/21/2021   Procedure: INTERCOSTAL NERVE BLOCK;  Surgeon: Kerrin Elspeth BROCKS, MD;  Location: [facility] OR;  Service: Thoracic;;   IR GASTROSTOMY TUBE MOD SED  07/27/2023   IR IMAGING GUIDED PORT INSERTION  01/12/2021   LARYNGOSCOPY N/A 07/04/2023   Procedure: LARYNGOSCOPY WITH BIOPSIES;  Surgeon: Llewellyn Gerard LABOR, DO;  Location: [facility];  Service: ENT;  Laterality: N/A;   LUNG REMOVAL, PARTIAL  08/2021   PENILE PROSTHESIS IMPLANT N/A 02/17/2022   Procedure: PENILE PROTHESIS INFLATABLE;  Surgeon: Sherrilee Belvie CROME, MD;  Location: [facility];  Service: Urology;  Laterality: N/A;   PORTACATH PLACEMENT  07/2021   REMOVAL OF PENILE PROSTHESIS N/A 10/13/2022   Procedure: REMOVAL OF PENILE PROSTHESIS;  Surgeon: Sherrilee Belvie CROME, MD;  Location: [facility];  Service: Urology;  Laterality: N/A;   REMOVAL OF PENILE PROSTHESIS N/A 12/05/2022   Procedure: REMOVAL AND REPLACEMENT OF PENILE PROSTHESIS;  Surgeon: Lovie Arlyss CROME, MD;  Location: [facility];  Service: Urology;  Laterality: N/A;  120 MINUTES NEEDED FOR CASE  VIDEO ASSISTED THORACOSCOPY (VATS)/ LOBECTOMY Left 04/21/2021   Procedure: VIDEO ASSISTED THORACOSCOPY (VATS)/ LOBECTOMY;  Surgeon: Kerrin Elspeth BROCKS, MD;  Location: [facility] OR;  Service: Thoracic;  Laterality: Left;   VIDEO BRONCHOSCOPY WITH ENDOBRONCHIAL NAVIGATION N/A 12/23/2020   Procedure: VIDEO BRONCHOSCOPY WITH ENDOBRONCHIAL NAVIGATION;  Surgeon: Shelah Lamar RAMAN, MD;  Location: [facility] ENDOSCOPY;  Service: Pulmonary;  Laterality: N/A;   WRIST GANGLION EXCISION  1985   Patient Active  Problem List   Diagnosis Date Noted   Flush 07/28/2023   Cancer associated pain 07/24/2023   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) 07/19/2023   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   Malignant neoplasm of supraglottis (HCC) 07/19/2023   Lesion of larynx 07/04/2023   Dilation of pancreatic duct 05/15/2023   Multiple gastric ulcers 05/15/2023   Gastritis and gastroduodenitis 05/15/2023   Wound dehiscence 12/01/2022   Malfunction of penile prosthesis (HCC) 10/13/2022   Erectile dysfunction 02/17/2022   Lung mass 04/21/2021   S/P Left Video Assisted Thorascopy with Left Upper Lobectomy, Lypmh Node Dissection, Intercostal Nerve block 04/21/2021   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   Joint pain 02/03/2021   Nausea without vomiting 02/03/2021   Primary lung squamous cell carcinoma, left (HCC) 01/01/2021   Port-A-Cath in place 01/01/2021   Cavitating mass in left upper lung lobe 12/17/2020   Acute pancreatitis 06/04/2020   Leukocytosis 06/04/2020   Hypokalemia 06/04/2020   Elevated liver function tests 02/02/2016   Absolute anemia 02/02/2016   Alcohol  abuse 08/30/2015   Situational anxiety 08/30/2015   Nicotine  dependence, cigarettes, with other nicotine -induced disorders 08/30/2015   Hepatitis 08/31/2011   Hypertension    GERD (gastroesophageal reflux disease)    COPD (chronic obstructive pulmonary disease) (HCC)     PCP: Benita Trixie Outhouse, MD  REFERRING PROVIDER: Lauraine Golden, MD  REFERRING DIAG: C32.8 (ICD-10-CM) - 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 He presented for an EGD/EUS on 05/15/23 to evaluate a possible ampullary process. Procedural findings incidentally revealed a possible mass/lesion on the vallecular fold and corniculate cartilage. 06/14/23 He was seen by Dr. Llewellyn for further evaluation and management. During which time, the patient endorsed mild trouble swallowing and a slight change in his voice. Laryngoscopy performed at that time revealed a exophytic mass lesion involving the right false vocal fold/arytenoid/aryepiglottic fold, partially obscuring the view of the right true cord, but with vocal fold mobility preserved. Physical exam performed was also notable for a small palpable lymph node in the right neck which was non-enlarged. 06/19/23 CT neck completed which revealed the 2.4 cm right supraglottic mass consistent with malignancy; pathologic right level III lymph node consistent with metastatic disease; asymmetric  soft tissue in the left lateral upper oropharynx, indeterminate for a second mucosal malignancy; and a new 11 mm subsolid pulmonary nodule in the right upper lobe with additional smaller peri broncho vascular ground-glass densities elsewhere in the right upper lobe, which most likely correlate  with an infectious / inflammatory etiology. 07/04/23 He underwent a Laryngoscopy with biopsies of the right tonsil, left soft palate, right aryepiglottic, right aryepiglottic, right arytenoid, and post cricoid. Pathology showed moderately to poorly differentiated SCC in all the biopsy samples, p16 -. Operative findings per Dr. Llewellyn: Exophytic mucosal mass involving the right arytenoid, aryepiglottic fold, and post-cricoid area. Extension to vallecula noted, but no obvious mucosal abnormality of the epiglottis, false  cords, or true cords. Firm, abnormal mucosal changes of the soft palate, extending from the right anterior/posterior pillars to the left soft palate. Uvula absent. Firm palpable nodularity of the right tonsil. 07/12/24 PET which showed: hypermetabolic right sided supraglottic mass consistent with the patient's known primary malignancy. Pet also showed: hypermetabolic adjacent right level III lymph node consistent with metastatic disease; and stable surgical changes from the left upper lobectomy. PET otherwise showed no evidence of distant metastatic disease involving the chest, abdomen, pelvis, or bony structures.  To my eye there is low-level hypermetabolic activity in the oropharynx and asymmetry in the oropharynx on his PET/CT. He will receive 35 fractions radiation to his Laryngopharynx and bilateral neck with weekly chemotherapy. Treatment started 12/23 and will complete 09/19/23. 07/27/23 PEG/PAC. He has a history of SCC of the left upper lung diagnosed May 2022. S/p resection followed by 4 cycles of neoadjuvant chemotherapy.  COPD  PATIENT GOALS:   to be educated about the signs and symptoms of lymphedema and learn post op HEP.   PAIN:  Are you having pain? Yes: NPRS scale: 9 Pain location: throat Pain description: constant soreness Aggravating factors: swallowing Relieving factors: pain medication - oxycodone , mouthwash  PRECAUTIONS: Active CA  RED FLAGS: None   WEIGHT BEARING RESTRICTIONS: No  FALLS:  Has patient fallen in last 6 months? No Does the patient have a fear of falling that limits activity? No Is the patient reluctant to leave the house due to a fear of falling?No  LIVING ENVIRONMENT: Patient lives with: wife Lives in: House/apartment Has following equipment at home: None  OCCUPATION: unemployed, disabled  LEISURE: pt does not exercise  PRIOR LEVEL OF FUNCTION: Independent   OBJECTIVE: Note: Objective measures were completed at Evaluation unless otherwise  noted.  COGNITION: Overall cognitive status: Within functional limits for tasks assessed                  POSTURE:  Forward head and rounded shoulders posture  30 SEC SIT TO STAND: 10 reps in 30 sec without use of UEs which is  Poor for patient's age  SHOULDER AROM:   WFL   CERVICAL AROM:   Percent limited  Flexion WFL  Extension WFL  Right lateral flexion WFL  Left lateral flexion WFL  Right rotation WFL  Left rotation WFL    (Blank rows=not tested)  GAIT: Assessed: Yes Assistance needed: Independent Ambulation Distance: 10 feet Assistive Device: none Gait pattern: WFL Ambulation surface: Level  PATIENT EDUCATION:  Education details: Neck ROM, importance of posture when sitting, standing and lying down, deep breathing, walking program and importance of staying active throughout treatment, CURE article on staying active, Why exercise? flyer, lymphedema and PT info Person educated: Patient Education method: Explanation, Demonstration, Handout Education comprehension: Patient verbalized understanding and returned demonstration  HOME EXERCISE PROGRAM: Patient was instructed today in a home exercise program today for head and neck range of motion exercises. These included active cervical flexion, active cervical extension, active cervical rotation to each direction, upper trap stretch, and shoulder retraction. Patient was encouraged  to do these 2-3 times a day, holding for 5 sec each and completing for 5 reps. Pt was educated that once this becomes easier then hold the stretches for 30-60 seconds.    ASSESSMENT:  CLINICAL IMPRESSION: Pt arrives to PT with recently diagnosed squamous cell carcinoma of R supraglottis. He will receive 35 fractions radiation to his Laryngopharynx and bilateral neck with weekly chemotherapy. Treatment started 12/23 and will complete 09/19/23. Pt's cervical ROM was [facility]. Educated pt about signs and symptoms of lymphedema as well as anatomy and  physiology of lymphatic system. Educated pt in importance of staying as active as possible throughout treatment to decrease fatigue as well as head and neck ROM exercises to decrease loss of ROM. Will see pt after completion of radiation to reassess ROM and assess for lymphedema and to determine therapy needs at that time.  Pt will benefit from skilled therapeutic intervention to improve on the following deficits: Decreased knowledge of precautions and postural dysfunction.   PT treatment/interventions: ADL/self-care home management, pt/family education, therapeutic exercise.   REHAB POTENTIAL: Good  CLINICAL DECISION MAKING: Stable/uncomplicated  EVALUATION COMPLEXITY: Low   GOALS: Goals reviewed with patient? YES  LONG TERM GOALS: (STG=LTG)   Name Target Date  Goal status  1 Patient will be able to verbalize understanding of a home exercise program for cervical range of motion, posture, and walking.   Baseline:  No knowledge 08/10/2023 Achieved at eval  2 Patient will be able to verbalize understanding of proper sitting and standing posture. Baseline:  No knowledge 08/10/2023 Achieved at eval  3 Patient will be able to verbalize understanding of lymphedema risk and availability of treatment for this condition Baseline:  No knowledge 08/10/2023 Achieved at eval  4 Pt will demonstrate a return to full cervical ROM and function post operatively compared to baselines and not demonstrate any signs or symptoms of lymphedema.  Baseline: See objective measurements taken today. 10/19/23 New    PLAN:  PT FREQUENCY/DURATION: EVAL and 1 follow up appointment.   PLAN FOR NEXT SESSION: will reassess 2 weeks after completion of radiation to determine needs.  Patient will follow up at outpatient cancer rehab 2 weeks after completion of radiation.  If the patient requires physical therapy at that time, a specific plan will be dictated and sent to the referring physician for approval. The patient was  educated today on appropriate basic range of motion exercises to begin now and continue throughout radiation and educated on the signs and symptoms of lymphedema. Patient verbalized good understanding.     Physical Therapy Information for During and After Head/Neck Cancer Treatment: Lymphedema is a swelling condition that you may be at risk for in your neck and/or face if you have radiation treatment to the area and/or if you have surgery that includes removing lymph nodes.  There is treatment available for this condition and it is not life-threatening.  Contact your physician or physical therapist with concerns. An excellent resource for those seeking information on lymphedema is the National Lymphedema Network's website.  It can be accessed at www.lymphnet.org If you notice swelling in your neck or face at any time following surgery (even if it is many years from now), please contact your doctor or physical therapist to discuss this.  Lymphedema can be treated at any time but it is easier for you if it is treated early on. If you have had surgery to your neck, please check with your surgeon about how soon to start doing neck range  of motion exercises.  If you are not having surgery, I encourage you to start doing neck range of motion exercises today and continue these while undergoing treatment, UNLESS you have irritation of your skin or soft tissue that is aggravated by doing them.  These exercises are intended to help you prevent loss of range of motion and/or to gain range of motion in your neck (which can be limited by tightening effects of radiation), and NOT to aggravate these tissues if they develop sensitivities from treatment. Neck range of motion exercises should be done to the point of feeling a GENTLE, TOLERABLE stretch only.  You are encouraged to start a walking or other exercise program tomorrow and continue this as much as you are able through and after treatment.  Please feel free to  call me with any questions. Florina Lanis Carbon, PT, CLT Physical Therapist and Certified Lymphedema Therapist [facility] [address], [place], [place] [address] [phone] [email or web address]  WALKING  Walking is a great form of exercise to increase your strength, endurance and overall fitness.  A walking program can help you start slowly and gradually build endurance as you go.  Everyone's ability is different, so each person's starting point will be different.  You do not have to follow them exactly.  The are just samples. You should simply find out what's right for you and stick to that program.   In the beginning, you'll start off walking 2-3 times a day for short distances.  As you get stronger, you'll be walking further at just 1-2 times per day.  A. You Can Walk For A Certain Length Of Time Each Day    Walk 5 minutes 3 times per day.  Increase 2 minutes every 2 days (3 times per day).  Work up to 25-30 minutes (1-2 times per day).   Example:   Day 1-2 5 minutes 3 times per day   Day 7-8 12 minutes 2-3 times per day   Day 13-14 25 minutes 1-2 times per day  B. You Can Walk For a Certain Distance Each Day     Distance can be substituted for time.    Example:   3 trips to mailbox (at road)   3 trips to corner of block   3 trips around the block  C. Go to local high school and use the track.    Walk for distance ____ around track  Or time ____ minutes  D. Walk ____ Jog ____ Run ___   Why exercise?  So many benefits! Here are SOME of them: Heart health, including raising your good cholesterol level and reducing heart rate and blood pressure Lung health, including improved lung capacity It burns fats, and most of us  can stand to be leaner, whether or not we are overweight. It increases the body's natural painkillers and mood elevators, so makes you feel better. Not only makes you feel better, but look better  too Improves sleep Takes a bite out of stress May decrease your risk of many types of cancer If you are currently undergoing cancer treatment, exercise may improve your ability to tolerate treatments including chemotherapy. For everybody, it can improve your energy level. Those with cancer-related fatigue report a 40-50% reduction in this symptom when exercising regularly. If you are a survivor of breast, colon, or prostate cancer, it may decrease your risk of a recurrence. (This may hold for other cancers too, but so far we have data just for these  three types.)  How to exercise: Get your doctor's okay. Pick something you enjoy doing, like walking, Zumba, biking, swimming, or whatever. Start at low intensity and time, then gradually increase.  (See walking program handout.) Set a goal to achieve over time.  The American Cancer Society, American Heart Association, and U.S. Dept. of Health and Human Services recommend 150 minutes of moderate exercise, 75 minutes of vigorous exercise, or a combination of both per week. This should be done in episodes at least 10 minutes long, spread throughout the week.  Need help being motivated? Pick something you enjoy doing, because you'll be more inclined to stick with that activity than something that feels like a chore. Do it with a friend so that you are accountable to each other. Schedule it into your day. Place it on your calendar and keep that appointment just like you do any appointment that you make. Join an exercise group that meets at a specific time.  That way, you have to show up on time, and that makes it harder to procrastinate about doing your workout.  It also keeps you accountable--people begin to expect you to be there. Join a gym where you feel comfortable and not intimidated, at the right cost. Sign up for something that you'll need to be in shape for on a specific date, like a 1K or a 5K to walk or run, a 20 or 30 mile bike ride, a mud run  or something like that. If the date is looming, you know you'll need to train to be ready for it.  An added benefit is that many of these are fundraisers for good causes. If you've already paid for a gym membership, group exercise class or event, you might as well work out, so you haven't wasted your money!    [facility], PT 08/10/2023, 10:48 AM

## 2023-08-10 NOTE — Therapy (Addendum)
 OUTPATIENT SPEECH LANGUAGE PATHOLOGY ONCOLOGY EVALUATION   Patient Name: Bryan Fleming MRN: [id number] DOB:15-Nov-1960, 63 y.o., male Today's Date: 08/10/2023  PCP: Carlette Benita Area, MD REFERRING PROVIDER: Izell Domino, MD  END OF SESSION:  End of Session - 08/10/23 1125     Visit Number 1    Number of Visits 7    Date for SLP Re-Evaluation 11/08/23    SLP Start Time 1039    SLP Stop Time  1119    SLP Time Calculation (min) 40 min    Activity Tolerance Patient tolerated treatment well             Past Medical History:  Diagnosis Date   Cancer (HCC)    Lung cancer   COPD (chronic obstructive pulmonary disease) (HCC) 2007   EMPHYSEMA   Depression    Dyspnea    occasional - no oxygen   GERD (gastroesophageal reflux disease) 2010   Hepatitis    Alcoholic    HLD (hyperlipidemia)    no meds   Hypertension    Port-A-Cath in place 01/01/2021   Past Surgical History:  Procedure Laterality Date   BIOPSY  05/15/2023   Procedure: BIOPSY;  Surgeon: Wilhelmenia Aloha Raddle., MD;  Location: [facility];  Service: Gastroenterology;;   BRONCHIAL BIOPSY  12/23/2020   Procedure: BRONCHIAL BIOPSIES;  Surgeon: Shelah Lamar RAMAN, MD;  Location: [facility] ENDOSCOPY;  Service: Pulmonary;;   BRONCHIAL BRUSHINGS  12/23/2020   Procedure: BRONCHIAL BRUSHINGS;  Surgeon: Shelah Lamar RAMAN, MD;  Location: [facility] ENDOSCOPY;  Service: Pulmonary;;   BRONCHIAL NEEDLE ASPIRATION BIOPSY  12/23/2020   Procedure: BRONCHIAL NEEDLE ASPIRATION BIOPSIES;  Surgeon: Shelah Lamar RAMAN, MD;  Location: [facility] ENDOSCOPY;  Service: Pulmonary;;   BRONCHIAL WASHINGS  12/23/2020   Procedure: BRONCHIAL WASHINGS;  Surgeon: Shelah Lamar RAMAN, MD;  Location: [facility] ENDOSCOPY;  Service: Pulmonary;;   CARDIAC CATHETERIZATION  2010   COLONOSCOPY  03/14/2012   Procedure: COLONOSCOPY;  Surgeon: Claudis RAYMOND Rivet, MD;  Location: [facility];  Service: Endoscopy;  Laterality: N/A;  100   ESOPHAGOGASTRODUODENOSCOPY (EGD) WITH PROPOFOL   N/A 05/15/2023   Procedure: ESOPHAGOGASTRODUODENOSCOPY (EGD) WITH PROPOFOL ;  Surgeon: Wilhelmenia Aloha Raddle., MD;  Location: [facility];  Service: Gastroenterology;  Laterality: N/A;   EUS N/A 05/15/2023   Procedure: UPPER ENDOSCOPIC ULTRASOUND (EUS) RADIAL;  Surgeon: Wilhelmenia Aloha Raddle., MD;  Location: [facility];  Service: Gastroenterology;  Laterality: N/A;   INTERCOSTAL NERVE BLOCK  04/21/2021   Procedure: INTERCOSTAL NERVE BLOCK;  Surgeon: Kerrin Elspeth BROCKS, MD;  Location: [facility];  Service: Thoracic;;   IR GASTROSTOMY TUBE MOD SED  07/27/2023   IR IMAGING GUIDED PORT INSERTION  01/12/2021   LARYNGOSCOPY N/A 07/04/2023   Procedure: LARYNGOSCOPY WITH BIOPSIES;  Surgeon: Llewellyn Gerard LABOR, DO;  Location: [facility];  Service: ENT;  Laterality: N/A;   LUNG REMOVAL, PARTIAL  08/2021   PENILE PROSTHESIS IMPLANT N/A 02/17/2022   Procedure: PENILE PROTHESIS INFLATABLE;  Surgeon: Sherrilee Belvie CROME, MD;  Location: [facility];  Service: Urology;  Laterality: N/A;   PORTACATH PLACEMENT  07/2021   REMOVAL OF PENILE PROSTHESIS N/A 10/13/2022   Procedure: REMOVAL OF PENILE PROSTHESIS;  Surgeon: Sherrilee Belvie CROME, MD;  Location: [facility];  Service: Urology;  Laterality: N/A;   REMOVAL OF PENILE PROSTHESIS N/A 12/05/2022   Procedure: REMOVAL AND REPLACEMENT OF PENILE PROSTHESIS;  Surgeon: Lovie Arlyss CROME, MD;  Location: [facility];  Service: Urology;  Laterality: N/A;  120 MINUTES NEEDED FOR CASE   VIDEO  ASSISTED THORACOSCOPY (VATS)/ LOBECTOMY Left 04/21/2021   Procedure: VIDEO ASSISTED THORACOSCOPY (VATS)/ LOBECTOMY;  Surgeon: Kerrin Elspeth BROCKS, MD;  Location: [facility] OR;  Service: Thoracic;  Laterality: Left;   VIDEO BRONCHOSCOPY WITH ENDOBRONCHIAL NAVIGATION N/A 12/23/2020   Procedure: VIDEO BRONCHOSCOPY WITH ENDOBRONCHIAL NAVIGATION;  Surgeon: Shelah Lamar RAMAN, MD;  Location: [facility];  Service: Pulmonary;  Laterality: N/A;   WRIST GANGLION EXCISION  1985   Patient Active  Problem List   Diagnosis Date Noted   Flush 07/28/2023   Cancer associated pain 07/24/2023   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) 07/19/2023   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   Malignant neoplasm of supraglottis (HCC) 07/19/2023   Lesion of larynx 07/04/2023   Dilation of pancreatic duct 05/15/2023   Multiple gastric ulcers 05/15/2023   Gastritis and gastroduodenitis 05/15/2023   Wound dehiscence 12/01/2022   Malfunction of penile prosthesis (HCC) 10/13/2022   Erectile dysfunction 02/17/2022   Lung mass 04/21/2021   S/P Left Video Assisted Thorascopy with Left Upper Lobectomy, Lypmh Node Dissection, Intercostal Nerve block 04/21/2021   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   Joint pain 02/03/2021   Nausea without vomiting 02/03/2021   Primary lung squamous cell carcinoma, left (HCC) 01/01/2021   Port-A-Cath in place 01/01/2021   Cavitating mass in left upper lung lobe 12/17/2020   Acute pancreatitis 06/04/2020   Leukocytosis 06/04/2020   Hypokalemia 06/04/2020   Elevated liver function tests 02/02/2016   Absolute anemia 02/02/2016   Alcohol  abuse 08/30/2015   Situational anxiety 08/30/2015   Nicotine  dependence, cigarettes, with other nicotine -induced disorders 08/30/2015   Hepatitis 08/31/2011   Hypertension    GERD (gastroesophageal reflux disease)    COPD (chronic obstructive pulmonary disease) (HCC)     ONSET DATE: October 2024   REFERRING DIAG:  C32.8 (ICD-10-CM) - 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 He presented for an EGD/EUS on 05/15/23 to evaluate a possible ampullary process. Procedural findings incidentally revealed a possible mass/lesion on the vallecular fold and corniculate cartilage.06/14/23 He was seen by Dr. Llewellyn for further evaluation and management. During which time, the patient endorsed mild trouble swallowing and a slight change in his voice. Laryngoscopy performed at that time revealed a exophytic mass lesion involving the right false vocal fold/arytenoid/aryepiglottic fold, partially obscuring the view of the right true cord, but with vocal fold mobility preserved. Physical exam performed was also notable for a small palpable lymph node in the right neck which was non-enlarged. 06/19/23 CT neck completed which revealed the 2.4 cm right supraglottic mass consistent with malignancy; pathologic right level III lymph node consistent with metastatic disease; asymmetric  soft tissue in the left lateral upper oropharynx, indeterminate for a second mucosal malignancy; and a new 11 mm subsolid pulmonary nodule in the right upper lobe with additional smaller peribronchovascular ground-glass densities elsewhere in the right upper lobe, which most likely correlate with an infectious / inflammatory etiology. 07/04/23 He underwent a Laryngoscopy with biopsies of the right tonsil, left soft palate, right aryepiglottic, right aryepiglottic, right arytenoid, and post cricoid. Pathology showed moderately to poorly differentiated  SCC in all the biopsy samples, p16 -. Operative findings per Dr. Llewellyn: Exophytic mucosal mass involving the right arytenoid, aryepiglottic fold, and post-cricoid area. Extension to vallecula noted, but no obvious mucosal abnormality of the epiglottis, false cords, or true cords. Firm, abnormal mucosal changes of the soft palate, extending from the right anterior/posterior pillars to the left soft palate. Uvula absent. Firm palpable nodularity  of the right tonsil. 07/12/24 PET which showed: hypermetabolic right sided supraglottic mass consistent with the patient's known primary malignancy. Pet also showed: hypermetabolic adjacent right level III lymph node consistent with metastatic disease; and stable surgical changes from the left upper lobectomy. PET otherwise showed no evidence of distant metastatic disease involving the chest, abdomen, pelvis, or bony structures.  To my eye there is low-level hypermetabolic activity in the oropharynx and asymmetry in the oropharynx on his PET/CT. Radiation and Medical oncology consult on 12/11. He will receive chemotherapy and radiation. Treatment plan: He will receive 35 fractions radiation to his Laryngopharynx and bilateral neck with weekly chemotherapy. Treatment started 12/23 and will complete 09/19/23. Pretreatment procedures: 07/27/23 placed PEG/PAC.  PAIN:  Are you having pain? Yes: NPRS scale: 9/10 Pain location: throat Pain description: sore,  Aggravating factors: coldness Relieving factors: medicine- oxycodone , lidocaine   FALLS: Has patient fallen in last 6 months?  See PT evaluation for details  LIVING ENVIRONMENT: Lives with: lives with their family Lives in: House/apartment  PLOF:  Level of assistance: Independent with ADLs, Independent with IADLs Employment: Full-time employment  PATIENT GOALS: Continue with WNL swallowing following ChRT  OBJECTIVE:  Note: Objective measures were completed at Evaluation unless otherwise noted.  COGNITION: Overall cognitive status: Within functional limits for tasks assessed  LANGUAGE: Receptive and Expressive language appeared WNL.  ORAL MOTOR EXAMINATION: Overall status: WFL  MOTOR SPEECH: Overall motor speech: Appears intact  SUBJECTIVE DYSPHAGIA REPORTS:  Date of onset: first week of December Reported symptoms:  reduced pharyngeal clearance with foods that aren't mushy/soft ; SLP suspects pt will eventually be PEG dependent over  the next 4 weeks  Current diet: Dysphagia 3 (mechanical soft) and thin liquids  Co-morbid voice changes: No  FACTORS WHICH MAY INCREASE RISK OF ADVERSE EVENT IN PRESENCE OF ASPIRATION:  General health: well appearing  Risk factors: none evident  however undergoing ChRT for supraglottic CA  CLINICAL SWALLOW ASSESSMENT:   Dentition: adequate natural dentition Vocal quality at baseline: normal Patient directly observed with POs: Yes: dysphagia 3 (soft) and thin liquids  Feeding: able to feed self Liquids provided by:  bottle Oral phase signs and symptoms:  none noted today Pharyngeal phase signs and symptoms:  none noted today                                                                                                                            TREATMENT DATE:   08/10/23 (eval): Research states the risk for dysphagia increases due to radiation and/or chemotherapy treatment due to a variety of factors,  so SLP educated the pt about the possibility of reduced/limited ability for PO intake during rad tx. SLP also educated pt regarding possible changes to swallowing musculature after rad tx, and why adherence to dysphagia HEP provided today and PO consumption was necessary to inhibit muscle fibrosis following rad tx and to mitigate muscle disuse atrophy. SLP informed pt why this would be detrimental to their swallowing status and to their pulmonary health. Pt demonstrated understanding of these things to SLP. SLP encouraged pt to safely eat and drink as deep into their radiation/chemotherapy as possible to provide the best possible long-term swallowing outcome for pt.  SLP then developed an individualized HEP for pt involving oral and pharyngeal strengthening and ROM and pt was instructed how to perform these exercises, including SLP demonstration. After SLP demonstration, pt return demonstrated each exercise. SLP ensured pt performance was correct prior to educating pt on next exercise. Pt required  usual mod cues faded to modified independent to perform HEP. Pt was instructed to complete this program 6-7 days/week, at least 20 reps a day (I sets of not <5) until 6 months after his last day of rad tx, and then x2 a week after that, indefinitely. Among other modifications for days when pt cannot functionally swallow, SLP also suggested pt to perform only non-swallowing tasks on the handout/HEP, and if necessary to cycle through the swallowing portion so the full program of exercises can be completed instead of fatiguing on one of the swallowing exercises and being unable to perform the other swallowing exercises. SLP instructed that swallowing exercises should then be added back into the regimen as pt is able to do so. Secondly, pt was told that former patients have told SLP that during their course of radiation therapy, taking prescribed pain medication just prior to performing HEP (and eating/drinking) has proven helpful in completing HEP (and eating and drinking) more regularly when going through their course of radiation treatment.    PATIENT EDUCATION: Education details: late effects head/neck radiation on swallow function, HEP procedure, and modification to HEP when difficulty experienced with swallowing during and after radiation course Person educated: Patient Education method: Explanation, Demonstration, Verbal cues, and Handouts Education comprehension: verbalized understanding, returned demonstration, verbal cues required, and needs further education   ASSESSMENT:  CLINICAL IMPRESSION: Patient is a 63 y.o. M who was seen today for assessment of swallowing as they undergo radiation/chemoradiation therapy. Today pt ate turkey sandwich and drank thin liquids without overt s/s oral or pharyngeal difficulty, however he reports s/sx of pharyngeal dysphagia with foods that are more of a regular diet consistency, c/b reduced pharyngeal clearance and thus need to engage in multiple swallows. At  this time pt swallowing is deemed WNL/WFL with Dys III items, and thin liquids. No s/sx oral dysphagia were noted, nor were s/sx of aspiration observed. There are no overt s/s aspiration PNA observed by SLP nor any reported by pt at this time. As swallowing becomes more difficult during his ChRT, he has the option of using his PEG tube for primary nutrition and hydration. Data indicate that pt's swallow ability will likely decrease over the course of radiation/chemoradiation therapy and could very well decline over time following the conclusion of that therapy due to muscle disuse atrophy and/or muscle fibrosis. Pt will cont to need to be seen by SLP in order to assess safety of PO intake, assess the need for recommending any objective swallow assessment, and ensuring pt is correctly completing the individualized HEP.  OBJECTIVE IMPAIRMENTS: include  dysphagia. These impairments are limiting patient from safety when swallowing. Factors affecting potential to achieve goals and functional outcome are  none noted today . Patient will benefit from skilled SLP services to address above impairments and improve overall function.  REHAB POTENTIAL: Good  GOALS: Goals reviewed with patient? Yes SHORT TERM GOALS: Target: 3rd total session   1. Pt will compelte HEP with modified independence in 2 sessions Baseline: Goal status: INITIAL   2.  pt will tell SLP why pt is completing HEP with modified independence Baseline:  Goal status: INITIAL   3.  pt will describe 3 overt s/s aspiration PNA with modified independence Baseline:  Goal status: INITIAL   4.  pt will tell SLP how a food journal could hasten return to a more normalized diet Baseline:  Goal status: INITIAL     LONG TERM GOALS: Target: 7th total session   1.  pt will complete HEP with independence over two visits Baseline:  Goal status: INITIAL   2.  pt will describe how to modify HEP over time, and the timeline associated with reduction  in HEP frequency with modified independence over two sessions Baseline:  Goal status: INITIAL   PLAN:   SLP FREQUENCY:  once approx every 4 weeks   SLP DURATION:  7 sessions   PLANNED INTERVENTIONS: Aspiration precaution training, Pharyngeal strengthening exercises, Diet toleration management , Trials of upgraded texture/liquids, SLP instruction and feedback, Compensatory strategies, and Patient/family education     [facility], CCC-SLP 08/10/2023, 12:36 PM

## 2023-08-10 NOTE — Addendum Note (Signed)
 Addended by: Verdie Mosher B on: 08/10/2023 12:37 PM   Modules accepted: Orders

## 2023-08-11 ENCOUNTER — Other Ambulatory Visit: Payer: Self-pay

## 2023-08-11 ENCOUNTER — Inpatient Hospital Stay (HOSPITAL_BASED_OUTPATIENT_CLINIC_OR_DEPARTMENT_OTHER): Payer: Medicare HMO | Admitting: Oncology

## 2023-08-11 ENCOUNTER — Inpatient Hospital Stay: Payer: Medicare HMO | Attending: Hematology

## 2023-08-11 ENCOUNTER — Encounter: Payer: Self-pay | Admitting: Oncology

## 2023-08-11 ENCOUNTER — Ambulatory Visit
Admission: RE | Admit: 2023-08-11 | Discharge: 2023-08-11 | Disposition: A | Discharge status: Home / Self Care | Payer: Medicare HMO | Source: Ambulatory Visit | Attending: Radiation Oncology

## 2023-08-11 VITALS — BP 130/66 | HR 81 | Temp 97.5°F | Wt 140.2 lb

## 2023-08-11 DIAGNOSIS — Z85118 Personal history of other malignant neoplasm of bronchus and lung: Secondary | ICD-10-CM | POA: Diagnosis not present

## 2023-08-11 DIAGNOSIS — K219 Gastro-esophageal reflux disease without esophagitis: Secondary | ICD-10-CM | POA: Diagnosis not present

## 2023-08-11 DIAGNOSIS — F17218 Nicotine dependence, cigarettes, with other nicotine-induced disorders: Secondary | ICD-10-CM | POA: Insufficient documentation

## 2023-08-11 DIAGNOSIS — D701 Agranulocytosis secondary to cancer chemotherapy: Secondary | ICD-10-CM | POA: Diagnosis not present

## 2023-08-11 DIAGNOSIS — C321 Malignant neoplasm of supraglottis: Secondary | ICD-10-CM

## 2023-08-11 DIAGNOSIS — F418 Other specified anxiety disorders: Secondary | ICD-10-CM | POA: Diagnosis not present

## 2023-08-11 DIAGNOSIS — C051 Malignant neoplasm of soft palate: Secondary | ICD-10-CM | POA: Diagnosis not present

## 2023-08-11 DIAGNOSIS — Z5111 Encounter for antineoplastic chemotherapy: Secondary | ICD-10-CM | POA: Insufficient documentation

## 2023-08-11 DIAGNOSIS — C3492 Malignant neoplasm of unspecified part of left bronchus or lung: Secondary | ICD-10-CM | POA: Diagnosis not present

## 2023-08-11 DIAGNOSIS — R918 Other nonspecific abnormal finding of lung field: Secondary | ICD-10-CM | POA: Insufficient documentation

## 2023-08-11 DIAGNOSIS — R232 Flushing: Secondary | ICD-10-CM

## 2023-08-11 DIAGNOSIS — I10 Essential (primary) hypertension: Secondary | ICD-10-CM | POA: Diagnosis not present

## 2023-08-11 DIAGNOSIS — G893 Neoplasm related pain (acute) (chronic): Secondary | ICD-10-CM | POA: Diagnosis not present

## 2023-08-11 DIAGNOSIS — R634 Abnormal weight loss: Secondary | ICD-10-CM | POA: Insufficient documentation

## 2023-08-11 DIAGNOSIS — D6959 Other secondary thrombocytopenia: Secondary | ICD-10-CM | POA: Diagnosis not present

## 2023-08-11 DIAGNOSIS — G4700 Insomnia, unspecified: Secondary | ICD-10-CM | POA: Diagnosis not present

## 2023-08-11 DIAGNOSIS — Z510 Encounter for antineoplastic radiation therapy: Secondary | ICD-10-CM | POA: Diagnosis not present

## 2023-08-11 DIAGNOSIS — C328 Malignant neoplasm of overlapping sites of larynx: Secondary | ICD-10-CM | POA: Diagnosis not present

## 2023-08-11 LAB — CBC WITH DIFFERENTIAL (CANCER CENTER ONLY)
Abs Immature Granulocytes: 0.04 10*3/uL (ref 0.00–0.07)
Basophils Absolute: 0 10*3/uL (ref 0.0–0.1)
Basophils Relative: 0 %
Eosinophils Absolute: 0 10*3/uL (ref 0.0–0.5)
Eosinophils Relative: 0 %
HCT: 37.8 % — ABNORMAL LOW (ref 39.0–52.0)
Hemoglobin: 13.2 g/dL (ref 13.0–17.0)
Immature Granulocytes: 0 %
Lymphocytes Relative: 5 %
Lymphs Abs: 0.6 10*3/uL — ABNORMAL LOW (ref 0.7–4.0)
MCH: 33 pg (ref 26.0–34.0)
MCHC: 34.9 g/dL (ref 30.0–36.0)
MCV: 94.5 fL (ref 80.0–100.0)
Monocytes Absolute: 0.7 10*3/uL (ref 0.1–1.0)
Monocytes Relative: 7 %
Neutro Abs: 9.5 10*3/uL — ABNORMAL HIGH (ref 1.7–7.7)
Neutrophils Relative %: 88 %
Platelet Count: 212 10*3/uL (ref 150–400)
RBC: 4 MIL/uL — ABNORMAL LOW (ref 4.22–5.81)
RDW: 13.2 % (ref 11.5–15.5)
WBC Count: 10.8 10*3/uL — ABNORMAL HIGH (ref 4.0–10.5)
nRBC: 0 % (ref 0.0–0.2)

## 2023-08-11 LAB — RAD ONC ARIA SESSION SUMMARY
Course Elapsed Days: 11
Plan Fractions Treated to Date: 8
Plan Prescribed Dose Per Fraction: 2 Gy
Plan Total Fractions Prescribed: 35
Plan Total Prescribed Dose: 70 Gy
Reference Point Dosage Given to Date: 16 Gy
Reference Point Session Dosage Given: 2 Gy
Session Number: 8

## 2023-08-11 LAB — BASIC METABOLIC PANEL - CANCER CENTER ONLY
Anion gap: 9 (ref 5–15)
BUN: 13 mg/dL (ref 8–23)
CO2: 25 mmol/L (ref 22–32)
Calcium: 9.3 mg/dL (ref 8.9–10.3)
Chloride: 101 mmol/L (ref 98–111)
Creatinine: 0.55 mg/dL — ABNORMAL LOW (ref 0.61–1.24)
GFR, Estimated: >60 mL/min (ref >60)
Glucose, Bld: 107 mg/dL — ABNORMAL HIGH (ref 70–99)
Potassium: 4.6 mmol/L (ref 3.5–5.1)
Sodium: 135 mmol/L (ref 135–145)

## 2023-08-11 LAB — MAGNESIUM: Magnesium: 1.9 mg/dL (ref 1.7–2.4)

## 2023-08-11 MED ORDER — SODIUM CHLORIDE 0.9% FLUSH
10.0000 mL | Freq: Once | INTRAVENOUS | Status: AC
Start: 2023-08-11 — End: 2023-08-11
  Administered 2023-08-11: 10 mL

## 2023-08-11 MED ORDER — HEPARIN SOD (PORK) LOCK FLUSH 100 UNIT/ML IV SOLN
500.0000 [IU] | Freq: Once | INTRAVENOUS | Status: AC
Start: 2023-08-11 — End: 2023-08-11
  Administered 2023-08-11: 500 [IU]

## 2023-08-11 MED FILL — Fosaprepitant Dimeglumine For IV Infusion 150 MG (Base Eq): INTRAVENOUS | Qty: 5 | Status: AC

## 2023-08-11 NOTE — Assessment & Plan Note (Addendum)
-  Patient reported insomnia from recent developments and has been experiencing fatigue from it.    -Per patient request, I previously gave him a trial of Xanax  to be used at bedtime as needed and explained to him that we will not continue this after he completes concurrent chemoradiation.

## 2023-08-11 NOTE — Progress Notes (Signed)
 [facility]  ONCOLOGY CLINIC PROGRESS NOTE   Patient Care Team: [name], Benita Area, MD as PCP - General (Internal Medicine) Delford Maude BROCKS, MD as PCP - Cardiology (Cardiology) Celestia Joesph SQUIBB, RN as Oncology Nurse Navigator (Oncology) Rogers Hai, MD as Medical Oncologist (Medical Oncology) Malmfelt, Delon CROME, RN as Oncology Nurse Navigator Izell Domino, MD as Consulting Physician (Radiation Oncology) Autumn Millman, MD as Consulting Physician (Oncology) Llewellyn Gerard LABOR, DO as Consulting Physician (Otolaryngology) Izell Domino, MD as Attending Physician (Radiation Oncology)  PATIENT NAME: Bryan Fleming   MR#: [id number] DOB: Jan 03, 1961  Date of visit: 08/11/2023   ASSESSMENT & PLAN:   TUFF CLABO is a 63 y.o.  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   Cancer of soft palate (HCC) Recent diagnosis of squamous cell carcinoma in the supraglottis, oropharynx, multifocal disease.  Noted changes in voice and difficulty swallowing solid foods. No signs of lung cancer recurrence on PET scan.   He seems to have diffuse disease throughout much of his oropharynx.  This is not appearing obviously on his PET scan.  He also has right supraglottic cancer and a hypermetabolic right level 3 lymph node consistent with lymphatic spread from his head and neck primaries.  He is less likely to be a surgical candidate given the extent of his disease.  Hence recommendation made to proceed with concurrent chemoradiation and patient and his daughter are agreeable with this  plan.  -He started concurrent chemoradiation with weekly cisplatin  from 07/31/2023.  Tolerated first dose of cisplatin  well without major side effects.  - Labs today reveal no dose-limiting toxicities.  Will proceed with cycle 3 of cisplatin  as planned on 08/14/2023.  Cisplatin  will be continued weekly during the course of radiation.   Cancer associated pain - He is currently on oxycodone  as needed with adequate pain control.  He was educated on judicial use.  Situational anxiety -Patient reported insomnia from recent developments and has been experiencing fatigue from it.    -Per patient request, I previously gave him a trial of Xanax  to be used at bedtime as needed and explained to him that we will not continue this after he completes concurrent chemoradiation.  Primary lung squamous cell carcinoma, left (HCC) - Please see HPI above for details and past treatments at [facility], under the direction of Dr. Rogers.  -Remains in clinical remission.  Recent PET/CT showed no evidence of disease recurrence as far as lung cancer is concerned.  Will continue surveillance for now.  Nicotine  dependence, cigarettes, with other nicotine -induced disorders Reports smoking a pack a day.   -Previously prescribed nicotine  patches but these were not covered by his insurance.  We will prescribe him Wellbutrin  or Chantix, once he completes chemoradiation treatments.  We do encourage smoking cessation for overall health and cancer prevention.     I reviewed lab results and outside records for this visit and discussed relevant results with the patient. Diagnosis, plan of care and treatment options were also discussed in detail with the patient. Opportunity provided to ask questions and answers provided to his apparent satisfaction. Provided instructions to call our clinic with any problems, questions or  concerns prior to return visit. I recommended to continue follow-up with PCP and  sub-specialists. He verbalized understanding and agreed with the plan.   NCCN guidelines have been consulted in the planning of this patient's care.  I spent a total of 30 minutes during this encounter with the patient including review of chart and various tests results, discussions about plan of care and coordination of care plan.   Chinita Patten, MD  08/11/2023 3:28 PM  [facility] - A DEPT [facility] 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  INTERVAL HISTORY:    Discussed the use of AI scribe software for clinical note transcription with the patient, who gave verbal consent to proceed.   Bryan Fleming is here today for repeat clinical assessment.    He reports that the treatments are going pretty well and denies any major changes since the last visit. He has been able to eat and swallow without difficulty and denies any sores, nausea, vomiting, or new numbness or tingling. He does report pre-existing numbness and tingling, but it is unclear if this is related to the cancer or its treatment.  The patient has been managing his pain with oxycodone , which he reports is doing pretty good. However, he has been struggling with sleep, both falling asleep and staying asleep. He has been taking Xanax , prescribed by his primary doctor, to help with sleep. He reports taking one tablet at night and sometimes half a tablet during the day for a nap. He is running low on his Xanax  prescription.  In addition to his cancer treatment, the patient has been dealing with personal stressors, including a family  member's health issues. He has been staying hydrated and taking a potassium supplement, which has helped to normalize his previously low potassium levels.  I have reviewed the past medical history, past surgical history, social history and family history with the patient and they are unchanged from previous note.  HISTORY OF PRESENT ILLNESS:   I have reviewed his chart and materials related to his cancer extensively and collaborated history with the patient. Summary of oncologic history is as follows:   Patient was being followed by Dr. Katragadda at [facility] for history of squamous cell carcinoma of left upper lobe of lung, diagnosed in May 2022.  He underwent neoadjuvant chemotherapy with 3 cycles of carboplatin , paclitaxel  and nivolumab  from 01/13/2021 through 02/24/2021 based on [id number]. This was followed by left upper lobectomy and lymph node resection on 04/21/2021. Pathology consistent with invasive moderately differentiated squamous cell carcinoma, 5 cm, margins negative.  Visceral pleura not involved.  Metastatic squamous cell carcinoma in 1/2 hilar lymph nodes.  Levels 5, 11, 12, 10, 11 and 12 lymph nodes were negative.  No definitive tumor response identified.  No lymphovascular invasion.  ypT2b, ypN1. Caris testing on resection sample from 04/21/2021 shows PD-L1 TPS 1%.  No targetable mutations.  MSI stable.  TMB low.    On routine restaging CT of the chest on 09/16/2022, there was no evidence of recurrent or metastatic disease in the chest.  However there was pancreatic ductal dilatation and hence CT abdomen was recommended.   On  11/02/2022 CT abdomen with and without contrast showed 1.4 x 1.1 cm focus of hypoenhancement/ hypoattenuation in the region of the ampulla.  No discrete mass identified.  This was associated with dilation of the main pancreatic duct.  MRI/MRCP was recommended.  MRCP on 01/09/2023 showed similar findings.   Patient was referred to Dr. Wilhelmenia, GI for  further evaluation/ERCP.  Patient canceled few appointments and eventually established with Dr. Wilhelmenia on 05/15/2023.  ERCP/EUS was obtained on 05/15/2023.  EGD showed obvious mass lesion noted at vallecular fold.  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  Congested major papula.  EUS showed pancreatic parenchymal abnormalities consisting of hyperechoic foci, lobularity and hyperechoic strands in the pancreatic head, genu of pancreas and pancreatic body and tail.  There was no sign of significant pathology in the CBD and in the common hepatic duct.  No malignant appearing lymph nodes.   On 06/19/2023, CT soft tissue of the neck showed 2.4 cm right supraglottic mass consistent with malignancy.  Pathologic right level 3 lymph node consistent with metastatic disease.  Asymmetric soft tissue in the left lateral upper oropharynx, indeterminate for second mucosal malignancy.  New 11 mm subsolid pulmonary nodule in the right upper lobe with additional smaller peribronchovascular groundglass densities elsewhere in the right upper lobe.  Most likely infectious or inflammatory, however attention recommended on PET/CT to exclude primary lung malignancy or metastatic disease.   Patient was referred to ENT Dr. Llewellyn for further evaluation of incidentally found throat mass on EGD. Exam demonstrated 2 distinct exophytic lesions, one in the supraglottis and 1 in the oropharynx, both of which were concerning for primary head and neck cancer. Patient had a palpable lymph node in the right neck concerning for metastatic spread. He was taken to OR for microlaryngoscopy and biopsy on 07/04/2023.  Operative findings included exophytic mucosal mass involving the right arytenoid, aryepiglottic fold and postcricoid area.  Extension to the vallecula noted but no obvious mucosal abnormality of the epiglottis, false cords or true cords.  From, abnormal mucosal changes of the soft palate extending from the right  anterior/posterior pillars to the left soft palate.  Uvula absent.  Firm palpable nodularity of the right tonsil.  Pathology from postcricoid, right arytenoid, right aryepiglottic, left soft palate, right tonsil all came back positive for invasive squamous cell carcinoma, moderately to poorly differentiated.  p16 negative.   On 07/13/2023, staging PET/CT showed hypermetabolic right sided supraglottic mass consistent with the patient's known primary malignancy.  Hypermetabolic adjacent right level 3 lymph node consistent with metastatic disease.  No findings for distant metastatic disease involving the chest, abdomen or pelvis.  Stable surgical changes from left upper lobe lobectomy.  No findings for residual or recurrent lung cancer.  Started concurrent chemoradiation with weekly cisplatin  on 07/31/2023.  Oncology History  Primary lung squamous cell carcinoma, left (HCC)  12/31/2020 Cancer Staging   Staging form: Lung, AJCC 8th Edition - Clinical stage from 12/31/2020: Stage IIIA (cT2b, cN2, cM0) - Signed by Rogers Hai, MD on 01/01/2021 Stage prefix: Initial diagnosis   01/01/2021 Initial Diagnosis   Primary lung squamous cell carcinoma, left (HCC)   01/13/2021 - 02/24/2021 Chemotherapy   Patient is on Treatment Plan : LUNG NSCLC Carboplatin  / Paclitaxel  q21d x 3 cycles     Cancer of soft palate (HCC)  07/19/2023 Initial Diagnosis   Cancer of soft palate (HCC)   07/19/2023 Cancer Staging   Staging form: Pharynx - P16 Negative Oropharynx, AJCC 8th Edition - Clinical stage from 07/19/2023:  Stage III (cT3, cN1, cM0, p16-) - Signed by Izell Domino, MD on 07/19/2023 Stage prefix: Initial diagnosis   07/31/2023 -  Chemotherapy   Patient is on Treatment Plan : HEAD/NECK Cisplatin  (40) q7d     Malignant neoplasm of supraglottis (HCC)  07/19/2023 Initial Diagnosis   Malignant neoplasm of supraglottis (HCC)   07/31/2023 -  Chemotherapy   Patient is on Treatment Plan : HEAD/NECK Cisplatin   (40) q7d         REVIEW OF SYSTEMS:   Review of Systems - Oncology  All other pertinent systems were reviewed with the patient and are negative.  ALLERGIES: He has no known allergies.  MEDICATIONS:  Current Outpatient Medications  Medication Sig Dispense Refill   albuterol  (VENTOLIN  HFA) 108 (90 Base) MCG/ACT inhaler Inhale 1-2 puffs into the lungs every 6 (six) hours as needed for wheezing or shortness of breath.     amLODipine  (NORVASC ) 5 MG tablet Take 5 mg by mouth in the morning.     dexamethasone  (DECADRON ) 4 MG tablet Take 2 tablets (8 mg) by mouth daily x 3 days starting the day after cisplatin  chemotherapy. Take with food. 30 tablet 1   Fluticasone -Umeclidin-Vilant (TRELEGY ELLIPTA) 100-62.5-25 MCG/INH AEPB Inhale 1 puff into the lungs in the morning.     loratadine (CLARITIN) 10 MG tablet Take 10 mg by mouth daily.     metoprolol  tartrate (LOPRESSOR ) 50 MG tablet Take 50 mg by mouth 2 (two) times daily.     ondansetron  (ZOFRAN ) 8 MG tablet Take 1 tablet (8 mg total) by mouth every 8 (eight) hours as needed for nausea or vomiting. Start on the third day after cisplatin . 30 tablet 1   Oxycodone  HCl 10 MG TABS Take 1 tablet (10 mg total) by mouth every 4 (four) hours as needed. 100 tablet 0   potassium chloride  SA (KLOR-CON  M) 20 MEQ tablet Take 1 tablet (20 mEq total) by mouth 2 (two) times daily. 60 tablet 1   prochlorperazine  (COMPAZINE ) 10 MG tablet Take 1 tablet (10 mg total) by mouth every 6 (six) hours as needed (Nausea or vomiting). 30 tablet 1   sodium fluoride  (FLUORISHIELD) 1.1 % GEL dental gel Place 1 Application onto teeth at bedtime.     acetaminophen  (TYLENOL ) 500 MG tablet Take 2 tablets (1,000 mg total) by mouth every 6 (six) hours. (Patient not taking: Reported on 08/11/2023) 45 tablet 0   ALPRAZolam  (XANAX ) 0.5 MG tablet Take 1 tablet (0.5 mg total) by mouth at bedtime as needed for anxiety. (Patient not taking: Reported on 08/11/2023) 30 tablet 0   lidocaine   (XYLOCAINE ) 2 % solution Patient: Mix 1part 2% viscous lidocaine , 1part H20. Swish & swallow 10mL of diluted mixture, before meals and at bedtime, up to QID (Patient not taking: Reported on 08/11/2023) 200 mL 3   lidocaine -prilocaine  (EMLA ) cream Apply to affected area once (Patient not taking: Reported on 08/11/2023) 30 g 3   Multiple Vitamin (MULTIVITAMIN WITH MINERALS) TABS tablet Take 1 tablet by mouth in the morning. (Patient not taking: Reported on 08/11/2023)     nicotine  (NICODERM CQ ) 14 mg/24hr patch Place 1 patch (14 mg total) onto the skin daily. (Patient not taking: Reported on 08/11/2023) 28 patch 1   omeprazole  (PRILOSEC) 40 MG capsule Take 1 capsule (40 mg total) by mouth 2 (two) times daily. (Patient not taking: Reported on 08/11/2023) 60 capsule 12   No current facility-administered medications for this visit.     VITALS:   Blood pressure  130/66, pulse 81, temperature (!) 97.5 F (36.4 C), temperature source Temporal, weight 140 lb 3.2 oz (63.6 kg), SpO2 91%.  Wt Readings from Last 3 Encounters:  08/11/23 140 lb 3.2 oz (63.6 kg)  08/07/23 139 lb 6.7 oz (63.2 kg)  08/03/23 146 lb 6.4 oz (66.4 kg)    Body mass index is 20.7 kg/m.  Performance status (ECOG): 1 - Symptomatic but completely ambulatory  PHYSICAL EXAM:   Physical Exam Constitutional:      General: He is not in acute distress.    Appearance: Normal appearance.  HENT:     Head: Normocephalic and atraumatic.     Mouth/Throat:     Comments: Erythematous, subtly friable mucosa more notable in the left soft palate than the right soft palate.   Eyes:     General: No scleral icterus.    Conjunctiva/sclera: Conjunctivae normal.  Cardiovascular:     Rate and Rhythm: Normal rate and regular rhythm.     Heart sounds: Normal heart sounds.  Pulmonary:     Effort: Pulmonary effort is normal.     Breath sounds: Wheezing (scattered, bilateral) present.  Chest:     Comments: Right-sided Port-A-Cath in place, without  any signs of infection Abdominal:     General: There is no distension.     Comments: Feeding tube in place  Musculoskeletal:     Right lower leg: No edema.     Left lower leg: No edema.  Neurological:     General: No focal deficit present.     Mental Status: He is alert and oriented to person, place, and time.  Psychiatric:        Mood and Affect: Mood normal.        Behavior: Behavior normal.        Thought Content: Thought content normal.       LABORATORY DATA:   I have reviewed the data as listed.  Results for orders placed or performed in visit on 08/11/23  Rad Onc Aria Session Summary  Result Value Ref Range   Course ID [id number]    Course Start Date 07/21/2023    Session Number 8    Course First Treatment Date 07/31/2023  2:33 PM    Course Last Treatment Date 08/11/2023 12:44 PM    Course Elapsed Days 11    Reference Point ID HN_Throat dp    Reference Point Dosage Given to Date 16 Gy   Reference Point Session Dosage Given 2 Gy   Plan ID HN_Throat    Plan Name HN_Throat    Plan Fractions Treated to Date 8    Plan Total Fractions Prescribed 35    Plan Prescribed Dose Per Fraction 2 Gy   Plan Total Prescribed Dose 70.000000 Gy   Plan Primary Reference Point HN_Throat dp   Results for orders placed or performed in visit on 08/11/23  Basic Metabolic Panel - Cancer Center Only  Result Value Ref Range   Sodium 135 135 - 145 mmol/L   Potassium 4.6 3.5 - 5.1 mmol/L   Chloride 101 98 - 111 mmol/L   CO2 25 22 - 32 mmol/L   Glucose, Bld 107 (H) 70 - 99 mg/dL   BUN 13 8 - 23 mg/dL   Creatinine 9.44 (L) 9.38 - 1.24 mg/dL   Calcium 9.3 8.9 - 89.6 mg/dL   GFR, Estimated >39 >39 mL/min   Anion gap 9 5 - 15  Magnesium   Result Value Ref Range   Magnesium  1.9 1.7 -  2.4 mg/dL  CBC with Differential (Cancer Center Only)  Result Value Ref Range   WBC Count 10.8 (H) 4.0 - 10.5 K/uL   RBC 4.00 (L) 4.22 - 5.81 MIL/uL   Hemoglobin 13.2 13.0 - 17.0 g/dL   HCT 62.1 (L) 60.9 - 47.9 %    MCV 94.5 80.0 - 100.0 fL   MCH 33.0 26.0 - 34.0 pg   MCHC 34.9 30.0 - 36.0 g/dL   RDW 86.7 88.4 - 84.4 %   Platelet Count 212 150 - 400 K/uL   nRBC 0.0 0.0 - 0.2 %   Neutrophils Relative % 88 %   Neutro Abs 9.5 (H) 1.7 - 7.7 K/uL   Lymphocytes Relative 5 %   Lymphs Abs 0.6 (L) 0.7 - 4.0 K/uL   Monocytes Relative 7 %   Monocytes Absolute 0.7 0.1 - 1.0 K/uL   Eosinophils Relative 0 %   Eosinophils Absolute 0.0 0.0 - 0.5 K/uL   Basophils Relative 0 %   Basophils Absolute 0.0 0.0 - 0.1 K/uL   Immature Granulocytes 0 %   Abs Immature Granulocytes 0.04 0.00 - 0.07 K/uL     RADIOGRAPHIC STUDIES:  I have personally reviewed the radiological images as listed and agree with the findings in the report.  IR Gastrostomy Tube Result Date: 07/27/2023 INDICATION: 63 year old male with history of head neck cancer requiring percutaneous gastrostomy access for nutritional purposes. EXAM: 1. Fluoroscopic guided orogastric tube placement. 2. PERC PLACEMENT GASTROSTOMY MEDICATIONS: Ancef  1 gm IV; Antibiotics were administered within 1 hour of the procedure. ANESTHESIA/SEDATION: Moderate (conscious) sedation was employed during this procedure. A total of Versed  1 mg and Fentanyl  50 mcg was administered intravenously. Moderate Sedation Time: 10 minutes. The patient's level of consciousness and vital signs were monitored continuously by radiology nursing throughout the procedure under my direct supervision. CONTRAST:  Seven-administered into the gastric lumen. FLUOROSCOPY TIME:  Eleven mGy COMPLICATIONS: None immediate. PROCEDURE: Informed written consent was obtained from the patient after a thorough discussion of the procedural risks, benefits and alternatives. All questions were addressed. Maximal Sterile barrier Technique was utilized including caps, mask, sterile gowns, sterile gloves, sterile drape, hand hygiene and skin antiseptic. A timeout was performed prior to the initiation of the procedure. The  patient was placed on the procedure table in the supine position. Pre-procedure abdominal film confirmed visualization of the transverse colon. An angled 5-French catheter was passed through the nares into the stomach. The patient was prepped and draped in usual sterile fashion. The stomach was insufflated with air via the indwelling nasogastric tube. Under fluoroscopy, a puncture site was selected and local analgesia achieved with 1% lidocaine  infiltrated subcutaneously. Under fluoroscopic guidance, a gastropexy needle was passed into the stomach and the T-bar suture was released. Entry into the stomach was confirmed with fluoroscopy, aspiration of air, and injection of contrast material. This was repeated with an additional gastropexy suture (for a total of 2 fasteners). At the center of these gastropexy sutures, a dermatotomy was performed. An 18 gauge needle was passed into the stomach at the site of this dermatotomy, and position within the gastric lumen again confirmed under fluoroscopy using aspiration of air and contrast injection. An Amplatz guidewire was passed through this needle and intraluminal placement within the stomach was confirmed by fluoroscopy. The needle was removed. Over the guidewire, the percutaneous tract was dilated using a 10 mm non-compliant balloon. The balloon was deflated, then pushed into the gastric lumen followed in concert by the 20 Fr gastrostomy tube.  The retention balloon of the percutaneous gastrostomy tube was inflated with 10 mL of sterile water . The tube was withdrawn until the retention balloon was at the edge of the gastric lumen. The external bumper was brought to the abdominal wall. Contrast was injected through the gastrostomy tube, confirming intraluminal positioning. The patient tolerated the procedure well without any immediate post-procedural complications. IMPRESSION: Technically successful placement of 20 Fr gastrostomy tube. PLAN: Return in 6 months for routine  gastrostomy tube exchange. Ester Sides, MD [facility] Radiology Electronically Signed   By: Ester Sides M.D.   On: 07/27/2023 21:08   NM PET Image Initial (PI) Skull Base To Thigh (F-18 FDG) Result Date: 07/13/2023 CLINICAL DATA:  Initial treatment strategy for squamous cell carcinoma of the larynx. History of non-small cell lung cancer status post left upper lobe lobectomy. EXAM: NUCLEAR MEDICINE PET SKULL BASE TO THIGH TECHNIQUE: 6.17 mCi F-18 FDG was injected intravenously. Full-ring PET imaging was performed from the skull base to thigh after the radiotracer. CT data was obtained and used for attenuation correction and anatomic localization. Fasting blood glucose: 107 mg/dl COMPARISON:  Prior PET-CT 12/14/2020 and neck CT 06/19/2023 FINDINGS: Mediastinal blood pool activity: SUV max 1.41 Liver activity: SUV max NA NECK: The right-sided supraglottic mass is hypermetabolic with SUV max of 8.04. The adjacent enlarged right level III lymph node is hypermetabolic with SUV max of 8.93. No other enlarged or hypermetabolic neck nodes are identified. Incidental CT findings: Age advanced bilateral carotid artery calcifications. CHEST: No hypermetabolic mediastinal or hilar nodes. No suspicious pulmonary nodules on the CT scan. No enlarged or hypermetabolic supraclavicular or axillary lymph nodes. Incidental CT findings: Stable surgical changes from a left upper lobe lobectomy. No findings suspicious for residual or recurrent lung cancer. Stable age advanced emphysematous changes and pulmonary scarring. Stable significant age advanced aortic and three-vessel coronary artery calcifications. The right IJ Port-A-Cath is in good position. ABDOMEN/PELVIS: No abnormal hypermetabolic activity within the liver, pancreas, adrenal glands, or spleen. No hypermetabolic lymph nodes in the abdomen or pelvis. Incidental CT findings: Age advanced vascular disease but no aneurysm. A  penile prosthesis is in place. 10 mm gallstone noted. SKELETON: No findings for osseous metastatic disease. Focus of hypermetabolism noted on the patient's skin and left groin area. This is probably urine contamination. No abnormality on the CT scan is identified. Incidental CT findings: None. IMPRESSION: 1. Hypermetabolic right-sided supraglottic mass consistent with the patient's known primary malignancy. 2. Hypermetabolic adjacent right level III lymph node consistent with metastatic disease. 3. No findings for distant metastatic disease involving the chest, abdomen/pelvis or bony structures. 4. Stable surgical changes from a left upper lobe lobectomy. No findings for residual or recurrent lung cancer. 5. Age advanced vascular disease. 6. Cholelithiasis. 7. Aortic atherosclerosis and emphysema. Aortic Atherosclerosis (ICD10-I70.0). Electronically Signed   By: MYRTIS Stammer M.D.   On: 07/13/2023 16:21    CODE STATUS:  Code Status History     Date Active Date Inactive Code Status Order ID Comments User Context   07/27/2023 1558 07/28/2023 0516 Full Code [id number]  Sides Ester PARAS, MD [facility]   12/01/2022 2127 12/06/2022 1709 Full Code [id number]  Elgergawy, Brayton RAMAN, MD ED   10/13/2022 1155 10/14/2022 1706 Full Code [id number]  Sherrilee Belvie CROME, MD Inpatient   02/17/2022 1635 02/18/2022 1856 Full Code [id number]  Sherrilee Belvie CROME, MD Inpatient   04/21/2021 2124 04/23/2021 2111 Full Code [id number]  Barrett, Rocky SAUNDERS, PA-C Inpatient   06/04/2020 1253  06/06/2020 1807 Full Code [id number]  Vicci Afton CROME, MD ED    Questions for Most Recent Historical Code Status (Order [id number])     Question Answer   By: Consent: discussion documented in EHR            No orders of the defined types were placed in this encounter.    Future Appointments  Date Time Provider Department Center  08/14/2023  7:45 AM [facility] INFUSION [facility] None  08/14/2023  9:00 AM Dasie Simple, RD [facility] None  08/14/2023  3:15  PM [facility] LINAC 3 [facility] None  08/14/2023  3:35 PM LINAC-SQUIRE [facility] None  08/15/2023  3:15 PM [facility] LINAC 3 [facility] None  08/16/2023  3:15 PM [facility] [id number] [facility] None  08/17/2023 11:15 AM [facility] FLUSH [facility] None  08/17/2023 11:40 AM Ajamu Maxon, MD [facility] None  08/17/2023 12:00 PM [facility] [id number] [facility] None  08/18/2023  3:15 PM [facility] [id number] [facility] None  08/21/2023  8:00 AM [facility] INFUSION [facility] None  08/21/2023  3:15 PM [facility] [id number] [facility] None  08/22/2023  3:15 PM [facility] [id number] [facility] None  08/23/2023  3:15 PM [facility] [id number] [facility] None  08/24/2023  3:15 PM [facility] [id number] [facility] None  08/25/2023 10:45 AM [facility] FLUSH [facility] None  08/25/2023 11:20 AM Rodarius Kichline, MD [facility] None  08/25/2023  1:30 PM [facility] LINAC 4 [facility] None  08/28/2023  7:30 AM [facility] INFUSION [facility] None  08/28/2023  3:15 PM [facility] [id number] [facility] None  08/29/2023  3:15 PM [facility] [id number] [facility] None  08/30/2023  3:15 PM [facility] [id number] [facility] None  08/31/2023  3:15 PM [facility] [id number] [facility] None  09/01/2023 10:00 AM [facility] [id number] [facility] None  09/01/2023 11:00 AM [facility] FLUSH [facility] None  09/01/2023 11:20 AM Marguerite Jarboe, MD [facility] None  09/04/2023  7:30 AM [facility] INFUSION [facility] None  09/04/2023  3:15 PM [facility] [id number] [facility] None  09/05/2023  3:15 PM [facility] [id number] [facility] None  09/06/2023  3:15 PM [facility] [id number] [facility] None  09/07/2023  3:15 PM [facility] [id number] [facility] None  09/08/2023 10:45 AM [facility] [id number] [facility] None  09/08/2023 11:00 AM [facility] FLUSH [facility] None  09/08/2023 11:20 AM Nakiah Osgood, MD [facility] None  09/11/2023  7:30 AM [facility] INFUSION [facility] None  09/11/2023  3:15 PM [facility] [id number]  [facility] None  09/12/2023  3:15 PM [facility] [id number] [facility] None  09/13/2023  3:15 PM [facility] [id number] [facility] None  09/14/2023  1:00 PM AP-ACAPA NURSE [facility] None  09/14/2023  2:00 PM AP-CT 1 AP-CT Sylvester H  09/14/2023  3:15 PM [facility] [id number] [facility] None  09/15/2023  3:15 PM [facility] [id number] [facility] None  09/18/2023  3:15 PM [facility] [id number] [facility] None  09/19/2023  3:15 PM [facility] [id number] [facility] None  09/21/2023 11:00 AM Fayette Maxcy V, CCC-SLP [facility] None  09/21/2023  3:00 PM Rogers Hai, MD [facility] None  10/10/2023 12:00 PM Breedlove Blue, Florina CROME, PT [facility] None      This document was completed utilizing speech recognition software. Grammatical errors, random word insertions, pronoun errors, and incomplete sentences are an occasional consequence of this system due to software limitations, ambient noise, and hardware issues. Any formal questions or concerns about the content, text or information contained within the body of this dictation should be directly addressed to the provider for clarification.

## 2023-08-11 NOTE — Assessment & Plan Note (Addendum)
 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  Noted changes in voice and difficulty swallowing solid foods. No signs of lung cancer recurrence on PET scan.   He seems to have diffuse disease throughout much of his oropharynx.  This is not appearing obviously on his PET scan.  He also has right supraglottic cancer and a hypermetabolic right level 3 lymph node consistent with lymphatic spread from his head and neck primaries.  He is less likely to be a surgical candidate given the extent of his disease.  Hence recommendation made to proceed with concurrent chemoradiation and patient and his daughter are agreeable with this plan.  -He started concurrent chemoradiation with weekly cisplatin  from 07/31/2023.  Tolerated first dose of cisplatin  well without major side effects.  - Labs today reveal no dose-limiting toxicities.  Will proceed with cycle 3 of cisplatin  as planned on 08/14/2023.  Cisplatin  will be continued weekly during the course of radiation.

## 2023-08-11 NOTE — Assessment & Plan Note (Signed)
-   He is currently on oxycodone as needed with adequate pain control.  He was educated on judicial use.

## 2023-08-11 NOTE — Assessment & Plan Note (Signed)
-   Please see HPI above for details and past treatments at [facility], under the direction of Dr. Ellin Saba.  -Remains in clinical remission.  Recent PET/CT showed no evidence of disease recurrence as far as lung cancer is concerned.  Will continue surveillance for now.

## 2023-08-11 NOTE — Assessment & Plan Note (Signed)
 Reports smoking a pack a day.   -Previously prescribed nicotine patches but these were not covered by his insurance.  We will prescribe him Wellbutrin or Chantix, once he completes chemoradiation treatments.  We do encourage smoking cessation for overall health and cancer prevention.

## 2023-08-14 ENCOUNTER — Other Ambulatory Visit: Payer: Medicare HMO

## 2023-08-14 ENCOUNTER — Ambulatory Visit: Payer: Medicare HMO

## 2023-08-14 ENCOUNTER — Inpatient Hospital Stay: Payer: Medicare HMO

## 2023-08-14 ENCOUNTER — Other Ambulatory Visit: Payer: Self-pay

## 2023-08-14 ENCOUNTER — Ambulatory Visit
Admission: RE | Admit: 2023-08-14 | Discharge: 2023-08-14 | Disposition: A | Discharge status: Home / Self Care | Payer: Medicare HMO | Source: Ambulatory Visit | Attending: Radiation Oncology | Admitting: Radiation Oncology

## 2023-08-14 VITALS — BP 141/68 | HR 95 | Temp 98.1°F | Resp 18 | Wt 139.5 lb

## 2023-08-14 DIAGNOSIS — R232 Flushing: Secondary | ICD-10-CM

## 2023-08-14 DIAGNOSIS — Z510 Encounter for antineoplastic radiation therapy: Secondary | ICD-10-CM | POA: Diagnosis not present

## 2023-08-14 DIAGNOSIS — C321 Malignant neoplasm of supraglottis: Secondary | ICD-10-CM | POA: Diagnosis not present

## 2023-08-14 DIAGNOSIS — C328 Malignant neoplasm of overlapping sites of larynx: Secondary | ICD-10-CM | POA: Diagnosis not present

## 2023-08-14 DIAGNOSIS — Z5111 Encounter for antineoplastic chemotherapy: Secondary | ICD-10-CM | POA: Diagnosis not present

## 2023-08-14 DIAGNOSIS — C051 Malignant neoplasm of soft palate: Secondary | ICD-10-CM

## 2023-08-14 LAB — RAD ONC ARIA SESSION SUMMARY
Course Elapsed Days: 14
Plan Fractions Treated to Date: 9
Plan Prescribed Dose Per Fraction: 2 Gy
Plan Total Fractions Prescribed: 35
Plan Total Prescribed Dose: 70 Gy
Reference Point Dosage Given to Date: 18 Gy
Reference Point Session Dosage Given: 2 Gy
Session Number: 9

## 2023-08-14 MED ORDER — SODIUM CHLORIDE 0.9 % IV SOLN
40.0000 mg/m2 | Freq: Once | INTRAVENOUS | Status: AC
  Administered 2023-08-14: 70 mg via INTRAVENOUS
  Filled 2023-08-14: qty 70

## 2023-08-14 MED ORDER — SODIUM CHLORIDE 0.9% FLUSH
10.0000 mL | INTRAVENOUS | Status: DC | PRN
Start: 2023-08-14 — End: 2023-08-14
  Administered 2023-08-14: 10 mL

## 2023-08-14 MED ORDER — PALONOSETRON HCL INJECTION 0.25 MG/5ML
0.2500 mg | Freq: Once | INTRAVENOUS | Status: AC
  Administered 2023-08-14: 0.25 mg via INTRAVENOUS
  Filled 2023-08-14: qty 5

## 2023-08-14 MED ORDER — MAGNESIUM SULFATE 2 GM/50ML IV SOLN
2.0000 g | Freq: Once | INTRAVENOUS | Status: AC
Start: 2023-08-14 — End: 2023-08-14
  Administered 2023-08-14: 2 g via INTRAVENOUS
  Filled 2023-08-14: qty 50

## 2023-08-14 MED ORDER — SODIUM CHLORIDE 0.9 % IV SOLN
150.0000 mg | Freq: Once | INTRAVENOUS | Status: AC
  Administered 2023-08-14: 150 mg via INTRAVENOUS
  Filled 2023-08-14: qty 150

## 2023-08-14 MED ORDER — HEPARIN SOD (PORK) LOCK FLUSH 100 UNIT/ML IV SOLN
500.0000 [IU] | Freq: Once | INTRAVENOUS | Status: AC | PRN
  Administered 2023-08-14: 500 [IU]

## 2023-08-14 MED ORDER — SODIUM CHLORIDE 0.9 % IV SOLN: INTRAVENOUS | Status: DC

## 2023-08-14 MED ORDER — OXYCODONE HCL 5 MG PO TABS
10.0000 mg | ORAL_TABLET | Freq: Once | ORAL | Status: AC
  Administered 2023-08-14: 10 mg via ORAL
  Filled 2023-08-14: qty 2

## 2023-08-14 MED ORDER — POTASSIUM CHLORIDE IN NACL 20-0.9 MEQ/L-% IV SOLN
Freq: Once | INTRAVENOUS | Status: AC
Start: 2023-08-14 — End: 2023-08-14
  Filled 2023-08-14: qty 1000

## 2023-08-14 MED ORDER — DEXAMETHASONE SODIUM PHOSPHATE 10 MG/ML IJ SOLN
10.0000 mg | Freq: Once | INTRAMUSCULAR | Status: AC
  Administered 2023-08-14: 10 mg via INTRAVENOUS
  Filled 2023-08-14: qty 1

## 2023-08-14 NOTE — Progress Notes (Signed)
 Pt had c/o 9/10 throat pain during infusion today. Pt informed this RN the last dose of PO pain meds he took was Oxycodone  10 mg at 0600 prior to appts today. This RN made Dr. Autumn aware. This RN received V/O from Dr. [name] for Oxycodone  10 mg PO to be given in infusion today.

## 2023-08-14 NOTE — Progress Notes (Addendum)
 Nutrition Follow-up:  Patient with SCC of right supra glottis and oropharynx, with nodal involvement, multifocal disease.  Patient receiving concurrent cisplatin  and radiation.  PEG (20 Fr) placed on 12/19  Met with patient today during infusion.  Patient reports that he has used about 5-6 cartons of osmolite 1.5 via tube over the past week.  Took 2 cartons on Friday, 1/3 within a few minutes and felt full.  On Saturday and Sunday gave 1 carton of osmolite 1.5 via tube with 60cc water  flush before and after feeding.  Denies nausea.  Bowels are moving.  Yesterday able to eat egg, jello, drinking 2 ensure a day, soups  Reports that feeding tube is leaking.    Medications: reviewed  Labs: reviewed  Anthropometrics:   Weight 139 lb 8 oz today 144 lb 8 oz on 12/23 141 lb on 12/19 UBW of 152 lb   Estimated Energy Needs  Kcals: 2000-2300 Protein: 78-98 g Fluid: > 2 L  NUTRITION DIAGNOSIS: Sub optimal energy intake ongoing with radiation    INTERVENTION:  Discussed spacing tube feeding out 3-4 hours apart.  Encouraged 2-3 cartons of osmolite 1.5 via tube between meal times for added nutrition Continue ensure shakes orally, 2 per day if possible.  Coupons given Discussed importance of hydration.  Nurse Navigator to assist with tightening feeding tube if needed.   MONITORING, EVALUATION, GOAL: weight trends, intake   NEXT VISIT: Monday, Jan 13 during infusion  Zamariah Seaborn B. Dasie, RD, LDN Registered Dietitian [phone]

## 2023-08-14 NOTE — Patient Instructions (Signed)
 [facility] - A DEPT OF [facility]  Discharge Instructions: Thank you for choosing [facility] to provide your oncology and hematology care.   If you have a lab appointment with the Cancer Center, please go directly to the Cancer Center and check in at the registration area.   Wear comfortable clothing and clothing appropriate for easy access to any Portacath or PICC line.   We strive to give you quality time with your provider. You may need to reschedule your appointment if you arrive late (15 or more minutes).  Arriving late affects you and other patients whose appointments are after yours.  Also, if you miss three or more appointments without notifying the office, you may be dismissed from the clinic at the provider's discretion.      For prescription refill requests, have your pharmacy contact our office and allow 72 hours for refills to be completed.    Today you received the following chemotherapy and/or immunotherapy agents: Cisplatin      To help prevent nausea and vomiting after your treatment, we encourage you to take your nausea medication as directed.  BELOW ARE SYMPTOMS THAT SHOULD BE REPORTED IMMEDIATELY: *FEVER GREATER THAN 100.4 F (38 C) OR HIGHER *CHILLS OR SWEATING *NAUSEA AND VOMITING THAT IS NOT CONTROLLED WITH YOUR NAUSEA MEDICATION *UNUSUAL SHORTNESS OF BREATH *UNUSUAL BRUISING OR BLEEDING *URINARY PROBLEMS (pain or burning when urinating, or frequent urination) *BOWEL PROBLEMS (unusual diarrhea, constipation, pain near the anus) TENDERNESS IN MOUTH AND THROAT WITH OR WITHOUT PRESENCE OF ULCERS (sore throat, sores in mouth, or a toothache) UNUSUAL RASH, SWELLING OR PAIN  UNUSUAL VAGINAL DISCHARGE OR ITCHING   Items with * indicate a potential emergency and should be followed up as soon as possible or go to the Emergency Department if any problems should occur.  Please show the CHEMOTHERAPY ALERT CARD or IMMUNOTHERAPY  ALERT CARD at check-in to the Emergency Department and triage nurse.  Should you have questions after your visit or need to cancel or reschedule your appointment, please contact [facility] - A DEPT OF [facility]  Dept: [phone]  and follow the prompts.  Office hours are 8:00 a.m. to 4:30 p.m. Monday - Friday. Please note that voicemails left after 4:00 p.m. may not be returned until the following business day.  We are closed weekends and major holidays. You have access to a nurse at all times for urgent questions. Please call the main number to the clinic Dept: [phone] and follow the prompts.   For any non-urgent questions, you may also contact your provider using MyChart. We now offer e-Visits for anyone 31 and older to request care online for non-urgent symptoms. For details visit [email or web address].   Also download the MyChart app! Go to the app store, search "MyChart", open the app, select [place], and log in with your MyChart username and password.

## 2023-08-15 ENCOUNTER — Other Ambulatory Visit: Payer: Self-pay

## 2023-08-15 ENCOUNTER — Ambulatory Visit
Admission: RE | Admit: 2023-08-15 | Discharge: 2023-08-15 | Disposition: A | Discharge status: Home / Self Care | Payer: Medicare HMO | Source: Ambulatory Visit | Attending: Radiation Oncology | Admitting: Radiation Oncology

## 2023-08-15 DIAGNOSIS — C051 Malignant neoplasm of soft palate: Secondary | ICD-10-CM | POA: Diagnosis not present

## 2023-08-15 DIAGNOSIS — C321 Malignant neoplasm of supraglottis: Secondary | ICD-10-CM | POA: Diagnosis not present

## 2023-08-15 DIAGNOSIS — C328 Malignant neoplasm of overlapping sites of larynx: Secondary | ICD-10-CM | POA: Diagnosis not present

## 2023-08-15 DIAGNOSIS — Z510 Encounter for antineoplastic radiation therapy: Secondary | ICD-10-CM | POA: Diagnosis not present

## 2023-08-15 LAB — RAD ONC ARIA SESSION SUMMARY
Course Elapsed Days: 15
Plan Fractions Treated to Date: 10
Plan Prescribed Dose Per Fraction: 2 Gy
Plan Total Fractions Prescribed: 35
Plan Total Prescribed Dose: 70 Gy
Reference Point Dosage Given to Date: 20 Gy
Reference Point Session Dosage Given: 2 Gy
Session Number: 10

## 2023-08-16 ENCOUNTER — Encounter: Payer: Self-pay | Admitting: Oncology

## 2023-08-16 ENCOUNTER — Ambulatory Visit
Admission: RE | Admit: 2023-08-16 | Discharge: 2023-08-16 | Disposition: A | Discharge status: Home / Self Care | Payer: Medicare HMO | Source: Ambulatory Visit | Attending: Radiation Oncology | Admitting: Radiation Oncology

## 2023-08-16 ENCOUNTER — Other Ambulatory Visit: Payer: Self-pay

## 2023-08-16 DIAGNOSIS — Z510 Encounter for antineoplastic radiation therapy: Secondary | ICD-10-CM | POA: Diagnosis not present

## 2023-08-16 DIAGNOSIS — C321 Malignant neoplasm of supraglottis: Secondary | ICD-10-CM | POA: Diagnosis not present

## 2023-08-16 DIAGNOSIS — C051 Malignant neoplasm of soft palate: Secondary | ICD-10-CM | POA: Diagnosis not present

## 2023-08-16 DIAGNOSIS — C328 Malignant neoplasm of overlapping sites of larynx: Secondary | ICD-10-CM | POA: Diagnosis not present

## 2023-08-16 LAB — RAD ONC ARIA SESSION SUMMARY
Course Elapsed Days: 16
Plan Fractions Treated to Date: 11
Plan Prescribed Dose Per Fraction: 2 Gy
Plan Total Fractions Prescribed: 35
Plan Total Prescribed Dose: 70 Gy
Reference Point Dosage Given to Date: 22 Gy
Reference Point Session Dosage Given: 2 Gy
Session Number: 11

## 2023-08-16 NOTE — Telephone Encounter (Signed)
Open in error

## 2023-08-17 ENCOUNTER — Ambulatory Visit
Admission: RE | Admit: 2023-08-17 | Discharge: 2023-08-17 | Disposition: A | Discharge status: Home / Self Care | Payer: Medicare HMO | Source: Ambulatory Visit | Attending: Radiation Oncology | Admitting: Radiation Oncology

## 2023-08-17 ENCOUNTER — Inpatient Hospital Stay: Payer: Medicare HMO

## 2023-08-17 ENCOUNTER — Other Ambulatory Visit: Payer: Self-pay

## 2023-08-17 ENCOUNTER — Encounter: Payer: Self-pay | Admitting: Oncology

## 2023-08-17 ENCOUNTER — Inpatient Hospital Stay (HOSPITAL_BASED_OUTPATIENT_CLINIC_OR_DEPARTMENT_OTHER): Payer: Medicare HMO | Admitting: Oncology

## 2023-08-17 VITALS — BP 129/80 | HR 90 | Temp 97.2°F | Resp 18 | Wt 143.1 lb

## 2023-08-17 DIAGNOSIS — F17218 Nicotine dependence, cigarettes, with other nicotine-induced disorders: Secondary | ICD-10-CM | POA: Diagnosis not present

## 2023-08-17 DIAGNOSIS — F418 Other specified anxiety disorders: Secondary | ICD-10-CM

## 2023-08-17 DIAGNOSIS — C321 Malignant neoplasm of supraglottis: Secondary | ICD-10-CM | POA: Diagnosis not present

## 2023-08-17 DIAGNOSIS — C3492 Malignant neoplasm of unspecified part of left bronchus or lung: Secondary | ICD-10-CM

## 2023-08-17 DIAGNOSIS — C051 Malignant neoplasm of soft palate: Secondary | ICD-10-CM

## 2023-08-17 DIAGNOSIS — Z510 Encounter for antineoplastic radiation therapy: Secondary | ICD-10-CM | POA: Diagnosis not present

## 2023-08-17 DIAGNOSIS — R232 Flushing: Secondary | ICD-10-CM

## 2023-08-17 DIAGNOSIS — C328 Malignant neoplasm of overlapping sites of larynx: Secondary | ICD-10-CM | POA: Diagnosis not present

## 2023-08-17 DIAGNOSIS — G893 Neoplasm related pain (acute) (chronic): Secondary | ICD-10-CM | POA: Diagnosis not present

## 2023-08-17 DIAGNOSIS — Z5111 Encounter for antineoplastic chemotherapy: Secondary | ICD-10-CM | POA: Diagnosis not present

## 2023-08-17 LAB — RAD ONC ARIA SESSION SUMMARY
Course Elapsed Days: 17
Plan Fractions Treated to Date: 12
Plan Prescribed Dose Per Fraction: 2 Gy
Plan Total Fractions Prescribed: 35
Plan Total Prescribed Dose: 70 Gy
Reference Point Dosage Given to Date: 24 Gy
Reference Point Session Dosage Given: 2 Gy
Session Number: 12

## 2023-08-17 LAB — CBC WITH DIFFERENTIAL (CANCER CENTER ONLY)
Abs Immature Granulocytes: 0.02 10*3/uL (ref 0.00–0.07)
Basophils Absolute: 0 10*3/uL (ref 0.0–0.1)
Basophils Relative: 0 %
Eosinophils Absolute: 0 10*3/uL (ref 0.0–0.5)
Eosinophils Relative: 1 %
HCT: 36.5 % — ABNORMAL LOW (ref 39.0–52.0)
Hemoglobin: 12.5 g/dL — ABNORMAL LOW (ref 13.0–17.0)
Immature Granulocytes: 0 %
Lymphocytes Relative: 10 %
Lymphs Abs: 0.8 10*3/uL (ref 0.7–4.0)
MCH: 32.7 pg (ref 26.0–34.0)
MCHC: 34.2 g/dL (ref 30.0–36.0)
MCV: 95.5 fL (ref 80.0–100.0)
Monocytes Absolute: 0.7 10*3/uL (ref 0.1–1.0)
Monocytes Relative: 9 %
Neutro Abs: 6.3 10*3/uL (ref 1.7–7.7)
Neutrophils Relative %: 80 %
Platelet Count: 201 10*3/uL (ref 150–400)
RBC: 3.82 MIL/uL — ABNORMAL LOW (ref 4.22–5.81)
RDW: 13.2 % (ref 11.5–15.5)
WBC Count: 7.8 10*3/uL (ref 4.0–10.5)
nRBC: 0 % (ref 0.0–0.2)

## 2023-08-17 LAB — BASIC METABOLIC PANEL - CANCER CENTER ONLY
Anion gap: 6 (ref 5–15)
BUN: 14 mg/dL (ref 8–23)
CO2: 31 mmol/L (ref 22–32)
Calcium: 9.1 mg/dL (ref 8.9–10.3)
Chloride: 96 mmol/L — ABNORMAL LOW (ref 98–111)
Creatinine: 0.51 mg/dL — ABNORMAL LOW (ref 0.61–1.24)
GFR, Estimated: >60 mL/min (ref >60)
Glucose, Bld: 116 mg/dL — ABNORMAL HIGH (ref 70–99)
Potassium: 4 mmol/L (ref 3.5–5.1)
Sodium: 133 mmol/L — ABNORMAL LOW (ref 135–145)

## 2023-08-17 LAB — MAGNESIUM: Magnesium: 2 mg/dL (ref 1.7–2.4)

## 2023-08-17 MED ORDER — ALPRAZOLAM 0.5 MG PO TABS: 0.5000 mg | ORAL_TABLET | Freq: Every evening | ORAL | 0 refills | Status: AC | PRN

## 2023-08-17 MED ORDER — HEPARIN SOD (PORK) LOCK FLUSH 100 UNIT/ML IV SOLN
500.0000 [IU] | Freq: Once | INTRAVENOUS | Status: AC
  Administered 2023-08-17: 500 [IU]

## 2023-08-17 MED ORDER — SODIUM CHLORIDE 0.9% FLUSH
10.0000 mL | Freq: Once | INTRAVENOUS | Status: AC
  Administered 2023-08-17: 10 mL

## 2023-08-17 NOTE — Assessment & Plan Note (Addendum)
 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  Noted changes in voice and difficulty swallowing solid foods. No signs of lung cancer recurrence on PET scan.   He seems to have diffuse disease throughout much of his oropharynx.  This is not appearing obviously on his PET scan.  He also has right supraglottic cancer and a hypermetabolic right level 3 lymph node consistent with lymphatic spread from his head and neck primaries.  He is less likely to be a surgical candidate given the extent of his disease.  Hence recommendation made to proceed with concurrent chemoradiation and patient and his daughter are agreeable with this plan.  -He started concurrent chemoradiation with weekly cisplatin  from 07/31/2023.  Has been tolerating treatments well without major side effects.  - Labs today reveal no dose-limiting toxicities.  Will proceed with cycle 4 of cisplatin  as planned on 08/21/2023.  Cisplatin  will be continued weekly during the course of radiation.

## 2023-08-17 NOTE — Progress Notes (Signed)
 Oncology Nurse Navigator Documentation   Bryan Fleming has been experiencing drainage from his PEG incision due to slippage of the retention ring. I have made several attempts over the past few days to tighten the retention ring but it will loosen quickly after. I spoke with Franky Rakers in IR who suggested I use hypafix tape around the PEG against the retention ring to keep it from slipping frequently. I met Bryan Fleming today and applied the tape above the retention ring as suggested. I have asked him to call or come see me if that doesn't seem to be working and I would have him seen by IR. He voiced his understanding.  Delon Jefferson RN, BSN, OCN Head & Neck Oncology Nurse Navigator [facility] Phone # [phone]  Fax # [phone]

## 2023-08-17 NOTE — Progress Notes (Addendum)
 [facility]  ONCOLOGY CLINIC PROGRESS NOTE   Patient Care Team: [name], Benita Area, MD as PCP - General (Internal Medicine) Delford Maude BROCKS, MD as PCP - Cardiology (Cardiology) Celestia Joesph SQUIBB, RN as Oncology Nurse Navigator (Oncology) Rogers Hai, MD as Medical Oncologist (Medical Oncology) Malmfelt, Delon CROME, RN as Oncology Nurse Navigator Izell Domino, MD as Consulting Physician (Radiation Oncology) Autumn Millman, MD as Consulting Physician (Oncology) Llewellyn Gerard LABOR, DO as Consulting Physician (Otolaryngology) Izell Domino, MD as Attending Physician (Radiation Oncology)  PATIENT NAME: Bryan Fleming   MR#: [id number] DOB: [birth year]  Date of visit: 08/17/2023   ASSESSMENT & PLAN:   Bryan Fleming is a 63 y.o.  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   Cancer of soft palate (HCC) Recent diagnosis of squamous cell carcinoma in the supraglottis, oropharynx, multifocal disease.  Noted changes in voice and difficulty swallowing solid foods. No signs of lung cancer recurrence on PET scan.   He seems to have diffuse disease throughout much of his oropharynx.  This is not appearing obviously on his PET scan.  He also has right supraglottic cancer and a hypermetabolic right level 3 lymph node consistent with lymphatic spread from his head and neck primaries.  He is less likely to be a surgical candidate given the extent of his disease.  Hence recommendation made to proceed with concurrent chemoradiation and patient and his daughter are agreeable with this  plan.  -He started concurrent chemoradiation with weekly cisplatin  from 07/31/2023.  Has been tolerating treatments well without major side effects.  - Labs today reveal no dose-limiting toxicities.  Will proceed with cycle 4 of cisplatin  as planned on 08/21/2023.  Cisplatin  will be continued weekly during the course of radiation.   Cancer associated pain - He is currently on oxycodone  as needed with adequate pain control.  He was educated on judicial use.  Proposed fentanyl  patch for better pain control but patient would like to think about this and let us  know.  Situational anxiety -Patient reported insomnia from recent developments and has been experiencing fatigue from it.    -Per patient request, I gave him a trial of Xanax  to be used at bedtime as needed and explained to him that we will not continue this after he completes concurrent chemoradiation.  Primary lung squamous cell carcinoma, left (HCC) - Please see HPI above for details and past treatments at [facility], under the direction of Dr. Rogers.  -Remains in clinical remission.  Recent PET/CT showed no evidence of disease recurrence as far as lung cancer is concerned.  Will continue surveillance for now.  Nicotine  dependence, cigarettes, with other nicotine -induced disorders Reports smoking a pack a day.   -Previously prescribed nicotine  patches but these were not covered by his insurance.  We will prescribe him Wellbutrin  or Chantix, once he completes chemoradiation treatments.  We do encourage smoking cessation for overall health and cancer prevention.  Unintentional weight loss He is starting to develop radiation and chemo related side effects which is making it it hard for him to eat by mouth.  He will have to rely mostly on tube feeding to provide the majority  of patient's nutritional needs at this time.  He is working with our nutritionist regarding this.   I reviewed lab results and outside records for  this visit and discussed relevant results with the patient. Diagnosis, plan of care and treatment options were also discussed in detail with the patient. Opportunity provided to ask questions and answers provided to his apparent satisfaction. Provided instructions to call our clinic with any problems, questions or concerns prior to return visit. I recommended to continue follow-up with PCP and sub-specialists. He verbalized understanding and agreed with the plan.   NCCN guidelines have been consulted in the planning of this patient's care.  I spent a total of 30 minutes during this encounter with the patient including review of chart and various tests results, discussions about plan of care and coordination of care plan.   Chinita Patten, MD  08/17/2023 2:06 PM  [facility] - A DEPT OF [facility] [address] [place] [address] Dept: [phone] Dept Fax: [phone]    CHIEF COMPLAINT/ REASON FOR VISIT:   Moderate to poorly differentiated squamous cell carcinoma of the right supraglottis and oropharynx, with nodal involvement, multifocal disease   Current Treatment: Concurrent chemoradiation with weekly cisplatin  to start from 07/31/2023.  INTERVAL HISTORY:    Discussed the use of AI scribe software for clinical note transcription with the patient, who gave verbal consent to proceed.   Bryan Fleming is here today for repeat clinical assessment.    He reports some issues with his feeding tube, which was temporarily fixed with tape. He denies nausea and vomiting. His appetite varies, and he supplements his diet with tube feedings. He continues to maintain his weight and still consumes some food orally. He also reports persistent tingling and numbness, which started after his previous chemotherapy for his lung cancer. He has been experiencing pain, particularly in his throat, which is partially controlled with  oxycodone  taken four times a day. He expresses concern about running out of medication if he increases the frequency of doses. He also reports difficulty sleeping and has run out of his Xanax  prescription.  I have reviewed the past medical history, past surgical history, social history and family history with the patient and they are unchanged from previous note.  HISTORY OF PRESENT ILLNESS:   I have reviewed his chart and materials related to his cancer extensively and collaborated history with the patient. Summary of oncologic history is as follows:   Patient was being followed by Dr. Katragadda at [facility] for history of squamous cell carcinoma of left upper lobe of lung, diagnosed in May 2022.  He underwent neoadjuvant chemotherapy with 3 cycles of carboplatin , paclitaxel  and nivolumab  from 01/13/2021 through 02/24/2021 based on [id number]. This was followed by left upper lobectomy and lymph node resection on 04/21/2021. Pathology consistent with invasive moderately differentiated squamous cell carcinoma, 5 cm, margins negative.  Visceral pleura not involved.  Metastatic squamous cell carcinoma in 1/2 hilar lymph nodes.  Levels 5, 11, 12, 10, 11 and 12 lymph nodes were negative.  No definitive tumor response identified.  No lymphovascular invasion.  ypT2b, ypN1. Caris testing on resection sample from 04/21/2021 shows PD-L1 TPS 1%.  No targetable mutations.  MSI stable.  TMB low.    On routine restaging CT of the chest on 09/16/2022, there was no evidence of recurrent or metastatic disease in the chest.  However there was pancreatic ductal dilatation  and hence CT abdomen was recommended.   On 11/02/2022 CT abdomen with and without contrast showed 1.4 x 1.1 cm focus of hypoenhancement/ hypoattenuation in the region of the ampulla.  No discrete mass identified.  This was associated with dilation of the main pancreatic duct.  MRI/MRCP was recommended.  MRCP on 01/09/2023 showed similar  findings.   Patient was referred to Dr. Wilhelmenia, GI for further evaluation/ERCP.  Patient canceled few appointments and eventually established with Dr. Wilhelmenia on 05/15/2023.  ERCP/EUS was obtained on 05/15/2023.  EGD showed obvious mass lesion noted at vallecular fold.  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  Congested major papula.  EUS showed pancreatic parenchymal abnormalities consisting of hyperechoic foci, lobularity and hyperechoic strands in the pancreatic head, genu of pancreas and pancreatic body and tail.  There was no sign of significant pathology in the CBD and in the common hepatic duct.  No malignant appearing lymph nodes.   On 06/19/2023, CT soft tissue of the neck showed 2.4 cm right supraglottic mass consistent with malignancy.  Pathologic right level 3 lymph node consistent with metastatic disease.  Asymmetric soft tissue in the left lateral upper oropharynx, indeterminate for second mucosal malignancy.  New 11 mm subsolid pulmonary nodule in the right upper lobe with additional smaller peribronchovascular groundglass densities elsewhere in the right upper lobe.  Most likely infectious or inflammatory, however attention recommended on PET/CT to exclude primary lung malignancy or metastatic disease.   Patient was referred to ENT Dr. Llewellyn for further evaluation of incidentally found throat mass on EGD. Exam demonstrated 2 distinct exophytic lesions, one in the supraglottis and 1 in the oropharynx, both of which were concerning for primary head and neck cancer. Patient had a palpable lymph node in the right neck concerning for metastatic spread. He was taken to OR for microlaryngoscopy and biopsy on 07/04/2023.  Operative findings included exophytic mucosal mass involving the right arytenoid, aryepiglottic fold and postcricoid area.  Extension to the vallecula noted but no obvious mucosal abnormality of the epiglottis, false cords or true cords.  From, abnormal mucosal  changes of the soft palate extending from the right anterior/posterior pillars to the left soft palate.  Uvula absent.  Firm palpable nodularity of the right tonsil.  Pathology from postcricoid, right arytenoid, right aryepiglottic, left soft palate, right tonsil all came back positive for invasive squamous cell carcinoma, moderately to poorly differentiated.  p16 negative.   On 07/13/2023, staging PET/CT showed hypermetabolic right sided supraglottic mass consistent with the patient's known primary malignancy.  Hypermetabolic adjacent right level 3 lymph node consistent with metastatic disease.  No findings for distant metastatic disease involving the chest, abdomen or pelvis.  Stable surgical changes from left upper lobe lobectomy.  No findings for residual or recurrent lung cancer.  Started concurrent chemoradiation with weekly cisplatin  on 07/31/2023.  Oncology History  Primary lung squamous cell carcinoma, left (HCC)  12/31/2020 Cancer Staging   Staging form: Lung, AJCC 8th Edition - Clinical stage from 12/31/2020: Stage IIIA (cT2b, cN2, cM0) - Signed by Rogers Hai, MD on 01/01/2021 Stage prefix: Initial diagnosis   01/01/2021 Initial Diagnosis   Primary lung squamous cell carcinoma, left (HCC)   01/13/2021 - 02/24/2021 Chemotherapy   Patient is on Treatment Plan : LUNG NSCLC Carboplatin  / Paclitaxel  q21d x 3 cycles     Cancer of soft palate (HCC)  07/19/2023 Initial Diagnosis   Cancer of soft palate (HCC)   07/19/2023 Cancer Staging   Staging form: Pharynx - P16 Negative  Oropharynx, AJCC 8th Edition - Clinical stage from 07/19/2023: Stage III (cT3, cN1, cM0, p16-) - Signed by Izell Domino, MD on 07/19/2023 Stage prefix: Initial diagnosis   07/31/2023 -  Chemotherapy   Patient is on Treatment Plan : HEAD/NECK Cisplatin  (40) q7d     Malignant neoplasm of supraglottis (HCC)  07/19/2023 Initial Diagnosis   Malignant neoplasm of supraglottis (HCC)   07/31/2023 -  Chemotherapy    Patient is on Treatment Plan : HEAD/NECK Cisplatin  (40) q7d         REVIEW OF SYSTEMS:   Review of Systems - Oncology  All other pertinent systems were reviewed with the patient and are negative.  ALLERGIES: He has no known allergies.  MEDICATIONS:  Current Outpatient Medications  Medication Sig Dispense Refill   acetaminophen  (TYLENOL ) 500 MG tablet Take 2 tablets (1,000 mg total) by mouth every 6 (six) hours. 45 tablet 0   amLODipine  (NORVASC ) 5 MG tablet Take 5 mg by mouth in the morning.     dexamethasone  (DECADRON ) 4 MG tablet Take 2 tablets (8 mg) by mouth daily x 3 days starting the day after cisplatin  chemotherapy. Take with food. 30 tablet 1   Fluticasone -Umeclidin-Vilant (TRELEGY ELLIPTA) 100-62.5-25 MCG/INH AEPB Inhale 1 puff into the lungs in the morning.     lidocaine -prilocaine  (EMLA ) cream Apply to affected area once 30 g 3   metoprolol  tartrate (LOPRESSOR ) 50 MG tablet Take 50 mg by mouth 2 (two) times daily.     ondansetron  (ZOFRAN ) 8 MG tablet Take 1 tablet (8 mg total) by mouth every 8 (eight) hours as needed for nausea or vomiting. Start on the third day after cisplatin . 30 tablet 1   Oxycodone  HCl 10 MG TABS Take 1 tablet (10 mg total) by mouth every 4 (four) hours as needed. 100 tablet 0   potassium chloride  SA (KLOR-CON  M) 20 MEQ tablet Take 1 tablet (20 mEq total) by mouth 2 (two) times daily. 60 tablet 1   prochlorperazine  (COMPAZINE ) 10 MG tablet Take 1 tablet (10 mg total) by mouth every 6 (six) hours as needed (Nausea or vomiting). 30 tablet 1   albuterol  (VENTOLIN  HFA) 108 (90 Base) MCG/ACT inhaler Inhale 1-2 puffs into the lungs every 6 (six) hours as needed for wheezing or shortness of breath. (Patient not taking: Reported on 08/17/2023)     ALPRAZolam  (XANAX ) 0.5 MG tablet Take 1 tablet (0.5 mg total) by mouth at bedtime as needed for anxiety. 30 tablet 0   lidocaine  (XYLOCAINE ) 2 % solution Patient: Mix 1part 2% viscous lidocaine , 1part H20. Swish &  swallow 10mL of diluted mixture, before meals and at bedtime, up to QID (Patient not taking: Reported on 08/17/2023) 200 mL 3   loratadine (CLARITIN) 10 MG tablet Take 10 mg by mouth daily. (Patient not taking: Reported on 08/17/2023)     Multiple Vitamin (MULTIVITAMIN WITH MINERALS) TABS tablet Take 1 tablet by mouth in the morning. (Patient not taking: Reported on 08/11/2023)     nicotine  (NICODERM CQ ) 14 mg/24hr patch Place 1 patch (14 mg total) onto the skin daily. (Patient not taking: Reported on 08/11/2023) 28 patch 1   omeprazole  (PRILOSEC) 40 MG capsule Take 1 capsule (40 mg total) by mouth 2 (two) times daily. (Patient not taking: Reported on 08/17/2023) 60 capsule 12   sodium fluoride  (FLUORISHIELD) 1.1 % GEL dental gel Place 1 Application onto teeth at bedtime. (Patient not taking: Reported on 08/17/2023)     No current facility-administered medications for this visit.  VITALS:   Blood pressure 129/80, pulse 90, temperature (!) 97.2 F (36.2 C), temperature source Temporal, resp. rate 18, weight 143 lb 1.6 oz (64.9 kg), SpO2 98%.  Wt Readings from Last 3 Encounters:  08/17/23 143 lb 1.6 oz (64.9 kg)  08/14/23 139 lb 8 oz (63.3 kg)  08/11/23 140 lb 3.2 oz (63.6 kg)    Body mass index is 21.13 kg/m.  Performance status (ECOG): 1 - Symptomatic but completely ambulatory  PHYSICAL EXAM:   Physical Exam Constitutional:      General: He is not in acute distress.    Appearance: Normal appearance.  HENT:     Head: Normocephalic and atraumatic.  Eyes:     General: No scleral icterus.    Conjunctiva/sclera: Conjunctivae normal.  Cardiovascular:     Rate and Rhythm: Normal rate and regular rhythm.     Heart sounds: Normal heart sounds.  Pulmonary:     Effort: Pulmonary effort is normal.     Breath sounds: Wheezing (scattered, bilateral) present.  Chest:     Comments: Right-sided Port-A-Cath in place, without any signs of infection Abdominal:     General: There is no  distension.     Comments: Feeding tube in place  Musculoskeletal:     Right lower leg: No edema.     Left lower leg: No edema.  Neurological:     General: No focal deficit present.     Mental Status: He is alert and oriented to person, place, and time.  Psychiatric:        Mood and Affect: Mood normal.        Behavior: Behavior normal.        Thought Content: Thought content normal.       LABORATORY DATA:   I have reviewed the data as listed.  Results for orders placed or performed in visit on 08/17/23  Rad Onc Aria Session Summary  Result Value Ref Range   Course ID [id number]    Course Start Date 07/21/2023    Session Number 12    Course First Treatment Date 07/31/2023  2:33 PM    Course Last Treatment Date 08/17/2023 11:49 AM    Course Elapsed Days 17    Reference Point ID HN_Throat dp    Reference Point Dosage Given to Date 24 Gy   Reference Point Session Dosage Given 2 Gy   Plan ID HN_Throat    Plan Name HN_Throat    Plan Fractions Treated to Date 12    Plan Total Fractions Prescribed 35    Plan Prescribed Dose Per Fraction 2 Gy   Plan Total Prescribed Dose 70.000000 Gy   Plan Primary Reference Point HN_Throat dp   Results for orders placed or performed in visit on 08/17/23  Magnesium   Result Value Ref Range   Magnesium  2.0 1.7 - 2.4 mg/dL  Basic Metabolic Panel - Cancer Center Only  Result Value Ref Range   Sodium 133 (L) 135 - 145 mmol/L   Potassium 4.0 3.5 - 5.1 mmol/L   Chloride 96 (L) 98 - 111 mmol/L   CO2 31 22 - 32 mmol/L   Glucose, Bld 116 (H) 70 - 99 mg/dL   BUN 14 8 - 23 mg/dL   Creatinine 9.48 (L) 9.38 - 1.24 mg/dL   Calcium 9.1 8.9 - 89.6 mg/dL   GFR, Estimated >39 >39 mL/min   Anion gap 6 5 - 15  CBC with Differential (Cancer Center Only)  Result Value Ref Range   WBC  Count 7.8 4.0 - 10.5 K/uL   RBC 3.82 (L) 4.22 - 5.81 MIL/uL   Hemoglobin 12.5 (L) 13.0 - 17.0 g/dL   HCT 63.4 (L) 60.9 - 47.9 %   MCV 95.5 80.0 - 100.0 fL   MCH 32.7 26.0 - 34.0  pg   MCHC 34.2 30.0 - 36.0 g/dL   RDW 86.7 88.4 - 84.4 %   Platelet Count 201 150 - 400 K/uL   nRBC 0.0 0.0 - 0.2 %   Neutrophils Relative % 80 %   Neutro Abs 6.3 1.7 - 7.7 K/uL   Lymphocytes Relative 10 %   Lymphs Abs 0.8 0.7 - 4.0 K/uL   Monocytes Relative 9 %   Monocytes Absolute 0.7 0.1 - 1.0 K/uL   Eosinophils Relative 1 %   Eosinophils Absolute 0.0 0.0 - 0.5 K/uL   Basophils Relative 0 %   Basophils Absolute 0.0 0.0 - 0.1 K/uL   Immature Granulocytes 0 %   Abs Immature Granulocytes 0.02 0.00 - 0.07 K/uL     RADIOGRAPHIC STUDIES:  I have personally reviewed the radiological images as listed and agree with the findings in the report.  IR Gastrostomy Tube Result Date: 07/27/2023 INDICATION: 63 year old male with history of head neck cancer requiring percutaneous gastrostomy access for nutritional purposes. EXAM: 1. Fluoroscopic guided orogastric tube placement. 2. PERC PLACEMENT GASTROSTOMY MEDICATIONS: Ancef  1 gm IV; Antibiotics were administered within 1 hour of the procedure. ANESTHESIA/SEDATION: Moderate (conscious) sedation was employed during this procedure. A total of Versed  1 mg and Fentanyl  50 mcg was administered intravenously. Moderate Sedation Time: 10 minutes. The patient's level of consciousness and vital signs were monitored continuously by radiology nursing throughout the procedure under my direct supervision. CONTRAST:  Seven-administered into the gastric lumen. FLUOROSCOPY TIME:  Eleven mGy COMPLICATIONS: None immediate. PROCEDURE: Informed written consent was obtained from the patient after a thorough discussion of the procedural risks, benefits and alternatives. All questions were addressed. Maximal Sterile barrier Technique was utilized including caps, mask, sterile gowns, sterile gloves, sterile drape, hand hygiene and skin antiseptic. A timeout was performed prior to the initiation of the procedure. The patient was placed on the procedure table in the supine  position. Pre-procedure abdominal film confirmed visualization of the transverse colon. An angled 5-French catheter was passed through the nares into the stomach. The patient was prepped and draped in usual sterile fashion. The stomach was insufflated with air via the indwelling nasogastric tube. Under fluoroscopy, a puncture site was selected and local analgesia achieved with 1% lidocaine  infiltrated subcutaneously. Under fluoroscopic guidance, a gastropexy needle was passed into the stomach and the T-bar suture was released. Entry into the stomach was confirmed with fluoroscopy, aspiration of air, and injection of contrast material. This was repeated with an additional gastropexy suture (for a total of 2 fasteners). At the center of these gastropexy sutures, a dermatotomy was performed. An 18 gauge needle was passed into the stomach at the site of this dermatotomy, and position within the gastric lumen again confirmed under fluoroscopy using aspiration of air and contrast injection. An Amplatz guidewire was passed through this needle and intraluminal placement within the stomach was confirmed by fluoroscopy. The needle was removed. Over the guidewire, the percutaneous tract was dilated using a 10 mm non-compliant balloon. The balloon was deflated, then pushed into the gastric lumen followed in concert by the 20 Fr gastrostomy tube. The retention balloon of the percutaneous gastrostomy tube was inflated with 10 mL of sterile water . The tube was  withdrawn until the retention balloon was at the edge of the gastric lumen. The external bumper was brought to the abdominal wall. Contrast was injected through the gastrostomy tube, confirming intraluminal positioning. The patient tolerated the procedure well without any immediate post-procedural complications. IMPRESSION: Technically successful placement of 20 Fr gastrostomy tube. PLAN: Return in 6 months for routine gastrostomy tube exchange. Ester Sides, MD [facility] Radiology Electronically Signed   By: Ester Sides M.D.   On: 07/27/2023 21:08    CODE STATUS:  Code Status History     Date Active Date Inactive Code Status Order ID Comments User Context   07/27/2023 1558 07/28/2023 0516 Full Code [id number]  Sides Ester PARAS, MD [facility]   12/01/2022 2127 12/06/2022 1709 Full Code [id number]  Elgergawy, Brayton RAMAN, MD ED   10/13/2022 1155 10/14/2022 1706 Full Code [id number]  Sherrilee Belvie CROME, MD Inpatient   02/17/2022 1635 02/18/2022 1856 Full Code [id number]  Sherrilee Belvie CROME, MD Inpatient   04/21/2021 2124 04/23/2021 2111 Full Code [id number]  Barrett, Rocky JONELLE RIGGERS Inpatient   06/04/2020 1253 06/06/2020 1807 Full Code [id number]  Vicci Afton CROME, MD ED    Questions for Most Recent Historical Code Status (Order [id number])     Question Answer   By: Consent: discussion documented in EHR            No orders of the defined types were placed in this encounter.    Future Appointments  Date Time Provider Department Center  08/18/2023  9:00 AM [facility] [id number] [facility] None  08/21/2023  8:00 AM [facility] INFUSION [facility] None  08/21/2023  9:00 AM Dasie Simple, RD [facility] None  08/21/2023  3:15 PM [facility] [id number] [facility] None  08/22/2023  3:15 PM [facility] [id number] [facility] None  08/23/2023  3:15 PM [facility] LINAC 3 [facility] None  08/24/2023  3:15 PM [facility] [id number] [facility] None  08/25/2023 10:45 AM [facility] FLUSH [facility] None  08/25/2023 11:20 AM Candice Lunney, MD [facility] None  08/25/2023  1:30 PM [facility] LINAC 4 [facility] None  08/28/2023  7:30 AM [facility] INFUSION [facility] None  08/28/2023  9:00 AM Dasie Simple, RD [facility] None  08/28/2023  3:15 PM [facility] [id number] [facility] None  08/29/2023  3:15 PM [facility] [id number] [facility] None  08/30/2023  3:15 PM [facility] [id number] [facility] None  08/31/2023  3:15 PM [facility]  [id number] [facility] None  09/01/2023 10:00 AM [facility] [id number] [facility] None  09/01/2023 11:00 AM [facility] FLUSH [facility] None  09/01/2023 11:20 AM Jailyn Leeson, MD [facility] None  09/04/2023  7:30 AM [facility] INFUSION [facility] None  09/04/2023  9:00 AM Dasie Simple, RD [facility] None  09/04/2023  3:15 PM [facility] [id number] [facility] None  09/05/2023  3:15 PM [facility] [id number] [facility] None  09/06/2023  3:15 PM [facility] [id number] [facility] None  09/07/2023  3:15 PM [facility] [id number] [facility] None  09/08/2023 10:45 AM [facility] [id number] [facility] None  09/08/2023 11:00 AM [facility] FLUSH [facility] None  09/08/2023 11:20 AM Shylo Zamor, MD [facility] None  09/11/2023  7:30 AM [facility] INFUSION [facility] None  09/11/2023  9:00 AM Dasie Simple, RD [facility] None  09/11/2023  3:15 PM [facility] [id number] [facility] None  09/12/2023  3:15 PM [facility] [id number] [facility] None  09/13/2023  3:15 PM [facility] [id number] [facility] None  09/14/2023  1:00 PM AP-ACAPA NURSE [facility] None  09/14/2023  2:00 PM AP-CT 1 [facility]-CT [place] [facility]  09/14/2023  3:15 PM [facility] [id number] [facility] None  09/15/2023  3:15 PM [facility] [id number] [facility] None  09/18/2023  3:15 PM [facility] [id number] [facility] None  09/19/2023  3:15 PM [facility] [id number] [facility] None  09/21/2023 11:00 AM Fayette Lamar GAILS, CCC-SLP [facility] None  09/21/2023  3:00 PM Rogers Hai, MD [facility] None  10/10/2023 12:00 PM Lanis Carbon, Florina CROME, PT [facility] None      This document was completed utilizing speech recognition software. Grammatical errors, random word insertions, pronoun errors, and incomplete sentences are an occasional consequence of this system due to software limitations, ambient noise, and hardware issues. Any formal questions or concerns about the content, text or information contained within the body of this dictation should be directly  addressed to the provider for clarification.

## 2023-08-17 NOTE — Assessment & Plan Note (Addendum)
-  Patient reported insomnia from recent developments and has been experiencing fatigue from it.    -Per patient request, I gave him a trial of Xanax to be used at bedtime as needed and explained to him that we will not continue this after he completes concurrent chemoradiation.

## 2023-08-17 NOTE — Assessment & Plan Note (Addendum)
 -  He is currently on oxycodone as needed with adequate pain control.  He was educated on judicial use.  Proposed fentanyl patch for better pain control but patient would like to think about this and let us know.

## 2023-08-17 NOTE — Assessment & Plan Note (Signed)
 Reports smoking a pack a day.   -Previously prescribed nicotine patches but these were not covered by his insurance.  We will prescribe him Wellbutrin or Chantix, once he completes chemoradiation treatments.  We do encourage smoking cessation for overall health and cancer prevention.

## 2023-08-17 NOTE — Assessment & Plan Note (Signed)
-   Please see HPI above for details and past treatments at [facility], under the direction of Dr. Ellin Saba.  -Remains in clinical remission.  Recent PET/CT showed no evidence of disease recurrence as far as lung cancer is concerned.  Will continue surveillance for now.

## 2023-08-18 ENCOUNTER — Ambulatory Visit
Admission: RE | Admit: 2023-08-18 | Discharge: 2023-08-18 | Disposition: A | Discharge status: Home / Self Care | Payer: Medicare HMO | Source: Ambulatory Visit | Attending: Radiation Oncology

## 2023-08-18 ENCOUNTER — Other Ambulatory Visit: Payer: Self-pay

## 2023-08-18 DIAGNOSIS — C328 Malignant neoplasm of overlapping sites of larynx: Secondary | ICD-10-CM | POA: Diagnosis not present

## 2023-08-18 DIAGNOSIS — C051 Malignant neoplasm of soft palate: Secondary | ICD-10-CM | POA: Diagnosis not present

## 2023-08-18 DIAGNOSIS — C321 Malignant neoplasm of supraglottis: Secondary | ICD-10-CM | POA: Diagnosis not present

## 2023-08-18 DIAGNOSIS — Z510 Encounter for antineoplastic radiation therapy: Secondary | ICD-10-CM | POA: Diagnosis not present

## 2023-08-18 LAB — RAD ONC ARIA SESSION SUMMARY
Course Elapsed Days: 18
Plan Fractions Treated to Date: 13
Plan Prescribed Dose Per Fraction: 2 Gy
Plan Total Fractions Prescribed: 35
Plan Total Prescribed Dose: 70 Gy
Reference Point Dosage Given to Date: 26 Gy
Reference Point Session Dosage Given: 2 Gy
Session Number: 13

## 2023-08-18 MED FILL — Fosaprepitant Dimeglumine For IV Infusion 150 MG (Base Eq): INTRAVENOUS | Qty: 5 | Status: AC

## 2023-08-21 ENCOUNTER — Other Ambulatory Visit: Payer: Self-pay | Admitting: Oncology

## 2023-08-21 ENCOUNTER — Ambulatory Visit
Admission: RE | Admit: 2023-08-21 | Discharge: 2023-08-21 | Disposition: A | Discharge status: Home / Self Care | Payer: Medicare HMO | Source: Ambulatory Visit | Attending: Radiation Oncology | Admitting: Radiation Oncology

## 2023-08-21 ENCOUNTER — Inpatient Hospital Stay: Payer: Medicare HMO

## 2023-08-21 ENCOUNTER — Other Ambulatory Visit: Payer: Self-pay

## 2023-08-21 ENCOUNTER — Other Ambulatory Visit: Payer: Self-pay | Admitting: Radiation Oncology

## 2023-08-21 ENCOUNTER — Ambulatory Visit: Payer: Medicare HMO

## 2023-08-21 VITALS — BP 107/75 | HR 92 | Temp 97.7°F | Resp 16 | Wt 137.5 lb

## 2023-08-21 DIAGNOSIS — C051 Malignant neoplasm of soft palate: Secondary | ICD-10-CM | POA: Diagnosis not present

## 2023-08-21 DIAGNOSIS — C321 Malignant neoplasm of supraglottis: Secondary | ICD-10-CM

## 2023-08-21 DIAGNOSIS — R232 Flushing: Secondary | ICD-10-CM

## 2023-08-21 DIAGNOSIS — C328 Malignant neoplasm of overlapping sites of larynx: Secondary | ICD-10-CM | POA: Diagnosis not present

## 2023-08-21 DIAGNOSIS — Z510 Encounter for antineoplastic radiation therapy: Secondary | ICD-10-CM | POA: Diagnosis not present

## 2023-08-21 DIAGNOSIS — Z5111 Encounter for antineoplastic chemotherapy: Secondary | ICD-10-CM | POA: Diagnosis not present

## 2023-08-21 LAB — RAD ONC ARIA SESSION SUMMARY
Course Elapsed Days: 21
Plan Fractions Treated to Date: 14
Plan Prescribed Dose Per Fraction: 2 Gy
Plan Total Fractions Prescribed: 35
Plan Total Prescribed Dose: 70 Gy
Reference Point Dosage Given to Date: 28 Gy
Reference Point Session Dosage Given: 2 Gy
Session Number: 14

## 2023-08-21 MED ORDER — OSMOLITE 1.5 CAL PO LIQD: ORAL | Status: AC

## 2023-08-21 MED ORDER — SODIUM CHLORIDE 0.9 % IV SOLN
150.0000 mg | Freq: Once | INTRAVENOUS | Status: AC
  Administered 2023-08-21: 150 mg via INTRAVENOUS
  Filled 2023-08-21: qty 150

## 2023-08-21 MED ORDER — SODIUM CHLORIDE 0.9 % IV SOLN: INTRAVENOUS | Status: DC

## 2023-08-21 MED ORDER — DEXAMETHASONE SODIUM PHOSPHATE 10 MG/ML IJ SOLN
10.0000 mg | Freq: Once | INTRAMUSCULAR | Status: AC
  Administered 2023-08-21: 10 mg via INTRAVENOUS
  Filled 2023-08-21: qty 1

## 2023-08-21 MED ORDER — SODIUM CHLORIDE 0.9% FLUSH
10.0000 mL | INTRAVENOUS | Status: DC | PRN
  Administered 2023-08-21: 10 mL

## 2023-08-21 MED ORDER — OXYCODONE HCL 5 MG PO TABS
10.0000 mg | ORAL_TABLET | Freq: Once | ORAL | Status: AC
  Administered 2023-08-21: 10 mg via ORAL
  Filled 2023-08-21: qty 2

## 2023-08-21 MED ORDER — MAGNESIUM SULFATE 2 GM/50ML IV SOLN
2.0000 g | Freq: Once | INTRAVENOUS | Status: AC
  Administered 2023-08-21: 2 g via INTRAVENOUS
  Filled 2023-08-21: qty 50

## 2023-08-21 MED ORDER — FLUCONAZOLE 100 MG PO TABS: ORAL_TABLET | ORAL | 0 refills | Status: DC

## 2023-08-21 MED ORDER — POTASSIUM CHLORIDE IN NACL 20-0.9 MEQ/L-% IV SOLN
Freq: Once | INTRAVENOUS | Status: AC
  Filled 2023-08-21: qty 1000

## 2023-08-21 MED ORDER — CISPLATIN CHEMO INJECTION 100MG/100ML
40.0000 mg/m2 | Freq: Once | INTRAVENOUS | Status: AC
  Administered 2023-08-21: 70 mg via INTRAVENOUS
  Filled 2023-08-21: qty 70

## 2023-08-21 MED ORDER — HEPARIN SOD (PORK) LOCK FLUSH 100 UNIT/ML IV SOLN
500.0000 [IU] | Freq: Once | INTRAVENOUS | Status: AC | PRN
Start: 2023-08-21 — End: 2023-08-21
  Administered 2023-08-21: 500 [IU]

## 2023-08-21 MED ORDER — PALONOSETRON HCL INJECTION 0.25 MG/5ML
0.2500 mg | Freq: Once | INTRAVENOUS | Status: AC
  Administered 2023-08-21: 0.25 mg via INTRAVENOUS
  Filled 2023-08-21: qty 5

## 2023-08-21 NOTE — Addendum Note (Signed)
 Addended by: Alphonse Guild B on: 08/21/2023 11:04 AM   Modules accepted: Orders

## 2023-08-21 NOTE — Patient Instructions (Signed)
 [facility] - A DEPT OF [facility]  Discharge Instructions: Thank you for choosing [facility] to provide your oncology and hematology care.   If you have a lab appointment with the Cancer Center, please go directly to the Cancer Center and check in at the registration area.   Wear comfortable clothing and clothing appropriate for easy access to any Portacath or PICC line.   We strive to give you quality time with your provider. You may need to reschedule your appointment if you arrive late (15 or more minutes).  Arriving late affects you and other patients whose appointments are after yours.  Also, if you miss three or more appointments without notifying the office, you may be dismissed from the clinic at the provider's discretion.      For prescription refill requests, have your pharmacy contact our office and allow 72 hours for refills to be completed.    Today you received the following chemotherapy and/or immunotherapy agents: Cisplatin      To help prevent nausea and vomiting after your treatment, we encourage you to take your nausea medication as directed.  BELOW ARE SYMPTOMS THAT SHOULD BE REPORTED IMMEDIATELY: *FEVER GREATER THAN 100.4 F (38 C) OR HIGHER *CHILLS OR SWEATING *NAUSEA AND VOMITING THAT IS NOT CONTROLLED WITH YOUR NAUSEA MEDICATION *UNUSUAL SHORTNESS OF BREATH *UNUSUAL BRUISING OR BLEEDING *URINARY PROBLEMS (pain or burning when urinating, or frequent urination) *BOWEL PROBLEMS (unusual diarrhea, constipation, pain near the anus) TENDERNESS IN MOUTH AND THROAT WITH OR WITHOUT PRESENCE OF ULCERS (sore throat, sores in mouth, or a toothache) UNUSUAL RASH, SWELLING OR PAIN  UNUSUAL VAGINAL DISCHARGE OR ITCHING   Items with * indicate a potential emergency and should be followed up as soon as possible or go to the Emergency Department if any problems should occur.  Please show the CHEMOTHERAPY ALERT CARD or IMMUNOTHERAPY  ALERT CARD at check-in to the Emergency Department and triage nurse.  Should you have questions after your visit or need to cancel or reschedule your appointment, please contact [facility] - A DEPT OF [facility]  Dept: [phone]  and follow the prompts.  Office hours are 8:00 a.m. to 4:30 p.m. Monday - Friday. Please note that voicemails left after 4:00 p.m. may not be returned until the following business day.  We are closed weekends and major holidays. You have access to a nurse at all times for urgent questions. Please call the main number to the clinic Dept: [phone] and follow the prompts.   For any non-urgent questions, you may also contact your provider using MyChart. We now offer e-Visits for anyone 31 and older to request care online for non-urgent symptoms. For details visit [email or web address].   Also download the MyChart app! Go to the app store, search "MyChart", open the app, select [place], and log in with your MyChart username and password.

## 2023-08-21 NOTE — Progress Notes (Addendum)
 Nutrition Follow-up:  Patient with SCC of oropharynx (soft palate, supraglottis with right lymph noode involvement.  Patient receiving concurrent cisplatin  and radiation. PEG tube in place.  Met with patient during infusion.  Reports that he is giving 2 cartons of osmolite 1.5 via feeding tube a day.  Reports had a episode of vomiting on Saturday.  Took nausea pill after and it helped him.  Reports yesterday just eating yogurt orally and drank 2 ensure shakes.  Current oral intake not meeting nutritional needs due to painful swallowing, no taste, and ongoing side effects from chemotherapy and radiation treatment.    Anticipate long term need of feeding tube.  Medications: ondansetron , prochlorperazine   Labs: Na 133, glucose 116, creatinine 0.51  Anthropometrics:   Weight 137 lb 8 oz today  139 lb 8 oz on 1/6 144 lb 8 oz on 12/23 141 lb on 12/19  UBW of 152 lb  5% weight loss in the last 3 weeks, concerning  Estimated Energy Needs  Kcals: 2000-2300 Protein: 78-98 g Fluid: 2000-2300 ml  NUTRITION DIAGNOSIS: Sub optimal energy intake ongoing with radiation   INTERVENTION:  Recommend osmolite 1.5, 6 cartons per day via feeding tube.  Flush with 60ml of water  before and after each feeding. Needs additional 3 cups of fluids orally or via tube.  Meets 100% of needs. Provides 2130 calories, 89.4 g protein and 2280 ml free water .  Recommend patient to increase tube feeding to 1 carton 4 times a day at this time.  Will continue titration to goal rate pending tolerance.  Written instructions regarding tube feeding regimen given to patient and discussed.  Patient verbalized understanding. Spoke with representative from [employer] regarding enteral nutrition referral for supplies.      MONITORING, EVALUATION, GOAL: weight trends, intake, tube feeding   NEXT VISIT: Monday, Jan 20 during infusion  Shanigua Gibb B. Dasie, RD, LDN Registered Dietitian [phone]

## 2023-08-22 ENCOUNTER — Ambulatory Visit
Admission: RE | Admit: 2023-08-22 | Discharge: 2023-08-22 | Disposition: A | Discharge status: Home / Self Care | Payer: Medicare HMO | Source: Ambulatory Visit | Attending: Radiation Oncology | Admitting: Radiation Oncology

## 2023-08-22 ENCOUNTER — Other Ambulatory Visit: Payer: Self-pay

## 2023-08-22 DIAGNOSIS — C051 Malignant neoplasm of soft palate: Secondary | ICD-10-CM | POA: Diagnosis not present

## 2023-08-22 DIAGNOSIS — C328 Malignant neoplasm of overlapping sites of larynx: Secondary | ICD-10-CM | POA: Diagnosis not present

## 2023-08-22 DIAGNOSIS — C321 Malignant neoplasm of supraglottis: Secondary | ICD-10-CM | POA: Diagnosis not present

## 2023-08-22 DIAGNOSIS — Z510 Encounter for antineoplastic radiation therapy: Secondary | ICD-10-CM | POA: Diagnosis not present

## 2023-08-22 LAB — RAD ONC ARIA SESSION SUMMARY
Course Elapsed Days: 22
Plan Fractions Treated to Date: 15
Plan Prescribed Dose Per Fraction: 2 Gy
Plan Total Fractions Prescribed: 35
Plan Total Prescribed Dose: 70 Gy
Reference Point Dosage Given to Date: 30 Gy
Reference Point Session Dosage Given: 2 Gy
Session Number: 15

## 2023-08-23 ENCOUNTER — Ambulatory Visit
Admission: RE | Admit: 2023-08-23 | Discharge: 2023-08-23 | Disposition: A | Discharge status: Home / Self Care | Payer: Medicare HMO | Source: Ambulatory Visit | Attending: Radiation Oncology

## 2023-08-23 ENCOUNTER — Other Ambulatory Visit: Payer: Self-pay

## 2023-08-23 DIAGNOSIS — C051 Malignant neoplasm of soft palate: Secondary | ICD-10-CM | POA: Diagnosis not present

## 2023-08-23 DIAGNOSIS — C328 Malignant neoplasm of overlapping sites of larynx: Secondary | ICD-10-CM | POA: Diagnosis not present

## 2023-08-23 DIAGNOSIS — C321 Malignant neoplasm of supraglottis: Secondary | ICD-10-CM | POA: Diagnosis not present

## 2023-08-23 DIAGNOSIS — Z510 Encounter for antineoplastic radiation therapy: Secondary | ICD-10-CM | POA: Diagnosis not present

## 2023-08-23 LAB — RAD ONC ARIA SESSION SUMMARY
Course Elapsed Days: 23
Plan Fractions Treated to Date: 16
Plan Prescribed Dose Per Fraction: 2 Gy
Plan Total Fractions Prescribed: 35
Plan Total Prescribed Dose: 70 Gy
Reference Point Dosage Given to Date: 32 Gy
Reference Point Session Dosage Given: 2 Gy
Session Number: 16

## 2023-08-24 ENCOUNTER — Other Ambulatory Visit: Payer: Self-pay

## 2023-08-24 ENCOUNTER — Ambulatory Visit
Admission: RE | Admit: 2023-08-24 | Discharge: 2023-08-24 | Disposition: A | Discharge status: Home / Self Care | Payer: Medicare HMO | Source: Ambulatory Visit | Attending: Radiation Oncology | Admitting: Radiation Oncology

## 2023-08-24 DIAGNOSIS — C051 Malignant neoplasm of soft palate: Secondary | ICD-10-CM | POA: Diagnosis not present

## 2023-08-24 DIAGNOSIS — C321 Malignant neoplasm of supraglottis: Secondary | ICD-10-CM | POA: Diagnosis not present

## 2023-08-24 DIAGNOSIS — C328 Malignant neoplasm of overlapping sites of larynx: Secondary | ICD-10-CM | POA: Diagnosis not present

## 2023-08-24 DIAGNOSIS — Z510 Encounter for antineoplastic radiation therapy: Secondary | ICD-10-CM | POA: Diagnosis not present

## 2023-08-24 LAB — RAD ONC ARIA SESSION SUMMARY
Course Elapsed Days: 24
Plan Fractions Treated to Date: 17
Plan Prescribed Dose Per Fraction: 2 Gy
Plan Total Fractions Prescribed: 35
Plan Total Prescribed Dose: 70 Gy
Reference Point Dosage Given to Date: 34 Gy
Reference Point Session Dosage Given: 2 Gy
Session Number: 17

## 2023-08-25 ENCOUNTER — Inpatient Hospital Stay: Payer: Medicare HMO

## 2023-08-25 ENCOUNTER — Inpatient Hospital Stay (HOSPITAL_BASED_OUTPATIENT_CLINIC_OR_DEPARTMENT_OTHER): Payer: Medicare HMO | Admitting: Oncology

## 2023-08-25 ENCOUNTER — Ambulatory Visit
Admission: RE | Admit: 2023-08-25 | Discharge: 2023-08-25 | Disposition: A | Discharge status: Home / Self Care | Payer: Medicare HMO | Source: Ambulatory Visit | Attending: Radiation Oncology

## 2023-08-25 ENCOUNTER — Encounter: Payer: Self-pay | Admitting: Oncology

## 2023-08-25 ENCOUNTER — Other Ambulatory Visit: Payer: Self-pay

## 2023-08-25 VITALS — BP 97/65 | HR 90 | Temp 98.2°F | Resp 16 | Wt 140.5 lb

## 2023-08-25 DIAGNOSIS — R232 Flushing: Secondary | ICD-10-CM

## 2023-08-25 DIAGNOSIS — F17218 Nicotine dependence, cigarettes, with other nicotine-induced disorders: Secondary | ICD-10-CM

## 2023-08-25 DIAGNOSIS — C321 Malignant neoplasm of supraglottis: Secondary | ICD-10-CM

## 2023-08-25 DIAGNOSIS — C328 Malignant neoplasm of overlapping sites of larynx: Secondary | ICD-10-CM | POA: Diagnosis not present

## 2023-08-25 DIAGNOSIS — F418 Other specified anxiety disorders: Secondary | ICD-10-CM

## 2023-08-25 DIAGNOSIS — Z5111 Encounter for antineoplastic chemotherapy: Secondary | ICD-10-CM | POA: Diagnosis not present

## 2023-08-25 DIAGNOSIS — Z510 Encounter for antineoplastic radiation therapy: Secondary | ICD-10-CM | POA: Diagnosis not present

## 2023-08-25 DIAGNOSIS — C3492 Malignant neoplasm of unspecified part of left bronchus or lung: Secondary | ICD-10-CM | POA: Diagnosis not present

## 2023-08-25 DIAGNOSIS — C051 Malignant neoplasm of soft palate: Secondary | ICD-10-CM | POA: Diagnosis not present

## 2023-08-25 DIAGNOSIS — G893 Neoplasm related pain (acute) (chronic): Secondary | ICD-10-CM

## 2023-08-25 LAB — MAGNESIUM: Magnesium: 1.8 mg/dL (ref 1.7–2.4)

## 2023-08-25 LAB — CBC WITH DIFFERENTIAL (CANCER CENTER ONLY)
Abs Immature Granulocytes: 0.01 10*3/uL (ref 0.00–0.07)
Basophils Absolute: 0 10*3/uL (ref 0.0–0.1)
Basophils Relative: 0 %
Eosinophils Absolute: 0.1 10*3/uL (ref 0.0–0.5)
Eosinophils Relative: 1 %
HCT: 34.9 % — ABNORMAL LOW (ref 39.0–52.0)
Hemoglobin: 11.9 g/dL — ABNORMAL LOW (ref 13.0–17.0)
Immature Granulocytes: 0 %
Lymphocytes Relative: 16 %
Lymphs Abs: 0.8 10*3/uL (ref 0.7–4.0)
MCH: 32.5 pg (ref 26.0–34.0)
MCHC: 34.1 g/dL (ref 30.0–36.0)
MCV: 95.4 fL (ref 80.0–100.0)
Monocytes Absolute: 0.4 10*3/uL (ref 0.1–1.0)
Monocytes Relative: 8 %
Neutro Abs: 3.6 10*3/uL (ref 1.7–7.7)
Neutrophils Relative %: 75 %
Platelet Count: 137 10*3/uL — ABNORMAL LOW (ref 150–400)
RBC: 3.66 MIL/uL — ABNORMAL LOW (ref 4.22–5.81)
RDW: 13.2 % (ref 11.5–15.5)
WBC Count: 4.9 10*3/uL (ref 4.0–10.5)
nRBC: 0 % (ref 0.0–0.2)

## 2023-08-25 LAB — RAD ONC ARIA SESSION SUMMARY
Course Elapsed Days: 25
Plan Fractions Treated to Date: 18
Plan Prescribed Dose Per Fraction: 2 Gy
Plan Total Fractions Prescribed: 35
Plan Total Prescribed Dose: 70 Gy
Reference Point Dosage Given to Date: 36 Gy
Reference Point Session Dosage Given: 2 Gy
Session Number: 18

## 2023-08-25 LAB — BASIC METABOLIC PANEL - CANCER CENTER ONLY
Anion gap: 7 (ref 5–15)
BUN: 10 mg/dL (ref 8–23)
CO2: 30 mmol/L (ref 22–32)
Calcium: 9.4 mg/dL (ref 8.9–10.3)
Chloride: 96 mmol/L — ABNORMAL LOW (ref 98–111)
Creatinine: 0.54 mg/dL — ABNORMAL LOW (ref 0.61–1.24)
GFR, Estimated: >60 mL/min (ref >60)
Glucose, Bld: 103 mg/dL — ABNORMAL HIGH (ref 70–99)
Potassium: 4.2 mmol/L (ref 3.5–5.1)
Sodium: 133 mmol/L — ABNORMAL LOW (ref 135–145)

## 2023-08-25 MED ORDER — HEPARIN SOD (PORK) LOCK FLUSH 100 UNIT/ML IV SOLN
500.0000 [IU] | Freq: Once | INTRAVENOUS | Status: AC
  Administered 2023-08-25: 500 [IU]

## 2023-08-25 MED ORDER — SODIUM CHLORIDE 0.9% FLUSH
10.0000 mL | Freq: Once | INTRAVENOUS | Status: AC
  Administered 2023-08-25: 10 mL

## 2023-08-25 NOTE — Assessment & Plan Note (Signed)
-   He is currently on oxycodone as needed with adequate pain control.  He was educated on judicial use.  Proposed fentanyl patch for better pain control but patient would like to think about this and let us know.

## 2023-08-25 NOTE — Assessment & Plan Note (Signed)
-   Please see HPI above for details and past treatments at [facility], under the direction of Dr. Ellin Saba.  -Remains in clinical remission.  Recent PET/CT showed no evidence of disease recurrence as far as lung cancer is concerned.  Will continue surveillance for now.

## 2023-08-25 NOTE — Progress Notes (Signed)
[facility]  ONCOLOGY CLINIC PROGRESS NOTE   Patient Care Team: [name], Wayland Salinas, MD as PCP - General (Internal Medicine) Wendall Stade, MD as PCP - Cardiology (Cardiology) Therese Sarah, RN as Oncology Nurse Navigator (Oncology) Doreatha Massed, MD as Medical Oncologist (Medical Oncology) Malmfelt, Lise Auer, RN as Oncology Nurse Navigator Lonie Peak, MD as Consulting Physician (Radiation Oncology) [name], MD as Consulting Physician (Oncology) Laren Boom, DO as Consulting Physician (Otolaryngology) Lonie Peak, MD as Attending Physician (Radiation Oncology)  PATIENT NAME: Bryan Fleming   MR#: [id number] DOB: 04-23-1961  Date of visit: 08/25/2023   ASSESSMENT & PLAN:   [name] is a 63 y.o.  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   Cancer of soft palate (HCC) Recent diagnosis of squamous cell carcinoma in the supraglottis, oropharynx, multifocal disease.  Noted changes in voice and difficulty swallowing solid foods. No signs of lung cancer recurrence on PET scan.   He seems to have diffuse disease throughout much of his oropharynx.  This is not appearing obviously on his PET scan.  He also has right supraglottic cancer and a hypermetabolic right level 3 lymph node consistent with lymphatic spread from his head and neck primaries.  He is less likely to be a surgical candidate given the extent of his disease.  Hence recommendation made to proceed with concurrent chemoradiation and patient and his daughter were agreeable with  this plan.  -He started concurrent chemoradiation with weekly cisplatin from 07/31/2023.  Has been tolerating treatments well without major side effects.  - Labs today reveal no dose-limiting toxicities.  Will proceed with cycle 5 of cisplatin as planned on 08/28/2023.  Cisplatin will be continued weekly during the course of radiation.   Cancer associated pain - He is currently on oxycodone as needed with adequate pain control.  He was educated on judicial use.  Proposed fentanyl patch for better pain control but patient would like to think about this and let us know.  Situational anxiety -Patient reported insomnia from recent developments and has been experiencing fatigue from it.    -Per patient request, I gave him a trial of Xanax to be used at bedtime as needed and explained to him that we will not continue this after he completes concurrent chemoradiation.  Nicotine dependence, cigarettes, with other nicotine-induced disorders Reports smoking a pack a day.   -Previously prescribed nicotine patches but these were not covered by his insurance.  We will prescribe him Wellbutrin or Chantix, once he completes chemoradiation treatments.  We do encourage smoking cessation for overall health and cancer prevention.  Primary lung squamous cell carcinoma, left (HCC) - Please see HPI above for details and past treatments at [facility], under the direction of Dr. Ellin Saba.  -Remains in clinical remission.  Recent PET/CT showed no evidence of disease recurrence as far as lung cancer is concerned.  Will continue surveillance for now.  RTC in 1 week for labs, follow-up and continuation of chemotherapy.   I reviewed lab results and outside records for this visit and discussed relevant results with the patient. Diagnosis, plan of care and treatment options were also discussed  in detail with the patient. Opportunity provided to ask questions and answers provided to his apparent  satisfaction. Provided instructions to call our clinic with any problems, questions or concerns prior to return visit. I recommended to continue follow-up with PCP and sub-specialists. He verbalized understanding and agreed with the plan.   NCCN guidelines have been consulted in the planning of this patient's care.  I spent a total of 30 minutes during this encounter with the patient including review of chart and various tests results, discussions about plan of care and coordination of care plan.   [name], MD  08/25/2023 11:46 AM  [facility] - A DEPT OF [facility] [address] [place] [address] Dept: [phone] Dept Fax: [phone]    CHIEF COMPLAINT/ REASON FOR VISIT:   Moderate to poorly differentiated squamous cell carcinoma of the right supraglottis and oropharynx, with nodal involvement, multifocal disease   Current Treatment: Concurrent chemoradiation with weekly cisplatin to start from 07/31/2023.  INTERVAL HISTORY:    Discussed the use of AI scribe software for clinical note transcription with the patient, who gave verbal consent to proceed.   [name] is here today for repeat clinical assessment.    He reports increased soreness, which he is managing with mouthwashes. Despite the discomfort, he is able to eat and maintain hydration. He has been diligent in flushing his feeding tube. He also reports occasional feelings of nausea, but no vomiting. Bowel movements are regular. The patient has been working with a nutritionist and his weight has been fluctuating around 140 lbs. He also mentions that he has enough pain medication, but will need to check his supply.  I have reviewed the past medical history, past surgical history, social history and family history with the patient and they are unchanged from previous note.  HISTORY OF PRESENT ILLNESS:   I have reviewed his chart and  materials related to his cancer extensively and collaborated history with the patient. Summary of oncologic history is as follows:   Patient was being followed by Dr. Ellin Saba at [facility] for history of squamous cell carcinoma of left upper lobe of lung, diagnosed in May 2022.  He underwent neoadjuvant chemotherapy with 3 cycles of carboplatin, paclitaxel and nivolumab from 01/13/2021 through 02/24/2021 based on [id number]. This was followed by left upper lobectomy and lymph node resection on 04/21/2021. Pathology consistent with invasive moderately differentiated squamous cell carcinoma, 5 cm, margins negative.  Visceral pleura not involved.  Metastatic squamous cell carcinoma in 1/2 hilar lymph nodes.  Levels 5, 11, 12, 10, 11 and 12 lymph nodes were negative.  No definitive tumor response identified.  No lymphovascular invasion.  ypT2b, ypN1. Caris testing on resection sample from 04/21/2021 shows PD-L1 TPS 1%.  No targetable mutations.  MSI stable.  TMB low.    On routine restaging CT of the chest on 09/16/2022, there was no evidence of recurrent or metastatic disease in the chest.  However there was pancreatic ductal dilatation and hence CT abdomen was recommended.   On 11/02/2022 CT abdomen with and without contrast showed 1.4 x 1.1 cm focus of hypoenhancement/ hypoattenuation in the region of the ampulla.  No discrete mass identified.  This was associated with dilation of the main pancreatic duct.  MRI/MRCP was recommended.  MRCP on 01/09/2023 showed similar findings.   Patient was referred to Dr. Meridee Score, GI for further evaluation/ERCP.  Patient canceled few appointments  and eventually established with Dr. Meridee Score on 05/15/2023.  ERCP/EUS was obtained on 05/15/2023.  EGD showed obvious mass lesion noted at vallecular fold.  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  Congested major papula.  EUS showed pancreatic parenchymal abnormalities consisting of hyperechoic foci,  lobularity and hyperechoic strands in the pancreatic head, genu of pancreas and pancreatic body and tail.  There was no sign of significant pathology in the CBD and in the common hepatic duct.  No malignant appearing lymph nodes.   On 06/19/2023, CT soft tissue of the neck showed 2.4 cm right supraglottic mass consistent with malignancy.  Pathologic right level 3 lymph node consistent with metastatic disease.  Asymmetric soft tissue in the left lateral upper oropharynx, indeterminate for second mucosal malignancy.  New 11 mm subsolid pulmonary nodule in the right upper lobe with additional smaller peribronchovascular groundglass densities elsewhere in the right upper lobe.  Most likely infectious or inflammatory, however attention recommended on PET/CT to exclude primary lung malignancy or metastatic disease.   Patient was referred to ENT Dr. Marene Lenz for further evaluation of incidentally found throat mass on EGD. Exam demonstrated 2 distinct exophytic lesions, one in the supraglottis and 1 in the oropharynx, both of which were concerning for primary head and neck cancer. Patient had a palpable lymph node in the right neck concerning for metastatic spread. He was taken to OR for microlaryngoscopy and biopsy on 07/04/2023.  Operative findings included exophytic mucosal mass involving the right arytenoid, aryepiglottic fold and postcricoid area.  Extension to the vallecula noted but no obvious mucosal abnormality of the epiglottis, false cords or true cords.  From, abnormal mucosal changes of the soft palate extending from the right anterior/posterior pillars to the left soft palate.  Uvula absent.  Firm palpable nodularity of the right tonsil.  Pathology from postcricoid, right arytenoid, right aryepiglottic, left soft palate, right tonsil all came back positive for invasive squamous cell carcinoma, moderately to poorly differentiated.  p16 negative.   On 07/13/2023, staging PET/CT showed hypermetabolic right  sided supraglottic mass consistent with the patient's known primary malignancy.  Hypermetabolic adjacent right level 3 lymph node consistent with metastatic disease.  No findings for distant metastatic disease involving the chest, abdomen or pelvis.  Stable surgical changes from left upper lobe lobectomy.  No findings for residual or recurrent lung cancer.  Started concurrent chemoradiation with weekly cisplatin on 07/31/2023.  Oncology History  Primary lung squamous cell carcinoma, left (HCC)  12/31/2020 Cancer Staging   Staging form: Lung, AJCC 8th Edition - Clinical stage from 12/31/2020: Stage IIIA (cT2b, cN2, cM0) - Signed by Doreatha Massed, MD on 01/01/2021 Stage prefix: Initial diagnosis   01/01/2021 Initial Diagnosis   Primary lung squamous cell carcinoma, left (HCC)   01/13/2021 - 02/24/2021 Chemotherapy   Patient is on Treatment Plan : LUNG NSCLC Carboplatin / Paclitaxel q21d x 3 cycles     Cancer of soft palate (HCC)  07/19/2023 Initial Diagnosis   Cancer of soft palate (HCC)   07/19/2023 Cancer Staging   Staging form: Pharynx - P16 Negative Oropharynx, AJCC 8th Edition - Clinical stage from 07/19/2023: Stage III (cT3, cN1, cM0, p16-) - Signed by Lonie Peak, MD on 07/19/2023 Stage prefix: Initial diagnosis   07/31/2023 -  Chemotherapy   Patient is on Treatment Plan : HEAD/NECK Cisplatin (40) q7d     Malignant neoplasm of supraglottis (HCC)  07/19/2023 Initial Diagnosis   Malignant neoplasm of supraglottis (HCC)   07/31/2023 -  Chemotherapy   Patient is  on Treatment Plan : HEAD/NECK Cisplatin (40) q7d         REVIEW OF SYSTEMS:   Review of Systems - Oncology  All other pertinent systems were reviewed with the patient and are negative.  ALLERGIES: He has no known allergies.  MEDICATIONS:  Current Outpatient Medications  Medication Sig Dispense Refill   lidocaine (XYLOCAINE) 2 % solution Patient: Mix 1part 2% viscous lidocaine, 1part H20. Swish & swallow  10mL of diluted mixture, before meals and at bedtime, up to QID 200 mL 3   sodium fluoride (FLUORISHIELD) 1.1 % GEL dental gel Place 1 Application onto teeth at bedtime.     acetaminophen (TYLENOL) 500 MG tablet Take 2 tablets (1,000 mg total) by mouth every 6 (six) hours. 45 tablet 0   albuterol (VENTOLIN HFA) 108 (90 Base) MCG/ACT inhaler Inhale 1-2 puffs into the lungs every 6 (six) hours as needed for wheezing or shortness of breath. (Patient not taking: Reported on 08/17/2023)     ALPRAZolam (XANAX) 0.5 MG tablet Take 1 tablet (0.5 mg total) by mouth at bedtime as needed for anxiety. 30 tablet 0   amLODipine (NORVASC) 5 MG tablet Take 5 mg by mouth in the morning.     dexamethasone (DECADRON) 4 MG tablet Take 2 tablets (8 mg) by mouth daily x 3 days starting the day after cisplatin chemotherapy. Take with food. 30 tablet 1   fluconazole (DIFLUCAN) 100 MG tablet Take 2 tablets today, then 1 tablet daily x 20 more days. 22 tablet 0   Fluticasone-Umeclidin-Vilant (TRELEGY ELLIPTA) 100-62.5-25 MCG/INH AEPB Inhale 1 puff into the lungs in the morning.     lidocaine-prilocaine (EMLA) cream Apply to affected area once (Patient not taking: Reported on 08/25/2023) 30 g 3   loratadine (CLARITIN) 10 MG tablet Take 10 mg by mouth daily. (Patient not taking: Reported on 08/25/2023)     metoprolol tartrate (LOPRESSOR) 50 MG tablet Take 50 mg by mouth 2 (two) times daily.     Multiple Vitamin (MULTIVITAMIN WITH MINERALS) TABS tablet Take 1 tablet by mouth in the morning. (Patient not taking: Reported on 08/11/2023)     nicotine (NICODERM CQ) 14 mg/24hr patch Place 1 patch (14 mg total) onto the skin daily. (Patient not taking: Reported on 08/25/2023) 28 patch 1   Nutritional Supplements (FEEDING SUPPLEMENT, OSMOLITE 1.5 CAL,) LIQD Give 2 cartons at 8am and noon via feeding tube.  Give 1 carton at 4pm and 8pm.  Flush with 60ml of water before and after each feeding.  Give additional 3 cups of water via tube for  hydration if unable to drink orally.     omeprazole (PRILOSEC) 40 MG capsule Take 1 capsule (40 mg total) by mouth 2 (two) times daily. (Patient not taking: Reported on 08/11/2023) 60 capsule 12   ondansetron (ZOFRAN) 8 MG tablet Take 1 tablet (8 mg total) by mouth every 8 (eight) hours as needed for nausea or vomiting. Start on the third day after cisplatin. 30 tablet 1   Oxycodone HCl 10 MG TABS Take 1 tablet (10 mg total) by mouth every 4 (four) hours as needed. 100 tablet 0   potassium chloride SA (KLOR-CON M) 20 MEQ tablet Take 1 tablet (20 mEq total) by mouth 2 (two) times daily. 60 tablet 1   prochlorperazine (COMPAZINE) 10 MG tablet Take 1 tablet (10 mg total) by mouth every 6 (six) hours as needed (Nausea or vomiting). 30 tablet 1   No current facility-administered medications for this visit.  VITALS:   Blood pressure 97/65, pulse 90, temperature 98.2 F (36.8 C), temperature source Temporal, resp. rate 16, weight 140 lb 8 oz (63.7 kg), SpO2 99%.  Wt Readings from Last 3 Encounters:  08/25/23 140 lb 8 oz (63.7 kg)  08/21/23 137 lb 8 oz (62.4 kg)  08/17/23 143 lb 1.6 oz (64.9 kg)    Body mass index is 20.75 kg/m.  Performance status (ECOG): 1 - Symptomatic but completely ambulatory  PHYSICAL EXAM:   Physical Exam Constitutional:      General: He is not in acute distress.    Appearance: Normal appearance.  HENT:     Head: Normocephalic and atraumatic.  Eyes:     General: No scleral icterus.    Conjunctiva/sclera: Conjunctivae normal.  Cardiovascular:     Rate and Rhythm: Normal rate and regular rhythm.     Heart sounds: Normal heart sounds.  Pulmonary:     Effort: Pulmonary effort is normal.     Breath sounds: Wheezing (scattered, bilateral) present.  Chest:     Comments: Right-sided Port-A-Cath in place, without any signs of infection Abdominal:     General: There is no distension.     Comments: Feeding tube in place  Musculoskeletal:     Right lower leg: No  edema.     Left lower leg: No edema.  Neurological:     General: No focal deficit present.     Mental Status: He is alert and oriented to person, place, and time.  Psychiatric:        Mood and Affect: Mood normal.        Behavior: Behavior normal.        Thought Content: Thought content normal.       LABORATORY DATA:   I have reviewed the data as listed.  Results for orders placed or performed in visit on 08/25/23  Rad Onc Aria Session Summary  Result Value Ref Range   Course ID [id number]    Course Start Date 07/21/2023    Session Number 18    Course First Treatment Date 07/31/2023  2:33 PM    Course Last Treatment Date 08/25/2023 11:04 AM    Course Elapsed Days 25    Reference Point ID HN_Throat dp    Reference Point Dosage Given to Date 36 Gy   Reference Point Session Dosage Given 2 Gy   Plan ID HN_Throat    Plan Name HN_Throat    Plan Fractions Treated to Date 18    Plan Total Fractions Prescribed 35    Plan Prescribed Dose Per Fraction 2 Gy   Plan Total Prescribed Dose 70.000000 Gy   Plan Primary Reference Point HN_Throat dp   Results for orders placed or performed in visit on 08/25/23  CBC with Differential (Cancer Center Only)  Result Value Ref Range   WBC Count 4.9 4.0 - 10.5 K/uL   RBC 3.66 (L) 4.22 - 5.81 MIL/uL   Hemoglobin 11.9 (L) 13.0 - 17.0 g/dL   HCT 40.9 (L) 81.1 - 91.4 %   MCV 95.4 80.0 - 100.0 fL   MCH 32.5 26.0 - 34.0 pg   MCHC 34.1 30.0 - 36.0 g/dL   RDW 78.2 95.6 - 21.3 %   Platelet Count 137 (L) 150 - 400 K/uL   nRBC 0.0 0.0 - 0.2 %   Neutrophils Relative % 75 %   Neutro Abs 3.6 1.7 - 7.7 K/uL   Lymphocytes Relative 16 %   Lymphs Abs 0.8 0.7 - 4.0  K/uL   Monocytes Relative 8 %   Monocytes Absolute 0.4 0.1 - 1.0 K/uL   Eosinophils Relative 1 %   Eosinophils Absolute 0.1 0.0 - 0.5 K/uL   Basophils Relative 0 %   Basophils Absolute 0.0 0.0 - 0.1 K/uL   Immature Granulocytes 0 %   Abs Immature Granulocytes 0.01 0.00 - 0.07 K/uL      RADIOGRAPHIC STUDIES:  I have personally reviewed the radiological images as listed and agree with the findings in the report.  IR Gastrostomy Tube Result Date: 07/27/2023 INDICATION: 63 year old male with history of head neck cancer requiring percutaneous gastrostomy access for nutritional purposes. EXAM: 1. Fluoroscopic guided orogastric tube placement. 2. PERC PLACEMENT GASTROSTOMY MEDICATIONS: Ancef 1 gm IV; Antibiotics were administered within 1 hour of the procedure. ANESTHESIA/SEDATION: Moderate (conscious) sedation was employed during this procedure. A total of Versed 1 mg and Fentanyl 50 mcg was administered intravenously. Moderate Sedation Time: 10 minutes. The patient's level of consciousness and vital signs were monitored continuously by radiology nursing throughout the procedure under my direct supervision. CONTRAST:  Seven-administered into the gastric lumen. FLUOROSCOPY TIME:  Eleven mGy COMPLICATIONS: None immediate. PROCEDURE: Informed written consent was obtained from the patient after a thorough discussion of the procedural risks, benefits and alternatives. All questions were addressed. Maximal Sterile barrier Technique was utilized including caps, mask, sterile gowns, sterile gloves, sterile drape, hand hygiene and skin antiseptic. A timeout was performed prior to the initiation of the procedure. The patient was placed on the procedure table in the supine position. Pre-procedure abdominal film confirmed visualization of the transverse colon. An angled 5-French catheter was passed through the nares into the stomach. The patient was prepped and draped in usual sterile fashion. The stomach was insufflated with air via the indwelling nasogastric tube. Under fluoroscopy, a puncture site was selected and local analgesia achieved with 1% lidocaine infiltrated subcutaneously. Under fluoroscopic guidance, a gastropexy needle was passed into the stomach and the T-bar suture was released.  Entry into the stomach was confirmed with fluoroscopy, aspiration of air, and injection of contrast material. This was repeated with an additional gastropexy suture (for a total of 2 fasteners). At the center of these gastropexy sutures, a dermatotomy was performed. An 18 gauge needle was passed into the stomach at the site of this dermatotomy, and position within the gastric lumen again confirmed under fluoroscopy using aspiration of air and contrast injection. An Amplatz guidewire was passed through this needle and intraluminal placement within the stomach was confirmed by fluoroscopy. The needle was removed. Over the guidewire, the percutaneous tract was dilated using a 10 mm non-compliant balloon. The balloon was deflated, then pushed into the gastric lumen followed in concert by the 20 Fr gastrostomy tube. The retention balloon of the percutaneous gastrostomy tube was inflated with 10 mL of sterile water. The tube was withdrawn until the retention balloon was at the edge of the gastric lumen. The external bumper was brought to the abdominal wall. Contrast was injected through the gastrostomy tube, confirming intraluminal positioning. The patient tolerated the procedure well without any immediate post-procedural complications. IMPRESSION: Technically successful placement of 20 Fr gastrostomy tube. PLAN: Return in 6 months for routine gastrostomy tube exchange. Marliss Coots, MD [facility] Radiology Electronically Signed   By: Marliss Coots M.D.   On: 07/27/2023 21:08    CODE STATUS:  Code Status History     Date Active Date Inactive Code Status Order ID Comments User Context  07/27/2023 1558 07/28/2023 0516 Full Code [id number]  Bennie Dallas, MD [facility]   12/01/2022 2127 12/06/2022 1709 Full Code [id number]  Elgergawy, Leana Roe, MD ED   10/13/2022 1155 10/14/2022 1706 Full Code [id number]  [name], MD Inpatient   02/17/2022 1635 02/18/2022 1856 Full  Code [id number]  [name], MD Inpatient   04/21/2021 2124 04/23/2021 2111 Full Code [id number]  Zada Girt Inpatient   06/04/2020 1253 06/06/2020 1807 Full Code [id number]  Cleora Fleet, MD ED    Questions for Most Recent Historical Code Status (Order [id number])     Question Answer   By: Consent: discussion documented in EHR            No orders of the defined types were placed in this encounter.    Future Appointments  Date Time Provider Department Center  08/28/2023  7:30 AM [facility] INFUSION [facility] None  08/28/2023 11:15 AM Alphonse Guild, RD [facility] None  08/28/2023  3:15 PM [facility] [id number] [facility] None  08/29/2023  3:15 PM [facility] [id number] [facility] None  08/29/2023  3:30 PM LINAC-SQUIRE [facility] None  08/30/2023  3:15 PM [facility] [id number] [facility] None  08/31/2023  3:15 PM [facility] [id number] [facility] None  09/01/2023 10:00 AM [facility] [id number] [facility] None  09/01/2023 11:00 AM [facility] FLUSH [facility] None  09/01/2023 11:20 AM Kaleigh Spiegelman, MD [facility] None  09/04/2023  7:30 AM [facility] INFUSION [facility] None  09/04/2023  9:00 AM Alphonse Guild, RD [facility] None  09/04/2023  3:15 PM [facility] [id number] [facility] None  09/05/2023  3:15 PM [facility] [id number] [facility] None  09/06/2023  3:15 PM [facility] [id number] [facility] None  09/07/2023  3:15 PM [facility] [id number] [facility] None  09/08/2023 10:45 AM [facility] [id number] [facility] None  09/08/2023 11:00 AM [facility] FLUSH [facility] None  09/08/2023 11:20 AM Fabion Gatson, MD [facility] None  09/11/2023  7:30 AM [facility] INFUSION [facility] None  09/11/2023  9:00 AM Alphonse Guild, RD [facility] None  09/11/2023  3:15 PM [facility] [id number] [facility] None  09/12/2023  3:15 PM [facility] [id number] [facility] None  09/13/2023  3:15 PM [facility] [id number] [facility] None  09/14/2023  1:00 PM [facility] NURSE [facility] None   09/14/2023  2:00 PM AP-CT 1 AP-CT [place] [facility]  09/14/2023  3:15 PM [facility] [id number] [facility] None  09/15/2023  3:15 PM [facility] [id number] [facility] None  09/18/2023  3:15 PM [facility] [id number] [facility] None  09/19/2023  3:15 PM [facility] [id number] [facility] None  09/21/2023 11:00 AM Dorene Ar, CCC-SLP [facility] None  09/21/2023  3:00 PM Doreatha Massed, MD [facility] None  10/10/2023 12:00 PM Breedlove Blue, Remi Deter, PT [facility] None      This document was completed utilizing speech recognition software. Grammatical errors, random word insertions, pronoun errors, and incomplete sentences are an occasional consequence of this system due to software limitations, ambient noise, and hardware issues. Any formal questions or concerns about the content, text or information contained within the body of this dictation should be directly addressed to the provider for clarification.

## 2023-08-25 NOTE — Assessment & Plan Note (Signed)
-  Patient reported insomnia from recent developments and has been experiencing fatigue from it.    -Per patient request, I gave him a trial of Xanax to be used at bedtime as needed and explained to him that we will not continue this after he completes concurrent chemoradiation.

## 2023-08-25 NOTE — Assessment & Plan Note (Signed)
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  Noted changes in voice and difficulty swallowing solid foods. No signs of lung cancer recurrence on PET scan.   He seems to have diffuse disease throughout much of his oropharynx.  This is not appearing obviously on his PET scan.  He also has right supraglottic cancer and a hypermetabolic right level 3 lymph node consistent with lymphatic spread from his head and neck primaries.  He is less likely to be a surgical candidate given the extent of his disease.  Hence recommendation made to proceed with concurrent chemoradiation and patient and his daughter were agreeable with this plan.  -He started concurrent chemoradiation with weekly cisplatin from 07/31/2023.  Has been tolerating treatments well without major side effects.  - Labs today reveal no dose-limiting toxicities.  Will proceed with cycle 5 of cisplatin as planned on 08/28/2023.  Cisplatin will be continued weekly during the course of radiation.

## 2023-08-25 NOTE — Assessment & Plan Note (Signed)
 Reports smoking a pack a day.   -Previously prescribed nicotine patches but these were not covered by his insurance.  We will prescribe him Wellbutrin or Chantix, once he completes chemoradiation treatments.  We do encourage smoking cessation for overall health and cancer prevention.

## 2023-08-28 ENCOUNTER — Ambulatory Visit
Admission: RE | Admit: 2023-08-28 | Discharge: 2023-08-28 | Disposition: A | Discharge status: Home / Self Care | Payer: Medicare HMO | Source: Ambulatory Visit | Attending: Radiation Oncology

## 2023-08-28 ENCOUNTER — Inpatient Hospital Stay: Payer: Medicare HMO

## 2023-08-28 ENCOUNTER — Other Ambulatory Visit: Payer: Self-pay

## 2023-08-28 VITALS — BP 105/67 | HR 88 | Temp 98.0°F | Resp 16

## 2023-08-28 DIAGNOSIS — Z510 Encounter for antineoplastic radiation therapy: Secondary | ICD-10-CM | POA: Diagnosis not present

## 2023-08-28 DIAGNOSIS — C051 Malignant neoplasm of soft palate: Secondary | ICD-10-CM

## 2023-08-28 DIAGNOSIS — C321 Malignant neoplasm of supraglottis: Secondary | ICD-10-CM | POA: Diagnosis not present

## 2023-08-28 DIAGNOSIS — Z5111 Encounter for antineoplastic chemotherapy: Secondary | ICD-10-CM | POA: Diagnosis not present

## 2023-08-28 DIAGNOSIS — R232 Flushing: Secondary | ICD-10-CM

## 2023-08-28 DIAGNOSIS — C328 Malignant neoplasm of overlapping sites of larynx: Secondary | ICD-10-CM | POA: Diagnosis not present

## 2023-08-28 LAB — RAD ONC ARIA SESSION SUMMARY
Course Elapsed Days: 28
Plan Fractions Treated to Date: 19
Plan Prescribed Dose Per Fraction: 2 Gy
Plan Total Fractions Prescribed: 35
Plan Total Prescribed Dose: 70 Gy
Reference Point Dosage Given to Date: 38 Gy
Reference Point Session Dosage Given: 2 Gy
Session Number: 19

## 2023-08-28 MED ORDER — SODIUM CHLORIDE 0.9 % IV SOLN: INTRAVENOUS | Status: DC

## 2023-08-28 MED ORDER — SODIUM CHLORIDE 0.9 % IV SOLN
150.0000 mg | Freq: Once | INTRAVENOUS | Status: AC
  Administered 2023-08-28: 150 mg via INTRAVENOUS
  Filled 2023-08-28: qty 150

## 2023-08-28 MED ORDER — SODIUM CHLORIDE 0.9% FLUSH
10.0000 mL | INTRAVENOUS | Status: DC | PRN
Start: 2023-08-28 — End: 2023-08-28
  Administered 2023-08-28: 10 mL

## 2023-08-28 MED ORDER — PALONOSETRON HCL INJECTION 0.25 MG/5ML
0.2500 mg | Freq: Once | INTRAVENOUS | Status: AC
  Administered 2023-08-28: 0.25 mg via INTRAVENOUS
  Filled 2023-08-28: qty 5

## 2023-08-28 MED ORDER — DEXAMETHASONE SODIUM PHOSPHATE 10 MG/ML IJ SOLN
10.0000 mg | Freq: Once | INTRAMUSCULAR | Status: AC
  Administered 2023-08-28: 10 mg via INTRAVENOUS
  Filled 2023-08-28: qty 1

## 2023-08-28 MED ORDER — OXYCODONE HCL 5 MG PO TABS
10.0000 mg | ORAL_TABLET | Freq: Once | ORAL | Status: AC
Start: 2023-08-28 — End: 2023-08-28
  Administered 2023-08-28: 10 mg via ORAL
  Filled 2023-08-28: qty 2

## 2023-08-28 MED ORDER — LIDOCAINE VISCOUS HCL 2 % MT SOLN
15.0000 mL | Freq: Once | OROMUCOSAL | Status: AC
  Administered 2023-08-28: 15 mL via OROMUCOSAL
  Filled 2023-08-28: qty 15

## 2023-08-28 MED ORDER — HEPARIN SOD (PORK) LOCK FLUSH 100 UNIT/ML IV SOLN
500.0000 [IU] | Freq: Once | INTRAVENOUS | Status: AC | PRN
  Administered 2023-08-28: 500 [IU]

## 2023-08-28 MED ORDER — MAGNESIUM SULFATE 2 GM/50ML IV SOLN
2.0000 g | Freq: Once | INTRAVENOUS | Status: AC
  Administered 2023-08-28: 2 g via INTRAVENOUS
  Filled 2023-08-28: qty 50

## 2023-08-28 MED ORDER — SODIUM CHLORIDE 0.9 % IV SOLN
40.0000 mg/m2 | Freq: Once | INTRAVENOUS | Status: AC
  Administered 2023-08-28: 70 mg via INTRAVENOUS
  Filled 2023-08-28: qty 70

## 2023-08-28 MED ORDER — POTASSIUM CHLORIDE IN NACL 20-0.9 MEQ/L-% IV SOLN
Freq: Once | INTRAVENOUS | Status: AC
  Filled 2023-08-28: qty 1000

## 2023-08-28 NOTE — Progress Notes (Signed)
Nutrition Follow-up:  Patient with SCC of oropharynx (soft palate, supraglottis with right lymph node involvement.  Patient receiving concurrent cisplatin and radiation.  PEG tube in place  Met with patient during infusion. Reports that he is giving about 3 cartons of osmolite 1.5 via PEG tube.  He is eating orally.  Says yesterday he ate some pudding and drank and ensure shake.  Overall oral intake poor.    Patient says that he has not heard from [employer] regarding enteral supplies. Also said that he does not answer calls he does not recognize or check voicemail.  Medications: reviewed  Labs: Na 133, glucose 103  Anthropometrics:   Weight 140 lb 8 oz on 1/17  137 lb 8 oz on 1/13 139 lb 8 oz on 1/6 144 lb 8 oz on 12/23 141 lb on 12/19   NUTRITION DIAGNOSIS: Sub optimal energy intake  ongoing with radiation   INTERVENTION:  Encouraged patient to increase osmolite 1.5 to at least 4 cartons via feeding tube daily.  Flush with 60ml of water before and after each feeding.  If unable to drink 2 ensure daily will need to use 2 more cartons of osmolite via feeding tube Continue 2 ensure plus shakes orally and soft foods daily Patient gave RD another number to give to [employer] (spouse Holley Raring).  RD provided this number to [employer] representative.  Discussed importance of returning calls from [employer].     MONITORING, EVALUATION, GOAL: weight trends, tube feeding   NEXT VISIT: Monday, Jan 27 during infusion  Yolonda Purtle B. Freida Busman, RD, LDN Registered Dietitian [phone]

## 2023-08-28 NOTE — Patient Instructions (Signed)
 [facility] - A DEPT OF [facility]  Discharge Instructions: Thank you for choosing [facility] to provide your oncology and hematology care.   If you have a lab appointment with the Cancer Center, please go directly to the Cancer Center and check in at the registration area.   Wear comfortable clothing and clothing appropriate for easy access to any Portacath or PICC line.   We strive to give you quality time with your provider. You may need to reschedule your appointment if you arrive late (15 or more minutes).  Arriving late affects you and other patients whose appointments are after yours.  Also, if you miss three or more appointments without notifying the office, you may be dismissed from the clinic at the provider's discretion.      For prescription refill requests, have your pharmacy contact our office and allow 72 hours for refills to be completed.    Today you received the following chemotherapy and/or immunotherapy agents: cisplatin      To help prevent nausea and vomiting after your treatment, we encourage you to take your nausea medication as directed.  BELOW ARE SYMPTOMS THAT SHOULD BE REPORTED IMMEDIATELY: *FEVER GREATER THAN 100.4 F (38 C) OR HIGHER *CHILLS OR SWEATING *NAUSEA AND VOMITING THAT IS NOT CONTROLLED WITH YOUR NAUSEA MEDICATION *UNUSUAL SHORTNESS OF BREATH *UNUSUAL BRUISING OR BLEEDING *URINARY PROBLEMS (pain or burning when urinating, or frequent urination) *BOWEL PROBLEMS (unusual diarrhea, constipation, pain near the anus) TENDERNESS IN MOUTH AND THROAT WITH OR WITHOUT PRESENCE OF ULCERS (sore throat, sores in mouth, or a toothache) UNUSUAL RASH, SWELLING OR PAIN  UNUSUAL VAGINAL DISCHARGE OR ITCHING   Items with * indicate a potential emergency and should be followed up as soon as possible or go to the Emergency Department if any problems should occur.  Please show the CHEMOTHERAPY ALERT CARD or IMMUNOTHERAPY  ALERT CARD at check-in to the Emergency Department and triage nurse.  Should you have questions after your visit or need to cancel or reschedule your appointment, please contact [facility] - A DEPT OF [facility]  Dept: [phone]  and follow the prompts.  Office hours are 8:00 a.m. to 4:30 p.m. Monday - Friday. Please note that voicemails left after 4:00 p.m. may not be returned until the following business day.  We are closed weekends and major holidays. You have access to a nurse at all times for urgent questions. Please call the main number to the clinic Dept: [phone] and follow the prompts.   For any non-urgent questions, you may also contact your provider using MyChart. We now offer e-Visits for anyone 25 and older to request care online for non-urgent symptoms. For details visit [email or web address].   Also download the MyChart app! Go to the app store, search "MyChart", open the app, select [place], and log in with your MyChart username and password.

## 2023-08-29 ENCOUNTER — Ambulatory Visit
Admission: RE | Admit: 2023-08-29 | Discharge: 2023-08-29 | Disposition: A | Discharge status: Home / Self Care | Payer: Medicare HMO | Source: Ambulatory Visit | Attending: Radiation Oncology | Admitting: Radiation Oncology

## 2023-08-29 ENCOUNTER — Ambulatory Visit: Payer: Medicare HMO

## 2023-08-29 ENCOUNTER — Other Ambulatory Visit: Payer: Self-pay

## 2023-08-29 DIAGNOSIS — C321 Malignant neoplasm of supraglottis: Secondary | ICD-10-CM | POA: Diagnosis not present

## 2023-08-29 DIAGNOSIS — C328 Malignant neoplasm of overlapping sites of larynx: Secondary | ICD-10-CM | POA: Diagnosis not present

## 2023-08-29 DIAGNOSIS — Z510 Encounter for antineoplastic radiation therapy: Secondary | ICD-10-CM | POA: Diagnosis not present

## 2023-08-29 DIAGNOSIS — C051 Malignant neoplasm of soft palate: Secondary | ICD-10-CM | POA: Diagnosis not present

## 2023-08-29 LAB — RAD ONC ARIA SESSION SUMMARY
Course Elapsed Days: 29
Plan Fractions Treated to Date: 20
Plan Prescribed Dose Per Fraction: 2 Gy
Plan Total Fractions Prescribed: 35
Plan Total Prescribed Dose: 70 Gy
Reference Point Dosage Given to Date: 40 Gy
Reference Point Session Dosage Given: 2 Gy
Session Number: 20

## 2023-08-30 ENCOUNTER — Other Ambulatory Visit: Payer: Self-pay

## 2023-08-30 ENCOUNTER — Ambulatory Visit
Admission: RE | Admit: 2023-08-30 | Discharge: 2023-08-30 | Disposition: A | Discharge status: Home / Self Care | Payer: Medicare HMO | Source: Ambulatory Visit | Attending: Radiation Oncology

## 2023-08-30 DIAGNOSIS — C321 Malignant neoplasm of supraglottis: Secondary | ICD-10-CM | POA: Diagnosis not present

## 2023-08-30 DIAGNOSIS — C051 Malignant neoplasm of soft palate: Secondary | ICD-10-CM | POA: Diagnosis not present

## 2023-08-30 DIAGNOSIS — Z510 Encounter for antineoplastic radiation therapy: Secondary | ICD-10-CM | POA: Diagnosis not present

## 2023-08-30 DIAGNOSIS — C328 Malignant neoplasm of overlapping sites of larynx: Secondary | ICD-10-CM | POA: Diagnosis not present

## 2023-08-30 LAB — RAD ONC ARIA SESSION SUMMARY
Course Elapsed Days: 30
Plan Fractions Treated to Date: 21
Plan Prescribed Dose Per Fraction: 2 Gy
Plan Total Fractions Prescribed: 35
Plan Total Prescribed Dose: 70 Gy
Reference Point Dosage Given to Date: 42 Gy
Reference Point Session Dosage Given: 2 Gy
Session Number: 21

## 2023-08-31 ENCOUNTER — Other Ambulatory Visit: Payer: Self-pay

## 2023-08-31 ENCOUNTER — Ambulatory Visit
Admission: RE | Admit: 2023-08-31 | Discharge: 2023-08-31 | Disposition: A | Discharge status: Home / Self Care | Payer: Medicare HMO | Source: Ambulatory Visit | Attending: Radiation Oncology | Admitting: Radiation Oncology

## 2023-08-31 DIAGNOSIS — C328 Malignant neoplasm of overlapping sites of larynx: Secondary | ICD-10-CM | POA: Diagnosis not present

## 2023-08-31 DIAGNOSIS — Z510 Encounter for antineoplastic radiation therapy: Secondary | ICD-10-CM | POA: Diagnosis not present

## 2023-08-31 DIAGNOSIS — C051 Malignant neoplasm of soft palate: Secondary | ICD-10-CM | POA: Diagnosis not present

## 2023-08-31 DIAGNOSIS — C321 Malignant neoplasm of supraglottis: Secondary | ICD-10-CM | POA: Diagnosis not present

## 2023-08-31 LAB — RAD ONC ARIA SESSION SUMMARY
Course Elapsed Days: 31
Plan Fractions Treated to Date: 22
Plan Prescribed Dose Per Fraction: 2 Gy
Plan Total Fractions Prescribed: 35
Plan Total Prescribed Dose: 70 Gy
Reference Point Dosage Given to Date: 44 Gy
Reference Point Session Dosage Given: 2 Gy
Session Number: 22

## 2023-09-01 ENCOUNTER — Encounter: Payer: Self-pay | Admitting: Oncology

## 2023-09-01 ENCOUNTER — Inpatient Hospital Stay: Payer: Medicare HMO

## 2023-09-01 ENCOUNTER — Other Ambulatory Visit: Payer: Self-pay

## 2023-09-01 ENCOUNTER — Inpatient Hospital Stay (HOSPITAL_BASED_OUTPATIENT_CLINIC_OR_DEPARTMENT_OTHER): Payer: Medicare HMO | Admitting: Oncology

## 2023-09-01 ENCOUNTER — Ambulatory Visit
Admission: RE | Admit: 2023-09-01 | Discharge: 2023-09-01 | Disposition: A | Discharge status: Home / Self Care | Payer: Medicare HMO | Source: Ambulatory Visit | Attending: Radiation Oncology | Admitting: Radiation Oncology

## 2023-09-01 VITALS — BP 109/60 | HR 101 | Temp 98.0°F | Resp 18 | Ht 69.0 in | Wt 139.1 lb

## 2023-09-01 DIAGNOSIS — D701 Agranulocytosis secondary to cancer chemotherapy: Secondary | ICD-10-CM

## 2023-09-01 DIAGNOSIS — Z85118 Personal history of other malignant neoplasm of bronchus and lung: Secondary | ICD-10-CM | POA: Diagnosis not present

## 2023-09-01 DIAGNOSIS — T451X5A Adverse effect of antineoplastic and immunosuppressive drugs, initial encounter: Secondary | ICD-10-CM | POA: Diagnosis not present

## 2023-09-01 DIAGNOSIS — F17218 Nicotine dependence, cigarettes, with other nicotine-induced disorders: Secondary | ICD-10-CM | POA: Diagnosis not present

## 2023-09-01 DIAGNOSIS — Z510 Encounter for antineoplastic radiation therapy: Secondary | ICD-10-CM | POA: Diagnosis not present

## 2023-09-01 DIAGNOSIS — G893 Neoplasm related pain (acute) (chronic): Secondary | ICD-10-CM

## 2023-09-01 DIAGNOSIS — D6959 Other secondary thrombocytopenia: Secondary | ICD-10-CM | POA: Diagnosis not present

## 2023-09-01 DIAGNOSIS — R232 Flushing: Secondary | ICD-10-CM

## 2023-09-01 DIAGNOSIS — C321 Malignant neoplasm of supraglottis: Secondary | ICD-10-CM | POA: Diagnosis not present

## 2023-09-01 DIAGNOSIS — C328 Malignant neoplasm of overlapping sites of larynx: Secondary | ICD-10-CM | POA: Diagnosis not present

## 2023-09-01 DIAGNOSIS — C051 Malignant neoplasm of soft palate: Secondary | ICD-10-CM

## 2023-09-01 DIAGNOSIS — C3492 Malignant neoplasm of unspecified part of left bronchus or lung: Secondary | ICD-10-CM

## 2023-09-01 DIAGNOSIS — Z5111 Encounter for antineoplastic chemotherapy: Secondary | ICD-10-CM | POA: Diagnosis not present

## 2023-09-01 LAB — BASIC METABOLIC PANEL - CANCER CENTER ONLY
Anion gap: 10 (ref 5–15)
BUN: 15 mg/dL (ref 8–23)
CO2: 30 mmol/L (ref 22–32)
Calcium: 9.2 mg/dL (ref 8.9–10.3)
Chloride: 92 mmol/L — ABNORMAL LOW (ref 98–111)
Creatinine: 0.59 mg/dL — ABNORMAL LOW (ref 0.61–1.24)
GFR, Estimated: >60 mL/min (ref >60)
Glucose, Bld: 111 mg/dL — ABNORMAL HIGH (ref 70–99)
Potassium: 4 mmol/L (ref 3.5–5.1)
Sodium: 132 mmol/L — ABNORMAL LOW (ref 135–145)

## 2023-09-01 LAB — RAD ONC ARIA SESSION SUMMARY
Course Elapsed Days: 32
Plan Fractions Treated to Date: 23
Plan Prescribed Dose Per Fraction: 2 Gy
Plan Total Fractions Prescribed: 35
Plan Total Prescribed Dose: 70 Gy
Reference Point Dosage Given to Date: 46 Gy
Reference Point Session Dosage Given: 2 Gy
Session Number: 23

## 2023-09-01 LAB — CBC WITH DIFFERENTIAL (CANCER CENTER ONLY)
Abs Immature Granulocytes: 0.01 10*3/uL (ref 0.00–0.07)
Basophils Absolute: 0 10*3/uL (ref 0.0–0.1)
Basophils Relative: 0 %
Eosinophils Absolute: 0 10*3/uL (ref 0.0–0.5)
Eosinophils Relative: 1 %
HCT: 34.4 % — ABNORMAL LOW (ref 39.0–52.0)
Hemoglobin: 11.8 g/dL — ABNORMAL LOW (ref 13.0–17.0)
Immature Granulocytes: 0 %
Lymphocytes Relative: 17 %
Lymphs Abs: 0.4 10*3/uL — ABNORMAL LOW (ref 0.7–4.0)
MCH: 33.1 pg (ref 26.0–34.0)
MCHC: 34.3 g/dL (ref 30.0–36.0)
MCV: 96.4 fL (ref 80.0–100.0)
Monocytes Absolute: 0.3 10*3/uL (ref 0.1–1.0)
Monocytes Relative: 12 %
Neutro Abs: 1.7 10*3/uL (ref 1.7–7.7)
Neutrophils Relative %: 70 %
Platelet Count: 100 10*3/uL — ABNORMAL LOW (ref 150–400)
RBC: 3.57 MIL/uL — ABNORMAL LOW (ref 4.22–5.81)
RDW: 13.2 % (ref 11.5–15.5)
WBC Count: 2.5 10*3/uL — ABNORMAL LOW (ref 4.0–10.5)
nRBC: 0 % (ref 0.0–0.2)

## 2023-09-01 LAB — MAGNESIUM: Magnesium: 1.7 mg/dL (ref 1.7–2.4)

## 2023-09-01 MED ORDER — SODIUM CHLORIDE 0.9% FLUSH
10.0000 mL | Freq: Once | INTRAVENOUS | Status: AC
  Administered 2023-09-01: 10 mL

## 2023-09-01 MED ORDER — HEPARIN SOD (PORK) LOCK FLUSH 100 UNIT/ML IV SOLN
500.0000 [IU] | Freq: Once | INTRAVENOUS | Status: AC
  Administered 2023-09-01: 500 [IU]

## 2023-09-01 MED FILL — Fosaprepitant Dimeglumine For IV Infusion 150 MG (Base Eq): INTRAVENOUS | Qty: 5 | Status: AC

## 2023-09-01 NOTE — Assessment & Plan Note (Signed)
-   Please see HPI above for details and past treatments at [facility], under the direction of Dr. Ellin Saba.  -Remains in clinical remission.  Recent PET/CT showed no evidence of disease recurrence as far as lung cancer is concerned.  Will continue surveillance for now.

## 2023-09-01 NOTE — Assessment & Plan Note (Signed)
Labs today revealed thrombocytopenia with platelet count of 100,000.  We can still proceed with chemotherapy at this level.  Plan to hold chemo if platelet count is below 75,000.

## 2023-09-01 NOTE — Assessment & Plan Note (Signed)
-  He is currently on oxycodone as needed with adequate pain control.  He was educated on judicial use.  Proposed fentanyl patch for better pain control but patient would like to think about this and let us know.

## 2023-09-01 NOTE — Assessment & Plan Note (Signed)
Reports smoking a pack a day.   -Previously prescribed nicotine patches but these were not covered by his insurance.     Provided information and assist with registration for the smoking cessation program.

## 2023-09-01 NOTE — Assessment & Plan Note (Signed)
Labs today reveal leukopenia with white count of 2500 but ANC is 1700.  Labs also showed thrombocytopenia with platelet count of 100,000.  We will dose reduce cisplatin to 30 mg/m with cycle 6 and proceed with this dose on 09/04/2023 as scheduled.  He will receive Neupogen for 2 days starting on 09/05/2023 at a dose of 300 mcg daily.

## 2023-09-01 NOTE — Assessment & Plan Note (Addendum)
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  Noted changes in voice and difficulty swallowing solid foods. No signs of lung cancer recurrence on PET scan.   He seems to have diffuse disease throughout much of his oropharynx.  This is not appearing obviously on his PET scan.  He also has right supraglottic cancer and a hypermetabolic right level 3 lymph node consistent with lymphatic spread from his head and neck primaries.  He is less likely to be a surgical candidate given the extent of his disease.  Hence recommendation made to proceed with concurrent chemoradiation and patient and his daughter were agreeable with this plan.  -He started concurrent chemoradiation with weekly cisplatin from 07/31/2023.  Has been tolerating treatments well without major side effects.  - Labs today reveal leukopenia with white count of 2500 but ANC is 1700.  Labs also showed thrombocytopenia with platelet count of 100,000.  We will dose reduce cisplatin to 30 mg/m with cycle 6 and proceed with this dose on 09/04/2023 as scheduled.  He will receive Neupogen for 2 days starting on 09/05/2023 at a dose of 300 mcg daily.    Cisplatin will be continued weekly during the course of radiation.

## 2023-09-01 NOTE — Progress Notes (Signed)
[facility]  ONCOLOGY CLINIC PROGRESS NOTE   Patient Care Team: [name], Wayland Salinas, MD as PCP - General (Internal Medicine) Wendall Stade, MD as PCP - Cardiology (Cardiology) Therese Sarah, RN as Oncology Nurse Navigator (Oncology) Doreatha Massed, MD as Medical Oncologist (Medical Oncology) Malmfelt, Lise Auer, RN as Oncology Nurse Navigator Lonie Peak, MD as Consulting Physician (Radiation Oncology) [name], MD as Consulting Physician (Oncology) Laren Boom, DO as Consulting Physician (Otolaryngology) Lonie Peak, MD as Attending Physician (Radiation Oncology)  PATIENT NAME: Bryan Fleming   MR#: [id number] DOB: 11-01-1960  Date of visit: 09/01/2023   ASSESSMENT & PLAN:   Bryan Fleming is a 63 y.o.  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   Cancer of soft palate (HCC) Recent diagnosis of squamous cell carcinoma in the supraglottis, oropharynx, multifocal disease.  Noted changes in voice and difficulty swallowing solid foods. No signs of lung cancer recurrence on PET scan.   He seems to have diffuse disease throughout much of his oropharynx.  This is not appearing obviously on his PET scan.  He also has right supraglottic cancer and a hypermetabolic right level 3 lymph node consistent with lymphatic spread from his head and neck primaries.  He is less likely to be a surgical candidate given the extent of his disease.  Hence recommendation made to proceed with concurrent chemoradiation and patient and his daughter were agreeable with  this plan.  -He started concurrent chemoradiation with weekly cisplatin from 07/31/2023.  Has been tolerating treatments well without major side effects.  - Labs today reveal leukopenia with white count of 2500 but ANC is 1700.  Labs also showed thrombocytopenia with platelet count of 100,000.  We will dose reduce cisplatin to 30 mg/m with cycle 6 and proceed with this dose on 09/04/2023 as scheduled.  He will receive Neupogen for 2 days starting on 09/05/2023 at a dose of 300 mcg daily.    Cisplatin will be continued weekly during the course of radiation.   Cancer associated pain - He is currently on oxycodone as needed with adequate pain control.  He was educated on judicial use.  Proposed fentanyl patch for better pain control but patient would like to think about this and let us know.  Leukopenia due to antineoplastic chemotherapy (HCC) Labs today reveal leukopenia with white count of 2500 but ANC is 1700.  Labs also showed thrombocytopenia with platelet count of 100,000.  We will dose reduce cisplatin to 30 mg/m with cycle 6 and proceed with this dose on 09/04/2023 as scheduled.  He will receive Neupogen for 2 days starting on 09/05/2023 at a dose of 300 mcg daily.    Chemotherapy-induced thrombocytopenia Labs today revealed thrombocytopenia with platelet count of 100,000.  We can still proceed with chemotherapy at this level.  Plan to hold chemo if platelet count is below 75,000.  Primary lung squamous cell carcinoma, left (HCC) - Please see HPI above for details and past treatments at [facility], under the direction of Dr. Ellin Saba.  -Remains in clinical remission.  Recent PET/CT showed no evidence of disease recurrence as far as lung cancer is concerned.  Will continue surveillance for now.  Nicotine dependence, cigarettes, with other nicotine-induced disorders Reports smoking a pack a day.   -Previously prescribed nicotine patches but these were not covered by his  insurance.     Provided information and assist with registration for the smoking cessation program.   RTC in 1 week for labs, follow-up and continuation of chemotherapy.   I reviewed lab results and outside records for this visit and discussed relevant results with the patient. Diagnosis, plan of care and treatment options were also discussed in detail with the patient. Opportunity provided to ask questions and answers provided to his apparent satisfaction. Provided instructions to call our clinic with any problems, questions or concerns prior to return visit. I recommended to continue follow-up with PCP and sub-specialists. He verbalized understanding and agreed with the plan.   NCCN guidelines have been consulted in the planning of this patient's care.  I spent a total of 30 minutes during this encounter with the patient including review of chart and various tests results, discussions about plan of care and coordination of care plan.   [name], MD  09/01/2023 11:48 AM  [facility] - A DEPT OF [facility] [address] [place] [address] Dept: [phone] Dept Fax: [phone]    CHIEF COMPLAINT/ REASON FOR VISIT:   Moderate to poorly differentiated squamous cell carcinoma of the right supraglottis and oropharynx, with nodal involvement, multifocal disease   Current Treatment: Concurrent chemoradiation with weekly cisplatin to start from 07/31/2023.  INTERVAL HISTORY:    Discussed the use of AI scribe software for clinical note transcription with the patient, who gave verbal consent to proceed.   Bryan Fleming is here today for repeat clinical assessment.    He reports persistent difficulty swallowing and has been reliant on tube feeds. He is staying hydrated, primarily with apple juice. The patient denies any nausea or vomiting. He is using mouthwashes and reports regular bowel movements. He has  sufficient pain medication and uses Xanax occasionally.  I have reviewed the past medical history, past surgical history, social history and family history with the patient and they are unchanged from previous note.  HISTORY OF PRESENT ILLNESS:   I have reviewed his chart and materials related to his cancer extensively and collaborated history with the patient. Summary of oncologic history is as follows:   Patient was being followed by Dr. Ellin Saba at [facility] for history of squamous cell carcinoma of left upper lobe of lung, diagnosed in May 2022.  He underwent neoadjuvant chemotherapy with 3 cycles of carboplatin, paclitaxel and nivolumab from 01/13/2021 through 02/24/2021 based on [id number]. This was followed by left upper lobectomy and lymph node resection on 04/21/2021. Pathology consistent with invasive moderately differentiated squamous cell carcinoma, 5 cm, margins negative.  Visceral pleura not involved.  Metastatic squamous cell carcinoma in 1/2 hilar lymph nodes.  Levels 5, 11, 12, 10, 11 and 12 lymph nodes were negative.  No definitive tumor response identified.  No lymphovascular invasion.  ypT2b, ypN1. Caris testing on resection sample from 04/21/2021 shows PD-L1 TPS 1%.  No targetable mutations.  MSI stable.  TMB low.    On routine restaging CT of the chest on 09/16/2022, there was no evidence of recurrent or metastatic disease in the chest.  However there was pancreatic ductal dilatation and hence CT abdomen was recommended.   On 11/02/2022 CT abdomen with and without contrast showed  1.4 x 1.1 cm focus of hypoenhancement/ hypoattenuation in the region of the ampulla.  No discrete mass identified.  This was associated with dilation of the main pancreatic duct.  MRI/MRCP was recommended.  MRCP on 01/09/2023 showed similar findings.   Patient was referred to Dr. Meridee Score, GI for further evaluation/ERCP.  Patient canceled few appointments and eventually established with Dr.  Meridee Score on 05/15/2023.  ERCP/EUS was obtained on 05/15/2023.  EGD showed obvious mass lesion noted at vallecular fold.  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  Congested major papula.  EUS showed pancreatic parenchymal abnormalities consisting of hyperechoic foci, lobularity and hyperechoic strands in the pancreatic head, genu of pancreas and pancreatic body and tail.  There was no sign of significant pathology in the CBD and in the common hepatic duct.  No malignant appearing lymph nodes.   On 06/19/2023, CT soft tissue of the neck showed 2.4 cm right supraglottic mass consistent with malignancy.  Pathologic right level 3 lymph node consistent with metastatic disease.  Asymmetric soft tissue in the left lateral upper oropharynx, indeterminate for second mucosal malignancy.  New 11 mm subsolid pulmonary nodule in the right upper lobe with additional smaller peribronchovascular groundglass densities elsewhere in the right upper lobe.  Most likely infectious or inflammatory, however attention recommended on PET/CT to exclude primary lung malignancy or metastatic disease.   Patient was referred to ENT Dr. Marene Lenz for further evaluation of incidentally found throat mass on EGD. Exam demonstrated 2 distinct exophytic lesions, one in the supraglottis and 1 in the oropharynx, both of which were concerning for primary head and neck cancer. Patient had a palpable lymph node in the right neck concerning for metastatic spread. He was taken to OR for microlaryngoscopy and biopsy on 07/04/2023.  Operative findings included exophytic mucosal mass involving the right arytenoid, aryepiglottic fold and postcricoid area.  Extension to the vallecula noted but no obvious mucosal abnormality of the epiglottis, false cords or true cords.  From, abnormal mucosal changes of the soft palate extending from the right anterior/posterior pillars to the left soft palate.  Uvula absent.  Firm palpable nodularity of the  right tonsil.  Pathology from postcricoid, right arytenoid, right aryepiglottic, left soft palate, right tonsil all came back positive for invasive squamous cell carcinoma, moderately to poorly differentiated.  p16 negative.   On 07/13/2023, staging PET/CT showed hypermetabolic right sided supraglottic mass consistent with the patient's known primary malignancy.  Hypermetabolic adjacent right level 3 lymph node consistent with metastatic disease.  No findings for distant metastatic disease involving the chest, abdomen or pelvis.  Stable surgical changes from left upper lobe lobectomy.  No findings for residual or recurrent lung cancer.  Started concurrent chemoradiation with weekly cisplatin on 07/31/2023.  Oncology History  Primary lung squamous cell carcinoma, left (HCC)  12/31/2020 Cancer Staging   Staging form: Lung, AJCC 8th Edition - Clinical stage from 12/31/2020: Stage IIIA (cT2b, cN2, cM0) - Signed by Doreatha Massed, MD on 01/01/2021 Stage prefix: Initial diagnosis   01/01/2021 Initial Diagnosis   Primary lung squamous cell carcinoma, left (HCC)   01/13/2021 - 02/24/2021 Chemotherapy   Patient is on Treatment Plan : LUNG NSCLC Carboplatin / Paclitaxel q21d x 3 cycles     Cancer of soft palate (HCC)  07/19/2023 Initial Diagnosis   Cancer of soft palate (HCC)   07/19/2023 Cancer Staging   Staging form: Pharynx - P16 Negative Oropharynx, AJCC 8th Edition - Clinical stage from 07/19/2023: Stage III (cT3, cN1, cM0, p16-) - Signed  by Lonie Peak, MD on 07/19/2023 Stage prefix: Initial diagnosis   07/31/2023 -  Chemotherapy   Patient is on Treatment Plan : HEAD/NECK Cisplatin (40) q7d     Malignant neoplasm of supraglottis (HCC)  07/19/2023 Initial Diagnosis   Malignant neoplasm of supraglottis (HCC)   07/31/2023 -  Chemotherapy   Patient is on Treatment Plan : HEAD/NECK Cisplatin (40) q7d         REVIEW OF SYSTEMS:   Review of Systems - Oncology  All other pertinent  systems were reviewed with the patient and are negative.  ALLERGIES: He has no known allergies.  MEDICATIONS:  Current Outpatient Medications  Medication Sig Dispense Refill   acetaminophen (TYLENOL) 500 MG tablet Take 2 tablets (1,000 mg total) by mouth every 6 (six) hours. 45 tablet 0   ALPRAZolam (XANAX) 0.5 MG tablet Take 1 tablet (0.5 mg total) by mouth at bedtime as needed for anxiety. 30 tablet 0   amLODipine (NORVASC) 5 MG tablet Take 5 mg by mouth in the morning.     dexamethasone (DECADRON) 4 MG tablet Take 2 tablets (8 mg) by mouth daily x 3 days starting the day after cisplatin chemotherapy. Take with food. 30 tablet 1   fluconazole (DIFLUCAN) 100 MG tablet Take 2 tablets today, then 1 tablet daily x 20 more days. 22 tablet 0   lidocaine (XYLOCAINE) 2 % solution Patient: Mix 1part 2% viscous lidocaine, 1part H20. Swish & swallow 10mL of diluted mixture, before meals and at bedtime, up to QID 200 mL 3   metoprolol tartrate (LOPRESSOR) 50 MG tablet Take 50 mg by mouth 2 (two) times daily.     Oxycodone HCl 10 MG TABS Take 1 tablet (10 mg total) by mouth every 4 (four) hours as needed. 100 tablet 0   potassium chloride SA (KLOR-CON M) 20 MEQ tablet Take 1 tablet (20 mEq total) by mouth 2 (two) times daily. 60 tablet 1   prochlorperazine (COMPAZINE) 10 MG tablet Take 1 tablet (10 mg total) by mouth every 6 (six) hours as needed (Nausea or vomiting). 30 tablet 1   sodium fluoride (FLUORISHIELD) 1.1 % GEL dental gel Place 1 Application onto teeth at bedtime.     albuterol (VENTOLIN HFA) 108 (90 Base) MCG/ACT inhaler Inhale 1-2 puffs into the lungs every 6 (six) hours as needed for wheezing or shortness of breath. (Patient not taking: Reported on 08/17/2023)     Fluticasone-Umeclidin-Vilant (TRELEGY ELLIPTA) 100-62.5-25 MCG/INH AEPB Inhale 1 puff into the lungs in the morning.     lidocaine-prilocaine (EMLA) cream Apply to affected area once (Patient not taking: Reported on 09/01/2023) 30  g 3   loratadine (CLARITIN) 10 MG tablet Take 10 mg by mouth daily. (Patient not taking: Reported on 08/25/2023)     Multiple Vitamin (MULTIVITAMIN WITH MINERALS) TABS tablet Take 1 tablet by mouth in the morning. (Patient not taking: Reported on 08/11/2023)     Nutritional Supplements (FEEDING SUPPLEMENT, OSMOLITE 1.5 CAL,) LIQD Give 2 cartons at 8am and noon via feeding tube.  Give 1 carton at 4pm and 8pm.  Flush with 60ml of water before and after each feeding.  Give additional 3 cups of water via tube for hydration if unable to drink orally. (Patient not taking: Reported on 09/01/2023)     omeprazole (PRILOSEC) 40 MG capsule Take 1 capsule (40 mg total) by mouth 2 (two) times daily. (Patient not taking: Reported on 09/01/2023) 60 capsule 12   ondansetron (ZOFRAN) 8 MG tablet Take 1  tablet (8 mg total) by mouth every 8 (eight) hours as needed for nausea or vomiting. Start on the third day after cisplatin. 30 tablet 1   No current facility-administered medications for this visit.     VITALS:   Blood pressure 109/60, pulse (!) 101, temperature 98 F (36.7 C), temperature source Temporal, resp. rate 18, height 5\' 9"  (1.753 m), weight 139 lb 1.6 oz (63.1 kg), SpO2 100%.  Wt Readings from Last 3 Encounters:  09/01/23 139 lb 1.6 oz (63.1 kg)  08/25/23 140 lb 8 oz (63.7 kg)  08/21/23 137 lb 8 oz (62.4 kg)    Body mass index is 20.54 kg/m.  Performance status (ECOG): 1 - Symptomatic but completely ambulatory  PHYSICAL EXAM:   Physical Exam Constitutional:      General: He is not in acute distress.    Appearance: Normal appearance.  HENT:     Head: Normocephalic and atraumatic.  Eyes:     General: No scleral icterus.    Conjunctiva/sclera: Conjunctivae normal.  Cardiovascular:     Rate and Rhythm: Normal rate and regular rhythm.     Heart sounds: Normal heart sounds.  Pulmonary:     Effort: Pulmonary effort is normal.     Breath sounds: Wheezing (scattered, bilateral) present.  Chest:      Comments: Right-sided Port-A-Cath in place, without any signs of infection Abdominal:     General: There is no distension.     Comments: Feeding tube in place  Musculoskeletal:     Right lower leg: No edema.     Left lower leg: No edema.  Neurological:     General: No focal deficit present.     Mental Status: He is alert and oriented to person, place, and time.  Psychiatric:        Mood and Affect: Mood normal.        Behavior: Behavior normal.        Thought Content: Thought content normal.       LABORATORY DATA:   I have reviewed the data as listed.  Results for orders placed or performed in visit on 09/01/23  Magnesium  Result Value Ref Range   Magnesium 1.7 1.7 - 2.4 mg/dL  Basic Metabolic Panel - Cancer Center Only  Result Value Ref Range   Sodium 132 (L) 135 - 145 mmol/L   Potassium 4.0 3.5 - 5.1 mmol/L   Chloride 92 (L) 98 - 111 mmol/L   CO2 30 22 - 32 mmol/L   Glucose, Bld 111 (H) 70 - 99 mg/dL   BUN 15 8 - 23 mg/dL   Creatinine 4.09 (L) 8.11 - 1.24 mg/dL   Calcium 9.2 8.9 - 91.4 mg/dL   GFR, Estimated >78 >29 mL/min   Anion gap 10 5 - 15  CBC with Differential (Cancer Center Only)  Result Value Ref Range   WBC Count 2.5 (L) 4.0 - 10.5 K/uL   RBC 3.57 (L) 4.22 - 5.81 MIL/uL   Hemoglobin 11.8 (L) 13.0 - 17.0 g/dL   HCT 56.2 (L) 13.0 - 86.5 %   MCV 96.4 80.0 - 100.0 fL   MCH 33.1 26.0 - 34.0 pg   MCHC 34.3 30.0 - 36.0 g/dL   RDW 78.4 69.6 - 29.5 %   Platelet Count 100 (L) 150 - 400 K/uL   nRBC 0.0 0.0 - 0.2 %   Neutrophils Relative % 70 %   Neutro Abs 1.7 1.7 - 7.7 K/uL   Lymphocytes Relative 17 %  Lymphs Abs 0.4 (L) 0.7 - 4.0 K/uL   Monocytes Relative 12 %   Monocytes Absolute 0.3 0.1 - 1.0 K/uL   Eosinophils Relative 1 %   Eosinophils Absolute 0.0 0.0 - 0.5 K/uL   Basophils Relative 0 %   Basophils Absolute 0.0 0.0 - 0.1 K/uL   Immature Granulocytes 0 %   Abs Immature Granulocytes 0.01 0.00 - 0.07 K/uL  Results for orders placed or performed  in visit on 09/01/23  Rad Onc Aria Session Summary  Result Value Ref Range   Course ID [id number]    Course Start Date 07/21/2023    Session Number 23    Course First Treatment Date 07/31/2023  2:33 PM    Course Last Treatment Date 09/01/2023 10:16 AM    Course Elapsed Days 32    Reference Point ID HN_Throat dp    Reference Point Dosage Given to Date 46 Gy   Reference Point Session Dosage Given 2 Gy   Plan ID HN_Throat    Plan Name HN_Throat    Plan Fractions Treated to Date 23    Plan Total Fractions Prescribed 35    Plan Prescribed Dose Per Fraction 2 Gy   Plan Total Prescribed Dose 70.000000 Gy   Plan Primary Reference Point HN_Throat dp      RADIOGRAPHIC STUDIES:  No recent pertinent imaging studies available to review.   CODE STATUS:  Code Status History     Date Active Date Inactive Code Status Order ID Comments User Context   07/27/2023 1558 07/28/2023 0516 Full Code [id number]  Bennie Dallas, MD [facility]   12/01/2022 2127 12/06/2022 1709 Full Code [id number]  Elgergawy, Leana Roe, MD ED   10/13/2022 1155 10/14/2022 1706 Full Code [id number]  [name], MD Inpatient   02/17/2022 1635 02/18/2022 1856 Full Code [id number]  [name], MD Inpatient   04/21/2021 2124 04/23/2021 2111 Full Code [id number]  Barrett, Allayne Butcher Inpatient   06/04/2020 1253 06/06/2020 1807 Full Code [id number]  Cleora Fleet, MD ED    Questions for Most Recent Historical Code Status (Order [id number])     Question Answer   By: Consent: discussion documented in EHR            No orders of the defined types were placed in this encounter.    Future Appointments  Date Time Provider Department Center  09/04/2023  7:30 AM [facility] INFUSION [facility] None  09/04/2023  9:00 AM Alphonse Guild, RD [facility] None  09/04/2023  3:15 PM [facility] [id number] [facility] None  09/04/2023  3:30 PM LINAC-SQUIRE [facility] None  09/05/2023  3:15 PM [facility] [id number] [facility] None   09/06/2023  3:15 PM [facility] [id number] [facility] None  09/07/2023  3:15 PM [facility] [id number] [facility] None  09/08/2023 10:45 AM [facility] [id number] [facility] None  09/08/2023 11:00 AM [facility] FLUSH [facility] None  09/08/2023 11:20 AM Margy Sumler, MD [facility] None  09/11/2023  7:30 AM [facility] INFUSION [facility] None  09/11/2023  9:00 AM Alphonse Guild, RD [facility] None  09/11/2023  3:15 PM [facility] [id number] [facility] None  09/12/2023  3:15 PM [facility] [id number] [facility] None  09/13/2023  3:15 PM [facility] [id number] [facility] None  09/14/2023  1:00 PM AP-ACAPA NURSE [facility] None  09/14/2023  2:00 PM AP-CT 1 AP-CT  H  09/14/2023  3:15 PM [facility] [id number] [facility] None  09/15/2023  3:15 PM [facility] [id number] [facility] None  09/18/2023  3:15 PM [facility] [id number] [facility] None  09/19/2023  3:15 PM [facility] [id number] [facility] None  09/21/2023 11:00 AM Dorene Ar, CCC-SLP [facility] None  09/21/2023  3:00 PM Doreatha Massed, MD [facility] None  10/10/2023 12:00 PM Jeanella Craze, Remi Deter, PT [facility] None      This document was completed utilizing speech recognition software. Grammatical errors, random word insertions, pronoun errors, and incomplete sentences are an occasional consequence of this system due to software limitations, ambient noise, and hardware issues. Any formal questions or concerns about the content, text or information contained within the body of this dictation should be directly addressed to the provider for clarification.

## 2023-09-04 ENCOUNTER — Inpatient Hospital Stay: Payer: Medicare HMO

## 2023-09-04 ENCOUNTER — Encounter: Payer: Self-pay | Admitting: Oncology

## 2023-09-04 ENCOUNTER — Other Ambulatory Visit: Payer: Self-pay

## 2023-09-04 ENCOUNTER — Ambulatory Visit: Payer: Medicare HMO

## 2023-09-04 ENCOUNTER — Other Ambulatory Visit: Payer: Self-pay | Admitting: Oncology

## 2023-09-04 ENCOUNTER — Ambulatory Visit
Admission: RE | Admit: 2023-09-04 | Discharge: 2023-09-04 | Disposition: A | Discharge status: Home / Self Care | Payer: Medicare HMO | Source: Ambulatory Visit | Attending: Radiation Oncology | Admitting: Radiation Oncology

## 2023-09-04 VITALS — BP 146/79 | HR 118 | Temp 98.2°F | Resp 16

## 2023-09-04 DIAGNOSIS — Z5111 Encounter for antineoplastic chemotherapy: Secondary | ICD-10-CM | POA: Diagnosis not present

## 2023-09-04 DIAGNOSIS — C051 Malignant neoplasm of soft palate: Secondary | ICD-10-CM

## 2023-09-04 DIAGNOSIS — C321 Malignant neoplasm of supraglottis: Secondary | ICD-10-CM

## 2023-09-04 DIAGNOSIS — C328 Malignant neoplasm of overlapping sites of larynx: Secondary | ICD-10-CM | POA: Diagnosis not present

## 2023-09-04 DIAGNOSIS — Z510 Encounter for antineoplastic radiation therapy: Secondary | ICD-10-CM | POA: Diagnosis not present

## 2023-09-04 DIAGNOSIS — R232 Flushing: Secondary | ICD-10-CM

## 2023-09-04 LAB — RAD ONC ARIA SESSION SUMMARY
Course Elapsed Days: 35
Plan Fractions Treated to Date: 24
Plan Prescribed Dose Per Fraction: 2 Gy
Plan Total Fractions Prescribed: 35
Plan Total Prescribed Dose: 70 Gy
Reference Point Dosage Given to Date: 48 Gy
Reference Point Session Dosage Given: 2 Gy
Session Number: 24

## 2023-09-04 MED ORDER — MAGNESIUM SULFATE 2 GM/50ML IV SOLN
2.0000 g | Freq: Once | INTRAVENOUS | Status: AC
  Administered 2023-09-04: 2 g via INTRAVENOUS
  Filled 2023-09-04: qty 50

## 2023-09-04 MED ORDER — SODIUM CHLORIDE 0.9 % IV SOLN
150.0000 mg | Freq: Once | INTRAVENOUS | Status: AC
  Administered 2023-09-04: 150 mg via INTRAVENOUS
  Filled 2023-09-04: qty 150

## 2023-09-04 MED ORDER — POTASSIUM CHLORIDE IN NACL 20-0.9 MEQ/L-% IV SOLN
Freq: Once | INTRAVENOUS | Status: AC
  Filled 2023-09-04: qty 1000

## 2023-09-04 MED ORDER — SODIUM CHLORIDE 0.9 % IV SOLN: INTRAVENOUS | Status: DC

## 2023-09-04 MED ORDER — DEXAMETHASONE SODIUM PHOSPHATE 10 MG/ML IJ SOLN
10.0000 mg | Freq: Once | INTRAMUSCULAR | Status: AC
  Administered 2023-09-04: 10 mg via INTRAVENOUS
  Filled 2023-09-04: qty 1

## 2023-09-04 MED ORDER — PALONOSETRON HCL INJECTION 0.25 MG/5ML
0.2500 mg | Freq: Once | INTRAVENOUS | Status: AC
  Administered 2023-09-04: 0.25 mg via INTRAVENOUS
  Filled 2023-09-04: qty 5

## 2023-09-04 MED ORDER — OXYCODONE HCL 5 MG PO TABS
10.0000 mg | ORAL_TABLET | Freq: Once | ORAL | Status: AC
  Administered 2023-09-04: 10 mg via ORAL
  Filled 2023-09-04: qty 2

## 2023-09-04 MED ORDER — LIDOCAINE VISCOUS HCL 2 % MT SOLN
15.0000 mL | Freq: Once | OROMUCOSAL | Status: AC
  Administered 2023-09-04: 15 mL via OROMUCOSAL
  Filled 2023-09-04: qty 15

## 2023-09-04 MED ORDER — SODIUM CHLORIDE 0.9 % IV SOLN
30.0000 mg/m2 | Freq: Once | INTRAVENOUS | Status: AC
  Administered 2023-09-04: 50 mg via INTRAVENOUS
  Filled 2023-09-04: qty 50

## 2023-09-04 NOTE — Progress Notes (Signed)
Nutrition Follow-up:  Patient with SCC of oropharynx (soft palate, supraglottis with right lymph node involvement.  Patient receiving concurrent cisplatin and radiation. PEG tube in place.    Met with patient during infusion.  Says that he got up early and gave a carton of osmolite via tube this am and then gave another carton just before coming.  Yesterday took 3 cartons of osmolite 1.5 via tube.  Says that he is not really eating anything much orally due to pain when he swallows.  Drinking 2 ensure daily per patient report and drinking liquids (water, etc)  Medications: reviewed  Labs: Na 132, glucose 111  Anthropometrics:   Weight 139 lb 1.6 oz on 1/24 140 lb 8 oz on 1/17 137 lb 8 oz on 1/13 139 lb 8 oz on 1/6 144 lb 8 oz on 12/23 141 lb on 12/19   NUTRITION DIAGNOSIS: Sub- optimal energy intake ongoing with radiation   INTERVENTION:  Stressed importance of giving tube feeding at least 4 cartons of osmolite 1.5 daily via PEG and drinking 2 cartons of ensure.  If unable to drink 2 cartons of ensure will need to add 2 additional cartons of osmolite via tube.  Flush with 60ml of water before and after feeding.   Discussed importance of hydration and if unable to drink orally will need to place water via tube for hydration.   Patient has not called [employer] back. He said he would get in touch with them today about enteral supplies     MONITORING, EVALUATION, GOAL: weight trends, intake, tube feeding   NEXT VISIT: Monday, Feb 3rd during infusion  Joyanne Eddinger B. Freida Busman, RD, LDN Registered Dietitian [phone]

## 2023-09-04 NOTE — Progress Notes (Signed)
Per Dr. Arlana Pouch, ok to treat with high HR

## 2023-09-04 NOTE — Progress Notes (Signed)
Patient had tachycardia on arrival to clinic today for his chemotherapy.  Heart rate was 118.  On review of records, he has had fluctuating heart rate close to 100s.  It was decided that it is okay to treat him today with this degree of tachycardia.  We will reevaluate this on return visit.

## 2023-09-04 NOTE — Patient Instructions (Signed)
[facility] - A DEPT OF [facility]  Discharge Instructions: Thank you for choosing [facility] to provide your oncology and hematology care.   If you have a lab appointment with the Cancer Center, please go directly to the Cancer Center and check in at the registration area.   Wear comfortable clothing and clothing appropriate for easy access to any Portacath or PICC line.   We strive to give you quality time with your provider. You may need to reschedule your appointment if you arrive late (15 or more minutes).  Arriving late affects you and other patients whose appointments are after yours.  Also, if you miss three or more appointments without notifying the office, you may be dismissed from the clinic at the provider's discretion.      For prescription refill requests, have your pharmacy contact our office and allow 72 hours for refills to be completed.    Today you received the following chemotherapy and/or immunotherapy agents cisplatin      To help prevent nausea and vomiting after your treatment, we encourage you to take your nausea medication as directed.  BELOW ARE SYMPTOMS THAT SHOULD BE REPORTED IMMEDIATELY: *FEVER GREATER THAN 100.4 F (38 C) OR HIGHER *CHILLS OR SWEATING *NAUSEA AND VOMITING THAT IS NOT CONTROLLED WITH YOUR NAUSEA MEDICATION *UNUSUAL SHORTNESS OF BREATH *UNUSUAL BRUISING OR BLEEDING *URINARY PROBLEMS (pain or burning when urinating, or frequent urination) *BOWEL PROBLEMS (unusual diarrhea, constipation, pain near the anus) TENDERNESS IN MOUTH AND THROAT WITH OR WITHOUT PRESENCE OF ULCERS (sore throat, sores in mouth, or a toothache) UNUSUAL RASH, SWELLING OR PAIN  UNUSUAL VAGINAL DISCHARGE OR ITCHING   Items with * indicate a potential emergency and should be followed up as soon as possible or go to the Emergency Department if any problems should occur.  Please show the CHEMOTHERAPY ALERT CARD or IMMUNOTHERAPY  ALERT CARD at check-in to the Emergency Department and triage nurse.  Should you have questions after your visit or need to cancel or reschedule your appointment, please contact [facility] - A DEPT OF [facility]  Dept: [phone]  and follow the prompts.  Office hours are 8:00 a.m. to 4:30 p.m. Monday - Friday. Please note that voicemails left after 4:00 p.m. may not be returned until the following business day.  We are closed weekends and major holidays. You have access to a nurse at all times for urgent questions. Please call the main number to the clinic Dept: [phone] and follow the prompts.   For any non-urgent questions, you may also contact your provider using MyChart. We now offer e-Visits for anyone 66 and older to request care online for non-urgent symptoms. For details visit [email or web address].   Also download the MyChart app! Go to the app store, search "MyChart", open the app, select [place], and log in with your MyChart username and password.

## 2023-09-05 ENCOUNTER — Ambulatory Visit
Admission: RE | Admit: 2023-09-05 | Discharge: 2023-09-05 | Disposition: A | Discharge status: Home / Self Care | Payer: Medicare HMO | Source: Ambulatory Visit | Attending: Radiation Oncology | Admitting: Radiation Oncology

## 2023-09-05 ENCOUNTER — Ambulatory Visit: Payer: Medicare HMO

## 2023-09-05 ENCOUNTER — Other Ambulatory Visit: Payer: Self-pay

## 2023-09-05 DIAGNOSIS — Z510 Encounter for antineoplastic radiation therapy: Secondary | ICD-10-CM | POA: Diagnosis not present

## 2023-09-05 DIAGNOSIS — C051 Malignant neoplasm of soft palate: Secondary | ICD-10-CM | POA: Diagnosis not present

## 2023-09-05 DIAGNOSIS — C321 Malignant neoplasm of supraglottis: Secondary | ICD-10-CM | POA: Diagnosis not present

## 2023-09-05 DIAGNOSIS — C328 Malignant neoplasm of overlapping sites of larynx: Secondary | ICD-10-CM | POA: Diagnosis not present

## 2023-09-05 LAB — RAD ONC ARIA SESSION SUMMARY
Course Elapsed Days: 36
Plan Fractions Treated to Date: 25
Plan Prescribed Dose Per Fraction: 2 Gy
Plan Total Fractions Prescribed: 35
Plan Total Prescribed Dose: 70 Gy
Reference Point Dosage Given to Date: 50 Gy
Reference Point Session Dosage Given: 2 Gy
Session Number: 25

## 2023-09-06 ENCOUNTER — Other Ambulatory Visit: Payer: Self-pay

## 2023-09-06 ENCOUNTER — Encounter: Payer: Self-pay | Admitting: Oncology

## 2023-09-06 ENCOUNTER — Ambulatory Visit
Admission: RE | Admit: 2023-09-06 | Discharge: 2023-09-06 | Disposition: A | Discharge status: Home / Self Care | Payer: Medicare HMO | Source: Ambulatory Visit | Attending: Radiation Oncology | Admitting: Radiation Oncology

## 2023-09-06 DIAGNOSIS — Z510 Encounter for antineoplastic radiation therapy: Secondary | ICD-10-CM | POA: Diagnosis not present

## 2023-09-06 DIAGNOSIS — C321 Malignant neoplasm of supraglottis: Secondary | ICD-10-CM | POA: Diagnosis not present

## 2023-09-06 DIAGNOSIS — C051 Malignant neoplasm of soft palate: Secondary | ICD-10-CM | POA: Diagnosis not present

## 2023-09-06 DIAGNOSIS — C328 Malignant neoplasm of overlapping sites of larynx: Secondary | ICD-10-CM | POA: Diagnosis not present

## 2023-09-06 LAB — RAD ONC ARIA SESSION SUMMARY
Course Elapsed Days: 37
Plan Fractions Treated to Date: 26
Plan Prescribed Dose Per Fraction: 2 Gy
Plan Total Fractions Prescribed: 35
Plan Total Prescribed Dose: 70 Gy
Reference Point Dosage Given to Date: 52 Gy
Reference Point Session Dosage Given: 2 Gy
Session Number: 26

## 2023-09-06 NOTE — Progress Notes (Signed)
Returned call to patient's spouse Bryan Fleming whom left a voicemail regarding gas cards.  Introduced myself as Dance movement psychotherapist and to offer available resources. Discussed qualifications to apply for one-time $1000 [employer] grant to assist with personal expenses such as gas cards while going through treatment. Advised what is needed to apply and asked when can they provide documents. She states on Mon 2/3. Advised to give to check-in staff and they will scan and email to myself/Bryan Fleming in my absence. Advised to complete paperwork after Radiation appointment at registration as well. He will then be given a green folder with copy of approval letter and expense sheet to review. Advised to contact me on Wednesday 2/5 to discuss in detail. Advised he may receive a gas card on that day if all of this has been complete. I will advise registration staff and Bryan Fleming he will be submitting information.  They have my card for any additional financial questions or concerns.

## 2023-09-07 ENCOUNTER — Other Ambulatory Visit: Payer: Self-pay | Admitting: Oncology

## 2023-09-07 ENCOUNTER — Other Ambulatory Visit: Payer: Self-pay

## 2023-09-07 ENCOUNTER — Ambulatory Visit
Admission: RE | Admit: 2023-09-07 | Discharge: 2023-09-07 | Disposition: A | Discharge status: Home / Self Care | Payer: Medicare HMO | Source: Ambulatory Visit | Attending: Radiation Oncology | Admitting: Radiation Oncology

## 2023-09-07 DIAGNOSIS — C321 Malignant neoplasm of supraglottis: Secondary | ICD-10-CM

## 2023-09-07 DIAGNOSIS — Z510 Encounter for antineoplastic radiation therapy: Secondary | ICD-10-CM | POA: Diagnosis not present

## 2023-09-07 DIAGNOSIS — G893 Neoplasm related pain (acute) (chronic): Secondary | ICD-10-CM

## 2023-09-07 DIAGNOSIS — C051 Malignant neoplasm of soft palate: Secondary | ICD-10-CM | POA: Diagnosis not present

## 2023-09-07 DIAGNOSIS — C328 Malignant neoplasm of overlapping sites of larynx: Secondary | ICD-10-CM | POA: Diagnosis not present

## 2023-09-07 LAB — RAD ONC ARIA SESSION SUMMARY
Course Elapsed Days: 38
Plan Fractions Treated to Date: 27
Plan Prescribed Dose Per Fraction: 2 Gy
Plan Total Fractions Prescribed: 35
Plan Total Prescribed Dose: 70 Gy
Reference Point Dosage Given to Date: 54 Gy
Reference Point Session Dosage Given: 2 Gy
Session Number: 27

## 2023-09-07 MED ORDER — OXYCODONE HCL 10 MG PO TABS: 10.0000 mg | ORAL_TABLET | ORAL | 0 refills | Status: DC | PRN

## 2023-09-07 MED ORDER — LIDOCAINE VISCOUS HCL 2 % MT SOLN
15.0000 mL | Freq: Once | OROMUCOSAL | Status: AC
  Administered 2023-09-07: 15 mL via OROMUCOSAL
  Filled 2023-09-07: qty 15

## 2023-09-07 MED ORDER — LIDOCAINE VISCOUS HCL 2 % MT SOLN: OROMUCOSAL | 3 refills | Status: DC

## 2023-09-08 ENCOUNTER — Other Ambulatory Visit: Payer: Self-pay

## 2023-09-08 ENCOUNTER — Ambulatory Visit
Admission: RE | Admit: 2023-09-08 | Discharge: 2023-09-08 | Disposition: A | Discharge status: Home / Self Care | Payer: Medicare HMO | Source: Ambulatory Visit | Attending: Radiation Oncology

## 2023-09-08 ENCOUNTER — Inpatient Hospital Stay: Payer: Medicare HMO

## 2023-09-08 ENCOUNTER — Encounter: Payer: Self-pay | Admitting: Oncology

## 2023-09-08 ENCOUNTER — Inpatient Hospital Stay (HOSPITAL_BASED_OUTPATIENT_CLINIC_OR_DEPARTMENT_OTHER): Payer: Medicare HMO | Admitting: Oncology

## 2023-09-08 VITALS — BP 99/69 | HR 114 | Temp 97.9°F | Resp 17 | Wt 136.8 lb

## 2023-09-08 DIAGNOSIS — C321 Malignant neoplasm of supraglottis: Secondary | ICD-10-CM | POA: Diagnosis not present

## 2023-09-08 DIAGNOSIS — R232 Flushing: Secondary | ICD-10-CM

## 2023-09-08 DIAGNOSIS — C328 Malignant neoplasm of overlapping sites of larynx: Secondary | ICD-10-CM | POA: Diagnosis not present

## 2023-09-08 DIAGNOSIS — G893 Neoplasm related pain (acute) (chronic): Secondary | ICD-10-CM | POA: Diagnosis not present

## 2023-09-08 DIAGNOSIS — D6959 Other secondary thrombocytopenia: Secondary | ICD-10-CM | POA: Diagnosis not present

## 2023-09-08 DIAGNOSIS — C051 Malignant neoplasm of soft palate: Secondary | ICD-10-CM | POA: Diagnosis not present

## 2023-09-08 DIAGNOSIS — Z5111 Encounter for antineoplastic chemotherapy: Secondary | ICD-10-CM | POA: Diagnosis not present

## 2023-09-08 DIAGNOSIS — F17218 Nicotine dependence, cigarettes, with other nicotine-induced disorders: Secondary | ICD-10-CM | POA: Diagnosis not present

## 2023-09-08 DIAGNOSIS — D701 Agranulocytosis secondary to cancer chemotherapy: Secondary | ICD-10-CM | POA: Diagnosis not present

## 2023-09-08 DIAGNOSIS — T451X5A Adverse effect of antineoplastic and immunosuppressive drugs, initial encounter: Secondary | ICD-10-CM

## 2023-09-08 DIAGNOSIS — Z510 Encounter for antineoplastic radiation therapy: Secondary | ICD-10-CM | POA: Diagnosis not present

## 2023-09-08 LAB — RAD ONC ARIA SESSION SUMMARY
Course Elapsed Days: 39
Plan Fractions Treated to Date: 28
Plan Prescribed Dose Per Fraction: 2 Gy
Plan Total Fractions Prescribed: 35
Plan Total Prescribed Dose: 70 Gy
Reference Point Dosage Given to Date: 56 Gy
Reference Point Session Dosage Given: 2 Gy
Session Number: 28

## 2023-09-08 LAB — CBC WITH DIFFERENTIAL (CANCER CENTER ONLY)
Abs Immature Granulocytes: 0.02 10*3/uL (ref 0.00–0.07)
Basophils Absolute: 0 10*3/uL (ref 0.0–0.1)
Basophils Relative: 0 %
Eosinophils Absolute: 0 10*3/uL (ref 0.0–0.5)
Eosinophils Relative: 0 %
HCT: 31.7 % — ABNORMAL LOW (ref 39.0–52.0)
Hemoglobin: 10.8 g/dL — ABNORMAL LOW (ref 13.0–17.0)
Immature Granulocytes: 1 %
Lymphocytes Relative: 16 %
Lymphs Abs: 0.5 10*3/uL — ABNORMAL LOW (ref 0.7–4.0)
MCH: 32.5 pg (ref 26.0–34.0)
MCHC: 34.1 g/dL (ref 30.0–36.0)
MCV: 95.5 fL (ref 80.0–100.0)
Monocytes Absolute: 0.7 10*3/uL (ref 0.1–1.0)
Monocytes Relative: 21 %
Neutro Abs: 2.2 10*3/uL (ref 1.7–7.7)
Neutrophils Relative %: 62 %
Platelet Count: 208 10*3/uL (ref 150–400)
RBC: 3.32 MIL/uL — ABNORMAL LOW (ref 4.22–5.81)
RDW: 13.4 % (ref 11.5–15.5)
WBC Count: 3.5 10*3/uL — ABNORMAL LOW (ref 4.0–10.5)
nRBC: 0 % (ref 0.0–0.2)

## 2023-09-08 LAB — BASIC METABOLIC PANEL - CANCER CENTER ONLY
Anion gap: 8 (ref 5–15)
BUN: 15 mg/dL (ref 8–23)
CO2: 30 mmol/L (ref 22–32)
Calcium: 9 mg/dL (ref 8.9–10.3)
Chloride: 95 mmol/L — ABNORMAL LOW (ref 98–111)
Creatinine: 0.56 mg/dL — ABNORMAL LOW (ref 0.61–1.24)
GFR, Estimated: >60 mL/min (ref >60)
Glucose, Bld: 126 mg/dL — ABNORMAL HIGH (ref 70–99)
Potassium: 3.9 mmol/L (ref 3.5–5.1)
Sodium: 133 mmol/L — ABNORMAL LOW (ref 135–145)

## 2023-09-08 LAB — MAGNESIUM: Magnesium: 1.6 mg/dL — ABNORMAL LOW (ref 1.7–2.4)

## 2023-09-08 MED ORDER — MAGNESIUM OXIDE -MG SUPPLEMENT 400 (240 MG) MG PO TABS: 400.0000 mg | ORAL_TABLET | Freq: Every day | ORAL | 0 refills | Status: AC

## 2023-09-08 MED ORDER — HEPARIN SOD (PORK) LOCK FLUSH 100 UNIT/ML IV SOLN
500.0000 [IU] | Freq: Once | INTRAVENOUS | Status: AC
  Administered 2023-09-08: 500 [IU]

## 2023-09-08 MED ORDER — SODIUM CHLORIDE 0.9% FLUSH
10.0000 mL | Freq: Once | INTRAVENOUS | Status: AC
  Administered 2023-09-08: 10 mL

## 2023-09-08 MED ORDER — OXYCODONE HCL 20 MG PO TABS: 20.0000 mg | ORAL_TABLET | Freq: Four times a day (QID) | ORAL | 0 refills | Status: DC | PRN

## 2023-09-08 NOTE — Assessment & Plan Note (Addendum)
-   He is currently on oxycodone as needed with adequate pain control.  He was educated on judicial use.  Proposed fentanyl patch for better pain control but patient deferred this option.  Explained to him that we will wean him off of oxycodone, once chemoradiation related side effects subside.  He verbalized understanding.

## 2023-09-08 NOTE — Assessment & Plan Note (Signed)
 Reports smoking a pack a day.   -Previously prescribed nicotine patches but these were not covered by his insurance.     Provided information and assist with registration for the smoking cessation program.

## 2023-09-08 NOTE — Assessment & Plan Note (Signed)
Labs today revealed hypomagnesemia with magnesium of 1.6.  He was started on magnesium oxide 400 mg orally daily.

## 2023-09-08 NOTE — Assessment & Plan Note (Addendum)
Labs today reveal leukopenia with white count of 3500 but ANC is 2200. We dose reduced cisplatin to 30 mg/m with cycle 6 and we will proceed with this dose for cycle 7 as scheduled on 09/11/2023.  Currently no indication for Neupogen, since this will be the last of his planned chemotherapy.

## 2023-09-08 NOTE — Assessment & Plan Note (Addendum)
Thrombocytopenia is now resolved.

## 2023-09-08 NOTE — Progress Notes (Signed)
[facility]  ONCOLOGY CLINIC PROGRESS NOTE   Patient Care Team: [name], Wayland Salinas, MD as PCP - General (Internal Medicine) Wendall Stade, MD as PCP - Cardiology (Cardiology) Therese Sarah, RN as Oncology Nurse Navigator (Oncology) Doreatha Massed, MD as Medical Oncologist (Medical Oncology) Malmfelt, Lise Auer, RN as Oncology Nurse Navigator Lonie Peak, MD as Consulting Physician (Radiation Oncology) [name], MD as Consulting Physician (Oncology) Laren Boom, DO as Consulting Physician (Otolaryngology) Lonie Peak, MD as Attending Physician (Radiation Oncology)  PATIENT NAME: Bryan Fleming   MR#: [id number] DOB: [birth year]  Date of visit: 09/08/2023   ASSESSMENT & PLAN:   Bryan Fleming is a 63 y.o. 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   Cancer of soft palate (HCC) Recent diagnosis of squamous cell carcinoma in the supraglottis, oropharynx, multifocal disease.  Noted changes in voice and difficulty swallowing solid foods. No signs of lung cancer recurrence on PET scan.   He seems to have diffuse disease throughout much of his oropharynx.  This is not appearing obviously on his PET scan.  He also has right supraglottic cancer and a hypermetabolic right level 3 lymph node consistent with lymphatic spread from his head and neck primaries.  He was less likely to be a surgical candidate given the extent of his disease.  Hence recommendation made to proceed with concurrent chemoradiation and patient and his daughter were agreeable with  this plan.  -He started concurrent chemoradiation with weekly cisplatin from 07/31/2023.  Has been tolerating treatments well without major side effects.  - Labs today reveal leukopenia with white count of 3500 but ANC is 2200.  Platelet count is now normal at 208,000.  We will proceed with dose reduced cisplatin at 30 mg/m with cycle 7 on 09/11/2023 as scheduled.  This will complete the planned course of chemotherapy for him.  He is scheduled to complete radiation treatments on 09/15/2023.  Discussed surveillance as per NCCN guidelines going forward.  Patient opted to resume follow-up with Dr. Ellin Saba at [facility] since it is closer to his home.  We respect his wishes.  He does have appointment with Dr. Ellin Saba already scheduled on 09/21/2023.   Leukopenia due to antineoplastic chemotherapy (HCC) Labs today reveal leukopenia with white count of 3500 but ANC is 2200. We dose reduced cisplatin to 30 mg/m with cycle 6 and we will proceed with this dose for cycle 7 as scheduled on 09/11/2023.  Currently no indication for Neupogen, since this will be the last of his planned chemotherapy.  Chemotherapy-induced thrombocytopenia Thrombocytopenia is now resolved.  Cancer associated pain - He is currently on oxycodone as needed with adequate pain control.  He was educated on judicial use.  Proposed fentanyl patch for better pain control but patient deferred this option.  Explained to him that we will wean him off of oxycodone, once chemoradiation related side effects subside.  He verbalized understanding.  Nicotine dependence, cigarettes, with other nicotine-induced disorders Reports smoking a pack a day.   -Previously prescribed nicotine patches but these were not covered by his insurance.     Provided information and assist with registration for the smoking cessation  program.  Hypomagnesemia Labs today revealed hypomagnesemia with magnesium of 1.6.  He was started on magnesium  oxide 400 mg orally daily.  He will resume follow-ups at [facility] under the direction of Dr. Ellin Saba going forward.  Currently appointment scheduled on 09/21/2023.   I reviewed lab results and outside records for this visit and discussed relevant results with the patient. Diagnosis, plan of care and treatment options were also discussed in detail with the patient. Opportunity provided to ask questions and answers provided to his apparent satisfaction. Provided instructions to call our clinic with any problems, questions or concerns prior to return visit. I recommended to continue follow-up with PCP and sub-specialists. He verbalized understanding and agreed with the plan.   NCCN guidelines have been consulted in the planning of this patient's care.  I spent a total of 40 minutes during this encounter with the patient including review of chart and various tests results, discussions about plan of care and coordination of care plan.   [name], MD  09/08/2023 12:38 PM  [facility] - A DEPT [facility] [address] [place] [address] Dept: [phone] Dept Fax: [phone]    CHIEF COMPLAINT/ REASON FOR VISIT:   Moderate to poorly differentiated squamous cell carcinoma of the right supraglottis and oropharynx, with nodal involvement, multifocal disease   Current Treatment: Concurrent chemoradiation with weekly cisplatin started from 07/31/2023.  Completed cisplatin as of 09/11/2023.  INTERVAL HISTORY:    Discussed the use of AI scribe software for clinical note transcription with the patient, who gave verbal consent to proceed.   Bryan Fleming is here today for repeat clinical assessment.    He reports increased pain and difficulty swallowing. He reports that his pain has been changing a lot and is requesting a stronger pain medication. He is currently prescribed oxycodone, which he  has been taking four times a day. However, he is concerned about running out of medication if he increases his dosage to two tablets at a time.  In addition to his pain, he has been experiencing difficulty swallowing, even with water. He has been using a feeding tube mostly and drinking what he can. He is hopeful that his symptoms will improve as his body heals from his radiation treatments.  He also mentions that he has an upcoming appointment with Dr. Ellin Saba and prefers to continue seeing him for convenience. He is scheduled for his last chemotherapy treatment next Monday.  I have reviewed the past medical history, past surgical history, social history and family history with the patient and they are unchanged from previous note.  HISTORY OF PRESENT ILLNESS:   I have reviewed his chart and materials related to his cancer extensively and collaborated history with the patient. Summary of oncologic history is as follows:   Patient was being followed by Dr. Ellin Saba at [facility] for history of squamous cell carcinoma of left upper lobe of lung, diagnosed in May 2022.  He underwent neoadjuvant chemotherapy with 3 cycles of carboplatin, paclitaxel and nivolumab from 01/13/2021 through 02/24/2021 based on [id number]. This was followed by left upper lobectomy and lymph node resection on 04/21/2021. Pathology consistent with invasive moderately differentiated squamous cell carcinoma, 5 cm, margins negative.  Visceral pleura not involved.  Metastatic squamous cell carcinoma in 1/2 hilar lymph nodes.  Levels 5, 11, 12, 10, 11 and 12 lymph nodes were negative.  No definitive tumor  response identified.  No lymphovascular invasion.  ypT2b, ypN1. Caris testing on resection sample from 04/21/2021 shows PD-L1 TPS 1%.  No targetable mutations.  MSI stable.  TMB low.    On routine restaging CT of the chest on 09/16/2022, there was no evidence of recurrent or metastatic disease in the chest.  However  there was pancreatic ductal dilatation and hence CT abdomen was recommended.   On 11/02/2022 CT abdomen with and without contrast showed 1.4 x 1.1 cm focus of hypoenhancement/ hypoattenuation in the region of the ampulla.  No discrete mass identified.  This was associated with dilation of the main pancreatic duct.  MRI/MRCP was recommended.  MRCP on 01/09/2023 showed similar findings.   Patient was referred to Dr. Meridee Score, GI for further evaluation/ERCP.  Patient canceled few appointments and eventually established with Dr. Meridee Score on 05/15/2023.  ERCP/EUS was obtained on 05/15/2023.  EGD showed obvious mass lesion noted at vallecular fold.  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  Congested major papula.  EUS showed pancreatic parenchymal abnormalities consisting of hyperechoic foci, lobularity and hyperechoic strands in the pancreatic head, genu of pancreas and pancreatic body and tail.  There was no sign of significant pathology in the CBD and in the common hepatic duct.  No malignant appearing lymph nodes.   On 06/19/2023, CT soft tissue of the neck showed 2.4 cm right supraglottic mass consistent with malignancy.  Pathologic right level 3 lymph node consistent with metastatic disease.  Asymmetric soft tissue in the left lateral upper oropharynx, indeterminate for second mucosal malignancy.  New 11 mm subsolid pulmonary nodule in the right upper lobe with additional smaller peribronchovascular groundglass densities elsewhere in the right upper lobe.  Most likely infectious or inflammatory, however attention recommended on PET/CT to exclude primary lung malignancy or metastatic disease.   Patient was referred to ENT Dr. Marene Lenz for further evaluation of incidentally found throat mass on EGD. Exam demonstrated 2 distinct exophytic lesions, one in the supraglottis and 1 in the oropharynx, both of which were concerning for primary head and neck cancer. Patient had a palpable lymph node in  the right neck concerning for metastatic spread. He was taken to OR for microlaryngoscopy and biopsy on 07/04/2023.  Operative findings included exophytic mucosal mass involving the right arytenoid, aryepiglottic fold and postcricoid area.  Extension to the vallecula noted but no obvious mucosal abnormality of the epiglottis, false cords or true cords.  From, abnormal mucosal changes of the soft palate extending from the right anterior/posterior pillars to the left soft palate.  Uvula absent.  Firm palpable nodularity of the right tonsil.  Pathology from postcricoid, right arytenoid, right aryepiglottic, left soft palate, right tonsil all came back positive for invasive squamous cell carcinoma, moderately to poorly differentiated.  p16 negative.   On 07/13/2023, staging PET/CT showed hypermetabolic right sided supraglottic mass consistent with the patient's known primary malignancy.  Hypermetabolic adjacent right level 3 lymph node consistent with metastatic disease.  No findings for distant metastatic disease involving the chest, abdomen or pelvis.  Stable surgical changes from left upper lobe lobectomy.  No findings for residual or recurrent lung cancer.  Started concurrent chemoradiation with weekly cisplatin on 07/31/2023.  Completed cisplatin treatments as of 09/11/2023.  Oncology History  Primary lung squamous cell carcinoma, left (HCC)  12/31/2020 Cancer Staging   Staging form: Lung, AJCC 8th Edition - Clinical stage from 12/31/2020: Stage IIIA (cT2b, cN2, cM0) - Signed by Doreatha Massed, MD on 01/01/2021 Stage prefix: Initial diagnosis  01/01/2021 Initial Diagnosis   Primary lung squamous cell carcinoma, left (HCC)   01/13/2021 - 02/24/2021 Chemotherapy   Patient is on Treatment Plan : LUNG NSCLC Carboplatin / Paclitaxel q21d x 3 cycles     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)  07/19/2023 Initial Diagnosis   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  (HCC)   07/19/2023 Cancer Staging   Staging form: Larynx - Supraglottis, AJCC 8th Edition - Clinical: Stage III (cT3, cN1, cM0) - Signed by [name], MD on 09/08/2023   Cancer of soft palate (HCC)  07/19/2023 Initial Diagnosis   Cancer of soft palate (HCC)   07/19/2023 Cancer Staging   Staging form: Pharynx - P16 Negative Oropharynx, AJCC 8th Edition - Clinical stage from 07/19/2023: Stage III (cT3, cN1, cM0, p16-) - Signed by Lonie Peak, MD on 07/19/2023 Stage prefix: Initial diagnosis   07/31/2023 -  Chemotherapy   Patient is on Treatment Plan : HEAD/NECK Cisplatin (40) q7d     Malignant neoplasm of supraglottis (HCC) (Resolved)  07/19/2023 Initial Diagnosis   Malignant neoplasm of supraglottis (HCC)       REVIEW OF SYSTEMS:   Review of Systems - Oncology  All other pertinent systems were reviewed with the patient and are negative.  ALLERGIES: He has no known allergies.  MEDICATIONS:  Current Outpatient Medications  Medication Sig Dispense Refill   magnesium oxide (MAG-OX) 400 (240 Mg) MG tablet Take 1 tablet (400 mg total) by mouth daily. 30 tablet 0   Oxycodone HCl 20 MG TABS Take 1 tablet (20 mg total) by mouth every 6 (six) hours as needed. 100 tablet 0   acetaminophen (TYLENOL) 500 MG tablet Take 2 tablets (1,000 mg total) by mouth every 6 (six) hours. 45 tablet 0   albuterol (VENTOLIN HFA) 108 (90 Base) MCG/ACT inhaler Inhale 1-2 puffs into the lungs every 6 (six) hours as needed for wheezing or shortness of breath. (Patient not taking: Reported on 08/17/2023)     ALPRAZolam (XANAX) 0.5 MG tablet Take 1 tablet (0.5 mg total) by mouth at bedtime as needed for anxiety. 30 tablet 0   amLODipine (NORVASC) 5 MG tablet Take 5 mg by mouth in the morning.     dexamethasone (DECADRON) 4 MG tablet Take 2 tablets (8 mg) by mouth daily x 3 days starting the day after cisplatin chemotherapy. Take with food. 30 tablet 1   fluconazole (DIFLUCAN) 100 MG tablet Take 2 tablets  today, then 1 tablet daily x 20 more days. 22 tablet 0   Fluticasone-Umeclidin-Vilant (TRELEGY ELLIPTA) 100-62.5-25 MCG/INH AEPB Inhale 1 puff into the lungs in the morning.     lidocaine (XYLOCAINE) 2 % solution Patient: Mix 1part 2% viscous lidocaine, 1part H20. Swish & swallow 10mL of diluted mixture, before meals and at bedtime, up to QID 200 mL 3   lidocaine-prilocaine (EMLA) cream Apply to affected area once (Patient not taking: Reported on 09/01/2023) 30 g 3   loratadine (CLARITIN) 10 MG tablet Take 10 mg by mouth daily. (Patient not taking: Reported on 08/25/2023)     metoprolol tartrate (LOPRESSOR) 50 MG tablet Take 50 mg by mouth 2 (two) times daily.     Multiple Vitamin (MULTIVITAMIN WITH MINERALS) TABS tablet Take 1 tablet by mouth in the morning. (Patient not taking: Reported on 08/11/2023)     Nutritional Supplements (FEEDING SUPPLEMENT, OSMOLITE 1.5 CAL,) LIQD Give 2 cartons at 8am and noon via feeding tube.  Give 1 carton at 4pm and  8pm.  Flush with 60ml of water before and after each feeding.  Give additional 3 cups of water via tube for hydration if unable to drink orally. (Patient not taking: Reported on 09/01/2023)     omeprazole (PRILOSEC) 40 MG capsule Take 1 capsule (40 mg total) by mouth 2 (two) times daily. (Patient not taking: Reported on 09/01/2023) 60 capsule 12   ondansetron (ZOFRAN) 8 MG tablet Take 1 tablet (8 mg total) by mouth every 8 (eight) hours as needed for nausea or vomiting. Start on the third day after cisplatin. 30 tablet 1   potassium chloride SA (KLOR-CON M) 20 MEQ tablet Take 1 tablet (20 mEq total) by mouth 2 (two) times daily. 60 tablet 1   prochlorperazine (COMPAZINE) 10 MG tablet Take 1 tablet (10 mg total) by mouth every 6 (six) hours as needed (Nausea or vomiting). 30 tablet 1   sodium fluoride (FLUORISHIELD) 1.1 % GEL dental gel Place 1 Application onto teeth at bedtime.     No current facility-administered medications for this visit.     VITALS:    Blood pressure 99/69, pulse (!) 114, temperature 97.9 F (36.6 C), temperature source Temporal, resp. rate 17, weight 136 lb 12.8 oz (62.1 kg), SpO2 98%.  Wt Readings from Last 3 Encounters:  09/08/23 136 lb 12.8 oz (62.1 kg)  09/01/23 139 lb 1.6 oz (63.1 kg)  08/25/23 140 lb 8 oz (63.7 kg)    Body mass index is 20.2 kg/m.   Onc Performance Status - 09/08/23 1100       ECOG Perf Status   ECOG Perf Status Restricted in physically strenuous activity but ambulatory and able to carry out work of a light or sedentary nature, e.g., light house work, office work      KPS SCALE   KPS % SCORE Able to carry on normal activity, minor s/s of disease             PHYSICAL EXAM:   Physical Exam Constitutional:      General: He is not in acute distress.    Appearance: Normal appearance.  HENT:     Head: Normocephalic and atraumatic.  Eyes:     General: No scleral icterus.    Conjunctiva/sclera: Conjunctivae normal.  Cardiovascular:     Rate and Rhythm: Normal rate and regular rhythm.     Heart sounds: Normal heart sounds.  Pulmonary:     Effort: Pulmonary effort is normal.     Breath sounds: Wheezing (scattered, bilateral) present.  Chest:     Comments: Right-sided Port-A-Cath in place, without any signs of infection Abdominal:     General: There is no distension.     Comments: Feeding tube in place  Musculoskeletal:     Right lower leg: No edema.     Left lower leg: No edema.  Neurological:     General: No focal deficit present.     Mental Status: He is alert and oriented to person, place, and time.  Psychiatric:        Mood and Affect: Mood normal.        Behavior: Behavior normal.        Thought Content: Thought content normal.       LABORATORY DATA:   I have reviewed the data as listed.  Results for orders placed or performed in visit on 09/08/23  Rad Onc Aria Session Summary  Result Value Ref Range   Course ID [id number]    Course Start Date 07/21/2023  Session Number 28    Course First Treatment Date 07/31/2023  2:33 PM    Course Last Treatment Date 09/08/2023 11:18 AM    Course Elapsed Days 39    Reference Point ID HN_Throat dp    Reference Point Dosage Given to Date 55.9999999999999 Gy   Reference Point Session Dosage Given 2 Gy   Plan ID HN_Throat    Plan Name HN_Throat    Plan Fractions Treated to Date 28    Plan Total Fractions Prescribed 35    Plan Prescribed Dose Per Fraction 2 Gy   Plan Total Prescribed Dose 70.000000 Gy   Plan Primary Reference Point HN_Throat dp   Results for orders placed or performed in visit on 09/08/23  CBC with Differential (Cancer Center Only)  Result Value Ref Range   WBC Count 3.5 (L) 4.0 - 10.5 K/uL   RBC 3.32 (L) 4.22 - 5.81 MIL/uL   Hemoglobin 10.8 (L) 13.0 - 17.0 g/dL   HCT 98.1 (L) 19.1 - 47.8 %   MCV 95.5 80.0 - 100.0 fL   MCH 32.5 26.0 - 34.0 pg   MCHC 34.1 30.0 - 36.0 g/dL   RDW 29.5 62.1 - 30.8 %   Platelet Count 208 150 - 400 K/uL   nRBC 0.0 0.0 - 0.2 %   Neutrophils Relative % 62 %   Neutro Abs 2.2 1.7 - 7.7 K/uL   Lymphocytes Relative 16 %   Lymphs Abs 0.5 (L) 0.7 - 4.0 K/uL   Monocytes Relative 21 %   Monocytes Absolute 0.7 0.1 - 1.0 K/uL   Eosinophils Relative 0 %   Eosinophils Absolute 0.0 0.0 - 0.5 K/uL   Basophils Relative 0 %   Basophils Absolute 0.0 0.0 - 0.1 K/uL   Immature Granulocytes 1 %   Abs Immature Granulocytes 0.02 0.00 - 0.07 K/uL  Basic Metabolic Panel - Cancer Center Only  Result Value Ref Range   Sodium 133 (L) 135 - 145 mmol/L   Potassium 3.9 3.5 - 5.1 mmol/L   Chloride 95 (L) 98 - 111 mmol/L   CO2 30 22 - 32 mmol/L   Glucose, Bld 126 (H) 70 - 99 mg/dL   BUN 15 8 - 23 mg/dL   Creatinine 6.57 (L) 8.46 - 1.24 mg/dL   Calcium 9.0 8.9 - 96.2 mg/dL   GFR, Estimated >95 >28 mL/min   Anion gap 8 5 - 15  Magnesium  Result Value Ref Range   Magnesium 1.6 (L) 1.7 - 2.4 mg/dL     RADIOGRAPHIC STUDIES:  No recent pertinent imaging studies available to  review.   CODE STATUS:  Code Status History     Date Active Date Inactive Code Status Order ID Comments User Context   07/27/2023 1558 07/28/2023 0516 Full Code [id number]  Bennie Dallas, MD [facility]   12/01/2022 2127 12/06/2022 1709 Full Code [id number]  Elgergawy, Leana Roe, MD ED   10/13/2022 1155 10/14/2022 1706 Full Code [id number]  [name], MD Inpatient   02/17/2022 1635 02/18/2022 1856 Full Code [id number]  [name], MD Inpatient   04/21/2021 2124 04/23/2021 2111 Full Code [id number]  Barrett, Allayne Butcher Inpatient   06/04/2020 1253 06/06/2020 1807 Full Code [id number]  Cleora Fleet, MD ED    Questions for Most Recent Historical Code Status (Order [id number])     Question Answer   By: Consent: discussion documented in EHR            No orders of the defined  types were placed in this encounter.    Future Appointments  Date Time Provider Department Center  09/11/2023  7:30 AM [facility] INFUSION [facility] None  09/11/2023  9:00 AM Alphonse Guild, RD [facility] None  09/11/2023  3:15 PM [facility] [id number] [facility] None  09/11/2023  3:30 PM LINAC-SQUIRE [facility] None  09/12/2023  3:15 PM [facility] [id number] [facility] None  09/13/2023 11:15 AM [facility] LINAC 4 [facility] None  09/14/2023 11:15 AM [facility] LINAC 4 [facility] None  09/14/2023  1:00 PM AP-ACAPA NURSE [facility] None  09/14/2023  2:00 PM AP-CT 1 AP-CT [place] [facility]  09/15/2023 11:15 AM [facility] LINAC 4 [facility] None  09/18/2023  3:15 PM [facility] [id number] [facility] None  09/19/2023  3:15 PM [facility] [id number] [facility] None  09/21/2023 11:00 AM Dorene Ar, CCC-SLP [facility] None  09/21/2023  3:00 PM Doreatha Massed, MD [facility] None  10/10/2023 12:00 PM Breedlove Blue, Remi Deter, PT [facility] None      This document was completed utilizing speech recognition software. Grammatical errors, random word insertions, pronoun errors, and incomplete sentences are an occasional  consequence of this system due to software limitations, ambient noise, and hardware issues. Any formal questions or concerns about the content, text or information contained within the body of this dictation should be directly addressed to the provider for clarification.

## 2023-09-08 NOTE — Assessment & Plan Note (Addendum)
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  Noted changes in voice and difficulty swallowing solid foods. No signs of lung cancer recurrence on PET scan.   He seems to have diffuse disease throughout much of his oropharynx.  This is not appearing obviously on his PET scan.  He also has right supraglottic cancer and a hypermetabolic right level 3 lymph node consistent with lymphatic spread from his head and neck primaries.  He was less likely to be a surgical candidate given the extent of his disease.  Hence recommendation made to proceed with concurrent chemoradiation and patient and his daughter were agreeable with this plan.  -He started concurrent chemoradiation with weekly cisplatin from 07/31/2023.  Has been tolerating treatments well without major side effects.  - Labs today reveal leukopenia with white count of 3500 but ANC is 2200.  Platelet count is now normal at 208,000.  We will proceed with dose reduced cisplatin at 30 mg/m with cycle 7 on 09/11/2023 as scheduled.  This will complete the planned course of chemotherapy for him.  He is scheduled to complete radiation treatments on 09/15/2023.  Discussed surveillance as per NCCN guidelines going forward.  Patient opted to resume follow-up with Dr. Ellin Saba at [facility] cancer [facility] since it is closer to his home.  We respect his wishes.  He does have appointment with Dr. Ellin Saba already scheduled on 09/21/2023.

## 2023-09-11 ENCOUNTER — Inpatient Hospital Stay: Payer: Medicare HMO

## 2023-09-11 ENCOUNTER — Other Ambulatory Visit: Payer: Self-pay

## 2023-09-11 ENCOUNTER — Ambulatory Visit
Admission: RE | Admit: 2023-09-11 | Discharge: 2023-09-11 | Disposition: A | Discharge status: Home / Self Care | Payer: Medicare HMO | Source: Ambulatory Visit | Attending: Radiation Oncology | Admitting: Radiation Oncology

## 2023-09-11 ENCOUNTER — Ambulatory Visit: Payer: Medicare HMO

## 2023-09-11 ENCOUNTER — Inpatient Hospital Stay: Payer: Medicare HMO | Attending: Hematology

## 2023-09-11 VITALS — HR 108 | Temp 97.7°F | Resp 18 | Ht 69.0 in | Wt 135.0 lb

## 2023-09-11 DIAGNOSIS — Z510 Encounter for antineoplastic radiation therapy: Secondary | ICD-10-CM | POA: Diagnosis not present

## 2023-09-11 DIAGNOSIS — Z5111 Encounter for antineoplastic chemotherapy: Secondary | ICD-10-CM | POA: Diagnosis present

## 2023-09-11 DIAGNOSIS — J387 Other diseases of larynx: Secondary | ICD-10-CM | POA: Diagnosis present

## 2023-09-11 DIAGNOSIS — G893 Neoplasm related pain (acute) (chronic): Secondary | ICD-10-CM | POA: Diagnosis present

## 2023-09-11 DIAGNOSIS — G629 Polyneuropathy, unspecified: Secondary | ICD-10-CM | POA: Diagnosis not present

## 2023-09-11 DIAGNOSIS — C328 Malignant neoplasm of overlapping sites of larynx: Secondary | ICD-10-CM | POA: Insufficient documentation

## 2023-09-11 DIAGNOSIS — Z800 Family history of malignant neoplasm of digestive organs: Secondary | ICD-10-CM | POA: Diagnosis not present

## 2023-09-11 DIAGNOSIS — K219 Gastro-esophageal reflux disease without esophagitis: Secondary | ICD-10-CM | POA: Diagnosis not present

## 2023-09-11 DIAGNOSIS — C321 Malignant neoplasm of supraglottis: Secondary | ICD-10-CM | POA: Insufficient documentation

## 2023-09-11 DIAGNOSIS — F17210 Nicotine dependence, cigarettes, uncomplicated: Secondary | ICD-10-CM | POA: Insufficient documentation

## 2023-09-11 DIAGNOSIS — C3412 Malignant neoplasm of upper lobe, left bronchus or lung: Secondary | ICD-10-CM | POA: Insufficient documentation

## 2023-09-11 DIAGNOSIS — R232 Flushing: Secondary | ICD-10-CM

## 2023-09-11 DIAGNOSIS — C771 Secondary and unspecified malignant neoplasm of intrathoracic lymph nodes: Secondary | ICD-10-CM | POA: Insufficient documentation

## 2023-09-11 DIAGNOSIS — I10 Essential (primary) hypertension: Secondary | ICD-10-CM | POA: Diagnosis not present

## 2023-09-11 DIAGNOSIS — C051 Malignant neoplasm of soft palate: Secondary | ICD-10-CM | POA: Diagnosis not present

## 2023-09-11 LAB — RAD ONC ARIA SESSION SUMMARY
Course Elapsed Days: 42
Plan Fractions Treated to Date: 29
Plan Prescribed Dose Per Fraction: 2 Gy
Plan Total Fractions Prescribed: 35
Plan Total Prescribed Dose: 70 Gy
Reference Point Dosage Given to Date: 58 Gy
Reference Point Session Dosage Given: 2 Gy
Session Number: 29

## 2023-09-11 MED ORDER — OXYCODONE HCL 5 MG PO TABS
10.0000 mg | ORAL_TABLET | Freq: Once | ORAL | Status: AC
  Administered 2023-09-11: 10 mg via ORAL
  Filled 2023-09-11: qty 2

## 2023-09-11 MED ORDER — LIDOCAINE VISCOUS HCL 2 % MT SOLN
15.0000 mL | Freq: Once | OROMUCOSAL | Status: AC
  Administered 2023-09-11: 15 mL via OROMUCOSAL
  Filled 2023-09-11: qty 15

## 2023-09-11 MED ORDER — POTASSIUM CHLORIDE IN NACL 20-0.9 MEQ/L-% IV SOLN
Freq: Once | INTRAVENOUS | Status: AC
  Filled 2023-09-11: qty 1000

## 2023-09-11 MED ORDER — DEXAMETHASONE SODIUM PHOSPHATE 10 MG/ML IJ SOLN
10.0000 mg | Freq: Once | INTRAMUSCULAR | Status: AC
  Administered 2023-09-11: 10 mg via INTRAVENOUS
  Filled 2023-09-11: qty 1

## 2023-09-11 MED ORDER — PALONOSETRON HCL INJECTION 0.25 MG/5ML
0.2500 mg | Freq: Once | INTRAVENOUS | Status: AC
  Administered 2023-09-11: 0.25 mg via INTRAVENOUS
  Filled 2023-09-11: qty 5

## 2023-09-11 MED ORDER — MAGNESIUM SULFATE 2 GM/50ML IV SOLN
2.0000 g | Freq: Once | INTRAVENOUS | Status: AC
  Administered 2023-09-11: 2 g via INTRAVENOUS
  Filled 2023-09-11: qty 50

## 2023-09-11 MED ORDER — SODIUM CHLORIDE 0.9 % IV SOLN
30.0000 mg/m2 | Freq: Once | INTRAVENOUS | Status: AC
  Administered 2023-09-11: 50 mg via INTRAVENOUS
  Filled 2023-09-11: qty 50

## 2023-09-11 MED ORDER — SODIUM CHLORIDE 0.9 % IV SOLN
INTRAVENOUS | Status: DC
Start: 2023-09-11 — End: 2023-09-11

## 2023-09-11 MED ORDER — SODIUM CHLORIDE 0.9 % IV SOLN
150.0000 mg | Freq: Once | INTRAVENOUS | Status: AC
  Administered 2023-09-11: 150 mg via INTRAVENOUS
  Filled 2023-09-11: qty 150

## 2023-09-11 NOTE — Progress Notes (Signed)
Nutrition Follow-up:  Patient with SCC of oropharynx, (soft palate, supraglottis with right lymph node involvement.  Patient receiving cisplatin and radiation.  PEG tube in place. Last chemotherapy planned for today and last radiation on 2/11.  Met with patient during infusion.  Reports that he has been giving 2-3 cartons of osmolite 1.5 each day through tube for the last week.  Drinking 2 ensure plus shakes orally and eating some mashed potatoes, broth, jello.  Reports bowel movement every other day.  Reports some nausea but improved with medications.  Having pain on swallowing foods.     Planning follow-up at [facility] with Dr Kirtland Bouchard.     Medications: reviewed  Labs: Na 133, glucose 126, BUN 30, creatinine 0.56, Mag 1.6  Anthropometrics:   Weight 135 lb  139 lb 1.6 oz on 1/24 140 lb 8 oz on 1/17 137 lb 8 oz on 1/13 139 lb 8 oz on 1/6 144 lb 8 oz on 12/23 141 lb on 12/19  6% weight loss in the last month, significant   Estimated Energy Needs  Kcals: 2000-2300 Protein: 78-98 g Fluid: >2 L  NUTRITION DIAGNOSIS: Sub-optimal energy intake continues, relying on feeding tube   INTERVENTION:  Recommend patient increase tube feeding to 4 cartons of osmolite 1.5 daily.  Flush with 60ml of water before and after each feeding.   Drink two 350 calorie shakes orally.  If unable to drink shakes orally will need to give additional 2 cartons of osmolite 1.5 via feeding tube.   Provides (osmolite and oral shakes)  2120 calories, 91.6 g protein, free water (free water in formula and flush).  Needs to drink additional 3 cups fluid orally for additional hydration. Recommendations written for patient Continue to eat soft foods as tolerated Patient has received shipment of enteral supplies from [employer].  Provided patient with number of company today to re-order when gets to 1 week supply left.       MONITORING, EVALUATION, GOAL: weight trends, intake, tube feeding   NEXT VISIT:  Monday, Feb 24, phone call  Jaydon Soroka B. Freida Busman, RD, LDN Registered Dietitian [phone]

## 2023-09-11 NOTE — Progress Notes (Signed)
Per Dr. Arlana Pouch, ok to proceed with cisplatin today with high HR and low Mag. Ok to run post-hydration fluids concurrently with cisplatin.

## 2023-09-11 NOTE — Patient Instructions (Signed)
 [facility] - A DEPT OF [facility]  Discharge Instructions: Thank you for choosing [facility] to provide your oncology and hematology care.   If you have a lab appointment with the Cancer Center, please go directly to the Cancer Center and check in at the registration area.   Wear comfortable clothing and clothing appropriate for easy access to any Portacath or PICC line.   We strive to give you quality time with your provider. You may need to reschedule your appointment if you arrive late (15 or more minutes).  Arriving late affects you and other patients whose appointments are after yours.  Also, if you miss three or more appointments without notifying the office, you may be dismissed from the clinic at the provider's discretion.      For prescription refill requests, have your pharmacy contact our office and allow 72 hours for refills to be completed.    Today you received the following chemotherapy and/or immunotherapy agents cisplatin      To help prevent nausea and vomiting after your treatment, we encourage you to take your nausea medication as directed.  BELOW ARE SYMPTOMS THAT SHOULD BE REPORTED IMMEDIATELY: *FEVER GREATER THAN 100.4 F (38 C) OR HIGHER *CHILLS OR SWEATING *NAUSEA AND VOMITING THAT IS NOT CONTROLLED WITH YOUR NAUSEA MEDICATION *UNUSUAL SHORTNESS OF BREATH *UNUSUAL BRUISING OR BLEEDING *URINARY PROBLEMS (pain or burning when urinating, or frequent urination) *BOWEL PROBLEMS (unusual diarrhea, constipation, pain near the anus) TENDERNESS IN MOUTH AND THROAT WITH OR WITHOUT PRESENCE OF ULCERS (sore throat, sores in mouth, or a toothache) UNUSUAL RASH, SWELLING OR PAIN  UNUSUAL VAGINAL DISCHARGE OR ITCHING   Items with * indicate a potential emergency and should be followed up as soon as possible or go to the Emergency Department if any problems should occur.  Please show the CHEMOTHERAPY ALERT CARD or IMMUNOTHERAPY  ALERT CARD at check-in to the Emergency Department and triage nurse.  Should you have questions after your visit or need to cancel or reschedule your appointment, please contact [facility] - A DEPT OF [facility]  Dept: [phone]  and follow the prompts.  Office hours are 8:00 a.m. to 4:30 p.m. Monday - Friday. Please note that voicemails left after 4:00 p.m. may not be returned until the following business day.  We are closed weekends and major holidays. You have access to a nurse at all times for urgent questions. Please call the main number to the clinic Dept: [phone] and follow the prompts.   For any non-urgent questions, you may also contact your provider using MyChart. We now offer e-Visits for anyone 66 and older to request care online for non-urgent symptoms. For details visit [email or web address].   Also download the MyChart app! Go to the app store, search "MyChart", open the app, select [place], and log in with your MyChart username and password.

## 2023-09-12 ENCOUNTER — Ambulatory Visit
Admission: RE | Admit: 2023-09-12 | Discharge: 2023-09-12 | Disposition: A | Discharge status: Home / Self Care | Payer: Medicare HMO | Source: Ambulatory Visit | Attending: Radiation Oncology | Admitting: Radiation Oncology

## 2023-09-12 ENCOUNTER — Other Ambulatory Visit: Payer: Self-pay

## 2023-09-12 DIAGNOSIS — C328 Malignant neoplasm of overlapping sites of larynx: Secondary | ICD-10-CM | POA: Diagnosis not present

## 2023-09-12 DIAGNOSIS — G893 Neoplasm related pain (acute) (chronic): Secondary | ICD-10-CM | POA: Diagnosis not present

## 2023-09-12 DIAGNOSIS — C321 Malignant neoplasm of supraglottis: Secondary | ICD-10-CM | POA: Diagnosis not present

## 2023-09-12 DIAGNOSIS — Z510 Encounter for antineoplastic radiation therapy: Secondary | ICD-10-CM | POA: Diagnosis not present

## 2023-09-12 DIAGNOSIS — C051 Malignant neoplasm of soft palate: Secondary | ICD-10-CM | POA: Diagnosis not present

## 2023-09-12 LAB — RAD ONC ARIA SESSION SUMMARY
Course Elapsed Days: 43
Plan Fractions Treated to Date: 30
Plan Prescribed Dose Per Fraction: 2 Gy
Plan Total Fractions Prescribed: 35
Plan Total Prescribed Dose: 70 Gy
Reference Point Dosage Given to Date: 60 Gy
Reference Point Session Dosage Given: 2 Gy
Session Number: 30

## 2023-09-12 MED ORDER — LIDOCAINE VISCOUS HCL 2 % MT SOLN
15.0000 mL | Freq: Once | OROMUCOSAL | Status: AC
Start: 2023-09-12 — End: 2023-09-12
  Administered 2023-09-12: 15 mL via OROMUCOSAL
  Filled 2023-09-12: qty 15

## 2023-09-13 ENCOUNTER — Ambulatory Visit
Admission: RE | Admit: 2023-09-13 | Discharge: 2023-09-13 | Disposition: A | Discharge status: Home / Self Care | Payer: Medicare HMO | Source: Ambulatory Visit | Attending: Radiation Oncology

## 2023-09-13 ENCOUNTER — Other Ambulatory Visit: Payer: Self-pay

## 2023-09-13 DIAGNOSIS — Z510 Encounter for antineoplastic radiation therapy: Secondary | ICD-10-CM | POA: Diagnosis not present

## 2023-09-13 DIAGNOSIS — C051 Malignant neoplasm of soft palate: Secondary | ICD-10-CM | POA: Diagnosis not present

## 2023-09-13 DIAGNOSIS — G893 Neoplasm related pain (acute) (chronic): Secondary | ICD-10-CM | POA: Diagnosis not present

## 2023-09-13 DIAGNOSIS — C321 Malignant neoplasm of supraglottis: Secondary | ICD-10-CM | POA: Diagnosis not present

## 2023-09-13 DIAGNOSIS — C328 Malignant neoplasm of overlapping sites of larynx: Secondary | ICD-10-CM | POA: Diagnosis not present

## 2023-09-13 LAB — RAD ONC ARIA SESSION SUMMARY
Course Elapsed Days: 44
Plan Fractions Treated to Date: 31
Plan Prescribed Dose Per Fraction: 2 Gy
Plan Total Fractions Prescribed: 35
Plan Total Prescribed Dose: 70 Gy
Reference Point Dosage Given to Date: 62 Gy
Reference Point Session Dosage Given: 2 Gy
Session Number: 31

## 2023-09-14 ENCOUNTER — Ambulatory Visit (HOSPITAL_COMMUNITY)
Admission: RE | Admit: 2023-09-14 | Discharge: 2023-09-14 | Disposition: A | Discharge status: Home / Self Care | Payer: Medicare HMO | Source: Ambulatory Visit | Attending: Hematology | Admitting: Hematology

## 2023-09-14 ENCOUNTER — Ambulatory Visit
Admission: RE | Admit: 2023-09-14 | Discharge: 2023-09-14 | Disposition: A | Discharge status: Home / Self Care | Payer: Medicare HMO | Source: Ambulatory Visit | Attending: Radiation Oncology | Admitting: Radiation Oncology

## 2023-09-14 ENCOUNTER — Inpatient Hospital Stay: Payer: Medicare HMO

## 2023-09-14 ENCOUNTER — Other Ambulatory Visit: Payer: Self-pay

## 2023-09-14 DIAGNOSIS — Z85118 Personal history of other malignant neoplasm of bronchus and lung: Secondary | ICD-10-CM | POA: Diagnosis not present

## 2023-09-14 DIAGNOSIS — Z510 Encounter for antineoplastic radiation therapy: Secondary | ICD-10-CM | POA: Diagnosis not present

## 2023-09-14 DIAGNOSIS — C3492 Malignant neoplasm of unspecified part of left bronchus or lung: Secondary | ICD-10-CM

## 2023-09-14 DIAGNOSIS — C321 Malignant neoplasm of supraglottis: Secondary | ICD-10-CM | POA: Diagnosis not present

## 2023-09-14 DIAGNOSIS — Z5111 Encounter for antineoplastic chemotherapy: Secondary | ICD-10-CM | POA: Diagnosis not present

## 2023-09-14 DIAGNOSIS — J432 Centrilobular emphysema: Secondary | ICD-10-CM | POA: Diagnosis not present

## 2023-09-14 DIAGNOSIS — C328 Malignant neoplasm of overlapping sites of larynx: Secondary | ICD-10-CM | POA: Diagnosis not present

## 2023-09-14 DIAGNOSIS — C051 Malignant neoplasm of soft palate: Secondary | ICD-10-CM | POA: Diagnosis not present

## 2023-09-14 DIAGNOSIS — I700 Atherosclerosis of aorta: Secondary | ICD-10-CM | POA: Diagnosis not present

## 2023-09-14 DIAGNOSIS — G893 Neoplasm related pain (acute) (chronic): Secondary | ICD-10-CM | POA: Diagnosis not present

## 2023-09-14 LAB — CBC WITH DIFFERENTIAL/PLATELET
Abs Immature Granulocytes: 0.04 10*3/uL (ref 0.00–0.07)
Basophils Absolute: 0 10*3/uL (ref 0.0–0.1)
Basophils Relative: 0 %
Eosinophils Absolute: 0 10*3/uL (ref 0.0–0.5)
Eosinophils Relative: 0 %
HCT: 31.3 % — ABNORMAL LOW (ref 39.0–52.0)
Hemoglobin: 10.4 g/dL — ABNORMAL LOW (ref 13.0–17.0)
Immature Granulocytes: 1 %
Lymphocytes Relative: 12 %
Lymphs Abs: 0.7 10*3/uL (ref 0.7–4.0)
MCH: 32 pg (ref 26.0–34.0)
MCHC: 33.2 g/dL (ref 30.0–36.0)
MCV: 96.3 fL (ref 80.0–100.0)
Monocytes Absolute: 1.2 10*3/uL — ABNORMAL HIGH (ref 0.1–1.0)
Monocytes Relative: 19 %
Neutro Abs: 4.1 10*3/uL (ref 1.7–7.7)
Neutrophils Relative %: 68 %
Platelets: 248 10*3/uL (ref 150–400)
RBC: 3.25 MIL/uL — ABNORMAL LOW (ref 4.22–5.81)
RDW: 14 % (ref 11.5–15.5)
WBC: 6.1 10*3/uL (ref 4.0–10.5)
nRBC: 0 % (ref 0.0–0.2)

## 2023-09-14 LAB — COMPREHENSIVE METABOLIC PANEL
ALT: 11 U/L (ref 0–44)
AST: 17 U/L (ref 15–41)
Albumin: 3 g/dL — ABNORMAL LOW (ref 3.5–5.0)
Alkaline Phosphatase: 65 U/L (ref 38–126)
Anion gap: 12 (ref 5–15)
BUN: 19 mg/dL (ref 8–23)
CO2: 26 mmol/L (ref 22–32)
Calcium: 8.9 mg/dL (ref 8.9–10.3)
Chloride: 95 mmol/L — ABNORMAL LOW (ref 98–111)
Creatinine, Ser: 0.69 mg/dL (ref 0.61–1.24)
GFR, Estimated: >60 mL/min (ref >60)
Glucose, Bld: 125 mg/dL — ABNORMAL HIGH (ref 70–99)
Potassium: 4 mmol/L (ref 3.5–5.1)
Sodium: 133 mmol/L — ABNORMAL LOW (ref 135–145)
Total Bilirubin: 0.4 mg/dL (ref 0.0–1.2)
Total Protein: 7 g/dL (ref 6.5–8.1)

## 2023-09-14 LAB — RAD ONC ARIA SESSION SUMMARY
Course Elapsed Days: 45
Plan Fractions Treated to Date: 32
Plan Prescribed Dose Per Fraction: 2 Gy
Plan Total Fractions Prescribed: 35
Plan Total Prescribed Dose: 70 Gy
Reference Point Dosage Given to Date: 64 Gy
Reference Point Session Dosage Given: 2 Gy
Session Number: 32

## 2023-09-14 MED ORDER — HEPARIN SOD (PORK) LOCK FLUSH 100 UNIT/ML IV SOLN
500.0000 [IU] | Freq: Once | INTRAVENOUS | Status: AC
  Administered 2023-09-14: 500 [IU] via INTRAVENOUS

## 2023-09-14 MED ORDER — SODIUM CHLORIDE 0.9% FLUSH
10.0000 mL | INTRAVENOUS | Status: DC | PRN
  Administered 2023-09-14: 10 mL via INTRAVENOUS

## 2023-09-14 MED ORDER — IOHEXOL 300 MG/ML  SOLN
80.0000 mL | Freq: Once | INTRAMUSCULAR | Status: AC | PRN
  Administered 2023-09-14: 80 mL via INTRAVENOUS

## 2023-09-14 MED ORDER — HEPARIN SOD (PORK) LOCK FLUSH 100 UNIT/ML IV SOLN
INTRAVENOUS | Status: AC
  Filled 2023-09-14: qty 5

## 2023-09-14 NOTE — Progress Notes (Signed)
 Bryan Fleming presented for Portacath access and flush.  Portacath located right chest wall accessed with  H 20 needle.  Good blood return present. Patient remains accessed for CT scan.

## 2023-09-15 ENCOUNTER — Other Ambulatory Visit: Payer: Self-pay

## 2023-09-15 ENCOUNTER — Ambulatory Visit
Admission: RE | Admit: 2023-09-15 | Discharge: 2023-09-15 | Disposition: A | Discharge status: Home / Self Care | Payer: Medicare HMO | Source: Ambulatory Visit | Attending: Radiation Oncology | Admitting: Radiation Oncology

## 2023-09-15 DIAGNOSIS — C051 Malignant neoplasm of soft palate: Secondary | ICD-10-CM | POA: Diagnosis not present

## 2023-09-15 DIAGNOSIS — G893 Neoplasm related pain (acute) (chronic): Secondary | ICD-10-CM | POA: Diagnosis not present

## 2023-09-15 DIAGNOSIS — C328 Malignant neoplasm of overlapping sites of larynx: Secondary | ICD-10-CM

## 2023-09-15 DIAGNOSIS — Z510 Encounter for antineoplastic radiation therapy: Secondary | ICD-10-CM | POA: Diagnosis not present

## 2023-09-15 DIAGNOSIS — C321 Malignant neoplasm of supraglottis: Secondary | ICD-10-CM | POA: Diagnosis not present

## 2023-09-15 LAB — RAD ONC ARIA SESSION SUMMARY
Course Elapsed Days: 46
Plan Fractions Treated to Date: 33
Plan Prescribed Dose Per Fraction: 2 Gy
Plan Total Fractions Prescribed: 35
Plan Total Prescribed Dose: 70 Gy
Reference Point Dosage Given to Date: 66 Gy
Reference Point Session Dosage Given: 2 Gy
Session Number: 33

## 2023-09-15 MED ORDER — LIDOCAINE VISCOUS HCL 2 % MT SOLN
15.0000 mL | Freq: Once | OROMUCOSAL | Status: AC
  Administered 2023-09-15: 15 mL via OROMUCOSAL
  Filled 2023-09-15: qty 15

## 2023-09-16 ENCOUNTER — Encounter (HOSPITAL_COMMUNITY): Payer: Self-pay

## 2023-09-16 ENCOUNTER — Emergency Department (HOSPITAL_COMMUNITY)
Admission: EM | Admit: 2023-09-16 | Discharge: 2023-09-16 | Disposition: A | Discharge status: Home / Self Care | Payer: Medicare HMO | Attending: Emergency Medicine | Admitting: Emergency Medicine

## 2023-09-16 ENCOUNTER — Other Ambulatory Visit: Payer: Self-pay

## 2023-09-16 ENCOUNTER — Emergency Department (HOSPITAL_COMMUNITY): Payer: Medicare HMO

## 2023-09-16 DIAGNOSIS — K2980 Duodenitis without bleeding: Secondary | ICD-10-CM | POA: Diagnosis not present

## 2023-09-16 DIAGNOSIS — K8689 Other specified diseases of pancreas: Secondary | ICD-10-CM | POA: Insufficient documentation

## 2023-09-16 DIAGNOSIS — K5641 Fecal impaction: Secondary | ICD-10-CM | POA: Insufficient documentation

## 2023-09-16 DIAGNOSIS — R000 Tachycardia, unspecified: Secondary | ICD-10-CM | POA: Diagnosis not present

## 2023-09-16 DIAGNOSIS — R109 Unspecified abdominal pain: Secondary | ICD-10-CM | POA: Diagnosis present

## 2023-09-16 DIAGNOSIS — Z20822 Contact with and (suspected) exposure to covid-19: Secondary | ICD-10-CM | POA: Insufficient documentation

## 2023-09-16 DIAGNOSIS — C140 Malignant neoplasm of pharynx, unspecified: Secondary | ICD-10-CM | POA: Insufficient documentation

## 2023-09-16 DIAGNOSIS — K8020 Calculus of gallbladder without cholecystitis without obstruction: Secondary | ICD-10-CM | POA: Insufficient documentation

## 2023-09-16 DIAGNOSIS — Z87891 Personal history of nicotine dependence: Secondary | ICD-10-CM | POA: Diagnosis not present

## 2023-09-16 DIAGNOSIS — I700 Atherosclerosis of aorta: Secondary | ICD-10-CM | POA: Insufficient documentation

## 2023-09-16 DIAGNOSIS — K2900 Acute gastritis without bleeding: Secondary | ICD-10-CM | POA: Diagnosis not present

## 2023-09-16 DIAGNOSIS — Z931 Gastrostomy status: Secondary | ICD-10-CM | POA: Insufficient documentation

## 2023-09-16 DIAGNOSIS — C3490 Malignant neoplasm of unspecified part of unspecified bronchus or lung: Secondary | ICD-10-CM | POA: Insufficient documentation

## 2023-09-16 DIAGNOSIS — C3412 Malignant neoplasm of upper lobe, left bronchus or lung: Secondary | ICD-10-CM | POA: Diagnosis not present

## 2023-09-16 DIAGNOSIS — K5900 Constipation, unspecified: Secondary | ICD-10-CM | POA: Diagnosis not present

## 2023-09-16 DIAGNOSIS — J449 Chronic obstructive pulmonary disease, unspecified: Secondary | ICD-10-CM | POA: Insufficient documentation

## 2023-09-16 DIAGNOSIS — Z7951 Long term (current) use of inhaled steroids: Secondary | ICD-10-CM | POA: Insufficient documentation

## 2023-09-16 DIAGNOSIS — Z8521 Personal history of malignant neoplasm of larynx: Secondary | ICD-10-CM | POA: Insufficient documentation

## 2023-09-16 LAB — COMPREHENSIVE METABOLIC PANEL
ALT: 10 U/L (ref 0–44)
AST: 16 U/L (ref 15–41)
Albumin: 3.1 g/dL — ABNORMAL LOW (ref 3.5–5.0)
Alkaline Phosphatase: 60 U/L (ref 38–126)
Anion gap: 9 (ref 5–15)
BUN: 14 mg/dL (ref 8–23)
CO2: 25 mmol/L (ref 22–32)
Calcium: 8.8 mg/dL — ABNORMAL LOW (ref 8.9–10.3)
Chloride: 97 mmol/L — ABNORMAL LOW (ref 98–111)
Creatinine, Ser: 0.53 mg/dL — ABNORMAL LOW (ref 0.61–1.24)
GFR, Estimated: >60 mL/min (ref >60)
Glucose, Bld: 135 mg/dL — ABNORMAL HIGH (ref 70–99)
Potassium: 4.1 mmol/L (ref 3.5–5.1)
Sodium: 131 mmol/L — ABNORMAL LOW (ref 135–145)
Total Bilirubin: 0.2 mg/dL (ref 0.0–1.2)
Total Protein: 6.7 g/dL (ref 6.5–8.1)

## 2023-09-16 LAB — URINALYSIS, ROUTINE W REFLEX MICROSCOPIC
Bilirubin Urine: NEGATIVE
Glucose, UA: NEGATIVE mg/dL
Hgb urine dipstick: NEGATIVE
Ketones, ur: NEGATIVE mg/dL
Leukocytes,Ua: NEGATIVE
Nitrite: NEGATIVE
Protein, ur: NEGATIVE mg/dL
Specific Gravity, Urine: >1.046 — ABNORMAL HIGH (ref 1.005–1.030)
pH: 5 (ref 5.0–8.0)

## 2023-09-16 LAB — CBC
HCT: 31.1 % — ABNORMAL LOW (ref 39.0–52.0)
Hemoglobin: 10.5 g/dL — ABNORMAL LOW (ref 13.0–17.0)
MCH: 33 pg (ref 26.0–34.0)
MCHC: 33.8 g/dL (ref 30.0–36.0)
MCV: 97.8 fL (ref 80.0–100.0)
Platelets: 246 10*3/uL (ref 150–400)
RBC: 3.18 MIL/uL — ABNORMAL LOW (ref 4.22–5.81)
RDW: 14.1 % (ref 11.5–15.5)
WBC: 7 10*3/uL (ref 4.0–10.5)
nRBC: 0 % (ref 0.0–0.2)

## 2023-09-16 LAB — LIPASE, BLOOD: Lipase: 25 U/L (ref 11–51)

## 2023-09-16 LAB — RESP PANEL BY RT-PCR (RSV, FLU A&B, COVID)  RVPGX2
Influenza A by PCR: NEGATIVE
Influenza B by PCR: NEGATIVE
Resp Syncytial Virus by PCR: NEGATIVE
SARS Coronavirus 2 by RT PCR: NEGATIVE

## 2023-09-16 MED ORDER — MAGNESIUM CITRATE PO SOLN
1.0000 | Freq: Once | ORAL | Status: AC
  Administered 2023-09-16: 1 via ORAL
  Filled 2023-09-16 (×2): qty 296

## 2023-09-16 MED ORDER — MORPHINE SULFATE (PF) 4 MG/ML IV SOLN
4.0000 mg | Freq: Once | INTRAVENOUS | Status: AC
Start: 2023-09-16 — End: 2023-09-16
  Administered 2023-09-16: 4 mg via INTRAVENOUS
  Filled 2023-09-16: qty 1

## 2023-09-16 MED ORDER — IOHEXOL 300 MG/ML  SOLN
100.0000 mL | Freq: Once | INTRAMUSCULAR | Status: AC | PRN
  Administered 2023-09-16: 100 mL via INTRAVENOUS

## 2023-09-16 MED ORDER — ONDANSETRON HCL 4 MG/2ML IJ SOLN: 4.0000 mg | Freq: Once | INTRAMUSCULAR | Status: DC | PRN

## 2023-09-16 MED ORDER — FLEET ENEMA RE ENEM: 1.0000 | ENEMA | Freq: Once | RECTAL | Status: DC

## 2023-09-16 MED ORDER — POLYETHYLENE GLYCOL 3350 17 GM/SCOOP PO POWD: 17.0000 g | Freq: Three times a day (TID) | ORAL | 0 refills | Status: AC

## 2023-09-16 MED ORDER — SODIUM CHLORIDE 0.9 % IV BOLUS
1000.0000 mL | Freq: Once | INTRAVENOUS | Status: AC
Start: 2023-09-16 — End: 2023-09-16
  Administered 2023-09-16: 1000 mL via INTRAVENOUS

## 2023-09-16 MED ORDER — AMOXICILLIN-POT CLAVULANATE 875-125 MG PO TABS: 1.0000 | ORAL_TABLET | Freq: Two times a day (BID) | ORAL | 0 refills | Status: AC

## 2023-09-16 MED ORDER — SUCRALFATE 1 G PO TABS: 1.0000 g | ORAL_TABLET | Freq: Three times a day (TID) | ORAL | 0 refills | Status: AC

## 2023-09-16 MED ORDER — PANTOPRAZOLE SODIUM 40 MG IV SOLR
40.0000 mg | Freq: Once | INTRAVENOUS | Status: AC
  Administered 2023-09-16: 40 mg via INTRAVENOUS
  Filled 2023-09-16: qty 10

## 2023-09-16 MED ORDER — PANTOPRAZOLE SODIUM 20 MG PO TBEC: 20.0000 mg | DELAYED_RELEASE_TABLET | Freq: Two times a day (BID) | ORAL | 0 refills | Status: AC

## 2023-09-16 NOTE — ED Triage Notes (Signed)
 Pt arrived EMS for c/o lower abd pain since yesterday . Pt stated he feels constipated and last bm was 2 days ago. Pt has had some nausea off and on since chemo treatments. Chemo card presented and specified no rectal temps. Pt has wheezing noted and is getting radiation and chemo for throat and lung cancer.

## 2023-09-16 NOTE — Discharge Instructions (Addendum)
 Please follow up with your primary care doctor within 2-3 days.   Please take MiraLAX  1-2 capfuls 3 times a day until stools become soft and then slowly decrease the amount of MiraLAX  used.  You likely need to be on a daily stool softener/laxative as you take a narcotic pain medication. You can take one scoop of MiraLAX  daily along with increasing your water  intake to help avoid constipation.  Maintain fluid intake 6-8 glasses per day.   You may also take Milk of Magnesia 30 mL as needed for constipation, you may repeat in 2 hours again if no bowl movement.     Please call your gastroenterologist for follow-up in the next 5 to 7 day.     It was a pleasure caring for you today in the emergency department.  Please return to the emergency department for any worsening or worrisome symptoms including but not limited to: abdominal pain, vomiting, blood in your stool/vomit, fever, etc.

## 2023-09-16 NOTE — ED Notes (Signed)
 Pt attempted to get urine sample but was not successful.

## 2023-09-16 NOTE — ED Provider Notes (Signed)
 [name]  CSN: [id number] Arrival date & time: 09/16/23 1417  Chief Complaint(s) chemo card and Abdominal Pain  HPI Bryan Fleming is a 63 y.o. male with past medical history as below, significant for lung and throat cancer, COPD, depression, dyspnea, GERD, alcoholic otitis, PEG tube, Port-A-Cath who presents to the ED with complaint of abd pain, constipation  Ongoing the past 3 to 4 days.  Last vomiting was 3 days ago.,  Abdominal pain, nausea no vomiting.  He tolerated some p.o. but most of his nutrition is through PEG tube.  No prior abdominal surgeries.  Does have prior lung and GU surgery.  No fevers, chills, nausea or vomiting, he is on, chronic narcotics at home.  Past Medical History Past Medical History:  Diagnosis Date   Cancer [facility])    Lung cancer   COPD (chronic obstructive pulmonary disease) (HCC) 2007   EMPHYSEMA   Depression    Dyspnea    occasional - no oxygen   GERD (gastroesophageal reflux disease) 2010   Hepatitis    Alcoholic    HLD (hyperlipidemia)    no meds   Hypertension    Port-A-Cath in place 01/01/2021   Patient Active Problem List   Diagnosis Date Noted   Hypomagnesemia 09/08/2023   Leukopenia due to antineoplastic chemotherapy (HCC) 09/01/2023   Chemotherapy-induced thrombocytopenia 09/01/2023   Flush 07/28/2023   Cancer associated pain 07/24/2023   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) 07/19/2023   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   Lesion of larynx 07/04/2023   Dilation of pancreatic duct 05/15/2023   Multiple gastric ulcers 05/15/2023   Gastritis and gastroduodenitis 05/15/2023   Wound dehiscence 12/01/2022   Malfunction of penile prosthesis (HCC) 10/13/2022   Erectile dysfunction 02/17/2022   Lung mass 04/21/2021   S/P Left Video Assisted Thorascopy with Left Upper Lobectomy, Lypmh Node Dissection, Intercostal Nerve block 04/21/2021    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   Joint pain 02/03/2021   Nausea without vomiting 02/03/2021   Primary lung squamous cell carcinoma, left (HCC) 01/01/2021   Port-A-Cath in place 01/01/2021   Cavitating mass in left upper lung lobe 12/17/2020   Acute pancreatitis 06/04/2020   Leukocytosis 06/04/2020   Hypokalemia 06/04/2020   Elevated liver function tests 02/02/2016   Absolute anemia 02/02/2016   Alcohol  abuse 08/30/2015   Situational anxiety 08/30/2015   Nicotine  dependence, cigarettes, with other nicotine -induced disorders 08/30/2015   Hepatitis 08/31/2011   Hypertension    GERD (gastroesophageal reflux disease)    COPD (chronic obstructive pulmonary disease) (HCC)    Home Medication(s) Prior to Admission medications   Medication Sig Start Date End Date Taking? Authorizing Provider  amoxicillin -clavulanate (AUGMENTIN ) 875-125 MG tablet Take 1 tablet by mouth every 12 (twelve) hours. 09/16/23  Yes Elnor Savant A, DO  pantoprazole  (PROTONIX ) 20 MG tablet Take 1 tablet (20 mg total) by mouth 2 (two) times daily for 14 days. 09/16/23 09/30/23 Yes Elnor Savant A, DO  polyethylene glycol powder (MIRALAX ) 17 GM/SCOOP powder Take 17 g by mouth 3 (three) times daily for 5 days. 09/16/23 09/21/23 Yes Elnor Savant A, DO  sucralfate  (CARAFATE ) 1 g tablet Take 1 tablet (1 g total) by mouth with breakfast, with lunch, and with evening meal for 7 days. 09/16/23 09/23/23 Yes Elnor Savant LABOR, DO  acetaminophen  (TYLENOL ) 500 MG tablet Take 2 tablets (1,000 mg total) by mouth every 6 (six) hours. 12/06/22  Lovie Arlyss CROME, MD  albuterol  (VENTOLIN  HFA) 108 (90 Base) MCG/ACT inhaler Inhale 1-2 puffs into the lungs every 6 (six) hours as needed for wheezing or shortness of breath. Patient not taking: Reported on 08/17/2023    [provider]  ALPRAZolam  (XANAX ) 0.5 MG tablet Take 1 tablet (0.5 mg total) by mouth at bedtime as needed for anxiety. 08/17/23   [name], Chinita, MD  amLODipine   (NORVASC ) 5 MG tablet Take 5 mg by mouth in the morning. 04/16/20   [provider]  dexamethasone  (DECADRON ) 4 MG tablet Take 2 tablets (8 mg) by mouth daily x 3 days starting the day after cisplatin  chemotherapy. Take with food. 07/21/23   [name], Chinita, MD  fluconazole  (DIFLUCAN ) 100 MG tablet Take 2 tablets today, then 1 tablet daily x 20 more days. 08/21/23   Izell Domino, MD  Fluticasone -Umeclidin-Vilant (TRELEGY ELLIPTA) 100-62.5-25 MCG/INH AEPB Inhale 1 puff into the lungs in the morning.    [provider]  lidocaine  (XYLOCAINE ) 2 % solution Patient: Mix 1part 2% viscous lidocaine , 1part H20. Swish & swallow 10mL of diluted mixture, before meals and at bedtime, up to QID 09/07/23   [name], [name], MD  lidocaine -prilocaine  (EMLA ) cream Apply to affected area once Patient not taking: Reported on 09/01/2023 07/21/23   [name], Chinita, MD  loratadine (CLARITIN) 10 MG tablet Take 10 mg by mouth daily. Patient not taking: Reported on 08/25/2023 07/11/23   [provider]  magnesium  oxide (MAG-OX) 400 (240 Mg) MG tablet Take 1 tablet (400 mg total) by mouth daily. 09/08/23   [name], Chinita, MD  metoprolol  tartrate (LOPRESSOR ) 50 MG tablet Take 50 mg by mouth 2 (two) times daily. 05/12/23   [provider]  Multiple Vitamin (MULTIVITAMIN WITH MINERALS) TABS tablet Take 1 tablet by mouth in the morning. Patient not taking: Reported on 08/11/2023    [provider]  Nutritional Supplements (FEEDING SUPPLEMENT, OSMOLITE 1.5 CAL,) LIQD Give 2 cartons at 8am and noon via feeding tube.  Give 1 carton at 4pm and 8pm.  Flush with 60ml of water  before and after each feeding.  Give additional 3 cups of water  via tube for hydration if unable to drink orally. Patient not taking: Reported on 09/01/2023 08/21/23   Izell Domino, MD  ondansetron  (ZOFRAN ) 8 MG tablet Take 1 tablet (8 mg total) by mouth every 8 (eight) hours as needed for nausea or vomiting. Start on the third  day after cisplatin . 07/21/23   [name], [name], MD  Oxycodone  HCl 20 MG TABS Take 1 tablet (20 mg total) by mouth every 6 (six) hours as needed. 09/08/23   [name], Chinita, MD  potassium chloride  SA (KLOR-CON  M) 20 MEQ tablet Take 1 tablet (20 mEq total) by mouth 2 (two) times daily. 08/03/23   [name], Chinita, MD  prochlorperazine  (COMPAZINE ) 10 MG tablet Take 1 tablet (10 mg total) by mouth every 6 (six) hours as needed (Nausea or vomiting). 07/21/23   [name], Chinita, MD  sodium fluoride  (FLUORISHIELD) 1.1 % GEL dental gel Place 1 Application onto teeth at bedtime. 07/13/23   [provider]  Past Surgical History Past Surgical History:  Procedure Laterality Date   BIOPSY  05/15/2023   Procedure: BIOPSY;  Surgeon: Wilhelmenia Aloha Raddle., MD;  Location: [facility];  Service: Gastroenterology;;   BRONCHIAL BIOPSY  12/23/2020   Procedure: BRONCHIAL BIOPSIES;  Surgeon: Shelah Lamar RAMAN, MD;  Location: [facility] ENDOSCOPY;  Service: Pulmonary;;   BRONCHIAL BRUSHINGS  12/23/2020   Procedure: BRONCHIAL BRUSHINGS;  Surgeon: Shelah Lamar RAMAN, MD;  Location: [facility] ENDOSCOPY;  Service: Pulmonary;;   BRONCHIAL NEEDLE ASPIRATION BIOPSY  12/23/2020   Procedure: BRONCHIAL NEEDLE ASPIRATION BIOPSIES;  Surgeon: Shelah Lamar RAMAN, MD;  Location: [facility] ENDOSCOPY;  Service: Pulmonary;;   BRONCHIAL WASHINGS  12/23/2020   Procedure: BRONCHIAL WASHINGS;  Surgeon: Shelah Lamar RAMAN, MD;  Location: [facility] ENDOSCOPY;  Service: Pulmonary;;   CARDIAC CATHETERIZATION  2010   COLONOSCOPY  03/14/2012   Procedure: COLONOSCOPY;  Surgeon: Claudis RAYMOND Rivet, MD;  Location: [facility];  Service: Endoscopy;  Laterality: N/A;  100   ESOPHAGOGASTRODUODENOSCOPY (EGD) WITH PROPOFOL  N/A 05/15/2023   Procedure: ESOPHAGOGASTRODUODENOSCOPY (EGD) WITH PROPOFOL ;  Surgeon: Wilhelmenia Aloha Raddle., MD;  Location: [facility]  ENDOSCOPY;  Service: Gastroenterology;  Laterality: N/A;   EUS N/A 05/15/2023   Procedure: UPPER ENDOSCOPIC ULTRASOUND (EUS) RADIAL;  Surgeon: Wilhelmenia Aloha Raddle., MD;  Location: [facility];  Service: Gastroenterology;  Laterality: N/A;   INTERCOSTAL NERVE BLOCK  04/21/2021   Procedure: INTERCOSTAL NERVE BLOCK;  Surgeon: Kerrin Elspeth BROCKS, MD;  Location: [facility] OR;  Service: Thoracic;;   IR GASTROSTOMY TUBE MOD SED  07/27/2023   IR IMAGING GUIDED PORT INSERTION  01/12/2021   LARYNGOSCOPY N/A 07/04/2023   Procedure: LARYNGOSCOPY WITH BIOPSIES;  Surgeon: Llewellyn Gerard LABOR, DO;  Location: [facility];  Service: ENT;  Laterality: N/A;   LUNG REMOVAL, PARTIAL  08/2021   PENILE PROSTHESIS IMPLANT N/A 02/17/2022   Procedure: PENILE PROTHESIS INFLATABLE;  Surgeon: Sherrilee Belvie CROME, MD;  Location: [facility];  Service: Urology;  Laterality: N/A;   PORTACATH PLACEMENT  07/2021   REMOVAL OF PENILE PROSTHESIS N/A 10/13/2022   Procedure: REMOVAL OF PENILE PROSTHESIS;  Surgeon: Sherrilee Belvie CROME, MD;  Location: [facility];  Service: Urology;  Laterality: N/A;   REMOVAL OF PENILE PROSTHESIS N/A 12/05/2022   Procedure: REMOVAL AND REPLACEMENT OF PENILE PROSTHESIS;  Surgeon: Lovie Arlyss CROME, MD;  Location: [facility];  Service: Urology;  Laterality: N/A;  120 MINUTES NEEDED FOR CASE   VIDEO ASSISTED THORACOSCOPY (VATS)/ LOBECTOMY Left 04/21/2021   Procedure: VIDEO ASSISTED THORACOSCOPY (VATS)/ LOBECTOMY;  Surgeon: Kerrin Elspeth BROCKS, MD;  Location: [facility] OR;  Service: Thoracic;  Laterality: Left;   VIDEO BRONCHOSCOPY WITH ENDOBRONCHIAL NAVIGATION N/A 12/23/2020   Procedure: VIDEO BRONCHOSCOPY WITH ENDOBRONCHIAL NAVIGATION;  Surgeon: Shelah Lamar RAMAN, MD;  Location: [facility] ENDOSCOPY;  Service: Pulmonary;  Laterality: N/A;   WRIST GANGLION EXCISION  1985   Family History Family History  Problem Relation Age of Onset   Hypertension Mother    Asthma Father    Diabetes Sister    Hypertension Sister     Hypertension Brother    Hypertension Sister    Lupus Daughter     Social History Social History   Tobacco Use   Smoking status: Every Day    Current packs/day: 1.00    Average packs/day: 1 pack/day for 37.0 years (37.0 ttl pk-yrs)    Types: Cigarettes   Smokeless tobacco: Never   Tobacco comments:    smokes 1/2 pack a day [facility] 03/08/21  Vaping Use   Vaping status:  Never Used  Substance Use Topics   Alcohol  use: Not Currently    Alcohol /week: 56.0 standard drinks of alcohol     Types: 35 Cans of beer, 21 Shots of liquor per week    Comment: 5-6 beers and 2-3 shots daily-04/19/21   Drug use: No   Allergies Patient has no known allergies.  Review of Systems Review of Systems  Constitutional:  Negative for chills and fever.  Respiratory:  Negative for cough and shortness of breath.   Cardiovascular:  Negative for chest pain.  Gastrointestinal:  Positive for abdominal pain, constipation and nausea. Negative for vomiting.  Genitourinary:  Negative for dysuria.  Musculoskeletal:  Negative for arthralgias.  All other systems reviewed and are negative.   Physical Exam Vital Signs  I have reviewed the triage vital signs BP 132/73   Pulse (!) 110   Temp 98.4 F (36.9 C) (Oral)   Resp 14   Ht 5' 9 (1.753 m)   Wt 62.6 kg   SpO2 92%   BMI 20.38 kg/m  Physical Exam Vitals and nursing note reviewed.  Constitutional:      General: He is not in acute distress.    Appearance: He is well-developed.  HENT:     Head: Normocephalic and atraumatic.     Right Ear: External ear normal.     Left Ear: External ear normal.     Mouth/Throat:     Mouth: Mucous membranes are moist.  Eyes:     General: No scleral icterus. Cardiovascular:     Rate and Rhythm: Regular rhythm. Tachycardia present.     Pulses: Normal pulses.     Heart sounds: Normal heart sounds.  Pulmonary:     Effort: Pulmonary effort is normal. No respiratory distress.     Breath sounds: Normal breath sounds.   Abdominal:     General: Abdomen is flat.     Palpations: Abdomen is soft.     Tenderness: There is no abdominal tenderness.    Musculoskeletal:     Cervical back: No rigidity.     Right lower leg: No edema.     Left lower leg: No edema.  Skin:    General: Skin is warm and dry.     Capillary Refill: Capillary refill takes less than 2 seconds.  Neurological:     Mental Status: He is alert.  Psychiatric:        Mood and Affect: Mood normal.        Behavior: Behavior normal.     ED Results and Treatments Labs (all labs ordered are listed, but only abnormal results are displayed) Labs Reviewed  COMPREHENSIVE METABOLIC PANEL - Abnormal; Notable for the following components:      Result Value   Sodium 131 (*)    Chloride 97 (*)    Glucose, Bld 135 (*)    Creatinine, Ser 0.53 (*)    Calcium 8.8 (*)    Albumin 3.1 (*)    All other components within normal limits  CBC - Abnormal; Notable for the following components:   RBC 3.18 (*)    Hemoglobin 10.5 (*)    HCT 31.1 (*)    All other components within normal limits  URINALYSIS, ROUTINE W REFLEX MICROSCOPIC - Abnormal; Notable for the following components:   Specific Gravity, Urine >1.046 (*)    All other components within normal limits  RESP PANEL BY RT-PCR (RSV, FLU A&B, COVID)  RVPGX2  LIPASE, BLOOD  Radiology CT ABDOMEN PELVIS W CONTRAST Result Date: 09/16/2023 CLINICAL DATA:  Lower abdominal pain, constipation for 2 days, intermittent nausea, history of head and neck and left upper lobe lung cancer EXAM: CT ABDOMEN AND PELVIS WITH CONTRAST TECHNIQUE: Multidetector CT imaging of the abdomen and pelvis was performed using the standard protocol following bolus administration of intravenous contrast. RADIATION DOSE REDUCTION: This exam was performed according to the departmental dose-optimization program which  includes automated exposure control, adjustment of the mA and/or kV according to patient size and/or use of iterative reconstruction technique. CONTRAST:  OMNIPAQUE  IOHEXOL  300 MG/ML  SOLN COMPARISON:  11/02/2022, 07/13/2023 FINDINGS: Lower chest: No acute pleural or parenchymal lung disease. Hepatobiliary: Noncalcified gallstones or tumefactive sludge again noted within the gallbladder lumen. No evidence of gallbladder wall thickening. Liver is unremarkable. No biliary duct dilation. Pancreas: Normal enhancement of the pancreatic parenchyma. No inflammatory changes. 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 Spleen: Normal in size without focal abnormality. Adrenals/Urinary Tract: Adrenal glands are unremarkable. Kidneys are normal, without renal calculi, focal lesion, or hydronephrosis. Bladder is unremarkable. Stomach/Bowel: There is wall thickening of the gastric antrum and first portion duodenum, with inflammatory changes and free fluid extending to the porta hepatis. Findings are consistent with peptic ulcer disease or duodenitis. No evidence of perforation. Percutaneous gastrostomy tube is identified within the gastric lumen. No bowel obstruction or ileus. Normal appendix right lower quadrant. There is a large amount of retained stool within the rectal vault, with mild rectal wall thickening and perirectal fat stranding, compatible with fecal impaction and stercoral colitis. Vascular/Lymphatic: Extensive atherosclerosis throughout the aorta and iliac vessels, unchanged since prior exams. No pathologic adenopathy within the abdomen or pelvis. Reproductive: Stable appearance of the prostate. No acute findings. Penile prosthesis again noted. Reservoir within the right hemipelvis. Other: Small amount of free fluid within the right upper quadrant adjacent to the gastric antral and duodenal wall thickening as above. No free intraperitoneal gas. No abdominal wall hernia.  Musculoskeletal: No acute or destructive bony abnormalities. Reconstructed images demonstrate no additional findings. IMPRESSION: 1. Wall thickening of the gastric antrum and first portion duodenum, with inflammatory changes and trace free fluid extending to the porta hepatis. Findings are consistent with gastritis/duodenitis or peptic ulcer disease. 2. Large amount of retained stool within the rectum consistent with fecal impaction. Associated rectal wall thickening and perirectal fat stranding compatible with stercoral colitis. 3. Cholelithiasis without evidence of acute cholecystitis. 4.  Aortic Atherosclerosis (ICD10-I70.0). Electronically Signed   By: Ozell Daring M.D.   On: 09/16/2023 16:58    Pertinent labs & imaging results that were available during my care of the patient were reviewed by me and considered in my medical decision making (see MDM for details).  Medications Ordered in ED Medications  ondansetron  (ZOFRAN ) injection 4 mg (has no administration in time range)  magnesium  citrate solution 1 Bottle (has no administration in time range)  sodium chloride  0.9 % bolus 1,000 mL (0 mLs Intravenous Stopped 09/16/23 1740)  morphine  (PF) 4 MG/ML injection 4 mg (4 mg Intravenous Given 09/16/23 1717)  iohexol  (OMNIPAQUE ) 300 MG/ML solution 100 mL (100 mLs Intravenous Contrast Given 09/16/23 1631)  pantoprazole  (PROTONIX ) injection 40 mg (40 mg Intravenous Given 09/16/23 1812)  Procedures Procedures  (including critical care time)  Medical Decision Making / ED Course    Medical Decision Making:    JAJUAN SKOOG is a 63 y.o. male  with past medical history as below, significant for lung and throat cancer, COPD, depression, dyspnea, GERD, alcoholic otitis, PEG tube, Port-A-Cath who presents to the ED with complaint of abd pain, constipation. The complaint  involves an extensive differential diagnosis and also carries with it a high risk of complications and morbidity.  Serious etiology was considered. Ddx includes but is not limited to: Differential diagnosis includes but is not exclusive to acute appendicitis, renal colic, testicular torsion, urinary tract infection, prostatitis,  diverticulitis, small bowel obstruction, colitis, abdominal aortic aneurysm, gastroenteritis, constipation etc.   Complete initial physical exam performed, notably the patient was in no distress, tachycardia noted.    Reviewed and confirmed nursing documentation for past medical history, family history, social history.  Vital signs reviewed.    Clinical Course as of 09/16/23 1905  Sat Sep 16, 2023  1758 Patient has constipation, possible fecal impaction on CT.  He has been told to not receive rectal temperatures.,  He does not want enema or manual disimpaction due to his restriction associated with chemotherapy.  Patient is on chronic narcotic therapy, this is likely provoking factor in his constipation.  No evidence of bowel obstruction.  Will start bowel regimen.  Start PPI, follow-up with GI for further evaluation, endoscopy evaluation.  Possible PUD [SG]    Clinical Course User Index [SG] Elnor Jayson LABOR, DO    Brief summary: 63 year old with history as above including lung and throat cancer current chemotherapy here with abdominal pain, nausea, constipation.  He is on chronic narcotics.  Tachycardia noted in triage.  Will collect screening labs, get CT imaging of his abdomen pelvis, IV fluids, analgesia and antiemetic.  Received 10 mg of morphine  and 4 mg of Zofran  with EMS.  Labs reviewed, these are stable, similar to his baseline.  CT imaging revealing for gastritis and constipation, possible stercoral colitis.  Discussed treatment of his constipation with the patient at bedside, recommended enema versus digital disimpaction.  He reports that he cannot have any  foreign object into his rectum due to his instructions from his oncologist.  Discussed the patient that digital disimpaction is generally safe and well-tolerated in the circumstances but he does not want this.  Will have him try laxative, encouraged him to take MiraLAX  at home, bowel regimen at home.  Increase water  intake.  No evidence of perforation on imaging.  Will cover him with empiric antibiotics given his significant medical history.  Constipation is likely secondary to chronic narcotic use.  Also resume PPI, he self discontinued this.  Advised him to follow-up with gastroenterology for further evaluation of his possible gastritis or PUD. He has some pain to epigastrium, likely a/w gastritis/?PUD. No evidence of bleeding or perforation.   Abdominal pain instructions for home.  The patient improved significantly and was discharged in stable condition. Detailed discussions were had with the patient/guardian regarding current findings, and need for close f/u with PCP or on call doctor. The patient/guardian has been instructed to return immediately if the symptoms worsen in any way for re-evaluation. Patient/guardian verbalized understanding and is in agreement with current care plan. All questions answered prior to discharge.           Additional history obtained: -Additional history obtained from family -External records from outside source obtained and reviewed including: Chart review including previous  notes, labs, imaging, consultation notes including  Primary care documentation, prior labs and imaging Home medications   Lab Tests: -I ordered, reviewed, and interpreted labs.   The pertinent results include:   Labs Reviewed  COMPREHENSIVE METABOLIC PANEL - Abnormal; Notable for the following components:      Result Value   Sodium 131 (*)    Chloride 97 (*)    Glucose, Bld 135 (*)    Creatinine, Ser 0.53 (*)    Calcium 8.8 (*)    Albumin 3.1 (*)    All other components  within normal limits  CBC - Abnormal; Notable for the following components:   RBC 3.18 (*)    Hemoglobin 10.5 (*)    HCT 31.1 (*)    All other components within normal limits  URINALYSIS, ROUTINE W REFLEX MICROSCOPIC - Abnormal; Notable for the following components:   Specific Gravity, Urine >1.046 (*)    All other components within normal limits  RESP PANEL BY RT-PCR (RSV, FLU A&B, COVID)  RVPGX2  LIPASE, BLOOD    Notable for labs stable  EKG   EKG Interpretation Date/Time:    Ventricular Rate:    PR Interval:    QRS Duration:    QT Interval:    QTC Calculation:   R Axis:      Text Interpretation:           Imaging Studies ordered: I ordered imaging studies including CTAP I independently visualized the following imaging with scope of interpretation limited to determining acute life threatening conditions related to emergency care; findings noted above I independently visualized and interpreted imaging. I agree with the radiologist interpretation   Medicines ordered and prescription drug management: Meds ordered this encounter  Medications   ondansetron  (ZOFRAN ) injection 4 mg   sodium chloride  0.9 % bolus 1,000 mL   morphine  (PF) 4 MG/ML injection 4 mg   iohexol  (OMNIPAQUE ) 300 MG/ML solution 100 mL   DISCONTD: sodium phosphate  (FLEET) enema 1 enema   pantoprazole  (PROTONIX ) injection 40 mg   magnesium  citrate solution 1 Bottle   pantoprazole  (PROTONIX ) 20 MG tablet    Sig: Take 1 tablet (20 mg total) by mouth 2 (two) times daily for 14 days.    Dispense:  28 tablet    Refill:  0   sucralfate  (CARAFATE ) 1 g tablet    Sig: Take 1 tablet (1 g total) by mouth with breakfast, with lunch, and with evening meal for 7 days.    Dispense:  21 tablet    Refill:  0   polyethylene glycol powder (MIRALAX ) 17 GM/SCOOP powder    Sig: Take 17 g by mouth 3 (three) times daily for 5 days.    Dispense:  238 g    Refill:  0   amoxicillin -clavulanate (AUGMENTIN ) 875-125 MG  tablet    Sig: Take 1 tablet by mouth every 12 (twelve) hours.    Dispense:  14 tablet    Refill:  0    -I have reviewed the patients home medicines and have made adjustments as needed   Consultations Obtained: na   Cardiac Monitoring: Continuous pulse oximetry interpreted by myself, 95% on RA.    Social Determinants of Health:  Diagnosis or treatment significantly limited by social determinants of health: current smoker    Reevaluation: After the interventions noted above, I reevaluated the patient and found that they have improved  Co morbidities that complicate the patient evaluation  Past Medical History:  Diagnosis Date   Cancer (  HCC)    Lung cancer   COPD (chronic obstructive pulmonary disease) (HCC) 2007   EMPHYSEMA   Depression    Dyspnea    occasional - no oxygen   GERD (gastroesophageal reflux disease) 2010   Hepatitis    Alcoholic    HLD (hyperlipidemia)    no meds   Hypertension    Port-A-Cath in place 01/01/2021      Dispostion: Disposition decision including need for hospitalization was considered, and patient discharged from emergency department.    Final Clinical Impression(s) / ED Diagnoses Final diagnoses:  Abdominal pain, unspecified abdominal location  Constipation, unspecified constipation type  Acute gastritis without hemorrhage, unspecified gastritis type        Elnor Jayson LABOR, DO 09/16/23 1905

## 2023-09-17 DIAGNOSIS — C328 Malignant neoplasm of overlapping sites of larynx: Secondary | ICD-10-CM | POA: Diagnosis not present

## 2023-09-17 DIAGNOSIS — C051 Malignant neoplasm of soft palate: Secondary | ICD-10-CM | POA: Diagnosis not present

## 2023-09-17 DIAGNOSIS — C321 Malignant neoplasm of supraglottis: Secondary | ICD-10-CM | POA: Diagnosis not present

## 2023-09-18 ENCOUNTER — Other Ambulatory Visit: Payer: Self-pay

## 2023-09-18 ENCOUNTER — Ambulatory Visit
Admission: RE | Admit: 2023-09-18 | Discharge: 2023-09-18 | Disposition: A | Discharge status: Home / Self Care | Payer: Medicare HMO | Source: Ambulatory Visit | Attending: Radiation Oncology

## 2023-09-18 ENCOUNTER — Telehealth: Payer: Self-pay

## 2023-09-18 ENCOUNTER — Ambulatory Visit: Payer: Medicare HMO

## 2023-09-18 DIAGNOSIS — G893 Neoplasm related pain (acute) (chronic): Secondary | ICD-10-CM

## 2023-09-18 DIAGNOSIS — C328 Malignant neoplasm of overlapping sites of larynx: Secondary | ICD-10-CM | POA: Diagnosis not present

## 2023-09-18 DIAGNOSIS — C051 Malignant neoplasm of soft palate: Secondary | ICD-10-CM | POA: Diagnosis not present

## 2023-09-18 DIAGNOSIS — Z510 Encounter for antineoplastic radiation therapy: Secondary | ICD-10-CM | POA: Diagnosis not present

## 2023-09-18 DIAGNOSIS — C321 Malignant neoplasm of supraglottis: Secondary | ICD-10-CM | POA: Diagnosis not present

## 2023-09-18 DIAGNOSIS — J387 Other diseases of larynx: Secondary | ICD-10-CM

## 2023-09-18 LAB — RAD ONC ARIA SESSION SUMMARY
Course Elapsed Days: 49
Plan Fractions Treated to Date: 34
Plan Prescribed Dose Per Fraction: 2 Gy
Plan Total Fractions Prescribed: 35
Plan Total Prescribed Dose: 70 Gy
Reference Point Dosage Given to Date: 68 Gy
Reference Point Session Dosage Given: 2 Gy
Session Number: 34

## 2023-09-18 MED ORDER — LIDOCAINE VISCOUS HCL 2 % MT SOLN
15.0000 mL | Freq: Once | OROMUCOSAL | Status: AC
  Administered 2023-09-18: 15 mL via OROMUCOSAL
  Filled 2023-09-18: qty 15

## 2023-09-18 NOTE — Telephone Encounter (Signed)
 Mansouraty, Albino Alu., MD  Teddi Favors, DO; [name], MD; Aneita Keens, RN [facility], Thanks for sending this.  Teja Costen, Reach out to patient and see how he is doing in regards to moving his bowels. Agree with workup as per ED team. We can be more aggressive with a bowel regimen if needed. If pain becomes severe or he starts passing blood per rectum he needs to return to hospital. Hold on enema usage for now. We can consider 1/2 Colon prep or full Colon prep if needed. Schedule for a clinic visit with APP or myself, if needed. If bowels are moving, then can hold on scheduling a clinic visit. Start Colace 100 mg twice daily as well. Thanks. GM

## 2023-09-18 NOTE — Telephone Encounter (Signed)
 Attempted to reach pt - voice mail full will attempt later

## 2023-09-19 ENCOUNTER — Ambulatory Visit: Payer: Medicare HMO

## 2023-09-19 ENCOUNTER — Inpatient Hospital Stay: Payer: Medicare HMO

## 2023-09-19 ENCOUNTER — Other Ambulatory Visit: Payer: Self-pay

## 2023-09-19 ENCOUNTER — Ambulatory Visit
Admission: RE | Admit: 2023-09-19 | Discharge: 2023-09-19 | Disposition: A | Discharge status: Home / Self Care | Payer: Medicare HMO | Source: Ambulatory Visit | Attending: Radiation Oncology

## 2023-09-19 DIAGNOSIS — Z510 Encounter for antineoplastic radiation therapy: Secondary | ICD-10-CM | POA: Diagnosis not present

## 2023-09-19 DIAGNOSIS — C321 Malignant neoplasm of supraglottis: Secondary | ICD-10-CM

## 2023-09-19 DIAGNOSIS — C328 Malignant neoplasm of overlapping sites of larynx: Secondary | ICD-10-CM | POA: Diagnosis not present

## 2023-09-19 DIAGNOSIS — R232 Flushing: Secondary | ICD-10-CM

## 2023-09-19 DIAGNOSIS — Z5111 Encounter for antineoplastic chemotherapy: Secondary | ICD-10-CM | POA: Diagnosis not present

## 2023-09-19 DIAGNOSIS — G893 Neoplasm related pain (acute) (chronic): Secondary | ICD-10-CM | POA: Diagnosis not present

## 2023-09-19 DIAGNOSIS — C051 Malignant neoplasm of soft palate: Secondary | ICD-10-CM | POA: Diagnosis not present

## 2023-09-19 LAB — CBC WITH DIFFERENTIAL (CANCER CENTER ONLY)
Abs Immature Granulocytes: 0.04 10*3/uL (ref 0.00–0.07)
Basophils Absolute: 0 10*3/uL (ref 0.0–0.1)
Basophils Relative: 1 %
Eosinophils Absolute: 0 10*3/uL (ref 0.0–0.5)
Eosinophils Relative: 0 %
HCT: 29.2 % — ABNORMAL LOW (ref 39.0–52.0)
Hemoglobin: 9.9 g/dL — ABNORMAL LOW (ref 13.0–17.0)
Immature Granulocytes: 1 %
Lymphocytes Relative: 10 %
Lymphs Abs: 0.7 10*3/uL (ref 0.7–4.0)
MCH: 32.7 pg (ref 26.0–34.0)
MCHC: 33.9 g/dL (ref 30.0–36.0)
MCV: 96.4 fL (ref 80.0–100.0)
Monocytes Absolute: 1 10*3/uL (ref 0.1–1.0)
Monocytes Relative: 16 %
Neutro Abs: 4.7 10*3/uL (ref 1.7–7.7)
Neutrophils Relative %: 72 %
Platelet Count: 218 10*3/uL (ref 150–400)
RBC: 3.03 MIL/uL — ABNORMAL LOW (ref 4.22–5.81)
RDW: 14.5 % (ref 11.5–15.5)
WBC Count: 6.5 10*3/uL (ref 4.0–10.5)
nRBC: 0 % (ref 0.0–0.2)

## 2023-09-19 LAB — RAD ONC ARIA SESSION SUMMARY
Course Elapsed Days: 50
Plan Fractions Treated to Date: 35
Plan Prescribed Dose Per Fraction: 2 Gy
Plan Total Fractions Prescribed: 35
Plan Total Prescribed Dose: 70 Gy
Reference Point Dosage Given to Date: 70 Gy
Reference Point Session Dosage Given: 2 Gy
Session Number: 35

## 2023-09-19 LAB — BASIC METABOLIC PANEL - CANCER CENTER ONLY
Anion gap: 8 (ref 5–15)
BUN: 11 mg/dL (ref 8–23)
CO2: 29 mmol/L (ref 22–32)
Calcium: 9.2 mg/dL (ref 8.9–10.3)
Chloride: 97 mmol/L — ABNORMAL LOW (ref 98–111)
Creatinine: 0.56 mg/dL — ABNORMAL LOW (ref 0.61–1.24)
GFR, Estimated: >60 mL/min (ref >60)
Glucose, Bld: 131 mg/dL — ABNORMAL HIGH (ref 70–99)
Potassium: 3.9 mmol/L (ref 3.5–5.1)
Sodium: 134 mmol/L — ABNORMAL LOW (ref 135–145)

## 2023-09-19 MED ORDER — LIDOCAINE VISCOUS HCL 2 % MT SOLN
15.0000 mL | Freq: Once | OROMUCOSAL | Status: AC
Start: 2023-09-19 — End: 2023-09-19
  Administered 2023-09-19: 15 mL via OROMUCOSAL
  Filled 2023-09-19: qty 15

## 2023-09-19 MED ORDER — ONDANSETRON HCL 4 MG/2ML IJ SOLN
4.0000 mg | Freq: Once | INTRAMUSCULAR | Status: AC
  Administered 2023-09-19: 4 mg via INTRAVENOUS
  Filled 2023-09-19: qty 2

## 2023-09-19 MED ORDER — SODIUM CHLORIDE 0.9 % IV SOLN
INTRAVENOUS | Status: AC
Start: 2023-09-19 — End: 2023-09-19

## 2023-09-19 MED ORDER — SODIUM CHLORIDE 0.9% FLUSH
10.0000 mL | Freq: Once | INTRAVENOUS | Status: AC
Start: 2023-09-19 — End: 2023-09-19
  Administered 2023-09-19: 10 mL

## 2023-09-19 MED ORDER — SODIUM CHLORIDE 0.9% FLUSH
10.0000 mL | Freq: Once | INTRAVENOUS | Status: AC
  Administered 2023-09-19: 10 mL

## 2023-09-19 MED ORDER — OXYCODONE HCL 5 MG PO TABS
10.0000 mg | ORAL_TABLET | Freq: Once | ORAL | Status: AC
  Administered 2023-09-19: 10 mg via ORAL
  Filled 2023-09-19: qty 2

## 2023-09-19 MED ORDER — HEPARIN SOD (PORK) LOCK FLUSH 100 UNIT/ML IV SOLN
500.0000 [IU] | Freq: Once | INTRAVENOUS | Status: AC
  Administered 2023-09-19: 500 [IU]

## 2023-09-19 NOTE — Patient Instructions (Addendum)
Rehydration, Adult Rehydration is the replacement of fluids, salts, and minerals in the body (electrolytes) that are lost during dehydration. Dehydration is when there is not enough water or other fluids in the body. This happens when you lose more fluids than you take in. Common causes of dehydration include: Not drinking enough fluids. This can occur when you are ill or doing activities that require a lot of energy, especially in hot weather. Conditions that cause loss of water or other fluids. These include diarrhea, vomiting, sweating, and urinating a lot. Other illnesses, such as fever or infection. Certain medicines, such as those that remove excess fluid from the body (diuretics). Symptoms of mild or moderate dehydration may include thirst, dry lips and mouth, and dizziness. Symptoms of severe dehydration may include increased heart rate, confusion, fainting, and not urinating. In severe cases, you may need to get fluids through an IV at the hospital. For mild or moderate cases, you can usually rehydrate at home by drinking certain fluids as told by your health care provider. What are the risks? Your health care provider will talk with you about risks. Your health care provider will talk with you about risks. This may include taking in too much fluid (overhydration). This is rare. Overhydration can cause an imbalance of electrolytes in the body, kidney failure, or a decrease in salt (sodium) levels in the body. Supplies needed: You will need an oral rehydration solution (ORS) if your health care provider tells you to use one. This is a drink to treat dehydration. It can be found in pharmacies and retail stores. How to rehydrate Fluids Follow instructions from your health care provider about what to drink. The kind of fluid and the amount you should drink depend on your condition. In general, you should choose drinks that you prefer. If told by your health care provider, drink an ORS. Make an  ORS by following instructions on the package. Start by drinking small amounts, about  cup (120 mL) every 5-10 minutes. Slowly increase how much you drink until you have taken in the amount recommended by your health care provider. Drink enough clear fluids to keep your urine pale yellow. If you were told to drink an ORS, finish it first, then start slowly drinking other clear fluids. Drink fluids such as: Water. This includes sparkling and flavored water. Drinking only water can lead to having too little sodium in your body (hyponatremia). Follow the advice of your health care provider. Water from ice chips you suck on. Fruit juice with water added to it (diluted). Sports drinks. Hot or cold herbal teas. Broth-based soups. Milk or milk products. Food Follow instructions from your health care provider about what to eat while you rehydrate. Your health care provider may recommend that you slowly begin eating regular foods in small amounts. Eat foods that contain a healthy balance of electrolytes, such as bananas, oranges, potatoes, tomatoes, and spinach. Avoid foods that are greasy or contain a lot of sugar. In some cases, you may get nutrition through a feeding tube that is passed through your nose and into your stomach (nasogastric tube, or NG tube). This may be done if you have uncontrolled vomiting or diarrhea. Drinks to avoid  Certain drinks may make dehydration worse. While you rehydrate, avoid drinking alcohol. How to tell if you are recovering from dehydration You may be getting better if: You are urinating more often than before you started rehydrating. Your urine is pale yellow. Your energy level improves. You vomit less  often. You have diarrhea less often. Your appetite improves or returns to normal. You feel less dizzy or light-headed. Your skin tone and color start to look more normal. Follow these instructions at home: Take over-the-counter and prescription medicines only  as told by your health care provider. Do not take sodium tablets. Doing this can lead to having too much sodium in your body (hypernatremia). Contact a health care provider if: You continue to have symptoms of mild or moderate dehydration, such as: Thirst. Dry lips. Slightly dry mouth. Dizziness. Dark urine or less urine than normal. Muscle cramps. You continue to vomit or have diarrhea. Get help right away if: You have symptoms of dehydration that get worse. You have a fever. You have a severe headache. You have been vomiting and have problems, such as: Your vomiting gets worse or does not go away. Your vomit includes blood or green matter (bile). You cannot eat or drink without vomiting. You have problems with urination or bowel movements, such as: Diarrhea that gets worse or does not go away. Blood in your stool (feces). This may cause stool to look black and tarry. Not urinating, or urinating only a small amount of very dark urine, within 6-8 hours. You have trouble breathing. You have symptoms that get worse with treatment. These symptoms may be an emergency. Get help right away. Call 911. Do not wait to see if the symptoms will go away. Do not drive yourself to the hospital. This information is not intended to replace advice given to you by your health care provider. Make sure you discuss any questions you have with your health care provider. Document Revised: 12/06/2021 Document Reviewed: 12/06/2021 Elsevier Patient Education  2024 [employer].  Ondansetron Injection What is this medication? ONDANSETRON (on DAN se tron) prevents nausea and vomiting from chemotherapy, radiation, or surgery. It works by blocking substances in the body that may cause nausea or vomiting. It belongs to a class of medications called antiemetics. This medicine may be used for other purposes; ask your health care provider or pharmacist if you have questions. COMMON BRAND NAME(S): Zofran, Zofran  in Dextrose, Zofran Solution What should I tell my care team before I take this medication? They need to know if you have any of these conditions: Heart disease History of irregular heartbeat Liver disease Low levels of magnesium or potassium in the blood An unusual or allergic reaction to ondansetron, granisetron, other medications, foods, dyes, or preservatives Pregnant or trying to get pregnant Breast-feeding How should I use this medication? This medication is injected into a vein. It is given by your care team in a hospital or clinic setting. Talk to your care team about the use of this medication in children. Special care may be needed. Overdosage: If you think you have taken too much of this medicine contact a poison control center or emergency room at once. NOTE: This medicine is only for you. Do not share this medicine with others. What if I miss a dose? This does not apply. What may interact with this medication? Do not take this medication with any of the following: Apomorphine Certain medications for fungal infections, such as fluconazole, itraconazole, ketoconazole, posaconazole, voriconazole Cisapride Dronedarone Pimozide Thioridazine This medication may also interact with the following: Carbamazepine Certain medications for depression, anxiety, or mental health conditions Fentanyl Linezolid MAOIs, such as Carbex, Eldepryl, Marplan, Nardil, and Parnate Methylene blue (injected into a vein) Other medications that cause heart rhythm changes, such as dofetilide, ziprasidone Phenytoin Rifampicin Tramadol  This list may not describe all possible interactions. Give your health care provider a list of all the medicines, herbs, non-prescription drugs, or dietary supplements you use. Also tell them if you smoke, drink alcohol, or use illegal drugs. Some items may interact with your medicine. What should I watch for while using this medication? Your condition will be  monitored carefully while you are receiving this medication. What side effects may I notice from receiving this medication? Side effects that you should report to your care team as soon as possible: Allergic reactions--skin rash, itching, hives, swelling of the face, lips, tongue, or throat Bowel blockage--stomach cramping, unable to have a bowel movement or pass gas, loss of appetite, vomiting Chest pain (angina)--pain, pressure, or tightness in the chest, neck, back, or arms Heart rhythm changes--fast or irregular heartbeat, dizziness, feeling faint or lightheaded, chest pain, trouble breathing Irritability, confusion, fast or irregular heartbeat, muscle stiffness, twitching muscles, sweating, high fever, seizure, chills, vomiting, diarrhea, which may be signs of serotonin syndrome Side effects that usually do not require medical attention (report to your care team if they continue or are bothersome): Constipation Diarrhea General discomfort and fatigue Headache This list may not describe all possible side effects. Call your doctor for medical advice about side effects. You may report side effects to FDA at 1-800-FDA-1088. Where should I keep my medication? This medication is given in a hospital or clinic and will not be stored at home. NOTE: This sheet is a summary. It may not cover all possible information. If you have questions about this medicine, talk to your doctor, pharmacist, or health care provider.  2024 Elsevier/Gold Standard (2021-12-23 00:00:00)re  Oxycodone Capsules or Tablets What is this medication? OXYCODONE (ox i KOE done) treats severe pain. It is prescribed when other pain medications do not work well enough or cannot be tolerated. It works by blocking pain signals in the brain. It belongs to a group of medications called opioids. This medicine may be used for other purposes; ask your health care provider or pharmacist if you have questions. COMMON BRAND NAME(S):  Dazidox, Endocodone, Oxaydo, OXECTA, OxyIR, Percolone, Roxicodone, Roxybond What should I tell my care team before I take this medication? They need to know if you have any of these conditions: Brain tumor Frequently drink alcohol Head injury Heart disease History of pancreatitis Kidney disease Liver disease Low adrenal gland function Lung or breathing disease, such as asthma Seizures Stomach or intestine problems Substance use disorder Taken an MAOI such as Marplan, Nardil, or Parnate in the last 14 days An unusual or allergic reaction to oxycodone, other medications, foods, dyes, or preservatives Pregnant or trying to get pregnant Breastfeeding How should I use this medication? Take this medication by mouth with water. Take it as directed on the prescription label at the same time every day. You can take it with or without food. If it upsets your stomach, take it with food. Keep taking it unless your care team tells you to stop. Some brands of this medication, like Oxaydo, have special instructions. Ask your care team or pharmacist if these directions are for you: Do not cut, crush or chew this medication. Do not wet, soak, or lick the tablet before you take it. A special MedGuide will be given to you by the pharmacist with each prescription and refill. Be sure to read this information carefully each time. Talk to your care team regarding the use of this medication in children. Special care may be needed. Overdosage: If  you think you have taken too much of this medicine contact a poison control center or emergency room at once. NOTE: This medicine is only for you. Do not share this medicine with others. What if I miss a dose? If you miss a dose, take it as soon as you can. If it is almost time for your next dose, take only that dose. Do not take double or extra doses. What may interact with this medication? Do not take this medication with any of the following: Safinamide This  medication may interact with the following: Alcohol Antihistamines for allergy, cough, and cold Atropine Certain antivirals for HIV or hepatitis Certain antibiotics, such as clarithromycin, erythromycin, linezolid, rifampin Certain medications for anxiety or sleep Certain medications for bladder problems, such as oxybutynin, tolterodine Certain medications for depression, such as amitriptyline, fluoxetine, sertraline Certain medications for fungal infections, such as ketoconazole, itraconazole, posaconazole Certain medications for migraine headache, such as almotriptan, eletriptan, frovatriptan, naratriptan, rizatriptan, sumatriptan, zolmitriptan Certain medications for nausea or vomiting, such as dolasetron, granisetron, ondansetron, palonosetron Certain medications for Parkinson disease, such as benztropine, trihexyphenidyl Certain medications for seizures, such as carbamazepine, phenobarbital, phenytoin, primidone Certain medications for stomach problems, such as dicyclomine, hyoscyamine Certain medications for travel sickness, such as scopolamine Diuretics General anesthetics, such as halothane, isoflurane, methoxyflurane, propofol Ipratropium MAOIs, such as Marplan, Nardil, and Parnate Medications that relax muscles Methylene blue Other opioid medications for pain or cough Phenothiazines, such as chlorpromazine, mesoridazine, prochlorperazine, thioridazine This list may not describe all possible interactions. Give your health care provider a list of all the medicines, herbs, non-prescription drugs, or dietary supplements you use. Also tell them if you smoke, drink alcohol, or use illegal drugs. Some items may interact with your medicine. What should I watch for while using this medication? Tell your care team if your pain does not go away, if it gets worse, or if you have new or a different type of pain. You may develop tolerance to this medication. Tolerance means that you will need a  higher dose of the medication for pain relief. Tolerance is normal and is expected if you take this medication for a long time. Taking this medication with other substances that cause drowsiness, such as alcohol, benzodiazepines, or other opioids can cause serious side effects. Give your care team a list of all medications you use. They will tell you how much medication to take. Do not take more medication than directed. Call emergency services if you have problems breathing or staying awake. Long term use of this medication may cause your brain and body to depend on it. This can happen even when used as directed by your care team. You and your care team will work together to determine how long you will need to take this medication. If your care team wants you to stop this medication, the dose will be slowly lowered over time to reduce the risk of side effects. Naloxone is an emergency medication used for an opioid overdose. An overdose can happen if you take too much of an opioid. It can also happen if an opioid is taken with some other medications or substances such as alcohol. Know the symptoms of an overdose, such as trouble breathing, unusually tired or sleepy, or not being able to respond or wake up. Make sure to tell caregivers and close contacts where your naloxone is stored. Make sure they know how to use it. After naloxone is given, the person giving it must call emergency services. Naloxone is  a temporary treatment. Repeat doses may be needed. This medication may affect your coordination, reaction time, or judgment. Do not drive or operate machinery until you know how this medication affects you. Sit up or stand slowly to reduce the risk of dizzy or fainting spells. Drinking alcohol with this medication can increase the risk of these side effects. This medication will cause constipation. If you do not have a bowel movement for 3 days, call your care team. Your mouth may get dry. Chewing sugarless  gum or sucking hard candy and drinking plenty of water may help. Contact your care team if the problem does not go away or is severe. Talk to your care team if you may be pregnant. Prolonged use of this medication during pregnancy can cause temporary withdrawal in a newborn. Talk to your care team before breastfeeding. Changes to your treatment plan may be needed. If you breastfeed while taking this medication, seek medical care right away if you notice the child has slow or noisy breathing, is unusually sleepy or not able to wake up, or is limp. Long-term use of this medication may cause infertility. Talk to your care team if you are concerned about your fertility. What side effects may I notice from receiving this medication? Side effects that you should report to your care team as soon as possible: Allergic reactions--skin rash, itching, hives, swelling of the face, lips, tongue, or throat CNS depression--slow or shallow breathing, shortness of breath, feeling faint, dizziness, confusion, difficulty staying awake Low adrenal gland function--nausea, vomiting, loss of appetite, unusual weakness or fatigue, dizziness Low blood pressure--dizziness, feeling faint or lightheaded, blurry vision Side effects that usually do not require medical attention (report to your care team if they continue or are bothersome): Constipation Dizziness Drowsiness Dry mouth Headache Nausea Vomiting This list may not describe all possible side effects. Call your doctor for medical advice about side effects. You may report side effects to FDA at 1-800-FDA-1088. Where should I keep my medication? Keep out of the reach of children and pets. This medication can be abused. Keep it in a safe place to protect it from theft. Do not share it with anyone. It is only for you. Selling or giving away this medication is dangerous and against the law. Store at [employer] C (77 degrees F). Protect from light and moisture. Keep the  container tightly closed. Get rid of any unused medication after the expiration date. This medication may cause harm and death if it is taken by other adults, children, or pets. It is important to get rid of the medication as soon as you no longer need it, or it is expired. You can do this in two ways: Take the medication to a medication take-back program. Check with your pharmacy or law enforcement to find a location. If you cannot return the medication, flush it down the toilet. NOTE: This sheet is a summary. It may not cover all possible information. If you have questions about this medicine, talk to your doctor, pharmacist, or health care provider.  2024 Elsevier/Gold Standard (2022-08-25 00:00:00)  Lidocaine Oral Topical Solution What is this medication? LIDOCAINE (LYE doe kane) relieves minor pain and irritation in your mouth and throat. It works by numbing the affected area. It belongs to a group of medications called local anesthetics. This medicine may be used for other purposes; ask your health care provider or pharmacist if you have questions. COMMON BRAND NAME(S): Lidomar, LTA, Professional DNA Collection Kit, Xylocaine, Xylocaine Viscous, Zilactin-L  What should I tell my care team before I take this medication? They need to know if you have any of these conditions: Heart disease Liver disease An unusual or allergic reaction to lidocaine, other medications, foods, dyes, or preservatives Pregnant or trying to get pregnant Breast-feeding How should I use this medication? The medication is for topical use in the mouth or throat. Do not swallow this medication unless you have been told to. Follow the directions on the prescription label. Use a specially marked spoon or container to measure the solution. Ask your pharmacist if you do not have one. If you have a sore place in your mouth, you can apply the medication with a cotton-tipped applicator. The solution can be swished around the  mouth or gargled. Take your medication at regular intervals. Do not take your medication more often than directed. Do not use this medication in infants and children for teething pain. Talk to your care team about the use of this medication in children. Special care may be needed. People over 65 years may have a stronger reaction and need a smaller dose. Overdosage: If you think you have taken too much of this medicine contact a poison control center or emergency room at once. NOTE: This medicine is only for you. Do not share this medicine with others. What if I miss a dose? This does not apply. This medication is not for regular use. It should only be used as needed. What may interact with this medication? Do not take this medication with any of the following: MAOIs, such as Carbex, Eldepryl, Marplan, Nardil, and Parnate This medication may also interact with the following: Other local anesthetics This list may not describe all possible interactions. Give your health care provider a list of all the medicines, herbs, non-prescription drugs, or dietary supplements you use. Also tell them if you smoke, drink alcohol, or use illegal drugs. Some items may interact with your medicine. What should I watch for while using this medication? Be careful to avoid injury while the area is numb and you are not aware of pain. If this medication is used in the mouth or throat, do not chew gum or eat food for at least 1 hour. If the area is still numb, you may choke or bite your tongue or cheek if you try to chew or swallow. Also, you may not feel pain from hot foods or drinks. What side effects may I notice from receiving this medication? Side effects that you should report to your care team as soon as possible: Allergic reactions--skin rash, itching, hives, swelling of the face, lips, tongue, or throat Headache, unusual weakness or fatigue, shortness of breath, nausea, vomiting, rapid heartbeat, blue skin or  lips, which may be signs of methemoglobinemia Heart rhythm changes--fast or irregular heartbeat, dizziness, feeling faint or lightheaded, chest pain, trouble breathing Side effects that usually do not require medical attention (report to your care team if they continue or are bothersome): Irritation at application site This list may not describe all possible side effects. Call your doctor for medical advice about side effects. You may report side effects to FDA at 1-800-FDA-1088. Where should I keep my medication? Keep out of reach of children and pets. Store at room temperature between 15 and 30 degrees C (59 and 86 degrees F). Do not freeze. NOTE: This sheet is a summary. It may not cover all possible information. If you have questions about this medicine, talk to your doctor, pharmacist, or health care provider.  2024 Elsevier/Gold Standard (2021-08-16 00:00:00)

## 2023-09-19 NOTE — Progress Notes (Signed)
Oncology Nurse Navigator Documentation   Met with Mr. Haffey after final RT to offer support and to celebrate end of radiation treatment.   Provided verbal  post-RT guidance: Importance of keeping all follow-up appts, especially those with Nutrition and SLP. Continuation of Sonafine application 2-3 times daily, application of antibiotic ointment to areas of raw skin; when supply of Sonafine exhausted transition to OTC lotion with vitamin E.  Explained my role as navigator will continue for several more months, encouraged him to call me with needs/concerns.    Hedda Slade RN, BSN, OCN Head & Neck Oncology Nurse Navigator [facility] Phone # [phone]  Fax # [phone]

## 2023-09-19 NOTE — Telephone Encounter (Signed)
Spoke with the pt and he states that he is doing very well.  He has moved his bowels and has started colace daily.  He does not wish to make an appt for follow up but does agree to call if he has any further complaints.

## 2023-09-20 NOTE — Radiation Completion Notes (Signed)
Patient Name: Bryan Fleming, Bryan Fleming MRN: [id number] Date of Birth: February 27, 1961 Referring Physician: Cheron Schaumann, M.D. Date of Service: 2023-09-20 Radiation Oncologist: Lonie Peak, M.D. 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, M=cM0 Intent: Curative     ==========DELIVERED PLANS==========  First Treatment Date: 2023-07-31 Last Treatment Date: 2023-09-19   Plan Name: HN_Throat Site: Laryngopharynx Technique: IMRT Mode: Photon Dose Per Fraction: 2 Gy Prescribed Dose (Delivered / Prescribed): 70 Gy / 70 Gy Prescribed Fxs (Delivered / Prescribed): 35 / 35     ==========ON TREATMENT VISIT DATES========== 2023-07-31, 2023-08-14, 2023-08-21, 2023-08-29, 2023-09-05, 2023-09-11, 2023-09-18     ==========UPCOMING VISITS==========       ==========APPENDIX - ON TREATMENT VISIT NOTES==========   See weekly On Treatment Notes in Epic for details in the Media tab (listed as Progress notes on the On Treatment Visit Dates listed above).

## 2023-09-20 NOTE — Progress Notes (Signed)
   Mr. Natzke presents to the clinic today for a two week follow up. 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   Pain issues, if any: 8 1/2 throat pain, patient uses oxycodone for pain management. Using a feeding tube?: Patient does have feeding tube, uses it four times a day. Patient is drinking ensures and pudding. Weight changes, if any: Patient has lost a few pounds since he finished treatment. Swallowing issues, if any: Patient denies but he does experience pain while swallowing. Smoking or chewing tobacco? Patient currently smokes. Using fluoride toothpaste daily? Patient is using fluoride toothpaste Last ENT visit was on: Patient unsure Other notable issues, if any: None   BP 137/78 (BP Location: Left Arm, Patient Position: Sitting, Cuff Size: Normal)   Pulse (!) 123   Temp 97.6 F (36.4 C)   Resp 20   Ht 5\' 9"  (1.753 m)   Wt 132 lb (59.9 kg)   SpO2 100%   BMI 19.49 kg/m   Wt Readings from Last 3 Encounters:  10/04/23 132 lb (59.9 kg)  09/21/23 131 lb 13.4 oz (59.8 kg)  09/16/23 138 lb (62.6 kg)

## 2023-09-21 ENCOUNTER — Inpatient Hospital Stay: Payer: Medicare HMO

## 2023-09-21 ENCOUNTER — Encounter: Payer: Self-pay | Admitting: Hematology

## 2023-09-21 ENCOUNTER — Encounter (HOSPITAL_COMMUNITY): Payer: Self-pay | Admitting: Speech Pathology

## 2023-09-21 ENCOUNTER — Ambulatory Visit (HOSPITAL_COMMUNITY): Payer: Medicare HMO | Attending: Radiation Oncology | Admitting: Speech Pathology

## 2023-09-21 ENCOUNTER — Inpatient Hospital Stay: Payer: Medicare HMO | Admitting: Hematology

## 2023-09-21 VITALS — BP 117/77 | HR 123 | Temp 98.3°F | Resp 18 | Wt 131.8 lb

## 2023-09-21 DIAGNOSIS — Z800 Family history of malignant neoplasm of digestive organs: Secondary | ICD-10-CM

## 2023-09-21 DIAGNOSIS — C771 Secondary and unspecified malignant neoplasm of intrathoracic lymph nodes: Secondary | ICD-10-CM | POA: Diagnosis not present

## 2023-09-21 DIAGNOSIS — D508 Other iron deficiency anemias: Secondary | ICD-10-CM

## 2023-09-21 DIAGNOSIS — C321 Malignant neoplasm of supraglottis: Secondary | ICD-10-CM

## 2023-09-21 DIAGNOSIS — F17210 Nicotine dependence, cigarettes, uncomplicated: Secondary | ICD-10-CM

## 2023-09-21 DIAGNOSIS — R1310 Dysphagia, unspecified: Secondary | ICD-10-CM

## 2023-09-21 DIAGNOSIS — R1312 Dysphagia, oropharyngeal phase: Secondary | ICD-10-CM | POA: Diagnosis not present

## 2023-09-21 DIAGNOSIS — G629 Polyneuropathy, unspecified: Secondary | ICD-10-CM | POA: Diagnosis not present

## 2023-09-21 DIAGNOSIS — C3412 Malignant neoplasm of upper lobe, left bronchus or lung: Secondary | ICD-10-CM | POA: Diagnosis not present

## 2023-09-21 DIAGNOSIS — Z5111 Encounter for antineoplastic chemotherapy: Secondary | ICD-10-CM | POA: Diagnosis not present

## 2023-09-21 LAB — CBC WITH DIFFERENTIAL/PLATELET
Abs Immature Granulocytes: 0.04 10*3/uL (ref 0.00–0.07)
Basophils Absolute: 0 10*3/uL (ref 0.0–0.1)
Basophils Relative: 0 %
Eosinophils Absolute: 0 10*3/uL (ref 0.0–0.5)
Eosinophils Relative: 0 %
HCT: 31.1 % — ABNORMAL LOW (ref 39.0–52.0)
Hemoglobin: 10.6 g/dL — ABNORMAL LOW (ref 13.0–17.0)
Immature Granulocytes: 1 %
Lymphocytes Relative: 12 %
Lymphs Abs: 0.8 10*3/uL (ref 0.7–4.0)
MCH: 33.7 pg (ref 26.0–34.0)
MCHC: 34.1 g/dL (ref 30.0–36.0)
MCV: 98.7 fL (ref 80.0–100.0)
Monocytes Absolute: 1.1 10*3/uL — ABNORMAL HIGH (ref 0.1–1.0)
Monocytes Relative: 16 %
Neutro Abs: 5 10*3/uL (ref 1.7–7.7)
Neutrophils Relative %: 71 %
Platelets: 198 10*3/uL (ref 150–400)
RBC: 3.15 MIL/uL — ABNORMAL LOW (ref 4.22–5.81)
RDW: 15.1 % (ref 11.5–15.5)
WBC: 7 10*3/uL (ref 4.0–10.5)
nRBC: 0 % (ref 0.0–0.2)

## 2023-09-21 LAB — COMPREHENSIVE METABOLIC PANEL
ALT: 10 U/L (ref 0–44)
AST: 17 U/L (ref 15–41)
Albumin: 3.1 g/dL — ABNORMAL LOW (ref 3.5–5.0)
Alkaline Phosphatase: 62 U/L (ref 38–126)
Anion gap: 11 (ref 5–15)
BUN: 11 mg/dL (ref 8–23)
CO2: 26 mmol/L (ref 22–32)
Calcium: 9.2 mg/dL (ref 8.9–10.3)
Chloride: 97 mmol/L — ABNORMAL LOW (ref 98–111)
Creatinine, Ser: 0.66 mg/dL (ref 0.61–1.24)
GFR, Estimated: >60 mL/min (ref >60)
Glucose, Bld: 128 mg/dL — ABNORMAL HIGH (ref 70–99)
Potassium: 3.9 mmol/L (ref 3.5–5.1)
Sodium: 134 mmol/L — ABNORMAL LOW (ref 135–145)
Total Bilirubin: 0.4 mg/dL (ref 0.0–1.2)
Total Protein: 7.4 g/dL (ref 6.5–8.1)

## 2023-09-21 LAB — IRON AND TIBC
Iron: 52 ug/dL (ref 45–182)
Saturation Ratios: 24 % (ref 17.9–39.5)
TIBC: 221 ug/dL — ABNORMAL LOW (ref 250–450)
UIBC: 169 ug/dL

## 2023-09-21 LAB — FOLATE: Folate: 8.1 ng/mL (ref >5.9)

## 2023-09-21 LAB — MAGNESIUM: Magnesium: 1.8 mg/dL (ref 1.7–2.4)

## 2023-09-21 LAB — FERRITIN: Ferritin: 319 ng/mL (ref 24–336)

## 2023-09-21 LAB — VITAMIN B12: Vitamin B-12: 421 pg/mL (ref 180–914)

## 2023-09-21 MED ORDER — LIDOCAINE VISCOUS HCL 2 % MT SOLN: OROMUCOSAL | 3 refills | Status: AC

## 2023-09-21 MED ORDER — FLUCONAZOLE 100 MG PO TABS: ORAL_TABLET | ORAL | 0 refills | Status: DC

## 2023-09-21 NOTE — Progress Notes (Signed)
[facility] [address] [place] [address]    Clinic Day:  03/16/2023  Referring physician: Benetta Spar*  Patient Care Team: Benetta Spar, MD as PCP - General (Internal Medicine) Wendall Stade, MD as PCP - Cardiology (Cardiology) Therese Sarah, RN as Oncology Nurse Navigator (Oncology) Doreatha Massed, MD as Medical Oncologist (Medical Oncology) Malmfelt, Lise Auer, RN as Oncology Nurse Navigator Lonie Peak, MD as Consulting Physician (Radiation Oncology) [name], MD as Consulting Physician (Oncology) Laren Boom, DO as Consulting Physician (Otolaryngology) Lonie Peak, MD as Attending Physician (Radiation Oncology)   ASSESSMENT & PLAN:   Assessment:  1.  Supraglottic squamous cell carcinoma: - Incidental supraglottic mass found during endoscopy by Dr. Meridee Score - ENT Dr. Marene Lenz examination findings: Exophytic mass lesion involving right false vocal cord/arytenoids/aryepiglottic fold, partially obscures view of the right true vocal cord with preservation of vocal cord mobility.  Hypopharynx normal without mass. - PET scan (07/13/2023): Hypermetabolic right-sided supraglottic mass, hypermetabolic right level 3 lymph node.  No other metastatic disease. - Chemoradiation therapy with weekly cisplatin from 07/31/2023 through 09/19/2023 under the direction of Dr. Katheran Awe.  2.  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 Craige Cotta for left upper lobe lung mass. - Is current active smoker, 1/2 pack/day for 35 years. - CT lung cancer screening scan on 10/16/2020 showed cavitary mass in the anterior left upper lobe measuring 4.4 x 3.0 x 3.5 cm with 2.5 cm solid nodular component inferomedially.  This abuts and involves the anterior pleura/chest wall.  Expansile lytic lesion in the right posterolateral ninth and 10th ribs (unchanged dating back to CT from 06/16/2016).  10  mm short axis AP window node suspicious for nodal metastasis. - No change in baseline cough.  No hemoptysis.  No chest pain. - Brain MRI was negative for metastatic disease. - PET scan on 12/14/2020 showed left upper lobe mass measuring 3.9 x 4 cm with SUV 17.1.  Some contiguous hypermetabolism along the left chest wall including anterolateral left third rib with SUV 5.4.  No gross osseous destruction.  AP window lymph node of 9 mm, mildly hypermetabolic, SUV 2.8. - On 12/23/2020 left upper lobe brushing and lavage consistent with squamous cell carcinoma. - Left upper lobe fine-needle aspiration was also consistent with squamous cell carcinoma.  Lymph node could not be reached. - Caris testing with limited tissue showed MMR proficient, BRAF V6 energy negative, PD-L1 negative by 22 C3, 28-8, SP142.  (IHC/CISH results should be interpreted with caution given the potential for false negative results) - 3 cycles of carboplatin, paclitaxel and nivolumab from 01/13/2021 through 02/24/2021 based on [id number]. - Left upper lobectomy and lymph node resection on 04/21/2021. - Pathology consistent with invasive moderately differentiated squamous cell carcinoma, 5 cm, margins negative.  Visceral pleura not involved.  Metastatic squamous cell carcinoma in 1/2 hilar lymph nodes.  Levels 5, 11, 12, 10, 11 and 12 lymph nodes were negative.  No definitive tumor response identified.  No lymphovascular invasion.  ypT2b, ypN1. - Caris testing on resection sample from 04/21/2021 shows PD-L1 TPS 1%.  No targetable mutations.  MSI stable.  TMB low.   2.  Social/family history: - Previously worked in Designer, fashion/clothing, currently on disability. - Current active smoker, 1/2 pack/day for 35 years.  Now smokes about a pack a day. - Drinks 6 pack of beers along with 1 quart of liquor every day. - 1 brother  has recurrent throat cancer and another brother died of throat cancer metastasized to the brain.  Plan:  1.  Stage III (T3 N1 M0)  supraglottic squamous cell carcinoma: - He has completed last radiation therapy on 09/19/2023. - Reports odynophagia. - Continue to use lidocaine 2% solution 4 times daily as needed.  Continue Carafate as well.  He is also taking oxycodone 20 mg every 6 hours as needed. - Labs today: Creatinine and LFTs are normal.  CBC shows normal white count and platelet count.  Hemoglobin improved to 10.6.  Ferritin, iron panel, B12 and folic acid levels were normal. - Recommend RTC in 6 weeks with CT soft tissue neck and TSH level. - Plan to repeat PET scan around 12 weeks.  2.  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 - Reviewed CT chest from 09/14/2023: Left upper lobectomy with no findings to suggest recurrence or metastatic disease.  3.  Nutrition: - He is taking and 3 cans of Osmolite 1.5/day via PEG tube.  He is drinking 2 cans of Ensure 350 cal/day.  He is able to eat soft foods once daily. - Albumin is low at 3.1 today.  Continue to follow up with our dietitian. - Will order modified barium swallow study.   4.  Neuropathy: - He has numbness in the hands and feet which is slightly worse after last treatment.  He is not on any medications.    No orders of the defined types were placed in this encounter.     Alben Deeds Teague,acting as a Neurosurgeon for Doreatha Massed, MD.,have documented all relevant documentation on the behalf of Doreatha Massed, MD,as directed by  Doreatha Massed, MD while in the presence of Doreatha Massed, MD.  I, Doreatha Massed MD, have reviewed the above documentation for accuracy and completeness, and I agree with the above.    Rexford Maus   2/13/20258:25 AM  CHIEF COMPLAINT:   Diagnosis: bronchogenic carcnioma in left upper lobe of lung    Cancer Staging  Cancer of soft palate (HCC) Staging form: Pharynx - P16 Negative Oropharynx, AJCC 8th Edition - Clinical  stage from 07/19/2023: Stage III (cT3, cN1, cM0, p16-) - Signed by Lonie Peak, MD on 07/19/2023  Primary lung squamous cell carcinoma, left (HCC) Staging form: Lung, AJCC 8th Edition - Clinical stage from 12/31/2020: Stage IIIA (cT2b, cN2, cM0) - Signed by Doreatha Massed, MD on 01/01/2021  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 [facility]) Staging form: Larynx - Supraglottis, AJCC 8th Edition - Clinical: Stage III (cT3, cN1, cM0) - Signed by [name], MD on 09/08/2023    Prior Therapy: carboplatin, paclitaxel and nivolumab q21d x3 cycles   Current Therapy:  surveillance    HISTORY OF PRESENT ILLNESS:   Oncology History  Primary lung squamous cell carcinoma, left (HCC)  12/31/2020 Cancer Staging   Staging form: Lung, AJCC 8th Edition - Clinical stage from 12/31/2020: Stage IIIA (cT2b, cN2, cM0) - Signed by Doreatha Massed, MD on 01/01/2021 Stage prefix: Initial diagnosis   01/01/2021 Initial Diagnosis   Primary lung squamous cell carcinoma, left (HCC)   01/13/2021 - 02/24/2021 Chemotherapy   Patient is on Treatment Plan : LUNG NSCLC Carboplatin / Paclitaxel q21d x 3 cycles     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)  07/19/2023 Initial Diagnosis   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)   07/19/2023  Cancer Staging   Staging form: Larynx - Supraglottis, AJCC 8th Edition - Clinical: Stage III (cT3, cN1, cM0) - Signed by [name], MD on 09/08/2023   Cancer of soft palate (HCC)  07/19/2023 Initial Diagnosis   Cancer of soft palate (HCC)   07/19/2023 Cancer Staging   Staging form: Pharynx - P16 Negative Oropharynx, AJCC 8th Edition - Clinical stage from 07/19/2023: Stage III (cT3, cN1, cM0, p16-) - Signed by Lonie Peak, MD on 07/19/2023 Stage prefix: Initial diagnosis   07/31/2023 -  Chemotherapy   Patient is on Treatment Plan : HEAD/NECK Cisplatin (40) q7d     Malignant neoplasm of supraglottis (HCC)  (Resolved)  07/19/2023 Initial Diagnosis   Malignant neoplasm of supraglottis (HCC)      INTERVAL HISTORY:   Alcides is a 62 y.o. male presenting to clinic today for follow up of bronchogenic carcnioma in left upper lobe of lung. He was last seen by me on 03/16/23.  Since his last visit, he underwent a laryngoscopy with biopsies on 07/04/23 with Dr. Marene Lenz. Biopsies of the post cricoid, right arytenoid, right aryepiglottic, left soft palate, and right tonsil revealed: moderate to poorly differentiated invasive squamous cell carcinoma. After soft palate cancer diagnosis, Mr. Lessley underwent chemoradiation with weekly cisplatin started on 07/31/23 and IMRT treatment. He completed chemotherapy under Dr. Arlana Pouch with dose reduced cisplatin at 30 mg/m on cycle 7 of treatment on 09/11/2023. IMRT was completed on 09/19/23 under Dr.Squire.   Emmanuel had an initial PET on 07/13/23 that found: Hypermetabolic right-sided supraglottic mass consistent with the patient's known primary malignancy. Hypermetabolic adjacent right level III lymph node consistent with metastatic disease. No findings for distant metastatic disease involving the chest, abdomen/pelvis or bony structures. Stable surgical changes from a left upper lobe lobectomy. No findings for residual or recurrent lung cancer. Age advanced vascular disease. Cholelithiasis. Aortic atherosclerosis and emphysema.  CT A/P on 09/16/23 found: Wall thickening of the gastric antrum and first portion duodenum, with inflammatory changes and trace free fluid extending to the porta hepatis. Findings are consistent with gastritis/duodenitis or peptic ulcer disease. Large amount of retained stool within the rectum consistent with fecal impaction. Associated rectal wall thickening and perirectal fat stranding compatible with stercoral colitis. Cholelithiasis without evidence of acute cholecystitis. Aortic Atherosclerosis.   CT chest on 09/14/23 found: Status post left  upper lobectomy with no findings to suggest metastatic disease in the thorax. Aortic atherosclerosis, in addition to left main and three-vessel coronary artery disease. Diffuse bronchial wall thickening with moderate centrilobular and paraseptal emphysema; imaging findings suggestive of underlying COPD.  Of note, he had a G tube placed on 07/27/23.   Gennaro had an EGD on 05/15/23 with Dr. Meridee Score. Pathology of the stomach biopsy revealed: Gastric antral/oxyntic mucosa with no significant diagnostic alteration, negative for intestinal metaplasia or dysplasia. The duodenum biopsy revealed: Duodenal mucosa with foveolar metaplasia and Brunner gland hyperplasia consistent with chronic peptic duodenitis, negative for dysplasia.   Today, he states that he is doing well overall. His appetite level is at 50%. His energy level is at 50%.  Mikale reports odynophagia. He is able to eat soft foods once daily and has 3 cartons of Osmolite a day. He is drinking 2 Boost/Ensure a day as well as a bottle of water a day. Side effects from radiation include burning in the neck and decreased taste. He reports peripheral neuropathy in the hands and feet, in the form of numbness that has worsened since finishing chemo. Ibn is no longer  taking Cymbalta for neuropathy. He also has the sensation of coldness on the fingers, as well as nausea. Mr. Vanover notes difficulty picking up flat objects on flat surfaces, but is otherwise able to pick up/hold objects and tie his shoes when concentrating. He denies any tinnitus or hearing loss.   Konor currently smokes 1 ppd, though he recently got nicotine patches and lozenge in an attempt to quit. He is taking potassium supplements and would like a refill of mouthwash to help with throat pain.   He recently finished diflucan x 20 days prescribed by Dr. Basilio Cairo for thrush, though it has not resolved.  PAST MEDICAL HISTORY:   Past Medical History: Past Medical History:   Diagnosis Date   Cancer (HCC)    Lung cancer   COPD (chronic obstructive pulmonary disease) (HCC) 2007   EMPHYSEMA   Depression    Dyspnea    occasional - no oxygen   GERD (gastroesophageal reflux disease) 2010   Hepatitis    Alcoholic    HLD (hyperlipidemia)    no meds   Hypertension    Port-A-Cath in place 01/01/2021    Surgical History: Past Surgical History:  Procedure Laterality Date   BIOPSY  05/15/2023   Procedure: BIOPSY;  Surgeon: Lemar Lofty., MD;  Location: [facility];  Service: Gastroenterology;;   BRONCHIAL BIOPSY  12/23/2020   Procedure: BRONCHIAL BIOPSIES;  Surgeon: Leslye Peer, MD;  Location: [facility] ENDOSCOPY;  Service: Pulmonary;;   BRONCHIAL BRUSHINGS  12/23/2020   Procedure: BRONCHIAL BRUSHINGS;  Surgeon: Leslye Peer, MD;  Location: [facility] ENDOSCOPY;  Service: Pulmonary;;   BRONCHIAL NEEDLE ASPIRATION BIOPSY  12/23/2020   Procedure: BRONCHIAL NEEDLE ASPIRATION BIOPSIES;  Surgeon: Leslye Peer, MD;  Location: [facility] ENDOSCOPY;  Service: Pulmonary;;   BRONCHIAL WASHINGS  12/23/2020   Procedure: BRONCHIAL WASHINGS;  Surgeon: Leslye Peer, MD;  Location: [facility] ENDOSCOPY;  Service: Pulmonary;;   CARDIAC CATHETERIZATION  2010   COLONOSCOPY  03/14/2012   Procedure: COLONOSCOPY;  Surgeon: Malissa Hippo, MD;  Location: [facility];  Service: Endoscopy;  Laterality: N/A;  100   ESOPHAGOGASTRODUODENOSCOPY (EGD) WITH PROPOFOL N/A 05/15/2023   Procedure: ESOPHAGOGASTRODUODENOSCOPY (EGD) WITH PROPOFOL;  Surgeon: Meridee Score Netty Starring., MD;  Location: [facility] ENDOSCOPY;  Service: Gastroenterology;  Laterality: N/A;   EUS N/A 05/15/2023   Procedure: UPPER ENDOSCOPIC ULTRASOUND (EUS) RADIAL;  Surgeon: Lemar Lofty., MD;  Location: [facility] ENDOSCOPY;  Service: Gastroenterology;  Laterality: N/A;   INTERCOSTAL NERVE BLOCK  04/21/2021   Procedure: INTERCOSTAL NERVE BLOCK;  Surgeon: Loreli Slot, MD;  Location: [facility] OR;  Service: Thoracic;;   IR  GASTROSTOMY TUBE MOD SED  07/27/2023   IR IMAGING GUIDED PORT INSERTION  01/12/2021   LARYNGOSCOPY N/A 07/04/2023   Procedure: LARYNGOSCOPY WITH BIOPSIES;  Surgeon: Laren Boom, DO;  Location: [facility];  Service: ENT;  Laterality: N/A;   LUNG REMOVAL, PARTIAL  08/2021   PENILE PROSTHESIS IMPLANT N/A 02/17/2022   Procedure: PENILE PROTHESIS INFLATABLE;  Surgeon: [name], MD;  Location: [facility] ORS;  Service: Urology;  Laterality: N/A;   PORTACATH PLACEMENT  07/2021   REMOVAL OF PENILE PROSTHESIS N/A 10/13/2022   Procedure: REMOVAL OF PENILE PROSTHESIS;  Surgeon: [name], MD;  Location: [facility] ORS;  Service: Urology;  Laterality: N/A;   REMOVAL OF PENILE PROSTHESIS N/A 12/05/2022   Procedure: REMOVAL AND REPLACEMENT OF PENILE PROSTHESIS;  Surgeon: [name], MD;  Location: [facility] ORS;  Service: Urology;  Laterality: N/A;  120 MINUTES NEEDED FOR CASE   VIDEO ASSISTED THORACOSCOPY (VATS)/ LOBECTOMY Left 04/21/2021   Procedure: VIDEO ASSISTED THORACOSCOPY (VATS)/ LOBECTOMY;  Surgeon: Loreli Slot, MD;  Location: [facility] OR;  Service: Thoracic;  Laterality: Left;   VIDEO BRONCHOSCOPY WITH ENDOBRONCHIAL NAVIGATION N/A 12/23/2020   Procedure: VIDEO BRONCHOSCOPY WITH ENDOBRONCHIAL NAVIGATION;  Surgeon: Leslye Peer, MD;  Location: [facility] ENDOSCOPY;  Service: Pulmonary;  Laterality: N/A;   WRIST GANGLION EXCISION  1985    Social History: Social History   Socioeconomic History   Marital status: Married    Spouse name: Not on file   Number of children: Not on file   Years of education: Not on file   Highest education level: Not on file  Occupational History   Not on file  Tobacco Use   Smoking status: Every Day    Current packs/day: 1.00    Average packs/day: 1 pack/day for 37.0 years (37.0 ttl pk-yrs)    Types: Cigarettes   Smokeless tobacco: Never   Tobacco comments:    smokes 1/2 pack a day [facility] 03/08/21  Vaping Use   Vaping status: Never Used   Substance and Sexual Activity   Alcohol use: Not Currently    Alcohol/week: 56.0 standard drinks of alcohol    Types: 35 Cans of beer, 21 Shots of liquor per week    Comment: 5-6 beers and 2-3 shots daily-04/19/21   Drug use: No   Sexual activity: Yes  Other Topics Concern   Not on file  Social History Narrative   Not on file   Social Drivers of Health   Financial Resource Strain: Not on file  Food Insecurity: Food Insecurity Present (07/19/2023)   Hunger Vital Sign    Worried About Running Out of Food in the Last Year: Sometimes true    Ran Out of Food in the Last Year: Sometimes true  Transportation Needs: No Transportation Needs (07/19/2023)   PRAPARE - Administrator, Civil Service (Medical): No    Lack of Transportation (Non-Medical): No  Physical Activity: Inactive (12/03/2020)   Exercise Vital Sign    Days of Exercise per Week: 0 days    Minutes of Exercise per Session: 0 min  Stress: Not on file  Social Connections: Not on file  Intimate Partner Violence: Not At Risk (07/19/2023)   Humiliation, Afraid, Rape, and Kick questionnaire    Fear of Current or Ex-Partner: No    Emotionally Abused: No    Physically Abused: No    Sexually Abused: No    Family History: Family History  Problem Relation Age of Onset   Hypertension Mother    Asthma Father    Diabetes Sister    Hypertension Sister    Hypertension Brother    Hypertension Sister    Lupus Daughter     Current Medications:  Current Outpatient Medications:    acetaminophen (TYLENOL) 500 MG tablet, Take 2 tablets (1,000 mg total) by mouth every 6 (six) hours., Disp: 45 tablet, Rfl: 0   albuterol (VENTOLIN HFA) 108 (90 Base) MCG/ACT inhaler, Inhale 1-2 puffs into the lungs every 6 (six) hours as needed for wheezing or shortness of breath. (Patient not taking: Reported on 08/17/2023), Disp: , Rfl:    ALPRAZolam (XANAX) 0.5 MG tablet, Take 1 tablet (0.5 mg total) by mouth at bedtime as needed for  anxiety., Disp: 30 tablet, Rfl: 0   amLODipine (NORVASC) 5 MG tablet, Take 5 mg by mouth in the morning., Disp: , Rfl:  amoxicillin-clavulanate (AUGMENTIN) 875-125 MG tablet, Take 1 tablet by mouth every 12 (twelve) hours., Disp: 14 tablet, Rfl: 0   dexamethasone (DECADRON) 4 MG tablet, Take 2 tablets (8 mg) by mouth daily x 3 days starting the day after cisplatin chemotherapy. Take with food., Disp: 30 tablet, Rfl: 1   fluconazole (DIFLUCAN) 100 MG tablet, Take 2 tablets today, then 1 tablet daily x 20 more days., Disp: 22 tablet, Rfl: 0   Fluticasone-Umeclidin-Vilant (TRELEGY ELLIPTA) 100-62.5-25 MCG/INH AEPB, Inhale 1 puff into the lungs in the morning., Disp: , Rfl:    lidocaine (XYLOCAINE) 2 % solution, Patient: Mix 1part 2% viscous lidocaine, 1part H20. Swish & swallow 10mL of diluted mixture, before meals and at bedtime, up to QID, Disp: 200 mL, Rfl: 3   lidocaine-prilocaine (EMLA) cream, Apply to affected area once (Patient not taking: Reported on 09/01/2023), Disp: 30 g, Rfl: 3   loratadine (CLARITIN) 10 MG tablet, Take 10 mg by mouth daily. (Patient not taking: Reported on 08/25/2023), Disp: , Rfl:    magnesium oxide (MAG-OX) 400 (240 Mg) MG tablet, Take 1 tablet (400 mg total) by mouth daily., Disp: 30 tablet, Rfl: 0   metoprolol tartrate (LOPRESSOR) 50 MG tablet, Take 50 mg by mouth 2 (two) times daily., Disp: , Rfl:    Multiple Vitamin (MULTIVITAMIN WITH MINERALS) TABS tablet, Take 1 tablet by mouth in the morning. (Patient not taking: Reported on 08/11/2023), Disp: , Rfl:    Nutritional Supplements (FEEDING SUPPLEMENT, OSMOLITE 1.5 CAL,) LIQD, Give 2 cartons at 8am and noon via feeding tube.  Give 1 carton at 4pm and 8pm.  Flush with 60ml of water before and after each feeding.  Give additional 3 cups of water via tube for hydration if unable to drink orally. (Patient not taking: Reported on 09/01/2023), Disp: , Rfl:    ondansetron (ZOFRAN) 8 MG tablet, Take 1 tablet (8 mg total) by  mouth every 8 (eight) hours as needed for nausea or vomiting. Start on the third day after cisplatin., Disp: 30 tablet, Rfl: 1   Oxycodone HCl 20 MG TABS, Take 1 tablet (20 mg total) by mouth every 6 (six) hours as needed., Disp: 100 tablet, Rfl: 0   pantoprazole (PROTONIX) 20 MG tablet, Take 1 tablet (20 mg total) by mouth 2 (two) times daily for 14 days., Disp: 28 tablet, Rfl: 0   polyethylene glycol powder (MIRALAX) 17 GM/SCOOP powder, Take 17 g by mouth 3 (three) times daily for 5 days., Disp: 238 g, Rfl: 0   potassium chloride SA (KLOR-CON M) 20 MEQ tablet, Take 1 tablet (20 mEq total) by mouth 2 (two) times daily., Disp: 60 tablet, Rfl: 1   prochlorperazine (COMPAZINE) 10 MG tablet, Take 1 tablet (10 mg total) by mouth every 6 (six) hours as needed (Nausea or vomiting)., Disp: 30 tablet, Rfl: 1   sodium fluoride (FLUORISHIELD) 1.1 % GEL dental gel, Place 1 Application onto teeth at bedtime., Disp: , Rfl:    sucralfate (CARAFATE) 1 g tablet, Take 1 tablet (1 g total) by mouth with breakfast, with lunch, and with evening meal for 7 days., Disp: 21 tablet, Rfl: 0   Allergies: No Known Allergies   REVIEW OF SYSTEMS:   Review of Systems  Constitutional:  Negative for chills, fatigue and fever.  HENT:   Negative for lump/mass, mouth sores, nosebleeds, sore throat and trouble swallowing.        +throat pain, 9/10 severity +odynophagia  Eyes:  Negative for eye problems.  Respiratory:  Positive for cough and shortness of breath.   Cardiovascular:  Negative for chest pain, leg swelling and palpitations.  Gastrointestinal:  Positive for nausea. Negative for abdominal pain, constipation, diarrhea and vomiting.  Genitourinary:  Negative for bladder incontinence, difficulty urinating, dysuria, frequency, hematuria and nocturia.   Musculoskeletal:  Negative for arthralgias, back pain, flank pain, myalgias and neck pain.  Skin:  Negative for itching and rash.  Neurological:  Positive for numbness  (in hands and feet). Negative for dizziness and headaches.  Hematological:  Does not bruise/bleed easily.  Psychiatric/Behavioral:  Positive for sleep disturbance. Negative for depression and suicidal ideas. The patient is not nervous/anxious.   All other systems reviewed and are negative.    VITALS:   There were no vitals taken for this visit.  Wt Readings from Last 3 Encounters:  09/16/23 138 lb (62.6 kg)  09/11/23 135 lb (61.2 kg)  09/08/23 136 lb 12.8 oz (62.1 kg)    There is no height or weight on file to calculate BMI.  Performance status (ECOG): 1 - Symptomatic but completely ambulatory  PHYSICAL EXAM:   Physical Exam Vitals and nursing note reviewed. Exam conducted with a chaperone present.  Constitutional:      Appearance: Normal appearance.  HENT:     Mouth/Throat:     Comments: +oral thrush on the tongue, soft palate, and back of the throat Cardiovascular:     Rate and Rhythm: Normal rate and regular rhythm.     Pulses: Normal pulses.     Heart sounds: Normal heart sounds.  Pulmonary:     Effort: Pulmonary effort is normal.     Breath sounds: Normal breath sounds.  Abdominal:     Palpations: Abdomen is soft. There is no hepatomegaly, splenomegaly or mass.     Tenderness: There is no abdominal tenderness.  Musculoskeletal:     Right lower leg: No edema.     Left lower leg: No edema.  Lymphadenopathy:     Cervical: No cervical adenopathy.     Right cervical: No superficial, deep or posterior cervical adenopathy.    Left cervical: No superficial, deep or posterior cervical adenopathy.     Upper Body:     Right upper body: No supraclavicular or axillary adenopathy.     Left upper body: No supraclavicular or axillary adenopathy.  Neurological:     General: No focal deficit present.     Mental Status: He is alert and oriented to person, place, and time.  Psychiatric:        Mood and Affect: Mood normal.        Behavior: Behavior normal.    LABS:       Latest Ref Rng & Units 09/19/2023    1:50 PM 09/16/2023    2:51 PM 09/14/2023    1:17 PM  CBC  WBC 4.0 - 10.5 K/uL 6.5  7.0  6.1   Hemoglobin 13.0 - 17.0 g/dL 9.9  16.1  09.6   Hematocrit 39.0 - 52.0 % 29.2  31.1  31.3   Platelets 150 - 400 K/uL 218  246  248       Latest Ref Rng & Units 09/19/2023    1:49 PM 09/16/2023    2:51 PM 09/14/2023    1:17 PM  CMP  Glucose 70 - 99 mg/dL 045  409  811   BUN 8 - 23 mg/dL 11  14  19    Creatinine 0.61 - 1.24 mg/dL 9.14  7.82  9.56   Sodium  135 - 145 mmol/L 134  131  133   Potassium 3.5 - 5.1 mmol/L 3.9  4.1  4.0   Chloride 98 - 111 mmol/L 97  97  95   CO2 22 - 32 mmol/L 29  25  26    Calcium 8.9 - 10.3 mg/dL 9.2  8.8  8.9   Total Protein 6.5 - 8.1 g/dL  6.7  7.0   Total Bilirubin 0.0 - 1.2 mg/dL  0.2  0.4   Alkaline Phos 38 - 126 U/L  60  65   AST 15 - 41 U/L  16  17   ALT 0 - 44 U/L  10  11      No results found for: "CEA1", "CEA" / No results found for: "CEA1", "CEA" No results found for: "PSA1" No results found for: "ZOX096" No results found for: "CAN125"  No results found for: "TOTALPROTELP", "ALBUMINELP", "A1GS", "A2GS", "BETS", "BETA2SER", "GAMS", "MSPIKE", "SPEI" No results found for: "TIBC", "FERRITIN", "IRONPCTSAT" No results found for: "LDH"   STUDIES:   CT ABDOMEN PELVIS W CONTRAST Result Date: 09/16/2023 CLINICAL DATA:  Lower abdominal pain, constipation for 2 days, intermittent nausea, history of head and neck and left upper lobe lung cancer EXAM: CT ABDOMEN AND PELVIS WITH CONTRAST TECHNIQUE: Multidetector CT imaging of the abdomen and pelvis was performed using the standard protocol following bolus administration of intravenous contrast. RADIATION DOSE REDUCTION: This exam was performed according to the departmental dose-optimization program which includes automated exposure control, adjustment of the mA and/or kV according to patient size and/or use of iterative reconstruction technique. CONTRAST:  OMNIPAQUE IOHEXOL 300  MG/ML  SOLN COMPARISON:  11/02/2022, 07/13/2023 FINDINGS: Lower chest: No acute pleural or parenchymal lung disease. Hepatobiliary: Noncalcified gallstones or tumefactive sludge again noted within the gallbladder lumen. No evidence of gallbladder wall thickening. Liver is unremarkable. No biliary duct dilation. Pancreas: Normal enhancement of the pancreatic parenchyma. No inflammatory changes. 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 Spleen: Normal in size without focal abnormality. Adrenals/Urinary Tract: Adrenal glands are unremarkable. Kidneys are normal, without renal calculi, focal lesion, or hydronephrosis. Bladder is unremarkable. Stomach/Bowel: There is wall thickening of the gastric antrum and first portion duodenum, with inflammatory changes and free fluid extending to the porta hepatis. Findings are consistent with peptic ulcer disease or duodenitis. No evidence of perforation. Percutaneous gastrostomy tube is identified within the gastric lumen. No bowel obstruction or ileus. Normal appendix right lower quadrant. There is a large amount of retained stool within the rectal vault, with mild rectal wall thickening and perirectal fat stranding, compatible with fecal impaction and stercoral colitis. Vascular/Lymphatic: Extensive atherosclerosis throughout the aorta and iliac vessels, unchanged since prior exams. No pathologic adenopathy within the abdomen or pelvis. Reproductive: Stable appearance of the prostate. No acute findings. Penile prosthesis again noted. Reservoir within the right hemipelvis. Other: Small amount of free fluid within the right upper quadrant adjacent to the gastric antral and duodenal wall thickening as above. No free intraperitoneal gas. No abdominal wall hernia. Musculoskeletal: No acute or destructive bony abnormalities. Reconstructed images demonstrate no additional findings. IMPRESSION: 1. Wall thickening of the gastric antrum and first portion  duodenum, with inflammatory changes and trace free fluid extending to the porta hepatis. Findings are consistent with gastritis/duodenitis or peptic ulcer disease. 2. Large amount of retained stool within the rectum consistent with fecal impaction. Associated rectal wall thickening and perirectal fat stranding compatible with stercoral colitis. 3. Cholelithiasis without evidence of acute  cholecystitis. 4.  Aortic Atherosclerosis (ICD10-I70.0). Electronically Signed   By: Sharlet Salina M.D.   On: 09/16/2023 16:58

## 2023-09-21 NOTE — Patient Instructions (Signed)
[facility] at [facility] Discharge Instructions   You were seen and examined today by Dr. Ellin Saba.  He reviewed the results of your lab work which are normal/stable.   He reviewed the results of the CT of your chest which did not show any evidence of cancer.   We will see you back in .   Return as scheduled.    Thank you for choosing [facility] at [facility] to provide your oncology and hematology care.  To afford each patient quality time with our provider, please arrive at least 15 minutes before your scheduled appointment time.   If you have a lab appointment with the Cancer Center please come in thru the Main Entrance and check in at the main information desk.  You need to re-schedule your appointment should you arrive 10 or more minutes late.  We strive to give you quality time with our providers, and arriving late affects you and other patients whose appointments are after yours.  Also, if you no show three or more times for appointments you may be dismissed from the clinic at the providers discretion.     Again, thank you for choosing [facility].  Our hope is that these requests will decrease the amount of time that you wait before being seen by our physicians.       _____________________________________________________________  Should you have questions after your visit to [facility], please contact our office at [phone] and follow the prompts.  Our office hours are 8:00 a.m. and 4:30 p.m. Monday - Friday.  Please note that voicemails left after 4:00 p.m. may not be returned until the following business day.  We are closed weekends and major holidays.  You do have access to a nurse 24-7, just call the main number to the clinic [phone] and do not press any options, hold on the line and a nurse will answer the phone.    For prescription refill requests, have your pharmacy contact our office and  allow 72 hours.    Due to Covid, you will need to wear a mask upon entering the hospital. If you do not have a mask, a mask will be given to you at the Main Entrance upon arrival. For doctor visits, patients may have 1 support person age 53 or older with them. For treatment visits, patients can not have anyone with them due to social distancing guidelines and our immunocompromised population.

## 2023-09-21 NOTE — Therapy (Addendum)
OUTPATIENT SPEECH LANGUAGE PATHOLOGY ONCOLOGY EVALUATION   Patient Name: Bryan Fleming MRN: [id number] DOB:26-Feb-1961, 63 y.o., male Today's Date: 09/21/2023  PCP: Benetta Spar, MD REFERRING PROVIDER: Lonie Peak, MD  END OF SESSION:  End of Session - 09/21/23 1119     Visit Number 2    Number of Visits 7    Date for SLP Re-Evaluation 11/08/23    Authorization Type [employer]    SLP Start Time 1106    SLP Stop Time  1145    SLP Time Calculation (min) 39 min    Activity Tolerance Patient tolerated treatment well             Past Medical History:  Diagnosis Date   Cancer (HCC)    Lung cancer   COPD (chronic obstructive pulmonary disease) (HCC) 2007   EMPHYSEMA   Depression    Dyspnea    occasional - no oxygen   GERD (gastroesophageal reflux disease) 2010   Hepatitis    Alcoholic    HLD (hyperlipidemia)    no meds   Hypertension    Port-A-Cath in place 01/01/2021   Past Surgical History:  Procedure Laterality Date   BIOPSY  05/15/2023   Procedure: BIOPSY;  Surgeon: Lemar Lofty., MD;  Location: [facility];  Service: Gastroenterology;;   BRONCHIAL BIOPSY  12/23/2020   Procedure: BRONCHIAL BIOPSIES;  Surgeon: Leslye Peer, MD;  Location: [facility] ENDOSCOPY;  Service: Pulmonary;;   BRONCHIAL BRUSHINGS  12/23/2020   Procedure: BRONCHIAL BRUSHINGS;  Surgeon: Leslye Peer, MD;  Location: [facility] ENDOSCOPY;  Service: Pulmonary;;   BRONCHIAL NEEDLE ASPIRATION BIOPSY  12/23/2020   Procedure: BRONCHIAL NEEDLE ASPIRATION BIOPSIES;  Surgeon: Leslye Peer, MD;  Location: [facility] ENDOSCOPY;  Service: Pulmonary;;   BRONCHIAL WASHINGS  12/23/2020   Procedure: BRONCHIAL WASHINGS;  Surgeon: Leslye Peer, MD;  Location: [facility] ENDOSCOPY;  Service: Pulmonary;;   CARDIAC CATHETERIZATION  2010   COLONOSCOPY  03/14/2012   Procedure: COLONOSCOPY;  Surgeon: Malissa Hippo, MD;  Location: [facility];  Service: Endoscopy;  Laterality: N/A;  100    ESOPHAGOGASTRODUODENOSCOPY (EGD) WITH PROPOFOL N/A 05/15/2023   Procedure: ESOPHAGOGASTRODUODENOSCOPY (EGD) WITH PROPOFOL;  Surgeon: Meridee Score Netty Starring., MD;  Location: [facility] ENDOSCOPY;  Service: Gastroenterology;  Laterality: N/A;   EUS N/A 05/15/2023   Procedure: UPPER ENDOSCOPIC ULTRASOUND (EUS) RADIAL;  Surgeon: Lemar Lofty., MD;  Location: [facility] ENDOSCOPY;  Service: Gastroenterology;  Laterality: N/A;   INTERCOSTAL NERVE BLOCK  04/21/2021   Procedure: INTERCOSTAL NERVE BLOCK;  Surgeon: Loreli Slot, MD;  Location: [facility] OR;  Service: Thoracic;;   IR GASTROSTOMY TUBE MOD SED  07/27/2023   IR IMAGING GUIDED PORT INSERTION  01/12/2021   LARYNGOSCOPY N/A 07/04/2023   Procedure: LARYNGOSCOPY WITH BIOPSIES;  Surgeon: Laren Boom, DO;  Location: [facility];  Service: ENT;  Laterality: N/A;   LUNG REMOVAL, PARTIAL  08/2021   PENILE PROSTHESIS IMPLANT N/A 02/17/2022   Procedure: PENILE PROTHESIS INFLATABLE;  Surgeon: [name], MD;  Location: [facility]S;  Service: Urology;  Laterality: N/A;   PORTACATH PLACEMENT  07/2021   REMOVAL OF PENILE PROSTHESIS N/A 10/13/2022   Procedure: REMOVAL OF PENILE PROSTHESIS;  Surgeon: [name], MD;  Location: [facility];  Service: Urology;  Laterality: N/A;   REMOVAL OF PENILE PROSTHESIS N/A 12/05/2022   Procedure: REMOVAL AND REPLACEMENT OF PENILE PROSTHESIS;  Surgeon: [name], MD;  Location: [facility] ORS;  Service: Urology;  Laterality: N/A;  120  MINUTES NEEDED FOR CASE   VIDEO ASSISTED THORACOSCOPY (VATS)/ LOBECTOMY Left 04/21/2021   Procedure: VIDEO ASSISTED THORACOSCOPY (VATS)/ LOBECTOMY;  Surgeon: Loreli Slot, MD;  Location: [facility] OR;  Service: Thoracic;  Laterality: Left;   VIDEO BRONCHOSCOPY WITH ENDOBRONCHIAL NAVIGATION N/A 12/23/2020   Procedure: VIDEO BRONCHOSCOPY WITH ENDOBRONCHIAL NAVIGATION;  Surgeon: Leslye Peer, MD;  Location: [facility] ENDOSCOPY;  Service: Pulmonary;  Laterality: N/A;    WRIST GANGLION EXCISION  1985   Patient Active Problem List   Diagnosis Date Noted   Hypomagnesemia 09/08/2023   Leukopenia due to antineoplastic chemotherapy (HCC) 09/01/2023   Chemotherapy-induced thrombocytopenia 09/01/2023   Flush 07/28/2023   Cancer associated pain 07/24/2023   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) 07/19/2023   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   Lesion of larynx 07/04/2023   Dilation of pancreatic duct 05/15/2023   Multiple gastric ulcers 05/15/2023   Gastritis and gastroduodenitis 05/15/2023   Wound dehiscence 12/01/2022   Malfunction of penile prosthesis (HCC) 10/13/2022   Erectile dysfunction 02/17/2022   Lung mass 04/21/2021   S/P Left Video Assisted Thorascopy with Left Upper Lobectomy, Lypmh Node Dissection, Intercostal Nerve block 04/21/2021   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   Joint pain 02/03/2021   Nausea without vomiting 02/03/2021   Primary lung squamous cell carcinoma, left (HCC) 01/01/2021   Port-A-Cath in place 01/01/2021   Cavitating mass in left upper lung lobe 12/17/2020   Acute pancreatitis 06/04/2020   Leukocytosis 06/04/2020   Hypokalemia 06/04/2020   Elevated liver function tests 02/02/2016   Absolute anemia 02/02/2016   Alcohol abuse 08/30/2015   Situational anxiety 08/30/2015   Nicotine dependence, cigarettes, with other nicotine-induced disorders 08/30/2015   Hepatitis 08/31/2011   Hypertension    GERD (gastroesophageal reflux disease)    COPD (chronic obstructive pulmonary disease) (HCC)     ONSET DATE: October 2024   REFERRING DIAG:  C32.8 (ICD-10-CM) - 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 He presented for an EGD/EUS on 05/15/23 to evaluate a possible ampullary process. Procedural findings incidentally revealed a possible mass/lesion on the vallecular fold and corniculate cartilage.06/14/23 He was seen by Dr. Marene Lenz for further evaluation and management. During which time, the patient endorsed mild trouble swallowing and a slight change in his voice. Laryngoscopy performed at that time revealed a exophytic mass lesion involving the right false vocal fold/arytenoid/aryepiglottic fold, partially obscuring the view of the right true cord, but with vocal fold mobility preserved. Physical exam performed was also notable for a small palpable lymph node in the right neck which was non-enlarged. 06/19/23 CT neck completed which revealed the 2.4 cm right supraglottic mass consistent with malignancy; pathologic right level III lymph node consistent with metastatic disease; asymmetric  soft tissue in the left lateral upper oropharynx, indeterminate for a second mucosal malignancy; and a new 11 mm subsolid pulmonary nodule in the right upper lobe with additional smaller peribronchovascular ground-glass densities elsewhere in the right upper lobe, which most likely correlate with an infectious / inflammatory etiology. 07/04/23 He underwent a Laryngoscopy with biopsies of the right tonsil, left  soft palate, right aryepiglottic, right aryepiglottic, right arytenoid, and post cricoid. Pathology showed moderately to poorly differentiated SCC in all the biopsy samples, p16 -. Operative findings per Dr. Marene Lenz: Exophytic mucosal mass involving the right arytenoid, aryepiglottic fold, and post-cricoid area. Extension to vallecula noted, but no obvious mucosal abnormality of the epiglottis, false cords, or true cords. Firm, abnormal mucosal  changes of the soft palate, extending from the right anterior/posterior pillars to the left soft palate. Uvula absent. Firm palpable nodularity of the right tonsil. 07/12/24 PET which showed: hypermetabolic right sided supraglottic mass consistent with the patient's known primary malignancy. Pet also showed: hypermetabolic adjacent right level III lymph node consistent with metastatic disease; and stable surgical changes from the left upper lobectomy. PET otherwise showed no evidence of distant metastatic disease involving the chest, abdomen, pelvis, or bony structures.  To my eye there is low-level hypermetabolic activity in the oropharynx and asymmetry in the oropharynx on his PET/CT. Radiation and Medical oncology consult on 12/11. He will receive chemotherapy and radiation. Treatment plan: He will receive 35 fractions radiation to his Laryngopharynx and bilateral neck with weekly chemotherapy. Treatment started 12/23 and will complete 09/19/23. Pretreatment procedures: 07/27/23 placed PEG/PAC.  PAIN:  Are you having pain? Yes: NPRS scale: 9/10 Pain location: throat Pain description: sore,  Aggravating factors: coldness Relieving factors: medicine- oxycodone, lidocaine  FALLS: Has patient fallen in last 6 months?  See PT evaluation for details  LIVING ENVIRONMENT: Lives with: lives with their family Lives in: House/apartment  PLOF:  Level of assistance: Independent with ADLs, Independent with IADLs Employment: Full-time employment  PATIENT GOALS: Continue with WNL swallowing following ChRT  OBJECTIVE:  Note: Objective measures were completed at Evaluation unless otherwise noted.  COGNITION: Overall cognitive status: Within functional limits for tasks assessed  LANGUAGE: Receptive and Expressive language appeared WNL.  ORAL MOTOR EXAMINATION: Overall status: WFL  MOTOR SPEECH: Overall motor speech: Appears intact  SUBJECTIVE DYSPHAGIA REPORTS:  Date of onset: first week of  December Reported symptoms:  reduced pharyngeal clearance with foods that aren't mushy/soft ; SLP suspects pt will eventually be PEG dependent over the next 4 weeks  Current diet: Dysphagia 3 (mechanical soft) and thin liquids  Co-morbid voice changes: No  FACTORS WHICH MAY INCREASE RISK OF ADVERSE EVENT IN PRESENCE OF ASPIRATION:  General health: well appearing  Risk factors: none evident  however undergoing ChRT for supraglottic CA  CLINICAL SWALLOW ASSESSMENT:   Dentition: adequate natural dentition Vocal quality at baseline: normal Patient directly observed with POs: Yes: dysphagia 3 (soft) and thin liquids  Feeding: able to feed self Liquids provided by:  bottle Oral phase signs and symptoms:  none noted today Pharyngeal phase signs and symptoms:  none noted today                                                                                                                            TREATMENT DATE:   08/10/23 (eval): Research states  the risk for dysphagia increases due to radiation and/or chemotherapy treatment due to a variety of factors, so SLP educated the pt about the possibility of reduced/limited ability for PO intake during rad tx. SLP also educated pt regarding possible changes to swallowing musculature after rad tx, and why adherence to dysphagia HEP provided today and PO consumption was necessary to inhibit muscle fibrosis following rad tx and to mitigate muscle disuse atrophy. SLP informed pt why this would be detrimental to their swallowing status and to their pulmonary health. Pt demonstrated understanding of these things to SLP. SLP encouraged pt to safely eat and drink as deep into their radiation/chemotherapy as possible to provide the best possible long-term swallowing outcome for pt.  SLP then developed an individualized HEP for pt involving oral and pharyngeal strengthening and ROM and pt was instructed how to perform these exercises, including SLP demonstration. After  SLP demonstration, pt return demonstrated each exercise. SLP ensured pt performance was correct prior to educating pt on next exercise. Pt required usual mod cues faded to modified independent to perform HEP. Pt was instructed to complete this program 6-7 days/week, at least 20 reps a day (I sets of not <5) until 6 months after his last day of rad tx, and then x2 a week after that, indefinitely. Among other modifications for days when pt cannot functionally swallow, SLP also suggested pt to perform only non-swallowing tasks on the handout/HEP, and if necessary to cycle through the swallowing portion so the full program of exercises can be completed instead of fatiguing on one of the swallowing exercises and being unable to perform the other swallowing exercises. SLP instructed that swallowing exercises should then be added back into the regimen as pt is able to do so. Secondly, pt was told that former patients have told SLP that during their course of radiation therapy, taking prescribed pain medication just prior to performing HEP (and eating/drinking) has proven helpful in completing HEP (and eating and drinking) more regularly when going through their course of radiation treatment.   09/21/23: Pt seen for ongoing dysphagia intervention following clinical swallow evaluation on 08/10/2023. Pt has since completed chemoradiation (09/19/2023). He reports odynophagia. He is able to eat pureed foods (mashed potato, yogurt, applesauce) and has 3 cartons of Osmolite a day. He is drinking 2 Boost/Ensure a day as well as a bottle of water a day. Side effects from radiation include burning in the neck and decreased taste. Pt continues with +oral thrush on the tongue, soft palate, and back of the throat. MD aware and treating. RD, Joli Alen recommended "patient increase tube feeding to 4 cartons of osmolite 1.5 daily.  Flush with 60ml of water before and after each feeding. Drink two 350 calorie shakes orally.  If unable to  drink shakes orally will need to give additional 2 cartons of osmolite 1.5 via feeding tube. Pt was given swallowing exercises previously, but he states that he has not been completing. SLP reviewed exercises to complete: effortful swallow, Masaka, Mendelsohn, modified chin tuck against resistance with swallow, and lingual sweep for ROM. Pt able to complete with direct modeling from SLP and will need additional follow up. Pt observed with 2 oz puree and thin water by mouth. Pt with reports of globus and discomfort with swallows and it took him an excessive amount of time to consume limited amount. Pt would benefit from MBSS to visualize swallow to help determine appropriate diet, modifications as needed, strategies, and provided visual feedback for the patient.  Pt agreeable to MBSS and HEP. Pt also given a journal to record PO intake going forward and encouraged to increase Ensure to 3 per day since he appears to tolerate liquids over solids. EAT-10 administered and Pt scored 35. See below. SLP sent request for MBSS.     EATING ASSESSMENT TOOL (EAT-10) (Given on 09/21/2023)  The patient was asked to rate to what extent the following statements are problematic on a scale of 0-4. 0 = No problem; 4 = Severe problem. A total score of 3 or higher is considered abnormal. My swallowing problem has caused me to lose weight. 3  My swallowing problem interferes with my ability to go out for meals. 4  Swallowing liquids takes extra effort. 3 Swallowing solids takes extra effort. 4  Swallowing pills takes extra effort. 4 Swallowing is painful. 4  The pleasure of eating is affected by my swallowing. 4  When I swallow food sticks in my throat. 2  I cough when I eat. 3 Swallowing is stressful. 4  TOTAL SCORE: 35 [X]  Abnormal (raw score >3) []  Normal    PATIENT EDUCATION: Education details: late effects head/neck radiation on swallow function, HEP procedure, and modification to HEP when difficulty experienced  with swallowing during and after radiation course Person educated: Patient Education method: Explanation, Demonstration, Verbal cues, and Handouts Education comprehension: verbalized understanding, returned demonstration, verbal cues required, and needs further education   ASSESSMENT:  CLINICAL IMPRESSION: (from initial evaluation 08/10/23) Patient is a 63 y.o. M who was seen today for assessment of swallowing as they undergo radiation/chemoradiation therapy. Today pt ate [place] sandwich and drank thin liquids without overt s/s oral or pharyngeal difficulty, however he reports s/sx of pharyngeal dysphagia with foods that are more of a regular diet consistency, c/b reduced pharyngeal clearance and thus need to engage in multiple swallows. At this time pt swallowing is deemed WNL/WFL with Dys III items, and thin liquids. No s/sx oral dysphagia were noted, nor were s/sx of aspiration observed. There are no overt s/s aspiration PNA observed by SLP nor any reported by pt at this time. As swallowing becomes more difficult during his ChRT, he has the option of using his PEG tube for primary nutrition and hydration. Data indicate that pt's swallow ability will likely decrease over the course of radiation/chemoradiation therapy and could very well decline over time following the conclusion of that therapy due to muscle disuse atrophy and/or muscle fibrosis. Pt will cont to need to be seen by SLP in order to assess safety of PO intake, assess the need for recommending any objective swallow assessment, and ensuring pt is correctly completing the individualized HEP.  OBJECTIVE IMPAIRMENTS: include dysphagia. These impairments are limiting patient from safety when swallowing. Factors affecting potential to achieve goals and functional outcome are  none noted today . Patient will benefit from skilled SLP services to address above impairments and improve overall function.  REHAB POTENTIAL: Good  GOALS: Goals  reviewed with patient? Yes SHORT TERM GOALS: Target: 3rd total session   1. Pt will compelte HEP with modified independence in 2 sessions Baseline: Goal status: ONGOING   2.  pt will tell SLP why pt is completing HEP with modified independence Baseline:  Goal status: ONGOING   3.  pt will describe 3 overt s/s aspiration PNA with modified independence Baseline:  Goal status: ONGOING   4.  pt will tell SLP how a food journal could hasten return to a more normalized diet Baseline:  Goal status:  ONGOING     LONG TERM GOALS: Target: 7th total session   1.  pt will complete HEP with independence over two visits Baseline:  Goal status: ONGOINGL   2.  pt will describe how to modify HEP over time, and the timeline associated with reduction in HEP frequency with modified independence over two sessions Baseline:  Goal status: ONGOING   PLAN:   SLP FREQUENCY:  once per week, Pt will also need MBSS   SLP DURATION:  7 sessions   PLANNED INTERVENTIONS: Aspiration precaution training, Pharyngeal strengthening exercises, Diet toleration management , Trials of upgraded texture/liquids, SLP instruction and feedback, Compensatory strategies, and Patient/family education, MBSS    Thank you,  Havery Moros, CCC-SLP [phone]  Tasmia Blumer, CCC-SLP 09/21/2023, 11:50 AM

## 2023-09-23 ENCOUNTER — Other Ambulatory Visit: Payer: Self-pay

## 2023-09-26 ENCOUNTER — Other Ambulatory Visit (HOSPITAL_COMMUNITY): Payer: Self-pay | Admitting: Occupational Therapy

## 2023-09-26 ENCOUNTER — Encounter (HOSPITAL_COMMUNITY): Payer: Self-pay | Admitting: Speech Pathology

## 2023-09-26 ENCOUNTER — Ambulatory Visit (HOSPITAL_COMMUNITY): Payer: Medicare HMO | Admitting: Speech Pathology

## 2023-09-26 DIAGNOSIS — C051 Malignant neoplasm of soft palate: Secondary | ICD-10-CM | POA: Diagnosis not present

## 2023-09-26 DIAGNOSIS — R1312 Dysphagia, oropharyngeal phase: Secondary | ICD-10-CM

## 2023-09-26 DIAGNOSIS — C328 Malignant neoplasm of overlapping sites of larynx: Secondary | ICD-10-CM | POA: Diagnosis not present

## 2023-09-26 DIAGNOSIS — C321 Malignant neoplasm of supraglottis: Secondary | ICD-10-CM | POA: Diagnosis not present

## 2023-09-26 DIAGNOSIS — C760 Malignant neoplasm of head, face and neck: Secondary | ICD-10-CM

## 2023-09-26 NOTE — Therapy (Signed)
OUTPATIENT SPEECH LANGUAGE PATHOLOGY ONCOLOGY EVALUATION   Patient Name: Bryan Fleming MRN: [id number] DOB:15-Jul-1961, 63 y.o., male Today's Date: 09/26/2023  PCP: Benetta Spar, MD REFERRING PROVIDER: Lonie Peak, MD  END OF SESSION:  End of Session - 09/26/23 1454     Visit Number 3    Number of Visits 7    Date for SLP Re-Evaluation 11/08/23    Authorization Type [employer]    SLP Start Time 1445    SLP Stop Time  1520    SLP Time Calculation (min) 35 min    Activity Tolerance Patient tolerated treatment well             Past Medical History:  Diagnosis Date   Cancer (HCC)    Lung cancer   COPD (chronic obstructive pulmonary disease) (HCC) 2007   EMPHYSEMA   Depression    Dyspnea    occasional - no oxygen   GERD (gastroesophageal reflux disease) 2010   Hepatitis    Alcoholic    HLD (hyperlipidemia)    no meds   Hypertension    Port-A-Cath in place 01/01/2021   Past Surgical History:  Procedure Laterality Date   BIOPSY  05/15/2023   Procedure: BIOPSY;  Surgeon: Lemar Lofty., MD;  Location: [facility] ENDOSCOPY;  Service: Gastroenterology;;   BRONCHIAL BIOPSY  12/23/2020   Procedure: BRONCHIAL BIOPSIES;  Surgeon: Leslye Peer, MD;  Location: [facility] ENDOSCOPY;  Service: Pulmonary;;   BRONCHIAL BRUSHINGS  12/23/2020   Procedure: BRONCHIAL BRUSHINGS;  Surgeon: Leslye Peer, MD;  Location: [facility] ENDOSCOPY;  Service: Pulmonary;;   BRONCHIAL NEEDLE ASPIRATION BIOPSY  12/23/2020   Procedure: BRONCHIAL NEEDLE ASPIRATION BIOPSIES;  Surgeon: Leslye Peer, MD;  Location: [facility] ENDOSCOPY;  Service: Pulmonary;;   BRONCHIAL WASHINGS  12/23/2020   Procedure: BRONCHIAL WASHINGS;  Surgeon: Leslye Peer, MD;  Location: [facility] ENDOSCOPY;  Service: Pulmonary;;   CARDIAC CATHETERIZATION  2010   COLONOSCOPY  03/14/2012   Procedure: COLONOSCOPY;  Surgeon: Malissa Hippo, MD;  Location: [facility] ENDO SUITE;  Service: Endoscopy;  Laterality: N/A;  100    ESOPHAGOGASTRODUODENOSCOPY (EGD) WITH PROPOFOL N/A 05/15/2023   Procedure: ESOPHAGOGASTRODUODENOSCOPY (EGD) WITH PROPOFOL;  Surgeon: Meridee Score Netty Starring., MD;  Location: [facility] ENDOSCOPY;  Service: Gastroenterology;  Laterality: N/A;   EUS N/A 05/15/2023   Procedure: UPPER ENDOSCOPIC ULTRASOUND (EUS) RADIAL;  Surgeon: Lemar Lofty., MD;  Location: [facility] ENDOSCOPY;  Service: Gastroenterology;  Laterality: N/A;   INTERCOSTAL NERVE BLOCK  04/21/2021   Procedure: INTERCOSTAL NERVE BLOCK;  Surgeon: Loreli Slot, MD;  Location: [facility] OR;  Service: Thoracic;;   IR GASTROSTOMY TUBE MOD SED  07/27/2023   IR IMAGING GUIDED PORT INSERTION  01/12/2021   LARYNGOSCOPY N/A 07/04/2023   Procedure: LARYNGOSCOPY WITH BIOPSIES;  Surgeon: Laren Boom, DO;  Location: [facility];  Service: ENT;  Laterality: N/A;   LUNG REMOVAL, PARTIAL  08/2021   PENILE PROSTHESIS IMPLANT N/A 02/17/2022   Procedure: PENILE PROTHESIS INFLATABLE;  Surgeon: [name], MD;  Location: [facility] ORS;  Service: Urology;  Laterality: N/A;   PORTACATH PLACEMENT  07/2021   REMOVAL OF PENILE PROSTHESIS N/A 10/13/2022   Procedure: REMOVAL OF PENILE PROSTHESIS;  Surgeon: [name], MD;  Location: [facility] ORS;  Service: Urology;  Laterality: N/A;   REMOVAL OF PENILE PROSTHESIS N/A 12/05/2022   Procedure: REMOVAL AND REPLACEMENT OF PENILE PROSTHESIS;  Surgeon: [name], MD;  Location: [facility] ORS;  Service: Urology;  Laterality: N/A;  120  MINUTES NEEDED FOR CASE   VIDEO ASSISTED THORACOSCOPY (VATS)/ LOBECTOMY Left 04/21/2021   Procedure: VIDEO ASSISTED THORACOSCOPY (VATS)/ LOBECTOMY;  Surgeon: Loreli Slot, MD;  Location: [facility] OR;  Service: Thoracic;  Laterality: Left;   VIDEO BRONCHOSCOPY WITH ENDOBRONCHIAL NAVIGATION N/A 12/23/2020   Procedure: VIDEO BRONCHOSCOPY WITH ENDOBRONCHIAL NAVIGATION;  Surgeon: Leslye Peer, MD;  Location: [facility] ENDOSCOPY;  Service: Pulmonary;  Laterality: N/A;    WRIST GANGLION EXCISION  1985   Patient Active Problem List   Diagnosis Date Noted   Hypomagnesemia 09/08/2023   Leukopenia due to antineoplastic chemotherapy (HCC) 09/01/2023   Chemotherapy-induced thrombocytopenia 09/01/2023   Flush 07/28/2023   Cancer associated pain 07/24/2023   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) 07/19/2023   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   Lesion of larynx 07/04/2023   Dilation of pancreatic duct 05/15/2023   Multiple gastric ulcers 05/15/2023   Gastritis and gastroduodenitis 05/15/2023   Wound dehiscence 12/01/2022   Malfunction of penile prosthesis (HCC) 10/13/2022   Erectile dysfunction 02/17/2022   Lung mass 04/21/2021   S/P Left Video Assisted Thorascopy with Left Upper Lobectomy, Lypmh Node Dissection, Intercostal Nerve block 04/21/2021   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   Joint pain 02/03/2021   Nausea without vomiting 02/03/2021   Primary lung squamous cell carcinoma, left (HCC) 01/01/2021   Port-A-Cath in place 01/01/2021   Cavitating mass in left upper lung lobe 12/17/2020   Acute pancreatitis 06/04/2020   Leukocytosis 06/04/2020   Hypokalemia 06/04/2020   Elevated liver function tests 02/02/2016   Absolute anemia 02/02/2016   Alcohol abuse 08/30/2015   Situational anxiety 08/30/2015   Nicotine dependence, cigarettes, with other nicotine-induced disorders 08/30/2015   Hepatitis 08/31/2011   Hypertension    GERD (gastroesophageal reflux disease)    COPD (chronic obstructive pulmonary disease) (HCC)     ONSET DATE: October 2024   REFERRING DIAG:  C32.8 (ICD-10-CM) - 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 He presented for an EGD/EUS on 05/15/23 to evaluate a possible ampullary process. Procedural findings incidentally revealed a possible mass/lesion on the vallecular fold and corniculate cartilage.06/14/23 He was seen by Dr. Marene Lenz for further evaluation and management. During which time, the patient endorsed mild trouble swallowing and a slight change in his voice. Laryngoscopy performed at that time revealed a exophytic mass lesion involving the right false vocal fold/arytenoid/aryepiglottic fold, partially obscuring the view of the right true cord, but with vocal fold mobility preserved. Physical exam performed was also notable for a small palpable lymph node in the right neck which was non-enlarged. 06/19/23 CT neck completed which revealed the 2.4 cm right supraglottic mass consistent with malignancy; pathologic right level III lymph node consistent with metastatic disease; asymmetric  soft tissue in the left lateral upper oropharynx, indeterminate for a second mucosal malignancy; and a new 11 mm subsolid pulmonary nodule in the right upper lobe with additional smaller peribronchovascular ground-glass densities elsewhere in the right upper lobe, which most likely correlate with an infectious / inflammatory etiology. 07/04/23 He underwent a Laryngoscopy with biopsies of the right tonsil, left soft palate, right aryepiglottic, right  aryepiglottic, right arytenoid, and post cricoid. Pathology showed moderately to poorly differentiated SCC in all the biopsy samples, p16 -. Operative findings per Dr. Marene Lenz: Exophytic mucosal mass involving the right arytenoid, aryepiglottic fold, and post-cricoid area. Extension to vallecula noted, but no obvious mucosal abnormality of the epiglottis, false cords, or true cords. Firm, abnormal mucosal changes of the soft palate, extending from  the right anterior/posterior pillars to the left soft palate. Uvula absent. Firm palpable nodularity of the right tonsil. 07/12/24 PET which showed: hypermetabolic right sided supraglottic mass consistent with the patient's known primary malignancy. Pet also showed: hypermetabolic adjacent right level III lymph node consistent with metastatic disease; and stable surgical changes from the left upper lobectomy. PET otherwise showed no evidence of distant metastatic disease involving the chest, abdomen, pelvis, or bony structures.  To my eye there is low-level hypermetabolic activity in the oropharynx and asymmetry in the oropharynx on his PET/CT. Radiation and Medical oncology consult on 12/11. He will receive chemotherapy and radiation. Treatment plan: He will receive 35 fractions radiation to his Laryngopharynx and bilateral neck with weekly chemotherapy. Treatment started 12/23 and will complete 09/19/23. Pretreatment procedures: 07/27/23 placed PEG/PAC.  PAIN:  Are you having pain? Yes: NPRS scale: 9/10 Pain location: throat Pain description: sore,  Aggravating factors: coldness Relieving factors: medicine- oxycodone, lidocaine  FALLS: Has patient fallen in last 6 months?  See PT evaluation for details  LIVING ENVIRONMENT: Lives with: lives with their family Lives in: House/apartment  PLOF:  Level of assistance: Independent with ADLs, Independent with IADLs Employment: Full-time employment  PATIENT GOALS: Continue with WNL swallowing following ChRT  OBJECTIVE:  Note: Objective measures were completed at Evaluation unless otherwise noted.  COGNITION: Overall cognitive status: Within functional limits for tasks assessed  LANGUAGE: Receptive and Expressive language appeared WNL.  ORAL MOTOR EXAMINATION: Overall status: WFL  MOTOR SPEECH: Overall motor speech: Appears intact  SUBJECTIVE DYSPHAGIA REPORTS:  Date of onset: first week of December Reported symptoms:  reduced pharyngeal  clearance with foods that aren't mushy/soft ; SLP suspects pt will eventually be PEG dependent over the next 4 weeks  Current diet: Dysphagia 3 (mechanical soft) and thin liquids  Co-morbid voice changes: No  FACTORS WHICH MAY INCREASE RISK OF ADVERSE EVENT IN PRESENCE OF ASPIRATION:  General health: well appearing  Risk factors: none evident  however undergoing ChRT for supraglottic CA  CLINICAL SWALLOW ASSESSMENT:   Dentition: adequate natural dentition Vocal quality at baseline: normal Patient directly observed with POs: Yes: dysphagia 3 (soft) and thin liquids  Feeding: able to feed self Liquids provided by:  bottle Oral phase signs and symptoms:  none noted today Pharyngeal phase signs and symptoms:  none noted today                                                                                                                            TREATMENT DATE:   08/10/23 (eval): Research states the risk for dysphagia increases  due to radiation and/or chemotherapy treatment due to a variety of factors, so SLP educated the pt about the possibility of reduced/limited ability for PO intake during rad tx. SLP also educated pt regarding possible changes to swallowing musculature after rad tx, and why adherence to dysphagia HEP provided today and PO consumption was necessary to inhibit muscle fibrosis following rad tx and to mitigate muscle disuse atrophy. SLP informed pt why this would be detrimental to their swallowing status and to their pulmonary health. Pt demonstrated understanding of these things to SLP. SLP encouraged pt to safely eat and drink as deep into their radiation/chemotherapy as possible to provide the best possible long-term swallowing outcome for pt.  SLP then developed an individualized HEP for pt involving oral and pharyngeal strengthening and ROM and pt was instructed how to perform these exercises, including SLP demonstration. After SLP demonstration, pt return demonstrated each  exercise. SLP ensured pt performance was correct prior to educating pt on next exercise. Pt required usual mod cues faded to modified independent to perform HEP. Pt was instructed to complete this program 6-7 days/week, at least 20 reps a day (I sets of not <5) until 6 months after his last day of rad tx, and then x2 a week after that, indefinitely. Among other modifications for days when pt cannot functionally swallow, SLP also suggested pt to perform only non-swallowing tasks on the handout/HEP, and if necessary to cycle through the swallowing portion so the full program of exercises can be completed instead of fatiguing on one of the swallowing exercises and being unable to perform the other swallowing exercises. SLP instructed that swallowing exercises should then be added back into the regimen as pt is able to do so. Secondly, pt was told that former patients have told SLP that during their course of radiation therapy, taking prescribed pain medication just prior to performing HEP (and eating/drinking) has proven helpful in completing HEP (and eating and drinking) more regularly when going through their course of radiation treatment.   Previous Session: Pt seen for ongoing dysphagia intervention following clinical swallow evaluation on 08/10/2023. Pt has since completed chemoradiation (09/19/2023). He reports odynophagia. He is able to eat pureed foods (mashed potato, yogurt, applesauce) and has 3 cartons of Osmolite a day. He is drinking 2 Boost/Ensure a day as well as a bottle of water a day. Side effects from radiation include burning in the neck and decreased taste. Pt continues with +oral thrush on the tongue, soft palate, and back of the throat. MD aware and treating. RD, Joli Alen recommended "patient increase tube feeding to 4 cartons of osmolite 1.5 daily.  Flush with 60ml of water before and after each feeding. Drink two 350 calorie shakes orally.  If unable to drink shakes orally will need to give  additional 2 cartons of osmolite 1.5 via feeding tube. Pt was given swallowing exercises previously, but he states that he has not been completing. SLP reviewed exercises to complete: effortful swallow, Masaka, Mendelsohn, modified chin tuck against resistance with swallow, and lingual sweep for ROM. Pt able to complete with direct modeling from SLP and will need additional follow up. Pt observed with 2 oz puree and thin water by mouth. Pt with reports of globus and discomfort with swallows and it took him an excessive amount of time to consume limited amount. Pt would benefit from MBSS to visualize swallow to help determine appropriate diet, modifications as needed, strategies, and provided visual feedback for the patient. Pt agreeable to MBSS  and HEP. Pt also given a journal to record PO intake going forward and encouraged to increase Ensure to 3 per day since he appears to tolerate liquids over solids. EAT-10 administered and Pt scored 35. See below. SLP sent request for MBSS.  Current Session: Pt seen for ongoing dysphagia intervention. He did not bring his folder back with exercises and food log. He stated that he didn't fill it out because he doesn't eat much by mouth. He continues to report dysgeusia and some odynophagia to the lateral aspects of his tongue and cheeks. He is still being treated for oral candidae. He indicated that he is not taking the carafate because he is unable to swallow the pill (it was suggested that he dissolve in a small amount of water to take). He is able to take his pain medication by mouth. He uses the [employer] "a few times a day" and Pt was encouraged to use it before meals to dull the pain with swallow. He also stated that he does not have a regular schedule for taking Ensure or PEG feedings. He is taking 3 cartons of Osmolite a day, but gave two cartons at one time and felt distended and ill. With assist from Pt, SLP developed the following routine which was provided  in written form for Pt: 7am Ensure, 8am PEG, 11am Ensure, 1pm PEG, 6pm Ensure, and 8pm PEG. He acknowledges that the RD wanted him to increase Osmolite to 4 a day or add an Ensure (he has not done either). He was also encouraged to add 2-3 servings of puree (mashed potato, yogurt, applesauce) per day. He consumed a few bites of diced peaches and sips of water in session today. He reports "pain" when swallowing pieces of peaches. Hopefully, the oral pain will diminish over the next week and he can start to consume more by mouth. MBSS arranged for April 14 with possibility of moving sooner if schedule opens up. Pt encouraged to continue with swallowing exercises at home and continue PO.  09/26/23:      EATING ASSESSMENT TOOL (EAT-10) (Given on 09/21/2023)  The patient was asked to rate to what extent the following statements are problematic on a scale of 0-4. 0 = No problem; 4 = Severe problem. A total score of 3 or higher is considered abnormal. My swallowing problem has caused me to lose weight. 3  My swallowing problem interferes with my ability to go out for meals. 4  Swallowing liquids takes extra effort. 3 Swallowing solids takes extra effort. 4  Swallowing pills takes extra effort. 4 Swallowing is painful. 4  The pleasure of eating is affected by my swallowing. 4  When I swallow food sticks in my throat. 2  I cough when I eat. 3 Swallowing is stressful. 4  TOTAL SCORE: 35 [X]  Abnormal (raw score >3) []  Normal    PATIENT EDUCATION: Education details: late effects head/neck radiation on swallow function, HEP procedure, and modification to HEP when difficulty experienced with swallowing during and after radiation course Person educated: Patient Education method: Explanation, Demonstration, Verbal cues, and Handouts Education comprehension: verbalized understanding, returned demonstration, verbal cues required, and needs further education   ASSESSMENT:  CLINICAL IMPRESSION: (from  initial evaluation 08/10/23) Patient is a 63 y.o. M who was seen today for assessment of swallowing as they undergo radiation/chemoradiation therapy. Today pt ate [place] sandwich and drank thin liquids without overt s/s oral or pharyngeal difficulty, however he reports s/sx of pharyngeal dysphagia with foods that are  more of a regular diet consistency, c/b reduced pharyngeal clearance and thus need to engage in multiple swallows. At this time pt swallowing is deemed WNL/WFL with Dys III items, and thin liquids. No s/sx oral dysphagia were noted, nor were s/sx of aspiration observed. There are no overt s/s aspiration PNA observed by SLP nor any reported by pt at this time. As swallowing becomes more difficult during his ChRT, he has the option of using his PEG tube for primary nutrition and hydration. Data indicate that pt's swallow ability will likely decrease over the course of radiation/chemoradiation therapy and could very well decline over time following the conclusion of that therapy due to muscle disuse atrophy and/or muscle fibrosis. Pt will cont to need to be seen by SLP in order to assess safety of PO intake, assess the need for recommending any objective swallow assessment, and ensuring pt is correctly completing the individualized HEP.  OBJECTIVE IMPAIRMENTS: include dysphagia. These impairments are limiting patient from safety when swallowing. Factors affecting potential to achieve goals and functional outcome are  none noted today . Patient will benefit from skilled SLP services to address above impairments and improve overall function.  REHAB POTENTIAL: Good  GOALS: Goals reviewed with patient? Yes SHORT TERM GOALS: Target: 3rd total session   1. Pt will compelte HEP with modified independence in 2 sessions Baseline: Goal status: ONGOING   2.  pt will tell SLP why pt is completing HEP with modified independence Baseline:  Goal status: ONGOING   3.  pt will describe 3 overt s/s  aspiration PNA with modified independence Baseline:  Goal status: ONGOING   4.  pt will tell SLP how a food journal could hasten return to a more normalized diet Baseline:  Goal status: ONGOING     LONG TERM GOALS: Target: 7th total session   1.  pt will complete HEP with independence over two visits Baseline:  Goal status: ONGOINGL   2.  pt will describe how to modify HEP over time, and the timeline associated with reduction in HEP frequency with modified independence over two sessions Baseline:  Goal status: ONGOING   PLAN:   SLP FREQUENCY:  once per week, Pt will also need MBSS   SLP DURATION:  7 sessions   PLANNED INTERVENTIONS: Aspiration precaution training, Pharyngeal strengthening exercises, Diet toleration management , Trials of upgraded texture/liquids, SLP instruction and feedback, Compensatory strategies, and Patient/family education, MBSS    Thank you,  Havery Moros, CCC-SLP [phone]  Maciah Feeback, CCC-SLP 09/26/2023, 3:11 PM

## 2023-09-27 ENCOUNTER — Other Ambulatory Visit: Payer: Self-pay

## 2023-09-27 DIAGNOSIS — C328 Malignant neoplasm of overlapping sites of larynx: Secondary | ICD-10-CM | POA: Diagnosis not present

## 2023-09-27 DIAGNOSIS — C321 Malignant neoplasm of supraglottis: Secondary | ICD-10-CM | POA: Diagnosis not present

## 2023-09-27 DIAGNOSIS — C051 Malignant neoplasm of soft palate: Secondary | ICD-10-CM | POA: Diagnosis not present

## 2023-09-28 DIAGNOSIS — C328 Malignant neoplasm of overlapping sites of larynx: Secondary | ICD-10-CM | POA: Diagnosis not present

## 2023-09-28 DIAGNOSIS — C321 Malignant neoplasm of supraglottis: Secondary | ICD-10-CM | POA: Diagnosis not present

## 2023-09-28 DIAGNOSIS — C051 Malignant neoplasm of soft palate: Secondary | ICD-10-CM | POA: Diagnosis not present

## 2023-09-29 ENCOUNTER — Other Ambulatory Visit: Payer: Self-pay | Admitting: Oncology

## 2023-09-29 DIAGNOSIS — C321 Malignant neoplasm of supraglottis: Secondary | ICD-10-CM | POA: Diagnosis not present

## 2023-09-29 DIAGNOSIS — C328 Malignant neoplasm of overlapping sites of larynx: Secondary | ICD-10-CM | POA: Diagnosis not present

## 2023-09-29 DIAGNOSIS — G893 Neoplasm related pain (acute) (chronic): Secondary | ICD-10-CM

## 2023-09-29 DIAGNOSIS — C051 Malignant neoplasm of soft palate: Secondary | ICD-10-CM | POA: Diagnosis not present

## 2023-09-30 DIAGNOSIS — C328 Malignant neoplasm of overlapping sites of larynx: Secondary | ICD-10-CM | POA: Diagnosis not present

## 2023-09-30 DIAGNOSIS — C051 Malignant neoplasm of soft palate: Secondary | ICD-10-CM | POA: Diagnosis not present

## 2023-09-30 DIAGNOSIS — C321 Malignant neoplasm of supraglottis: Secondary | ICD-10-CM | POA: Diagnosis not present

## 2023-10-01 DIAGNOSIS — C328 Malignant neoplasm of overlapping sites of larynx: Secondary | ICD-10-CM | POA: Diagnosis not present

## 2023-10-01 DIAGNOSIS — C321 Malignant neoplasm of supraglottis: Secondary | ICD-10-CM | POA: Diagnosis not present

## 2023-10-01 DIAGNOSIS — C051 Malignant neoplasm of soft palate: Secondary | ICD-10-CM | POA: Diagnosis not present

## 2023-10-02 ENCOUNTER — Encounter: Payer: Self-pay | Admitting: Oncology

## 2023-10-02 ENCOUNTER — Inpatient Hospital Stay: Payer: Medicare HMO

## 2023-10-02 DIAGNOSIS — C321 Malignant neoplasm of supraglottis: Secondary | ICD-10-CM | POA: Diagnosis not present

## 2023-10-02 DIAGNOSIS — C328 Malignant neoplasm of overlapping sites of larynx: Secondary | ICD-10-CM | POA: Diagnosis not present

## 2023-10-02 DIAGNOSIS — C051 Malignant neoplasm of soft palate: Secondary | ICD-10-CM | POA: Diagnosis not present

## 2023-10-02 MED ORDER — OXYCODONE HCL 20 MG PO TABS: 20.0000 mg | ORAL_TABLET | Freq: Four times a day (QID) | ORAL | 0 refills | Status: DC | PRN

## 2023-10-02 NOTE — Progress Notes (Addendum)
 Nutrition Follow-up:  Patient with SCC of oropharynx (soft palate, supraglottis with right lymph node involovement).  Patient has completed cisplatin and raidation.  PEG tube in place.  Last chemotherapy on 2/3, last radiation on 2/11  Spoke with patient via phone (called wife and she gave phone to patient).  Reports that he has been giving 3 cartons of osmolite 1.5 via PEG tube.  Also has been drinking 2 cartons of ensure orally.  No tube feeding given so far today.  Said he gave a carton of tube feeding yesterday morning then again later on yesterday and again around 3pm.  Said that he has eaten applesauce only orally.  Reports that his taste has not come back.  Also reports soreness with swallowing.  Noted working with SLP.  Drinks water and sometimes juice orally.  Reports some nausea but controlled with medication.  Having regular bowel movements. Reports thick saliva as well    Medications: reviewed  Labs: reviewed  Anthropometrics:   Weight 131 lb 13.4 oz on 2/13 135 lb on 2/3 139 lb 1.6 oz on 1/24 140 lb 8 oz on 1/17 137 lb 8 oz on 1/13 139 lb 8 oz on 1/6 144 lb on 8 oz on 12/23 141 lb on 12/19   Estimated Energy Needs  Kcals: 2000-2300 Protein: 78-98 g Fluid: > 2 L  NUTRITION DIAGNOSIS: Sub - optimal energy intake continues    INTERVENTION:  Recommend patient increase to 4 cartons of tube feeding daily in addition to drinking 2 cartons of ensure shakes as he is only eating applesauce orally due to taste.  Discussed other soft foods to try but intake effected by no taste.   Continue tube feeding until taste starts to come back Continue working with SLP     MONITORING, EVALUATION, GOAL: weight trends, intake   NEXT VISIT: phone call Monday, March 17.   RD not at [facility] on 3/27 when patient seen by MD.    Drema Halon B. Freida Busman, RD, LDN Registered Dietitian [phone]

## 2023-10-03 ENCOUNTER — Encounter (HOSPITAL_COMMUNITY): Payer: Self-pay | Admitting: Speech Pathology

## 2023-10-03 ENCOUNTER — Ambulatory Visit (HOSPITAL_COMMUNITY): Payer: Medicare HMO | Admitting: Speech Pathology

## 2023-10-03 ENCOUNTER — Other Ambulatory Visit: Payer: Self-pay | Admitting: Medical Oncology

## 2023-10-03 DIAGNOSIS — C328 Malignant neoplasm of overlapping sites of larynx: Secondary | ICD-10-CM | POA: Diagnosis not present

## 2023-10-03 DIAGNOSIS — R1312 Dysphagia, oropharyngeal phase: Secondary | ICD-10-CM | POA: Diagnosis not present

## 2023-10-03 DIAGNOSIS — C051 Malignant neoplasm of soft palate: Secondary | ICD-10-CM | POA: Diagnosis not present

## 2023-10-03 DIAGNOSIS — C321 Malignant neoplasm of supraglottis: Secondary | ICD-10-CM | POA: Diagnosis not present

## 2023-10-03 NOTE — Therapy (Signed)
 OUTPATIENT SPEECH LANGUAGE PATHOLOGY ONCOLOGY EVALUATION   Patient Name: Bryan Fleming MRN: [id number] DOB:[birth year], 63 y.o., male Today's Date: 10/03/2023  PCP: Benetta Spar, MD REFERRING PROVIDER: Lonie Peak, MD  END OF SESSION:  End of Session - 10/03/23 1412     Visit Number 4    Number of Visits 7    Date for SLP Re-Evaluation 11/08/23    Authorization Type [employer]    SLP Start Time 1405    SLP Stop Time  1450    SLP Time Calculation (min) 45 min    Activity Tolerance Patient tolerated treatment well             Past Medical History:  Diagnosis Date   Cancer (HCC)    Lung cancer   COPD (chronic obstructive pulmonary disease) (HCC) 2007   EMPHYSEMA   Depression    Dyspnea    occasional - no oxygen   GERD (gastroesophageal reflux disease) 2010   Hepatitis    Alcoholic    HLD (hyperlipidemia)    no meds   Hypertension    Port-A-Cath in place 01/01/2021   Past Surgical History:  Procedure Laterality Date   BIOPSY  05/15/2023   Procedure: BIOPSY;  Surgeon: Lemar Lofty., MD;  Location: [facility];  Service: Gastroenterology;;   BRONCHIAL BIOPSY  12/23/2020   Procedure: BRONCHIAL BIOPSIES;  Surgeon: Leslye Peer, MD;  Location: [facility] ENDOSCOPY;  Service: Pulmonary;;   BRONCHIAL BRUSHINGS  12/23/2020   Procedure: BRONCHIAL BRUSHINGS;  Surgeon: Leslye Peer, MD;  Location: [facility] ENDOSCOPY;  Service: Pulmonary;;   BRONCHIAL NEEDLE ASPIRATION BIOPSY  12/23/2020   Procedure: BRONCHIAL NEEDLE ASPIRATION BIOPSIES;  Surgeon: Leslye Peer, MD;  Location: [facility] ENDOSCOPY;  Service: Pulmonary;;   BRONCHIAL WASHINGS  12/23/2020   Procedure: BRONCHIAL WASHINGS;  Surgeon: Leslye Peer, MD;  Location: [facility] ENDOSCOPY;  Service: Pulmonary;;   CARDIAC CATHETERIZATION  2010   COLONOSCOPY  03/14/2012   Procedure: COLONOSCOPY;  Surgeon: Malissa Hippo, MD;  Location: [facility];  Service: Endoscopy;  Laterality: N/A;  100    ESOPHAGOGASTRODUODENOSCOPY (EGD) WITH PROPOFOL N/A 05/15/2023   Procedure: ESOPHAGOGASTRODUODENOSCOPY (EGD) WITH PROPOFOL;  Surgeon: Meridee Score Netty Starring., MD;  Location: [facility] ENDOSCOPY;  Service: Gastroenterology;  Laterality: N/A;   EUS N/A 05/15/2023   Procedure: UPPER ENDOSCOPIC ULTRASOUND (EUS) RADIAL;  Surgeon: Lemar Lofty., MD;  Location: [facility] ENDOSCOPY;  Service: Gastroenterology;  Laterality: N/A;   INTERCOSTAL NERVE BLOCK  04/21/2021   Procedure: INTERCOSTAL NERVE BLOCK;  Surgeon: Loreli Slot, MD;  Location: [facility] OR;  Service: Thoracic;;   IR GASTROSTOMY TUBE MOD SED  07/27/2023   IR IMAGING GUIDED PORT INSERTION  01/12/2021   LARYNGOSCOPY N/A 07/04/2023   Procedure: LARYNGOSCOPY WITH BIOPSIES;  Surgeon: Laren Boom, DO;  Location: [facility];  Service: ENT;  Laterality: N/A;   LUNG REMOVAL, PARTIAL  08/2021   PENILE PROSTHESIS IMPLANT N/A 02/17/2022   Procedure: PENILE PROTHESIS INFLATABLE;  Surgeon: [name], MD;  Location: [facility];  Service: Urology;  Laterality: N/A;   PORTACATH PLACEMENT  07/2021   REMOVAL OF PENILE PROSTHESIS N/A 10/13/2022   Procedure: REMOVAL OF PENILE PROSTHESIS;  Surgeon: [name], MD;  Location: [facility];  Service: Urology;  Laterality: N/A;   REMOVAL OF PENILE PROSTHESIS N/A 12/05/2022   Procedure: REMOVAL AND REPLACEMENT OF PENILE PROSTHESIS;  Surgeon: [name], MD;  Location: [facility] ORS;  Service: Urology;  Laterality: N/A;  120  MINUTES NEEDED FOR CASE   VIDEO ASSISTED THORACOSCOPY (VATS)/ LOBECTOMY Left 04/21/2021   Procedure: VIDEO ASSISTED THORACOSCOPY (VATS)/ LOBECTOMY;  Surgeon: Loreli Slot, MD;  Location: 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) 07/19/2023   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   Lesion of larynx 07/04/2023   Dilation of pancreatic duct 05/15/2023   Multiple gastric ulcers 05/15/2023   Gastritis and gastroduodenitis 05/15/2023   Wound dehiscence 12/01/2022   Malfunction of penile prosthesis (HCC) 10/13/2022   Erectile dysfunction 02/17/2022   Lung mass 04/21/2021   S/P Left Video Assisted Thorascopy with Left Upper Lobectomy, Lypmh Node Dissection, Intercostal Nerve block 04/21/2021   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   Joint pain 02/03/2021   Nausea without vomiting 02/03/2021   Primary lung squamous cell carcinoma, left (HCC) 01/01/2021   Port-A-Cath in place 01/01/2021   Cavitating mass in left upper lung lobe 12/17/2020   Acute pancreatitis 06/04/2020   Leukocytosis 06/04/2020   Hypokalemia 06/04/2020   Elevated liver function tests 02/02/2016   Absolute anemia 02/02/2016   Alcohol abuse 08/30/2015   Situational anxiety 08/30/2015   Nicotine dependence, cigarettes, with other nicotine-induced disorders 08/30/2015   Hepatitis 08/31/2011   Hypertension    GERD (gastroesophageal reflux disease)    COPD (chronic obstructive pulmonary disease) (HCC)     ONSET DATE: October 2024   REFERRING DIAG:  C32.8 (ICD-10-CM) - 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 He presented for an EGD/EUS on 05/15/23 to evaluate a possible ampullary process. Procedural findings incidentally revealed a possible mass/lesion on the vallecular fold and corniculate cartilage.06/14/23 He was seen by Dr. Marene Lenz for further evaluation and management. During which time, the patient endorsed mild trouble swallowing and a slight change in his voice. Laryngoscopy performed at that time revealed a exophytic mass lesion involving the right false vocal fold/arytenoid/aryepiglottic fold, partially obscuring the view of the right true cord, but with vocal fold mobility preserved. Physical exam performed was also notable for a small palpable lymph node in the right neck which was non-enlarged. 06/19/23 CT neck completed which revealed the 2.4 cm right supraglottic mass consistent with malignancy; pathologic right level III lymph node consistent with metastatic disease; asymmetric  soft tissue in the left lateral upper oropharynx, indeterminate for a second mucosal malignancy; and a new 11 mm subsolid pulmonary nodule in the right upper lobe with additional smaller peribronchovascular ground-glass densities elsewhere in the right upper lobe, which most likely correlate with an infectious / inflammatory etiology. 07/04/23 He underwent a Laryngoscopy with biopsies of the right tonsil, left soft  palate, right aryepiglottic, right aryepiglottic, right arytenoid, and post cricoid. Pathology showed moderately to poorly differentiated SCC in all the biopsy samples, p16 -. Operative findings per Dr. Marene Lenz: Exophytic mucosal mass involving the right arytenoid, aryepiglottic fold, and post-cricoid area. Extension to vallecula noted, but no obvious mucosal abnormality of the epiglottis, false cords, or true cords. Firm, abnormal mucosal changes of the soft  palate, extending from the right anterior/posterior pillars to the left soft palate. Uvula absent. Firm palpable nodularity of the right tonsil. 07/12/24 PET which showed: hypermetabolic right sided supraglottic mass consistent with the patient's known primary malignancy. Pet also showed: hypermetabolic adjacent right level III lymph node consistent with metastatic disease; and stable surgical changes from the left upper lobectomy. PET otherwise showed no evidence of distant metastatic disease involving the chest, abdomen, pelvis, or bony structures.  To my eye there is low-level hypermetabolic activity in the oropharynx and asymmetry in the oropharynx on his PET/CT. Radiation and Medical oncology consult on 12/11. He will receive chemotherapy and radiation. Treatment plan: He will receive 35 fractions radiation to his Laryngopharynx and bilateral neck with weekly chemotherapy. Treatment started 12/23 and will complete 09/19/23. Pretreatment procedures: 07/27/23 placed PEG/PAC.  PAIN:  Are you having pain? Yes: NPRS scale: 8/10 Pain location: throat Pain description: sore,  Aggravating factors: swallowing Relieving factors: medicine- oxycodone, lidocaine  FALLS: Has patient fallen in last 6 months?  See PT evaluation for details  LIVING ENVIRONMENT: Lives with: lives with their family Lives in: House/apartment  PLOF:  Level of assistance: Independent with ADLs, Independent with IADLs Employment: Full-time employment  PATIENT GOALS: Continue with WNL swallowing following ChRT  OBJECTIVE:  Note: Objective measures were completed at Evaluation unless otherwise noted.  COGNITION: Overall cognitive status: Within functional limits for tasks assessed  LANGUAGE: Receptive and Expressive language appeared WNL.  ORAL MOTOR EXAMINATION: Overall status: WFL  MOTOR SPEECH: Overall motor speech: Appears intact  SUBJECTIVE DYSPHAGIA REPORTS:  Date of onset: first week of December Reported  symptoms:  reduced pharyngeal clearance with foods that aren't mushy/soft ; SLP suspects pt will eventually be PEG dependent over the next 4 weeks  Current diet: Dysphagia 3 (mechanical soft) and thin liquids  Co-morbid voice changes: No  FACTORS WHICH MAY INCREASE RISK OF ADVERSE EVENT IN PRESENCE OF ASPIRATION:  General health: well appearing  Risk factors: none evident  however undergoing ChRT for supraglottic CA  CLINICAL SWALLOW ASSESSMENT:   Dentition: adequate natural dentition Vocal quality at baseline: normal Patient directly observed with POs: Yes: dysphagia 3 (soft) and thin liquids  Feeding: able to feed self Liquids provided by:  bottle Oral phase signs and symptoms:  none noted today Pharyngeal phase signs and symptoms:  none noted today                                                                                                                            TREATMENT DATE:   08/10/23 (eval): Research states the  risk for dysphagia increases due to radiation and/or chemotherapy treatment due to a variety of factors, so SLP educated the pt about the possibility of reduced/limited ability for PO intake during rad tx. SLP also educated pt regarding possible changes to swallowing musculature after rad tx, and why adherence to dysphagia HEP provided today and PO consumption was necessary to inhibit muscle fibrosis following rad tx and to mitigate muscle disuse atrophy. SLP informed pt why this would be detrimental to their swallowing status and to their pulmonary health. Pt demonstrated understanding of these things to SLP. SLP encouraged pt to safely eat and drink as deep into their radiation/chemotherapy as possible to provide the best possible long-term swallowing outcome for pt.  SLP then developed an individualized HEP for pt involving oral and pharyngeal strengthening and ROM and pt was instructed how to perform these exercises, including SLP demonstration. After SLP demonstration,  pt return demonstrated each exercise. SLP ensured pt performance was correct prior to educating pt on next exercise. Pt required usual mod cues faded to modified independent to perform HEP. Pt was instructed to complete this program 6-7 days/week, at least 20 reps a day (I sets of not <5) until 6 months after his last day of rad tx, and then x2 a week after that, indefinitely. Among other modifications for days when pt cannot functionally swallow, SLP also suggested pt to perform only non-swallowing tasks on the handout/HEP, and if necessary to cycle through the swallowing portion so the full program of exercises can be completed instead of fatiguing on one of the swallowing exercises and being unable to perform the other swallowing exercises. SLP instructed that swallowing exercises should then be added back into the regimen as pt is able to do so. Secondly, pt was told that former patients have told SLP that during their course of radiation therapy, taking prescribed pain medication just prior to performing HEP (and eating/drinking) has proven helpful in completing HEP (and eating and drinking) more regularly when going through their course of radiation treatment.   Previous Session: Pt seen for ongoing dysphagia intervention following clinical swallow evaluation on 08/10/2023. Pt has since completed chemoradiation (09/19/2023). He reports odynophagia. He is able to eat pureed foods (mashed potato, yogurt, applesauce) and has 3 cartons of Osmolite a day. He is drinking 2 Boost/Ensure a day as well as a bottle of water a day. Side effects from radiation include burning in the neck and decreased taste. Pt continues with +oral thrush on the tongue, soft palate, and back of the throat. MD aware and treating. RD, Joli Alen recommended "patient increase tube feeding to 4 cartons of osmolite 1.5 daily.  Flush with 60ml of water before and after each feeding. Drink two 350 calorie shakes orally.  If unable to drink shakes  orally will need to give additional 2 cartons of osmolite 1.5 via feeding tube. Pt was given swallowing exercises previously, but he states that he has not been completing. SLP reviewed exercises to complete: effortful swallow, Masaka, Mendelsohn, modified chin tuck against resistance with swallow, and lingual sweep for ROM. Pt able to complete with direct modeling from SLP and will need additional follow up. Pt observed with 2 oz puree and thin water by mouth. Pt with reports of globus and discomfort with swallows and it took him an excessive amount of time to consume limited amount. Pt would benefit from MBSS to visualize swallow to help determine appropriate diet, modifications as needed, strategies, and provided visual feedback for the patient.  Pt agreeable to MBSS and HEP. Pt also given a journal to record PO intake going forward and encouraged to increase Ensure to 3 per day since he appears to tolerate liquids over solids. EAT-10 administered and Pt scored 35. See below. SLP sent request for MBSS.  Previous Session: Pt seen for ongoing dysphagia intervention. He did not bring his folder back with exercises and food log. He stated that he didn't fill it out because he doesn't eat much by mouth. He continues to report dysgeusia and some odynophagia to the lateral aspects of his tongue and cheeks. He is still being treated for oral candidae. He indicated that he is not taking the carafate because he is unable to swallow the pill (it was suggested that he dissolve in a small amount of water to take). He is able to take his pain medication by mouth. He uses the [employer] "a few times a day" and Pt was encouraged to use it before meals to dull the pain with swallow. He also stated that he does not have a regular schedule for taking Ensure or PEG feedings. He is taking 3 cartons of Osmolite a day, but gave two cartons at one time and felt distended and ill. With assist from Pt, SLP developed the following  routine which was provided in written form for Pt: 7am Ensure, 8am PEG, 11am Ensure, 1pm PEG, 6pm Ensure, and 8pm PEG. He acknowledges that the RD wanted him to increase Osmolite to 4 a day or add an Ensure (he has not done either). He was also encouraged to add 2-3 servings of puree (mashed potato, yogurt, applesauce) per day. He consumed a few bites of diced peaches and sips of water in session today. He reports "pain" when swallowing pieces of peaches. Hopefully, the oral pain will diminish over the next week and he can start to consume more by mouth. MBSS arranged for April 14 with possibility of moving sooner if schedule opens up. Pt encouraged to continue with swallowing exercises at home and continue PO.   Current Session: Pt seen for ongoing dysphagia intervention. He did not bring his folder, but reports that he is completing exercises and drinking mostly water by mouth. Pt with limited other PO intake and when asked why, he stated that it does not have any taste. He continues to report pain with swallow (8/10) and use of mouth wash "a couple times a day". Last week, SLP assisted in creating a schedule for him to follow to obtain the calories the RD set forth. He indicated that he had 2 cartons of Osmolite and 1/2 Ensure by 2PM appointment today. SLP reiterated that RD was hoping he would increase to 4 Osmolites a day and 2 Ensure. He stated that too many different people are telling him to do different things and that the "radiation doc has a goal for 5 a day" and "why do other people have the goals for me?". SLP again explained reasons for the team member goals (increase albumin, healing, adequate calories, increase safety and efficiency with swallow...). SLP asked for his goals and he included: improve mouth pain 8/10 and reduce oral secretions. Pt is not eating much at home and SLP asked if he would eat more if he were given shelf stable containers of puddings, oatmeal, banana, Ensures, etc and he  stated that he would. Pt has a microwave, freezer, toaster oven, and oven. He prefers vanilla over other flavors. In session, he consumed 4 oz vanilla pudding and  8 oz water. He was able to demonstrate chin tuck against resistance swallow, Masako, and lingual stretches. He was provided an updated copy of his schedule which includes a visit with Dr. Basilio Cairo tomorrow and PT visit with Carlena Sax next week in [place]. Plan to move his April 14 MBSS sooner. 10/03/23:      EATING ASSESSMENT TOOL (EAT-10) (Given on 09/21/2023)  The patient was asked to rate to what extent the following statements are problematic on a scale of 0-4. 0 = No problem; 4 = Severe problem. A total score of 3 or higher is considered abnormal. My swallowing problem has caused me to lose weight. 3  My swallowing problem interferes with my ability to go out for meals. 4  Swallowing liquids takes extra effort. 3 Swallowing solids takes extra effort. 4  Swallowing pills takes extra effort. 4 Swallowing is painful. 4  The pleasure of eating is affected by my swallowing. 4  When I swallow food sticks in my throat. 2  I cough when I eat. 3 Swallowing is stressful. 4  TOTAL SCORE: 35 [X]  Abnormal (raw score >3) []  Normal    PATIENT EDUCATION: Education details: late effects head/neck radiation on swallow function, HEP procedure, and modification to HEP when difficulty experienced with swallowing during and after radiation course Person educated: Patient Education method: Explanation, Demonstration, Verbal cues, and Handouts Education comprehension: verbalized understanding, returned demonstration, verbal cues required, and needs further education   ASSESSMENT:  CLINICAL IMPRESSION: (from initial evaluation 08/10/23) Patient is a 63 y.o. M who was seen today for assessment of swallowing as they undergo radiation/chemoradiation therapy. Today pt ate [place] sandwich and drank thin liquids without overt s/s oral or pharyngeal  difficulty, however he reports s/sx of pharyngeal dysphagia with foods that are more of a regular diet consistency, c/b reduced pharyngeal clearance and thus need to engage in multiple swallows. At this time pt swallowing is deemed WNL/WFL with Dys III items, and thin liquids. No s/sx oral dysphagia were noted, nor were s/sx of aspiration observed. There are no overt s/s aspiration PNA observed by SLP nor any reported by pt at this time. As swallowing becomes more difficult during his ChRT, he has the option of using his PEG tube for primary nutrition and hydration. Data indicate that pt's swallow ability will likely decrease over the course of radiation/chemoradiation therapy and could very well decline over time following the conclusion of that therapy due to muscle disuse atrophy and/or muscle fibrosis. Pt will cont to need to be seen by SLP in order to assess safety of PO intake, assess the need for recommending any objective swallow assessment, and ensuring pt is correctly completing the individualized HEP.  OBJECTIVE IMPAIRMENTS: include dysphagia. These impairments are limiting patient from safety when swallowing. Factors affecting potential to achieve goals and functional outcome are  none noted today . Patient will benefit from skilled SLP services to address above impairments and improve overall function.  REHAB POTENTIAL: Good  GOALS: Goals reviewed with patient? Yes SHORT TERM GOALS: Target: 3rd total session   1. Pt will compelte HEP with modified independence in 2 sessions Baseline: Goal status: ONGOING   2.  pt will tell SLP why pt is completing HEP with modified independence Baseline:  Goal status: ONGOING   3.  pt will describe 3 overt s/s aspiration PNA with modified independence Baseline:  Goal status: ONGOING   4.  pt will tell SLP how a food journal could hasten return to a more normalized  diet Baseline:  Goal status: ONGOING     LONG TERM GOALS: Target: 7th total  session   1.  pt will complete HEP with independence over two visits Baseline:  Goal status: ONGOINGL   2.  pt will describe how to modify HEP over time, and the timeline associated with reduction in HEP frequency with modified independence over two sessions Baseline:  Goal status: ONGOING   PLAN:   SLP FREQUENCY:  once per week, Pt will also need MBSS   SLP DURATION:  7 sessions   PLANNED INTERVENTIONS: Aspiration precaution training, Pharyngeal strengthening exercises, Diet toleration management , Trials of upgraded texture/liquids, SLP instruction and feedback, Compensatory strategies, and Patient/family education, MBSS    Thank you,  Havery Moros, CCC-SLP [phone]  Kellyjo Edgren, CCC-SLP 10/03/2023, 3:06 PM

## 2023-10-04 ENCOUNTER — Ambulatory Visit
Admission: RE | Admit: 2023-10-04 | Discharge: 2023-10-04 | Disposition: A | Discharge status: Home / Self Care | Payer: Medicare HMO | Source: Ambulatory Visit | Attending: Radiation Oncology | Admitting: Radiation Oncology

## 2023-10-04 VITALS — BP 137/78 | HR 123 | Temp 97.6°F | Resp 20 | Ht 69.0 in | Wt 132.0 lb

## 2023-10-04 DIAGNOSIS — C321 Malignant neoplasm of supraglottis: Secondary | ICD-10-CM | POA: Diagnosis not present

## 2023-10-04 DIAGNOSIS — K8020 Calculus of gallbladder without cholecystitis without obstruction: Secondary | ICD-10-CM | POA: Diagnosis not present

## 2023-10-04 DIAGNOSIS — Z79899 Other long term (current) drug therapy: Secondary | ICD-10-CM | POA: Insufficient documentation

## 2023-10-04 DIAGNOSIS — C051 Malignant neoplasm of soft palate: Secondary | ICD-10-CM | POA: Diagnosis not present

## 2023-10-04 DIAGNOSIS — C328 Malignant neoplasm of overlapping sites of larynx: Secondary | ICD-10-CM | POA: Diagnosis not present

## 2023-10-04 DIAGNOSIS — I700 Atherosclerosis of aorta: Secondary | ICD-10-CM | POA: Insufficient documentation

## 2023-10-04 DIAGNOSIS — R194 Change in bowel habit: Secondary | ICD-10-CM | POA: Diagnosis not present

## 2023-10-04 DIAGNOSIS — I2510 Atherosclerotic heart disease of native coronary artery without angina pectoris: Secondary | ICD-10-CM | POA: Insufficient documentation

## 2023-10-04 DIAGNOSIS — J432 Centrilobular emphysema: Secondary | ICD-10-CM | POA: Diagnosis not present

## 2023-10-04 DIAGNOSIS — B379 Candidiasis, unspecified: Secondary | ICD-10-CM | POA: Insufficient documentation

## 2023-10-04 DIAGNOSIS — Z7952 Long term (current) use of systemic steroids: Secondary | ICD-10-CM | POA: Insufficient documentation

## 2023-10-04 DIAGNOSIS — Z923 Personal history of irradiation: Secondary | ICD-10-CM | POA: Insufficient documentation

## 2023-10-04 DIAGNOSIS — K8689 Other specified diseases of pancreas: Secondary | ICD-10-CM | POA: Insufficient documentation

## 2023-10-04 NOTE — Progress Notes (Signed)
 Radiation Oncology         [phone] ________________________________  Name: Bryan Fleming MRN: [id number]  Date: 10/04/2023  DOB: Sep 16, 1960  Follow-Up Visit Note  CC: Benetta Spar, MD  [name], [name]*  Diagnosis and Prior Radiotherapy:       ICD-10-CM   1. 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 Cancer of soft palate (HCC)  C05.1     3. 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    Bryan Fleming presents to the clinic today for a two week follow up. 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   Pain issues, if any: 8 1/2 throat pain, patient uses oxycodone for pain management. Using a feeding tube?: Patient does have feeding tube, uses it four times a day. Patient is drinking  ensures and pudding. Weight changes, if any: Patient has lost a few pounds since he finished treatment. Swallowing issues, if any: Patient denies but he does experience pain while swallowing. Smoking or chewing tobacco? Patient currently smokes. Using fluoride toothpaste daily? Patient is using fluoride toothpaste Last ENT visit was on: Patient unsure Other notable issues, if any: None   BP 137/78 (BP Location: Left Arm, Patient Position: Sitting, Cuff Size: Normal)   Pulse (!) 123   Temp 97.6 F (36.4 C)   Resp 20   Ht 5\' 9"  (1.753 m)   Wt 132 lb (59.9 kg)   SpO2 100%   BMI 19.49 kg/m   Wt Readings from Last 3 Encounters:  10/04/23 132 lb (59.9 kg)  09/21/23 131 lb 13.4 oz (59.8 kg)  09/16/23 138 lb (62.6 kg)                         ALLERGIES:  has no known allergies.  Meds: Current Outpatient Medications  Medication Sig Dispense Refill   acetaminophen (TYLENOL) 500 MG tablet Take 2 tablets (1,000 mg total) by mouth every 6 (six) hours. 45 tablet 0   albuterol (VENTOLIN HFA) 108 (90 Base) MCG/ACT inhaler Inhale 1-2 puffs into the lungs every 6 (six) hours as needed for wheezing or shortness of breath.     ALPRAZolam (XANAX) 0.5 MG tablet Take 1 tablet (0.5 mg total) by mouth at bedtime as needed for anxiety. 30 tablet 0   amLODipine (NORVASC) 5 MG tablet  Take 5 mg by mouth in the morning.     amoxicillin-clavulanate (AUGMENTIN) 875-125 MG tablet Take 1 tablet by mouth every 12 (twelve) hours. 14 tablet 0   dexamethasone (DECADRON) 4 MG tablet Take 2 tablets (8 mg) by mouth daily x 3 days starting the day after cisplatin chemotherapy. Take with food. 30 tablet 1   fluconazole (DIFLUCAN) 100 MG tablet Take 2 tablets today, then 1 tablet daily x 20 more days. 22 tablet 0   Fluticasone-Umeclidin-Vilant (TRELEGY ELLIPTA) 100-62.5-25 MCG/INH AEPB Inhale 1 puff into the lungs in the morning.     lidocaine (XYLOCAINE) 2 % solution Patient: Mix 1part 2% viscous lidocaine, 1part H20.  Swish & swallow 10mL of diluted mixture, before meals and at bedtime, up to QID 480 mL 3   lidocaine-prilocaine (EMLA) cream Apply to affected area once 30 g 3   loratadine (CLARITIN) 10 MG tablet Take 10 mg by mouth daily.     magnesium oxide (MAG-OX) 400 (240 Mg) MG tablet Take 1 tablet (400 mg total) by mouth daily. 30 tablet 0   metoprolol tartrate (LOPRESSOR) 50 MG tablet Take 50 mg by mouth 2 (two) times daily.     Multiple Vitamin (MULTIVITAMIN WITH MINERALS) TABS tablet Take 1 tablet by mouth in the morning.     Nutritional Supplements (FEEDING SUPPLEMENT, OSMOLITE 1.5 CAL,) LIQD Give 2 cartons at 8am and noon via feeding tube.  Give 1 carton at 4pm and 8pm.  Flush with 60ml of water before and after each feeding.  Give additional 3 cups of water via tube for hydration if unable to drink orally.     ondansetron (ZOFRAN) 8 MG tablet Take 1 tablet (8 mg total) by mouth every 8 (eight) hours as needed for nausea or vomiting. Start on the third day after cisplatin. 30 tablet 1   Oxycodone HCl 20 MG TABS Take 1 tablet (20 mg total) by mouth every 6 (six) hours as needed. 90 tablet 0   pantoprazole (PROTONIX) 20 MG tablet Take 1 tablet (20 mg total) by mouth 2 (two) times daily for 14 days. 28 tablet 0   potassium chloride SA (KLOR-CON M) 20 MEQ tablet Take 1 tablet (20 mEq total) by mouth 2 (two) times daily. 60 tablet 1   prochlorperazine (COMPAZINE) 10 MG tablet Take 1 tablet (10 mg total) by mouth every 6 (six) hours as needed (Nausea or vomiting). 30 tablet 1   sodium fluoride (FLUORISHIELD) 1.1 % GEL dental gel Place 1 Application onto teeth at bedtime.     sucralfate (CARAFATE) 1 g tablet Take 1 tablet (1 g total) by mouth with breakfast, with lunch, and with evening meal for 7 days. 21 tablet 0   No current facility-administered medications for this encounter.    Physical Findings: The patient is in no acute distress. Patient is alert and oriented. Wt Readings from Last 3  Encounters:  10/04/23 132 lb (59.9 kg)  09/21/23 131 lb 13.4 oz (59.8 kg)  09/16/23 138 lb (62.6 kg)    height is 5\' 9"  (1.753 m) and weight is 132 lb (59.9 kg). His temperature is 97.6 F (36.4 C). His blood pressure is 137/78 and his pulse is 123 (abnormal). His respiration is 20 and oxygen saturation is 100%. .  General: Alert and oriented, in no acute distress HEENT: Head is normocephalic. Extraocular movements are intact. Oropharynx is notable for no visible tumor; mild thrush Neck: Neck is notable for no masses Skin: Skin in treatment fields shows  satisfactory healing over neck Heart: slightly tachy, no murmurs, reg rhythm  Chest: + b/l wheezes  Abdomen: + PEG Extremities: No cyanosis or edema. Lymphatics: see Neck Exam Psychiatric: Judgment and insight are intact. Affect is appropriate.   Lab Findings: Lab Results  Component Value Date   WBC 7.0 09/21/2023   HGB 10.6 (L) 09/21/2023   HCT 31.1 (L) 09/21/2023   MCV 98.7 09/21/2023   PLT 198 09/21/2023    Lab Results  Component Value Date   TSH 0.701 07/19/2023    Radiographic Findings: CT CHEST W CONTRAST Result Date: 09/21/2023 CLINICAL DATA:  63 year old male with history of squamous cell carcinoma of the lung. Follow-up study. EXAM: CT CHEST WITH CONTRAST TECHNIQUE: Multidetector CT imaging of the chest was performed during intravenous contrast administration. RADIATION DOSE REDUCTION: This exam was performed according to the departmental dose-optimization program which includes automated exposure control, adjustment of the mA and/or kV according to patient size and/or use of iterative reconstruction technique. CONTRAST:  80mL OMNIPAQUE IOHEXOL 300 MG/ML  SOLN COMPARISON:  PET-CT 07/13/2023. FINDINGS: Cardiovascular: Heart size is normal. There is no significant pericardial fluid, thickening or pericardial calcification. There is aortic atherosclerosis, as well as atherosclerosis of the great vessels of the mediastinum and  the coronary arteries, including calcified atherosclerotic plaque in the left main, left anterior descending, left circumflex and right coronary arteries. Right internal jugular single-lumen Port-A-Cath with tip terminating in the right atrium. Mediastinum/Nodes: No pathologically enlarged mediastinal or hilar lymph nodes. Esophagus is unremarkable in appearance. No axillary lymphadenopathy. Lungs/Pleura: Status post left upper lobectomy. Compensatory hyperexpansion of the left lower lobe. No definite suspicious appearing pulmonary nodules or masses are noted. No acute consolidative airspace disease. No pleural effusions. Diffuse bronchial wall thickening with moderate centrilobular and paraseptal emphysema. Upper Abdomen: Aortic atherosclerosis. Musculoskeletal: There are no aggressive appearing lytic or blastic lesions noted in the visualized portions of the skeleton. IMPRESSION: 1. Status post left upper lobectomy with no findings to suggest metastatic disease in the thorax. 2. Aortic atherosclerosis, in addition to left main and three-vessel coronary artery disease. Please note that although the presence of coronary artery calcium documents the presence of coronary artery disease, the severity of this disease and any potential stenosis cannot be assessed on this non-gated CT examination. Assessment for potential risk factor modification, dietary therapy or pharmacologic therapy may be warranted, if clinically indicated. 3. Diffuse bronchial wall thickening with moderate centrilobular and paraseptal emphysema; imaging findings suggestive of underlying COPD. Aortic Atherosclerosis (ICD10-I70.0) and Emphysema (ICD10-J43.9). Electronically Signed   By: Trudie Reed M.D.   On: 09/21/2023 10:41   CT ABDOMEN PELVIS W CONTRAST Result Date: 09/16/2023 CLINICAL DATA:  Lower abdominal pain, constipation for 2 days, intermittent nausea, history of head and neck and left upper lobe lung cancer EXAM: CT ABDOMEN AND  PELVIS WITH CONTRAST TECHNIQUE: Multidetector CT imaging of the abdomen and pelvis was performed using the standard protocol following bolus administration of intravenous contrast. RADIATION DOSE REDUCTION: This exam was performed according to the departmental dose-optimization program which includes automated exposure control, adjustment of the mA and/or kV according to patient size and/or use of iterative reconstruction technique. CONTRAST:  OMNIPAQUE IOHEXOL 300 MG/ML  SOLN COMPARISON:  11/02/2022, 07/13/2023 FINDINGS: Lower chest: No acute pleural or parenchymal lung disease. Hepatobiliary: Noncalcified gallstones or tumefactive sludge again noted within the gallbladder lumen. No evidence of gallbladder wall thickening. Liver is unremarkable. No biliary duct dilation. Pancreas: Normal enhancement of the pancreatic parenchyma. No inflammatory changes. Chronic  nonspecific dilation of the pancreatic duct measuring up to 7 mm. Spleen: Normal in size without focal abnormality. Adrenals/Urinary Tract: Adrenal glands are unremarkable. Kidneys are normal, without renal calculi, focal lesion, or hydronephrosis. Bladder is unremarkable. Stomach/Bowel: There is wall thickening of the gastric antrum and first portion duodenum, with inflammatory changes and free fluid extending to the porta hepatis. Findings are consistent with peptic ulcer disease or duodenitis. No evidence of perforation. Percutaneous gastrostomy tube is identified within the gastric lumen. No bowel obstruction or ileus. Normal appendix right lower quadrant. There is a large amount of retained stool within the rectal vault, with mild rectal wall thickening and perirectal fat stranding, compatible with fecal impaction and stercoral colitis. Vascular/Lymphatic: Extensive atherosclerosis throughout the aorta and iliac vessels, unchanged since prior exams. No pathologic adenopathy within the abdomen or pelvis. Reproductive: Stable appearance of the  prostate. No acute findings. Penile prosthesis again noted. Reservoir within the right hemipelvis. Other: Small amount of free fluid within the right upper quadrant adjacent to the gastric antral and duodenal wall thickening as above. No free intraperitoneal gas. No abdominal wall hernia. Musculoskeletal: No acute or destructive bony abnormalities. Reconstructed images demonstrate no additional findings. IMPRESSION: 1. Wall thickening of the gastric antrum and first portion duodenum, with inflammatory changes and trace free fluid extending to the porta hepatis. Findings are consistent with gastritis/duodenitis or peptic ulcer disease. 2. Large amount of retained stool within the rectum consistent with fecal impaction. Associated rectal wall thickening and perirectal fat stranding compatible with stercoral colitis. 3. Cholelithiasis without evidence of acute cholecystitis. 4.  Aortic Atherosclerosis (ICD10-I70.0). Electronically Signed   By: Sharlet Salina M.D.   On: 09/16/2023 16:58    Impression/Plan:    1) Head and Neck Cancer Status: healing well from ChRT    2) Nutritional Status:  stabilizing, eating some soft food Wt Readings from Last 3 Encounters:  10/04/23 132 lb (59.9 kg)  09/21/23 131 lb 13.4 oz (59.8 kg)  09/16/23 138 lb (62.6 kg)     PEG tube: using  3) Risk Factors: The patient has been educated about risk factors including alcohol and tobacco abuse; they understand that avoidance of alcohol and tobacco is important to prevent recurrences as well as other cancers. He states he is going to quit this week and plans to use nicotine patches - encouraged to call [id number]  4) Swallowing: good function, continue SLP exercises  5) Dental: Encouraged to continue regular followup with dentistry, and dental hygiene including fluoride rinses. Hedda Slade will give him a list of community dentists. He has fluoride gel from Dr Steva Ready. Discussed dry mouth antidotes  6) Thyroid function:   check annually Lab Results  Component Value Date   TSH 0.701 07/19/2023    7) Other: mild thrush - advised to complete fluconazole Rx  8) Follow-up in 2.5 months w / restaging PET scan. The patient was encouraged to call with any issues or questions before then.  On date of service, in total, I spent 25 minutes on this encounter. Patient was seen in person. _____________________________________   Lonie Peak, MD

## 2023-10-05 ENCOUNTER — Other Ambulatory Visit: Payer: Self-pay

## 2023-10-05 DIAGNOSIS — C051 Malignant neoplasm of soft palate: Secondary | ICD-10-CM | POA: Diagnosis not present

## 2023-10-05 DIAGNOSIS — C328 Malignant neoplasm of overlapping sites of larynx: Secondary | ICD-10-CM

## 2023-10-05 DIAGNOSIS — C321 Malignant neoplasm of supraglottis: Secondary | ICD-10-CM | POA: Diagnosis not present

## 2023-10-05 NOTE — Progress Notes (Signed)
 Oncology Nurse Navigator Documentation   I met with Bryan Fleming during his 2 week post radiation appointment with Dr. Basilio Cairo. He is improving slowly. He continues to see SLP, PT, nutrition, and medical oncology as scheduled. He will return in 2 1/2 months for an appointment with Dr. Basilio Cairo to receive results of his post treatment PET scan. He knows to call me if I can help with any questions or concerns.   Hedda Slade RN, BSN, OCN Head & Neck Oncology Nurse Navigator [facility] at [facility] Phone # [phone]  Fax # [phone]

## 2023-10-06 ENCOUNTER — Encounter: Payer: Self-pay | Admitting: Oncology

## 2023-10-06 ENCOUNTER — Other Ambulatory Visit: Payer: Self-pay

## 2023-10-06 DIAGNOSIS — C321 Malignant neoplasm of supraglottis: Secondary | ICD-10-CM | POA: Diagnosis not present

## 2023-10-06 DIAGNOSIS — C328 Malignant neoplasm of overlapping sites of larynx: Secondary | ICD-10-CM | POA: Diagnosis not present

## 2023-10-06 DIAGNOSIS — C051 Malignant neoplasm of soft palate: Secondary | ICD-10-CM | POA: Diagnosis not present

## 2023-10-07 DIAGNOSIS — C328 Malignant neoplasm of overlapping sites of larynx: Secondary | ICD-10-CM | POA: Diagnosis not present

## 2023-10-07 DIAGNOSIS — C051 Malignant neoplasm of soft palate: Secondary | ICD-10-CM | POA: Diagnosis not present

## 2023-10-07 DIAGNOSIS — C321 Malignant neoplasm of supraglottis: Secondary | ICD-10-CM | POA: Diagnosis not present

## 2023-10-08 DIAGNOSIS — C328 Malignant neoplasm of overlapping sites of larynx: Secondary | ICD-10-CM | POA: Diagnosis not present

## 2023-10-08 DIAGNOSIS — C051 Malignant neoplasm of soft palate: Secondary | ICD-10-CM | POA: Diagnosis not present

## 2023-10-08 DIAGNOSIS — C321 Malignant neoplasm of supraglottis: Secondary | ICD-10-CM | POA: Diagnosis not present

## 2023-10-09 DIAGNOSIS — C328 Malignant neoplasm of overlapping sites of larynx: Secondary | ICD-10-CM | POA: Diagnosis not present

## 2023-10-09 DIAGNOSIS — I10 Essential (primary) hypertension: Secondary | ICD-10-CM | POA: Diagnosis not present

## 2023-10-09 DIAGNOSIS — C051 Malignant neoplasm of soft palate: Secondary | ICD-10-CM | POA: Diagnosis not present

## 2023-10-09 DIAGNOSIS — C321 Malignant neoplasm of supraglottis: Secondary | ICD-10-CM | POA: Diagnosis not present

## 2023-10-09 DIAGNOSIS — K219 Gastro-esophageal reflux disease without esophagitis: Secondary | ICD-10-CM | POA: Diagnosis not present

## 2023-10-10 ENCOUNTER — Telehealth: Payer: Self-pay | Admitting: Physical Therapy

## 2023-10-10 ENCOUNTER — Ambulatory Visit: Payer: Self-pay | Admitting: Physical Therapy

## 2023-10-10 ENCOUNTER — Encounter (HOSPITAL_COMMUNITY): Payer: Medicare HMO | Admitting: Speech Pathology

## 2023-10-10 DIAGNOSIS — C051 Malignant neoplasm of soft palate: Secondary | ICD-10-CM | POA: Diagnosis not present

## 2023-10-10 DIAGNOSIS — C321 Malignant neoplasm of supraglottis: Secondary | ICD-10-CM | POA: Diagnosis not present

## 2023-10-10 DIAGNOSIS — C328 Malignant neoplasm of overlapping sites of larynx: Secondary | ICD-10-CM | POA: Diagnosis not present

## 2023-10-10 NOTE — Telephone Encounter (Signed)
 Called pt due to missed appointment. Pt reports he is not feeling well and has diarrhea. Rescheduled for 10/19/23.

## 2023-10-11 DIAGNOSIS — C321 Malignant neoplasm of supraglottis: Secondary | ICD-10-CM | POA: Diagnosis not present

## 2023-10-11 DIAGNOSIS — C328 Malignant neoplasm of overlapping sites of larynx: Secondary | ICD-10-CM | POA: Diagnosis not present

## 2023-10-11 DIAGNOSIS — C051 Malignant neoplasm of soft palate: Secondary | ICD-10-CM | POA: Diagnosis not present

## 2023-10-12 DIAGNOSIS — C051 Malignant neoplasm of soft palate: Secondary | ICD-10-CM | POA: Diagnosis not present

## 2023-10-12 DIAGNOSIS — C328 Malignant neoplasm of overlapping sites of larynx: Secondary | ICD-10-CM | POA: Diagnosis not present

## 2023-10-12 DIAGNOSIS — C321 Malignant neoplasm of supraglottis: Secondary | ICD-10-CM | POA: Diagnosis not present

## 2023-10-13 DIAGNOSIS — C051 Malignant neoplasm of soft palate: Secondary | ICD-10-CM | POA: Diagnosis not present

## 2023-10-13 DIAGNOSIS — C328 Malignant neoplasm of overlapping sites of larynx: Secondary | ICD-10-CM | POA: Diagnosis not present

## 2023-10-13 DIAGNOSIS — C321 Malignant neoplasm of supraglottis: Secondary | ICD-10-CM | POA: Diagnosis not present

## 2023-10-14 DIAGNOSIS — C321 Malignant neoplasm of supraglottis: Secondary | ICD-10-CM | POA: Diagnosis not present

## 2023-10-14 DIAGNOSIS — C328 Malignant neoplasm of overlapping sites of larynx: Secondary | ICD-10-CM | POA: Diagnosis not present

## 2023-10-14 DIAGNOSIS — C051 Malignant neoplasm of soft palate: Secondary | ICD-10-CM | POA: Diagnosis not present

## 2023-10-15 ENCOUNTER — Encounter (HOSPITAL_COMMUNITY): Payer: Self-pay

## 2023-10-15 ENCOUNTER — Emergency Department (HOSPITAL_COMMUNITY)
Admission: EM | Admit: 2023-10-15 | Discharge: 2023-10-15 | Disposition: A | Discharge status: Home / Self Care | Attending: Emergency Medicine | Admitting: Emergency Medicine

## 2023-10-15 ENCOUNTER — Other Ambulatory Visit: Payer: Self-pay

## 2023-10-15 DIAGNOSIS — J449 Chronic obstructive pulmonary disease, unspecified: Secondary | ICD-10-CM | POA: Diagnosis not present

## 2023-10-15 DIAGNOSIS — C321 Malignant neoplasm of supraglottis: Secondary | ICD-10-CM | POA: Diagnosis not present

## 2023-10-15 DIAGNOSIS — Z85118 Personal history of other malignant neoplasm of bronchus and lung: Secondary | ICD-10-CM | POA: Diagnosis not present

## 2023-10-15 DIAGNOSIS — C328 Malignant neoplasm of overlapping sites of larynx: Secondary | ICD-10-CM | POA: Diagnosis not present

## 2023-10-15 DIAGNOSIS — K9423 Gastrostomy malfunction: Secondary | ICD-10-CM | POA: Diagnosis not present

## 2023-10-15 DIAGNOSIS — K9420 Gastrostomy complication, unspecified: Secondary | ICD-10-CM | POA: Diagnosis not present

## 2023-10-15 DIAGNOSIS — C051 Malignant neoplasm of soft palate: Secondary | ICD-10-CM | POA: Diagnosis not present

## 2023-10-15 NOTE — ED Provider Notes (Signed)
 [place] EMERGENCY DEPARTMENT AT [facility] Provider Note   CSN: [id number] Arrival date & time: 10/15/23  1359     History Chief Complaint  Patient presents with   feeding tube problem    Bryan Fleming is a 63 y.o. male. Patient with past history significant for lung cancer, gastrostomy tube, GERD, COPD presents to the ED with concerns of gastrotomy tube complications. He reports that the tubing has a leak in it at the attachment site but still flows well. No irritation at the abdominal wall. Concerned about having to wait for replacement of tubing here in the ED.  HPI     Home Medications Prior to Admission medications   Medication Sig Start Date End Date Taking? Authorizing Provider  acetaminophen (TYLENOL) 500 MG tablet Take 2 tablets (1,000 mg total) by mouth every 6 (six) hours. 12/06/22   [name], MD  albuterol (VENTOLIN HFA) 108 (90 Base) MCG/ACT inhaler Inhale 1-2 puffs into the lungs every 6 (six) hours as needed for wheezing or shortness of breath.    [provider]  ALPRAZolam Prudy Feeler) 0.5 MG tablet Take 1 tablet (0.5 mg total) by mouth at bedtime as needed for anxiety. 08/17/23   [name], [name], MD  amLODipine (NORVASC) 5 MG tablet Take 5 mg by mouth in the morning. 04/16/20   [provider]  amoxicillin-clavulanate (AUGMENTIN) 875-125 MG tablet Take 1 tablet by mouth every 12 (twelve) hours. 09/16/23   Sloan Leiter, DO  dexamethasone (DECADRON) 4 MG tablet Take 2 tablets (8 mg) by mouth daily x 3 days starting the day after cisplatin chemotherapy. Take with food. 07/21/23   [name], [name], MD  fluconazole (DIFLUCAN) 100 MG tablet Take 2 tablets today, then 1 tablet daily x 20 more days. 09/21/23   Doreatha Massed, MD  Fluticasone-Umeclidin-Vilant (TRELEGY ELLIPTA) 100-62.5-25 MCG/INH AEPB Inhale 1 puff into the lungs in the morning.    [provider]  lidocaine (XYLOCAINE) 2 % solution Patient: Mix 1part 2% viscous  lidocaine, 1part H20. Swish & swallow 10mL of diluted mixture, before meals and at bedtime, up to QID 09/21/23   Doreatha Massed, MD  lidocaine-prilocaine (EMLA) cream Apply to affected area once 07/21/23   [name], [name], MD  loratadine (CLARITIN) 10 MG tablet Take 10 mg by mouth daily. 07/11/23   [provider]  magnesium oxide (MAG-OX) 400 (240 Mg) MG tablet Take 1 tablet (400 mg total) by mouth daily. 09/08/23   [name], [name], MD  metoprolol tartrate (LOPRESSOR) 50 MG tablet Take 50 mg by mouth 2 (two) times daily. 05/12/23   [provider]  Multiple Vitamin (MULTIVITAMIN WITH MINERALS) TABS tablet Take 1 tablet by mouth in the morning.    [provider]  Nutritional Supplements (FEEDING SUPPLEMENT, OSMOLITE 1.5 CAL,) LIQD Give 2 cartons at 8am and noon via feeding tube.  Give 1 carton at 4pm and 8pm.  Flush with 60ml of water before and after each feeding.  Give additional 3 cups of water via tube for hydration if unable to drink orally. 08/21/23   Lonie Peak, MD  ondansetron (ZOFRAN) 8 MG tablet Take 1 tablet (8 mg total) by mouth every 8 (eight) hours as needed for nausea or vomiting. Start on the third day after cisplatin. 07/21/23   [name], [name], MD  Oxycodone HCl 20 MG TABS Take 1 tablet (20 mg total) by mouth every 6 (six) hours as needed. 10/02/23   [name], [name], MD  pantoprazole (PROTONIX) 20 MG  tablet Take 1 tablet (20 mg total) by mouth 2 (two) times daily for 14 days. 09/16/23 09/30/23  Tanda Rockers A, DO  potassium chloride SA (KLOR-CON M) 20 MEQ tablet Take 1 tablet (20 mEq total) by mouth 2 (two) times daily. 08/03/23   [name], [name], MD  prochlorperazine (COMPAZINE) 10 MG tablet Take 1 tablet (10 mg total) by mouth every 6 (six) hours as needed (Nausea or vomiting). 07/21/23   [name], [name], MD  sodium fluoride (FLUORISHIELD) 1.1 % GEL dental gel Place 1 Application onto teeth at bedtime. 07/13/23   [provider]  sucralfate  (CARAFATE) 1 g tablet Take 1 tablet (1 g total) by mouth with breakfast, with lunch, and with evening meal for 7 days. 09/16/23 09/23/23  Sloan Leiter, DO      Allergies    Patient has no known allergies.    Review of Systems   Review of Systems  Skin:  Negative for color change and rash.  All other systems reviewed and are negative.   Physical Exam Updated Vital Signs BP (!) 168/78   Pulse (!) 115   Temp 99 F (37.2 C) (Oral)   Resp 20   Wt 59.9 kg   SpO2 98%   BMI 19.49 kg/m  Physical Exam Vitals and nursing note reviewed.  Constitutional:      General: He is not in acute distress.    Appearance: He is well-developed.  HENT:     Head: Normocephalic and atraumatic.  Eyes:     Conjunctiva/sclera: Conjunctivae normal.  Cardiovascular:     Rate and Rhythm: Normal rate and regular rhythm.     Heart sounds: No murmur heard. Pulmonary:     Effort: Pulmonary effort is normal. No respiratory distress.     Breath sounds: Normal breath sounds.  Abdominal:     General: There is no distension.     Palpations: Abdomen is soft.     Tenderness: There is no abdominal tenderness. There is no guarding.     Comments: Gastrostomy tube inserted in the abdominal wall with no evidence of skin breakdown or infection. Tubing with some leaking present from locking piece but still attaching and flushing.   Musculoskeletal:        General: No swelling.     Cervical back: Neck supple.  Skin:    General: Skin is warm and dry.     Capillary Refill: Capillary refill takes less than 2 seconds.  Neurological:     Mental Status: He is alert.  Psychiatric:        Mood and Affect: Mood normal.     ED Results / Procedures / Treatments   Labs (all labs ordered are listed, but only abnormal results are displayed) Labs Reviewed - No data to display  EKG None  Radiology No results found.  Procedures Procedures    Medications Ordered in ED Medications - No data to display  ED Course/  Medical Decision Making/ A&P                                 Medical Decision Making  This patient presents to the ED for concern of gastrostomy tube concerns. Differential diagnosis includes gastrostomy tube removed, gastrostomy tube complications, abdominal wall infection   Problem List / ED Course:  Patient presents to the ED with concerns with his gastrostomy tube. He is currently being treated for lung cancer. Patient states that the  end piece which syringe attaches to has been leaking. Nurse placed clamp on tubing. No recent fever, chills, abdominal pain, vomiting, or diarrhea. No other acute concerns except regarding tubing. States that he was going to wait to see care team and have this replaced but would come in today to see if he could have it replaced. On exam, tube is firmly in place with no signs of abdominal wall infection or abdominal distension. Abdomen is soft and non-tender. Tubing appears to have some leaking present from the connector site which is patient's concern. Discussed replacing tubing and nurse staff is concerned that we do not have the correct replacement equipment available here. Discussed this with patient and patient would prefer to have clamp placed and be discharged home. I advised that I would recommend waiting for full replacement, but given that tubing is still flushing and functional besides leaking, I think it is reasonable for him to have this replaced outpatient if that is his preference. With no evidence of infection or abdominal tenderness, do not feel that labs or imaging are needed at this time. Patient discharged home in stable condition.   Social Determinants of Health:  Cancer patient  Final Clinical Impression(s) / ED Diagnoses Final diagnoses:  Problem with gastrostomy tube [facility])    Rx / DC Orders ED Discharge Orders     None         Salomon Mast 10/15/23 1527    Terrilee Files, MD 10/15/23 1729

## 2023-10-15 NOTE — Discharge Instructions (Addendum)
 You were seen in the ER today for concerns of your gastrostomy tube. The tubing appears to be leaking. We were able to clamp the tubing which you can do intermittently at home until you can have this replaced by your care team at home. We offered to replace the tubing here but you would prefer to do this at home. If any concerns of complications arise, please return to the ER.

## 2023-10-15 NOTE — ED Triage Notes (Signed)
 Stopper to end of Gtube is leaking.  Pt just wants to clamp it off and follow up with his PCP.

## 2023-10-16 DIAGNOSIS — C321 Malignant neoplasm of supraglottis: Secondary | ICD-10-CM | POA: Diagnosis not present

## 2023-10-16 DIAGNOSIS — C051 Malignant neoplasm of soft palate: Secondary | ICD-10-CM | POA: Diagnosis not present

## 2023-10-16 DIAGNOSIS — C328 Malignant neoplasm of overlapping sites of larynx: Secondary | ICD-10-CM | POA: Diagnosis not present

## 2023-10-17 ENCOUNTER — Telehealth: Payer: Self-pay

## 2023-10-17 ENCOUNTER — Encounter (HOSPITAL_COMMUNITY): Payer: Medicare HMO | Admitting: Speech Pathology

## 2023-10-17 ENCOUNTER — Other Ambulatory Visit: Payer: Self-pay

## 2023-10-17 DIAGNOSIS — C328 Malignant neoplasm of overlapping sites of larynx: Secondary | ICD-10-CM | POA: Diagnosis not present

## 2023-10-17 DIAGNOSIS — C321 Malignant neoplasm of supraglottis: Secondary | ICD-10-CM | POA: Diagnosis not present

## 2023-10-17 DIAGNOSIS — C051 Malignant neoplasm of soft palate: Secondary | ICD-10-CM | POA: Diagnosis not present

## 2023-10-17 NOTE — Telephone Encounter (Signed)
 VM received from patient reporting issues with his feeding tube due to his cat. Patient states he has a temporary cap on it from the ED. Chart reviewed. ED recommended full replacement of tube. Attempted to call back and discuss that with patient but was unable to reach the patient directly and his VM box is full. Order placed for G tube exchange by IR per Dr. Ellin Saba.

## 2023-10-18 ENCOUNTER — Ambulatory Visit (HOSPITAL_COMMUNITY): Payer: Medicare HMO | Attending: Radiation Oncology | Admitting: Speech Pathology

## 2023-10-18 ENCOUNTER — Encounter (HOSPITAL_COMMUNITY): Payer: Self-pay | Admitting: Speech Pathology

## 2023-10-18 ENCOUNTER — Ambulatory Visit (HOSPITAL_COMMUNITY)
Admission: RE | Admit: 2023-10-18 | Discharge: 2023-10-18 | Disposition: A | Discharge status: Home / Self Care | Payer: Medicare HMO | Source: Ambulatory Visit | Attending: Hematology | Admitting: Hematology

## 2023-10-18 ENCOUNTER — Telehealth (HOSPITAL_COMMUNITY): Payer: Self-pay | Admitting: Speech Pathology

## 2023-10-18 DIAGNOSIS — C328 Malignant neoplasm of overlapping sites of larynx: Secondary | ICD-10-CM | POA: Diagnosis not present

## 2023-10-18 DIAGNOSIS — R1310 Dysphagia, unspecified: Secondary | ICD-10-CM | POA: Diagnosis not present

## 2023-10-18 DIAGNOSIS — R1312 Dysphagia, oropharyngeal phase: Secondary | ICD-10-CM | POA: Diagnosis not present

## 2023-10-18 DIAGNOSIS — C051 Malignant neoplasm of soft palate: Secondary | ICD-10-CM | POA: Diagnosis not present

## 2023-10-18 DIAGNOSIS — R638 Other symptoms and signs concerning food and fluid intake: Secondary | ICD-10-CM | POA: Diagnosis not present

## 2023-10-18 DIAGNOSIS — C760 Malignant neoplasm of head, face and neck: Secondary | ICD-10-CM | POA: Insufficient documentation

## 2023-10-18 DIAGNOSIS — C321 Malignant neoplasm of supraglottis: Secondary | ICD-10-CM | POA: Diagnosis not present

## 2023-10-18 NOTE — Telephone Encounter (Signed)
 Telephone Call:  I called the Pt this AM to remind him of his 11:30 AM MBSS today in radiology at [facility] and Pt stated that he would not be able to come today because of problems with his feeding tube. SLP explained the importance of completing this test as it will help determine long term need of feeding tube and appropriate textures and aspiration risks etc. He repeatedly stated, "I'll try", but would not commit/confirm attendance today. I also stated that I would also look at his feeding tube. He stated that he was afraid it would leak. He has not used the feeding tube yet this morning, so SLP encouraged him to not use it, come to his appointment and arrive by 11:15 at [facility] and we will complete the MBSS. Pt ended the call with, "I'll try".   Thank you,  Havery Moros, CCC-SLP [phone]

## 2023-10-19 ENCOUNTER — Telehealth: Payer: Self-pay | Admitting: Physical Therapy

## 2023-10-19 ENCOUNTER — Other Ambulatory Visit: Payer: Self-pay | Admitting: Hematology

## 2023-10-19 ENCOUNTER — Other Ambulatory Visit (HOSPITAL_COMMUNITY): Payer: Self-pay

## 2023-10-19 ENCOUNTER — Ambulatory Visit: Attending: Radiation Oncology | Admitting: Physical Therapy

## 2023-10-19 ENCOUNTER — Ambulatory Visit (HOSPITAL_COMMUNITY)
Admission: RE | Admit: 2023-10-19 | Discharge: 2023-10-19 | Disposition: A | Discharge status: Home / Self Care | Source: Ambulatory Visit | Attending: Hematology | Admitting: Hematology

## 2023-10-19 DIAGNOSIS — C051 Malignant neoplasm of soft palate: Secondary | ICD-10-CM | POA: Diagnosis not present

## 2023-10-19 DIAGNOSIS — K9423 Gastrostomy malfunction: Secondary | ICD-10-CM | POA: Diagnosis present

## 2023-10-19 DIAGNOSIS — C321 Malignant neoplasm of supraglottis: Secondary | ICD-10-CM

## 2023-10-19 DIAGNOSIS — R293 Abnormal posture: Secondary | ICD-10-CM | POA: Insufficient documentation

## 2023-10-19 DIAGNOSIS — C328 Malignant neoplasm of overlapping sites of larynx: Secondary | ICD-10-CM | POA: Insufficient documentation

## 2023-10-19 DIAGNOSIS — K9429 Other complications of gastrostomy: Secondary | ICD-10-CM | POA: Diagnosis not present

## 2023-10-19 MED ORDER — LIDOCAINE VISCOUS HCL 2 % MT SOLN
OROMUCOSAL | Status: AC
Start: 2023-10-19 — End: ?
  Filled 2023-10-19: qty 15

## 2023-10-19 MED ORDER — LIDOCAINE VISCOUS HCL 2 % MT SOLN
15.0000 mL | Freq: Once | OROMUCOSAL | Status: AC
  Administered 2023-10-19: 5 mL via OROMUCOSAL

## 2023-10-19 MED ORDER — TRIPLE PASTE 12.8 % EX OINT
1.0000 | TOPICAL_OINTMENT | CUTANEOUS | 0 refills | Status: AC | PRN
Start: 2023-10-19 — End: ?
  Filled 2023-10-19: qty 56.7, fill #0

## 2023-10-19 MED ORDER — IOHEXOL 300 MG/ML  SOLN
50.0000 mL | Freq: Once | INTRAMUSCULAR | Status: AC | PRN
  Administered 2023-10-19: 10 mL

## 2023-10-19 NOTE — Telephone Encounter (Signed)
 Pt no showed to 9 am eval. Was unable to leave v/m because mailbox was full. He also was a no show for his appointment on 3/4 due to illness.  Milagros Loll [place], [place] 10/19/23 9:22 AM

## 2023-10-20 ENCOUNTER — Other Ambulatory Visit (HOSPITAL_COMMUNITY): Payer: Self-pay

## 2023-10-20 DIAGNOSIS — C051 Malignant neoplasm of soft palate: Secondary | ICD-10-CM | POA: Diagnosis not present

## 2023-10-20 DIAGNOSIS — C321 Malignant neoplasm of supraglottis: Secondary | ICD-10-CM | POA: Diagnosis not present

## 2023-10-20 DIAGNOSIS — C328 Malignant neoplasm of overlapping sites of larynx: Secondary | ICD-10-CM | POA: Diagnosis not present

## 2023-10-21 DIAGNOSIS — C328 Malignant neoplasm of overlapping sites of larynx: Secondary | ICD-10-CM | POA: Diagnosis not present

## 2023-10-21 DIAGNOSIS — C051 Malignant neoplasm of soft palate: Secondary | ICD-10-CM | POA: Diagnosis not present

## 2023-10-21 DIAGNOSIS — C321 Malignant neoplasm of supraglottis: Secondary | ICD-10-CM | POA: Diagnosis not present

## 2023-10-22 DIAGNOSIS — C328 Malignant neoplasm of overlapping sites of larynx: Secondary | ICD-10-CM | POA: Diagnosis not present

## 2023-10-22 DIAGNOSIS — C051 Malignant neoplasm of soft palate: Secondary | ICD-10-CM | POA: Diagnosis not present

## 2023-10-22 DIAGNOSIS — C321 Malignant neoplasm of supraglottis: Secondary | ICD-10-CM | POA: Diagnosis not present

## 2023-10-23 ENCOUNTER — Inpatient Hospital Stay: Payer: Medicare HMO | Attending: Radiation Oncology

## 2023-10-23 ENCOUNTER — Other Ambulatory Visit (HOSPITAL_COMMUNITY): Payer: Self-pay | Admitting: Interventional Radiology

## 2023-10-23 DIAGNOSIS — Z9221 Personal history of antineoplastic chemotherapy: Secondary | ICD-10-CM | POA: Insufficient documentation

## 2023-10-23 DIAGNOSIS — C321 Malignant neoplasm of supraglottis: Secondary | ICD-10-CM | POA: Diagnosis not present

## 2023-10-23 DIAGNOSIS — Z923 Personal history of irradiation: Secondary | ICD-10-CM | POA: Insufficient documentation

## 2023-10-23 DIAGNOSIS — C328 Malignant neoplasm of overlapping sites of larynx: Secondary | ICD-10-CM | POA: Diagnosis not present

## 2023-10-23 DIAGNOSIS — R633 Feeding difficulties, unspecified: Secondary | ICD-10-CM

## 2023-10-23 DIAGNOSIS — C051 Malignant neoplasm of soft palate: Secondary | ICD-10-CM | POA: Diagnosis not present

## 2023-10-23 DIAGNOSIS — Z79899 Other long term (current) drug therapy: Secondary | ICD-10-CM | POA: Insufficient documentation

## 2023-10-23 NOTE — Progress Notes (Signed)
 Nutrition Follow-up:  Patient with SCC of oropharynx (soft palate, supraglottis with right lymph node involvement).  Patient has completed chemotherapy and radiation.  PEG tube in place. Seen in ED for leaking tube.    Spoke with patient via phone. Says that he has not used tube today because he is having leaking and formula is running out.  He is scheduled in IR tomorrow for tube replacement.  He has been eating soft foods pudding, applesauce, mashed potatoes etc today.  Last tube feeding was yesterday.  Prior to today was taking 3-4 tube feedings via tube.  Says that he does not have any taste.  Had MBSS on 3/12 but does not know the results of that yet.      Medications: reviewed  Labs: reviewed  Anthropometrics:   Unsure if weight per patient  131 lb 13.4 oz on 2/13 135 lb on 2/3 139 lb 1.6 oz on 1/24 140 lb 8 oz on 1/17 137 lb 8 oz on 1/13 139 lb 8 oz on 1/6 144 lb on 8 oz on 12/23 141 lb on 12/19   Estimated Energy Needs  Kcals: 2000-2300 Protein: 78-98 g Fluid: > 2 L  NUTRITION DIAGNOSIS: Sub- optimal energy intake continues    INTERVENTION:  G-tube to be replaced tomorrow.  Patient to resume osmolite 1.5 via feeding tube (3-4 cartons daily).   Continue soft foods today. Await MBSS results.  Patient said that he has not heard results of his MBSS.  Follow SLP recommendations     MONITORING, EVALUATION, GOAL: weight trends, tube feeding, intake   NEXT VISIT: Monday, March 31 phone call  Bryan Fleming B. Elease Hashimoto, CSO, LDN Registered Dietitian [phone]

## 2023-10-24 ENCOUNTER — Ambulatory Visit (HOSPITAL_COMMUNITY)
Admission: RE | Admit: 2023-10-24 | Discharge: 2023-10-24 | Disposition: A | Discharge status: Home / Self Care | Source: Ambulatory Visit | Attending: Interventional Radiology | Admitting: Interventional Radiology

## 2023-10-24 DIAGNOSIS — C328 Malignant neoplasm of overlapping sites of larynx: Secondary | ICD-10-CM | POA: Diagnosis not present

## 2023-10-24 DIAGNOSIS — Z431 Encounter for attention to gastrostomy: Secondary | ICD-10-CM | POA: Diagnosis not present

## 2023-10-24 DIAGNOSIS — C051 Malignant neoplasm of soft palate: Secondary | ICD-10-CM | POA: Diagnosis not present

## 2023-10-24 DIAGNOSIS — K9429 Other complications of gastrostomy: Secondary | ICD-10-CM | POA: Diagnosis not present

## 2023-10-24 DIAGNOSIS — C321 Malignant neoplasm of supraglottis: Secondary | ICD-10-CM | POA: Diagnosis not present

## 2023-10-24 DIAGNOSIS — R633 Feeding difficulties, unspecified: Secondary | ICD-10-CM | POA: Diagnosis not present

## 2023-10-25 ENCOUNTER — Ambulatory Visit (HOSPITAL_COMMUNITY): Payer: Medicare HMO | Attending: Hematology

## 2023-10-25 ENCOUNTER — Inpatient Hospital Stay: Payer: Medicare HMO

## 2023-10-25 VITALS — BP 117/83 | HR 118 | Resp 20

## 2023-10-25 DIAGNOSIS — C051 Malignant neoplasm of soft palate: Secondary | ICD-10-CM | POA: Diagnosis not present

## 2023-10-25 DIAGNOSIS — Z9221 Personal history of antineoplastic chemotherapy: Secondary | ICD-10-CM | POA: Diagnosis not present

## 2023-10-25 DIAGNOSIS — Z7189 Other specified counseling: Secondary | ICD-10-CM

## 2023-10-25 DIAGNOSIS — Z79899 Other long term (current) drug therapy: Secondary | ICD-10-CM | POA: Diagnosis not present

## 2023-10-25 DIAGNOSIS — C328 Malignant neoplasm of overlapping sites of larynx: Secondary | ICD-10-CM | POA: Diagnosis not present

## 2023-10-25 DIAGNOSIS — C321 Malignant neoplasm of supraglottis: Secondary | ICD-10-CM | POA: Diagnosis not present

## 2023-10-25 DIAGNOSIS — Z923 Personal history of irradiation: Secondary | ICD-10-CM | POA: Diagnosis not present

## 2023-10-25 LAB — CBC WITH DIFFERENTIAL/PLATELET
Abs Immature Granulocytes: 0.06 10*3/uL (ref 0.00–0.07)
Basophils Absolute: 0 10*3/uL (ref 0.0–0.1)
Basophils Relative: 0 %
Eosinophils Absolute: 0 10*3/uL (ref 0.0–0.5)
Eosinophils Relative: 0 %
HCT: 31.5 % — ABNORMAL LOW (ref 39.0–52.0)
Hemoglobin: 10.3 g/dL — ABNORMAL LOW (ref 13.0–17.0)
Immature Granulocytes: 1 %
Lymphocytes Relative: 15 %
Lymphs Abs: 1.4 10*3/uL (ref 0.7–4.0)
MCH: 33.9 pg (ref 26.0–34.0)
MCHC: 32.7 g/dL (ref 30.0–36.0)
MCV: 103.6 fL — ABNORMAL HIGH (ref 80.0–100.0)
Monocytes Absolute: 1.3 10*3/uL — ABNORMAL HIGH (ref 0.1–1.0)
Monocytes Relative: 14 %
Neutro Abs: 6.5 10*3/uL (ref 1.7–7.7)
Neutrophils Relative %: 70 %
Platelets: 250 10*3/uL (ref 150–400)
RBC: 3.04 MIL/uL — ABNORMAL LOW (ref 4.22–5.81)
RDW: 15.5 % (ref 11.5–15.5)
WBC: 9.3 10*3/uL (ref 4.0–10.5)
nRBC: 0 % (ref 0.0–0.2)

## 2023-10-25 LAB — COMPREHENSIVE METABOLIC PANEL
ALT: 11 U/L (ref 0–44)
AST: 20 U/L (ref 15–41)
Albumin: 3.4 g/dL — ABNORMAL LOW (ref 3.5–5.0)
Alkaline Phosphatase: 73 U/L (ref 38–126)
Anion gap: 17 — ABNORMAL HIGH (ref 5–15)
BUN: 20 mg/dL (ref 8–23)
CO2: 25 mmol/L (ref 22–32)
Calcium: 9.6 mg/dL (ref 8.9–10.3)
Chloride: 93 mmol/L — ABNORMAL LOW (ref 98–111)
Creatinine, Ser: 0.73 mg/dL (ref 0.61–1.24)
GFR, Estimated: >60 mL/min (ref >60)
Glucose, Bld: 115 mg/dL — ABNORMAL HIGH (ref 70–99)
Potassium: 3.4 mmol/L — ABNORMAL LOW (ref 3.5–5.1)
Sodium: 135 mmol/L (ref 135–145)
Total Bilirubin: 0.2 mg/dL (ref 0.0–1.2)
Total Protein: 7.5 g/dL (ref 6.5–8.1)

## 2023-10-25 LAB — TSH: TSH: 1.039 u[IU]/mL (ref 0.350–4.500)

## 2023-10-25 MED ORDER — HEPARIN SOD (PORK) LOCK FLUSH 100 UNIT/ML IV SOLN
500.0000 [IU] | Freq: Once | INTRAVENOUS | Status: AC
  Administered 2023-10-25: 500 [IU] via INTRAVENOUS

## 2023-10-25 MED ORDER — SODIUM CHLORIDE 0.9% FLUSH
10.0000 mL | Freq: Once | INTRAVENOUS | Status: AC
  Administered 2023-10-25: 10 mL via INTRAVENOUS

## 2023-10-25 NOTE — Progress Notes (Signed)
 Patients port flushed without difficulty.  Good blood return noted with no bruising or swelling noted at site.  Band aid applied.  Labs drawn per orders. VSS with discharge and left in satisfactory condition with no s/s of distress noted. All follow ups as scheduled.       Aimy Sweeting [employer]

## 2023-10-26 NOTE — Therapy (Signed)
 Modified Barium Swallow Study  Patient Details  Name: Bryan Fleming MRN: [id number] Date of Birth: 08/23/1960  Today's Date: 10/18/2023  HPI/PMH: Past Medical History:  Past Medical History:  Diagnosis Date   Cancer [facility])    Lung cancer   COPD (chronic obstructive pulmonary disease) (HCC) 2007   EMPHYSEMA   Depression    Dyspnea    occasional - no oxygen   GERD (gastroesophageal reflux disease) 2010   Hepatitis    Alcoholic    HLD (hyperlipidemia)    no meds   Hypertension    Port-A-Cath in place 01/01/2021   Past Surgical History:  Past Surgical History:  Procedure Laterality Date   BIOPSY  05/15/2023   Procedure: BIOPSY;  Surgeon: Lemar Lofty., MD;  Location: [facility] ENDOSCOPY;  Service: Gastroenterology;;   BRONCHIAL BIOPSY  12/23/2020   Procedure: BRONCHIAL BIOPSIES;  Surgeon: Leslye Peer, MD;  Location: [facility] ENDOSCOPY;  Service: Pulmonary;;   BRONCHIAL BRUSHINGS  12/23/2020   Procedure: BRONCHIAL BRUSHINGS;  Surgeon: Leslye Peer, MD;  Location: [facility] ENDOSCOPY;  Service: Pulmonary;;   BRONCHIAL NEEDLE ASPIRATION BIOPSY  12/23/2020   Procedure: BRONCHIAL NEEDLE ASPIRATION BIOPSIES;  Surgeon: Leslye Peer, MD;  Location: [facility] ENDOSCOPY;  Service: Pulmonary;;   BRONCHIAL WASHINGS  12/23/2020   Procedure: BRONCHIAL WASHINGS;  Surgeon: Leslye Peer, MD;  Location: [facility] ENDOSCOPY;  Service: Pulmonary;;   CARDIAC CATHETERIZATION  2010   COLONOSCOPY  03/14/2012   Procedure: COLONOSCOPY;  Surgeon: Malissa Hippo, MD;  Location: [facility] ENDO SUITE;  Service: Endoscopy;  Laterality: N/A;  100   ESOPHAGOGASTRODUODENOSCOPY (EGD) WITH PROPOFOL N/A 05/15/2023   Procedure: ESOPHAGOGASTRODUODENOSCOPY (EGD) WITH PROPOFOL;  Surgeon: Meridee Score Netty Starring., MD;  Location: [facility] ENDOSCOPY;  Service: Gastroenterology;  Laterality: N/A;   EUS N/A 05/15/2023   Procedure: UPPER ENDOSCOPIC ULTRASOUND (EUS) RADIAL;  Surgeon: Lemar Lofty., MD;  Location: [facility] ENDOSCOPY;   Service: Gastroenterology;  Laterality: N/A;   INTERCOSTAL NERVE BLOCK  04/21/2021   Procedure: INTERCOSTAL NERVE BLOCK;  Surgeon: Loreli Slot, MD;  Location: [facility] OR;  Service: Thoracic;;   IR GASTROSTOMY TUBE MOD SED  07/27/2023   IR IMAGING GUIDED PORT INSERTION  01/12/2021   IR REPLACE G-TUBE SIMPLE WO FLUORO  10/24/2023   IR REPLC GASTRO/COLONIC TUBE PERCUT W/FLUORO  10/19/2023   LARYNGOSCOPY N/A 07/04/2023   Procedure: LARYNGOSCOPY WITH BIOPSIES;  Surgeon: Laren Boom, DO;  Location: [facility];  Service: ENT;  Laterality: N/A;   LUNG REMOVAL, PARTIAL  08/2021   PENILE PROSTHESIS IMPLANT N/A 02/17/2022   Procedure: PENILE PROTHESIS INFLATABLE;  Surgeon: [name], MD;  Location: [facility] ORS;  Service: Urology;  Laterality: N/A;   PORTACATH PLACEMENT  07/2021   REMOVAL OF PENILE PROSTHESIS N/A 10/13/2022   Procedure: REMOVAL OF PENILE PROSTHESIS;  Surgeon: [name], MD;  Location: [facility] ORS;  Service: Urology;  Laterality: N/A;   REMOVAL OF PENILE PROSTHESIS N/A 12/05/2022   Procedure: REMOVAL AND REPLACEMENT OF PENILE PROSTHESIS;  Surgeon: [name], MD;  Location: [facility] ORS;  Service: Urology;  Laterality: N/A;  120 MINUTES NEEDED FOR CASE   VIDEO ASSISTED THORACOSCOPY (VATS)/ LOBECTOMY Left 04/21/2021   Procedure: VIDEO ASSISTED THORACOSCOPY (VATS)/ LOBECTOMY;  Surgeon: Loreli Slot, MD;  Location: [facility] OR;  Service: Thoracic;  Laterality: Left;   VIDEO BRONCHOSCOPY WITH ENDOBRONCHIAL NAVIGATION N/A 12/23/2020   Procedure: VIDEO BRONCHOSCOPY WITH ENDOBRONCHIAL NAVIGATION;  Surgeon: Leslye Peer, MD;  Location: [facility] ENDOSCOPY;  Service:  Pulmonary;  Laterality: N/A;   WRIST GANGLION EXCISION  1985   ONSET DATE: October 2024    REFERRING DIAG:  C32.8 (ICD-10-CM) - 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 He presented for an EGD/EUS on 05/15/23 to evaluate a possible ampullary process. Procedural findings incidentally revealed a possible mass/lesion on the vallecular fold and corniculate cartilage.06/14/23 He was seen by Dr. Marene Lenz for further evaluation and management. During which time, the patient endorsed mild trouble swallowing and a slight change in his voice. Laryngoscopy performed at that time revealed a exophytic mass lesion involving the right false vocal fold/arytenoid/aryepiglottic fold, partially obscuring the view of the right true cord, but with vocal fold mobility preserved. Physical exam performed was also notable for a small palpable lymph node in the right neck which was non-enlarged. 06/19/23 CT neck completed which revealed the 2.4 cm right supraglottic mass consistent with malignancy; pathologic right level III lymph node consistent with metastatic disease; asymmetric  soft tissue in the left lateral upper oropharynx, indeterminate for a second mucosal malignancy; and a new 11 mm subsolid pulmonary nodule in the right upper lobe with additional smaller peribronchovascular ground-glass densities elsewhere in the right upper lobe, which most likely correlate with an infectious / inflammatory etiology. 07/04/23 He underwent a Laryngoscopy with biopsies of the right tonsil, left soft palate, right aryepiglottic, right aryepiglottic, right arytenoid, and post cricoid. Pathology showed moderately to poorly differentiated SCC in all the biopsy samples, p16 -. Operative findings per Dr. Marene Lenz: Exophytic mucosal mass involving the right arytenoid, aryepiglottic fold, and post-cricoid area. Extension to vallecula noted, but no obvious mucosal  abnormality of the epiglottis, false cords, or true cords. Firm, abnormal mucosal changes of the soft palate, extending from the right anterior/posterior pillars to the left soft palate. Uvula absent. Firm palpable nodularity of the right tonsil. 07/12/24 PET which showed: hypermetabolic right sided supraglottic mass consistent with the patient's known primary malignancy. Pet also showed: hypermetabolic adjacent right level III lymph node consistent with metastatic disease; and stable surgical changes from the left upper lobectomy. PET otherwise showed no evidence of distant metastatic disease involving the chest, abdomen, pelvis, or bony structures.  To my eye there is low-level hypermetabolic activity in the oropharynx and asymmetry in the oropharynx on his PET/CT. Radiation and Medical oncology consult on 12/11. He will receive chemotherapy and radiation. Treatment plan: He will receive 35 fractions radiation to his Laryngopharynx and bilateral neck with weekly chemotherapy. Treatment started 12/23 and will complete 09/19/23. Pretreatment procedures: 07/27/23 placed PEG/PAC.  Caregiver present No  Diet Prior to this Study Dysphagia 2 (finely chopped);G-tube;Thin liquids (Level 0)  Temperature  Normal  Respiratory Status WFL  Supplemental O2 None (Room air)  History of Recent Intubation No  Behavior/Cognition Alert;Cooperative;Pleasant mood  Self-Feeding Abilities Able to self-feed  Baseline vocal quality/speech Normal  Volitional Cough Able to elicit  Volitional Cough Assessment Appears WFL  Volitional Swallow Able to elicit  Anatomy [facility]  Orofacial Exam  Oral Cavity: Oral Hygiene WFL  Oral Cavity - Dentition Missing dentition  Orofacial Anatomy WFL  Oral Motor/Sensory Function WFL  Boluses Administered  Boluses Administered Thin liquids (Level  0);Mildly thick liquids (Level 2, nectar thick);Moderately thick liquids (Level 3, honey thick);Puree;Solid  Oral Impairment Domain  Lip Closure  Interlabial escape, no progression to anterior lip;No labial escape  Tongue control during bolus hold Escape to lateral buccal cavity/floor of mouth  Bolus preparation/mastication Disorganized chewing/mashing with solid pieces of bolus unchewed  Bolus transport/lingual motion Slow tongue motion  Oral residue Residue collection on oral structures  Location of oral residue  Tongue;Palate  Initiation of pharyngeal swallow  Valleculae  Pharyngeal Impairment Domain  Soft palate elevation Trace column of contrast or air between SP and PW  Laryngeal elevation Partial superior movement of thyroid cartilage/partial approximation of arytenoids to epiglottic petiole  Anterior hyoid excursion Partial anterior movement  Epiglottic movement Partial inversion  Laryngeal vestibule closure Incomplete, narrow column air/contrast in laryngeal vestibule  Pharyngeal stripping wave  Present - complete  Pharyngeal contraction (A/P view only) N/A  Pharyngoesophageal segment opening Complete distension and complete duration, no obstruction of flow  Tongue base retraction Narrow column of contrast or air between tongue base and PPW  Pharyngeal residue Collection of residue within or on pharyngeal structures  Location of pharyngeal residue Valleculae;Tongue base  Esophageal Impairment Domain  Esophageal clearance upright position Esophageal retention with retrograde flow below pharyngoesophageal segment (PES)  Pill  Consistency administered Thin liquids (Level 0)  Thin liquids (Level 0) WFL  Penetration/Aspiration Scale Score  1.  Material does not enter airway Puree;Solid;Pill  2.  Material enters airway, remains ABOVE vocal cords then ejected out Thin liquids (Level 0);Mildly thick liquids (Level 2, nectar thick)  7.  Material enters airway, passes BELOW cords and not ejected out despite cough attempt by patient Moderately thick liquids (Level 3, honey thick)  Compensatory Strategies  Compensatory strategies Yes   Multiple swallows Effective  Effective Multiple Swallows  (all)  Liquid wash Effective  Effective Liquid Wash Puree;Solid  Left head turn Ineffective  Right head turn Ineffective  Clinical Impression  Clinical Impression Pt presents with mild/mod oropharyngeal dysphagia characterized by decreased oral bolus cohesiveness with piecemeal deglutition, swallow trigger at the level of the valleculae, min reduced velopharyngeal retraction with stasis/residue of bolus near soft palate, decreased tongue base retraction, min reduced epiglottic deflection, and min reduced laryngeal vestibule closure resulting in trace penetration of tsp thin x1, tsp NTL x1, and aspiration of a portion of HTL on the third swallow on a single cup sip presentation, which dropped from the soft palate straight down into the trachea. This aspiration was sensed as Pt coughed, but was unable to remove. Pt tolerated cup sips thin without incident except for trace vallecular residue after the swallow and repeat/dry swallow was effective in clearing. Pt consumed puree and regular textures and needed to swallow 2x to clear min vallecular residue after the initial swallow (however Pt with oral residuals at times which required additional swallows). Question impact of radiation near soft palate, which may be the source of Pt reports of stasis/discomfort. Recommend D3/mech soft and thin liquids via cup/straw and ok for pills whole with water or in puree. Pt will need to work hard to continue to eat by mouth, as he reports diminished appetite and desire to eat. SLP encouraged Pt to discuss eliminating/reducing PEG feedings as HE CAN eat and drink all by mouth, but will require dedication. SLP will continue to follow as an outpatient, however follow through of completing exercises and increasing PO intake has been limited.   SLP Visit Diagnosis Dysphagia, oropharyngeal phase (R13.12)  Exam Limitations No  limitations  Swallowing Evaluation  Recommendations  Recommendations PO diet  PO Diet Recommendation Dysphagia 3 (Mechanical soft);Thin liquids (Level 0)  Liquid Administration via Cup;Straw  Medication Administration Whole meds with liquid  Supervision Patient able to self-feed  Swallowing strategies   Multiple dry swallows after each bite/sip  Postural changes Position pt fully upright for meals;Stay upright 30-60 min after meals  Oral care recommendations Oral care BID (2x/day)  Treatment Plan  Treatment recommendations Therapy as outlined in treatment plan below  Follow-up recommendations Outpatient SLP   Thank you,  Havery Moros, CCC-SLP [phone]  Myndi Wamble 10/18/2023, 5:04 PM

## 2023-10-30 ENCOUNTER — Telehealth: Payer: Self-pay | Admitting: *Deleted

## 2023-10-30 NOTE — Telephone Encounter (Signed)
 Patient called to state that Ct was closed when he went for his scheduled appointment on 3/19.  States that he was not aware that he needed to go through the ER entrance to register since it was after hours.  Provided Central scheduling number and made him aware he would need to call to reschedule.  Verbalized understanding.  Will reschedule follow up with Dr. Ellin Saba after scan.

## 2023-11-02 ENCOUNTER — Encounter: Payer: Medicare HMO | Admitting: Dietician

## 2023-11-02 ENCOUNTER — Inpatient Hospital Stay: Payer: Medicare HMO | Admitting: Hematology

## 2023-11-05 ENCOUNTER — Other Ambulatory Visit: Payer: Self-pay | Admitting: Hematology

## 2023-11-05 DIAGNOSIS — C321 Malignant neoplasm of supraglottis: Secondary | ICD-10-CM

## 2023-11-06 ENCOUNTER — Inpatient Hospital Stay

## 2023-11-06 NOTE — Telephone Encounter (Signed)
 Are we filling for him?

## 2023-11-06 NOTE — Progress Notes (Signed)
 Nutrition Follow-up:  Patient with SCC of oropharynx (soft palate, supraglottis with right lymph node involvement).  Patient has completed chemotherapy and radiation.  PEG in place.    Spoke with patient via phone.  Says that he is using osmolite 1.5 3-4 times a day via tube.  Has given 2 cartons of osmolite 1.5 so far today.  Noted SLP recommends dysphagia 3 mechanical soft with thin liquids.  Says that he is eating boiled chicken, tuna sandwiches, chicken salad, mashed potatoes, hamburger, soft cooked bacon, banana.      Medications: reviewed  Labs: reviewed  Anthropometrics:   Weight 132 lb on 3/9  131 lb 13.4 oz on 2/13 135 lb on 2/3 139 lb 1.6 oz on 1/24 140 lb 8 oz on 1/17 137 lb 8 oz on 1/13 139 lb 8 oz on 1/6 144 lb on 8 oz on 12/23 141 lb on 12/19   Estimated Energy Needs  Kcals: 2000-2300 Protein: 78-98 g Fluid: > 2 L  NUTRITION DIAGNOSIS: Sub optimal energy intake continues   INTERVENTION:  Recommend that patient stop using osmolite 1.5 and drink orally ensure plus or boost plus 2-3 times a day in between meals.  Educated patient to have 2-3 food items at breakfast, lunch and dinner plus shakes in between meals.   Discussed options for breakfast meal (eggs, grits/oatmeal, canned fruit/banana, bacon at breakfast.  Lunch sandwich plus another food item (soft fruit or pudding cup).  Dinner could be boiled chicken chopped with gravy, soft cooked vegetable, canned/soft fruit.   Encouraged patient to get a home scale and weigh weekly.  Goal would be to not use feeding tube for at least 4 weeks and keep weight stable.  Continue to flush tube with 60ml of water daily.  Patient verbalized understanding.     MONITORING, EVALUATION, GOAL: weight trends, intake   NEXT VISIT: phone call Monday, April 28 during infusion.    Zamier Eggebrecht B. Elease Hashimoto, CSO, LDN Registered Dietitian [phone]

## 2023-11-07 ENCOUNTER — Encounter: Payer: Self-pay | Admitting: Oncology

## 2023-11-09 DIAGNOSIS — K219 Gastro-esophageal reflux disease without esophagitis: Secondary | ICD-10-CM | POA: Diagnosis not present

## 2023-11-09 DIAGNOSIS — I10 Essential (primary) hypertension: Secondary | ICD-10-CM | POA: Diagnosis not present

## 2023-11-13 ENCOUNTER — Ambulatory Visit (HOSPITAL_COMMUNITY)
Admission: RE | Admit: 2023-11-13 | Discharge: 2023-11-13 | Disposition: A | Discharge status: Home / Self Care | Source: Ambulatory Visit | Attending: Hematology | Admitting: Hematology

## 2023-11-13 DIAGNOSIS — C321 Malignant neoplasm of supraglottis: Secondary | ICD-10-CM | POA: Insufficient documentation

## 2023-11-13 DIAGNOSIS — C760 Malignant neoplasm of head, face and neck: Secondary | ICD-10-CM | POA: Diagnosis not present

## 2023-11-13 DIAGNOSIS — D508 Other iron deficiency anemias: Secondary | ICD-10-CM | POA: Diagnosis not present

## 2023-11-13 DIAGNOSIS — I6523 Occlusion and stenosis of bilateral carotid arteries: Secondary | ICD-10-CM | POA: Diagnosis not present

## 2023-11-13 MED ORDER — IOHEXOL 300 MG/ML  SOLN
75.0000 mL | Freq: Once | INTRAMUSCULAR | Status: AC | PRN
  Administered 2023-11-13: 75 mL via INTRAVENOUS

## 2023-11-20 ENCOUNTER — Other Ambulatory Visit (HOSPITAL_COMMUNITY): Payer: Medicare HMO

## 2023-11-20 ENCOUNTER — Encounter: Payer: Self-pay | Admitting: Oncology

## 2023-11-20 ENCOUNTER — Ambulatory Visit (HOSPITAL_COMMUNITY): Payer: Medicare HMO | Admitting: Speech Pathology

## 2023-11-20 ENCOUNTER — Inpatient Hospital Stay: Attending: Radiation Oncology | Admitting: Hematology

## 2023-11-20 ENCOUNTER — Other Ambulatory Visit: Payer: Self-pay | Admitting: *Deleted

## 2023-11-20 VITALS — BP 117/74 | HR 125 | Temp 97.2°F | Resp 20 | Ht 69.0 in | Wt 129.2 lb

## 2023-11-20 DIAGNOSIS — C3412 Malignant neoplasm of upper lobe, left bronchus or lung: Secondary | ICD-10-CM | POA: Insufficient documentation

## 2023-11-20 DIAGNOSIS — G629 Polyneuropathy, unspecified: Secondary | ICD-10-CM | POA: Insufficient documentation

## 2023-11-20 DIAGNOSIS — Z923 Personal history of irradiation: Secondary | ICD-10-CM | POA: Diagnosis not present

## 2023-11-20 DIAGNOSIS — G893 Neoplasm related pain (acute) (chronic): Secondary | ICD-10-CM | POA: Diagnosis not present

## 2023-11-20 DIAGNOSIS — Z800 Family history of malignant neoplasm of digestive organs: Secondary | ICD-10-CM | POA: Diagnosis not present

## 2023-11-20 DIAGNOSIS — C321 Malignant neoplasm of supraglottis: Secondary | ICD-10-CM | POA: Diagnosis not present

## 2023-11-20 DIAGNOSIS — Z9221 Personal history of antineoplastic chemotherapy: Secondary | ICD-10-CM | POA: Insufficient documentation

## 2023-11-20 DIAGNOSIS — F17210 Nicotine dependence, cigarettes, uncomplicated: Secondary | ICD-10-CM | POA: Diagnosis not present

## 2023-11-20 MED ORDER — FLUCONAZOLE 100 MG PO TABS: ORAL_TABLET | ORAL | 0 refills | Status: DC

## 2023-11-20 MED ORDER — OXYCODONE HCL 20 MG PO TABS: 20.0000 mg | ORAL_TABLET | Freq: Three times a day (TID) | ORAL | 0 refills | Status: DC | PRN

## 2023-11-20 NOTE — Progress Notes (Signed)
 [facility] [address], [place] [address]    Clinic Day:  03/16/2023  Referring physician: Benetta Spar*  Patient Care Team: Benetta Spar, MD as PCP - General (Internal Medicine) Wendall Stade, MD as PCP - Cardiology (Cardiology) Therese Sarah, RN as Oncology Nurse Navigator (Oncology) Doreatha Massed, MD as Medical Oncologist (Medical Oncology) Malmfelt, Lise Auer, RN as Oncology Nurse Navigator Lonie Peak, MD as Consulting Physician (Radiation Oncology) [name], MD as Consulting Physician (Oncology) Laren Boom, DO as Consulting Physician (Otolaryngology) Lonie Peak, MD as Attending Physician (Radiation Oncology)   ASSESSMENT & PLAN:   Assessment:  1.  Supraglottic squamous cell carcinoma: - Incidental supraglottic mass found during endoscopy by Dr. Meridee Score - ENT Dr. Marene Lenz examination findings: Exophytic mass lesion involving right false vocal cord/arytenoids/aryepiglottic fold, partially obscures view of the right true vocal cord with preservation of vocal cord mobility.  Hypopharynx normal without mass. - PET scan (07/13/2023): Hypermetabolic right-sided supraglottic mass, hypermetabolic right level 3 lymph node.  No other metastatic disease. - Chemoradiation therapy with weekly cisplatin from 07/31/2023 through 09/19/2023 under the direction of Dr. Katheran Awe.  2.  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 Craige Cotta for left upper lobe lung mass. - Is current active smoker, 1/2 pack/day for 35 years. - CT lung cancer screening scan on 10/16/2020 showed cavitary mass in the anterior left upper lobe measuring 4.4 x 3.0 x 3.5 cm with 2.5 cm solid nodular component inferomedially.  This abuts and involves the anterior pleura/chest wall.  Expansile lytic lesion in the right posterolateral ninth and 10th ribs (unchanged dating back to CT from 06/16/2016).  10  mm short axis AP window node suspicious for nodal metastasis. - No change in baseline cough.  No hemoptysis.  No chest pain. - Brain MRI was negative for metastatic disease. - PET scan on 12/14/2020 showed left upper lobe mass measuring 3.9 x 4 cm with SUV 17.1.  Some contiguous hypermetabolism along the left chest wall including anterolateral left third rib with SUV 5.4.  No gross osseous destruction.  AP window lymph node of 9 mm, mildly hypermetabolic, SUV 2.8. - On 12/23/2020 left upper lobe brushing and lavage consistent with squamous cell carcinoma. - Left upper lobe fine-needle aspiration was also consistent with squamous cell carcinoma.  Lymph node could not be reached. - Caris testing with limited tissue showed MMR proficient, BRAF V6 energy negative, PD-L1 negative by 22 C3, 28-8, SP142.  (IHC/CISH results should be interpreted with caution given the potential for false negative results) - 3 cycles of carboplatin, paclitaxel and nivolumab from 01/13/2021 through 02/24/2021 based on [id number]. - Left upper lobectomy and lymph node resection on 04/21/2021. - Pathology consistent with invasive moderately differentiated squamous cell carcinoma, 5 cm, margins negative.  Visceral pleura not involved.  Metastatic squamous cell carcinoma in 1/2 hilar lymph nodes.  Levels 5, 11, 12, 10, 11 and 12 lymph nodes were negative.  No definitive tumor response identified.  No lymphovascular invasion.  ypT2b, ypN1. - Caris testing on resection sample from 04/21/2021 shows PD-L1 TPS 1%.  No targetable mutations.  MSI stable.  TMB low.   2.  Social/family history: - Previously worked in Designer, fashion/clothing, currently on disability. - Current active smoker, 1/2 pack/day for 35 years.  Now smokes about a pack a day. - Drinks 6 pack of beers along with 1 quart of liquor every day. - 1 brother  has recurrent throat cancer and another brother died of throat cancer metastasized to the brain.  Plan:  1.  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 - Reviewed labs from 10/25/2023: Normal LFTs and creatinine.  CBC with normal white count and platelet count.  Has anemia with hemoglobin 10.3.  Ferritin was 319, percent saturation 24, normal folic acid and B12.  Will continue to monitor.  TSH is normal. - Reviewed CT soft tissue neck from 11/13/2023: No evidence of malignancy or lymphadenopathy.  Other benign findings discussed. - He has an upcoming PET scan in May.  He will follow-up with Dr. Lurena Sally. - RT 3 months for follow-up with repeat labs.  2.  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 - CT chest from 09/14/2023: No evidence of recurrence.  3.  Nutrition: - Continue 3 cans of Osmolite 1.5/day via PEG tube.  Also drinking 2 cans of Ensure 350 cal/day. - He reports odynophagia to solid foods.  He is eating soft foods. - Continue follow-up with speech pathologist. - Will try to wean him off of oxycodone to every 8 hours. - He also has persistent thrush.  Will give Diflucan.   4.  Neuropathy: - He has numbness in the hands and feet which is stable without any neuropathic pains.    Orders Placed This Encounter  Procedures   NM PET Image Restag (PS) Skull Base To Thigh    Standing Status:   Future    Expected Date:   01/04/2024    Expiration Date:   11/19/2024    If indicated for the ordered procedure, I authorize the administration of a radiopharmaceutical per Radiology protocol:   Yes    Preferred imaging location?:   [facility]     I,Helena R Teague,acting as a scribe for [name], MD.,have documented all relevant documentation on the behalf of [name], MD,as directed by  [name], MD while in the presence of [name], MD.  I, [name] MD, have reviewed the above documentation for accuracy and completeness, and I agree with  the above.     [name], MD   4/14/202511:40 AM  CHIEF COMPLAINT:   Diagnosis: bronchogenic carcnioma in left upper lobe of lung    Cancer Staging  Cancer of soft palate (HCC) Staging form: Pharynx - P16 Negative Oropharynx, AJCC 8th Edition - Clinical stage from 07/19/2023: Stage III (cT3, cN1, cM0, p16-) - Signed by [name], MD on 07/19/2023  Primary lung squamous cell carcinoma, left (HCC) Staging form: Lung, AJCC 8th Edition - Clinical stage from 12/31/2020: Stage IIIA (cT2b, cN2, cM0) - Signed by [name], MD on 01/01/2021  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 [facility]) Staging form: Larynx - Supraglottis, AJCC 8th Edition - Clinical: Stage III (cT3, cN1, cM0) - Signed by [name], MD on 09/08/2023    Prior Therapy: carboplatin, paclitaxel and nivolumab q21d x3 cycles   Current Therapy:  surveillance    HISTORY OF PRESENT ILLNESS:   Oncology History  Primary lung squamous cell carcinoma, left (HCC)  12/31/2020 Cancer Staging   Staging form: Lung, AJCC 8th Edition - Clinical stage from 12/31/2020: Stage IIIA (cT2b, cN2, cM0) - Signed by [name], MD on 01/01/2021 Stage prefix: Initial diagnosis   01/01/2021 Initial Diagnosis   Primary lung squamous cell carcinoma, left (HCC)   01/13/2021 -  02/24/2021 Chemotherapy   Patient is on Treatment Plan : LUNG NSCLC Carboplatin / Paclitaxel q21d x 3 cycles     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)  07/19/2023 Initial Diagnosis   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)   07/19/2023 Cancer Staging   Staging form: Larynx - Supraglottis, AJCC 8th Edition - Clinical: Stage III (cT3, cN1, cM0) - Signed by [name], MD on 09/08/2023   Cancer of soft palate (HCC)  07/19/2023 Initial Diagnosis   Cancer of soft palate (HCC)   07/19/2023 Cancer Staging   Staging form: Pharynx - P16 Negative Oropharynx, AJCC 8th Edition -  Clinical stage from 07/19/2023: Stage III (cT3, cN1, cM0, p16-) - Signed by Lonie Peak, MD on 07/19/2023 Stage prefix: Initial diagnosis   07/31/2023 -  Chemotherapy   Patient is on Treatment Plan : HEAD/NECK Cisplatin (40) q7d     Malignant neoplasm of supraglottis (HCC) (Resolved)  07/19/2023 Initial Diagnosis   Malignant neoplasm of supraglottis (HCC)      INTERVAL HISTORY:   Bryan Fleming is a 63 y.o. male presenting to clinic today for follow up of bronchogenic carcnioma in left upper lobe of lung. He was last seen by me on 09/21/23.  Since his last visit, he underwent a swallow function study on 10/18/23. He also had CT of the neck on 11/13/23 that found: No appreciable residual supraglottic mass. No pathologically enlarged cervical chain lymph nodes. Paranasal sinus disease as described. There is poor dentition with multifocal periodontal disease, and the described bilateral maxillary sinus mucosal thickening could reflect odontogenic sinusitis.  Prominent atherosclerotic plaque about the carotid bifurcations bilaterally. Concern for high-grade proximal left internal carotid artery stenosis. A hemodynamically significant stenosis of the proximal right internal carotid artery is also possible.  Of note, Naaman was presented to the ED  on 10/24/23 for G tube issues and had his G tube replaced.  Today, he states that he is doing well overall. His appetite level is at 70%. His energy level is at 70%.  PAST MEDICAL HISTORY:   Past Medical History: Past Medical History:  Diagnosis Date   Cancer (HCC)    Lung cancer   COPD (chronic obstructive pulmonary disease) (HCC) 2007   EMPHYSEMA   Depression    Dyspnea    occasional - no oxygen   GERD (gastroesophageal reflux disease) 2010   Hepatitis    Alcoholic    HLD (hyperlipidemia)    no meds   Hypertension    Port-A-Cath in place 01/01/2021    Surgical History: Past Surgical History:  Procedure Laterality Date   BIOPSY  05/15/2023    Procedure: BIOPSY;  Surgeon: Lemar Lofty., MD;  Location: [facility] ENDOSCOPY;  Service: Gastroenterology;;   BRONCHIAL BIOPSY  12/23/2020   Procedure: BRONCHIAL BIOPSIES;  Surgeon: Leslye Peer, MD;  Location: [facility] ENDOSCOPY;  Service: Pulmonary;;   BRONCHIAL BRUSHINGS  12/23/2020   Procedure: BRONCHIAL BRUSHINGS;  Surgeon: Leslye Peer, MD;  Location: [facility] ENDOSCOPY;  Service: Pulmonary;;   BRONCHIAL NEEDLE ASPIRATION BIOPSY  12/23/2020   Procedure: BRONCHIAL NEEDLE ASPIRATION BIOPSIES;  Surgeon: Leslye Peer, MD;  Location: [facility] ENDOSCOPY;  Service: Pulmonary;;   BRONCHIAL WASHINGS  12/23/2020   Procedure: BRONCHIAL WASHINGS;  Surgeon: Leslye Peer, MD;  Location: [facility] ENDOSCOPY;  Service: Pulmonary;;   CARDIAC CATHETERIZATION  2010   COLONOSCOPY  03/14/2012   Procedure: COLONOSCOPY;  Surgeon: Malissa Hippo, MD;  Location: [facility];  Service: Endoscopy;  Laterality: N/A;  100   ESOPHAGOGASTRODUODENOSCOPY (EGD) WITH PROPOFOL N/A 05/15/2023   Procedure: ESOPHAGOGASTRODUODENOSCOPY (EGD) WITH PROPOFOL;  Surgeon: Meridee Score Netty Starring., MD;  Location: [facility] ENDOSCOPY;  Service: Gastroenterology;  Laterality: N/A;   EUS N/A 05/15/2023   Procedure: UPPER ENDOSCOPIC ULTRASOUND (EUS) RADIAL;  Surgeon: Lemar Lofty., MD;  Location: [facility] ENDOSCOPY;  Service: Gastroenterology;  Laterality: N/A;   INTERCOSTAL NERVE BLOCK  04/21/2021   Procedure: INTERCOSTAL NERVE BLOCK;  Surgeon: Loreli Slot, MD;  Location: [facility] OR;  Service: Thoracic;;   IR GASTROSTOMY TUBE MOD SED  07/27/2023   IR IMAGING GUIDED PORT INSERTION  01/12/2021   IR REPLACE G-TUBE SIMPLE WO FLUORO  10/24/2023   IR REPLC GASTRO/COLONIC TUBE PERCUT W/FLUORO  10/19/2023   LARYNGOSCOPY N/A 07/04/2023   Procedure: LARYNGOSCOPY WITH BIOPSIES;  Surgeon: Laren Boom, DO;  Location: [facility];  Service: ENT;  Laterality: N/A;   LUNG REMOVAL, PARTIAL  08/2021   PENILE PROSTHESIS IMPLANT N/A  02/17/2022   Procedure: PENILE PROTHESIS INFLATABLE;  Surgeon: [name], MD;  Location: [facility] ORS;  Service: Urology;  Laterality: N/A;   PORTACATH PLACEMENT  07/2021   REMOVAL OF PENILE PROSTHESIS N/A 10/13/2022   Procedure: REMOVAL OF PENILE PROSTHESIS;  Surgeon: [name], MD;  Location: [facility] ORS;  Service: Urology;  Laterality: N/A;   REMOVAL OF PENILE PROSTHESIS N/A 12/05/2022   Procedure: REMOVAL AND REPLACEMENT OF PENILE PROSTHESIS;  Surgeon: [name], MD;  Location: [facility] ORS;  Service: Urology;  Laterality: N/A;  120 MINUTES NEEDED FOR CASE   VIDEO ASSISTED THORACOSCOPY (VATS)/ LOBECTOMY Left 04/21/2021   Procedure: VIDEO ASSISTED THORACOSCOPY (VATS)/ LOBECTOMY;  Surgeon: Loreli Slot, MD;  Location: [facility] OR;  Service: Thoracic;  Laterality: Left;   VIDEO BRONCHOSCOPY WITH ENDOBRONCHIAL NAVIGATION N/A 12/23/2020   Procedure: VIDEO BRONCHOSCOPY WITH ENDOBRONCHIAL NAVIGATION;  Surgeon: Leslye Peer, MD;  Location: [facility] ENDOSCOPY;  Service: Pulmonary;  Laterality: N/A;   WRIST GANGLION EXCISION  1985    Social History: Social History   Socioeconomic History   Marital status: Married    Spouse name: Not on file   Number of children: Not on file   Years of education: Not on file   Highest education level: Not on file  Occupational History   Not on file  Tobacco Use   Smoking status: Every Day    Current packs/day: 1.00    Average packs/day: 1 pack/day for 37.0 years (37.0 ttl pk-yrs)    Types: Cigarettes   Smokeless tobacco: Never   Tobacco comments:    smokes 1/2 pack a day [facility] 03/08/21  Vaping Use   Vaping status: Never Used  Substance and Sexual Activity   Alcohol use: Not Currently    Alcohol/week: 56.0 standard drinks of alcohol    Types: 35 Cans of beer, 21 Shots of liquor per week    Comment: 5-6 beers and 2-3 shots daily-04/19/21   Drug use: No   Sexual activity: Yes  Other Topics Concern   Not on file  Social History Narrative   Not  on file   Social Drivers of Health   Financial Resource Strain: Not on file  Food Insecurity: Food Insecurity Present (07/19/2023)   Hunger Vital Sign    Worried About Running Out of Food in the Last Year: Sometimes true    Ran Out of Food in the Last Year: Sometimes true  Transportation Needs: No Transportation Needs (07/19/2023)   PRAPARE - Transportation  Lack of Transportation (Medical): No    Lack of Transportation (Non-Medical): No  Physical Activity: Inactive (12/03/2020)   Exercise Vital Sign    Days of Exercise per Week: 0 days    Minutes of Exercise per Session: 0 min  Stress: Not on file  Social Connections: Not on file  Intimate Partner Violence: Not At Risk (07/19/2023)   Humiliation, Afraid, Rape, and Kick questionnaire    Fear of Current or Ex-Partner: No    Emotionally Abused: No    Physically Abused: No    Sexually Abused: No    Family History: Family History  Problem Relation Age of Onset   Hypertension Mother    Asthma Father    Diabetes Sister    Hypertension Sister    Hypertension Brother    Hypertension Sister    Lupus Daughter     Current Medications:  Current Outpatient Medications:    acetaminophen (TYLENOL) 500 MG tablet, Take 2 tablets (1,000 mg total) by mouth every 6 (six) hours., Disp: 45 tablet, Rfl: 0   albuterol (VENTOLIN HFA) 108 (90 Base) MCG/ACT inhaler, Inhale 1-2 puffs into the lungs every 6 (six) hours as needed for wheezing or shortness of breath., Disp: , Rfl:    ALPRAZolam (XANAX) 0.5 MG tablet, Take 1 tablet (0.5 mg total) by mouth at bedtime as needed for anxiety., Disp: 30 tablet, Rfl: 0   amLODipine (NORVASC) 5 MG tablet, Take 5 mg by mouth in the morning., Disp: , Rfl:    amoxicillin-clavulanate (AUGMENTIN) 875-125 MG tablet, Take 1 tablet by mouth every 12 (twelve) hours., Disp: 14 tablet, Rfl: 0   Fluticasone-Umeclidin-Vilant (TRELEGY ELLIPTA) 100-62.5-25 MCG/INH AEPB, Inhale 1 puff into the lungs in the morning., Disp:  , Rfl:    lidocaine (XYLOCAINE) 2 % solution, Patient: Mix 1part 2% viscous lidocaine, 1part H20. Swish & swallow 10mL of diluted mixture, before meals and at bedtime, up to QID, Disp: 480 mL, Rfl: 3   lidocaine-prilocaine (EMLA) cream, Apply to affected area once, Disp: 30 g, Rfl: 3   loratadine (CLARITIN) 10 MG tablet, Take 10 mg by mouth daily., Disp: , Rfl:    magnesium oxide (MAG-OX) 400 (240 Mg) MG tablet, Take 1 tablet (400 mg total) by mouth daily., Disp: 30 tablet, Rfl: 0   metoprolol tartrate (LOPRESSOR) 50 MG tablet, Take 50 mg by mouth 2 (two) times daily., Disp: , Rfl:    Multiple Vitamin (MULTIVITAMIN WITH MINERALS) TABS tablet, Take 1 tablet by mouth in the morning., Disp: , Rfl:    Nutritional Supplements (FEEDING SUPPLEMENT, OSMOLITE 1.5 CAL,) LIQD, Give 2 cartons at 8am and noon via feeding tube.  Give 1 carton at 4pm and 8pm.  Flush with 60ml of water before and after each feeding.  Give additional 3 cups of water via tube for hydration if unable to drink orally., Disp: , Rfl:    ondansetron (ZOFRAN) 8 MG tablet, Take 1 tablet (8 mg total) by mouth every 8 (eight) hours as needed for nausea or vomiting. Start on the third day after cisplatin., Disp: 30 tablet, Rfl: 1   Oxycodone HCl 20 MG TABS, Take 1 tablet (20 mg total) by mouth every 6 (six) hours as needed., Disp: 90 tablet, Rfl: 0   potassium chloride SA (KLOR-CON M) 20 MEQ tablet, Take 1 tablet (20 mEq total) by mouth 2 (two) times daily., Disp: 60 tablet, Rfl: 1   prochlorperazine (COMPAZINE) 10 MG tablet, Take 1 tablet (10 mg total) by mouth every 6 (six)  hours as needed (Nausea or vomiting)., Disp: 30 tablet, Rfl: 1   sodium fluoride (FLUORISHIELD) 1.1 % GEL dental gel, Place 1 Application onto teeth at bedtime., Disp: , Rfl:    Zinc Oxide (TRIPLE PASTE) 12.8 % ointment, Apply 1 Application topically as needed for irritation., Disp: 56.7 g, Rfl: 0   fluconazole (DIFLUCAN) 100 MG tablet, TAKE 2 TABLETS BY MOUTH TODAY,  THEN 1 TABLET DAILY FOR 20 MORE DAYS, Disp: 22 tablet, Rfl: 0   pantoprazole (PROTONIX) 20 MG tablet, Take 1 tablet (20 mg total) by mouth 2 (two) times daily for 14 days., Disp: 28 tablet, Rfl: 0   sucralfate (CARAFATE) 1 g tablet, Take 1 tablet (1 g total) by mouth with breakfast, with lunch, and with evening meal for 7 days., Disp: 21 tablet, Rfl: 0   Allergies: No Known Allergies   REVIEW OF SYSTEMS:   Review of Systems  Constitutional:  Negative for chills, fatigue and fever.  HENT:   Positive for trouble swallowing. Negative for lump/mass, mouth sores, nosebleeds and sore throat.   Eyes:  Negative for eye problems.  Respiratory:  Positive for cough and shortness of breath.   Cardiovascular:  Negative for chest pain, leg swelling and palpitations.  Gastrointestinal:  Negative for abdominal pain, constipation, diarrhea, nausea and vomiting.  Genitourinary:  Negative for bladder incontinence, difficulty urinating, dysuria, frequency, hematuria and nocturia.   Musculoskeletal:  Negative for arthralgias, back pain, flank pain, myalgias and neck pain.  Skin:  Negative for itching and rash.  Neurological:  Positive for numbness. Negative for dizziness and headaches.  Hematological:  Does not bruise/bleed easily.  Psychiatric/Behavioral:  Positive for sleep disturbance. Negative for depression and suicidal ideas. The patient is not nervous/anxious.   All other systems reviewed and are negative.    VITALS:   Blood pressure 117/74, pulse (!) 125, temperature (!) 97.2 F (36.2 C), temperature source Tympanic, resp. rate 20, height 5\' 9"  (1.753 m), weight 129 lb 3.2 oz (58.6 kg), SpO2 100%.  Wt Readings from Last 3 Encounters:  11/20/23 129 lb 3.2 oz (58.6 kg)  10/15/23 132 lb (59.9 kg)  10/04/23 132 lb (59.9 kg)    Body mass index is 19.08 kg/m.  Performance status (ECOG): 1 - Symptomatic but completely ambulatory  PHYSICAL EXAM:   Physical Exam Vitals and nursing note reviewed.  Exam conducted with a chaperone present.  Constitutional:      Appearance: Normal appearance.  Cardiovascular:     Rate and Rhythm: Normal rate and regular rhythm.     Pulses: Normal pulses.     Heart sounds: Normal heart sounds.  Pulmonary:     Effort: Pulmonary effort is normal.     Breath sounds: Normal breath sounds.  Abdominal:     Palpations: Abdomen is soft. There is no hepatomegaly, splenomegaly or mass.     Tenderness: There is no abdominal tenderness.  Musculoskeletal:     Right lower leg: No edema.     Left lower leg: No edema.  Lymphadenopathy:     Cervical: No cervical adenopathy.     Right cervical: No superficial, deep or posterior cervical adenopathy.    Left cervical: No superficial, deep or posterior cervical adenopathy.     Upper Body:     Right upper body: No supraclavicular or axillary adenopathy.     Left upper body: No supraclavicular or axillary adenopathy.  Neurological:     General: No focal deficit present.     Mental Status: He is alert  and oriented to person, place, and time.  Psychiatric:        Mood and Affect: Mood normal.        Behavior: Behavior normal.     LABS:      Latest Ref Rng & Units 10/25/2023    2:28 PM 09/21/2023    3:56 PM 09/19/2023    1:50 PM  CBC  WBC 4.0 - 10.5 K/uL 9.3  7.0  6.5   Hemoglobin 13.0 - 17.0 g/dL 95.6  21.3  9.9   Hematocrit 39.0 - 52.0 % 31.5  31.1  29.2   Platelets 150 - 400 K/uL 250  198  218       Latest Ref Rng & Units 10/25/2023    2:28 PM 09/21/2023    3:56 PM 09/19/2023    1:49 PM  CMP  Glucose 70 - 99 mg/dL 086  578  469   BUN 8 - 23 mg/dL 20  11  11    Creatinine 0.61 - 1.24 mg/dL 6.29  5.28  4.13   Sodium 135 - 145 mmol/L 135  134  134   Potassium 3.5 - 5.1 mmol/L 3.4  3.9  3.9   Chloride 98 - 111 mmol/L 93  97  97   CO2 22 - 32 mmol/L 25  26  29    Calcium 8.9 - 10.3 mg/dL 9.6  9.2  9.2   Total Protein 6.5 - 8.1 g/dL 7.5  7.4    Total Bilirubin 0.0 - 1.2 mg/dL 0.2  0.4    Alkaline Phos 38 -  126 U/L 73  62    AST 15 - 41 U/L 20  17    ALT 0 - 44 U/L 11  10       No results found for: "CEA1", "CEA" / No results found for: "CEA1", "CEA" No results found for: "PSA1" No results found for: "KGM010" No results found for: "CAN125"  No results found for: "TOTALPROTELP", "ALBUMINELP", "A1GS", "A2GS", "BETS", "BETA2SER", "GAMS", "MSPIKE", "SPEI" Lab Results  Component Value Date   TIBC 221 (L) 09/21/2023   FERRITIN 319 09/21/2023   IRONPCTSAT 24 09/21/2023   No results found for: "LDH"   STUDIES:   CT SOFT TISSUE NECK W CONTRAST Result Date: 11/13/2023 CLINICAL DATA:  Provided history: Malignant neoplasm of supraglottis. Other iron deficiency anemia. Head/neck cancer, monitor. Additional history obtained from electronic MEDICAL RECORD NUMBERStatus post chemoradiation therapy 07/31/2023-09/19/2023. EXAM: CT NECK WITH CONTRAST TECHNIQUE: Multidetector CT imaging of the neck was performed using the standard protocol following the bolus administration of intravenous contrast. RADIATION DOSE REDUCTION: This exam was performed according to the departmental dose-optimization program which includes automated exposure control, adjustment of the mA and/or kV according to patient size and/or use of iterative reconstruction technique. CONTRAST:  75mL OMNIPAQUE IOHEXOL 300 MG/ML  SOLN COMPARISON:  PET CT 07/13/2023.  Neck CT 06/19/2023. FINDINGS: Pharynx and larynx: No residual supraglottic mass is appreciated. Streak/beam hardening artifact arising from dental restoration partially obscures the oral cavity. Within this limitation, no appreciable swelling or mass elsewhere within the oral cavity, pharynx or larynx Salivary glands: No inflammation, mass, or stone. Thyroid: Unremarkable. Lymph nodes: No pathologically enlarged cervical chain lymph nodes (with special attention to the site of previous right level III lymphadenopathy). Vascular: Partially imaged right chest infusion port catheter. Atherosclerotic  plaque within the visualized thoracic aorta and proximal major branch vessels of the neck. Prominent atherosclerotic plaque about the carotid bifurcations bilaterally. Concern for high-grade stenosis of the proximal  left internal carotid artery. A hemodynamically significant stenosis of the proximal right internal carotid artery is also possible. Plaque also noted within the intracranial internal carotid arteries. Limited intracranial: No evidence of an acute intracranial abnormality within the field of view. Visualized orbits: No orbital mass or acute orbital finding. Mastoids and visualized paranasal sinuses: Portions of the frontal sinuses are excluded from the field of view superiorly. Moderate mucosal thickening within the right frontal sinus at the imaged levels. Small-volume frothy secretions within the right sphenoid sinus. Severe left sphenoid sinusitis. Mucosal thickening within the bilateral maxillary sinuses (moderate right, mild left). No significant mastoid effusion. Skeleton: Poor dentition. Multifocal periapical lucencies consistent with periodontal disease. Elsewhere, no acute fracture or aggressive osseous lesion. Upper chest: Emphysema.  Prior left upper lobectomy. IMPRESSION: 1. No appreciable residual supraglottic mass. 2. No pathologically enlarged cervical chain lymph nodes. 3. Paranasal sinus disease as described. There is poor dentition with multifocal periodontal disease, and the described bilateral maxillary sinus mucosal thickening could reflect odontogenic sinusitis. 4. Prominent atherosclerotic plaque about the carotid bifurcations bilaterally. Concern for high-grade proximal left internal carotid artery stenosis. A hemodynamically significant stenosis of the proximal right internal carotid artery is also possible. Carotid artery duplex or neck CTA recommended for further evaluation. 5. Aortic Atherosclerosis (ICD10-I70.0) and Emphysema (ICD10-J43.9). Electronically Signed   By: Bascom Lily D.O.   On: 11/13/2023 13:44   IR REPLACE G-TUBE SIMPLE WO FLUORO Result Date: 10/24/2023 EXAM: ESTABLISHED PATIENT OFFICE VISIT CHIEF COMPLAINT: See Epic note. HISTORY OF PRESENT ILLNESS: See Epic note. REVIEW OF SYSTEMS: See Epic note. PHYSICAL EXAMINATION: See Epic note. ASSESSMENT AND PLAN: See Epic note. Creasie Doctor, MD [facility] Radiology Electronically Signed   By: Creasie Doctor M.D.   On: 10/24/2023 15:25

## 2023-11-20 NOTE — Patient Instructions (Addendum)
 [facility] at [facility] Discharge Instructions   You were seen and examined today by Dr. Cheree Cords.  He reviewed the results of your lab work which are normal/stable.   He reviewed the results of your CT scan which did not show any evidence of cancer.   We will see you back in 8 weeks. We will repeat a PET scan and lab work prior to this visit.    Return as scheduled.    Thank you for choosing [facility] at [facility] to provide your oncology and hematology care.  To afford each patient quality time with our provider, please arrive at least 15 minutes before your scheduled appointment time.   If you have a lab appointment with the Cancer Center please come in thru the Main Entrance and check in at the main information desk.  You need to re-schedule your appointment should you arrive 10 or more minutes late.  We strive to give you quality time with our providers, and arriving late affects you and other patients whose appointments are after yours.  Also, if you no show three or more times for appointments you may be dismissed from the clinic at the providers discretion.     Again, thank you for choosing [facility].  Our hope is that these requests will decrease the amount of time that you wait before being seen by our physicians.       _____________________________________________________________  Should you have questions after your visit to [facility], please contact our office at [phone] and follow the prompts.  Our office hours are 8:00 a.m. and 4:30 p.m. Monday - Friday.  Please note that voicemails left after 4:00 p.m. may not be returned until the following business day.  We are closed weekends and major holidays.  You do have access to a nurse 24-7, just call the main number to the clinic [phone] and do not press any options, hold on the line and a nurse will answer the phone.    For prescription  refill requests, have your pharmacy contact our office and allow 72 hours.

## 2023-11-21 ENCOUNTER — Other Ambulatory Visit: Payer: Self-pay

## 2023-11-24 ENCOUNTER — Encounter: Payer: Self-pay | Admitting: Oncology

## 2023-12-01 ENCOUNTER — Encounter: Payer: Self-pay | Admitting: Oncology

## 2023-12-04 ENCOUNTER — Inpatient Hospital Stay

## 2023-12-04 NOTE — Progress Notes (Signed)
 Nutrition Follow-up:  Patient with SCC of oropharynx (soft palate, supraglottis with right lymph node involvement).  Patient has completed chemotherapy and radiation (07/31/2023-09/19/2023).  Following with Dr Katragadda at this time.  Spoke with patient via phone.  Reports that he is giving 3-4 cartons of osmolite 1.5 via feeding tube daily.  Trying to eat by mouth but food has no taste/bland.  He is currently taking difllucan for thrush.  Says that he is drinking orally 2 ensure per day.  He is also eating soft foods (mashed potatoes, eggs, oatmeal, meat sandwiches, chicken salad.  Says that he does not have any problems swallowing, still has some pain in mouth.      Medications: reviewed  Labs: reviewed  Anthropometrics:   Weight 129 lb 3.2 oz on 4/14 132 lb on 3/9 131 lb 13.4 oz on 2/13 140 lb 8 oz on 1/17 137 lb 8 oz on 1/13 144 lb 8 oz on 12/23 141 lb on 12/19  Estimated Energy Needs   Kcals: 2000-2300 Protein: 78-98 g Fluid: > 2 L  NUTRITION DIAGNOSIS: Sub optimal energy intake continues    INTERVENTION:  Continue osmolite 1.5 3-4 cartons per day due to lack of taste. Flush with 60ml of water  before and after. Phone number given to patient to call [employer] and order more formula Also discussed where to purchase split gauze Continue drinking oral nutrition supplement shakes at least 2 per day Continue eating soft foods orally with goal or weaning down tube feeding once taste improves    MONITORING, EVALUATION, GOAL: weight trends, intake, tube feeding   NEXT VISIT: Tuesday, May 20 after radiation follow-up  Mikka Kissner B. Zollie Hipp, CSO, LDN Registered Dietitian [phone]

## 2023-12-09 DIAGNOSIS — K219 Gastro-esophageal reflux disease without esophagitis: Secondary | ICD-10-CM | POA: Diagnosis not present

## 2023-12-09 DIAGNOSIS — I10 Essential (primary) hypertension: Secondary | ICD-10-CM | POA: Diagnosis not present

## 2023-12-11 DIAGNOSIS — C321 Malignant neoplasm of supraglottis: Secondary | ICD-10-CM | POA: Diagnosis not present

## 2023-12-11 DIAGNOSIS — C051 Malignant neoplasm of soft palate: Secondary | ICD-10-CM | POA: Diagnosis not present

## 2023-12-11 DIAGNOSIS — C328 Malignant neoplasm of overlapping sites of larynx: Secondary | ICD-10-CM | POA: Diagnosis not present

## 2023-12-13 ENCOUNTER — Ambulatory Visit (HOSPITAL_COMMUNITY)
Admission: RE | Admit: 2023-12-13 | Discharge: 2023-12-13 | Disposition: A | Discharge status: Home / Self Care | Source: Ambulatory Visit | Attending: Gerontology | Admitting: Gerontology

## 2023-12-13 ENCOUNTER — Other Ambulatory Visit (HOSPITAL_COMMUNITY): Payer: Self-pay | Admitting: Gerontology

## 2023-12-13 DIAGNOSIS — M2560 Stiffness of unspecified joint, not elsewhere classified: Secondary | ICD-10-CM

## 2023-12-13 DIAGNOSIS — M25512 Pain in left shoulder: Secondary | ICD-10-CM

## 2023-12-13 DIAGNOSIS — M25571 Pain in right ankle and joints of right foot: Secondary | ICD-10-CM | POA: Diagnosis not present

## 2023-12-13 DIAGNOSIS — M19012 Primary osteoarthritis, left shoulder: Secondary | ICD-10-CM | POA: Diagnosis not present

## 2023-12-13 DIAGNOSIS — I10 Essential (primary) hypertension: Secondary | ICD-10-CM | POA: Diagnosis not present

## 2023-12-13 DIAGNOSIS — M25572 Pain in left ankle and joints of left foot: Secondary | ICD-10-CM | POA: Insufficient documentation

## 2023-12-13 DIAGNOSIS — F411 Generalized anxiety disorder: Secondary | ICD-10-CM | POA: Diagnosis not present

## 2023-12-14 ENCOUNTER — Encounter (HOSPITAL_COMMUNITY)
Admission: RE | Admit: 2023-12-14 | Discharge: 2023-12-14 | Disposition: A | Discharge status: Home / Self Care | Source: Ambulatory Visit | Attending: Radiation Oncology | Admitting: Radiation Oncology

## 2023-12-14 DIAGNOSIS — C760 Malignant neoplasm of head, face and neck: Secondary | ICD-10-CM | POA: Diagnosis not present

## 2023-12-14 DIAGNOSIS — C328 Malignant neoplasm of overlapping sites of larynx: Secondary | ICD-10-CM | POA: Diagnosis not present

## 2023-12-14 MED ORDER — FLUDEOXYGLUCOSE F - 18 (FDG) INJECTION
6.9900 | Freq: Once | INTRAVENOUS | Status: AC | PRN
  Administered 2023-12-14: 6.99 via INTRAVENOUS

## 2023-12-19 NOTE — Progress Notes (Addendum)
 Bryan Fleming presents to the clinic today for a follow up and to review PET scan results. He completed radiation for malignant neoplasm of the soft palate on 09/19/2023.  12/14/2023 PET Scan   Pain issues, if any: Yes, patient does have some intermittent throat pain 7 out of 10 pain in the morning.  Foods don't taste the same, so it limits the foods he can eat. Eats mostly soft foods. Coughing a lot. Using a feeding tube?: Yes, patient does have a feeding tube Weight changes, if any: Weight has been fluctuating throughout the time since he completed radiation. Wt. Readings from the last 3 encounters 12/26/23 11/20/23 129 lbs 3.2 oz 10/04/23 132.0 lbs  Swallowing issues, if any: Patient denies.  Smoking or chewing tobacco? Yes, patient smokes Using fluoride  toothpaste daily? Yes Last ENT visit was on: November, 2024 Other notable issues, if any:  None

## 2023-12-26 ENCOUNTER — Ambulatory Visit
Admission: RE | Admit: 2023-12-26 | Discharge: 2023-12-26 | Disposition: A | Discharge status: Home / Self Care | Payer: Self-pay | Source: Ambulatory Visit | Attending: Radiology | Admitting: Radiology

## 2023-12-26 ENCOUNTER — Inpatient Hospital Stay: Attending: Radiation Oncology | Admitting: Dietician

## 2023-12-26 VITALS — Ht 69.0 in

## 2023-12-26 DIAGNOSIS — C051 Malignant neoplasm of soft palate: Secondary | ICD-10-CM | POA: Diagnosis not present

## 2023-12-26 DIAGNOSIS — C3492 Malignant neoplasm of unspecified part of left bronchus or lung: Secondary | ICD-10-CM

## 2023-12-26 DIAGNOSIS — C321 Malignant neoplasm of supraglottis: Secondary | ICD-10-CM | POA: Diagnosis not present

## 2023-12-26 DIAGNOSIS — C328 Malignant neoplasm of overlapping sites of larynx: Secondary | ICD-10-CM | POA: Diagnosis not present

## 2023-12-26 NOTE — Progress Notes (Signed)
Patient did not show for nutrition appointment. 

## 2023-12-26 NOTE — Progress Notes (Addendum)
 Radiation Oncology         [phone] ________________________________  Name: Bryan Fleming MRN: [id number]  Date: 12/26/2023  DOB: [birth year]  Follow-Up Visit Note Initial Conducted via telephone at patient request.   I spoke with the patient to conduct this consult visit via telephone. The patient was notified in advance and was offered an in person or telemedicine meeting to allow for face to face communication but instead preferred to proceed with a telephone consult.   CC: [name], MD  [name], [name]*  Diagnosis and Prior Radiotherapy:  C05.1   ICD-10-CM   1. Cancer of soft palate (HCC)  C05.1     2. Primary lung squamous cell carcinoma, left (HCC)  C34.92        Cancer Staging  Cancer of soft palate (HCC) Staging form: Pharynx - P16 Negative Oropharynx, AJCC 8th Edition - Clinical stage from 07/19/2023: Stage III (cT3, cN1, cM0, p16-) - Signed by [name], MD on 07/19/2023 Stage prefix: Initial diagnosis  Primary lung squamous cell carcinoma, left (HCC) Staging form: Lung, AJCC 8th Edition - Clinical stage from 12/31/2020: Stage IIIA (cT2b, cN2, cM0) - Signed by [name], MD on 01/01/2021 Stage prefix: Initial diagnosis  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 [name]) Staging form: Larynx - Supraglottis, AJCC 8th Edition - Clinical: Stage III (cT3, cN1, cM0) - Signed by [name], MD on 09/08/2023  First Treatment Date: 2023-07-31 Last Treatment Date: 2023-09-19   Plan Name: HN_Throat Site: Laryngopharynx Technique: IMRT Mode: Photon Dose Per Fraction: 2 Gy Prescribed Dose (Delivered / Prescribed): 70 Gy / 70 Gy Prescribed Fxs (Delivered / Prescribed): 35 / 35   Stage III (cT3, N1, 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    [name] presents to the clinic today to review his most recent PET imaging. He completed radiation treatment for squamous cell carcinoma in the supraglottis and oropharynx on 09/19/2023.   PET on 12/14/2023 demonstrates improvement in the previous area of uptake along the supraglottic region on the right neck and in the adjacent level 3 lymph node. The stomach was noted to see wall thickening and stranding along the distal stomach with some potential ulcerations.  Patient is eating soft foods and is eating a wide variety of foods - patient was able to tolerate bologna sandwich and boiled chicken. He is drinking 2 ensures and using 4 cartons per day. He cannot taste foods, so it has been very difficult for him to maintain his weight. He continues to smoke approximately 1 ppd.    Ht 5\' 9"  (1.753 m)   BMI 19.08 kg/m   Wt Readings from Last 3 Encounters:  11/20/23 129 lb 3.2 oz (58.6 kg)  10/15/23 132 lb (59.9 kg)  10/04/23 132 lb (59.9 kg)     ALLERGIES:  has no known allergies.  Meds: Current Outpatient Medications  Medication Sig Dispense Refill   acetaminophen  (TYLENOL ) 500 MG tablet Take 2 tablets (1,000 mg total) by mouth every 6 (six) hours. 45 tablet 0   albuterol  (VENTOLIN  HFA) 108 (90 Base) MCG/ACT inhaler Inhale 1-2 puffs into the lungs every 6 (six) hours as needed for wheezing or shortness of breath.     ALPRAZolam  (XANAX ) 0.5 MG tablet Take 1 tablet (0.5 mg total) by mouth at bedtime as needed  for anxiety. 30 tablet 0   amLODipine  (NORVASC ) 5 MG tablet Take 5 mg by mouth in the morning.     amoxicillin -clavulanate (AUGMENTIN ) 875-125 MG tablet Take 1 tablet by mouth every 12 (twelve) hours. 14 tablet 0   fluconazole  (DIFLUCAN ) 100 MG tablet TAKE 2 TABLETS BY MOUTH TODAY, THEN 1 TABLET DAILY FOR 20 MORE DAYS 22 tablet 0   Fluticasone -Umeclidin-Vilant (TRELEGY ELLIPTA) 100-62.5-25 MCG/INH AEPB Inhale 1 puff into the lungs in the morning.      lidocaine  (XYLOCAINE ) 2 % solution Patient: Mix 1part 2% viscous lidocaine , 1part H20. Swish & swallow 10mL of diluted mixture, 30min before meals and at bedtime, up to QID 480 mL 3   lidocaine -prilocaine  (EMLA ) cream Apply to affected area once 30 g 3   loratadine (CLARITIN) 10 MG tablet Take 10 mg by mouth daily.     magnesium  oxide (MAG-OX) 400 (240 Mg) MG tablet Take 1 tablet (400 mg total) by mouth daily. 30 tablet 0   metoprolol  tartrate (LOPRESSOR ) 50 MG tablet Take 50 mg by mouth 2 (two) times daily.     Multiple Vitamin (MULTIVITAMIN WITH MINERALS) TABS tablet Take 1 tablet by mouth in the morning.     Nutritional Supplements (FEEDING SUPPLEMENT, OSMOLITE 1.5 CAL,) LIQD Give 2 cartons at 8am and noon via feeding tube.  Give 1 carton at 4pm and 8pm.  Flush with 60ml of water  before and after each feeding.  Give additional 3 cups of water  via tube for hydration if unable to drink orally.     ondansetron  (ZOFRAN ) 8 MG tablet Take 1 tablet (8 mg total) by mouth every 8 (eight) hours as needed for nausea or vomiting. Start on the third day after cisplatin . 30 tablet 1   Oxycodone  HCl 20 MG TABS Take 1 tablet (20 mg total) by mouth every 8 (eight) hours as needed. 90 tablet 0   pantoprazole  (PROTONIX ) 20 MG tablet Take 1 tablet (20 mg total) by mouth 2 (two) times daily for 14 days. 28 tablet 0   potassium chloride  SA (KLOR-CON  M) 20 MEQ tablet Take 1 tablet (20 mEq total) by mouth 2 (two) times daily. 60 tablet 1   prochlorperazine  (COMPAZINE ) 10 MG tablet Take 1 tablet (10 mg total) by mouth every 6 (six) hours as needed (Nausea or vomiting). 30 tablet 1   sodium fluoride  (FLUORISHIELD) 1.1 % GEL dental gel Place 1 Application onto teeth at bedtime.     sucralfate  (CARAFATE ) 1 g tablet Take 1 tablet (1 g total) by mouth with breakfast, with lunch, and with evening meal for 7 days. 21 tablet 0   Zinc  Oxide (TRIPLE PASTE) 12.8 % ointment Apply 1 Application topically as needed for irritation. 56.7 g  0   No current facility-administered medications for this encounter.    Physical Findings:  Deferred, due to nature of telephone visit.    Lab Findings: Lab Results  Component Value Date   WBC 9.3 10/25/2023   HGB 10.3 (L) 10/25/2023   HCT 31.5 (L) 10/25/2023   MCV 103.6 (H) 10/25/2023   PLT 250 10/25/2023    Lab Results  Component Value Date   TSH 1.039 10/25/2023    Radiographic Findings: NM PET Image Restag (PS) Skull Base To Thigh Result Date: 12/25/2023 CLINICAL DATA:  Treatment strategy for head neck cancer. Supraglottic. Status post chemotherapy and radiation. EXAM: NUCLEAR MEDICINE PET SKULL BASE TO THIGH TECHNIQUE: 6.99 mCi F-18 FDG was injected intravenously. Full-ring PET imaging was performed from the skull  base to thigh after the radiotracer. CT data was obtained and used for attenuation correction and anatomic localization. Fasting blood glucose: 147 mg/dl COMPARISON:  Neck CT 16/05/9603 chest abdomen pelvis CT 09/16/2023. PET-CT 07/13/2023. FINDINGS: Mediastinal blood pool activity: SUV max 1.2 Liver activity: SUV max 1.9 NECK: On the prior examination there is a right-sided supraglottic hypermetabolic mass within adjacent lymph node. The mass had a maximum SUV value of 8.0 on the previous. Only minimal uptake today maximum SUV 3.1, similar to other areas of mucosal uptake. On the prior there is adjacent level 3 hypermetabolic node which measured SUV of 8.9 which is no longer identified. Small node in this location is seen on image 45 measuring 3 mm in short axis. No new areas of abnormal uptake seen in the neck. There is diffuse physiologic muscle uptake identified. Incidental CT findings: Mastoid air cells are clear. There is opacification of the left sphenoid paranasal sinus. Mucosal thickening of the maxillary sinuses. Streak artifact related to the patient's dental hardware. The parotid glands, submandibular glands are unremarkable. Thyroid  gland is preserved. Diffuse  vascular calcifications. CHEST: Diffuse muscle uptake identified along the thorax. In addition along the upper extremities. No abnormal uptake above blood pool along lymph node change in the axillary regions, hilum or mediastinum. No abnormal lung uptake. Incidental CT findings: Right IJ chest port in place with tip along the central SVC near the right atrium. Trace pericardial fluid. Extensive coronary artery calcifications are seen. Surgical changes of left-sided lobectomy. Underlying emphysematous changes. No consolidation, pneumothorax or effusion. ABDOMEN/PELVIS: There is physiologic distribution radiotracer along the parenchymal organs, bowel and renal collecting systems. Incidental CT findings: Stomach is dilated with fluid and debris. There is a G-tube in place. The area of wall thickening and stranding along the distal stomach and proximal duodenum are less apparent today. Please correlate with clinical history. Duodenum is normal course and caliber. Gallstones. No renal or ureteral stones. Penile prosthesis. Large bowel has a normal course and caliber. Scattered colonic stool. Normal appendix in the right lower quadrant. SKELETON: No abnormal uptake along the visualized osseous structures. Incidental CT findings: Scattered mild degenerative changes. IMPRESSION: Previous area of uptake along the supraglottic region on the right in the neck has improved. The adjacent level 3 lymph node is improved. No new areas of abnormal uptake at this time. Stable surgical changes left upper lobectomy. Gallstones. G-tube in place. The stomach is distended with fluid and debris. On the prior CT scan there is wall thickening and stranding along the distal stomach with some potential ulcerations. This is less well seen on the current PET-CT scan. Please correlate with history. Electronically Signed   By: Adrianna Horde M.D.   On: 12/25/2023 11:01   DG Shoulder Left Result Date: 12/14/2023 CLINICAL DATA:  Pain and stiffness.  EXAM: LEFT SHOULDER - 2+ VIEW COMPARISON:  Left shoulder radiographs 06/07/2022; CT chest 09/14/2023 FINDINGS: Mild glenohumeral joint space narrowing and minimal inferior and posterior glenoid degenerative spurring, similar to prior. Normal glenohumeral and acromioclavicular alignment. No acute fracture or dislocation. Partial visualization of a central venous catheter, likely corresponding to right chest wall porta catheter with tip overlying the central superior vena cava. IMPRESSION: Mild glenohumeral osteoarthritis, similar to prior. Electronically Signed   By: Bertina Broccoli M.D.   On: 12/14/2023 17:43   DG Ankle Complete Right Result Date: 12/14/2023 CLINICAL DATA:  Pain and stiffness. EXAM: RIGHT ANKLE - COMPLETE 3+ VIEW COMPARISON:  None Available. FINDINGS: The ankle mortise is symmetric and  intact. Minimal distal medial malleolar and adjacent medial talar degenerative spurring. Moderate dorsal talar neck and minimal dorsal navicular degenerative osteophytosis. No acute fracture dislocation.  No soft tissue swelling. IMPRESSION: Degenerative spurring greatest at the dorsal talonavicular joint. Electronically Signed   By: Bertina Broccoli M.D.   On: 12/14/2023 17:40   DG Ankle Complete Left Result Date: 12/14/2023 CLINICAL DATA:  Pain and stiffness. EXAM: LEFT ANKLE COMPLETE - 3+ VIEW COMPARISON:  None Available. FINDINGS: The ankle mortise is symmetric and intact. Minimal distal posterior tibial plafond degenerative spurring. Joint spaces are preserved. No acute fracture or dislocation. IMPRESSION: No significant osteoarthritis. Electronically Signed   By: Bertina Broccoli M.D.   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 Lurena Sally personally reviewed this patient's imaging. PET demonstrates a positive response to treatment. We will discuss his case at our  tumor board tomorrow.   2) Nutritional Status: Encouraged patient to continue to push calories through oral intake. He is seeing nutrition later today Wt Readings from Last 3 Encounters:  11/20/23 129 lb 3.2 oz (58.6 kg)  10/15/23 132 lb (59.9 kg)  10/04/23 132 lb (59.9 kg)  PEG tube: using, about 4 cartons a day  3) Risk Factors: The patient has been educated about risk factors including alcohol  and tobacco abuse; they understand that avoidance of alcohol  and tobacco is important to prevent recurrences as well as other cancers. Patient is going to call 1800QUITNOW for nicotine  replacement to be sent to his house. He is not interested in picking a quit date at this time.   4) Swallowing: good function, continue SLP exercises   5) Dental: Encouraged to continue regular followup with dentistry, and dental hygiene including fluoride  rinses.  He has fluoride  gel from Dr Mamie Searles. [facility] previously gave him a Programmer, multimedia; however, he does not know where this went. We will resend this list to him.   6) Thyroid  function:  checking annually  Lab Results  Component Value Date   TSH 1.039 10/25/2023    8) Patient will follow-up with Dr. Westley Hammers in 1 month and us  in 4 months. He is scheduled to see Dr. Cheree Cords on 02/29/2024. The patient was encouraged to call with any issues or questions before then.   On date of service, in total, I spent 25 minutes on this encounter. Patient was seen in person. _____________________________________  This encounter was conducted via telephone.  The patient has provided two factor identification and has given verbal consent for this type of encounter and has been advised to only accept a meeting of this type in a secure network environment.  The time spent during this encounter was 25 minutes including preparation, discussion, and coordination of the patient's care  The attendants for this meeting include Julio Ohm PA-C and  patient During the encounter Julio Ohm PA-C was located at [facility] Radiation Oncology Department.  Patient was located at home.

## 2023-12-27 ENCOUNTER — Other Ambulatory Visit: Payer: Self-pay

## 2023-12-27 NOTE — Progress Notes (Signed)
 Oncology Nurse Navigator Documentation   At the request of Amiel Kalata PA, I have mailed Mr. Alfredo Inch a Best boy. Mr. Birdsall is welcome to call me at any time if he needs further assistance.   Lynetta Saran RN, BSN, OCN Head & Neck Oncology Nurse Navigator [facility] at [facility] Phone # [phone]  Fax # [phone]

## 2024-01-05 ENCOUNTER — Other Ambulatory Visit: Payer: Self-pay | Admitting: Oncology

## 2024-01-05 DIAGNOSIS — G893 Neoplasm related pain (acute) (chronic): Secondary | ICD-10-CM

## 2024-01-05 MED ORDER — OXYCODONE HCL 20 MG PO TABS: 20.0000 mg | ORAL_TABLET | Freq: Three times a day (TID) | ORAL | 0 refills | Status: DC | PRN

## 2024-01-08 ENCOUNTER — Other Ambulatory Visit: Payer: Self-pay | Admitting: *Deleted

## 2024-01-08 DIAGNOSIS — F17200 Nicotine dependence, unspecified, uncomplicated: Secondary | ICD-10-CM | POA: Diagnosis not present

## 2024-01-08 DIAGNOSIS — R0982 Postnasal drip: Secondary | ICD-10-CM | POA: Diagnosis not present

## 2024-01-08 DIAGNOSIS — C328 Malignant neoplasm of overlapping sites of larynx: Secondary | ICD-10-CM | POA: Diagnosis not present

## 2024-01-08 DIAGNOSIS — Z923 Personal history of irradiation: Secondary | ICD-10-CM | POA: Diagnosis not present

## 2024-01-09 DIAGNOSIS — K219 Gastro-esophageal reflux disease without esophagitis: Secondary | ICD-10-CM | POA: Diagnosis not present

## 2024-01-09 DIAGNOSIS — R7989 Other specified abnormal findings of blood chemistry: Secondary | ICD-10-CM | POA: Diagnosis not present

## 2024-01-09 DIAGNOSIS — K709 Alcoholic liver disease, unspecified: Secondary | ICD-10-CM | POA: Diagnosis not present

## 2024-01-09 DIAGNOSIS — E785 Hyperlipidemia, unspecified: Secondary | ICD-10-CM | POA: Diagnosis not present

## 2024-01-09 DIAGNOSIS — I10 Essential (primary) hypertension: Secondary | ICD-10-CM | POA: Diagnosis not present

## 2024-01-11 ENCOUNTER — Ambulatory Visit: Admitting: Hematology

## 2024-01-22 NOTE — Addendum Note (Signed)
 Encounter addended by: Karolynn Pack on: 01/22/2024 3:05 PM  Actions taken: Imaging Exam ended

## 2024-01-22 NOTE — Addendum Note (Signed)
 Encounter addended by: Karolynn Pack on: 01/22/2024 3:37 PM  Actions taken: Imaging Exam ended

## 2024-01-22 NOTE — Addendum Note (Signed)
 Encounter addended by: Karolynn Pack on: 01/22/2024 2:54 PM  Actions taken: Imaging Exam ended

## 2024-01-22 NOTE — Addendum Note (Signed)
 Encounter addended by: Karolynn Pack on: 01/22/2024 3:04 PM  Actions taken: Imaging Exam ended

## 2024-01-29 ENCOUNTER — Ambulatory Visit: Admitting: Orthopedic Surgery

## 2024-01-31 DIAGNOSIS — C328 Malignant neoplasm of overlapping sites of larynx: Secondary | ICD-10-CM | POA: Diagnosis not present

## 2024-01-31 DIAGNOSIS — C321 Malignant neoplasm of supraglottis: Secondary | ICD-10-CM | POA: Diagnosis not present

## 2024-01-31 DIAGNOSIS — C051 Malignant neoplasm of soft palate: Secondary | ICD-10-CM | POA: Diagnosis not present

## 2024-02-01 ENCOUNTER — Other Ambulatory Visit: Payer: Self-pay | Admitting: *Deleted

## 2024-02-01 DIAGNOSIS — C321 Malignant neoplasm of supraglottis: Secondary | ICD-10-CM

## 2024-02-01 DIAGNOSIS — G893 Neoplasm related pain (acute) (chronic): Secondary | ICD-10-CM

## 2024-02-01 MED ORDER — FLUCONAZOLE 100 MG PO TABS: ORAL_TABLET | ORAL | 0 refills | Status: AC

## 2024-02-01 MED ORDER — OXYCODONE HCL 20 MG PO TABS: 20.0000 mg | ORAL_TABLET | Freq: Three times a day (TID) | ORAL | 0 refills | Status: DC | PRN

## 2024-02-07 DIAGNOSIS — F325 Major depressive disorder, single episode, in full remission: Secondary | ICD-10-CM | POA: Diagnosis not present

## 2024-02-07 DIAGNOSIS — I739 Peripheral vascular disease, unspecified: Secondary | ICD-10-CM | POA: Diagnosis not present

## 2024-02-07 DIAGNOSIS — K219 Gastro-esophageal reflux disease without esophagitis: Secondary | ICD-10-CM | POA: Diagnosis not present

## 2024-02-07 DIAGNOSIS — Z008 Encounter for other general examination: Secondary | ICD-10-CM | POA: Diagnosis not present

## 2024-02-07 DIAGNOSIS — Z8249 Family history of ischemic heart disease and other diseases of the circulatory system: Secondary | ICD-10-CM | POA: Diagnosis not present

## 2024-02-07 DIAGNOSIS — E785 Hyperlipidemia, unspecified: Secondary | ICD-10-CM | POA: Diagnosis not present

## 2024-02-07 DIAGNOSIS — G621 Alcoholic polyneuropathy: Secondary | ICD-10-CM | POA: Diagnosis not present

## 2024-02-07 DIAGNOSIS — E46 Unspecified protein-calorie malnutrition: Secondary | ICD-10-CM | POA: Diagnosis not present

## 2024-02-07 DIAGNOSIS — J439 Emphysema, unspecified: Secondary | ICD-10-CM | POA: Diagnosis not present

## 2024-02-07 DIAGNOSIS — I700 Atherosclerosis of aorta: Secondary | ICD-10-CM | POA: Diagnosis not present

## 2024-02-07 DIAGNOSIS — M1990 Unspecified osteoarthritis, unspecified site: Secondary | ICD-10-CM | POA: Diagnosis not present

## 2024-02-07 DIAGNOSIS — F1020 Alcohol dependence, uncomplicated: Secondary | ICD-10-CM | POA: Diagnosis not present

## 2024-02-07 DIAGNOSIS — K709 Alcoholic liver disease, unspecified: Secondary | ICD-10-CM | POA: Diagnosis not present

## 2024-02-08 ENCOUNTER — Other Ambulatory Visit: Payer: Self-pay

## 2024-02-08 ENCOUNTER — Ambulatory Visit (HOSPITAL_COMMUNITY): Admission: RE | Admit: 2024-02-08 | Source: Ambulatory Visit

## 2024-02-08 ENCOUNTER — Encounter (HOSPITAL_COMMUNITY): Payer: Self-pay

## 2024-02-08 DIAGNOSIS — J449 Chronic obstructive pulmonary disease, unspecified: Secondary | ICD-10-CM | POA: Diagnosis not present

## 2024-02-08 DIAGNOSIS — I10 Essential (primary) hypertension: Secondary | ICD-10-CM | POA: Diagnosis not present

## 2024-02-08 DIAGNOSIS — R000 Tachycardia, unspecified: Secondary | ICD-10-CM | POA: Diagnosis not present

## 2024-02-22 ENCOUNTER — Inpatient Hospital Stay: Attending: Radiation Oncology

## 2024-02-22 DIAGNOSIS — Z923 Personal history of irradiation: Secondary | ICD-10-CM | POA: Insufficient documentation

## 2024-02-22 DIAGNOSIS — F17210 Nicotine dependence, cigarettes, uncomplicated: Secondary | ICD-10-CM | POA: Insufficient documentation

## 2024-02-22 DIAGNOSIS — Z800 Family history of malignant neoplasm of digestive organs: Secondary | ICD-10-CM | POA: Insufficient documentation

## 2024-02-22 DIAGNOSIS — G629 Polyneuropathy, unspecified: Secondary | ICD-10-CM | POA: Insufficient documentation

## 2024-02-22 DIAGNOSIS — Z85118 Personal history of other malignant neoplasm of bronchus and lung: Secondary | ICD-10-CM | POA: Insufficient documentation

## 2024-02-22 DIAGNOSIS — D509 Iron deficiency anemia, unspecified: Secondary | ICD-10-CM | POA: Insufficient documentation

## 2024-02-22 DIAGNOSIS — Z9221 Personal history of antineoplastic chemotherapy: Secondary | ICD-10-CM | POA: Insufficient documentation

## 2024-02-22 DIAGNOSIS — Z79899 Other long term (current) drug therapy: Secondary | ICD-10-CM | POA: Insufficient documentation

## 2024-02-22 DIAGNOSIS — Z8521 Personal history of malignant neoplasm of larynx: Secondary | ICD-10-CM | POA: Insufficient documentation

## 2024-02-27 ENCOUNTER — Inpatient Hospital Stay

## 2024-02-27 ENCOUNTER — Encounter: Payer: Self-pay | Admitting: Oncology

## 2024-02-27 VITALS — BP 166/73 | HR 120 | Temp 97.4°F | Resp 18 | Wt 145.7 lb

## 2024-02-27 DIAGNOSIS — Z800 Family history of malignant neoplasm of digestive organs: Secondary | ICD-10-CM | POA: Diagnosis not present

## 2024-02-27 DIAGNOSIS — Z79899 Other long term (current) drug therapy: Secondary | ICD-10-CM | POA: Diagnosis not present

## 2024-02-27 DIAGNOSIS — G629 Polyneuropathy, unspecified: Secondary | ICD-10-CM | POA: Diagnosis not present

## 2024-02-27 DIAGNOSIS — Z85118 Personal history of other malignant neoplasm of bronchus and lung: Secondary | ICD-10-CM | POA: Diagnosis not present

## 2024-02-27 DIAGNOSIS — D509 Iron deficiency anemia, unspecified: Secondary | ICD-10-CM | POA: Diagnosis not present

## 2024-02-27 DIAGNOSIS — Z923 Personal history of irradiation: Secondary | ICD-10-CM | POA: Diagnosis not present

## 2024-02-27 DIAGNOSIS — Z9221 Personal history of antineoplastic chemotherapy: Secondary | ICD-10-CM | POA: Diagnosis not present

## 2024-02-27 DIAGNOSIS — C321 Malignant neoplasm of supraglottis: Secondary | ICD-10-CM

## 2024-02-27 DIAGNOSIS — Z95828 Presence of other vascular implants and grafts: Secondary | ICD-10-CM

## 2024-02-27 DIAGNOSIS — F17210 Nicotine dependence, cigarettes, uncomplicated: Secondary | ICD-10-CM | POA: Diagnosis not present

## 2024-02-27 DIAGNOSIS — Z8521 Personal history of malignant neoplasm of larynx: Secondary | ICD-10-CM | POA: Diagnosis not present

## 2024-02-27 LAB — CBC WITH DIFFERENTIAL/PLATELET
Abs Immature Granulocytes: 0.04 K/uL (ref 0.00–0.07)
Basophils Absolute: 0 K/uL (ref 0.0–0.1)
Basophils Relative: 1 %
Eosinophils Absolute: 0 K/uL (ref 0.0–0.5)
Eosinophils Relative: 0 %
HCT: 40.7 % (ref 39.0–52.0)
Hemoglobin: 13.7 g/dL (ref 13.0–17.0)
Immature Granulocytes: 1 %
Lymphocytes Relative: 18 %
Lymphs Abs: 1.6 K/uL (ref 0.7–4.0)
MCH: 31.9 pg (ref 26.0–34.0)
MCHC: 33.7 g/dL (ref 30.0–36.0)
MCV: 94.9 fL (ref 80.0–100.0)
Monocytes Absolute: 0.9 K/uL (ref 0.1–1.0)
Monocytes Relative: 10 %
Neutro Abs: 6.3 K/uL (ref 1.7–7.7)
Neutrophils Relative %: 70 %
Platelets: 280 K/uL (ref 150–400)
RBC: 4.29 MIL/uL (ref 4.22–5.81)
RDW: 13.3 % (ref 11.5–15.5)
WBC: 8.9 K/uL (ref 4.0–10.5)
nRBC: 0 % (ref 0.0–0.2)

## 2024-02-27 LAB — COMPREHENSIVE METABOLIC PANEL WITH GFR
ALT: 14 U/L (ref 0–44)
AST: 23 U/L (ref 15–41)
Albumin: 3.7 g/dL (ref 3.5–5.0)
Alkaline Phosphatase: 95 U/L (ref 38–126)
Anion gap: 11 (ref 5–15)
BUN: 12 mg/dL (ref 8–23)
CO2: 26 mmol/L (ref 22–32)
Calcium: 9.2 mg/dL (ref 8.9–10.3)
Chloride: 99 mmol/L (ref 98–111)
Creatinine, Ser: 0.61 mg/dL (ref 0.61–1.24)
GFR, Estimated: >60 mL/min (ref >60)
Glucose, Bld: 139 mg/dL — ABNORMAL HIGH (ref 70–99)
Potassium: 3.9 mmol/L (ref 3.5–5.1)
Sodium: 136 mmol/L (ref 135–145)
Total Bilirubin: 0.3 mg/dL (ref 0.0–1.2)
Total Protein: 7.6 g/dL (ref 6.5–8.1)

## 2024-02-27 LAB — IRON AND TIBC
Iron: 55 ug/dL (ref 45–182)
Saturation Ratios: 14 % — ABNORMAL LOW (ref 17.9–39.5)
TIBC: 386 ug/dL (ref 250–450)
UIBC: 331 ug/dL

## 2024-02-27 LAB — FOLATE: Folate: 11.2 ng/mL (ref >5.9)

## 2024-02-27 LAB — FERRITIN: Ferritin: 42 ng/mL (ref 24–336)

## 2024-02-27 LAB — VITAMIN B12: Vitamin B-12: 480 pg/mL (ref 180–914)

## 2024-02-27 LAB — MAGNESIUM: Magnesium: 2.4 mg/dL (ref 1.7–2.4)

## 2024-02-27 LAB — TSH: TSH: 1.61 u[IU]/mL (ref 0.350–4.500)

## 2024-02-27 MED ORDER — HEPARIN SOD (PORK) LOCK FLUSH 100 UNIT/ML IV SOLN
500.0000 [IU] | Freq: Once | INTRAVENOUS | Status: AC
  Administered 2024-02-27: 500 [IU] via INTRAVENOUS

## 2024-02-27 MED ORDER — SODIUM CHLORIDE 0.9% FLUSH
10.0000 mL | INTRAVENOUS | Status: DC | PRN
  Administered 2024-02-27: 10 mL via INTRAVENOUS

## 2024-02-27 NOTE — Progress Notes (Signed)
 Patients port flushed without difficulty.  Good blood return noted with no bruising or swelling noted at site.  Band aid applied.  VSS with discharge and left in satisfactory condition with no s/s of distress noted.

## 2024-02-29 ENCOUNTER — Encounter: Payer: Self-pay | Admitting: Oncology

## 2024-02-29 ENCOUNTER — Inpatient Hospital Stay (HOSPITAL_BASED_OUTPATIENT_CLINIC_OR_DEPARTMENT_OTHER): Admitting: Hematology

## 2024-02-29 VITALS — BP 132/69 | HR 87 | Temp 97.7°F | Resp 18 | Wt 144.0 lb

## 2024-02-29 DIAGNOSIS — Z800 Family history of malignant neoplasm of digestive organs: Secondary | ICD-10-CM | POA: Diagnosis not present

## 2024-02-29 DIAGNOSIS — F17210 Nicotine dependence, cigarettes, uncomplicated: Secondary | ICD-10-CM | POA: Diagnosis not present

## 2024-02-29 DIAGNOSIS — Z923 Personal history of irradiation: Secondary | ICD-10-CM | POA: Diagnosis not present

## 2024-02-29 DIAGNOSIS — D508 Other iron deficiency anemias: Secondary | ICD-10-CM | POA: Diagnosis not present

## 2024-02-29 DIAGNOSIS — Z85118 Personal history of other malignant neoplasm of bronchus and lung: Secondary | ICD-10-CM | POA: Diagnosis not present

## 2024-02-29 DIAGNOSIS — C321 Malignant neoplasm of supraglottis: Secondary | ICD-10-CM

## 2024-02-29 DIAGNOSIS — G629 Polyneuropathy, unspecified: Secondary | ICD-10-CM | POA: Diagnosis not present

## 2024-02-29 DIAGNOSIS — Z8521 Personal history of malignant neoplasm of larynx: Secondary | ICD-10-CM | POA: Diagnosis not present

## 2024-02-29 DIAGNOSIS — C3492 Malignant neoplasm of unspecified part of left bronchus or lung: Secondary | ICD-10-CM

## 2024-02-29 DIAGNOSIS — Z79899 Other long term (current) drug therapy: Secondary | ICD-10-CM | POA: Diagnosis not present

## 2024-02-29 DIAGNOSIS — D509 Iron deficiency anemia, unspecified: Secondary | ICD-10-CM | POA: Diagnosis not present

## 2024-02-29 DIAGNOSIS — Z9221 Personal history of antineoplastic chemotherapy: Secondary | ICD-10-CM | POA: Diagnosis not present

## 2024-02-29 NOTE — Patient Instructions (Addendum)
 [facility] at [facility] Discharge Instructions   You were seen and examined today by Dr. Rogers.  He reviewed the results of your lab work which are normal/stable.   We will see you back in 6 months. We will repeat lab work and scans prior to this visit.    Return as scheduled.    Thank you for choosing [facility] at [facility] to provide your oncology and hematology care.  To afford each patient quality time with our provider, please arrive at least 15 minutes before your scheduled appointment time.   If you have a lab appointment with the Cancer Center please come in thru the Main Entrance and check in at the main information desk.  You need to re-schedule your appointment should you arrive 10 or more minutes late.  We strive to give you quality time with our providers, and arriving late affects you and other patients whose appointments are after yours.  Also, if you no show three or more times for appointments you may be dismissed from the clinic at the providers discretion.     Again, thank you for choosing [facility].  Our hope is that these requests will decrease the amount of time that you wait before being seen by our physicians.       _____________________________________________________________  Should you have questions after your visit to [facility], please contact our office at [phone] and follow the prompts.  Our office hours are 8:00 a.m. and 4:30 p.m. Monday - Friday.  Please note that voicemails left after 4:00 p.m. may not be returned until the following business day.  We are closed weekends and major holidays.  You do have access to a nurse 24-7, just call the main number to the clinic [phone] and do not press any options, hold on the line and a nurse will answer the phone.    For prescription refill requests, have your pharmacy contact our office and allow 72 hours.    Due to  Covid, you will need to wear a mask upon entering the hospital. If you do not have a mask, a mask will be given to you at the Main Entrance upon arrival. For doctor visits, patients may have 1 support person age 63 or older with them. For treatment visits, patients can not have anyone with them due to social distancing guidelines and our immunocompromised population.

## 2024-02-29 NOTE — Progress Notes (Signed)
 [facility] [address], [place] [address]    Clinic Day:  02/29/24   Referring physician: Carlette Benita Area*  Patient Care Team: Carlette Benita Area, MD as PCP - General (Internal Medicine) Delford Maude BROCKS, MD as PCP - Cardiology (Cardiology) Celestia Joesph SQUIBB, RN as Oncology Nurse Navigator (Oncology) Rogers Hai, MD as Medical Oncologist (Medical Oncology) Malmfelt, Delon CROME, RN as Oncology Nurse Navigator Izell Domino, MD as Consulting Physician (Radiation Oncology) Autumn Millman, MD as Consulting Physician (Oncology) Llewellyn Gerard LABOR, DO as Consulting Physician (Otolaryngology) Izell Domino, MD as Attending Physician (Radiation Oncology)   ASSESSMENT & PLAN:   Assessment:  1.  Supraglottic squamous cell carcinoma: - Incidental supraglottic mass found during endoscopy by Dr. Wilhelmenia - ENT Dr. Llewellyn examination findings: Exophytic mass lesion involving right false vocal cord/arytenoids/aryepiglottic fold, partially obscures view of the right true vocal cord with preservation of vocal cord mobility.  Hypopharynx normal without mass. - PET scan (07/13/2023): Hypermetabolic right-sided supraglottic mass, hypermetabolic right level 3 lymph node.  No other metastatic disease. - Chemoradiation therapy with weekly cisplatin  from 07/31/2023 through 09/19/2023 under the direction of Dr. Felicity.  2.  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 Shellia for left upper lobe lung mass. - Is current active smoker, 1/2 pack/day for 35 years. - CT lung cancer screening scan on 10/16/2020 showed cavitary mass in the anterior left upper lobe measuring 4.4 x 3.0 x 3.5 cm with 2.5 cm solid nodular component inferomedially.  This abuts and involves the anterior pleura/chest wall.  Expansile lytic lesion in the right posterolateral ninth and 10th ribs (unchanged dating back to CT from 06/16/2016).  10  mm short axis AP window node suspicious for nodal metastasis. - No change in baseline cough.  No hemoptysis.  No chest pain. - Brain MRI was negative for metastatic disease. - PET scan on 12/14/2020 showed left upper lobe mass measuring 3.9 x 4 cm with SUV 17.1.  Some contiguous hypermetabolism along the left chest wall including anterolateral left third rib with SUV 5.4.  No gross osseous destruction.  AP window lymph node of 9 mm, mildly hypermetabolic, SUV 2.8. - On 12/23/2020 left upper lobe brushing and lavage consistent with squamous cell carcinoma. - Left upper lobe fine-needle aspiration was also consistent with squamous cell carcinoma.  Lymph node could not be reached. - Caris testing with limited tissue showed MMR proficient, BRAF V6 energy negative, PD-L1 negative by 22 C3, 28-8, SP142.  (IHC/CISH results should be interpreted with caution given the potential for false negative results) - 3 cycles of carboplatin , paclitaxel  and nivolumab  from 01/13/2021 through 02/24/2021 based on [id number]. - Left upper lobectomy and lymph node resection on 04/21/2021. - Pathology consistent with invasive moderately differentiated squamous cell carcinoma, 5 cm, margins negative.  Visceral pleura not involved.  Metastatic squamous cell carcinoma in 1/2 hilar lymph nodes.  Levels 5, 11, 12, 10, 11 and 12 lymph nodes were negative.  No definitive tumor response identified.  No lymphovascular invasion.  ypT2b, ypN1. - Caris testing on resection sample from 04/21/2021 shows PD-L1 TPS 1%.  No targetable mutations.  MSI stable.  TMB low.   2.  Social/family history: - Previously worked in Designer, fashion/clothing, currently on disability. - Current active smoker, 1/2 pack/day for 35 years.  Now smokes about a pack a day. - Drinks 6 pack of beers along with 1 quart of liquor every day. - 1  brother has recurrent throat cancer and another brother died of throat cancer metastasized to the brain.  Plan:  1.  Stage III (T3 N1 M0)  supraglottic squamous cell carcinoma: - PET scan (12/13/2020): Previous uptake along the supraglottic region on the right and level 3 lymph nodes improved.  No new areas of abnormal uptake. - We reviewed labs from 02/27/2024: TSH is 1.6.  CBC was normal.  LFTs are normal. - I will make an appointment for him to see Dr. Llewellyn for fiberoptic exam. - RTC 6 months for follow-up with CT soft tissue neck with contrast.  2.  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  I have counseled him to quit smoking. - PET scan on 12/14/2023: No evidence of recurrence of his lung cancer. - Will obtain CT chest in 6 months.  3.  Nutrition: - He is taking in 4 cans of Osmolite via PEG tube.  He drinks 2 ensures per day. - He is able to eat soft foods but he cannot taste them well. - I will make a referral to our dietitian.  Plan is to wean him off of tube feeds.  If he is able to maintain his weight, we will discontinue PEG tube.   4.  Neuropathy: - Numbness in hands and feet is stable without any neuropathic pains.    Orders Placed This Encounter  Procedures   CT SOFT TISSUE NECK W CONTRAST    Standing Status:   Future    Expected Date:   08/31/2024    Expiration Date:   02/28/2025    If indicated for the ordered procedure, I authorize the administration of contrast media per Radiology protocol:   Yes    Does the patient have a contrast media/X-ray dye allergy?:   No    Preferred imaging location?:   [facility]   CT CHEST W CONTRAST    Standing Status:   Future    Expected Date:   08/31/2024    Expiration Date:   02/28/2025    If indicated for the ordered procedure, I authorize the administration of contrast media per Radiology protocol:   Yes    Does the patient have a contrast media/X-ray dye allergy?:   No    Preferred imaging location?:   [facility]   TSH    Standing Status:   Future    Expected Date:    08/26/2024    Expiration Date:   11/24/2024   CBC with Differential    Standing Status:   Future    Expected Date:   08/26/2024    Expiration Date:   11/24/2024   Comprehensive metabolic panel    Standing Status:   Future    Expected Date:   08/26/2024    Expiration Date:   11/24/2024   Iron and TIBC ([facility] ONLY)    Standing Status:   Future    Expected Date:   08/26/2024    Expiration Date:   11/24/2024   Ferritin    Standing Status:   Future    Expected Date:   08/26/2024    Expiration Date:   11/24/2024     LILLETTE Hummingbird R Teague,acting as a scribe for Alean Stands, MD.,have documented all relevant documentation on the behalf of Alean Stands, MD,as directed by  Alean Stands, MD while in the presence of Alean Stands, MD.  I, Alean Stands MD, have  reviewed the above documentation for accuracy and completeness, and I agree with the above.      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 [facility]) Staging form: Larynx - Supraglottis, AJCC 8th Edition - Clinical: Stage III (cT3, cN1, cM0) - Signed by Autumn Millman, MD on 09/08/2023    Prior Therapy: carboplatin , paclitaxel  and nivolumab  q21d x3 cycles   Current Therapy:  surveillance    HISTORY OF PRESENT ILLNESS:   Oncology History  Primary lung squamous cell carcinoma, left (HCC)  12/31/2020 Cancer Staging   Staging form:  Lung, AJCC 8th Edition - Clinical stage from 12/31/2020: Stage IIIA (cT2b, cN2, cM0) - Signed by Stands Alean, MD on 01/01/2021 Stage prefix: Initial diagnosis   01/01/2021 Initial Diagnosis   Primary lung squamous cell carcinoma, left (HCC)   01/13/2021 - 02/24/2021 Chemotherapy   Patient is on Treatment Plan : LUNG NSCLC Carboplatin  / Paclitaxel  q21d x 3 cycles     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)  07/19/2023 Initial Diagnosis   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)   07/19/2023 Cancer Staging   Staging form: Larynx - Supraglottis, AJCC 8th Edition - Clinical: Stage III (cT3, cN1, cM0) - Signed by Autumn Millman, MD on 09/08/2023   Cancer of soft palate (HCC)  07/19/2023 Initial Diagnosis   Cancer of soft palate (HCC)   07/19/2023 Cancer Staging   Staging form: Pharynx - P16 Negative Oropharynx, AJCC 8th Edition - Clinical stage from 07/19/2023: Stage III (cT3, cN1, cM0, p16-) - Signed by Izell Domino, MD on 07/19/2023 Stage prefix: Initial diagnosis   07/31/2023 -  Chemotherapy   Patient is on Treatment Plan : HEAD/NECK Cisplatin  (40) q7d     Malignant neoplasm of supraglottis (HCC) (Resolved)  07/19/2023 Initial Diagnosis   Malignant neoplasm of supraglottis (HCC)      INTERVAL HISTORY:   Xylon is a 63 y.o. male presenting to clinic today for follow up of bronchogenic carcnioma in left upper lobe of lung. He was last seen by me on 11/20/2023.  Today, he states that he is doing well overall. His appetite level is at 50%. His energy level is at 50%. Laird has gained 14 pounds since his last visit and notes a fair appetite. He has decreased taste and is unable to taste sweet foods. His feeding tube has been replaced due to leakage and is currently using it with 4 Osmolite's a day. He is also drinking 2 Ensures a day, as well as water  and occasionally soda. Offie denies any dysphagia and is able to eat most foods  without restriction. He is an active smoker of 1 ppd.  Neuropathy is stable in the form of numbness in the hands and feet.   He does not have follow-up with Dr. Izell or ENT.  PAST MEDICAL HISTORY:   Past Medical History: Past Medical History:  Diagnosis Date   Cancer (HCC)    Lung  cancer   COPD (chronic obstructive pulmonary disease) (HCC) 2007   EMPHYSEMA   Depression    Dyspnea    occasional - no oxygen   GERD (gastroesophageal reflux disease) 2010   Hepatitis    Alcoholic    HLD (hyperlipidemia)    no meds   Hypertension    Port-A-Cath in place 01/01/2021    Surgical History: Past Surgical History:  Procedure Laterality Date   BIOPSY  05/15/2023   Procedure: BIOPSY;  Surgeon: Wilhelmenia Aloha Raddle., MD;  Location: [facility];  Service: Gastroenterology;;   BRONCHIAL BIOPSY  12/23/2020   Procedure: BRONCHIAL BIOPSIES;  Surgeon: Shelah Lamar RAMAN, MD;  Location: [facility] ENDOSCOPY;  Service: Pulmonary;;   BRONCHIAL BRUSHINGS  12/23/2020   Procedure: BRONCHIAL BRUSHINGS;  Surgeon: Shelah Lamar RAMAN, MD;  Location: [facility];  Service: Pulmonary;;   BRONCHIAL NEEDLE ASPIRATION BIOPSY  12/23/2020   Procedure: BRONCHIAL NEEDLE ASPIRATION BIOPSIES;  Surgeon: Shelah Lamar RAMAN, MD;  Location: [facility] ENDOSCOPY;  Service: Pulmonary;;   BRONCHIAL WASHINGS  12/23/2020   Procedure: BRONCHIAL WASHINGS;  Surgeon: Shelah Lamar RAMAN, MD;  Location[facility]COPY;  Service: Pulmonary;;   CARDIAC CATHETERIZATION  2010   COLONOSCOPY  03/14/2012   Procedure: COLONOSCOPY;  Surgeon: Claudis RAYMOND Rivet, MD;  Location[facility];  Service: Endoscopy;  Laterality: N/A;  100   ESOPHAGOGASTRODUODENOSCOPY (EGD) WITH PROPOFOL  N/A 05/15/2023   Procedure: ESOPHAGOGASTRODUODENOSCOPY (EGD) WITH PROPOFOL ;  Surgeon: Wilhelmenia Aloha Raddle., MD;  Location: [facility] ENDOSCOPY;  Service: Gastroenterology;  Laterality: N/A;   EUS N/A 05/15/2023   Procedure: UPPER ENDOSCOPIC ULTRASOUND (EUS) RADIAL;  Surgeon: Wilhelmenia Aloha Raddle., MD;  Location: [facility]COPY;  Service: Gastroenterology;  Laterality: N/A;   INTERCOSTAL NERVE BLOCK  04/21/2021   Procedure: INTERCOSTAL NERVE BLOCK;  Surgeon: Kerrin Elspeth BROCKS, MD;  [facility];  Service: Thoracic;;   IR GASTROSTOMY TUBE MOD SED  07/27/2023   IR IMAGING GUIDED PORT INSERTION  01/12/2021   IR REPLACE G-TUBE SIMPLE WO FLUORO  10/24/2023   IR REPLC GASTRO/COLONIC TUBE PERCUT W/FLUORO  10/19/2023   LARYNGOSCOPY N/A 07/04/2023   Procedure: LARYNGOSCOPY WITH BIOPSIES;  Surgeon: Llewellyn Gerard LABOR, DO;  Location: [facility];  Service: ENT;  Laterality: N/A;   LUNG REMOVAL, PARTIAL  08/2021   PENILE PROSTHESIS IMPLANT N/A 02/17/2022   Procedure: PENILE PROTHESIS INFLATABLE;  Surgeon: Sherrilee Belvie CROME, MD;  Location: [facility];  Service: Urology;  Laterality: N/A;   PORTACATH PLACEMENT  07/2021   REMOVAL OF PENILE PROSTHESIS N/A 10/13/2022   Procedure: REMOVAL OF PENILE PROSTHESIS;  Surgeon: Sherrilee Belvie CROME, MD;  Location: [facility];  Service: Urology;  Laterality: N/A;   REMOVAL OF PENILE PROSTHESIS N/A 12/05/2022   Procedure: REMOVAL AND REPLACEMENT OF PENILE PROSTHESIS;  Surgeon: Lovie Arlyss CROME, MD;  Location: [facility];  Service: Urology;  Laterality: N/A;  120 MINUTES NEEDED FOR CASE   VIDEO ASSISTED THORACOSCOPY (VATS)/ LOBECTOMY Left 04/21/2021   Procedure: VIDEO ASSISTED THORACOSCOPY (VATS)/ LOBECTOMY;  Surgeon: Kerrin Elspeth BROCKS, MD;  [facility];  Service: Thoracic;  Laterality: Left;   VIDEO BRONCHOSCOPY WITH ENDOBRONCHIAL NAVIGATION N/A 12/23/2020   Procedure: VIDEO BRONCHOSCOPY WITH ENDOBRONCHIAL NAVIGATION;  Surgeon: Shelah Lamar RAMAN, MD;  Location: [facility] ENDOSCOPY;  Service: Pulmonary;  Laterality: N/A;   WRIST GANGLION EXCISION  1985    Social History: Social History   Socioeconomic History   Marital status: Married    Spouse name: Not on file   Number of children: Not on file  Years of education: Not on file   Highest  education level: Not on file  Occupational History   Not on file  Tobacco Use   Smoking status: Every Day    Current packs/day: 1.00    Average packs/day: 1 pack/day for 37.0 years (37.0 ttl pk-yrs)    Types: Cigarettes   Smokeless tobacco: Never   Tobacco comments:    smokes 1/2 pack a day [facility] 03/08/21  Vaping Use   Vaping status: Never Used  Substance and Sexual Activity   Alcohol  use: Not Currently    Alcohol /week: 56.0 standard drinks of alcohol     Types: 35 Cans of beer, 21 Shots of liquor per week    Comment: 5-6 beers and 2-3 shots daily-04/19/21   Drug use: No   Sexual activity: Yes  Other Topics Concern   Not on file  Social History Narrative   Not on file   Social Drivers of Health   Financial Resource Strain: Not on file  Food Insecurity: Food Insecurity Present (07/19/2023)   Hunger Vital Sign    Worried About Running Out of Food in the Last Year: Sometimes true    Ran Out of Food in the Last Year: Sometimes true  Transportation Needs: No Transportation Needs (07/19/2023)   PRAPARE - Administrator, Civil Service (Medical): No    Lack of Transportation (Non-Medical): No  Physical Activity: Inactive (12/03/2020)   Exercise Vital Sign    Days of Exercise per Week: 0 days    Minutes of Exercise per Session: 0 min  Stress: Not on file  Social Connections: Not on file  Intimate Partner Violence: Not At Risk (07/19/2023)   Humiliation, Afraid, Rape, and Kick questionnaire    Fear of Current or Ex-Partner: No    Emotionally Abused: No    Physically Abused: No    Sexually Abused: No    Family History: Family History  Problem Relation Age of Onset   Hypertension Mother    Asthma Father    Diabetes Sister    Hypertension Sister    Hypertension Brother    Hypertension Sister    Lupus Daughter     Current Medications:  Current Outpatient Medications:    acetaminophen  (TYLENOL ) 500 MG tablet, Take 2 tablets (1,000 mg total) by mouth every 6 (six)  hours., Disp: 45 tablet, Rfl: 0   albuterol  (VENTOLIN  HFA) 108 (90 Base) MCG/ACT inhaler, Inhale 1-2 puffs into the lungs every 6 (six) hours as needed for wheezing or shortness of breath., Disp: , Rfl:    ALPRAZolam  (XANAX ) 0.5 MG tablet, Take 1 tablet (0.5 mg total) by mouth at bedtime as needed for anxiety., Disp: 30 tablet, Rfl: 0   amLODipine  (NORVASC ) 2.5 MG tablet, Take 2.5 mg by mouth every morning., Disp: , Rfl:    amoxicillin -clavulanate (AUGMENTIN ) 875-125 MG tablet, Take 1 tablet by mouth every 12 (twelve) hours., Disp: 14 tablet, Rfl: 0   fluconazole  (DIFLUCAN ) 100 MG tablet, TAKE 2 TABLETS BY MOUTH TODAY, THEN 1 TABLET DAILY FOR 20 MORE DAYS, Disp: 22 tablet, Rfl: 0   Fluticasone -Umeclidin-Vilant (TRELEGY ELLIPTA) 100-62.5-25 MCG/INH AEPB, Inhale 1 puff into the lungs in the morning., Disp: , Rfl:    lidocaine  (XYLOCAINE ) 2 % solution, Patient: Mix 1part 2% viscous lidocaine , 1part H20. Swish & swallow 10mL of diluted mixture, before meals and at bedtime, up to QID, Disp: 480 mL, Rfl: 3   lidocaine -prilocaine  (EMLA ) cream, Apply to affected area once, Disp: 30 g, Rfl: 3  loratadine (CLARITIN) 10 MG tablet, Take 10 mg by mouth daily., Disp: , Rfl:    magnesium  oxide (MAG-OX) 400 (240 Mg) MG tablet, Take 1 tablet (400 mg total) by mouth daily., Disp: 30 tablet, Rfl: 0   metoprolol  tartrate (LOPRESSOR ) 100 MG tablet, Take 100 mg by mouth 2 (two) times daily., Disp: , Rfl:    mirtazapine (REMERON) 15 MG tablet, Take 15 mg by mouth at bedtime., Disp: , Rfl:    Multiple Vitamin (MULTIVITAMIN WITH MINERALS) TABS tablet, Take 1 tablet by mouth in the morning., Disp: , Rfl:    Nutritional Supplements (FEEDING SUPPLEMENT, OSMOLITE 1.5 CAL,) LIQD, Give 2 cartons at 8am and noon via feeding tube.  Give 1 carton at 4pm and 8pm.  Flush with 60ml of water  before and after each feeding.  Give additional 3 cups of water  via tube for hydration if unable to drink orally., Disp: , Rfl:    ondansetron   (ZOFRAN ) 8 MG tablet, Take 1 tablet (8 mg total) by mouth every 8 (eight) hours as needed for nausea or vomiting. Start on the third day after cisplatin ., Disp: 30 tablet, Rfl: 1   Oxycodone  HCl 20 MG TABS, Take 1 tablet (20 mg total) by mouth every 8 (eight) hours as needed., Disp: 90 tablet, Rfl: 0   pantoprazole  (PROTONIX ) 20 MG tablet, Take 1 tablet (20 mg total) by mouth 2 (two) times daily for 14 days., Disp: 28 tablet, Rfl: 0   potassium chloride  SA (KLOR-CON  M) 20 MEQ tablet, Take 1 tablet (20 mEq total) by mouth 2 (two) times daily., Disp: 60 tablet, Rfl: 1   prochlorperazine  (COMPAZINE ) 10 MG tablet, Take 1 tablet (10 mg total) by mouth every 6 (six) hours as needed (Nausea or vomiting)., Disp: 30 tablet, Rfl: 1   sodium fluoride  (FLUORISHIELD) 1.1 % GEL dental gel, Place 1 Application onto teeth at bedtime., Disp: , Rfl:    sucralfate  (CARAFATE ) 1 g tablet, Take 1 tablet (1 g total) by mouth with breakfast, with lunch, and with evening meal for 7 days., Disp: 21 tablet, Rfl: 0   Zinc  Oxide (TRIPLE PASTE) 12.8 % ointment, Apply 1 Application topically as needed for irritation., Disp: 56.7 g, Rfl: 0   Allergies: No Known Allergies   REVIEW OF SYSTEMS:   Review of Systems  Constitutional:  Negative for chills, fatigue and fever.  HENT:   Positive for sore throat (8/10 pain severity). Negative for lump/mass, mouth sores, nosebleeds and trouble swallowing.   Eyes:  Negative for eye problems.  Respiratory:  Positive for cough and shortness of breath.   Cardiovascular:  Negative for chest pain, leg swelling and palpitations.  Gastrointestinal:  Positive for constipation. Negative for abdominal pain, diarrhea, nausea and vomiting.  Genitourinary:  Negative for bladder incontinence, difficulty urinating, dysuria, frequency, hematuria and nocturia.   Musculoskeletal:  Negative for arthralgias, back pain, flank pain, myalgias and neck pain.  Skin:  Negative for itching and rash.   Neurological:  Positive for numbness (in the hands and feet). Negative for dizziness and headaches.  Hematological:  Does not bruise/bleed easily.  Psychiatric/Behavioral:  Negative for depression, sleep disturbance and suicidal ideas. The patient is not nervous/anxious.   All other systems reviewed and are negative.    VITALS:   Blood pressure 132/69, pulse 87, temperature 97.7 F (36.5 C), temperature source Tympanic, resp. rate 18, weight 143 lb 15.4 oz (65.3 kg), SpO2 100%.  Wt Readings from Last 3 Encounters:  02/29/24 143 lb 15.4 oz (65.3 kg)  02/27/24 145 lb 11.6 oz (66.1 kg)  11/20/23 129 lb 3.2 oz (58.6 kg)    Body mass index is 21.26 kg/m.  Performance status (ECOG): 1 - Symptomatic but completely ambulatory  PHYSICAL EXAM:   Physical Exam Vitals and nursing note reviewed. Exam conducted with a chaperone present.  Constitutional:      Appearance: Normal appearance.  Cardiovascular:     Rate and Rhythm: Normal rate and regular rhythm.     Pulses: Normal pulses.     Heart sounds: Normal heart sounds.  Pulmonary:     Effort: Pulmonary effort is normal.     Breath sounds: Normal breath sounds.  Abdominal:     Palpations: Abdomen is soft. There is no hepatomegaly, splenomegaly or mass.     Tenderness: There is no abdominal tenderness.  Musculoskeletal:     Right lower leg: No edema.     Left lower leg: No edema.  Lymphadenopathy:     Cervical: No cervical adenopathy.     Right cervical: No superficial, deep or posterior cervical adenopathy.    Left cervical: No superficial, deep or posterior cervical adenopathy.     Upper Body:     Right upper body: No supraclavicular or axillary adenopathy.     Left upper body: No supraclavicular or axillary adenopathy.  Neurological:     General: No focal deficit present.     Mental Status: He is alert and oriented to person, place, and time.  Psychiatric:        Mood and Affect: Mood normal.        Behavior: Behavior  normal.     LABS:      Latest Ref Rng & Units 02/27/2024   12:53 PM 10/25/2023    2:28 PM 09/21/2023    3:56 PM  CBC  WBC 4.0 - 10.5 K/uL 8.9  9.3  7.0   Hemoglobin 13.0 - 17.0 g/dL 86.2  89.6  89.3   Hematocrit 39.0 - 52.0 % 40.7  31.5  31.1   Platelets 150 - 400 K/uL 280  250  198       Latest Ref Rng & Units 02/27/2024   12:53 PM 10/25/2023    2:28 PM 09/21/2023    3:56 PM  CMP  Glucose 70 - 99 mg/dL 860  884  871   BUN 8 - 23 mg/dL 12  20  11    Creatinine 0.61 - 1.24 mg/dL 9.38  9.26  9.33   Sodium 135 - 145 mmol/L 136  135  134   Potassium 3.5 - 5.1 mmol/L 3.9  3.4  3.9   Chloride 98 - 111 mmol/L 99  93  97   CO2 22 - 32 mmol/L 26  25  26    Calcium 8.9 - 10.3 mg/dL 9.2  9.6  9.2   Total Protein 6.5 - 8.1 g/dL 7.6  7.5  7.4   Total Bilirubin 0.0 - 1.2 mg/dL 0.3  0.2  0.4   Alkaline Phos 38 - 126 U/L 95  73  62   AST 15 - 41 U/L 23  20  17    ALT 0 - 44 U/L 14  11  10       No results found for: CEA1, CEA / No results found for: CEA1, CEA No results found for: PSA1 No results found for: CAN199 No results found for: CAN125  No results found for: TOTALPROTELP, ALBUMINELP, A1GS, A2GS, BETS, BETA2SER, GAMS, MSPIKE, SPEI Lab Results  Component Value Date  TIBC 386 02/27/2024   TIBC 221 (L) 09/21/2023   FERRITIN 42 02/27/2024   FERRITIN 319 09/21/2023   IRONPCTSAT 14 (L) 02/27/2024   IRONPCTSAT 24 09/21/2023   No results found for: LDH   STUDIES:   No results found.

## 2024-03-01 ENCOUNTER — Other Ambulatory Visit: Payer: Self-pay

## 2024-03-06 ENCOUNTER — Other Ambulatory Visit: Payer: Self-pay | Admitting: *Deleted

## 2024-03-06 DIAGNOSIS — G893 Neoplasm related pain (acute) (chronic): Secondary | ICD-10-CM

## 2024-03-06 MED ORDER — OXYCODONE HCL 20 MG PO TABS: 20.0000 mg | ORAL_TABLET | Freq: Three times a day (TID) | ORAL | 0 refills | Status: DC | PRN

## 2024-03-18 ENCOUNTER — Inpatient Hospital Stay: Admitting: Dietician

## 2024-04-13 ENCOUNTER — Encounter: Payer: Self-pay | Admitting: Oncology

## 2024-04-14 ENCOUNTER — Other Ambulatory Visit: Payer: Self-pay | Admitting: *Deleted

## 2024-04-14 DIAGNOSIS — G893 Neoplasm related pain (acute) (chronic): Secondary | ICD-10-CM

## 2024-04-15 ENCOUNTER — Encounter: Payer: Self-pay | Admitting: Oncology

## 2024-04-15 MED ORDER — OXYCODONE HCL 20 MG PO TABS: 20.0000 mg | ORAL_TABLET | Freq: Three times a day (TID) | ORAL | 0 refills | Status: DC | PRN

## 2024-04-17 NOTE — Progress Notes (Unsigned)
 CARDIOLOGY CONSULT NOTE       Patient ID: Bryan Fleming MRN: [id number] DOB/AGE: 1960/11/24 63 y.o.  Referring Physician: Carlette Primary Physician: [name], [name], MD Primary Cardiologist: Delford Reason for Consultation: Cardiomegaly   HPI:  64 y.o. referred by Dr Carlette for cardiomegaly First seen on 03/15/21 History of HTN, HLD, Depression COPD and lung cancer diagnosed May 2022 stage 3A LUL Still smoking a ppd  Mets to ribs Had chemo with 3 cycles of carboplatin , paclitaxel  and nivolumab  with LULobectomy 04/21/21   Smokes 1/2 ppd  Drinks 6 pack beer and quart of liquor / day He takes neurontin  for neuropathy in fingertips / toes He takes oxycodone  for joint pain ? 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  07/31/23-09/19/23  Stage 3 PET/CT no recurrent lung cancer He can take soft food but has PEG tube for supplemental nutrition   No cardiac issues   ROS All other systems reviewed and negative except as noted above  Past Medical History:  Diagnosis Date   Cancer (HCC)    Lung cancer   COPD (chronic obstructive pulmonary disease) (HCC) 2007   EMPHYSEMA   Depression    Dyspnea    occasional - no oxygen   GERD (gastroesophageal reflux disease) 2010   Hepatitis    Alcoholic    HLD (hyperlipidemia)    no meds   Hypertension    Port-A-Cath in place 01/01/2021    Family History  Problem Relation Age of Onset   Hypertension Mother    Asthma Father    Diabetes Sister    Hypertension Sister    Hypertension  Brother    Hypertension Sister    Lupus Daughter     Social History   Socioeconomic History   Marital status: Married    Spouse name: Not on file   Number of children: Not on file   Years of education: Not on file   Highest education level: Not on file  Occupational History   Not on file  Tobacco Use   Smoking status: Every Day    Current packs/day: 1.00    Average packs/day: 1 pack/day for 37.0 years (37.0 ttl pk-yrs)    Types: Cigarettes   Smokeless tobacco: Never   Tobacco comments:    smokes 1/2 pack a day [facility] 03/08/21  Vaping Use   Vaping status: Never Used  Substance and Sexual Activity   Alcohol  use: Not Currently    Alcohol /week: 56.0 standard drinks of alcohol     Types: 35 Cans of beer, 21 Shots of liquor per week    Comment: 5-6 beers and 2-3 shots daily-04/19/21   Drug use: No   Sexual activity: Yes  Other Topics Concern   Not on file  Social History Narrative   Not on file   Social Drivers of Health   Financial Resource Strain: Not on file  Food Insecurity: Food Insecurity Present (07/19/2023)   Hunger Vital Sign    Worried About Running Out of Food in the Last Year: Sometimes true  Ran Out of Food in the Last Year: Sometimes true  Transportation Needs: No Transportation Needs (07/19/2023)   PRAPARE - Administrator, Civil Service (Medical): No    Lack of Transportation (Non-Medical): No  Physical Activity: Inactive (12/03/2020)   Exercise Vital Sign    Days of Exercise per Week: 0 days    Minutes of Exercise per Session: 0 min  Stress: Not on file  Social Connections: Not on file  Intimate Partner Violence: Not At Risk (07/19/2023)   Humiliation, Afraid, Rape, and Kick questionnaire    Fear of Current or Ex-Partner: No    Emotionally Abused: No    Physically Abused: No    Sexually Abused: No    Past Surgical History:  Procedure Laterality Date   BIOPSY  05/15/2023   Procedure: BIOPSY;  Surgeon: Wilhelmenia, Aloha Raddle., MD;   Location: [facility];  Service: Gastroenterology;;   BRONCHIAL BIOPSY  12/23/2020   Procedure: BRONCHIAL BIOPSIES;  Surgeon: Shelah Lamar RAMAN, MD;  Location: [facility] ENDOSCOPY;  Service: Pulmonary;;   BRONCHIAL BRUSHINGS  12/23/2020   Procedure: BRONCHIAL BRUSHINGS;  Surgeon: Shelah Lamar RAMAN, MD;  Location: [facility] ENDOSCOPY;  Service: Pulmonary;;   BRONCHIAL NEEDLE ASPIRATION BIOPSY  12/23/2020   Procedure: BRONCHIAL NEEDLE ASPIRATION BIOPSIES;  Surgeon: Shelah Lamar RAMAN, MD;  Location: [facility] ENDOSCOPY;  Service: Pulmonary;;   BRONCHIAL WASHINGS  12/23/2020   Procedure: BRONCHIAL WASHINGS;  Surgeon: Shelah Lamar RAMAN, MD;  Location: [facility] ENDOSCOPY;  Service: Pulmonary;;   CARDIAC CATHETERIZATION  2010   COLONOSCOPY  03/14/2012   Procedure: COLONOSCOPY;  Surgeon: Claudis RAYMOND Rivet, MD;  [facility];  Service: Endoscopy;  Laterality: N/A;  100   ESOPHAGOGASTRODUODENOSCOPY (EGD) WITH PROPOFOL  N/A 05/15/2023   Procedure: ESOPHAGOGASTRODUODENOSCOPY (EGD) WITH PROPOFOL ;  Surgeon: Wilhelmenia Aloha Raddle., MD;  Location: [facility];  Service: Gastroenterology;  Laterality: N/A;   EUS N/A 05/15/2023   Procedure: UPPER ENDOSCOPIC ULTRASOUND (EUS) RADIAL;  Surgeon: Wilhelmenia Aloha Raddle., MD;  Location: [facility];  Service: Gastroenterology;  Laterality: N/A;   INTERCOSTAL NERVE BLOCK  04/21/2021   Procedure: INTERCOSTAL NERVE BLOCK;  Surgeon: Kerrin Elspeth BROCKS, MD;  Location: [facility] OR;  Service: Thoracic;;   IR GASTROSTOMY TUBE MOD SED  07/27/2023   IR IMAGING GUIDED PORT INSERTION  01/12/2021   IR REPLACE G-TUBE SIMPLE WO FLUORO  10/24/2023   IR REPLC GASTRO/COLONIC TUBE PERCUT W/FLUORO  10/19/2023   LARYNGOSCOPY N/A 07/04/2023   Procedure: LARYNGOSCOPY WITH BIOPSIES;  Surgeon: Llewellyn Gerard LABOR, DO;  Location: [facility];  Service: ENT;  Laterality: N/A;   LUNG REMOVAL, PARTIAL  08/2021   PENILE PROSTHESIS IMPLANT N/A 02/17/2022   Procedure: PENILE PROTHESIS INFLATABLE;  Surgeon:  Sherrilee Belvie CROME, MD;  Location: [facility];  Service: Urology;  Laterality: N/A;   PORTACATH PLACEMENT  07/2021   REMOVAL OF PENILE PROSTHESIS N/A 10/13/2022   Procedure: REMOVAL OF PENILE PROSTHESIS;  Surgeon: Sherrilee Belvie CROME, MD;  Location: [facility];  Service: Urology;  Laterality: N/A;   REMOVAL OF PENILE PROSTHESIS N/A 12/05/2022   Procedure: REMOVAL AND REPLACEMENT OF PENILE PROSTHESIS;  Surgeon: Lovie Arlyss CROME, MD;  Location: [facility];  Service: Urology;  Laterality: N/A;  120 MINUTES NEEDED FOR CASE   VIDEO ASSISTED THORACOSCOPY (VATS)/ LOBECTOMY Left 04/21/2021   Procedure: VIDEO ASSISTED THORACOSCOPY (VATS)/ LOBECTOMY;  Surgeon: Kerrin Elspeth BROCKS, MD;  Location[facility] OR;  Service: Thoracic;  Laterality: Left;   VIDEO BRONCHOSCOPY WITH ENDOBRONCHIAL NAVIGATION N/A 12/23/2020   Procedure:  VIDEO BRONCHOSCOPY WITH ENDOBRONCHIAL NAVIGATION;  Surgeon: Shelah Lamar RAMAN, MD;  Location: [facility] ENDOSCOPY;  Service: Pulmonary;  Laterality: N/A;   WRIST GANGLION EXCISION  1985      Current Outpatient Medications:    acetaminophen  (TYLENOL ) 500 MG tablet, Take 2 tablets (1,000 mg total) by mouth every 6 (six) hours., Disp: 45 tablet, Rfl: 0   albuterol  (VENTOLIN  HFA) 108 (90 Base) MCG/ACT inhaler, Inhale 1-2 puffs into the lungs every 6 (six) hours as needed for wheezing or shortness of breath., Disp: , Rfl:    ALPRAZolam  (XANAX ) 0.5 MG tablet, Take 1 tablet (0.5 mg total) by mouth at bedtime as needed for anxiety., Disp: 30 tablet, Rfl: 0   amLODipine  (NORVASC ) 2.5 MG tablet, Take 2.5 mg by mouth every morning., Disp: , Rfl:    Fluticasone -Umeclidin-Vilant (TRELEGY ELLIPTA) 100-62.5-25 MCG/INH AEPB, Inhale 1 puff into the lungs in the morning., Disp: , Rfl:    lidocaine  (XYLOCAINE ) 2 % solution, Patient: Mix 1part 2% viscous lidocaine , 1part H20. Swish & swallow 10mL of diluted mixture, before meals and at bedtime, up to QID, Disp: 480 mL, Rfl: 3   loratadine (CLARITIN) 10 MG tablet, Take 10  mg by mouth daily., Disp: , Rfl:    metoprolol  tartrate (LOPRESSOR ) 100 MG tablet, Take 100 mg by mouth 2 (two) times daily., Disp: , Rfl:    Multiple Vitamin (MULTIVITAMIN WITH MINERALS) TABS tablet, Take 1 tablet by mouth in the morning., Disp: , Rfl:    Nutritional Supplements (FEEDING SUPPLEMENT, OSMOLITE 1.5 CAL,) LIQD, Give 2 cartons at 8am and noon via feeding tube.  Give 1 carton at 4pm and 8pm.  Flush with 60ml of water  before and after each feeding.  Give additional 3 cups of water  via tube for hydration if unable to drink orally., Disp: , Rfl:    ondansetron  (ZOFRAN ) 8 MG tablet, Take 1 tablet (8 mg total) by mouth every 8 (eight) hours as needed for nausea or vomiting. Start on the third day after cisplatin ., Disp: 30 tablet, Rfl: 1   Oxycodone  HCl 20 MG TABS, Take 1 tablet (20 mg total) by mouth every 8 (eight) hours as needed., Disp: 90 tablet, Rfl: 0   pantoprazole  (PROTONIX ) 20 MG tablet, Take 1 tablet (20 mg total) by mouth 2 (two) times daily for 14 days., Disp: 28 tablet, Rfl: 0   amoxicillin -clavulanate (AUGMENTIN ) 875-125 MG tablet, Take 1 tablet by mouth every 12 (twelve) hours. (Patient not taking: Reported on 04/18/2024), Disp: 14 tablet, Rfl: 0   fluconazole  (DIFLUCAN ) 100 MG tablet, TAKE 2 TABLETS BY MOUTH TODAY, THEN 1 TABLET DAILY FOR 20 MORE DAYS (Patient not taking: Reported on 04/18/2024), Disp: 22 tablet, Rfl: 0   lidocaine -prilocaine  (EMLA ) cream, Apply to affected area once (Patient not taking: Reported on 04/18/2024), Disp: 30 g, Rfl: 3   magnesium  oxide (MAG-OX) 400 (240 Mg) MG tablet, Take 1 tablet (400 mg total) by mouth daily. (Patient not taking: Reported on 04/18/2024), Disp: 30 tablet, Rfl: 0   mirtazapine (REMERON) 15 MG tablet, Take 15 mg by mouth at bedtime. (Patient not taking: Reported on 04/18/2024), Disp: , Rfl:    potassium chloride  SA (KLOR-CON  M) 20 MEQ tablet, Take 1 tablet (20 mEq total) by mouth 2 (two) times daily. (Patient not taking: Reported on  04/18/2024), Disp: 60 tablet, Rfl: 1   prochlorperazine  (COMPAZINE ) 10 MG tablet, Take 1 tablet (10 mg total) by mouth every 6 (six) hours as needed (Nausea or vomiting). (Patient not taking: Reported on  04/18/2024), Disp: 30 tablet, Rfl: 1   sodium fluoride  (FLUORISHIELD) 1.1 % GEL dental gel, Place 1 Application onto teeth at bedtime. (Patient not taking: Reported on 04/18/2024), Disp: , Rfl:    sucralfate  (CARAFATE ) 1 g tablet, Take 1 tablet (1 g total) by mouth with breakfast, with lunch, and with evening meal for 7 days. (Patient not taking: Reported on 04/18/2024), Disp: 21 tablet, Rfl: 0   Zinc  Oxide (TRIPLE PASTE) 12.8 % ointment, Apply 1 Application topically as needed for irritation. (Patient not taking: Reported on 04/18/2024), Disp: 56.7 g, Rfl: 0    Physical Exam: Blood pressure 120/68, pulse 87, height 5' 9 (1.753 m), weight 147 lb 3.2 oz (66.8 kg), SpO2 96%.    Chronically ill male  HEENT: right sided subclavian port a cath  Neck supple with no adenopathy JVP normal no bruits no thyromegaly Lungs emphysema exp wheezes post LULobectomy Heart:  S1/S2 2/6/ SEM murmur, no rub, gallop or click PMI normal Abdomen: benighn, BS positve, no tenderness, no AAA no bruit.  No HSM or HJR G tube in place  Distal pulses intact with no bruits No edema Post penile implant with wound dehiscence    Labs:   Lab Results  Component Value Date   WBC 8.9 02/27/2024   HGB 13.7 02/27/2024   HCT 40.7 02/27/2024   MCV 94.9 02/27/2024   PLT 280 02/27/2024   No results for input(s): NA, K, CL, CO2, BUN, CREATININE, CALCIUM, PROT, BILITOT, ALKPHOS, ALT, AST, GLUCOSE in the last 168 hours.  Invalid input(s): LABALBU Lab Results  Component Value Date   CKTOTAL 158 03/12/2007   CKMB 1.2 03/12/2007   TROPONINI <0.03 12/16/2017    Lab Results  Component Value Date   CHOL 165 06/05/2020   CHOL 196 09/03/2019   CHOL 174 08/20/2015   Lab Results  Component Value Date    HDL 59 06/05/2020   HDL 63 09/03/2019   HDL 59 08/20/2015   Lab Results  Component Value Date   LDLCALC 84 06/05/2020   LDLCALC 110 (H) 09/03/2019   LDLCALC 90 08/20/2015   Lab Results  Component Value Date   TRIG 111 06/05/2020   TRIG 114 09/03/2019   TRIG 126 08/20/2015   Lab Results  Component Value Date   CHOLHDL 2.8 06/05/2020   CHOLHDL 3.1 09/03/2019   CHOLHDL 2.9 08/20/2015   No results found for: LDLDIRECT    Radiology: No results found.  EKG: 04/18/2024 ST rate 105 otherwise normal    ASSESSMENT AND PLAN:   Cardiomegaly:  not clear of diagnosis Normal LV size EF and mild LVH on TTE 10/21/21  HTN:  Well controlled.  Continue current medications and low sodium Dash type diet.   Lung Cancer:  still smoking f/u oncology known metastatic disease currently observation post chemo and LULobectomy Port a cath in place ETOH:  counseled on moderation AST minimally elevated 53 labs 06/08/21  Neuropathy:  cancer/chemo on gabapentin  Arthralgias:  been referred to rheumatology ? From immunoRx continue oxycodone   Urology:  Finished Bactrim  f/u [facility] for penile implant infection Tachycardia:  sinus see above increase Toprol  to 100 mg daily  Head/Neck cancer :  supraglottic post chemo done 09/2023 PeG tube to supplement nutrition f/u oncology  F/U cardiology in a year  Signed: Maude Emmer 04/18/2024, 11:21 AM

## 2024-04-18 ENCOUNTER — Encounter: Payer: Self-pay | Admitting: Cardiovascular Disease

## 2024-04-18 ENCOUNTER — Ambulatory Visit: Attending: Cardiovascular Disease | Admitting: Cardiovascular Disease

## 2024-04-18 VITALS — BP 120/68 | HR 87 | Ht 69.0 in | Wt 147.2 lb

## 2024-04-18 DIAGNOSIS — I517 Cardiomegaly: Secondary | ICD-10-CM | POA: Diagnosis not present

## 2024-04-18 DIAGNOSIS — R000 Tachycardia, unspecified: Secondary | ICD-10-CM

## 2024-04-18 DIAGNOSIS — I10 Essential (primary) hypertension: Secondary | ICD-10-CM

## 2024-04-18 NOTE — Patient Instructions (Signed)
 Medication Instructions:  Your physician recommends that you continue on your current medications as directed. Please refer to the Current Medication list given to you today.   Labwork: None today  Testing/Procedures: None today  Follow-Up: 1 year  Any Other Special Instructions Will Be Listed Below (If Applicable).  If you need a refill on your cardiac medications before your next appointment, please call your pharmacy.

## 2024-04-22 NOTE — Progress Notes (Signed)
 Radiation Oncology         [phone] ________________________________  Name: DEKLIN BIELER MRN: [id number]  Date: 04/23/2024  DOB: 11-19-1960  Follow-Up Visit Note   CC: Carlette Benita Area, MD  [name], [name]*  Diagnosis and Prior Radiotherapy:  C05.1 No diagnosis found. 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 [facility]) Staging form: Larynx - Supraglottis, AJCC 8th Edition - Clinical: Stage III (cT3, cN1, cM0) - Signed by Autumn Millman, MD on 09/08/2023  First Treatment Date: 2023-07-31 Last Treatment Date: 2023-09-19   Plan Name: HN_Throat Site: Laryngopharynx Technique: IMRT Mode: Photon Dose Per Fraction: 2 Gy Prescribed Dose (Delivered / Prescribed): 70 Gy / 70 Gy Prescribed Fxs (Delivered / Prescribed): 35 / 35   Stage III (cT3, N1, 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    Mr. Maclellan presents to the clinic today for routine follow-up.   ***  Patient last saw Dr. Llewellyn on 01/08/2024. Laryngoscopy was performed with no evidence of disease recurrence.   Mr. Cleughlin met with Dr. Rogers on 02/29/2024. No evidence of disease was noted at that time.   There were no vitals taken for this visit.  Wt Readings from Last 3 Encounters:  04/18/24  147 lb 3.2 oz (66.8 kg)  02/29/24 143 lb 15.4 oz (65.3 kg)  02/27/24 145 lb 11.6 oz (66.1 kg)     ALLERGIES:  has no known allergies.  Meds: Current Outpatient Medications  Medication Sig Dispense Refill   acetaminophen  (TYLENOL ) 500 MG tablet Take 2 tablets (1,000 mg total) by mouth every 6 (six) hours. 45 tablet 0   albuterol  (VENTOLIN  HFA) 108 (90 Base) MCG/ACT inhaler Inhale 1-2 puffs into the lungs every 6 (six) hours as needed for wheezing or shortness of breath.     ALPRAZolam  (XANAX ) 0.5 MG tablet Take 1 tablet (0.5 mg total) by mouth at bedtime as needed for anxiety. 30 tablet 0   amLODipine  (NORVASC ) 2.5 MG tablet Take 2.5 mg by mouth every morning.     amoxicillin -clavulanate (AUGMENTIN ) 875-125 MG tablet Take 1 tablet by mouth every 12 (twelve) hours. (Patient not taking: Reported on 04/18/2024) 14 tablet 0   fluconazole  (DIFLUCAN ) 100 MG tablet TAKE 2 TABLETS BY MOUTH TODAY, THEN 1 TABLET DAILY FOR 20 MORE DAYS (Patient not taking: Reported on 04/18/2024) 22 tablet 0   Fluticasone -Umeclidin-Vilant (TRELEGY ELLIPTA) 100-62.5-25 MCG/INH AEPB Inhale 1 puff into the lungs in the morning.     lidocaine  (XYLOCAINE ) 2 % solution Patient: Mix 1part 2% viscous lidocaine , 1part H20. Swish & swallow 10mL of diluted mixture, 30min before meals and at bedtime, up to QID 480 mL 3   lidocaine -prilocaine  (EMLA ) cream Apply to affected area once (Patient not taking: Reported on 04/18/2024) 30 g 3   loratadine (CLARITIN) 10 MG tablet Take 10 mg by mouth daily.  magnesium  oxide (MAG-OX) 400 (240 Mg) MG tablet Take 1 tablet (400 mg total) by mouth daily. (Patient not taking: Reported on 04/18/2024) 30 tablet 0   metoprolol  tartrate (LOPRESSOR ) 100 MG tablet Take 100 mg by mouth 2 (two) times daily.     mirtazapine (REMERON) 15 MG tablet Take 15 mg by mouth at bedtime. (Patient not taking: Reported on 04/18/2024)     Multiple Vitamin (MULTIVITAMIN WITH MINERALS) TABS tablet Take 1 tablet by mouth in  the morning.     Nutritional Supplements (FEEDING SUPPLEMENT, OSMOLITE 1.5 CAL,) LIQD Give 2 cartons at 8am and noon via feeding tube.  Give 1 carton at 4pm and 8pm.  Flush with 60ml of water  before and after each feeding.  Give additional 3 cups of water  via tube for hydration if unable to drink orally.     ondansetron  (ZOFRAN ) 8 MG tablet Take 1 tablet (8 mg total) by mouth every 8 (eight) hours as needed for nausea or vomiting. Start on the third day after cisplatin . 30 tablet 1   Oxycodone  HCl 20 MG TABS Take 1 tablet (20 mg total) by mouth every 8 (eight) hours as needed. 90 tablet 0   pantoprazole  (PROTONIX ) 20 MG tablet Take 1 tablet (20 mg total) by mouth 2 (two) times daily for 14 days. 28 tablet 0   potassium chloride  SA (KLOR-CON  M) 20 MEQ tablet Take 1 tablet (20 mEq total) by mouth 2 (two) times daily. (Patient not taking: Reported on 04/18/2024) 60 tablet 1   prochlorperazine  (COMPAZINE ) 10 MG tablet Take 1 tablet (10 mg total) by mouth every 6 (six) hours as needed (Nausea or vomiting). (Patient not taking: Reported on 04/18/2024) 30 tablet 1   sodium fluoride  (FLUORISHIELD) 1.1 % GEL dental gel Place 1 Application onto teeth at bedtime. (Patient not taking: Reported on 04/18/2024)     sucralfate  (CARAFATE ) 1 g tablet Take 1 tablet (1 g total) by mouth with breakfast, with lunch, and with evening meal for 7 days. (Patient not taking: Reported on 04/18/2024) 21 tablet 0   Zinc  Oxide (TRIPLE PASTE) 12.8 % ointment Apply 1 Application topically as needed for irritation. (Patient not taking: Reported on 04/18/2024) 56.7 g 0   No current facility-administered medications for this visit.    Physical Findings:  Deferred, due to nature of telephone visit.    Lab Findings: Lab Results  Component Value Date   WBC 8.9 02/27/2024   HGB 13.7 02/27/2024   HCT 40.7 02/27/2024   MCV 94.9 02/27/2024   PLT 280 02/27/2024    Lab Results  Component Value Date   TSH 1.610 02/27/2024     Radiographic Findings: No results found.   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 Izell personally reviewed this patient's imaging. PET demonstrates a positive response to treatment. We will discuss his case at our tumor board tomorrow.   2) Nutritional Status: Encouraged patient to continue to push calories through oral intake. He is seeing nutrition later today *** Wt Readings from Last 3 Encounters:  04/18/24 147 lb 3.2 oz (66.8 kg)  02/29/24 143 lb 15.4 oz (65.3 kg)  02/27/24 145 lb 11.6 oz (66.1 kg)  PEG tube: using, about 4 cartons a day ***  3) Risk Factors: The patient has been educated about risk factors including alcohol  and tobacco abuse; they understand that avoidance of alcohol  and tobacco is important  to prevent recurrences as well as other cancers. Patient is going to call [id number] for nicotine  replacement to be sent to his house. He is not interested in picking a quit date at this time. ***  4) Swallowing: good function, continue SLP exercises  ***  5) Dental: Encouraged to continue regular followup with dentistry, and dental hygiene including fluoride  rinses.  He has fluoride  gel from Dr Danial. Delon Malmfelt previously gave him a Programmer, multimedia; however, he does not know where this went. We will resend this list to him.  ***  6) Thyroid  function:  checking annually  Lab Results  Component Value Date   TSH 1.610 02/27/2024    8) Patient will follow-up with Dr. Llewellyn in 3 months and medical oncology in 4 months. Follow-up with rad onc in ***. The patient was encouraged to call with any issues or questions before then.  ***  On date of service, in total, I spent 25 minutes on this encounter. Patient was seen in person. *** _____________________________________    Leeroy Due, PA-C

## 2024-04-23 ENCOUNTER — Encounter: Payer: Self-pay | Admitting: Radiology

## 2024-04-23 ENCOUNTER — Ambulatory Visit
Admission: RE | Admit: 2024-04-23 | Discharge: 2024-04-23 | Disposition: A | Discharge status: Home / Self Care | Source: Ambulatory Visit | Attending: Radiology | Admitting: Radiology

## 2024-04-23 VITALS — BP 129/63 | HR 88 | Temp 97.2°F | Resp 18 | Ht 69.0 in | Wt 149.2 lb

## 2024-04-23 DIAGNOSIS — B370 Candidal stomatitis: Secondary | ICD-10-CM | POA: Insufficient documentation

## 2024-04-23 DIAGNOSIS — Z923 Personal history of irradiation: Secondary | ICD-10-CM | POA: Insufficient documentation

## 2024-04-23 DIAGNOSIS — G893 Neoplasm related pain (acute) (chronic): Secondary | ICD-10-CM | POA: Diagnosis not present

## 2024-04-23 DIAGNOSIS — C3492 Malignant neoplasm of unspecified part of left bronchus or lung: Secondary | ICD-10-CM | POA: Diagnosis not present

## 2024-04-23 DIAGNOSIS — Z9221 Personal history of antineoplastic chemotherapy: Secondary | ICD-10-CM | POA: Insufficient documentation

## 2024-04-23 DIAGNOSIS — Z79899 Other long term (current) drug therapy: Secondary | ICD-10-CM | POA: Diagnosis not present

## 2024-04-23 DIAGNOSIS — C321 Malignant neoplasm of supraglottis: Secondary | ICD-10-CM | POA: Diagnosis present

## 2024-04-23 DIAGNOSIS — F17210 Nicotine dependence, cigarettes, uncomplicated: Secondary | ICD-10-CM | POA: Diagnosis not present

## 2024-04-23 DIAGNOSIS — Z720 Tobacco use: Secondary | ICD-10-CM

## 2024-04-23 MED ORDER — OXYMETAZOLINE HCL 0.05 % NA SOLN
1.0000 | Freq: Once | NASAL | Status: DC
  Filled 2024-04-23: qty 30

## 2024-04-23 MED ORDER — BUPROPION HCL ER (SR) 150 MG PO TB12: ORAL_TABLET | ORAL | 2 refills | Status: AC

## 2024-04-23 MED ORDER — CLOTRIMAZOLE 10 MG MT TROC: 10.0000 mg | Freq: Every day | OROMUCOSAL | 0 refills | Status: AC

## 2024-04-23 NOTE — Progress Notes (Addendum)
 Bryan Fleming is here for a follow-up appointment today with Leeroy Due PA-C.  Diagnosis: C05.1 Malignant neoplasm of soft palate  Treatment Completion Date: 09/19/2023  Pain issues, if any: 8/10 throat pain Using a feeding tube?: He can take soft food but has PEG tube for supplemental nutrition.  Weight changes, if any:  Wt. Readings from the last 3 encounters 04/23/24 - 149.2 lb 02/29/24 - 143.0 lb 11/20/23 129 lbs 3.2 oz 10/04/23 132.0 lbs  Swallowing issues, if any: No issues Smoking or chewing tobacco? Smoking 1/2 ppd  Using fluoride  toothpaste daily? Yes Last ENT visit was on: 01/08/24 Dr. Gerard Shope Other notable issues, if any:   BP 129/63 (BP Location: Right Arm, Patient Position: Sitting, Cuff Size: Normal)   Pulse 88   Temp (!) 97.2 F (36.2 C)   Resp 18   Ht 5' 9 (1.753 m)   Wt 149 lb 3.2 oz (67.7 kg)   SpO2 100%   BMI 22.03 kg/m

## 2024-04-24 ENCOUNTER — Other Ambulatory Visit: Payer: Self-pay

## 2024-05-04 ENCOUNTER — Telehealth: Payer: Self-pay | Admitting: Dietician

## 2024-05-04 NOTE — Telephone Encounter (Signed)
 Called to touch base with the patient about rescheduling his nutrition appointment, per 9/16 inbasket message. Talked with the patient who states he does not wish to be rescheduled at this time. Patient was informed that if he does wish to be rescheduled, to give our office a call. Patient expressed understanding.

## 2024-05-20 ENCOUNTER — Other Ambulatory Visit: Payer: Self-pay | Admitting: *Deleted

## 2024-05-20 ENCOUNTER — Encounter: Payer: Self-pay | Admitting: Oncology

## 2024-05-20 DIAGNOSIS — G893 Neoplasm related pain (acute) (chronic): Secondary | ICD-10-CM

## 2024-05-20 MED ORDER — OXYCODONE HCL 20 MG PO TABS: 20.0000 mg | ORAL_TABLET | Freq: Three times a day (TID) | ORAL | 0 refills | Status: DC | PRN

## 2024-05-30 ENCOUNTER — Inpatient Hospital Stay

## 2024-06-10 ENCOUNTER — Encounter: Payer: Self-pay | Admitting: Oncology

## 2024-06-17 ENCOUNTER — Other Ambulatory Visit: Payer: Self-pay | Admitting: *Deleted

## 2024-06-17 DIAGNOSIS — G893 Neoplasm related pain (acute) (chronic): Secondary | ICD-10-CM

## 2024-06-17 MED ORDER — OXYCODONE HCL 20 MG PO TABS: 20.0000 mg | ORAL_TABLET | Freq: Three times a day (TID) | ORAL | 0 refills | Status: DC | PRN

## 2024-07-16 ENCOUNTER — Other Ambulatory Visit: Payer: Self-pay

## 2024-07-16 DIAGNOSIS — I739 Peripheral vascular disease, unspecified: Secondary | ICD-10-CM

## 2024-07-17 ENCOUNTER — Telehealth: Payer: Self-pay | Admitting: Radiation Oncology

## 2024-07-17 NOTE — Telephone Encounter (Signed)
 12/10 Received Medical Record request for release of Dosim Records.  Copy given to Dosimetry Dept.

## 2024-07-19 ENCOUNTER — Other Ambulatory Visit: Payer: Self-pay

## 2024-07-19 DIAGNOSIS — G893 Neoplasm related pain (acute) (chronic): Secondary | ICD-10-CM

## 2024-07-19 MED ORDER — OXYCODONE HCL 20 MG PO TABS: 20.0000 mg | ORAL_TABLET | Freq: Three times a day (TID) | ORAL | 0 refills | Status: DC | PRN

## 2024-08-05 ENCOUNTER — Encounter: Payer: Self-pay | Admitting: *Deleted

## 2024-08-06 ENCOUNTER — Encounter

## 2024-08-07 ENCOUNTER — Encounter: Payer: Self-pay | Admitting: Oncology

## 2024-08-13 ENCOUNTER — Encounter: Admitting: Vascular Surgery

## 2024-08-20 ENCOUNTER — Encounter: Payer: Self-pay | Admitting: Oncology

## 2024-08-22 ENCOUNTER — Other Ambulatory Visit: Payer: Self-pay

## 2024-08-22 ENCOUNTER — Ambulatory Visit (HOSPITAL_COMMUNITY)
Admission: RE | Admit: 2024-08-22 | Discharge: 2024-08-22 | Disposition: A | Discharge status: Home / Self Care | Source: Ambulatory Visit | Attending: Hematology | Admitting: Hematology

## 2024-08-22 ENCOUNTER — Encounter: Payer: Self-pay | Admitting: Oncology

## 2024-08-22 ENCOUNTER — Inpatient Hospital Stay: Attending: Radiation Oncology

## 2024-08-22 DIAGNOSIS — Z79899 Other long term (current) drug therapy: Secondary | ICD-10-CM | POA: Diagnosis not present

## 2024-08-22 DIAGNOSIS — D509 Iron deficiency anemia, unspecified: Secondary | ICD-10-CM | POA: Insufficient documentation

## 2024-08-22 DIAGNOSIS — Z902 Acquired absence of lung [part of]: Secondary | ICD-10-CM | POA: Insufficient documentation

## 2024-08-22 DIAGNOSIS — C321 Malignant neoplasm of supraglottis: Secondary | ICD-10-CM | POA: Insufficient documentation

## 2024-08-22 DIAGNOSIS — D508 Other iron deficiency anemias: Secondary | ICD-10-CM

## 2024-08-22 DIAGNOSIS — C3492 Malignant neoplasm of unspecified part of left bronchus or lung: Secondary | ICD-10-CM

## 2024-08-22 DIAGNOSIS — H748X1 Other specified disorders of right middle ear and mastoid: Secondary | ICD-10-CM | POA: Insufficient documentation

## 2024-08-22 DIAGNOSIS — Z85118 Personal history of other malignant neoplasm of bronchus and lung: Secondary | ICD-10-CM | POA: Diagnosis present

## 2024-08-22 DIAGNOSIS — Z8521 Personal history of malignant neoplasm of larynx: Secondary | ICD-10-CM | POA: Diagnosis not present

## 2024-08-22 DIAGNOSIS — J323 Chronic sphenoidal sinusitis: Secondary | ICD-10-CM | POA: Insufficient documentation

## 2024-08-22 DIAGNOSIS — R918 Other nonspecific abnormal finding of lung field: Secondary | ICD-10-CM | POA: Insufficient documentation

## 2024-08-22 DIAGNOSIS — K8689 Other specified diseases of pancreas: Secondary | ICD-10-CM | POA: Diagnosis not present

## 2024-08-22 DIAGNOSIS — G893 Neoplasm related pain (acute) (chronic): Secondary | ICD-10-CM

## 2024-08-22 LAB — CBC WITH DIFFERENTIAL/PLATELET
Abs Immature Granulocytes: 0.02 K/uL (ref 0.00–0.07)
Basophils Absolute: 0.1 K/uL (ref 0.0–0.1)
Basophils Relative: 1 %
Eosinophils Absolute: 0 K/uL (ref 0.0–0.5)
Eosinophils Relative: 0 %
HCT: 43.8 % (ref 39.0–52.0)
Hemoglobin: 14.3 g/dL (ref 13.0–17.0)
Immature Granulocytes: 0 %
Lymphocytes Relative: 16 %
Lymphs Abs: 1.6 K/uL (ref 0.7–4.0)
MCH: 31.8 pg (ref 26.0–34.0)
MCHC: 32.6 g/dL (ref 30.0–36.0)
MCV: 97.3 fL (ref 80.0–100.0)
Monocytes Absolute: 0.8 K/uL (ref 0.1–1.0)
Monocytes Relative: 8 %
Neutro Abs: 7.3 K/uL (ref 1.7–7.7)
Neutrophils Relative %: 75 %
Platelets: 292 K/uL (ref 150–400)
RBC: 4.5 MIL/uL (ref 4.22–5.81)
RDW: 14.6 % (ref 11.5–15.5)
WBC: 9.8 K/uL (ref 4.0–10.5)
nRBC: 0 % (ref 0.0–0.2)

## 2024-08-22 LAB — COMPREHENSIVE METABOLIC PANEL WITH GFR
ALT: 24 U/L (ref 0–44)
AST: 44 U/L — ABNORMAL HIGH (ref 15–41)
Albumin: 3.9 g/dL (ref 3.5–5.0)
Alkaline Phosphatase: 305 U/L — ABNORMAL HIGH (ref 38–126)
Anion gap: 20 — ABNORMAL HIGH (ref 5–15)
BUN: 9 mg/dL (ref 8–23)
CO2: 18 mmol/L — ABNORMAL LOW (ref 22–32)
Calcium: 9.3 mg/dL (ref 8.9–10.3)
Chloride: 97 mmol/L — ABNORMAL LOW (ref 98–111)
Creatinine, Ser: 0.55 mg/dL — ABNORMAL LOW (ref 0.61–1.24)
GFR, Estimated: >60 mL/min
Glucose, Bld: 84 mg/dL (ref 70–99)
Potassium: 3.8 mmol/L (ref 3.5–5.1)
Sodium: 136 mmol/L (ref 135–145)
Total Bilirubin: 0.5 mg/dL (ref 0.0–1.2)
Total Protein: 7.6 g/dL (ref 6.5–8.1)

## 2024-08-22 LAB — TSH: TSH: 1.77 u[IU]/mL (ref 0.350–4.500)

## 2024-08-22 LAB — IRON AND TIBC
Iron: 78 ug/dL (ref 45–182)
Saturation Ratios: 27 % (ref 17.9–39.5)
TIBC: 290 ug/dL (ref 250–450)
UIBC: 212 ug/dL

## 2024-08-22 LAB — FERRITIN: Ferritin: 177 ng/mL (ref 24–336)

## 2024-08-22 MED ORDER — IOHEXOL 300 MG/ML  SOLN
100.0000 mL | Freq: Once | INTRAMUSCULAR | Status: AC | PRN
  Administered 2024-08-22: 100 mL via INTRAVENOUS

## 2024-08-22 MED ORDER — OXYCODONE HCL 20 MG PO TABS: 20.0000 mg | ORAL_TABLET | Freq: Three times a day (TID) | ORAL | 0 refills | Status: AC | PRN

## 2024-08-22 NOTE — Progress Notes (Signed)
 Patients port flushed without difficulty.  Good blood return noted with no bruising or swelling noted at site.  Labs drawn per orders.  VSS with discharge and left in satisfactory condition with no s/s of distress noted. All follow ups as scheduled.       Bryan Fleming

## 2024-08-27 ENCOUNTER — Encounter: Payer: Self-pay | Admitting: Oncology

## 2024-08-29 ENCOUNTER — Inpatient Hospital Stay: Admitting: Oncology

## 2024-08-29 NOTE — Progress Notes (Unsigned)
 "  [facility] OFFICE PROGRESS NOTE  [name], Benita Area, MD  ASSESSMENT & PLAN:    Assessment & Plan   No orders of the defined types were placed in this encounter.   INTERVAL HISTORY: Patient returns for follow-up.  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)  07/19/2023 Initial Diagnosis   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)   07/19/2023 Cancer Staging   Staging form: Larynx - Supraglottis, AJCC 8th Edition - Clinical: Stage III (cT3, cN1, cM0) - Signed by Autumn Millman, MD on 09/08/2023   Cancer of soft palate (HCC)  07/19/2023 Initial Diagnosis   Cancer of soft palate (HCC)   07/19/2023 Cancer Staging   Staging form: Pharynx - P16 Negative Oropharynx, AJCC 8th Edition - Clinical stage from 07/19/2023: Stage III (cT3, cN1, cM0, p16-) - Signed by Izell Domino, MD on 07/19/2023 Stage prefix: Initial diagnosis   07/31/2023 -  Chemotherapy   Patient is on Treatment Plan : HEAD/NECK Cisplatin  (40) q7d     Malignant neoplasm of supraglottis (HCC) (Resolved)  07/19/2023 Initial Diagnosis   Malignant neoplasm of supraglottis (HCC)      CBC    Component Value Date/Time   WBC 9.8 08/22/2024 1312   RBC 4.50 08/22/2024 1312   HGB 14.3 08/22/2024 1312   HGB 9.9 (L) 09/19/2023 1350   HCT 43.8 08/22/2024 1312   PLT 292 08/22/2024 1312   PLT 218 09/19/2023 1350   MCV 97.3  08/22/2024 1312   MCH 31.8 08/22/2024 1312   MCHC 32.6 08/22/2024 1312   RDW 14.6 08/22/2024 1312   LYMPHSABS 1.6 08/22/2024 1312   MONOABS 0.8 08/22/2024 1312   EOSABS 0.0 08/22/2024 1312   BASOSABS 0.1 08/22/2024 1312       Latest Ref Rng & Units 08/22/2024    1:12 PM 02/27/2024   12:53 PM 10/25/2023    2:28 PM  CMP  Glucose 70 - 99 mg/dL 84  860  884   BUN 8 - 23 mg/dL 9  12  20    Creatinine 0.61 - 1.24 mg/dL 9.44  9.38  9.26   Sodium 135 - 145 mmol/L 136  136  135   Potassium 3.5 - 5.1 mmol/L 3.8  3.9  3.4   Chloride 98 - 111 mmol/L 97  99  93   CO2 22 - 32 mmol/L 18  26  25    Calcium 8.9 - 10.3 mg/dL 9.3  9.2  9.6   Total Protein 6.5 - 8.1 g/dL 7.6  7.6  7.5   Total Bilirubin 0.0 - 1.2 mg/dL 0.5  0.3  0.2   Alkaline Phos 38 - 126 U/L 305  95  73   AST 15 - 41 U/L 44  23  20   ALT 0 - 44 U/L 24  14  11       Lab Results  Component Value Date   FERRITIN 177 08/22/2024  [id number] 480 02/27/2024    There were no vitals filed for this visit.  Review of System:  ROS  Physical Exam: Physical Exam   I spent *** minutes dedicated to the care of this patient (face-to-face and non-face-to-face) on the date of the encounter to include what is described in the assessment and plan.,  Delon Hope, NP 08/29/2024 10:01 AM "

## 2024-09-05 ENCOUNTER — Emergency Department (HOSPITAL_COMMUNITY)

## 2024-09-05 ENCOUNTER — Encounter (HOSPITAL_COMMUNITY): Payer: Self-pay

## 2024-09-05 ENCOUNTER — Encounter: Payer: Self-pay | Admitting: Oncology

## 2024-09-05 ENCOUNTER — Emergency Department (HOSPITAL_COMMUNITY): Admission: EM | Admit: 2024-09-05 | Discharge: 2024-09-06 | Disposition: A | Discharge status: Home / Self Care

## 2024-09-05 ENCOUNTER — Other Ambulatory Visit: Payer: Self-pay

## 2024-09-05 DIAGNOSIS — I6522 Occlusion and stenosis of left carotid artery: Secondary | ICD-10-CM | POA: Insufficient documentation

## 2024-09-05 DIAGNOSIS — I700 Atherosclerosis of aorta: Secondary | ICD-10-CM | POA: Insufficient documentation

## 2024-09-05 DIAGNOSIS — R202 Paresthesia of skin: Secondary | ICD-10-CM | POA: Insufficient documentation

## 2024-09-05 DIAGNOSIS — J439 Emphysema, unspecified: Secondary | ICD-10-CM | POA: Insufficient documentation

## 2024-09-05 DIAGNOSIS — D72829 Elevated white blood cell count, unspecified: Secondary | ICD-10-CM | POA: Insufficient documentation

## 2024-09-05 DIAGNOSIS — Z923 Personal history of irradiation: Secondary | ICD-10-CM | POA: Diagnosis not present

## 2024-09-05 DIAGNOSIS — I6502 Occlusion and stenosis of left vertebral artery: Secondary | ICD-10-CM | POA: Diagnosis not present

## 2024-09-05 DIAGNOSIS — Z8521 Personal history of malignant neoplasm of larynx: Secondary | ICD-10-CM | POA: Diagnosis not present

## 2024-09-05 LAB — COMPREHENSIVE METABOLIC PANEL WITH GFR
ALT: 8 U/L (ref 0–44)
AST: 19 U/L (ref 15–41)
Albumin: 4.1 g/dL (ref 3.5–5.0)
Alkaline Phosphatase: 131 U/L — ABNORMAL HIGH (ref 38–126)
Anion gap: 14 (ref 5–15)
BUN: 8 mg/dL (ref 8–23)
CO2: 25 mmol/L (ref 22–32)
Calcium: 9.7 mg/dL (ref 8.9–10.3)
Chloride: 99 mmol/L (ref 98–111)
Creatinine, Ser: 0.54 mg/dL — ABNORMAL LOW (ref 0.61–1.24)
GFR, Estimated: >60 mL/min
Glucose, Bld: 99 mg/dL (ref 70–99)
Potassium: 3.7 mmol/L (ref 3.5–5.1)
Sodium: 138 mmol/L (ref 135–145)
Total Bilirubin: 0.5 mg/dL (ref 0.0–1.2)
Total Protein: 7.5 g/dL (ref 6.5–8.1)

## 2024-09-05 LAB — DIFFERENTIAL
Abs Immature Granulocytes: 0.04 10*3/uL (ref 0.00–0.07)
Basophils Absolute: 0 10*3/uL (ref 0.0–0.1)
Basophils Relative: 0 %
Eosinophils Absolute: 0 10*3/uL (ref 0.0–0.5)
Eosinophils Relative: 0 %
Immature Granulocytes: 0 %
Lymphocytes Relative: 13 %
Lymphs Abs: 1.5 10*3/uL (ref 0.7–4.0)
Monocytes Absolute: 1.1 10*3/uL — ABNORMAL HIGH (ref 0.1–1.0)
Monocytes Relative: 9 %
Neutro Abs: 8.8 10*3/uL — ABNORMAL HIGH (ref 1.7–7.7)
Neutrophils Relative %: 78 %

## 2024-09-05 LAB — CBC
HCT: 42.3 % (ref 39.0–52.0)
Hemoglobin: 13.8 g/dL (ref 13.0–17.0)
MCH: 31.4 pg (ref 26.0–34.0)
MCHC: 32.6 g/dL (ref 30.0–36.0)
MCV: 96.4 fL (ref 80.0–100.0)
Platelets: 327 10*3/uL (ref 150–400)
RBC: 4.39 MIL/uL (ref 4.22–5.81)
RDW: 14.2 % (ref 11.5–15.5)
WBC: 11.5 10*3/uL — ABNORMAL HIGH (ref 4.0–10.5)
nRBC: 0 % (ref 0.0–0.2)

## 2024-09-05 LAB — APTT: aPTT: 26 s (ref 24–36)

## 2024-09-05 LAB — PROTIME-INR
INR: 1 (ref 0.8–1.2)
Prothrombin Time: 13.6 s (ref 11.4–15.2)

## 2024-09-05 LAB — CBG MONITORING, ED: Glucose-Capillary: 102 mg/dL — ABNORMAL HIGH (ref 70–99)

## 2024-09-05 MED ORDER — SODIUM CHLORIDE 0.9% FLUSH: 3.0000 mL | Freq: Once | INTRAVENOUS | Status: DC

## 2024-09-05 MED ORDER — HYDROMORPHONE HCL 1 MG/ML IJ SOLN
0.5000 mg | Freq: Once | INTRAMUSCULAR | Status: AC
  Administered 2024-09-05: 0.5 mg via INTRAVENOUS
  Filled 2024-09-05: qty 0.5

## 2024-09-05 MED ORDER — HEPARIN SOD (PORK) LOCK FLUSH 100 UNIT/ML IV SOLN
500.0000 [IU] | Freq: Once | INTRAVENOUS | Status: DC
  Filled 2024-09-05: qty 5

## 2024-09-05 MED ORDER — IOHEXOL 350 MG/ML SOLN
75.0000 mL | Freq: Once | INTRAVENOUS | Status: AC | PRN
  Administered 2024-09-05: 75 mL via INTRAVENOUS

## 2024-09-05 NOTE — ED Notes (Signed)
 ED Provider at bedside.

## 2024-09-05 NOTE — ED Provider Notes (Signed)
 " [place] [facility] AT [facility] Provider Note   CSN: [id number] Arrival date & time: 09/05/24  8165     Patient presents with: Numbness   [facility] is a 64 y.o. male patient with history of laryngeal cancer status post chemoradiation completed on 09/19/2023 who presents to the emergency department today for further evaluation of left facial numbness which has been present for the last week.  He states the numbness is localized to the left maxillary region.  Denies any weakness in the face, trouble speaking, trouble swallowing, focal weakness in the arms or legs, or focal numbness.  He denies any fever, chills, nausea, vomiting, diarrhea.   HPI     Prior to Admission medications  Medication Sig Start Date End Date Taking? Authorizing Provider  acetaminophen  (TYLENOL ) 500 MG tablet Take 2 tablets (1,000 mg total) by mouth every 6 (six) hours. 12/06/22   Lovie Arlyss CROME, MD  albuterol  (VENTOLIN  HFA) 108 (90 Base) MCG/ACT inhaler Inhale 1-2 puffs into the lungs every 6 (six) hours as needed for wheezing or shortness of breath.    [provider]  ALPRAZolam  (XANAX ) 0.5 MG tablet Take 1 tablet (0.5 mg total) by mouth at bedtime as needed for anxiety. 08/17/23   [name], Chinita, MD  amLODipine  (NORVASC ) 2.5 MG tablet Take 2.5 mg by mouth every morning. 02/08/24   [provider]  amoxicillin -clavulanate (AUGMENTIN ) 875-125 MG tablet Take 1 tablet by mouth every 12 (twelve) hours. Patient not taking: Reported on 04/18/2024 09/16/23   Elnor Savant A, DO  buPROPion  (WELLBUTRIN  SR) 150 MG 12 hr tablet Start one week before quit date. Take 1 tab daily x 3 days, then 1 tab BID thereafter. 04/23/24   Wyatt Leeroy HERO, PA-C  fluconazole  (DIFLUCAN ) 100 MG tablet TAKE 2 TABLETS BY MOUTH TODAY, THEN 1 TABLET DAILY FOR 20 MORE DAYS Patient not taking: Reported on 04/18/2024 02/01/24   Rogers Hai, MD  Fluticasone -Umeclidin-Vilant (TRELEGY ELLIPTA)  100-62.5-25 MCG/INH AEPB Inhale 1 puff into the lungs in the morning.    [provider]  lidocaine  (XYLOCAINE ) 2 % solution Patient: Mix 1part 2% viscous lidocaine , 1part H20. Swish & swallow 10mL of diluted mixture, before meals and at bedtime, up to QID 09/21/23   Rogers Hai, MD  lidocaine -prilocaine  (EMLA ) cream Apply to affected area once Patient not taking: Reported on 04/18/2024 07/21/23   [name], Chinita, MD  loratadine (CLARITIN) 10 MG tablet Take 10 mg by mouth daily. 07/11/23   [provider]  magnesium  oxide (MAG-OX) 400 (240 Mg) MG tablet Take 1 tablet (400 mg total) by mouth daily. Patient not taking: Reported on 04/18/2024 09/08/23   Autumn Chinita, MD  metoprolol  tartrate (LOPRESSOR ) 100 MG tablet Take 100 mg by mouth 2 (two) times daily. 02/08/24   [provider]  mirtazapine (REMERON) 15 MG tablet Take 15 mg by mouth at bedtime. Patient not taking: Reported on 04/18/2024 12/13/23   [provider]  Multiple Vitamin (MULTIVITAMIN WITH MINERALS) TABS tablet Take 1 tablet by mouth in the morning.    [provider]  Nutritional Supplements (FEEDING SUPPLEMENT, OSMOLITE 1.5 CAL,) LIQD Give 2 cartons at 8am and noon via feeding tube.  Give 1 carton at 4pm and 8pm.  Flush with 60ml of water  before and after each feeding.  Give additional 3 cups of water  via tube for hydration if unable to drink orally. 08/21/23   Izell Domino, MD  ondansetron  (ZOFRAN ) 8 MG tablet Take 1 tablet (  8 mg total) by mouth every 8 (eight) hours as needed for nausea or vomiting. Start on the third day after cisplatin . 07/21/23   [name], [name], MD  Oxycodone  HCl 20 MG TABS Take 1 tablet (20 mg total) by mouth every 8 (eight) hours as needed. 08/22/24   Geofm Delon BRAVO, NP  pantoprazole  (PROTONIX ) 20 MG tablet Take 1 tablet (20 mg total) by mouth 2 (two) times daily for 14 days. 09/16/23 04/18/24  Elnor Jayson LABOR, DO  potassium chloride  SA (KLOR-CON  M) 20 MEQ tablet  Take 1 tablet (20 mEq total) by mouth 2 (two) times daily. Patient not taking: Reported on 04/18/2024 08/03/23   [name], Chinita, MD  prochlorperazine  (COMPAZINE ) 10 MG tablet Take 1 tablet (10 mg total) by mouth every 6 (six) hours as needed (Nausea or vomiting). Patient not taking: Reported on 04/18/2024 07/21/23   [name], Chinita, MD  sodium fluoride  (FLUORISHIELD) 1.1 % GEL dental gel Place 1 Application onto teeth at bedtime. Patient not taking: Reported on 04/18/2024 07/13/23   [provider]  sucralfate  (CARAFATE ) 1 g tablet Take 1 tablet (1 g total) by mouth with breakfast, with lunch, and with evening meal for 7 days. Patient not taking: Reported on 04/18/2024 09/16/23 02/29/24  Elnor Jayson A, DO  Zinc  Oxide (TRIPLE PASTE) 12.8 % ointment Apply 1 Application topically as needed for irritation. Patient not taking: Reported on 04/18/2024 10/19/23   Allred, Darrell K, PA-C    Allergies: Patient has no known allergies.    Review of Systems  All other systems reviewed and are negative.   Updated Vital Signs BP (!) 140/66   Pulse 92   Temp 97.6 F (36.4 C) (Temporal)   Resp (!) 21   Ht 5' 9 (1.753 m)   Wt 63.5 kg   SpO2 91%   BMI 20.67 kg/m   Physical Exam Vitals and nursing note reviewed.  Constitutional:      General: He is not in acute distress.    Appearance: Normal appearance.  HENT:     Head: Normocephalic and atraumatic.  Eyes:     General:        Right eye: No discharge.        Left eye: No discharge.  Cardiovascular:     Comments: Regular rate and rhythm.  S1/S2 are distinct without any evidence of murmur, rubs, or gallops.  Radial pulses are 2+ bilaterally.  Dorsalis pedis pulses are 2+ bilaterally.  No evidence of pedal edema. Pulmonary:     Comments: Clear to auscultation bilaterally.  Normal effort.  No respiratory distress.  No evidence of wheezes, rales, or rhonchi heard throughout. Abdominal:     General: Abdomen is flat. Bowel sounds are normal. There  is no distension.     Tenderness: There is no abdominal tenderness. There is no guarding or rebound.  Musculoskeletal:        General: Normal range of motion.     Cervical back: Neck supple.  Skin:    General: Skin is warm and dry.     Findings: No rash.  Neurological:     General: No focal deficit present.     Mental Status: He is alert.     Comments: Cranial nerves II through XII are intact apart from subjective decrease sensation along the V2-V3 distribution of the trigeminal nerve on the left side.  5/5 strength to the upper and lower extremities.  Normal sensation to the upper and lower extremities.  Psychiatric:  Mood and Affect: Mood normal.        Behavior: Behavior normal.     (all labs ordered are listed, but only abnormal results are displayed) Labs Reviewed  CBC - Abnormal; Notable for the following components:      Result Value   WBC 11.5 (*)    All other components within normal limits  DIFFERENTIAL - Abnormal; Notable for the following components:   Neutro Abs 8.8 (*)    Monocytes Absolute 1.1 (*)    All other components within normal limits  COMPREHENSIVE METABOLIC PANEL WITH GFR - Abnormal; Notable for the following components:   Creatinine, Ser 0.54 (*)    Alkaline Phosphatase 131 (*)    All other components within normal limits  CBG MONITORING, ED - Abnormal; Notable for the following components:   Glucose-Capillary 102 (*)    All other components within normal limits  PROTIME-INR  APTT  I-STAT CHEM 8, ED    EKG: None  Radiology: CT ANGIO HEAD NECK W WO CM Result Date: 09/05/2024 CLINICAL DATA:  Initial evaluation for acute neuro deficit, stroke suspected. Left-sided facial numbness. EXAM: CT ANGIOGRAPHY HEAD AND NECK WITH AND WITHOUT CONTRAST TECHNIQUE: Multidetector CT imaging of the head and neck was performed using the standard protocol during bolus administration of intravenous contrast. Multiplanar CT image reconstructions and MIPs were  obtained to evaluate the vascular anatomy. Carotid stenosis measurements (when applicable) are obtained utilizing NASCET criteria, using the distal internal carotid diameter as the denominator. RADIATION DOSE REDUCTION: This exam was performed according to the departmental dose-optimization program which includes automated exposure control, adjustment of the mA and/or kV according to patient size and/or use of iterative reconstruction technique. CONTRAST:  75mL OMNIPAQUE  IOHEXOL  350 MG/ML SOLN COMPARISON:  Prior CT from 08/22/2024. FINDINGS: CT HEAD FINDINGS Brain: Mild age-related cerebral atrophy. No acute intracranial hemorrhage. No acute large vessel territory infarct. No mass lesion or midline shift. No hydrocephalus or extra-axial fluid collection. Vascular: No abnormal hyperdense vessel. Scattered vascular calcifications noted within the carotid siphons. Skull: Scalp soft tissues demonstrate no acute finding. Calvarium intact. Sinuses/Orbits: Globes and orbital soft tissues within normal limits. Changes of chronic left sphenoid sinusitis noted. Mild mucosal thickening about the visualized left maxillary sinus. Small to moderate right mastoid effusion. Left mastoid air cells are clear. Other: None. Review of the MIP images confirms the above findings CTA NECK FINDINGS Aortic arch: Visualized aortic arch within normal limits for caliber with standard branch pattern. Moderate aortic atherosclerosis. No significant stenosis about the origin the great vessels. Right carotid system: Right common and internal carotid arteries are patent without dissection. Bulky calcified plaque about the right carotid bulb with estimated 50% stenosis by NASCET criteria. Left carotid system: Left common and internal carotid arteries are patent without dissection. Mixed plaque about the left carotid bulb/proximal cervical left ICA with estimated 70% stenosis by NASCET criteria (series 16, image 206). Vertebral arteries: Left  vertebral artery arises directly from the aortic arch. Atheromatous plaque at the origin of left vertebral artery with severe stenosis. Right vertebral artery dominant. Vertebral arteries otherwise patent without significant stenosis or dissection. Skeleton: No worrisome osseous lesions. Other neck: Post treatment changes related to prior head neck malignancy noted. Mild mucosal thickening about the epiglottis is similar to prior, likely reflecting post treatment changes. No other acute finding. Upper chest: Emphysema. Right-sided Port-A-Cath in place. No other acute finding. Review of the MIP images confirms the above findings CTA HEAD FINDINGS Anterior circulation: Atheromatous change about the  carotid siphons bilaterally. Resultant up to moderate stenosis at the right carotid siphon. No significant stenosis about the left siphon. A1 segments patent bilaterally. Normal anterior communicating artery complex. Anterior cerebral arteries patent without significant stenosis. No M1 stenosis or occlusion. No proximal MCA branch occlusion or high-grade stenosis. Distal MCA branches perfused and symmetric. Posterior circulation: Both V4 segments patent without significant stenosis. Both PICA patent. Basilar patent without significant stenosis. Superior cerebellar and posterior cerebral arteries patent bilaterally. Venous sinuses: Patent allowing for timing the contrast bolus. Anatomic variants: None significant.  No aneurysm. Review of the MIP images confirms the above findings IMPRESSION: CT HEAD: 1. No acute intracranial abnormality. 2. Mild age-related cerebral atrophy. 3. Small to moderate right mastoid effusion. CTA HEAD AND NECK: 1. Negative CTA for large vessel occlusion or other emergent finding. 2. Mixed plaque about the left carotid bulb/proximal cervical left ICA with estimated 70% stenosis by NASCET criteria. 3. Bulky calcified plaque about the right carotid bulb with estimated 50% stenosis by NASCET criteria.  4. Atheromatous plaque at the origin of the left vertebral artery with severe stenosis. Left vertebral artery arises directly from the aortic arch. 5. Atheromatous change about the carotid siphons with resultant moderate stenosis on the right. Aortic Atherosclerosis (ICD10-I70.0) and Emphysema (ICD10-J43.9). Electronically Signed   By: Morene Hoard M.D.   On: 09/05/2024 23:29     Procedures   Medications Ordered in the ED  sodium chloride  flush (NS) 0.9 % injection 3 mL (3 mLs Intravenous Not Given 09/05/24 1942)  iohexol  (OMNIPAQUE ) 350 MG/ML injection 75 mL (75 mLs Intravenous Contrast Given 09/05/24 2217)  HYDROmorphone  (DILAUDID ) injection 0.5 mg (0.5 mg Intravenous Given 09/05/24 2237)    Clinical Course as of 09/05/24 2341  Thu Sep 05, 2024  2311 Protime-INR Negative. [CF]  2311 APTT Negative. [CF]  2311 CBC(!) There is evidence of leukocytosis. [CF]  2311 Comprehensive metabolic panel(!) Negative. [CF]  2311 CBG monitoring, ED(!) Normal. [CF]  2311 Differential(!) Negative. [CF]  2333 CT ANGIO HEAD NECK W WO CM No evidence of large vessel occlusion or obvious stroke.  There is some evidence of carotid artery stenosis.  Not seeing any evidence of laryngeal cancer.  I do agree with radiologist interpretation. [CF]    Clinical Course User Index [CF] Theotis Cameron HERO, PA-C    Medical Decision Making Bryan [facility] is a 64 y.o. male patient who presents to the emergency department today for further evaluation of left-sided facial numbness.  Given his history of cancer I am somewhat concerned for possible cranial nerve compression likely from his known laryngeal cancer.  Other things on the differential could be stroke, Bell's palsy, brain stem pathology. Will plan to get CTA head and neck with lab work.  Patient outside the window for acute stroke and LVO.  Patient still having some of that numbness.  All the labs are all reassuring.  He does have mild leukocytosis  but no other significant lab abnormalities.  CT angio head and neck did not reveal any significant signs of stroke or any airway occlusion.  Cancer seems to be improving based on CT read and progress notes on chart review.  Patient did have a brief episode of hypoxia but patient does have a history of COPD.  He states that this is normal for him and he is not actively having any shortness of breath.  He does not feel winded in any way.  Oxygenation did improve with 2 L of nasal cannula oxygen.  Again,  patient states he is currently at his baseline.  I will plan to discharge patient home with oncology follow-up and PCP follow-up.  Strict return precautions were discussed.  He is safe for discharge at this time.  Amount and/or Complexity of Data Reviewed Labs: ordered. Decision-making details documented in ED Course. Radiology: ordered. Decision-making details documented in ED Course.  Risk Prescription drug management.     Final diagnoses:  Facial paresthesia    ED Discharge Orders     None          Theotis Cameron HERO, [place] 09/05/24 2341    Gennaro Bouchard L, DO 09/07/24 1451  "

## 2024-09-05 NOTE — ED Triage Notes (Signed)
 Pt reports left sided facial numbness that started a week and half ago. Pt has hx of throat cancer. Speech is unchanged per pt but noted as garbled to clinical research associate.

## 2024-09-05 NOTE — Discharge Instructions (Signed)
 Please follow-up with your PCP and oncology.  Please return to the emergency department for any worsening symptoms.

## 2024-09-05 NOTE — ED Notes (Signed)
 Pt observed to having lower trending O2 Sats on room air while receiving evaluation and treatment in ED. Pts O2 Sats observed to remain at 88%-89%. Pt placed on 2L O2 via nasal cannula and O2 Sats improved to 91%. EDP Notified.

## 2024-09-06 LAB — I-STAT CHEM 8, ED
BUN: 8 mg/dL (ref 8–23)
Calcium, Ion: 1.23 mmol/L (ref 1.15–1.40)
Chloride: 100 mmol/L (ref 98–111)
Creatinine, Ser: 0.5 mg/dL — ABNORMAL LOW (ref 0.61–1.24)
Glucose, Bld: 104 mg/dL — ABNORMAL HIGH (ref 70–99)
HCT: 43 % (ref 39.0–52.0)
Hemoglobin: 14.6 g/dL (ref 13.0–17.0)
Potassium: 3.7 mmol/L (ref 3.5–5.1)
Sodium: 137 mmol/L (ref 135–145)
TCO2: 24 mmol/L (ref 22–32)

## 2024-09-10 ENCOUNTER — Encounter

## 2024-09-17 ENCOUNTER — Encounter: Admitting: Vascular Surgery

## 2024-11-26 ENCOUNTER — Ambulatory Visit: Payer: Self-pay | Admitting: Radiology
